# Patient Record
Sex: Female | Born: 1945 | Race: Black or African American | Hispanic: No | Smoking: Former smoker
Health system: Southern US, Community
[De-identification: ages and names within clinical notes are randomized; demographics above are authoritative.]

## PROBLEM LIST (undated history)

## (undated) DIAGNOSIS — H35039 Hypertensive retinopathy, unspecified eye: Secondary | ICD-10-CM

## (undated) DIAGNOSIS — C801 Malignant (primary) neoplasm, unspecified: Secondary | ICD-10-CM

## (undated) DIAGNOSIS — E11319 Type 2 diabetes mellitus with unspecified diabetic retinopathy without macular edema: Secondary | ICD-10-CM

## (undated) DIAGNOSIS — I639 Cerebral infarction, unspecified: Secondary | ICD-10-CM

## (undated) DIAGNOSIS — E119 Type 2 diabetes mellitus without complications: Secondary | ICD-10-CM

## (undated) DIAGNOSIS — C50919 Malignant neoplasm of unspecified site of unspecified female breast: Secondary | ICD-10-CM

## (undated) HISTORY — DX: Cerebral infarction, unspecified: I63.9

## (undated) HISTORY — PX: MASTECTOMY: SHX3

## (undated) HISTORY — PX: CATARACT EXTRACTION: SUR2

## (undated) HISTORY — DX: Type 2 diabetes mellitus with unspecified diabetic retinopathy without macular edema: E11.319

## (undated) HISTORY — DX: Malignant neoplasm of unspecified site of unspecified female breast: C50.919

## (undated) HISTORY — DX: Hypertensive retinopathy, unspecified eye: H35.039

## (undated) HISTORY — PX: EYE SURGERY: SHX253

## (undated) NOTE — *Deleted (*Deleted)
[facility] Note  10/31/2020     CHIEF COMPLAINT Patient presents for No chief complaint on file.   HISTORY OF PRESENT ILLNESS: Desiree Pollard is a 29 y.o. female who presents to the clinic today for:   pt states she had a laser procedure in her right eye for IOP at [facility] on 10.15.21   Referring physician: No referring provider defined for this encounter.  HISTORICAL INFORMATION:   Selected notes from the MEDICAL RECORD NUMBER Referred by Dr. Angelena Form for DM exam LEE: 02.04.20 (A. Hager) [BCVA: OD: 20/400 OS: 20/150] Ocular Hx-ptosis OU, pseudo OU, diabetic ret OU PMH-hx of stroke (1 month), cancer, DM    CURRENT MEDICATIONS: Current Outpatient Medications (Ophthalmic Drugs)  Medication Sig  . dorzolamide-timolol (COSOPT) 22.3-6.8 MG/ML ophthalmic solution INSTILL 1 DROP INTO BOTH EYES TWICE A DAY (Patient taking differently: Place 1 drop into the right eye 2 (two) times daily. )   No current facility-administered medications for this visit. (Ophthalmic Drugs)   Current Outpatient Medications (Other)  Medication Sig  . Accu-Chek FastClix Lancets MISC 1 each by Other route as directed. To test blood glucose daily.  Marland Kitchen atorvastatin (LIPITOR) 80 MG tablet Take 80 mg by mouth daily.   . Blood Glucose Monitoring Suppl (FIFTY50 GLUCOSE METER 2.0) w/Device KIT 1 each by Other route See admin instructions. Use to check blood sugars  . carvedilol (COREG) 25 MG tablet Take 25 mg by mouth 2 (two) times daily with a meal.   . clopidogrel (PLAVIX) 75 MG tablet Take 1 tablet (75 mg total) by mouth daily.  Marland Kitchen donepezil (ARICEPT) 10 MG tablet Take 10 mg by mouth at bedtime.  . Ferrous Sulfate (IRON PO) Take 1 tablet by mouth daily. 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  . ferrous sulfate 325 (65 FE) MG tablet Take 325 mg by mouth daily with breakfast.  . hydrALAZINE (APRESOLINE) 25 MG tablet Take 1 tablet (25 mg total) by mouth every 8 (eight) hours.  . insulin  aspart (NOVOLOG) 100 UNIT/ML injection 0-6 Units, Subcutaneous, 3 times daily with meals CBG < 70: Implement Hypoglycemia measures CBG 70 - 120: 0 units CBG 121 - 150: 0 units CBG 151 - 200: 1 unit CBG 201-250: 2 units CBG 251-300: 3 units CBG 301-350: 4 units CBG 351-400: 5 units CBG > 400: Give 6 units and call MD  . Insulin Pen Needle (FIFTY50 PEN NEEDLES) 32G X 4 MM MISC 1 each by Other route See admin instructions. Use as instructed.  . loperamide (IMODIUM A-D) 2 MG tablet Take 1 tablet (2 mg total) by mouth 4 (four) times daily as needed for diarrhea or loose stools. (Patient not taking: Reported on 10/19/2020)  . NIFEdipine (ADALAT CC) 60 MG 24 hr tablet Take 1 tablet (60 mg total) by mouth daily.  . pantoprazole (PROTONIX) 40 MG tablet Take 1 tablet (40 mg total) by mouth daily.   No current facility-administered medications for this visit. (Other)   REVIEW OF SYSTEMS:  ALLERGIES Allergies  Allergen Reactions  . Amlodipine Nausea And Vomiting    Pt refuses to take due to severe n/v that began after starting amlodipine and stopped after discontinuation.   PAST MEDICAL HISTORY Past Medical History:  Diagnosis Date  . Breast cancer ([place])    2017  . CVA (cerebral vascular accident) ([place]) 11/2018  . Dehydration 07/2020  . Diabetes mellitus without complication ([place])   . Diabetic retinopathy ([place])    PDR OU  .  Frequent diarrhea 07/2020  . Hyperkalemia 07/2020  . Hypertensive retinopathy    OU  . Stroke [facility])    Past Surgical History:  Procedure Laterality Date  . CATARACT EXTRACTION Right   . MASTECTOMY Bilateral    "2017" Left Mastectomy; "2019" Right Mastectomy  . PACEMAKER IMPLANT  2017   FAMILY HISTORY Family History  Problem Relation Age of Onset  . Ovarian cancer Mother   . Heart disease Father   . Diabetes Sister   . Diabetes Brother   . Colon cancer Neg Hx   . Esophageal cancer Neg Hx   . Pancreatic cancer Neg Hx   . Stomach cancer Neg Hx   . Liver  disease Neg Hx    SOCIAL HISTORY Social History   Tobacco Use  . Smoking status: Former Research scientist (life sciences)  . Smokeless tobacco: Never Used  Vaping Use  . Vaping Use: Never used  Substance Use Topics  . Alcohol use: Not Currently  . Drug use: Never         OPHTHALMIC EXAM:  Not recorded     IMAGING AND PROCEDURES  Imaging and Procedures for @TODAY @           ASSESSMENT/PLAN:    ICD-10-CM   1. Proliferative diabetic retinopathy of both eyes with macular edema associated with type 2 diabetes mellitus ([place])  [id number]   2. Retinal edema  H35.81   3. 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 Essential hypertension  I10   5. Hypertensive retinopathy of both eyes  H35.033   6. Pseudophakia of both eyes  Z96.1   7. History of stroke  Z86.73   8. Primary open angle glaucoma (POAG) of right eye, severe stage  H40.1113   9. Ocular hypertension of left eye  H40.052     1,2. Proliferative diabetic retinopathy w/ DME OU  - lost to f/u from 11/2019 to 06/2020 due to living out of the area in [place] and unable to return  - lost to f/u 3.23.2020 to 7.13.2020 due to [place] restrictions  - history of VH OD onset 2.25.20 -- improved  - at initial presentation - difficult exam due to poor patient cooperation -- history of stroke -- improved now  - initial exam showed central edema OU and scattered IRH; +fine NVD OS; early flat NVE OD  - FA (08.19.20) shows NV nasal to disc OD; OS with NV along SN and ST arcades  - s/p PRP OS (02.18.20), fill-in (08.19.20)  - s/p PRP OD (03.23.30), fill-in (09.16.20)  - s/p IVTA OU #1 (02.25.20), #2 (03.23.20) -- lost to f/u due to COVID restrictions  - s/p IVA OU #1 (07.31.20), #2 (09.02.20), #3 (10.07.20), #4 (11.04.20)             - s/p IVA OS #5 (7.27.21)             - s/p IVE OD #1 (12.02.20), #2 (09.22.21), #3 (10.21.21)  - s/p IVE OS #1 (12.02.21), #2 (08.24.21), #3 (09.22.21), #4 (10.21.21)   - IOP 23 OD, 13 OS today -- on  Cosopt and brimonidine TID OD + latanoprost QHS OD  - BCVA: decreased to LP from CF OD, improved to 20/100 from 20/150 OS -- suspect glaucomatous vision loss (OD disc w/ severe pallor)  - OCT shows interval increase in central IRF OD, OS: Interval improvement in subhyaloid heme and vitreous opacities; persistent central vitreous traction  - recommend IVE OD #4 and OS #5 today, 11.18.21  -  pt wishes to proceed  - RBA of procedure discussed, questions answered  - informed consent obtained and signed  - see procedure note -- AC tap OD  - [employer] benefits investigation started 11.4.20 -- approved for 2021  - f/u 4 weeks, DFE, OCT, possible injection  3. VMT OS  - stable VMT, contributing to central edema OS  - monitor  4,5. Hypertensive retinopathy OU  - discussed importance of tight BP control and its relation to 1,2 above  - monitor  6. Pseudophakia OU  - s/p CE/IOL OU  - doing well  - monitor  7. History of stroke  - ischemic stroke on 12/26/2018  - pt reports some visual decline following stroke   8,9. POAG, severe stage OD; Ocular Hypertension OS  - history of steroid response  - under the expert management of [place]  - s/p SLT OD at [facility] 10.15.21  - IOP good at 17 OD today, 10.22.21  - disc OD pale/atrophic and BCVA down to LP  - suspect glaucomatous vision loss since last visit in 2020   - continue Cosopt and brimonidine BID OD  Ophthalmic Meds Ordered this visit:  No orders of the defined types were placed in this encounter.    No follow-ups on file.  There are no Patient Instructions on file for this visit.  Explained the diagnoses, plan, and follow up with the patient and they expressed understanding.  Patient expressed understanding of the importance of proper follow up care.   This document serves as a record of services personally performed by Gardiner Sleeper, MD, PhD. It was created on their behalf by Leonie Douglas, an ophthalmic technician.  The creation of this record is the provider's dictation and/or activities during the visit.    Electronically signed by: Leonie Douglas COA, 10/30/20  10:27 AM   Gardiner Sleeper, M.D., Ph.D. Diseases & Surgery of the Retina and Vitreous [facility]: M myopia (nearsighted); A astigmatism; H hyperopia (farsighted); P presbyopia; Mrx spectacle prescription;  CTL contact lenses; OD right eye; OS left eye; OU both eyes  XT exotropia; ET esotropia; PEK punctate epithelial keratitis; PEE punctate epithelial erosions; DES dry eye syndrome; MGD meibomian gland dysfunction; ATs artificial tears; PFAT's preservative free artificial tears; [place] nuclear sclerotic cataract; PSC posterior subcapsular cataract; ERM epi-retinal membrane; PVD posterior vitreous detachment; RD retinal detachment; DM diabetes mellitus; DR diabetic retinopathy; NPDR non-proliferative diabetic retinopathy; PDR proliferative diabetic retinopathy; CSME clinically significant macular edema; DME diabetic macular edema; dbh dot blot hemorrhages; CWS cotton wool spot; POAG primary open angle glaucoma; C/D cup-to-disc ratio; HVF humphrey visual field; GVF goldmann visual field; OCT optical coherence tomography; IOP intraocular pressure; BRVO Branch retinal vein occlusion; CRVO central retinal vein occlusion; CRAO central retinal artery occlusion; BRAO branch retinal artery occlusion; RT retinal tear; SB scleral buckle; PPV pars plana vitrectomy; VH Vitreous hemorrhage; PRP panretinal laser photocoagulation; IVK intravitreal kenalog; VMT vitreomacular traction; MH Macular hole;  NVD neovascularization of the disc; NVE neovascularization elsewhere; AREDS age related eye disease study; ARMD age related macular degeneration; POAG primary open angle glaucoma; EBMD epithelial/anterior basement membrane dystrophy; ACIOL anterior chamber intraocular lens; IOL intraocular lens; PCIOL posterior chamber intraocular lens;  Phaco/IOL phacoemulsification with intraocular lens placement; [place] photorefractive keratectomy; LASIK laser assisted in situ keratomileusis; HTN hypertension; DM diabetes mellitus; COPD chronic obstructive pulmonary disease

## (undated) NOTE — *Deleted (*Deleted)
[facility] Note  10/31/2020     CHIEF COMPLAINT Patient presents for No chief complaint on file.   HISTORY OF PRESENT ILLNESS: Desiree Pollard is a 85 y.o. female who presents to the clinic today for:   pt states she saw Dr. Katy Fitch on September 1 for glaucoma eval, pt states he is going to refer her to [facility] for surgery, but the appt is not until November 30, pt states her right eye is not painful  Referring physician: No referring provider defined for this encounter.  HISTORICAL INFORMATION:   Selected notes from the MEDICAL RECORD NUMBER Referred by Dr. Angelena Form for DM exam LEE: 02.04.20 (A. Hager) [BCVA: OD: 20/400 OS: 20/150] Ocular Hx-ptosis OU, pseudo OU, diabetic ret OU PMH-hx of stroke (1 month), cancer, DM    CURRENT MEDICATIONS: Current Outpatient Medications (Ophthalmic Drugs)  Medication Sig  . dorzolamide-timolol (COSOPT) 22.3-6.8 MG/ML ophthalmic solution INSTILL 1 DROP INTO BOTH EYES TWICE A DAY (Patient taking differently: Place 1 drop into the right eye 2 (two) times daily. )   No current facility-administered medications for this visit. (Ophthalmic Drugs)   Current Outpatient Medications (Other)  Medication Sig  . Accu-Chek FastClix Lancets MISC 1 each by Other route as directed. To test blood glucose daily.  Marland Kitchen atorvastatin (LIPITOR) 80 MG tablet Take 80 mg by mouth daily.   . Blood Glucose Monitoring Suppl (FIFTY50 GLUCOSE METER 2.0) w/Device KIT 1 each by Other route See admin instructions. Use to check blood sugars  . carvedilol (COREG) 25 MG tablet Take 25 mg by mouth 2 (two) times daily with a meal.   . clopidogrel (PLAVIX) 75 MG tablet Take 1 tablet (75 mg total) by mouth daily.  Marland Kitchen donepezil (ARICEPT) 10 MG tablet Take 10 mg by mouth at bedtime.  . Ferrous Sulfate (IRON PO) Take 1 tablet by mouth daily. 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  . ferrous sulfate 325 (65 FE) MG tablet Take 325 mg by mouth daily with breakfast.  . hydrALAZINE  (APRESOLINE) 25 MG tablet Take 1 tablet (25 mg total) by mouth every 8 (eight) hours.  . insulin aspart (NOVOLOG) 100 UNIT/ML injection 0-6 Units, Subcutaneous, 3 times daily with meals CBG < 70: Implement Hypoglycemia measures CBG 70 - 120: 0 units CBG 121 - 150: 0 units CBG 151 - 200: 1 unit CBG 201-250: 2 units CBG 251-300: 3 units CBG 301-350: 4 units CBG 351-400: 5 units CBG > 400: Give 6 units and call MD  . Insulin Pen Needle (FIFTY50 PEN NEEDLES) 32G X 4 MM MISC 1 each by Other route See admin instructions. Use as instructed.  . loperamide (IMODIUM A-D) 2 MG tablet Take 1 tablet (2 mg total) by mouth 4 (four) times daily as needed for diarrhea or loose stools. (Patient not taking: Reported on 10/19/2020)  . NIFEdipine (ADALAT CC) 60 MG 24 hr tablet Take 1 tablet (60 mg total) by mouth daily.  . pantoprazole (PROTONIX) 40 MG tablet Take 1 tablet (40 mg total) by mouth daily.   No current facility-administered medications for this visit. (Other)   REVIEW OF SYSTEMS:  ALLERGIES Allergies  Allergen Reactions  . Amlodipine Nausea And Vomiting    Pt refuses to take due to severe n/v that began after starting amlodipine and stopped after discontinuation.   PAST MEDICAL HISTORY Past Medical History:  Diagnosis Date  . Breast cancer ([place])    2017  . CVA (cerebral vascular accident) ([place]) 11/2018  . Dehydration  07/2020  . Diabetes mellitus without complication ([place])   . Diabetic retinopathy ([place])    PDR OU  . Frequent diarrhea 07/2020  . Hyperkalemia 07/2020  . Hypertensive retinopathy    OU  . Stroke [facility])    Past Surgical History:  Procedure Laterality Date  . CATARACT EXTRACTION Right   . MASTECTOMY Bilateral    "2017" Left Mastectomy; "2019" Right Mastectomy  . PACEMAKER IMPLANT  2017   FAMILY HISTORY Family History  Problem Relation Age of Onset  . Ovarian cancer Mother   . Heart disease Father   . Diabetes Sister   . Diabetes Brother   . Colon cancer Neg Hx    . Esophageal cancer Neg Hx   . Pancreatic cancer Neg Hx   . Stomach cancer Neg Hx   . Liver disease Neg Hx    SOCIAL HISTORY Social History   Tobacco Use  . Smoking status: Former Research scientist (life sciences)  . Smokeless tobacco: Never Used  Vaping Use  . Vaping Use: Never used  Substance Use Topics  . Alcohol use: Not Currently  . Drug use: Never         OPHTHALMIC EXAM:  Not recorded     IMAGING AND PROCEDURES  Imaging and Procedures for @TODAY @           ASSESSMENT/PLAN:  No diagnosis found.  1,2. Proliferative diabetic retinopathy w/ DME OU  - lost to f/u from 11/2019 to 06/2020 due to living out of the area in [place] and unable to return  - lost to f/u 3.23.2020 to 7.13.2020 due to [place] restrictions  - history of VH OD onset 2.25.20 -- improved  - at initial presentation - difficult exam due to poor patient cooperation -- history of stroke -- improved now  - initial exam showed central edema OU and scattered IRH; +fine NVD OS; early flat NVE OD  - FA (08.19.20) shows NV nasal to disc OD; OS with NV along SN and ST arcades  - s/p PRP OS (02.18.20), fill-in (08.19.20)  - s/p PRP OD (03.23.30), fill-in (09.16.20)  - s/p IVTA OU #1 (02.25.20), #2 (03.23.20) -- lost to f/u due to COVID restrictions  - s/p IVA OU #1 (07.31.20), #2 (09.02.20), #3 (10.07.20), #4 (11.04.20)             - s/p IVA OS #5 (7.27.21)             - s/p IVE OU #1 (12.02.20)  - s/p IVE OS #2 (08.24.21)  - IOP 23 OD, 13 OS today -- on Cosopt and brimonidine TID OD + latanoprost QHS OD  - BCVA: decreased to LP from CF OD, decreased to 20/150-2 from 20/80 OS -- suspect glaucomatous vision loss (OD disc w/ severe pallor)  - OCT shows interval increase in central IRF OD, OS: Interval improvement in subhyaloid heme and IRF; persistent central vitreous traction  - recommend IVE OD #2 and OS #3 today, 09.22.21  - pt wishes to proceed  - RBA of procedure discussed, questions answered  - informed  consent obtained and signed  - see procedure note -- AC tap OD  - [employer] benefits investigation started 11.4.20 -- approved for 2021  - f/u 4 weeks, DFE, OCT, possible injection  3. VMT OS  - stable VMT, contributing to central edema OS  - monitor  4,5. Hypertensive retinopathy OU  - discussed importance of tight BP control and its relation to 1,2 above  - monitor  6. Pseudophakia  OU  - s/p CE/IOL OU  - doing well  - monitor  7. History of stroke  - ischemic stroke on 12/26/2018  - pt reports some visual decline following stroke  8. Ocular Hypertension OU  - history of steroid response  - IOP elevated to 23 OD today, 09.22.21  - disc OD pale/atrophic and BCVA down to LP  - suspect glaucomatous vision loss since last visit in 2020 -- pt has not been on any pressure lowering drops  - restarted Cosopt and brimonidine TID OD, added latanoprost QHS OD  - now under the expert management of [place] -- referral made to Dr. Sharen Counter at [place] Ordered this visit:  No orders of the defined types were placed in this encounter.    No follow-ups on file.  There are no Patient Instructions on file for this visit.  Explained the diagnoses, plan, and follow up with the patient and they expressed understanding.  Patient expressed understanding of the importance of proper follow up care.   This document serves as a record of services personally performed by Gardiner Sleeper, MD, PhD. It was created on their behalf by Roselee Nova, COMT. The creation of this record is the provider's dictation and/or activities during the visit.  Electronically signed by: Roselee Nova, COMT 10/30/20 10:24 AM  This document serves as a record of services personally performed by Gardiner Sleeper, MD, PhD. It was created on their behalf by [place] Jet[place]. Owens Shark, OA an ophthalmic technician. The creation of this record is the provider's dictation and/or activities during the visit.    Electronically  signed by: San Jetty. Owens Shark, [place] 09.22.2021 10:24 AM   Gardiner Sleeper, M.D., Ph.D. Diseases & Surgery of the Retina and Vitreous [facility]  I have reviewed the above documentation for accuracy and completeness, and I agree with the above. Gardiner Sleeper, M.D., Ph.D. 09/04/20 10:24 AM   Abbreviations: M myopia (nearsighted); A astigmatism; H hyperopia (farsighted); P presbyopia; Mrx spectacle prescription;  CTL contact lenses; OD right eye; OS left eye; OU both eyes  XT exotropia; ET esotropia; PEK punctate epithelial keratitis; PEE punctate epithelial erosions; DES dry eye syndrome; MGD meibomian gland dysfunction; ATs artificial tears; PFAT's preservative free artificial tears; [place] nuclear sclerotic cataract; PSC posterior subcapsular cataract; ERM epi-retinal membrane; PVD posterior vitreous detachment; RD retinal detachment; DM diabetes mellitus; DR diabetic retinopathy; NPDR non-proliferative diabetic retinopathy; PDR proliferative diabetic retinopathy; CSME clinically significant macular edema; DME diabetic macular edema; dbh dot blot hemorrhages; CWS cotton wool spot; POAG primary open angle glaucoma; C/D cup-to-disc ratio; HVF humphrey visual field; GVF goldmann visual field; OCT optical coherence tomography; IOP intraocular pressure; BRVO Branch retinal vein occlusion; CRVO central retinal vein occlusion; CRAO central retinal artery occlusion; BRAO branch retinal artery occlusion; RT retinal tear; SB scleral buckle; PPV pars plana vitrectomy; VH Vitreous hemorrhage; PRP panretinal laser photocoagulation; IVK intravitreal kenalog; VMT vitreomacular traction; MH Macular hole;  NVD neovascularization of the disc; NVE neovascularization elsewhere; AREDS age related eye disease study; ARMD age related macular degeneration; POAG primary open angle glaucoma; EBMD epithelial/anterior basement membrane dystrophy; ACIOL anterior chamber intraocular lens; IOL intraocular lens; PCIOL  posterior chamber intraocular lens; Phaco/IOL phacoemulsification with intraocular lens placement; [place] photorefractive keratectomy; LASIK laser assisted in situ keratomileusis; HTN hypertension; DM diabetes mellitus; COPD chronic obstructive pulmonary disease

---

## 2015-12-15 HISTORY — PX: PACEMAKER IMPLANT: EP1218

## 2018-11-13 DIAGNOSIS — I639 Cerebral infarction, unspecified: Secondary | ICD-10-CM

## 2018-11-13 HISTORY — DX: Cerebral infarction, unspecified: I63.9

## 2018-12-30 ENCOUNTER — Emergency Department (HOSPITAL_COMMUNITY)
Admission: EM | Admit: 2018-12-30 | Discharge: 2018-12-31 | Disposition: A | Discharge status: Home / Self Care | Payer: Medicare Other | Attending: Emergency Medicine | Admitting: Emergency Medicine

## 2018-12-30 ENCOUNTER — Other Ambulatory Visit: Payer: Self-pay

## 2018-12-30 ENCOUNTER — Emergency Department (HOSPITAL_COMMUNITY): Payer: Medicare Other

## 2018-12-30 ENCOUNTER — Encounter (HOSPITAL_COMMUNITY): Payer: Self-pay

## 2018-12-30 DIAGNOSIS — E1122 Type 2 diabetes mellitus with diabetic chronic kidney disease: Secondary | ICD-10-CM | POA: Insufficient documentation

## 2018-12-30 DIAGNOSIS — I5032 Chronic diastolic (congestive) heart failure: Secondary | ICD-10-CM | POA: Insufficient documentation

## 2018-12-30 DIAGNOSIS — R278 Other lack of coordination: Secondary | ICD-10-CM | POA: Insufficient documentation

## 2018-12-30 DIAGNOSIS — Z8673 Personal history of transient ischemic attack (TIA), and cerebral infarction without residual deficits: Secondary | ICD-10-CM | POA: Diagnosis not present

## 2018-12-30 DIAGNOSIS — Z950 Presence of cardiac pacemaker: Secondary | ICD-10-CM | POA: Diagnosis not present

## 2018-12-30 DIAGNOSIS — R112 Nausea with vomiting, unspecified: Secondary | ICD-10-CM | POA: Insufficient documentation

## 2018-12-30 DIAGNOSIS — N189 Chronic kidney disease, unspecified: Secondary | ICD-10-CM | POA: Insufficient documentation

## 2018-12-30 DIAGNOSIS — H5500 Unspecified nystagmus: Secondary | ICD-10-CM | POA: Diagnosis not present

## 2018-12-30 DIAGNOSIS — R42 Dizziness and giddiness: Secondary | ICD-10-CM | POA: Insufficient documentation

## 2018-12-30 DIAGNOSIS — I130 Hypertensive heart and chronic kidney disease with heart failure and stage 1 through stage 4 chronic kidney disease, or unspecified chronic kidney disease: Secondary | ICD-10-CM | POA: Diagnosis not present

## 2018-12-30 DIAGNOSIS — Z9013 Acquired absence of bilateral breasts and nipples: Secondary | ICD-10-CM | POA: Insufficient documentation

## 2018-12-30 HISTORY — DX: Type 2 diabetes mellitus without complications: E11.9

## 2018-12-30 HISTORY — DX: Cerebral infarction, unspecified: I63.9

## 2018-12-30 HISTORY — DX: Malignant (primary) neoplasm, unspecified: C80.1

## 2018-12-30 LAB — COMPREHENSIVE METABOLIC PANEL
ALT: 15 U/L (ref 0–44)
AST: 16 U/L (ref 15–41)
Albumin: 2.2 g/dL — ABNORMAL LOW (ref 3.5–5.0)
Alkaline Phosphatase: 73 U/L (ref 38–126)
Anion gap: 11 (ref 5–15)
BUN: 13 mg/dL (ref 8–23)
CHLORIDE: 108 mmol/L (ref 98–111)
CO2: 26 mmol/L (ref 22–32)
Calcium: 9.2 mg/dL (ref 8.9–10.3)
Creatinine, Ser: 1.61 mg/dL — ABNORMAL HIGH (ref 0.44–1.00)
GFR calc Af Amer: 36 mL/min — ABNORMAL LOW (ref >60)
GFR, EST NON AFRICAN AMERICAN: 31 mL/min — AB (ref >60)
Glucose, Bld: 164 mg/dL — ABNORMAL HIGH (ref 70–99)
Potassium: 3.7 mmol/L (ref 3.5–5.1)
Sodium: 145 mmol/L (ref 135–145)
Total Bilirubin: 0.4 mg/dL (ref 0.3–1.2)
Total Protein: 6.7 g/dL (ref 6.5–8.1)

## 2018-12-30 LAB — CBC WITH DIFFERENTIAL/PLATELET
Abs Immature Granulocytes: 0.02 10*3/uL (ref 0.00–0.07)
Basophils Absolute: 0 10*3/uL (ref 0.0–0.1)
Basophils Relative: 0 %
Eosinophils Absolute: 0.1 10*3/uL (ref 0.0–0.5)
Eosinophils Relative: 3 %
HCT: 35.3 % — ABNORMAL LOW (ref 36.0–46.0)
Hemoglobin: 10.5 g/dL — ABNORMAL LOW (ref 12.0–15.0)
Immature Granulocytes: 0 %
Lymphocytes Relative: 19 %
Lymphs Abs: 0.9 10*3/uL (ref 0.7–4.0)
MCH: 24.6 pg — ABNORMAL LOW (ref 26.0–34.0)
MCHC: 29.7 g/dL — ABNORMAL LOW (ref 30.0–36.0)
MCV: 82.9 fL (ref 80.0–100.0)
Monocytes Absolute: 0.4 10*3/uL (ref 0.1–1.0)
Monocytes Relative: 9 %
NEUTROS PCT: 69 %
Neutro Abs: 3.2 10*3/uL (ref 1.7–7.7)
PLATELETS: 242 10*3/uL (ref 150–400)
RBC: 4.26 MIL/uL (ref 3.87–5.11)
RDW: 13.7 % (ref 11.5–15.5)
WBC: 4.7 10*3/uL (ref 4.0–10.5)
nRBC: 0 % (ref 0.0–0.2)

## 2018-12-30 LAB — MAGNESIUM: MAGNESIUM: 2.2 mg/dL (ref 1.7–2.4)

## 2018-12-30 LAB — I-STAT TROPONIN, ED: Troponin i, poc: 0.02 ng/mL (ref 0.00–0.08)

## 2018-12-30 LAB — LIPASE, BLOOD: LIPASE: 25 U/L (ref 11–51)

## 2018-12-30 IMAGING — CT CT HEAD W/O CM
4 series · 15 of 47 positions shown, 17 images · non-contrast
Comparison: CT head [DATE]

CLINICAL DATA: Focal neuro deficit suspect stroke.  Recent stroke.

EXAM:
CT HEAD WITHOUT CONTRAST
TECHNIQUE: Contiguous axial images were obtained from the base of the skull
through the vertex without intravenous contrast.

[Series 3: head without · axial · non-contrast · 0.46mm/px · z∈[-125,+0]mm · 7 of 35 slices shown, 9 images]
[im 5/35  brain]
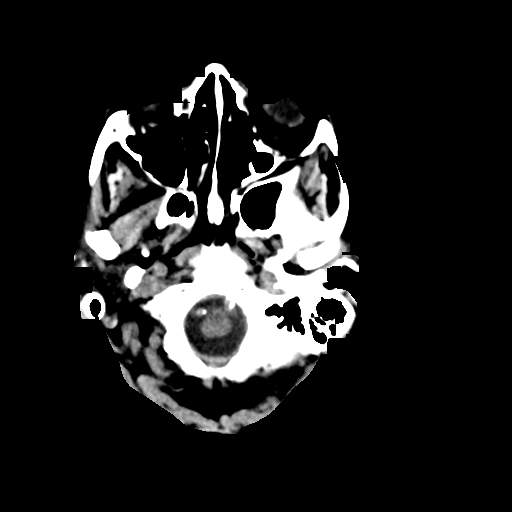
[im 5/35  bone]
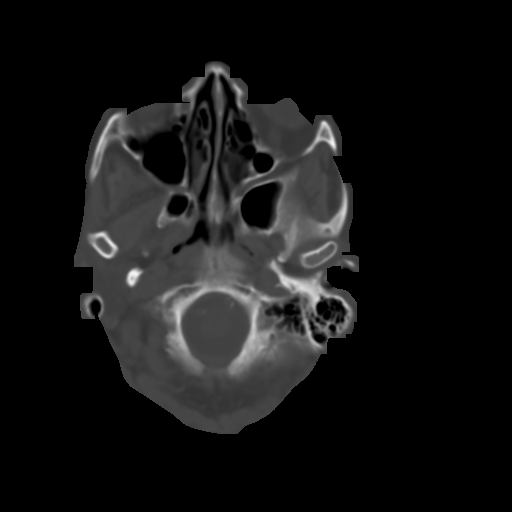
[im 9/35  brain]
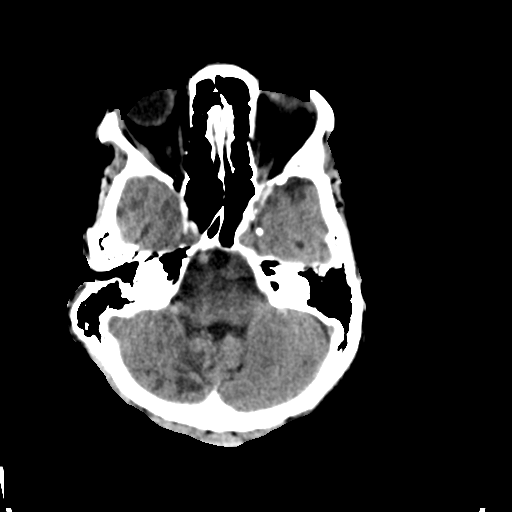
[im 13/35  brain]
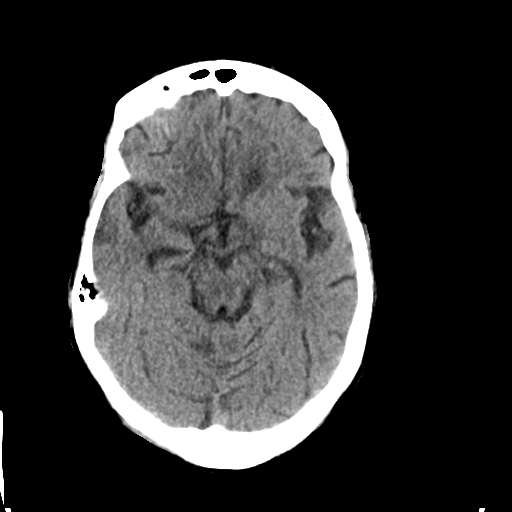
[im 18/35  brain]
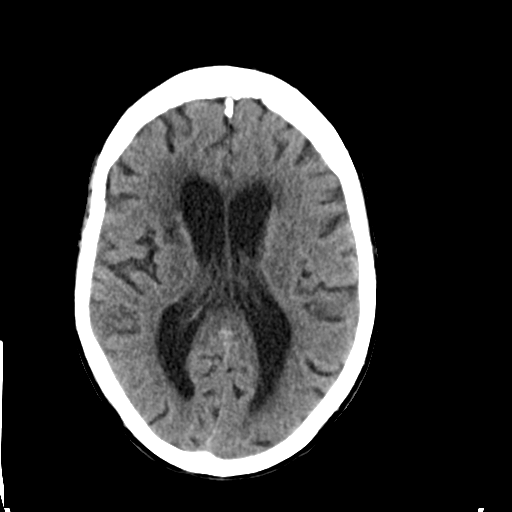
[im 22/35  brain]
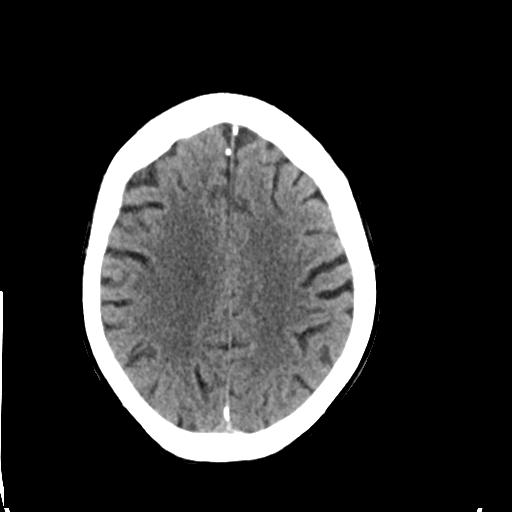
[im 22/35  bone]
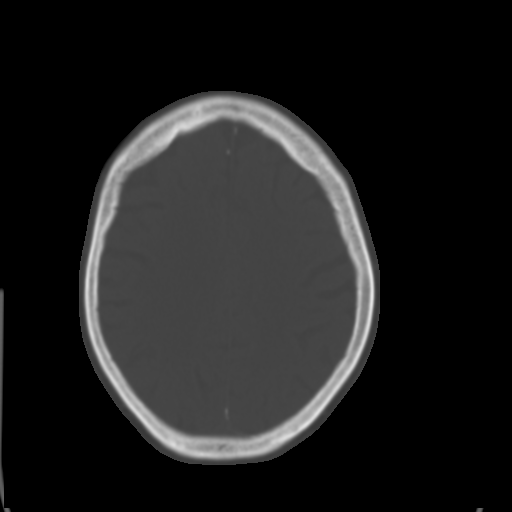
[im 26/35  brain]
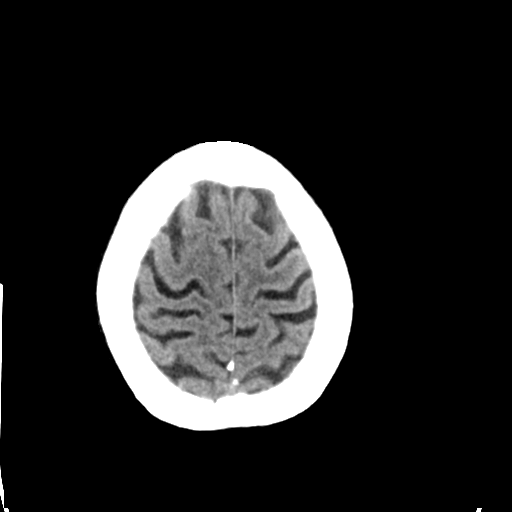
[im 30/35  brain]
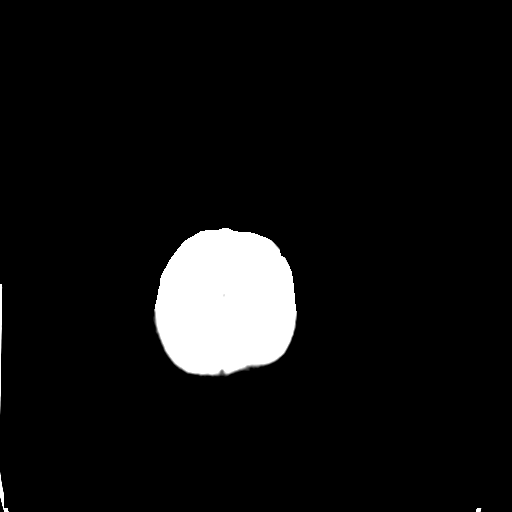

[Series 4: head bone · axial · 0.46mm/px · z∈[-129,-111]mm · 2 of 86 slices shown]
[im 9/86  bone]
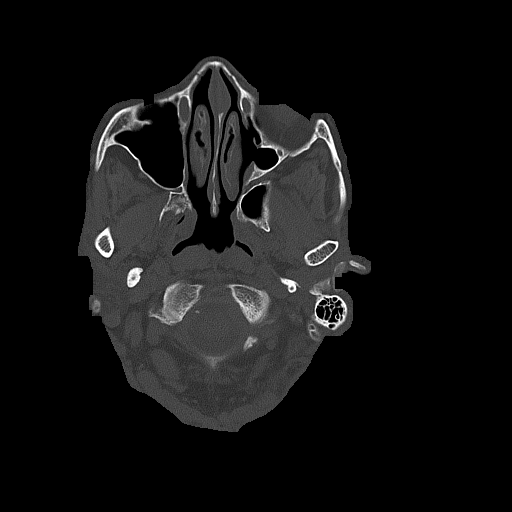
[im 18/86  bone]
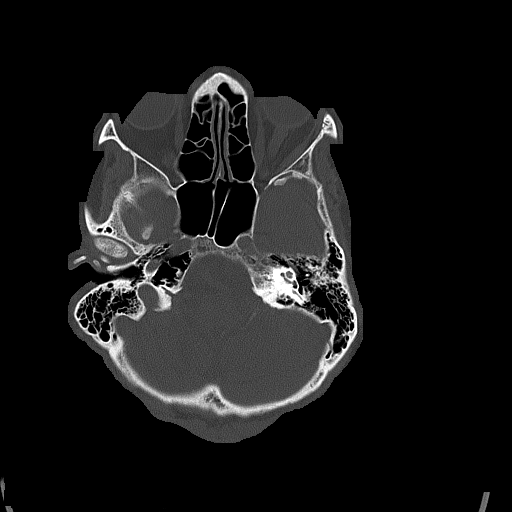

[Series 5: head without cor · coronal · non-contrast · 0.33mm/px · 3 of 84 slices shown]
[im 28/84  brain]
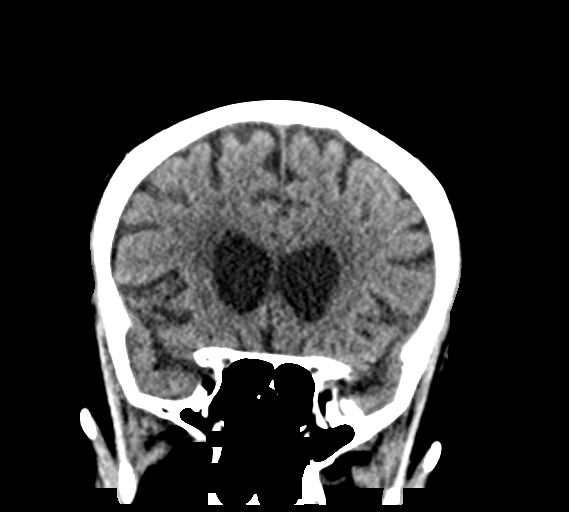
[im 37/84  brain]
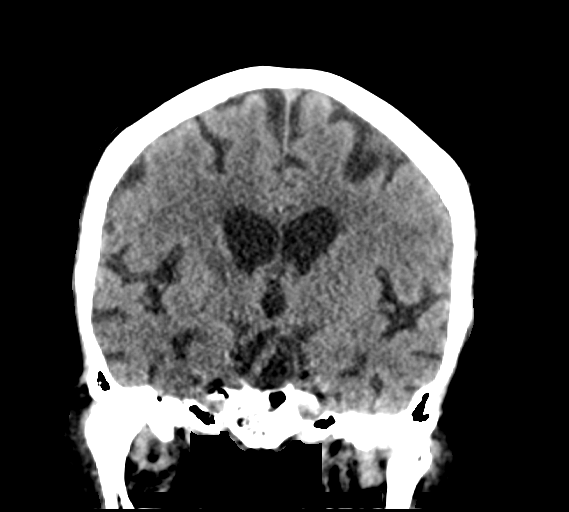
[im 47/84  brain]
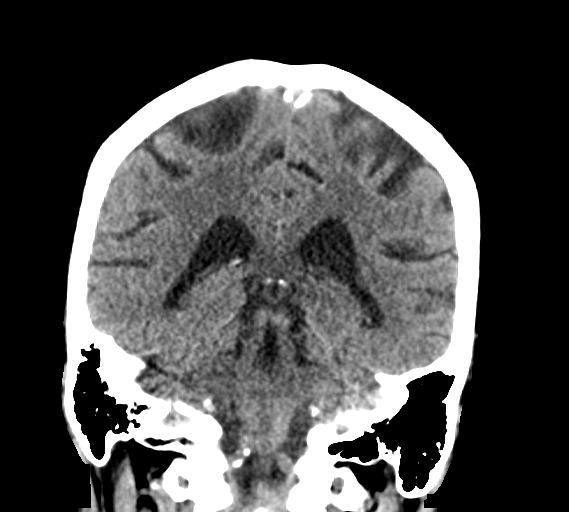

[Series 6: head without sag · sagittal · non-contrast · 0.33mm/px · 3 of 62 slices shown]
[im 21/62  brain]
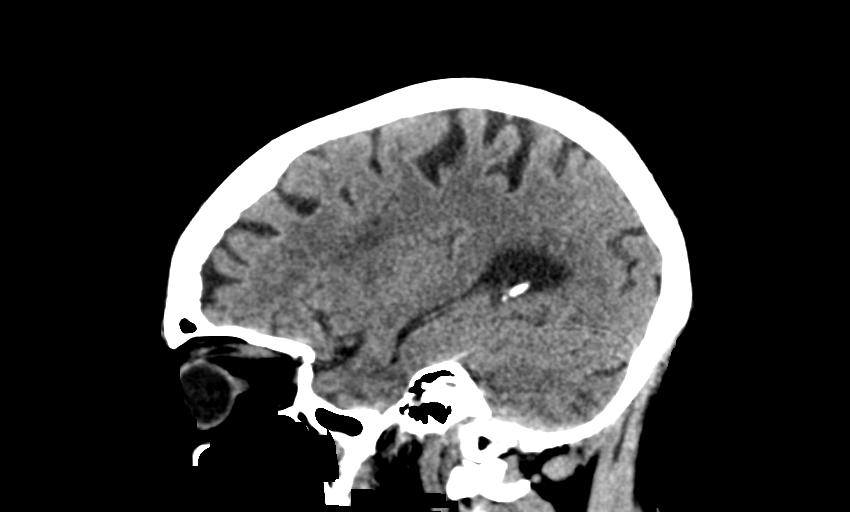
[im 31/62  brain]
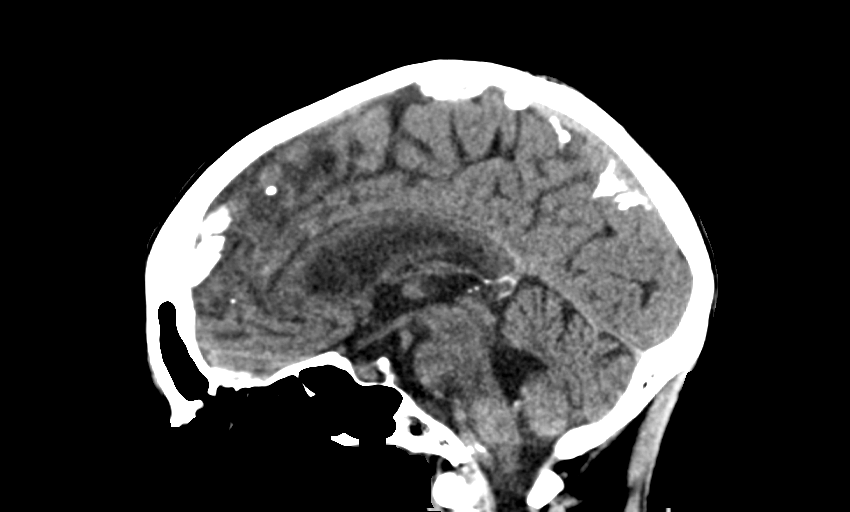
[im 41/62  brain]
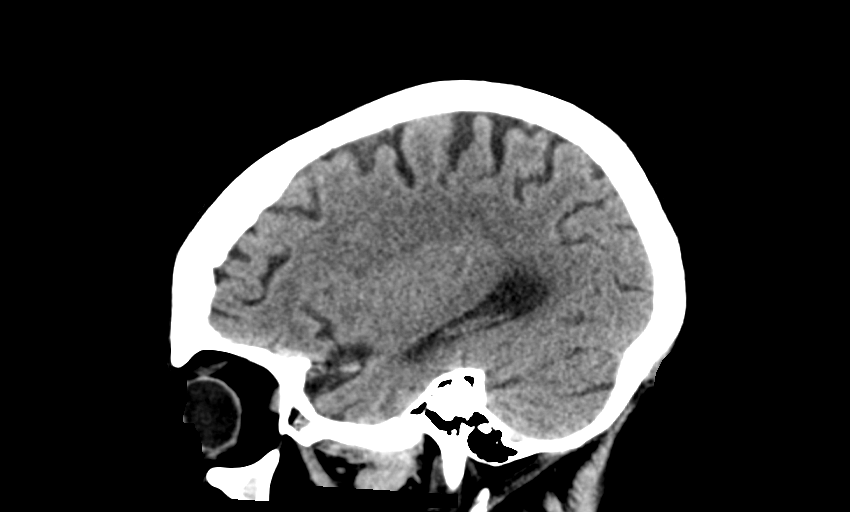

[15 of 47 positions shown; findings below may reference images not displayed]

FINDINGS: Brain: Generalized atrophy. Chronic infarcts in the right basal
ganglia and internal capsule. Chronic infarcts in the cerebellum
bilaterally. Right inferior cerebellar infarct may be recent based
on the prior study.

Negative for acute infarct.  Negative for hemorrhage or mass.

Vascular: Negative for hyperdense vessel

Skull: Negative

Sinuses/Orbits: Chronic mucosal thickening and bony thickening left
maxillary sinus. Bilateral cataract surgery.

Other: None
IMPRESSION: Atrophy and chronic ischemic changes. No acute abnormality. Possible
recent infarct right inferior cerebellum was present on the prior
study.

## 2018-12-30 MED ORDER — INSULIN REGULAR HUMAN 100 UNIT/ML IJ SOLN
0.00 | INTRAMUSCULAR | Status: DC
Start: 2018-12-28 — End: 2018-12-30

## 2018-12-30 MED ORDER — CARVEDILOL 25 MG PO TABS
25.00 | ORAL_TABLET | ORAL | Status: DC
Start: 2018-12-28 — End: 2018-12-30

## 2018-12-30 MED ORDER — SODIUM CHLORIDE 0.9 % IV BOLUS
500.0000 mL | Freq: Once | INTRAVENOUS | Status: AC
  Administered 2018-12-30: 500 mL via INTRAVENOUS

## 2018-12-30 MED ORDER — ENOXAPARIN SODIUM 40 MG/0.4ML ~~LOC~~ SOLN
40.00 | SUBCUTANEOUS | Status: DC
Start: 2018-12-29 — End: 2018-12-30

## 2018-12-30 MED ORDER — FERROUS SULFATE 325 (65 FE) MG PO TABS
325.00 | ORAL_TABLET | ORAL | Status: DC
Start: 2018-12-29 — End: 2018-12-30

## 2018-12-30 MED ORDER — AMLODIPINE BESYLATE 10 MG PO TABS
10.00 | ORAL_TABLET | ORAL | Status: DC
Start: 2018-12-29 — End: 2018-12-30

## 2018-12-30 MED ORDER — MECLIZINE HCL 25 MG PO TABS: 25.0000 mg | ORAL_TABLET | Freq: Three times a day (TID) | ORAL | 0 refills | Status: DC | PRN

## 2018-12-30 MED ORDER — DEXTROSE 50 % IV SOLN
12.50 | INTRAVENOUS | Status: DC
Start: ? — End: 2018-12-30

## 2018-12-30 MED ORDER — INSULIN NPH ISOPHANE & REGULAR (70-30) 100 UNIT/ML ~~LOC~~ SUSP
5.00 | SUBCUTANEOUS | Status: DC
Start: 2018-12-28 — End: 2018-12-30

## 2018-12-30 MED ORDER — ASPIRIN EC 81 MG PO TBEC
81.00 | DELAYED_RELEASE_TABLET | ORAL | Status: DC
Start: 2018-12-29 — End: 2018-12-30

## 2018-12-30 MED ORDER — ATORVASTATIN CALCIUM 40 MG PO TABS
80.00 | ORAL_TABLET | ORAL | Status: DC
Start: 2018-12-28 — End: 2018-12-30

## 2018-12-30 MED ORDER — MECLIZINE HCL 25 MG PO TABS
25.0000 mg | ORAL_TABLET | Freq: Once | ORAL | Status: AC
  Administered 2018-12-30: 25 mg via ORAL
  Filled 2018-12-30: qty 1

## 2018-12-30 MED ORDER — DONEPEZIL HCL 10 MG PO TABS
10.00 | ORAL_TABLET | ORAL | Status: DC
Start: 2018-12-28 — End: 2018-12-30

## 2018-12-30 NOTE — ED Provider Notes (Signed)
[place] EMERGENCY DEPARTMENT Provider Note   CSN: [id number] Arrival date & time: 12/30/18  1704     History   Chief Complaint Chief Complaint  Patient presents with  . Nausea  . Emesis    HPI Desiree Pollard is a 73 y.o. female.  HPI  Desiree Pollard is a 73 y.o. female with PMH of diabetes, chronic diastolic heart failure, hypertension, CKD, anemia who presents with her daughter at the bedside from skilled nursing facility after recent admission to [facility] for stroke.  She reports she has had persistent nausea and vomiting.  Patient presented to [facility] on December 22 with nausea and vomiting as well as confusion.  Daughter reports she was later found to have a CVA and was moved to her skilled nursing facility nearby.  Had decreased LOC at times and nausea and vomiting and was then moved back to the hospital.  Patient's daughter reports that she has had almost daily nausea and nonbloody nonbilious emesis although has been able to tolerate foods at other times since December 22 and 23.  She reports having no pain but does have nausea when she feels lightheaded.  On further discussion she states that she has room spinning sensation often spontaneously as well as when moving.  If she lays down and does not move it does improve sometimes.  At this time, she feels that she has had no new symptoms since her stroke and those symptoms have been stable.  Past Medical History:  Diagnosis Date  . Cancer ([place])   . Diabetes mellitus without complication ([place])   . Stroke [facility])     There are no active problems to display for this patient.   Past Surgical History:  Procedure Laterality Date  . MASTECTOMY Bilateral      OB History   No obstetric history on file.      Home Medications    Prior to Admission medications   Medication Sig Start Date End Date Taking? Authorizing Provider  meclizine (ANTIVERT) 25 MG tablet Take 1 tablet (25 mg total) by mouth 3  (three) times daily as needed for dizziness. 12/30/18   Valissa Lyvers, Rodena Goldmann, MD    Family History No family history on file.  Social History Social History   Tobacco Use  . Smoking status: Not on file  Substance Use Topics  . Alcohol use: Not on file  . Drug use: Not on file     Allergies   Patient has no allergy information on record.   Review of Systems Review of Systems  Constitutional: Negative for chills and fever.  HENT: Negative for ear pain and sore throat.   Eyes: Negative for pain and visual disturbance.  Respiratory: Negative for cough and shortness of breath.   Cardiovascular: Negative for chest pain and palpitations.  Gastrointestinal: Negative for abdominal pain and vomiting.  Genitourinary: Negative for dysuria and hematuria.  Musculoskeletal: Negative for arthralgias and back pain.  Skin: Negative for color change and rash.  Neurological: Positive for dizziness, speech difficulty, weakness and light-headedness. Negative for tremors, seizures, syncope and numbness.  All other systems reviewed and are negative.    Physical Exam Updated Vital Signs BP (!) 148/64   Pulse 60   Resp 15   Ht 5\' 7"  (1.702 m)   Wt 58.1 kg   SpO2 99%   BMI 20.05 kg/m   Physical Exam Vitals signs and nursing note reviewed.  Constitutional:      General: She  is not in acute distress.    Appearance: She is well-developed. She is ill-appearing.  HENT:     Head: Normocephalic and atraumatic.  Eyes:     General: Lids are normal. Vision grossly intact. Gaze aligned appropriately. No visual field deficit.    Extraocular Movements:     Right eye: Nystagmus present. Normal extraocular motion.     Left eye: Nystagmus present. Normal extraocular motion.     Conjunctiva/sclera: Conjunctivae normal.     Comments: Bidirectional horizontal nystagmus and rotary nystagmus toward the left in both eyes.  Neck:     Musculoskeletal: Neck supple.  Cardiovascular:     Rate and Rhythm:  Normal rate and regular rhythm.     Heart sounds: No murmur.  Pulmonary:     Effort: Pulmonary effort is normal. No respiratory distress.     Breath sounds: Normal breath sounds.  Abdominal:     Palpations: Abdomen is soft.     Tenderness: There is no abdominal tenderness.  Skin:    General: Skin is warm and dry.  Neurological:     Mental Status: She is alert and oriented to person, place, and time.     GCS: GCS eye subscore is 4. GCS verbal subscore is 5. GCS motor subscore is 6.     Cranial Nerves: Dysarthria present. No facial asymmetry.     Sensory: Sensation is intact.     Motor: Weakness present.     Coordination: Coordination abnormal. Finger-Nose-Finger Test abnormal.     Comments: Strength 4 out of 5 in the right upper extremity with dysmetria and dysdiadochokinesia of the right arm.  Psychiatric:        Behavior: Behavior is cooperative.      ED Treatments / Results  Labs (all labs ordered are listed, but only abnormal results are displayed) Labs Reviewed  CBC WITH DIFFERENTIAL/PLATELET - Abnormal; Notable for the following components:      Result Value   Hemoglobin 10.5 (*)    HCT 35.3 (*)    MCH 24.6 (*)    MCHC 29.7 (*)    All other components within normal limits  COMPREHENSIVE METABOLIC PANEL - Abnormal; Notable for the following components:   Glucose, Bld 164 (*)    Creatinine, Ser 1.61 (*)    Albumin 2.2 (*)    GFR calc non Af Amer 31 (*)    GFR calc Af Amer 36 (*)    All other components within normal limits  MAGNESIUM  LIPASE, BLOOD  URINALYSIS, ROUTINE W REFLEX MICROSCOPIC  I-STAT TROPONIN, ED    EKG EKG Interpretation  Date/Time:  Friday December 30 2018 17:16:22 EST Ventricular Rate:  60 PR Interval:    QRS Duration: 162 QT Interval:  495 QTC Calculation: 495 R Axis:   -72 Text Interpretation:  Sinus rhythm Probable left atrial enlargement Left bundle branch block No STEMI.  Confirmed by Nanda Quinton [phone]) on 12/30/2018 5:37:07  PM   Radiology Ct Head Wo Contrast  Result Date: 12/30/2018 CLINICAL DATA:  Focal neuro deficit suspect stroke.  Recent stroke. EXAM: CT HEAD WITHOUT CONTRAST TECHNIQUE: Contiguous axial images were obtained from the base of the skull through the vertex without intravenous contrast. COMPARISON:  CT head 12/26/2018 FINDINGS: Brain: Generalized atrophy. Chronic infarcts in the right basal ganglia and internal capsule. Chronic infarcts in the cerebellum bilaterally. Right inferior cerebellar infarct may be recent based on the prior study. Negative for acute infarct.  Negative for hemorrhage or mass. Vascular: Negative for hyperdense  vessel Skull: Negative Sinuses/Orbits: Chronic mucosal thickening and bony thickening left maxillary sinus. Bilateral cataract surgery. Other: None IMPRESSION: Atrophy and chronic ischemic changes. No acute abnormality. Possible recent infarct right inferior cerebellum was present on the prior study. Electronically Signed   By: Franchot Gallo M.D.   On: 12/30/2018 18:56    Procedures Procedures (including critical care time)  Medications Ordered in ED Medications  sodium chloride 0.9 % bolus 500 mL (has no administration in time range)  meclizine (ANTIVERT) tablet 25 mg (25 mg Oral Given 12/30/18 1749)     Initial Impression / Assessment and Plan / ED Course  I have reviewed the triage vital signs and the nursing notes.  Pertinent labs & imaging results that were available during my care of the patient were reviewed by me and considered in my medical decision making (see chart for details).     MDM:  Imaging: CT head shows atrophy and chronic ischemic changes.  No acute abnormality.  Possible recent infarct right inferior cerebellum present on prior study.  ED Provider Interpretation of EKG: What appears to be atrial paced rhythm at 60 bpm, left axis deviation, no significant ST segment elevation or depression.  Labs: CBC unremarkable, CMP with creatinine 1.6  (prior ranging from 1.3-1.6 at facility during recent hospitalization) and BUN 13, CO2 26, sodium and potassium normal, troponin 0.02, mag 2.2, lipase 25  On initial evaluation, patient appears stable. Afebrile and hemodynamically stable. Alert and oriented x4, pleasant, and cooperative.  Presents with nausea and vomiting as well as lightheadedness/dizziness as detailed above.  Chart review shows patient was found to have subacute right caudate head lacunar infarct as well as subacute cerebellar infarct at [facility].  The symptoms described today as well as my physical exam findings above appear to be documented in their discharge summary as well.  Carotid Dopplers without significant stenosis.  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  They attributed her nausea and vomiting to CVA as well.  Treated with Zofran early but given prolonged QTC it appears they switched her to PRN Ativan p.o.  Patient and daughter did not feel strongly that this improved her symptoms.  On exam, patient has dysmetria and dysdiadochokinesia with mild right arm weakness.  Unchanged from prior documentation and patient's subjective report.  She has  bidirectional horizontal nystagmus as well as rotary nystagmus of both eyes.  This appears to be a mixed picture.  Normal test of skew.  Symptoms are brought on by Dix-Hallpike maneuver but were not fatigable.  After resting her symptoms did resolve spontaneously.  There is a mixed picture of peripheral versus central etiology, however, given her recent stroke and its location suspect a large central cause.  Repeat CT head here consistent with known cerebellar infarct and other chronic ischemic changes.  Patient has CKD with GFR of 31 and therefore CTA withheld.  Carotid Dopplers and outside as above are normal.  Feel that it is okay to hold CTA at this time.  Patient was given p.o. meclizine in the ED for her nausea and vomiting with complete resolution of her  lightheadedness and vertigo.  Her exam is consistent with a mixed central and peripheral cause but she responded well to general treatment for peripheral vertigo.  Has pathology that would explain her central symptoms as well.  She has normal BUN and has no evidence for metabolic acidosis.  Creatinine elevated slightly at 1.6 but this was seen at outside facility as well and responded well  to IV fluids.  Given 500 mL of IV normal saline.    As patient is having no additional lightheadedness or vertigo or nausea or vomiting in the ED for about 2 to 3 hours after receiving meclizine the patient is appropriate for discharge back to her facility where they will continue to monitor her.  She was discharged home with a prescription for as needed meclizine.  I spoke to the patient and her sister extensively at the bedside and they agreed with the plan.  There is no evidence for additional pathology including intra-abdominal processes such as pancreatitis or obstruction.  Low suspicion for acute infectious etiology.  Patient stable for discharge to facility with outpatient follow-up.  Given return precautions.   The plan for this patient was discussed with Dr. Laverta Baltimore who voiced agreement and who oversaw evaluation and treatment of this patient.   The patient was fully informed and involved with the history taking, evaluation, workup including labs/images, and plan. The patient's concerns and questions were addressed to the patient's satisfaction and she expressed agreement with the plan to DC home.    Final Clinical Impressions(s) / ED Diagnoses   Final diagnoses:  Non-intractable vomiting with nausea, unspecified vomiting type  Vertigo    ED Discharge Orders         Ordered    meclizine (ANTIVERT) 25 MG tablet  3 times daily PRN     12/30/18 2124           Evamae Rowen, Rodena Goldmann, MD 12/30/18 2133    Margette Fast, MD 12/31/18 [phone]

## 2018-12-30 NOTE — ED Notes (Signed)
[facility] called for transport.  

## 2018-12-30 NOTE — ED Triage Notes (Signed)
Pt brought in by EMS from [facility] for [facility] x3 weeks. Pt is at the rehab facility following a stroke x3 weeks ago. Pt seen at [facility] x2, diagnosed with dehydration both times. Family not happy with this diagnosis and would like a second opinion. Pt currently has no complaints.

## 2018-12-30 NOTE — ED Notes (Signed)
Patient transported to CT 

## 2018-12-31 LAB — CBG MONITORING, ED: Glucose-Capillary: 90 mg/dL (ref 70–99)

## 2019-01-30 NOTE — Progress Notes (Addendum)
[facility] Note  01/31/2019     CHIEF COMPLAINT Patient presents for Retina Evaluation and Diabetic Eye Exam   HISTORY OF PRESENT ILLNESS: Desiree Pollard is a 73 y.o. female who presents to the clinic today for:   HPI    Retina Evaluation    In both eyes.  This started 1 month ago.  Associated Symptoms Floaters.  Negative for Flashes, Pain, Trauma, Fever, Weight Loss, Scalp Tenderness, Redness, Distortion, Photophobia, Jaw Claudication, Fatigue, Shoulder/Hip pain, Glare and Blind Spot.  Context:  distance vision, mid-range vision and near vision.  Treatments tried include surgery.  Response to treatment was moderate improvement.  I, the attending physician,  performed the HPI with the patient and updated documentation appropriately.          Diabetic Eye Exam    Vision is stable.  Associated Symptoms Negative for Flashes, Pain, Trauma, Fever, Weight Loss, Scalp Tenderness, Redness, Floaters, Distortion, Photophobia, Jaw Claudication, Fatigue, Shoulder/Hip pain, Glare and Blind Spot.  Diabetes characteristics include Type 2 and on insulin.  This started 5 years ago.  Blood sugar level is controlled.  Last Blood Glucose 129.  I, the attending physician,  performed the HPI with the patient and updated documentation appropriately.          Comments    Referral OF DR. Hager DME. Patient is accompanied by sister today, patient states after having a stroke a (12/04/18) , she had floaters and blurred vision, she is DM2 x 5-6 yrs, BS have been stable while in rehab ctr, BS 129( before lunch today) A1C (unknown), Pt is on Insulin. Pt reports she had cataract sx ou 5-6 yrs ago. Denies gtt's       Last edited by Desiree Caffey, MD on 01/31/2019  1:43 PM. (History)    pt is with her sister today, pt was referred here by Dr. Angelena Pollard, pts sister states she saw him for routine eye exam, pt had a stroke about a month ago, pt states her vision problems started mainly  after the stroke, pts sister states her BP was 200-something when pt went to hospital for stroke, pts sister states pt was taken off of 2 bp meds bc the dr said they were too strong, she states that every time pt stood up her blood pressure would drop and pt would almost pass out, pt states a dr in [place] did her cataract sx, pt is unsure if she ever saw a retina dr there, pt is still living in a rehab facility recovering from the stroke    Referring physician: Phylliss Pollard, OD [address], [place] [address]  HISTORICAL INFORMATION:   Selected notes from the MEDICAL RECORD NUMBER Referred by Dr. Angelena Pollard for DM exam LEE: 02.04.20 (Desiree Pollard) [BCVA: OD: 20/400 OS: 20/150] Ocular Hx-ptosis OU, pseudo OU, diabetic ret OU PMH-hx of stroke (1 month), cancer, DM    CURRENT MEDICATIONS: Current Outpatient Medications (Ophthalmic Drugs)  Medication Sig  . prednisoLONE acetate (PRED FORTE) 1 % ophthalmic suspension Place 1 drop into the left eye 4 (four) times daily for 7 days.   No current facility-administered medications for this visit.  (Ophthalmic Drugs)   Current Outpatient Medications (Other)  Medication Sig  . aspirin EC 81 MG tablet Take by mouth.  Marland Kitchen atorvastatin (LIPITOR) 80 MG tablet Take by mouth.  . Blood Glucose Monitoring Suppl (FIFTY50 GLUCOSE METER 2.0) w/Device KIT Use to check blood sugars. Freestyle  glucose meter. Okay to substitute for other meter preferred by insurance. ICD-10 E11.9.  Marland Kitchen carvedilol (COREG) 25 MG tablet Take by mouth.  . donepezil (ARICEPT) 10 MG tablet Take by mouth.  . ferrous sulfate 325 (65 FE) MG tablet Take by mouth.  Marland Kitchen glucose blood ([employer] TEST STRIPS) test strip Use to check blood sugars 3 times daily. Freestyle test strips. Ok to substitute for other strips as preferred by insurance. ICD-10 E11.9.  . insulin lispro (HUMALOG) 100 UNIT/ML injection Inject into the skin.  Marland Kitchen insulin NPH-regular Human (70-30) 100 UNIT/ML injection Inject  into the skin.  Marland Kitchen lisinopril (PRINIVIL,ZESTRIL) 20 MG tablet Take 40 mg by mouth daily.  . meclizine (ANTIVERT) 25 MG tablet Take 1 tablet (25 mg total) by mouth 3 (three) times daily as needed for dizziness.   No current facility-administered medications for this visit.  (Other)      REVIEW OF SYSTEMS: ROS    Positive for: Endocrine, Eyes   Negative for: Constitutional, Gastrointestinal, Neurological, Skin, Genitourinary, Musculoskeletal, HENT, Cardiovascular, Respiratory, Psychiatric, Allergic/Imm, Heme/Lymph   Last edited by Desiree Jordan, LPN on 06/04/6332 54:56 PM. (History)       ALLERGIES No Known Allergies  PAST MEDICAL HISTORY Past Medical History:  Diagnosis Date  . Cancer ([place])   . CVA (cerebral vascular accident) ([place])   . Diabetes mellitus without complication ([place])   . Stroke [facility])    Past Surgical History:  Procedure Laterality Date  . CATARACT EXTRACTION    . EYE SURGERY    . MASTECTOMY Bilateral     FAMILY HISTORY History reviewed. No pertinent family history.  SOCIAL HISTORY Social History   Tobacco Use  . Smoking status: Former Research scientist (life sciences)  . Smokeless tobacco: Never Used  Substance Use Topics  . Alcohol use: Not Currently  . Drug use: Never         OPHTHALMIC EXAM:  Base Eye Exam    Visual Acuity (Snellen - Linear)      Right Left   Dist [place] 20/200 -1 20/100 -2   Dist ph [place] NI NI       Tonometry (Tonopen, 1:03 PM)      Right Left   Pressure 16 18       Pupils      Dark Light Shape React APD   Right 3 2 Round Slow None   Left 3 2 Round Slow None       Visual Fields      Left Right     Full   Restrictions Partial outer inferior temporal deficiency        Extraocular Movement      Right Left    Full, Ortho Full, Ortho       Neuro/Psych    Oriented x3:  Yes       Dilation    Both eyes:  1.0% Mydriacyl, 2.5% Phenylephrine @ 1:00 PM        Slit Lamp and Fundus Exam    Slit Lamp Exam      Right Left   Lids/Lashes  Dermatochalasis - upper lid Dermatochalasis - upper lid, Meibomian gland dysfunction   Conjunctiva/Sclera nasal and temporal Pinguecula, Melanosis nasal and temporal Pinguecula, Melanosis   Cornea mild Arcus, Well healed cataract wounds mild Arcus, Well healed cataract wounds   Anterior Chamber deep and clear deep and clear   Iris Round and dilated, No NVI Round and dilated, No NVI   Lens Posterior chamber intraocular lens, trace Posterior capsular opacification  Posterior chamber intraocular lens, trace Posterior capsular opacification   Vitreous Vitreous syneresis Vitreous syneresis       Fundus Exam      Right Left   Disc mild Pallor fine NVD, sharp rim   C/D Ratio 0.2 0.2   Macula Scattered IRH/exudate, +edema Blunted foveal reflex, Epiretinal membrane, scattered IRH/exudates   Vessels Vascular attenuation Vascular attenuation, early NV   Periphery Attached, ?early NV SN quadrant, scattered DBH Attached, NV SN quadrant          IMAGING AND PROCEDURES  Imaging and Procedures for @TODAY @  OCT, Retina - OU - Both Eyes       Right Eye Quality was good. Central Foveal Thickness: 342. Progression has no prior data. Findings include no SRF, abnormal foveal contour, subretinal fluid (+DME).   Left Eye Quality was good. Central Foveal Thickness: 458. Progression has no prior data. Findings include abnormal foveal contour, subretinal fluid, intraretinal fluid, epiretinal membrane.   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 CME - cystoid macular edema. PED - pigment epithelial detachment. IRF - intraretinal fluid. SRF - subretinal fluid. EZ - ellipsoid zone. ERM - epiretinal membrane. ORA - outer retinal atrophy. ORT - outer retinal tubulation. SRHM - subretinal hyper-reflective material        Panretinal  Photocoagulation - OS - Left Eye       LASER PROCEDURE NOTE  Diagnosis:   Proliferative Diabetic Retinopathy, LEFT EYE  Procedure:  Pan-retinal photocoagulation using slit lamp laser, LEFT EYE  Anesthesia:  Topical  Surgeon: Desiree Caffey, MD, PhD   Informed consent obtained, operative eye marked, and time out performed prior to initiation of laser.   Lumenis [id number] slit lamp laser Pattern: 3x3 square Power: 280 mW Duration: 30 msec  Spot size: 200 microns  # spots: [phone] spots  Complications: None.  Notes: Pt with difficulty holding position  RTC: 1-2 wks for IVTA OS, PRP OD  Patient tolerated the procedure well and received written and verbal post-procedure care information/education.                  ASSESSMENT/PLAN:    ICD-10-CM   1. Proliferative diabetic retinopathy of both eyes with macular edema associated with type 2 diabetes mellitus ([place]) L79.8921 Panretinal Photocoagulation - OS - Left Eye  2. Retinal edema H35.81 OCT, Retina - OU - Both Eyes  3. Essential hypertension I10   4. Hypertensive retinopathy of both eyes H35.033   5. Pseudophakia of both eyes Z96.1   6. History of stroke Z86.73     1,2. Proliferative diabetic retinopathy w/ DME OU  - The incidence, risk factors for progression, natural history and treatment options for diabetic retinopathy were discussed with patient.    - The need for close monitoring of blood glucose, blood pressure, and serum lipids, avoiding cigarette or any type of tobacco, and the need for long term follow up was also discussed with patient.  - difficult exam due to poor patient cooperation -- history of recent stroke -- but central edema OU and scattered IRH; +fine NVD OS; early flat NVE OD  - FA - unable to obtain today, 02.18.20  - OCT shows diabetic macular edema, OU   - The natural history, pathology, and characteristics of diabetic macular edema discussed with patient.  A generalized discussion of the  major clinical trials concerning treatment of diabetic macular edema (ETDRS, DCT, SCORE, RISE / RIDE, and ongoing DRCR net studies) was completed.  This discussion included mention of the various approaches to treating diabetic macular edema (observation, laser photocoagulation, anti-VEGF injections with lucentis / Avastin / Eylea, steroid injections with Kenalog / Ozurdex, and intraocular surgery with vitrectomy).  The goal hemoglobin A1C of 6-7 was discussed, as well as importance of smoking cessation and hypertension control.  Need for ongoing treatment and monitoring were specifically discussed with reference to chronic nature of diabetic macular edema.  - discussed findings, prognosis and treatment options for DME and proliferative diabetic retinopathy  - in particular, discussed that given recent stroke, anti-VEGF agents should be avoided  - also discussed side effects of intravitreal steroids for DME -- glaucoma, cataract progression  - recommend PRP OS today, 02.18.2020 for PDR / NVD  - pt wishes to proceed with laser  - RBA of procedure discussed, questions answered  - informed consent obtained and signed  - see procedure note  - start PF OS QID x7 days  - f/u in 1-2 wks, IVTA OS, PRP OD  3,4. Hypertensive retinopathy OU - discussed importance of tight BP control and its relation to 1,2 above - monitor  5. Pseudophakia OU  - s/p CE/IOL OU  - doing well  - monitor  6. History of recent stroke - ischemic stroke on 12/26/2018 - pt reports some visual decline following stroke   Ophthalmic Meds Ordered this visit:  Meds ordered this encounter  Medications  . prednisoLONE acetate (PRED FORTE) 1 % ophthalmic suspension    Sig: Place 1 drop into the left eye 4 (four) times daily for 7 days.    Dispense:  10 mL    Refill:  0       Return for f/u 1-2 weeks, DME OU, PRP OD.  There are no Patient Instructions on file for this visit.   Explained the diagnoses, plan, and follow up  with the patient and they expressed understanding.  Patient expressed understanding of the importance of proper follow up care.   This document serves as a record of services personally performed by Gardiner Sleeper, MD, PhD. It was created on their behalf by Ernest Mallick, OA, an ophthalmic assistant. The creation of this record is the provider's dictation and/or activities during the visit.    Electronically signed by: Ernest Mallick, OA  02.17.2020 11:16 PM    Gardiner Sleeper, M.D., Ph.D. Diseases & Surgery of the Retina and Vitreous [facility]   I have reviewed the above documentation for accuracy and completeness, and I agree with the above. Gardiner Sleeper, M.D., Ph.D. 01/31/19 11:16 PM     Abbreviations: M myopia (nearsighted); A astigmatism; H hyperopia (farsighted); P presbyopia; Mrx spectacle prescription;  CTL contact lenses; OD right eye; OS left eye; OU both eyes  XT exotropia; ET esotropia; PEK punctate epithelial keratitis; PEE punctate epithelial erosions; DES dry eye syndrome; MGD meibomian gland dysfunction; ATs artificial tears; PFAT's preservative free artificial tears; [place] nuclear sclerotic cataract; PSC posterior subcapsular cataract; ERM epi-retinal membrane; PVD posterior vitreous detachment; RD retinal detachment; DM diabetes mellitus; DR diabetic retinopathy; NPDR non-proliferative diabetic retinopathy; PDR proliferative diabetic retinopathy; CSME clinically significant macular edema; DME diabetic macular edema; dbh dot blot hemorrhages; CWS cotton wool spot; POAG primary open angle glaucoma; C/D cup-to-disc ratio; HVF humphrey visual field; GVF goldmann visual field; OCT optical coherence tomography; IOP intraocular pressure; BRVO  Branch retinal vein occlusion; CRVO central retinal vein occlusion; CRAO central retinal artery occlusion; BRAO branch retinal artery occlusion; RT retinal tear; SB scleral buckle; PPV pars plana vitrectomy; VH Vitreous  hemorrhage; PRP panretinal laser photocoagulation; IVK intravitreal kenalog; VMT vitreomacular traction; MH Macular hole;  NVD neovascularization of the disc; NVE neovascularization elsewhere; AREDS age related eye disease study; ARMD age related macular degeneration; POAG primary open angle glaucoma; EBMD epithelial/anterior basement membrane dystrophy; ACIOL anterior chamber intraocular lens; IOL intraocular lens; PCIOL posterior chamber intraocular lens; Phaco/IOL phacoemulsification with intraocular lens placement; [place] photorefractive keratectomy; LASIK laser assisted in situ keratomileusis; HTN hypertension; DM diabetes mellitus; COPD chronic obstructive pulmonary disease

## 2019-01-31 ENCOUNTER — Encounter (INDEPENDENT_AMBULATORY_CARE_PROVIDER_SITE_OTHER): Payer: Self-pay | Admitting: Ophthalmology

## 2019-01-31 ENCOUNTER — Ambulatory Visit (INDEPENDENT_AMBULATORY_CARE_PROVIDER_SITE_OTHER): Payer: Medicare Other | Admitting: Ophthalmology

## 2019-01-31 DIAGNOSIS — I10 Essential (primary) hypertension: Secondary | ICD-10-CM

## 2019-01-31 DIAGNOSIS — Z961 Presence of intraocular lens: Secondary | ICD-10-CM

## 2019-01-31 DIAGNOSIS — H3581 Retinal edema: Secondary | ICD-10-CM

## 2019-01-31 DIAGNOSIS — Z8673 Personal history of transient ischemic attack (TIA), and cerebral infarction without residual deficits: Secondary | ICD-10-CM

## 2019-01-31 DIAGNOSIS — H35033 Hypertensive retinopathy, bilateral: Secondary | ICD-10-CM | POA: Diagnosis not present

## 2019-01-31 DIAGNOSIS — E113513 Type 2 diabetes mellitus with proliferative diabetic retinopathy with macular edema, bilateral: Secondary | ICD-10-CM

## 2019-01-31 MED ORDER — PREDNISOLONE ACETATE 1 % OP SUSP: 1.0000 [drp] | Freq: Four times a day (QID) | OPHTHALMIC | 0 refills | Status: AC

## 2019-02-06 NOTE — Progress Notes (Addendum)
[facility] Note  02/07/2019     CHIEF COMPLAINT Patient presents for Retina Follow Up   HISTORY OF PRESENT ILLNESS: Desiree Pollard is a 73 y.o. female who presents to the clinic today for:   HPI    Retina Follow Up    Patient presents with  Diabetic Retinopathy.  In both eyes.  Severity is severe.  Duration of 1 week.  I, the attending physician,  performed the HPI with the patient and updated documentation appropriately.          Comments    Patient states vision the same OU.       Last edited by Bernarda Caffey, MD on 02/07/2019 11:11 AM. (History)    pt states she could not tell that her vision has gone down in her right eye or that she has new floaters, pt states her blood pressure has not been running high  Referring physician: No referring provider defined for this encounter.  HISTORICAL INFORMATION:   Selected notes from the MEDICAL RECORD NUMBER Referred by Dr. Angelena Form for DM exam LEE: 02.04.20 (A. Hager) [BCVA: OD: 20/400 OS: 20/150] Ocular Hx-ptosis OU, pseudo OU, diabetic ret OU PMH-hx of stroke (1 month), cancer, DM    CURRENT MEDICATIONS: Current Outpatient Medications (Ophthalmic Drugs)  Medication Sig  . prednisoLONE acetate (PRED FORTE) 1 % ophthalmic suspension Place 1 drop into the left eye 4 (four) times daily for 7 days.   No current facility-administered medications for this visit.  (Ophthalmic Drugs)   Current Outpatient Medications (Other)  Medication Sig  . aspirin EC 81 MG tablet Take by mouth.  Marland Kitchen atorvastatin (LIPITOR) 80 MG tablet Take by mouth.  . Blood Glucose Monitoring Suppl (FIFTY50 GLUCOSE METER 2.0) w/Device KIT Use to check blood sugars. Freestyle glucose meter. Okay to substitute for other meter preferred by insurance. ICD-10 E11.9.  Marland Kitchen carvedilol (COREG) 25 MG tablet Take by mouth.  . donepezil (ARICEPT) 10 MG tablet Take by mouth.  . ferrous sulfate 325 (65 FE) MG tablet Take by mouth.  Marland Kitchen glucose  blood ([employer] TEST STRIPS) test strip Use to check blood sugars 3 times daily. Freestyle test strips. Ok to substitute for other strips as preferred by insurance. ICD-10 E11.9.  . insulin lispro (HUMALOG) 100 UNIT/ML injection Inject into the skin.  Marland Kitchen insulin NPH-regular Human (70-30) 100 UNIT/ML injection Inject into the skin.  Marland Kitchen lisinopril (PRINIVIL,ZESTRIL) 20 MG tablet Take 40 mg by mouth daily.  . meclizine (ANTIVERT) 25 MG tablet Take 1 tablet (25 mg total) by mouth 3 (three) times daily as needed for dizziness.   Current Facility-Administered Medications (Other)  Medication Route  . triamcinolone acetonide (KENALOG-40) injection 4 mg Intravitreal  . triamcinolone acetonide (KENALOG-40) injection 4 mg Intravitreal      REVIEW OF SYSTEMS: ROS    Positive for: Endocrine, Eyes   Negative for: Constitutional, Gastrointestinal, Neurological, Skin, Genitourinary, Musculoskeletal, HENT, Cardiovascular, Respiratory, Psychiatric, Allergic/Imm, Heme/Lymph   Last edited by Roselee Nova D on 02/07/2019 10:08 AM. (History)       ALLERGIES No Known Allergies  PAST MEDICAL HISTORY Past Medical History:  Diagnosis Date  . Cancer ([place])   . CVA (cerebral vascular accident) ([place])   . Diabetes mellitus without complication ([place])   . Stroke [facility])    Past Surgical History:  Procedure Laterality Date  . CATARACT EXTRACTION    . EYE SURGERY    . MASTECTOMY Bilateral     FAMILY HISTORY History  reviewed. No pertinent family history.  SOCIAL HISTORY Social History   Tobacco Use  . Smoking status: Former Research scientist (life sciences)  . Smokeless tobacco: Never Used  Substance Use Topics  . Alcohol use: Not Currently  . Drug use: Never         OPHTHALMIC EXAM:  Base Eye Exam    Visual Acuity (Snellen - Linear)      Right Left   Dist [place] CF_0 ' 20/100 +1   Dist ph [place] NI NI       Tonometry (Tonopen, 10:19 AM)      Right Left   Pressure 18 18       Pupils      Dark Light Shape React APD    Right 3 2 Round Slow None   Left 3 2 Round Slow None       Visual Fields      Left Right     Full   Restrictions Partial outer inferior temporal deficiency        Extraocular Movement      Right Left    Full, Ortho Full, Ortho       Neuro/Psych    Oriented x3:  Yes   Mood/Affect:  Normal       Dilation    Both eyes:  1.0% Mydriacyl, 2.5% Phenylephrine @ 10:19 AM        Slit Lamp and Fundus Exam    Slit Lamp Exam      Right Left   Lids/Lashes Dermatochalasis - upper lid Dermatochalasis - upper lid, Meibomian gland dysfunction   Conjunctiva/Sclera nasal and temporal Pinguecula, Melanosis nasal and temporal Pinguecula, Melanosis   Cornea mild Arcus, Well healed cataract wounds mild Arcus, Well healed cataract wounds   Anterior Chamber deep and clear deep and clear   Iris Round and dilated, No NVI Round and dilated, No NVI   Lens Posterior chamber intraocular lens, trace Posterior capsular opacification Posterior chamber intraocular lens, trace Posterior capsular opacification   Vitreous Vitreous syneresis, central VH Vitreous syneresis       Fundus Exam      Right Left   Disc mild Pallor, blot heme along superior nerve fine NVD - regressing, sharp rim   C/D Ratio 0.2 0.2   Macula no details visible Blunted foveal reflex, Epiretinal membrane, scattered IRH/exudates   Vessels Vascular attenuation Vascular attenuation, early NV   Periphery Grossly Attached Attached, scattered IRH, good 360 laser        Refraction    Manifest Refraction   Dark reflex OD          IMAGING AND PROCEDURES  Imaging and Procedures for _1 @  OCT, Retina - OU - Both Eyes       Right Eye Quality was poor. Progression has worsened. Findings include abnormal foveal contour, intraretinal fluid (Vitreous opacities, ERM, edema).   Left Eye Quality was good. Central Foveal Thickness: 565. Progression has worsened. Findings include abnormal foveal contour, subretinal fluid, intraretinal  fluid, epiretinal membrane (Mild interval increase in IRF/SRF).   Notes *Images captured and stored on drive  Diagnosis / Impression:  DME OU Mild ERM OU OD: interval development of VH, edema and ERM visible OS with pre-retinal fibrosis    Clinical management:  See below  Abbreviations: NFP - Normal foveal profile. CME - cystoid macular edema. PED - pigment epithelial detachment. IRF - intraretinal fluid. SRF - subretinal fluid. EZ - ellipsoid zone. ERM - epiretinal membrane. ORA - outer retinal atrophy. ORT - outer retinal  tubulation. SRHM - subretinal hyper-reflective material        Intravitreal Injection, Pharmacologic Agent - OD - Right Eye       Time Out 02/07/2019. 12:30 PM. Confirmed correct patient, procedure, site, and patient consented.   Anesthesia Anesthetic medications included Lidocaine 2%, Tetracaine 0.5%.   Procedure Preparation included eyelid speculum, 5% betadine to ocular surface. A 27 gauge needle was used.   Injection:  4 mg triamcinolone acetonide (KENALOG-40) injection   NDC: [address]-2, Lot: [id number], Expiration date: 01/13/2020   Route: Intravitreal, Site: Right Eye  Post-op Post injection exam found visual acuity of at least counting fingers. The patient tolerated the procedure well. There were no complications. The patient received written and verbal post procedure care education.   Notes An AC tap was performed following injection due to elevated IOP using a 30 gauge needle on a syringe with the plunger removed. The needle was placed at the limbus at 5 oclock and approximately 0.06 cc of aqueous was removed from the anterior chamber. Betadine was applied to the tap area before and after the paracentesis was performed. There were no complications. The patient tolerated the procedure well.        Intravitreal Injection, Pharmacologic Agent - OS - Left Eye       Time Out 02/07/2019. 12:35 PM. Confirmed correct patient, procedure, site,  and patient consented.   Anesthesia Topical anesthesia was used. Anesthetic medications included Lidocaine 2%, Proparacaine 0.5%.   Procedure Preparation included 5% betadine to ocular surface, eyelid speculum. A 27 gauge needle was used.   Injection:  4 mg triamcinolone acetonide (KENALOG-40) injection   NDC: 7893-8101-75, Lot: [id number], Expiration date: 12/13/2019   Route: Intravitreal, Site: Left Eye  Post-op Post injection exam found visual acuity of at least counting fingers. The patient tolerated the procedure well. There were no complications. The patient received written and verbal post procedure care education.   Notes An AC tap was performed following injection due to elevated IOP using a 30 gauge needle on a syringe with the plunger removed. The needle was placed at the limbus at 5 oclock and approximately 0.06 cc of aqueous was removed from the anterior chamber. Betadine was applied to the tap area before and after the paracentesis was performed. There were no complications. The patient tolerated the procedure well.                 ASSESSMENT/PLAN:    ICD-10-CM   1. Proliferative diabetic retinopathy of both eyes with macular edema associated with type 2 diabetes mellitus (HCC) D78.2423 Intravitreal Injection, Pharmacologic Agent - OD - Right Eye    Intravitreal Injection, Pharmacologic Agent - OS - Left Eye    triamcinolone acetonide (KENALOG-40) injection 4 mg    triamcinolone acetonide (KENALOG-40) injection 4 mg    CANCELED: Injection into Tenon's Capsule - OD - Right Eye    CANCELED: Injection into Tenon's Capsule - OS - Left Eye  2. Vitreous hemorrhage of right eye ([place]) H43.11   3. Retinal edema H35.81 OCT, Retina - OU - Both Eyes  4. Essential hypertension I10   5. Hypertensive retinopathy of both eyes H35.033   6. Pseudophakia of both eyes Z96.1   7. History of stroke Z86.73     1-3. Proliferative diabetic retinopathy w/ DME OU  - new onset VH OD  today 2.25.20 -- obscuring ability to perform PRP laser  - The incidence, risk factors for progression, natural history and treatment options for diabetic retinopathy were  discussed with patient.    - The need for close monitoring of blood glucose, blood pressure, and serum lipids, avoiding cigarette or any type of tobacco, and the need for long term follow up was also discussed with patient.  - difficult exam due to poor patient cooperation -- history of recent stroke -- but central edema OU and scattered IRH; +fine NVD OS; early flat NVE OD  - FA - unable to obtain 02.18.20  - s/p PRP OS (02.18.20)  - OCT shows interval development of VH OD, interval increase in IRF/SRF OS   - discussed findings, prognosis and treatment options for DME and proliferative diabetic retinopathy  - in particular, discussed that given recent stroke, anti-VEGF agents should be avoided  - also discussed side effects of intravitreal steroids -- elevated IOP/glaucoma  - recommend IVTA OU today, 02.25.20  - pt wishes to proceed   - RBA of procedure discussed, questions answered  - informed consent obtained and signed  - see procedure note  - f/u in 1-2 wks for f/u of VH OD -- will need PRP OD once vit heme clears  4,5. Hypertensive retinopathy OU - discussed importance of tight BP control and its relation to 1,2 above - monitor  6. Pseudophakia OU  - s/p CE/IOL OU  - doing well  - monitor  7. History of recent stroke - ischemic stroke on 12/26/2018 - pt reports some visual decline following stroke   Ophthalmic Meds Ordered this visit:  Meds ordered this encounter  Medications  . triamcinolone acetonide (KENALOG-40) injection 4 mg  . triamcinolone acetonide (KENALOG-40) injection 4 mg       Return for f/u 1-2 weeks, PDR OU/VH OD, DFE, OCT.  There are no Patient Instructions on file for this visit.   Explained the diagnoses, plan, and follow up with the patient and they expressed understanding.   Patient expressed understanding of the importance of proper follow up care.   This document serves as a record of services personally performed by Gardiner Sleeper, MD, PhD. It was created on their behalf by Ernest Mallick, OA, an ophthalmic assistant. The creation of this record is the provider's dictation and/or activities during the visit.    Electronically signed by: Ernest Mallick, OA 02.24.2020 5:05 PM    Gardiner Sleeper, M.D., Ph.D. Diseases & Surgery of the Retina and Vitreous [facility]   I have reviewed the above documentation for accuracy and completeness, and I agree with the above. Gardiner Sleeper, M.D., Ph.D. 02/07/19 5:05 PM   Abbreviations: M myopia (nearsighted); A astigmatism; H hyperopia (farsighted); P presbyopia; Mrx spectacle prescription;  CTL contact lenses; OD right eye; OS left eye; OU both eyes  XT exotropia; ET esotropia; PEK punctate epithelial keratitis; PEE punctate epithelial erosions; DES dry eye syndrome; MGD meibomian gland dysfunction; ATs artificial tears; PFAT's preservative free artificial tears; [place] nuclear sclerotic cataract; PSC posterior subcapsular cataract; ERM epi-retinal membrane; PVD posterior vitreous detachment; RD retinal detachment; DM diabetes mellitus; DR diabetic retinopathy; NPDR non-proliferative diabetic retinopathy; PDR proliferative diabetic retinopathy; CSME clinically significant macular edema; DME diabetic macular edema; dbh dot blot hemorrhages; CWS cotton wool spot; POAG primary open angle glaucoma; C/D cup-to-disc ratio; HVF humphrey visual field; GVF goldmann visual field; OCT optical coherence tomography; IOP intraocular pressure; BRVO Branch retinal vein occlusion; CRVO central retinal vein occlusion; CRAO central retinal artery occlusion; BRAO branch retinal artery occlusion; RT retinal tear; SB scleral buckle; PPV pars plana vitrectomy; VH Vitreous  hemorrhage; PRP panretinal laser photocoagulation; IVK  intravitreal kenalog; VMT vitreomacular traction; MH Macular hole;  NVD neovascularization of the disc; NVE neovascularization elsewhere; AREDS age related eye disease study; ARMD age related macular degeneration; POAG primary open angle glaucoma; EBMD epithelial/anterior basement membrane dystrophy; ACIOL anterior chamber intraocular lens; IOL intraocular lens; PCIOL posterior chamber intraocular lens; Phaco/IOL phacoemulsification with intraocular lens placement; [place] photorefractive keratectomy; LASIK laser assisted in situ keratomileusis; HTN hypertension; DM diabetes mellitus; COPD chronic obstructive pulmonary disease

## 2019-02-07 ENCOUNTER — Ambulatory Visit (INDEPENDENT_AMBULATORY_CARE_PROVIDER_SITE_OTHER): Payer: Medicare Other | Admitting: Ophthalmology

## 2019-02-07 ENCOUNTER — Encounter (INDEPENDENT_AMBULATORY_CARE_PROVIDER_SITE_OTHER): Payer: Self-pay | Admitting: Ophthalmology

## 2019-02-07 DIAGNOSIS — Z8673 Personal history of transient ischemic attack (TIA), and cerebral infarction without residual deficits: Secondary | ICD-10-CM

## 2019-02-07 DIAGNOSIS — E113513 Type 2 diabetes mellitus with proliferative diabetic retinopathy with macular edema, bilateral: Secondary | ICD-10-CM

## 2019-02-07 DIAGNOSIS — H4311 Vitreous hemorrhage, right eye: Secondary | ICD-10-CM | POA: Diagnosis not present

## 2019-02-07 DIAGNOSIS — Z961 Presence of intraocular lens: Secondary | ICD-10-CM

## 2019-02-07 DIAGNOSIS — H3581 Retinal edema: Secondary | ICD-10-CM | POA: Diagnosis not present

## 2019-02-07 DIAGNOSIS — H35033 Hypertensive retinopathy, bilateral: Secondary | ICD-10-CM

## 2019-02-07 DIAGNOSIS — I10 Essential (primary) hypertension: Secondary | ICD-10-CM | POA: Diagnosis not present

## 2019-02-07 MED ORDER — TRIAMCINOLONE ACETONIDE 40 MG/ML IJ SUSP FOR KALEIDOSCOPE
4.0000 mg | INTRAMUSCULAR | Status: DC
  Administered 2019-02-07: 4 mg via INTRAVITREAL

## 2019-02-07 NOTE — Progress Notes (Signed)
[facility] Note  02/07/2019     CHIEF COMPLAINT Patient presents for Retina Follow Up   HISTORY OF PRESENT ILLNESS: Desiree Pollard is a 73 y.o. female who presents to the clinic today for:   HPI    Retina Follow Up    Patient presents with  Diabetic Retinopathy.  In both eyes.  Severity is severe.  Duration of 1 week.  I, the attending physician,  performed the HPI with the patient and updated documentation appropriately.          Comments    Patient states vision the same OU.       Last edited by Bernarda Caffey, MD on 02/07/2019 11:11 AM. (History)    pt states she could not tell that her vision has gone down in her right eye or that she has new floaters, pt states her blood pressure has not been running high  Referring physician: No referring provider defined for this encounter.  HISTORICAL INFORMATION:   Selected notes from the MEDICAL RECORD NUMBER Referred by Dr. Angelena Form for DM exam LEE: 02.04.20 (A. Hager) [BCVA: OD: 20/400 OS: 20/150] Ocular Hx-ptosis OU, pseudo OU, diabetic ret OU PMH-hx of stroke (1 month), cancer, DM    CURRENT MEDICATIONS: Current Outpatient Medications (Ophthalmic Drugs)  Medication Sig  . prednisoLONE acetate (PRED FORTE) 1 % ophthalmic suspension Place 1 drop into the left eye 4 (four) times daily for 7 days.   No current facility-administered medications for this visit.  (Ophthalmic Drugs)   Current Outpatient Medications (Other)  Medication Sig  . aspirin EC 81 MG tablet Take by mouth.  Marland Kitchen atorvastatin (LIPITOR) 80 MG tablet Take by mouth.  . Blood Glucose Monitoring Suppl (FIFTY50 GLUCOSE METER 2.0) w/Device KIT Use to check blood sugars. Freestyle glucose meter. Okay to substitute for other meter preferred by insurance. ICD-10 E11.9.  Marland Kitchen carvedilol (COREG) 25 MG tablet Take by mouth.  . donepezil (ARICEPT) 10 MG tablet Take by mouth.  . ferrous sulfate 325 (65 FE) MG tablet Take by mouth.  Marland Kitchen glucose  blood ([employer] TEST STRIPS) test strip Use to check blood sugars 3 times daily. Freestyle test strips. Ok to substitute for other strips as preferred by insurance. ICD-10 E11.9.  . insulin lispro (HUMALOG) 100 UNIT/ML injection Inject into the skin.  Marland Kitchen insulin NPH-regular Human (70-30) 100 UNIT/ML injection Inject into the skin.  Marland Kitchen lisinopril (PRINIVIL,ZESTRIL) 20 MG tablet Take 40 mg by mouth daily.  . meclizine (ANTIVERT) 25 MG tablet Take 1 tablet (25 mg total) by mouth 3 (three) times daily as needed for dizziness.   Current Facility-Administered Medications (Other)  Medication Route  . triamcinolone acetonide (KENALOG-40) injection 4 mg Intravitreal  . triamcinolone acetonide (KENALOG-40) injection 4 mg Intravitreal      REVIEW OF SYSTEMS: ROS    Positive for: Endocrine, Eyes   Negative for: Constitutional, Gastrointestinal, Neurological, Skin, Genitourinary, Musculoskeletal, HENT, Cardiovascular, Respiratory, Psychiatric, Allergic/Imm, Heme/Lymph   Last edited by Roselee Nova D on 02/07/2019 10:08 AM. (History)       ALLERGIES No Known Allergies  PAST MEDICAL HISTORY Past Medical History:  Diagnosis Date  . Cancer ([place])   . CVA (cerebral vascular accident) ([place])   . Diabetes mellitus without complication ([place])   . Stroke [facility])    Past Surgical History:  Procedure Laterality Date  . CATARACT EXTRACTION    . EYE SURGERY    . MASTECTOMY Bilateral     FAMILY HISTORY History  reviewed. No pertinent family history.  SOCIAL HISTORY Social History   Tobacco Use  . Smoking status: Former Research scientist (life sciences)  . Smokeless tobacco: Never Used  Substance Use Topics  . Alcohol use: Not Currently  . Drug use: Never         OPHTHALMIC EXAM:  Base Eye Exam    Visual Acuity (Snellen - Linear)      Right Left   Dist [place] CF_0 ' 20/100 +1   Dist ph [place] NI NI       Tonometry (Tonopen, 10:19 AM)      Right Left   Pressure 18 18       Pupils      Dark Light Shape React APD    Right 3 2 Round Slow None   Left 3 2 Round Slow None       Visual Fields      Left Right     Full   Restrictions Partial outer inferior temporal deficiency        Extraocular Movement      Right Left    Full, Ortho Full, Ortho       Neuro/Psych    Oriented x3:  Yes   Mood/Affect:  Normal       Dilation    Both eyes:  1.0% Mydriacyl, 2.5% Phenylephrine @ 10:19 AM        Slit Lamp and Fundus Exam    Slit Lamp Exam      Right Left   Lids/Lashes Dermatochalasis - upper lid Dermatochalasis - upper lid, Meibomian gland dysfunction   Conjunctiva/Sclera nasal and temporal Pinguecula, Melanosis nasal and temporal Pinguecula, Melanosis   Cornea mild Arcus, Well healed cataract wounds mild Arcus, Well healed cataract wounds   Anterior Chamber deep and clear deep and clear   Iris Round and dilated, No NVI Round and dilated, No NVI   Lens Posterior chamber intraocular lens, trace Posterior capsular opacification Posterior chamber intraocular lens, trace Posterior capsular opacification   Vitreous Vitreous syneresis, central VH Vitreous syneresis       Fundus Exam      Right Left   Disc mild Pallor, blot heme along superior nerve fine NVD - regressing, sharp rim   C/D Ratio 0.2 0.2   Macula no details visible Blunted foveal reflex, Epiretinal membrane, scattered IRH/exudates   Vessels Vascular attenuation Vascular attenuation, early NV   Periphery Grossly Attached Attached, scattered IRH, good 360 laser        Refraction    Manifest Refraction   Dark reflex OD          IMAGING AND PROCEDURES  Imaging and Procedures for _1 @  OCT, Retina - OU - Both Eyes       Right Eye Quality was poor. Progression has worsened. Findings include abnormal foveal contour, intraretinal fluid (Vitreous opacities, ERM, edema).   Left Eye Quality was good. Central Foveal Thickness: 565. Progression has worsened. Findings include abnormal foveal contour, subretinal fluid, intraretinal  fluid, epiretinal membrane (Mild interval increase in IRF/SRF).   Notes *Images captured and stored on drive  Diagnosis / Impression:  DME OU Mild ERM OU OD: interval development of VH, edema and ERM visible OS with pre-retinal fibrosis    Clinical management:  See below  Abbreviations: NFP - Normal foveal profile. CME - cystoid macular edema. PED - pigment epithelial detachment. IRF - intraretinal fluid. SRF - subretinal fluid. EZ - ellipsoid zone. ERM - epiretinal membrane. ORA - outer retinal atrophy. ORT - outer retinal  tubulation. SRHM - subretinal hyper-reflective material        Intravitreal Injection, Pharmacologic Agent - OD - Right Eye       Time Out 02/07/2019. 12:30 PM. Confirmed correct patient, procedure, site, and patient consented.   Anesthesia Anesthetic medications included Lidocaine 2%, Tetracaine 0.5%.   Procedure Preparation included eyelid speculum, 5% betadine to ocular surface. A 27 gauge needle was used.   Injection:  4 mg triamcinolone acetonide (KENALOG-40) injection   NDC: [address]-2, Lot: [id number], Expiration date: 01/13/2020   Route: Intravitreal, Site: Right Eye  Post-op Post injection exam found visual acuity of at least counting fingers. The patient tolerated the procedure well. There were no complications. The patient received written and verbal post procedure care education.   Notes An AC tap was performed following injection due to elevated IOP using a 30 gauge needle on a syringe with the plunger removed. The needle was placed at the limbus at 5 oclock and approximately 0.06 cc of aqueous was removed from the anterior chamber. Betadine was applied to the tap area before and after the paracentesis was performed. There were no complications. The patient tolerated the procedure well.        Intravitreal Injection, Pharmacologic Agent - OS - Left Eye       Time Out 02/07/2019. 12:35 PM. Confirmed correct patient, procedure, site,  and patient consented.   Anesthesia Topical anesthesia was used. Anesthetic medications included Lidocaine 2%, Proparacaine 0.5%.   Procedure Preparation included 5% betadine to ocular surface, eyelid speculum. A 27 gauge needle was used.   Injection:  4 mg triamcinolone acetonide (KENALOG-40) injection   NDC: 7893-8101-75, Lot: [id number], Expiration date: 12/13/2019   Route: Intravitreal, Site: Left Eye  Post-op Post injection exam found visual acuity of at least counting fingers. The patient tolerated the procedure well. There were no complications. The patient received written and verbal post procedure care education.   Notes An AC tap was performed following injection due to elevated IOP using a 30 gauge needle on a syringe with the plunger removed. The needle was placed at the limbus at 5 oclock and approximately 0.06 cc of aqueous was removed from the anterior chamber. Betadine was applied to the tap area before and after the paracentesis was performed. There were no complications. The patient tolerated the procedure well.                 ASSESSMENT/PLAN:    ICD-10-CM   1. Proliferative diabetic retinopathy of both eyes with macular edema associated with type 2 diabetes mellitus (HCC) D78.2423 Intravitreal Injection, Pharmacologic Agent - OD - Right Eye    Intravitreal Injection, Pharmacologic Agent - OS - Left Eye    triamcinolone acetonide (KENALOG-40) injection 4 mg    triamcinolone acetonide (KENALOG-40) injection 4 mg    CANCELED: Injection into Tenon's Capsule - OD - Right Eye    CANCELED: Injection into Tenon's Capsule - OS - Left Eye  2. Vitreous hemorrhage of right eye ([place]) H43.11   3. Retinal edema H35.81 OCT, Retina - OU - Both Eyes  4. Essential hypertension I10   5. Hypertensive retinopathy of both eyes H35.033   6. Pseudophakia of both eyes Z96.1   7. History of stroke Z86.73     1-3. Proliferative diabetic retinopathy w/ DME OU  - new onset VH OD  today 2.25.20 -- obscuring ability to perform PRP laser  - The incidence, risk factors for progression, natural history and treatment options for diabetic retinopathy were  discussed with patient.    - The need for close monitoring of blood glucose, blood pressure, and serum lipids, avoiding cigarette or any type of tobacco, and the need for long term follow up was also discussed with patient.  - difficult exam due to poor patient cooperation -- history of recent stroke -- but central edema OU and scattered IRH; +fine NVD OS; early flat NVE OD  - FA - unable to obtain 02.18.20  - s/p PRP OS (02.18.20)  - OCT shows interval development of VH OD, interval increase in IRF/SRF OS   - discussed findings, prognosis and treatment options for DME and proliferative diabetic retinopathy  - in particular, discussed that given recent stroke, anti-VEGF agents should be avoided  - also discussed side effects of intravitreal steroids -- elevated IOP/glaucoma  - recommend IVTA OU today, 02.25.20  - pt wishes to proceed   - RBA of procedure discussed, questions answered  - informed consent obtained and signed  - see procedure note  - f/u in 1-2 wks for f/u of VH OD -- will need PRP OD once vit heme clears  4,5. Hypertensive retinopathy OU - discussed importance of tight BP control and its relation to 1,2 above - monitor  6. Pseudophakia OU  - s/p CE/IOL OU  - doing well  - monitor  7. History of recent stroke - ischemic stroke on 12/26/2018 - pt reports some visual decline following stroke   Ophthalmic Meds Ordered this visit:  Meds ordered this encounter  Medications  . triamcinolone acetonide (KENALOG-40) injection 4 mg  . triamcinolone acetonide (KENALOG-40) injection 4 mg       Return for f/u 1-2 weeks, PDR OU/VH OD, DFE, OCT.  There are no Patient Instructions on file for this visit.   Explained the diagnoses, plan, and follow up with the patient and they expressed understanding.   Patient expressed understanding of the importance of proper follow up care.   This document serves as a record of services personally performed by Gardiner Sleeper, MD, PhD. It was created on their behalf by Ernest Mallick, OA, an ophthalmic assistant. The creation of this record is the provider's dictation and/or activities during the visit.    Electronically signed by: Ernest Mallick, OA 02.24.2020 5:05 PM    Gardiner Sleeper, M.D., Ph.D. Diseases & Surgery of the Retina and Vitreous [facility]   I have reviewed the above documentation for accuracy and completeness, and I agree with the above. Gardiner Sleeper, M.D., Ph.D. 02/07/19 5:05 PM   Abbreviations: M myopia (nearsighted); A astigmatism; H hyperopia (farsighted); P presbyopia; Mrx spectacle prescription;  CTL contact lenses; OD right eye; OS left eye; OU both eyes  XT exotropia; ET esotropia; PEK punctate epithelial keratitis; PEE punctate epithelial erosions; DES dry eye syndrome; MGD meibomian gland dysfunction; ATs artificial tears; PFAT's preservative free artificial tears; [place] nuclear sclerotic cataract; PSC posterior subcapsular cataract; ERM epi-retinal membrane; PVD posterior vitreous detachment; RD retinal detachment; DM diabetes mellitus; DR diabetic retinopathy; NPDR non-proliferative diabetic retinopathy; PDR proliferative diabetic retinopathy; CSME clinically significant macular edema; DME diabetic macular edema; dbh dot blot hemorrhages; CWS cotton wool spot; POAG primary open angle glaucoma; C/D cup-to-disc ratio; HVF humphrey visual field; GVF goldmann visual field; OCT optical coherence tomography; IOP intraocular pressure; BRVO Branch retinal vein occlusion; CRVO central retinal vein occlusion; CRAO central retinal artery occlusion; BRAO branch retinal artery occlusion; RT retinal tear; SB scleral buckle; PPV pars plana vitrectomy; VH Vitreous  hemorrhage; PRP panretinal laser photocoagulation; IVK  intravitreal kenalog; VMT vitreomacular traction; MH Macular hole;  NVD neovascularization of the disc; NVE neovascularization elsewhere; AREDS age related eye disease study; ARMD age related macular degeneration; POAG primary open angle glaucoma; EBMD epithelial/anterior basement membrane dystrophy; ACIOL anterior chamber intraocular lens; IOL intraocular lens; PCIOL posterior chamber intraocular lens; Phaco/IOL phacoemulsification with intraocular lens placement; [place] photorefractive keratectomy; LASIK laser assisted in situ keratomileusis; HTN hypertension; DM diabetes mellitus; COPD chronic obstructive pulmonary disease

## 2019-02-08 ENCOUNTER — Encounter (INDEPENDENT_AMBULATORY_CARE_PROVIDER_SITE_OTHER): Payer: Medicare Other | Admitting: Ophthalmology

## 2019-02-20 NOTE — Progress Notes (Addendum)
[facility] Note  02/21/2019     CHIEF COMPLAINT Patient presents for Retina Follow Up   HISTORY OF PRESENT ILLNESS: Desiree Pollard is a 73 y.o. female who presents to the clinic today for:   HPI    Retina Follow Up    Patient presents with  Diabetic Retinopathy.  In both eyes.  This started 2 weeks ago.  Severity is moderate.  Duration of 2 weeks.  Since onset it is gradually improving.  I, the attending physician,  performed the HPI with the patient and updated documentation appropriately.          Comments    Patient here for 2 weeks retina follow up for  PDR with DME OU/ VH OD. Patient states vision can see a little bit. Blurry sometimes. No eye pain,        Last edited by Bernarda Caffey, MD on 02/22/2019 11:20 PM. (History)    pt states she feels like her vision is getting a little better  Referring physician: No referring provider defined for this encounter.  HISTORICAL INFORMATION:   Selected notes from the MEDICAL RECORD NUMBER Referred by Dr. Angelena Form for DM exam LEE: 02.04.20 (A. Hager) [BCVA: OD: 20/400 OS: 20/150] Ocular Hx-ptosis OU, pseudo OU, diabetic ret OU PMH-hx of stroke (1 month), cancer, DM    CURRENT MEDICATIONS: No current outpatient medications on file. (Ophthalmic Drugs)   No current facility-administered medications for this visit.  (Ophthalmic Drugs)   Current Outpatient Medications (Other)  Medication Sig  . aspirin EC 81 MG tablet Take by mouth.  Marland Kitchen atorvastatin (LIPITOR) 80 MG tablet Take by mouth.  . Blood Glucose Monitoring Suppl (FIFTY50 GLUCOSE METER 2.0) w/Device KIT Use to check blood sugars. Freestyle glucose meter. Okay to substitute for other meter preferred by insurance. ICD-10 E11.9.  Marland Kitchen carvedilol (COREG) 25 MG tablet Take by mouth.  . donepezil (ARICEPT) 10 MG tablet Take by mouth.  . ferrous sulfate 325 (65 FE) MG tablet Take by mouth.  Marland Kitchen glucose blood ([employer] TEST STRIPS) test strip Use to check  blood sugars 3 times daily. Freestyle test strips. Ok to substitute for other strips as preferred by insurance. ICD-10 E11.9.  . insulin lispro (HUMALOG) 100 UNIT/ML injection Inject into the skin.  Marland Kitchen insulin NPH-regular Human (70-30) 100 UNIT/ML injection Inject into the skin.  Marland Kitchen lisinopril (PRINIVIL,ZESTRIL) 20 MG tablet Take 40 mg by mouth daily.  . meclizine (ANTIVERT) 25 MG tablet Take 1 tablet (25 mg total) by mouth 3 (three) times daily as needed for dizziness.   Current Facility-Administered Medications (Other)  Medication Route  . triamcinolone acetonide (KENALOG-40) injection 4 mg Intravitreal  . triamcinolone acetonide (KENALOG-40) injection 4 mg Intravitreal      REVIEW OF SYSTEMS: ROS    Positive for: Endocrine, Eyes   Negative for: Constitutional, Gastrointestinal, Neurological, Skin, Genitourinary, Musculoskeletal, HENT, Cardiovascular, Respiratory, Psychiatric, Allergic/Imm, Heme/Lymph   Last edited by Theodore Demark on 02/21/2019  2:40 PM. (History)       ALLERGIES No Known Allergies  PAST MEDICAL HISTORY Past Medical History:  Diagnosis Date  . Cancer ([place])   . CVA (cerebral vascular accident) ([place])   . Diabetes mellitus without complication ([place])   . Stroke [facility])    Past Surgical History:  Procedure Laterality Date  . CATARACT EXTRACTION    . EYE SURGERY    . MASTECTOMY Bilateral     FAMILY HISTORY History reviewed. No pertinent family history.  SOCIAL HISTORY Social History   Tobacco Use  . Smoking status: Former Research scientist (life sciences)  . Smokeless tobacco: Never Used  Substance Use Topics  . Alcohol use: Not Currently  . Drug use: Never         OPHTHALMIC EXAM:  Base Eye Exam    Visual Acuity (Snellen - Linear)      Right Left   Dist [place] 20/70 20/50 +2   Dist ph [place] NI NI       Tonometry (Tonopen, 2:35 PM)      Right Left   Pressure 17 19       Pupils      Dark Light Shape React APD   Right 3 2 Round Brisk None   Left 3 2 Round Brisk  None       Visual Fields (Counting fingers)      Left Right     Full   Restrictions Partial outer inferior temporal deficiency        Extraocular Movement      Right Left    Full Full       Neuro/Psych    Oriented x3:  Yes   Mood/Affect:  Normal       Dilation    Both eyes:  1.0% Mydriacyl, 2.5% Phenylephrine @ 2:35 PM        Slit Lamp and Fundus Exam    Slit Lamp Exam      Right Left   Lids/Lashes Dermatochalasis - upper lid Dermatochalasis - upper lid, Meibomian gland dysfunction   Conjunctiva/Sclera nasal and temporal Pinguecula, Melanosis nasal and temporal Pinguecula, Melanosis   Cornea mild Arcus, Well healed cataract wounds mild Arcus, Well healed cataract wounds   Anterior Chamber deep and clear deep and clear   Iris Round and dilated, No NVI Round and dilated, No NVI   Lens Posterior chamber intraocular lens, trace Posterior capsular opacification Posterior chamber intraocular lens, trace Posterior capsular opacification   Vitreous Vitreous syneresis, vast improvement in VH -- clearing centrally and settling inferiorly Vitreous syneresis       Fundus Exam      Right Left   Disc mild Pallor, sharp rim, ?NVD fine NVD - regressing, sharp rim   C/D Ratio 0.2 0.2   Macula Blunted foveal reflex, IRH, exudates Blunted foveal reflex, Epiretinal membrane, scattered IRH/exudates - improved, CWS IN to macula   Vessels Vascular attenuation severe Vascular attenuation   Periphery Attached, scattered IRH/MA Attached, scattered IRH, good light 360 PRP, room for fill-in          IMAGING AND PROCEDURES  Imaging and Procedures for _0 @  OCT, Retina - OU - Both Eyes       Right Eye Quality was borderline. Central Foveal Thickness: 480. Progression has improved. Findings include abnormal foveal contour, intraretinal fluid, subretinal fluid (Interval improvement in Vitreous opacities, ERM, edema).   Left Eye Quality was good. Central Foveal Thickness: 419.  Progression has improved. Findings include abnormal foveal contour, subretinal fluid, intraretinal fluid, epiretinal membrane (Mild interval improvement in IRF/SRF).   Notes *Images captured and stored on drive  Diagnosis / Impression:  DME OU Mild ERM OU OD: interval development of VH, edema and ERM visible OS: mild interval improvement in IRF/SRF; +pre-retinal fibrosis    Clinical management:  See below  Abbreviations: NFP - Normal foveal profile. CME - cystoid macular edema. PED - pigment epithelial detachment. IRF - intraretinal fluid. SRF - subretinal fluid. EZ - ellipsoid zone. ERM - epiretinal membrane. ORA -  outer retinal atrophy. ORT - outer retinal tubulation. SRHM - subretinal hyper-reflective material                 ASSESSMENT/PLAN:    ICD-10-CM   1. Proliferative diabetic retinopathy of both eyes with macular edema associated with type 2 diabetes mellitus ([place]) W97.9892   2. Vitreous hemorrhage of right eye ([place]) H43.11   3. Retinal edema H35.81 OCT, Retina - OU - Both Eyes  4. Essential hypertension I10   5. Hypertensive retinopathy of both eyes H35.033   6. Pseudophakia of both eyes Z96.1   7. History of stroke Z86.73     1-3. Proliferative diabetic retinopathy w/ DME OU  - VH OD onset 2.25.20 -- clearing but still obscuring ability to perform PRP laser  - The incidence, risk factors for progression, natural history and treatment options for diabetic retinopathy were discussed with patient.    - The need for close monitoring of blood glucose, blood pressure, and serum lipids, avoiding cigarette or any type of tobacco, and the need for long term follow up was also discussed with patient.  - difficult exam due to poor patient cooperation -- history of recent stroke -- but central edema OU and scattered IRH; +fine NVD OS; early flat NVE OD  - FA - unable to obtain 02.18.20  - s/p PRP OS (02.18.20)  - s/p IVTA OU #1 (02.25.20)  - OCT shows interval  improvement of VH OD, interval decrease in IRF/SRF OU  - discussed findings, prognosis and treatment options for DME and proliferative diabetic retinopathy  - in particular, discussed that given recent stroke, anti-VEGF agents should be avoided  - f/u in 1-2 wks for f/u of VH OD -- will need PRP OD once vit heme clears  4,5. Hypertensive retinopathy OU - discussed importance of tight BP control and its relation to 1,2 above - monitor  6. Pseudophakia OU  - s/p CE/IOL OU  - doing well  - monitor  7. History of recent stroke - ischemic stroke on 12/26/2018 - pt reports some visual decline following stroke   Ophthalmic Meds Ordered this visit:  No orders of the defined types were placed in this encounter.      Return in about 2 weeks (around 03/07/2019) for f/u PDR, DFE, OCT.  There are no Patient Instructions on file for this visit.   Explained the diagnoses, plan, and follow up with the patient and they expressed understanding.  Patient expressed understanding of the importance of proper follow up care.   This document serves as a record of services personally performed by Gardiner Sleeper, MD, PhD. It was created on their behalf by Ernest Mallick, OA, an ophthalmic assistant. The creation of this record is the provider's dictation and/or activities during the visit.    Electronically signed by: Ernest Mallick, OA  03.09.2020 11:24 PM     Gardiner Sleeper, M.D., Ph.D. Diseases & Surgery of the Retina and Vitreous [facility]: M myopia (nearsighted); A astigmatism; H hyperopia (farsighted); P presbyopia; Mrx spectacle prescription;  CTL contact lenses; OD right eye; OS left eye; OU both eyes  XT exotropia; ET esotropia; PEK punctate epithelial keratitis; PEE punctate epithelial erosions; DES dry eye syndrome; MGD meibomian gland dysfunction; ATs artificial tears; PFAT's preservative free artificial tears; [place] nuclear sclerotic cataract; PSC  posterior subcapsular cataract; ERM epi-retinal membrane; PVD posterior vitreous detachment; RD retinal detachment; DM diabetes mellitus; DR diabetic retinopathy; NPDR  non-proliferative diabetic retinopathy; PDR proliferative diabetic retinopathy; CSME clinically significant macular edema; DME diabetic macular edema; dbh dot blot hemorrhages; CWS cotton wool spot; POAG primary open angle glaucoma; C/D cup-to-disc ratio; HVF humphrey visual field; GVF goldmann visual field; OCT optical coherence tomography; IOP intraocular pressure; BRVO Branch retinal vein occlusion; CRVO central retinal vein occlusion; CRAO central retinal artery occlusion; BRAO branch retinal artery occlusion; RT retinal tear; SB scleral buckle; PPV pars plana vitrectomy; VH Vitreous hemorrhage; PRP panretinal laser photocoagulation; IVK intravitreal kenalog; VMT vitreomacular traction; MH Macular hole;  NVD neovascularization of the disc; NVE neovascularization elsewhere; AREDS age related eye disease study; ARMD age related macular degeneration; POAG primary open angle glaucoma; EBMD epithelial/anterior basement membrane dystrophy; ACIOL anterior chamber intraocular lens; IOL intraocular lens; PCIOL posterior chamber intraocular lens; Phaco/IOL phacoemulsification with intraocular lens placement; [place] photorefractive keratectomy; LASIK laser assisted in situ keratomileusis; HTN hypertension; DM diabetes mellitus; COPD chronic obstructive pulmonary disease

## 2019-02-21 ENCOUNTER — Ambulatory Visit (INDEPENDENT_AMBULATORY_CARE_PROVIDER_SITE_OTHER): Payer: Medicare Other | Admitting: Ophthalmology

## 2019-02-21 ENCOUNTER — Encounter (INDEPENDENT_AMBULATORY_CARE_PROVIDER_SITE_OTHER): Payer: Self-pay | Admitting: Ophthalmology

## 2019-02-21 DIAGNOSIS — H3581 Retinal edema: Secondary | ICD-10-CM

## 2019-02-21 DIAGNOSIS — H4311 Vitreous hemorrhage, right eye: Secondary | ICD-10-CM

## 2019-02-21 DIAGNOSIS — H35033 Hypertensive retinopathy, bilateral: Secondary | ICD-10-CM

## 2019-02-21 DIAGNOSIS — Z8673 Personal history of transient ischemic attack (TIA), and cerebral infarction without residual deficits: Secondary | ICD-10-CM

## 2019-02-21 DIAGNOSIS — Z961 Presence of intraocular lens: Secondary | ICD-10-CM

## 2019-02-21 DIAGNOSIS — I10 Essential (primary) hypertension: Secondary | ICD-10-CM

## 2019-02-21 DIAGNOSIS — E113513 Type 2 diabetes mellitus with proliferative diabetic retinopathy with macular edema, bilateral: Secondary | ICD-10-CM | POA: Diagnosis not present

## 2019-03-05 NOTE — Progress Notes (Signed)
[facility] Note  03/06/2019     CHIEF COMPLAINT Patient presents for Retina Follow Up   HISTORY OF PRESENT ILLNESS: Desiree Pollard is a 73 y.o. female who presents to the clinic today for:   HPI    Retina Follow Up    Patient presents with  Diabetic Retinopathy.  In both eyes.  Severity is moderate.  Duration of 2 weeks.  Since onset it is gradually improving.  I, the attending physician,  performed the HPI with the patient and updated documentation appropriately.          Comments    Patient states vision improved some OU. BS was 102 this am. Last a1c unknown.        Last edited by Bernarda Caffey, MD on 03/06/2019  1:18 PM. (History)    pt states she feels like her vision is getting a little better  Referring physician: No referring provider defined for this encounter.  HISTORICAL INFORMATION:   Selected notes from the MEDICAL RECORD NUMBER Referred by Dr. Angelena Form for DM exam LEE: 02.04.20 (A. Hager) [BCVA: OD: 20/400 OS: 20/150] Ocular Hx-ptosis OU, pseudo OU, diabetic ret OU PMH-hx of stroke (1 month), cancer, DM    CURRENT MEDICATIONS: No current outpatient medications on file. (Ophthalmic Drugs)   No current facility-administered medications for this visit.  (Ophthalmic Drugs)   Current Outpatient Medications (Other)  Medication Sig  . aspirin EC 81 MG tablet Take by mouth.  Marland Kitchen atorvastatin (LIPITOR) 80 MG tablet Take by mouth.  . Blood Glucose Monitoring Suppl (FIFTY50 GLUCOSE METER 2.0) w/Device KIT Use to check blood sugars. Freestyle glucose meter. Okay to substitute for other meter preferred by insurance. ICD-10 E11.9.  Marland Kitchen carvedilol (COREG) 25 MG tablet Take by mouth.  . donepezil (ARICEPT) 10 MG tablet Take by mouth.  . ferrous sulfate 325 (65 FE) MG tablet Take by mouth.  Marland Kitchen glucose blood ([employer] TEST STRIPS) test strip Use to check blood sugars 3 times daily. Freestyle test strips. Ok to substitute for other strips as  preferred by insurance. ICD-10 E11.9.  . insulin lispro (HUMALOG) 100 UNIT/ML injection Inject into the skin.  Marland Kitchen insulin NPH-regular Human (70-30) 100 UNIT/ML injection Inject into the skin.  Marland Kitchen lisinopril (PRINIVIL,ZESTRIL) 20 MG tablet Take 40 mg by mouth daily.  . meclizine (ANTIVERT) 25 MG tablet Take 1 tablet (25 mg total) by mouth 3 (three) times daily as needed for dizziness.   Current Facility-Administered Medications (Other)  Medication Route  . triamcinolone acetonide (KENALOG-40) injection 4 mg Intravitreal  . triamcinolone acetonide (KENALOG-40) injection 4 mg Intravitreal      REVIEW OF SYSTEMS: ROS    Positive for: Endocrine, Eyes   Negative for: Constitutional, Gastrointestinal, Neurological, Skin, Genitourinary, Musculoskeletal, HENT, Cardiovascular, Respiratory, Psychiatric, Allergic/Imm, Heme/Lymph   Last edited by Roselee Nova D on 03/06/2019 12:46 PM. (History)       ALLERGIES No Known Allergies  PAST MEDICAL HISTORY Past Medical History:  Diagnosis Date  . Cancer ([place])   . CVA (cerebral vascular accident) ([place])   . Diabetes mellitus without complication ([place])   . Stroke [facility])    Past Surgical History:  Procedure Laterality Date  . CATARACT EXTRACTION    . EYE SURGERY    . MASTECTOMY Bilateral     FAMILY HISTORY History reviewed. No pertinent family history.  SOCIAL HISTORY Social History   Tobacco Use  . Smoking status: Former Research scientist (life sciences)  . Smokeless tobacco: Never Used  Substance Use Topics  . Alcohol use: Not Currently  . Drug use: Never         OPHTHALMIC EXAM:  Base Eye Exam    Visual Acuity (Snellen - Linear)      Right Left   Dist [place] 20/70 +2 20/80 +2   Dist ph [place] NI NI       Tonometry (Tonopen, 1:02 PM)      Right Left   Pressure 14 19       Pupils      Dark Light Shape React APD   Right 3 2 Round Brisk None   Left 3 2 Round Brisk None       Visual Fields (Counting fingers)      Left Right     Full   Restrictions  Partial outer inferior temporal deficiency        Extraocular Movement      Right Left    Full, Ortho Full, Ortho       Neuro/Psych    Oriented x3:  Yes   Mood/Affect:  Normal       Dilation    Both eyes:  1.0% Mydriacyl, 2.5% Phenylephrine @ 1:02 PM        Slit Lamp and Fundus Exam    Slit Lamp Exam      Right Left   Lids/Lashes Dermatochalasis - upper lid Dermatochalasis - upper lid, Meibomian gland dysfunction   Conjunctiva/Sclera nasal and temporal Pinguecula, Melanosis nasal and temporal Pinguecula, Melanosis   Cornea mild Arcus, Well healed cataract wounds mild Arcus, Well healed cataract wounds   Anterior Chamber deep and clear deep and clear   Iris Round and dilated, No NVI Round and dilated, No NVI   Lens Posterior chamber intraocular lens, trace Posterior capsular opacification Posterior chamber intraocular lens, trace Posterior capsular opacification   Vitreous Vitreous syneresis, vast improvement in VH -- clearing centrally and settling inferiorly Vitreous syneresis       Fundus Exam      Right Left   Disc mild Pallor, sharp rim, ?NVD, fibrosis superiorly and surrounding IRH fine NVD - regressing, sharp rim   C/D Ratio 0.2 0.2   Macula Blunted foveal reflex, scattered IRH, exudates Blunted foveal reflex, Epiretinal membrane, scattered IRH/exudates - improved, CWS IN to macula, pseudo hole/macular cyst   Vessels Vascular attenuation severe Vascular attenuation   Periphery Attached, scattered IRH/MA Attached, scattered IRH, good 360 PRP, room for fill-in          IMAGING AND PROCEDURES  Imaging and Procedures for _0 @  OCT, Retina - OU - Both Eyes       Right Eye Quality was borderline. Central Foveal Thickness: 498. Progression has been stable. Findings include abnormal foveal contour, intraretinal fluid, subretinal fluid (Persistent IRF).   Left Eye Quality was good. Central Foveal Thickness: 737. Progression has been stable. Findings include  abnormal foveal contour, subretinal fluid, intraretinal fluid, epiretinal membrane (persistent IRF with traction).   Notes *Images captured and stored on drive  Diagnosis / Impression:  DME OU Mild ERM OU OD: interval improvement in VH, persistent IRF OS: persistent IRF with traction   Clinical management:  See below  Abbreviations: NFP - Normal foveal profile. CME - cystoid macular edema. PED - pigment epithelial detachment. IRF - intraretinal fluid. SRF - subretinal fluid. EZ - ellipsoid zone. ERM - epiretinal membrane. ORA - outer retinal atrophy. ORT - outer retinal tubulation. SRHM - subretinal hyper-reflective material  Panretinal Photocoagulation - OD - Right Eye       LASER PROCEDURE NOTE  Diagnosis:   Proliferative Diabetic Retinopathy, RIGHT EYE  Procedure:  Pan-retinal photocoagulation using slit lamp laser, RIGHT EYE  Anesthesia:  Topical  Surgeon: Bernarda Caffey, MD, PhD   Informed consent obtained, operative eye marked, and time out performed prior to initiation of laser.   Lumenis [id number] slit lamp laser Pattern: 3x3 square Power: 280 mW Duration: 30 msec  Spot size: 200 microns  # spots: [id number] spots   Complications: None.  Notes: significant vitreous heme obscuring view and preventing laser up take inferiorly and scattered focal areas  RTC: 4 wks  Patient tolerated the procedure well and received written and verbal post-procedure care information/education.         Intravitreal Injection, Pharmacologic Agent - OD - Right Eye       Time Out 03/06/2019. 3:54 PM. Confirmed correct patient, procedure, site, and patient consented.   Anesthesia Topical anesthesia was used. Anesthetic medications included Lidocaine 2%, Proparacaine 0.5%.   Procedure Preparation included 5% betadine to ocular surface, eyelid speculum. A 27 gauge needle was used.   Injection:  4 mg triamcinolone acetonide (KENALOG-40) injection   NDC: 8338-2505-39,  Lot: [id number], Expiration date: 02/10/2020   Route: Intravitreal, Site: Right Eye  Post-op Post injection exam found visual acuity of at least counting fingers. The patient tolerated the procedure well. There were no complications. The patient received written and verbal post procedure care education.        Intravitreal Injection, Pharmacologic Agent - OS - Left Eye       Time Out 03/06/2019. 3:00 PM. Confirmed correct patient, procedure, site, and patient consented.   Anesthesia Topical anesthesia was used. Anesthetic medications included Lidocaine 2%, Proparacaine 0.5%.   Procedure Preparation included 5% betadine to ocular surface, eyelid speculum. A 30 gauge needle was used.   Injection:  4 mg triamcinolone acetonide (KENALOG-40) injection   NDC: 3790-2409-73, Lot: [id number], Expiration date: 02/10/2020   Route: Intravitreal, Site: Left Eye  Post-op Post injection exam found visual acuity of at least counting fingers. The patient tolerated the procedure well. There were no complications. The patient received written and verbal post procedure care education.                 ASSESSMENT/PLAN:    ICD-10-CM   1. Proliferative diabetic retinopathy of both eyes with macular edema associated with type 2 diabetes mellitus (HCC) Q68.3419 Panretinal Photocoagulation - OD - Right Eye    Intravitreal Injection, Pharmacologic Agent - OD - Right Eye    Intravitreal Injection, Pharmacologic Agent - OS - Left Eye    triamcinolone acetonide (KENALOG-40) injection 4 mg    triamcinolone acetonide (KENALOG-40) injection 4 mg  2. Vitreous hemorrhage of right eye (HCC) H43.11 Panretinal Photocoagulation - OD - Right Eye  3. Retinal edema H35.81 OCT, Retina - OU - Both Eyes  4. Essential hypertension I10   5. Hypertensive retinopathy of both eyes H35.033   6. Pseudophakia of both eyes Z96.1   7. History of stroke Z86.73     1-3. Proliferative diabetic retinopathy w/ DME OU  - VH OD onset  2.25.20 -- clearing and settling inferiorly  - The incidence, risk factors for progression, natural history and treatment options for diabetic retinopathy were discussed with patient.    - The need for close monitoring of blood glucose, blood pressure, and serum lipids, avoiding cigarette or any type of tobacco, and the need for  long term follow up was also discussed with patient.  - difficult exam due to poor patient cooperation -- history of recent stroke -- but central edema OU and scattered IRH; +fine NVD OS; early flat NVE OD  - FA - unable to obtain 02.18.20  - discussed findings, prognosis and treatment options for DME and proliferative diabetic retinopathy  - in particular, discussed that given recent stroke, anti-VEGF agents should be avoided  - s/p PRP OS (02.18.20)  - s/p IVTA OU #1 (02.25.20)  - OCT shows interval improvement of VH OD, persistent IRF OU  - recommend IVTA OU #2 and PRP OD, today, 03.23.20  - pt wishes to proceed  - RBA of procedure discussed, questions answered  - informed consent obtained and signed  - see procedure notes  - f/u in 4 wks for f/u of VH OD   4,5. Hypertensive retinopathy OU - discussed importance of tight BP control and its relation to 1,2 above - monitor  6. Pseudophakia OU  - s/p CE/IOL OU  - doing well  - monitor  7. History of recent stroke - ischemic stroke on 12/26/2018 - pt reports some visual decline following stroke   Ophthalmic Meds Ordered this visit:  Meds ordered this encounter  Medications  . triamcinolone acetonide (KENALOG-40) injection 4 mg  . triamcinolone acetonide (KENALOG-40) injection 4 mg       Return in about 4 weeks (around 04/03/2019) for f/u VH OD, DFE, OCT.  There are no Patient Instructions on file for this visit.   Explained the diagnoses, plan, and follow up with the patient and they expressed understanding.  Patient expressed understanding of the importance of proper follow up care.   This  document serves as a record of services personally performed by Gardiner Sleeper, MD, PhD. It was created on their behalf by Ernest Mallick, OA, an ophthalmic assistant. The creation of this record is the provider's dictation and/or activities during the visit.    Electronically signed by: Ernest Mallick, OA  03.22.2020 11:44 PM   Gardiner Sleeper, M.D., Ph.D. Diseases & Surgery of the Retina and Vitreous [facility]  I have reviewed the above documentation for accuracy and completeness, and I agree with the above. Gardiner Sleeper, M.D., Ph.D. 03/06/19 11:49 PM   Abbreviations: M myopia (nearsighted); A astigmatism; H hyperopia (farsighted); P presbyopia; Mrx spectacle prescription;  CTL contact lenses; OD right eye; OS left eye; OU both eyes  XT exotropia; ET esotropia; PEK punctate epithelial keratitis; PEE punctate epithelial erosions; DES dry eye syndrome; MGD meibomian gland dysfunction; ATs artificial tears; PFAT's preservative free artificial tears; [place] nuclear sclerotic cataract; PSC posterior subcapsular cataract; ERM epi-retinal membrane; PVD posterior vitreous detachment; RD retinal detachment; DM diabetes mellitus; DR diabetic retinopathy; NPDR non-proliferative diabetic retinopathy; PDR proliferative diabetic retinopathy; CSME clinically significant macular edema; DME diabetic macular edema; dbh dot blot hemorrhages; CWS cotton wool spot; POAG primary open angle glaucoma; C/D cup-to-disc ratio; HVF humphrey visual field; GVF goldmann visual field; OCT optical coherence tomography; IOP intraocular pressure; BRVO Branch retinal vein occlusion; CRVO central retinal vein occlusion; CRAO central retinal artery occlusion; BRAO branch retinal artery occlusion; RT retinal tear; SB scleral buckle; PPV pars plana vitrectomy; VH Vitreous hemorrhage; PRP panretinal laser photocoagulation; IVK intravitreal kenalog; VMT vitreomacular traction; MH Macular hole;  NVD neovascularization of  the disc; NVE neovascularization elsewhere; AREDS age related eye disease study; ARMD age related macular degeneration; POAG primary open angle glaucoma; EBMD epithelial/anterior  basement membrane dystrophy; ACIOL anterior chamber intraocular lens; IOL intraocular lens; PCIOL posterior chamber intraocular lens; Phaco/IOL phacoemulsification with intraocular lens placement; [place] photorefractive keratectomy; LASIK laser assisted in situ keratomileusis; HTN hypertension; DM diabetes mellitus; COPD chronic obstructive pulmonary disease

## 2019-03-06 ENCOUNTER — Other Ambulatory Visit: Payer: Self-pay

## 2019-03-06 ENCOUNTER — Encounter (INDEPENDENT_AMBULATORY_CARE_PROVIDER_SITE_OTHER): Payer: Self-pay | Admitting: Ophthalmology

## 2019-03-06 ENCOUNTER — Ambulatory Visit (INDEPENDENT_AMBULATORY_CARE_PROVIDER_SITE_OTHER): Payer: Medicare Other | Admitting: Ophthalmology

## 2019-03-06 DIAGNOSIS — H4311 Vitreous hemorrhage, right eye: Secondary | ICD-10-CM | POA: Diagnosis not present

## 2019-03-06 DIAGNOSIS — H35033 Hypertensive retinopathy, bilateral: Secondary | ICD-10-CM

## 2019-03-06 DIAGNOSIS — H3581 Retinal edema: Secondary | ICD-10-CM | POA: Diagnosis not present

## 2019-03-06 DIAGNOSIS — E113513 Type 2 diabetes mellitus with proliferative diabetic retinopathy with macular edema, bilateral: Secondary | ICD-10-CM

## 2019-03-06 DIAGNOSIS — I10 Essential (primary) hypertension: Secondary | ICD-10-CM

## 2019-03-06 DIAGNOSIS — Z961 Presence of intraocular lens: Secondary | ICD-10-CM

## 2019-03-06 DIAGNOSIS — Z8673 Personal history of transient ischemic attack (TIA), and cerebral infarction without residual deficits: Secondary | ICD-10-CM

## 2019-03-06 MED ORDER — TRIAMCINOLONE ACETONIDE 40 MG/ML IJ SUSP FOR KALEIDOSCOPE
4.0000 mg | INTRAMUSCULAR | Status: AC | PRN
  Administered 2019-03-06: 4 mg via INTRAVITREAL

## 2019-04-05 ENCOUNTER — Encounter (INDEPENDENT_AMBULATORY_CARE_PROVIDER_SITE_OTHER): Payer: Medicare Other | Admitting: Ophthalmology

## 2019-04-19 ENCOUNTER — Encounter (INDEPENDENT_AMBULATORY_CARE_PROVIDER_SITE_OTHER): Payer: Medicare Other | Admitting: Ophthalmology

## 2019-06-25 NOTE — Progress Notes (Signed)
[facility] Note  06/26/2019     CHIEF COMPLAINT Patient presents for Retina Evaluation   HISTORY OF PRESENT ILLNESS: Desiree Pollard is a 73 y.o. female who presents to the clinic today for:   HPI    Retina Evaluation    In both eyes.  This started weeks ago.  Duration of weeks.  Context:  distance vision.  I, the attending physician,  performed the HPI with the patient and updated documentation appropriately.          Comments    Last BS: 108--patient felt like she was going to pass out and needed juice.  Mary checked BS and BS was 55.  After orange juice and crackers, re check showed 108. Last HgA1c: unknown Patient states her vision is stable in both eyes.  Patient denies eye pain or discomfort and denies any new or worsening floaters or fol.       Last edited by Bernarda Caffey, MD on 06/26/2019  3:37 PM. (History)    pt is with her sister today, her sister states that she is delayed to follow up due to covid, she states the nursing home would not let her come to her appts, her sister states she is now back home with her husband  Referring physician: No referring provider defined for this encounter.  HISTORICAL INFORMATION:   Selected notes from the MEDICAL RECORD NUMBER Referred by Dr. Angelena Form for DM exam LEE: 02.04.20 (A. Hager) [BCVA: OD: 20/400 OS: 20/150] Ocular Hx-ptosis OU, pseudo OU, diabetic ret OU PMH-hx of stroke (1 month), cancer, DM    CURRENT MEDICATIONS: Current Outpatient Medications (Ophthalmic Drugs)  Medication Sig  . dorzolamide-timolol (COSOPT) 22.3-6.8 MG/ML ophthalmic solution Place 1 drop into both eyes 2 (two) times daily.   No current facility-administered medications for this visit.  (Ophthalmic Drugs)   Current Outpatient Medications (Other)  Medication Sig  . aspirin EC 81 MG tablet Take by mouth.  Marland Kitchen atorvastatin (LIPITOR) 80 MG tablet Take by mouth.  . Blood Glucose Monitoring Suppl (FIFTY50 GLUCOSE METER  2.0) w/Device KIT Use to check blood sugars. Freestyle glucose meter. Okay to substitute for other meter preferred by insurance. ICD-10 E11.9.  Marland Kitchen carvedilol (COREG) 25 MG tablet Take by mouth.  . donepezil (ARICEPT) 10 MG tablet Take by mouth.  . ferrous sulfate 325 (65 FE) MG tablet Take by mouth.  Marland Kitchen glucose blood ([employer] TEST STRIPS) test strip Use to check blood sugars 3 times daily. Freestyle test strips. Ok to substitute for other strips as preferred by insurance. ICD-10 E11.9.  . insulin lispro (HUMALOG) 100 UNIT/ML injection Inject into the skin.  Marland Kitchen insulin NPH-regular Human (70-30) 100 UNIT/ML injection Inject into the skin.  Marland Kitchen lisinopril (PRINIVIL,ZESTRIL) 20 MG tablet Take 40 mg by mouth daily.  . meclizine (ANTIVERT) 25 MG tablet Take 1 tablet (25 mg total) by mouth 3 (three) times daily as needed for dizziness.   Current Facility-Administered Medications (Other)  Medication Route  . triamcinolone acetonide (KENALOG-40) injection 4 mg Intravitreal  . triamcinolone acetonide (KENALOG-40) injection 4 mg Intravitreal      REVIEW OF SYSTEMS: ROS    Positive for: Endocrine, Eyes   Negative for: Constitutional, Gastrointestinal, Neurological, Skin, Genitourinary, Musculoskeletal, HENT, Cardiovascular, Respiratory, Psychiatric, Allergic/Imm, Heme/Lymph   Last edited by Doneen Poisson on 06/26/2019  2:34 PM. (History)       ALLERGIES No Known Allergies  PAST MEDICAL HISTORY Past Medical History:  Diagnosis Date  .  Cancer ([place])   . CVA (cerebral vascular accident) ([place])   . Diabetes mellitus without complication ([place])   . Stroke [facility])    Past Surgical History:  Procedure Laterality Date  . CATARACT EXTRACTION    . EYE SURGERY    . MASTECTOMY Bilateral     FAMILY HISTORY History reviewed. No pertinent family history.  SOCIAL HISTORY Social History   Tobacco Use  . Smoking status: Former Research scientist (life sciences)  . Smokeless tobacco: Never Used  Substance Use Topics  . Alcohol  use: Not Currently  . Drug use: Never         OPHTHALMIC EXAM:  Base Eye Exam    Visual Acuity (Snellen - Linear)      Right Left   Dist cc 20/50 -2 20/80 -2   Dist ph cc NI NI       Tonometry (Tonopen, 2:37 PM)      Right Left   Pressure 28 34       Tonometry #2 (Tonopen, 3:42 PM)      Right Left   Pressure 23 26       Tonometry #3 (Tonopen, 3:42 PM)      Right Left   Pressure 25 27       Pupils      Dark Light Shape React APD   Right 2 1 Round Minimal 0   Left 2 1 Round Minimal 0       Extraocular Movement      Right Left    Full Full       Neuro/Psych    Oriented x3: Yes   Mood/Affect: Normal       Dilation    Both eyes: 1.0% Mydriacyl, 2.5% Phenylephrine @ 2:37 PM        Slit Lamp and Fundus Exam    Slit Lamp Exam      Right Left   Lids/Lashes Dermatochalasis - upper lid, nasal Ectropion - lower lid Dermatochalasis - upper lid, Meibomian gland dysfunction, mild, nasal Ectropion, lower lid   Conjunctiva/Sclera nasal and temporal Pinguecula, Melanosis nasal and temporal Pinguecula, Melanosis   Cornea mild Arcus, Well healed cataract wounds, tear film debris mild Arcus, Well healed cataract wounds, 1+ Punctate epithelial erosions   Anterior Chamber deep and clear deep and clear   Iris Round and moderately dilated, No NVI Round and moderately dilated, No NVI   Lens Posterior chamber intraocular lens, trace Posterior capsular opacification Posterior chamber intraocular lens, trace Posterior capsular opacification   Vitreous Vitreous syneresis, vast improvement in VH -- clearing centrally and settling inferiorly Vitreous syneresis       Fundus Exam      Right Left   Disc mild Pallor, sharp rim, ?NVD, fibrosis superiorly and surrounding IRH Pink and sharp, fine NVD - regressing, sharp rim   C/D Ratio 0.1 0.2   Macula Blunted foveal reflex, +ERM, scattered exudates/MA, +fibrosis Blunted foveal reflex, Epiretinal membrane, NVE, scattered IRH/exudates -  improved, CWS IN to macula, pseudo hole/macular cyst   Vessels Vascular attenuation, Tortuous, +fibrosis along ST arcades severe Vascular attenuation   Periphery Attached, scattered IRH/MA, very light PRP scars Attached, scattered IRH, good 360 PRP, room for fill-in          IMAGING AND PROCEDURES  Imaging and Procedures for @TODAY @  OCT, Retina - OU - Both Eyes       Right Eye Quality was borderline. Central Foveal Thickness: 402. Progression has improved. Findings include abnormal foveal contour, intraretinal fluid, intraretinal hyper-reflective  material, no SRF, outer retinal atrophy, epiretinal membrane (Interval improvement in IRF).   Left Eye Quality was good. Central Foveal Thickness: 364. Progression has improved. Findings include abnormal foveal contour, subretinal fluid, intraretinal fluid, epiretinal membrane (Mild interval decrease in IRF with traction).   Notes *Images captured and stored on drive  Diagnosis / Impression:  DME OU Mild ERM OU OD: interval improvement in IRF OS: mild interval decrease in IRF with traction   Clinical management:  See below  Abbreviations: NFP - Normal foveal profile. CME - cystoid macular edema. PED - pigment epithelial detachment. IRF - intraretinal fluid. SRF - subretinal fluid. EZ - ellipsoid zone. ERM - epiretinal membrane. ORA - outer retinal atrophy. ORT - outer retinal tubulation. SRHM - subretinal hyper-reflective material                 ASSESSMENT/PLAN:    ICD-10-CM   1. Proliferative diabetic retinopathy of both eyes with macular edema associated with type 2 diabetes mellitus ([place])  I01.6553   2. Vitreous hemorrhage of right eye ([place])  H43.11   3. Retinal edema  H35.81 OCT, Retina - OU - Both Eyes  4. Essential hypertension  I10   5. Hypertensive retinopathy of both eyes  H35.033   6. Pseudophakia of both eyes  Z96.1   7. History of stroke  Z86.73     1-3. Proliferative diabetic retinopathy w/ DME OU  -  delayed f/u today -- last visit was 3.23.2020 due to [place] restrictions  - VH OD onset 2.25.20 -- clearing centrally and settling inferiorly  - The incidence, risk factors for progression, natural history and treatment options for diabetic retinopathy were discussed with patient.    - The need for close monitoring of blood glucose, blood pressure, and serum lipids, avoiding cigarette or any type of tobacco, and the need for long term follow up was also discussed with patient.  - difficult exam due to poor patient cooperation -- history of stroke --   central edema OU and scattered IRH; +fine NVD OS; early flat NVE OD  - FA - unable to obtain 02.18.20  - discussed findings, prognosis and treatment options for DME and proliferative diabetic retinopathy  - in particular, discussed that given recent stroke, anti-VEGF agents should be avoided  - s/p PRP OS (02.18.20)  - s/p PRP OD (03.23.30) -- light PRP scars in place  - s/p IVTA OU #1 (02.25.20), #2 (03.23.20)  - IOP elevated OU -- ?steroid response?  - OCT shows interval improvement of VH OD, persistent but improved IRF OU  - pt needs intravitreal injections and fill in PRP OU -- holding on intervention until IOP improved  - consider switch to anti-VEGF as pt now 6 mos out from stroke  - f/u in 2 weeks for IOP check and PDR f/u  4,5. Hypertensive retinopathy OU  - discussed importance of tight BP control and its relation to 1,2 above  - monitor  6. Pseudophakia OU  - s/p CE/IOL OU  - doing well  - monitor  7. History of recent stroke  - ischemic stroke on 12/26/2018  - pt reports some visual decline following stroke  8. Ocular Hypertension OU  - IOP: OD: 25 OS: 29 today  - start Cosopt BID OU  - ?steroid response  - recheck IOP in 2 wks    Ophthalmic Meds Ordered this visit:  Meds ordered this encounter  Medications  . dorzolamide-timolol (COSOPT) 22.3-6.8 MG/ML ophthalmic solution  Sig: Place 1 drop into both  eyes 2 (two) times daily.    Dispense:  10 mL    Refill:  3       Return in about 2 weeks (around 07/10/2019) for f/u PDR OU, DFE, OCT.  There are no Patient Instructions on file for this visit.   Explained the diagnoses, plan, and follow up with the patient and they expressed understanding.  Patient expressed understanding of the importance of proper follow up care.   This document serves as a record of services personally performed by Gardiner Sleeper, MD, PhD. It was created on their behalf by Ernest Mallick, OA, an ophthalmic assistant. The creation of this record is the provider's dictation and/or activities during the visit.    Electronically signed by: Ernest Mallick, OA 07.12.2020 5:34 PM    Gardiner Sleeper, M.D., Ph.D. Diseases & Surgery of the Retina and Vitreous [facility]  I have reviewed the above documentation for accuracy and completeness, and I agree with the above. Gardiner Sleeper, M.D., Ph.D. 06/26/19 5:40 PM    Abbreviations: M myopia (nearsighted); A astigmatism; H hyperopia (farsighted); P presbyopia; Mrx spectacle prescription;  CTL contact lenses; OD right eye; OS left eye; OU both eyes  XT exotropia; ET esotropia; PEK punctate epithelial keratitis; PEE punctate epithelial erosions; DES dry eye syndrome; MGD meibomian gland dysfunction; ATs artificial tears; PFAT's preservative free artificial tears; [place] nuclear sclerotic cataract; PSC posterior subcapsular cataract; ERM epi-retinal membrane; PVD posterior vitreous detachment; RD retinal detachment; DM diabetes mellitus; DR diabetic retinopathy; NPDR non-proliferative diabetic retinopathy; PDR proliferative diabetic retinopathy; CSME clinically significant macular edema; DME diabetic macular edema; dbh dot blot hemorrhages; CWS cotton wool spot; POAG primary open angle glaucoma; C/D cup-to-disc ratio; HVF humphrey visual field; GVF goldmann visual field; OCT optical coherence tomography; IOP  intraocular pressure; BRVO Branch retinal vein occlusion; CRVO central retinal vein occlusion; CRAO central retinal artery occlusion; BRAO branch retinal artery occlusion; RT retinal tear; SB scleral buckle; PPV pars plana vitrectomy; VH Vitreous hemorrhage; PRP panretinal laser photocoagulation; IVK intravitreal kenalog; VMT vitreomacular traction; MH Macular hole;  NVD neovascularization of the disc; NVE neovascularization elsewhere; AREDS age related eye disease study; ARMD age related macular degeneration; POAG primary open angle glaucoma; EBMD epithelial/anterior basement membrane dystrophy; ACIOL anterior chamber intraocular lens; IOL intraocular lens; PCIOL posterior chamber intraocular lens; Phaco/IOL phacoemulsification with intraocular lens placement; [place] photorefractive keratectomy; LASIK laser assisted in situ keratomileusis; HTN hypertension; DM diabetes mellitus; COPD chronic obstructive pulmonary disease

## 2019-06-26 ENCOUNTER — Ambulatory Visit (INDEPENDENT_AMBULATORY_CARE_PROVIDER_SITE_OTHER): Payer: Medicare Other | Admitting: Ophthalmology

## 2019-06-26 ENCOUNTER — Other Ambulatory Visit: Payer: Self-pay

## 2019-06-26 ENCOUNTER — Encounter (INDEPENDENT_AMBULATORY_CARE_PROVIDER_SITE_OTHER): Payer: Self-pay | Admitting: Ophthalmology

## 2019-06-26 DIAGNOSIS — H4311 Vitreous hemorrhage, right eye: Secondary | ICD-10-CM

## 2019-06-26 DIAGNOSIS — H3581 Retinal edema: Secondary | ICD-10-CM | POA: Diagnosis not present

## 2019-06-26 DIAGNOSIS — E113513 Type 2 diabetes mellitus with proliferative diabetic retinopathy with macular edema, bilateral: Secondary | ICD-10-CM | POA: Diagnosis not present

## 2019-06-26 DIAGNOSIS — H35033 Hypertensive retinopathy, bilateral: Secondary | ICD-10-CM

## 2019-06-26 DIAGNOSIS — Z8673 Personal history of transient ischemic attack (TIA), and cerebral infarction without residual deficits: Secondary | ICD-10-CM

## 2019-06-26 DIAGNOSIS — I10 Essential (primary) hypertension: Secondary | ICD-10-CM | POA: Diagnosis not present

## 2019-06-26 DIAGNOSIS — Z961 Presence of intraocular lens: Secondary | ICD-10-CM

## 2019-06-26 DIAGNOSIS — H40053 Ocular hypertension, bilateral: Secondary | ICD-10-CM

## 2019-06-26 MED ORDER — DORZOLAMIDE HCL-TIMOLOL MAL 2-0.5 % OP SOLN: 1.0000 [drp] | Freq: Two times a day (BID) | OPHTHALMIC | 3 refills | Status: DC

## 2019-07-13 NOTE — Progress Notes (Addendum)
[facility] Note  07/14/2019     CHIEF COMPLAINT Patient presents for Retina Follow Up   HISTORY OF PRESENT ILLNESS: Desiree Pollard is a 73 y.o. female who presents to the clinic today for:   HPI    Retina Follow Up    Patient presents with  Diabetic Retinopathy.  In both eyes.  This started months ago.  Severity is moderate.  Duration of 2.5 weeks.  Since onset it is stable.  I, the attending physician,  performed the HPI with the patient and updated documentation appropriately.          Comments    73 y/o female pt here for 2.5 wk f/u for PDR OU.  No noticed change in [place] OU.  Denies pain, flashes, floaters.  Cosopt BID OU.  Reports BS this morning was "18."  A1C unknown.       Last edited by Bernarda Caffey, MD on 07/14/2019  2:13 PM. (History)    No noticed change in [place] since last visit.  Referring physician: No referring provider defined for this encounter.  HISTORICAL INFORMATION:   Selected notes from the MEDICAL RECORD NUMBER Referred by Dr. Angelena Form for DM exam LEE: 02.04.20 (A. Hager) [BCVA: OD: 20/400 OS: 20/150] Ocular Hx-ptosis OU, pseudo OU, diabetic ret OU PMH-hx of stroke (1 month), cancer, DM    CURRENT MEDICATIONS: Current Outpatient Medications (Ophthalmic Drugs)  Medication Sig  . dorzolamide-timolol (COSOPT) 22.3-6.8 MG/ML ophthalmic solution Place 1 drop into both eyes 2 (two) times daily.   No current facility-administered medications for this visit.  (Ophthalmic Drugs)   Current Outpatient Medications (Other)  Medication Sig  . Accu-Chek FastClix Lancets MISC USE AS DIRECTED TO TEST BLOOD GLUCOSE DAILY  . amLODipine (NORVASC) 5 MG tablet Take by mouth.  Marland Kitchen aspirin EC 81 MG tablet Take by mouth.  Marland Kitchen atorvastatin (LIPITOR) 80 MG tablet Take by mouth.  . Blood Glucose Monitoring Suppl (FIFTY50 GLUCOSE METER 2.0) w/Device KIT Use to check blood sugars. Freestyle glucose meter. Okay to substitute for other meter preferred  by insurance. ICD-10 E11.9.  Marland Kitchen carvedilol (COREG) 25 MG tablet Take by mouth.  . cefTRIAXone (ROCEPHIN) 1 g injection   . cephALEXin (KEFLEX) 500 MG capsule   . donepezil (ARICEPT) 10 MG tablet Take by mouth.  . enalapril (VASOTEC) 5 MG tablet Take 5 mg by mouth daily.  . ferrous sulfate 325 (65 FE) MG tablet Take by mouth.  Marland Kitchen glucose blood ([employer] TEST STRIPS) test strip Use to check blood sugars 3 times daily. Freestyle test strips. Ok to substitute for other strips as preferred by insurance. ICD-10 E11.9.  . insulin lispro (HUMALOG) 100 UNIT/ML injection Inject into the skin.  Marland Kitchen insulin NPH-regular Human (70-30) 100 UNIT/ML injection Inject into the skin.  . Insulin Pen Needle (FIFTY50 PEN NEEDLES) 32G X 4 MM MISC Needles for insulin pen. Inject as instructed E11.9  . KLOR-CON M20 20 MEQ tablet Take 20 mEq by mouth daily.  Marland Kitchen lisinopril (PRINIVIL,ZESTRIL) 20 MG tablet Take 40 mg by mouth daily.  Marland Kitchen loperamide (IMODIUM) 2 MG capsule Take by mouth.  . meclizine (ANTIVERT) 25 MG tablet Take 1 tablet (25 mg total) by mouth 3 (three) times daily as needed for dizziness.  . ondansetron (ZOFRAN) 4 MG tablet Take 4 mg by mouth every 6 (six) hours as needed.   Current Facility-Administered Medications (Other)  Medication Route  . triamcinolone acetonide (KENALOG-40) injection 4 mg Intravitreal  . triamcinolone  acetonide (KENALOG-40) injection 4 mg Intravitreal      REVIEW OF SYSTEMS: ROS    Positive for: Endocrine, Eyes   Negative for: Constitutional, Gastrointestinal, Neurological, Skin, Genitourinary, Musculoskeletal, HENT, Cardiovascular, Respiratory, Psychiatric, Allergic/Imm, Heme/Lymph   Last edited by Matthew Folks, COA on 07/14/2019  1:54 PM. (History)       ALLERGIES No Known Allergies  PAST MEDICAL HISTORY Past Medical History:  Diagnosis Date  . Cancer ([place])   . CVA (cerebral vascular accident) ([place])   . Diabetes mellitus without complication ([place])   . Diabetic  retinopathy ([place])    PDR OU  . Hypertensive retinopathy    OU  . Stroke [facility])    Past Surgical History:  Procedure Laterality Date  . CATARACT EXTRACTION    . EYE SURGERY    . MASTECTOMY Bilateral     FAMILY HISTORY History reviewed. No pertinent family history.  SOCIAL HISTORY Social History   Tobacco Use  . Smoking status: Former Research scientist (life sciences)  . Smokeless tobacco: Never Used  Substance Use Topics  . Alcohol use: Not Currently  . Drug use: Never         OPHTHALMIC EXAM:  Base Eye Exam    Visual Acuity (Snellen - Linear)      Right Left   Dist cc 20/50 +2 20/70 -2   Dist ph cc NI NI   Correction: Glasses       Tonometry (Tonopen, 2:15 PM)      Right Left   Pressure 16 15       Pupils      Dark Light Shape React APD   Right 2 1 Round Minimal None   Left 2 1 Round Minimal None       Visual Fields (Counting fingers)      Left Right     Full   Restrictions Partial outer inferior temporal deficiency        Extraocular Movement      Right Left    Full, Ortho Full, Ortho       Neuro/Psych    Oriented x3: Yes   Mood/Affect: Normal       Dilation    Both eyes: 1.0% Mydriacyl, 2.5% Phenylephrine @ 2:15 PM        Slit Lamp and Fundus Exam    Slit Lamp Exam      Right Left   Lids/Lashes Dermatochalasis - upper lid, nasal Ectropion - lower lid Dermatochalasis - upper lid, Meibomian gland dysfunction, mild, nasal Ectropion, lower lid   Conjunctiva/Sclera nasal and temporal Pinguecula, Melanosis nasal and temporal Pinguecula, Melanosis   Cornea mild Arcus, Well healed cataract wounds, tear film debris mild Arcus, Well healed cataract wounds, 1+ Punctate epithelial erosions   Anterior Chamber deep and clear deep and clear   Iris Round and moderately dilated, No NVI Round and moderately dilated, No NVI   Lens Posterior chamber intraocular lens, trace Posterior capsular opacification Posterior chamber intraocular lens, trace Posterior capsular opacification    Vitreous Vitreous syneresis, vast improvement in VH -- clearing centrally and settling inferiorly--mild residual old heme at 06:00. Vitreous syneresis       Fundus Exam      Right Left   Disc mild Pallor, sharp rim, ?NVD, fibrosis superiorly and surrounding IRH Pink and sharp, fine NVD - regressing, sharp rim   C/D Ratio 0.1 0.2   Macula Blunted foveal reflex, +ERM, scattered exudates/MA, +fibrosis Blunted foveal reflex, Epiretinal membrane, NVE, scattered IRH/exudates - improved, CWS IN to  macula, pseudo hole/macular cyst   Vessels Vascular attenuation, Tortuous, +fibrosis along ST arcades severe Vascular attenuation   Periphery Attached, scattered IRH/MA, very light PRP scars Attached, scattered IRH, good 360 PRP, room for fill-in          IMAGING AND PROCEDURES  Imaging and Procedures for _0 @  OCT, Retina - OU - Both Eyes       Right Eye Quality was good. Central Foveal Thickness: 408. Progression has been stable. Findings include abnormal foveal contour, intraretinal fluid, intraretinal hyper-reflective material, no SRF, outer retinal atrophy, epiretinal membrane (Persistent IRF).   Left Eye Quality was good. Central Foveal Thickness: 338. Progression has been stable. Findings include abnormal foveal contour, intraretinal fluid, epiretinal membrane, no SRF, preretinal fibrosis, vitreous traction (Persistent IRF with +VMT).   Notes *Images captured and stored on drive  Diagnosis / Impression:  DME OU Mild ERM OU OD: persistent IRF OS: persistent IRF, Pre-retinal fibrosis and VMT contributing to edema   Clinical management:  See below  Abbreviations: NFP - Normal foveal profile. CME - cystoid macular edema. PED - pigment epithelial detachment. IRF - intraretinal fluid. SRF - subretinal fluid. EZ - ellipsoid zone. ERM - epiretinal membrane. ORA - outer retinal atrophy. ORT - outer retinal tubulation. SRHM - subretinal hyper-reflective material        Intravitreal  Injection, Pharmacologic Agent - OD - Right Eye       Time Out 07/14/2019. 3:06 PM. Confirmed correct patient, procedure, site, and patient consented.   Anesthesia Topical anesthesia was used. Anesthetic medications included Lidocaine 2%, Proparacaine 0.5%.   Procedure Preparation included 5% betadine to ocular surface, eyelid speculum. A supplied needle was used.   Injection:  1.25 mg Bevacizumab (AVASTIN) SOLN   NDC: 40981-191-47, Lot: [id number] , Expiration date: 08/23/2019   Route: Intravitreal, Site: Right Eye, Waste: 0 mL  Post-op Post injection exam found visual acuity of at least counting fingers. The patient tolerated the procedure well. There were no complications. The patient received written and verbal post procedure care education.        Intravitreal Injection, Pharmacologic Agent - OS - Left Eye       Time Out 07/14/2019. 3:06 PM. Confirmed correct patient, procedure, site, and patient consented.   Anesthesia Topical anesthesia was used. Anesthetic medications included Lidocaine 2%, Proparacaine 0.5%.   Procedure Preparation included 5% betadine to ocular surface, eyelid speculum. A 30 gauge needle was used.   Injection:  1.25 mg Bevacizumab (AVASTIN) SOLN   NDC: 82956-213-08, Lot: [id number] , Expiration date: 09/14/2019   Route: Intravitreal, Site: Left Eye, Waste: 0 mL  Post-op Post injection exam found visual acuity of at least counting fingers. The patient tolerated the procedure well. There were no complications. The patient received written and verbal post procedure care education.                 ASSESSMENT/PLAN:    ICD-10-CM   1. Proliferative diabetic retinopathy of both eyes with macular edema associated with type 2 diabetes mellitus (HCC)  F64.3329 Intravitreal Injection, Pharmacologic Agent - OD - Right Eye    Intravitreal Injection, Pharmacologic Agent - OS - Left Eye    Bevacizumab (AVASTIN) SOLN 1.25 mg    Bevacizumab (AVASTIN)  SOLN 1.25 mg  2. Vitreous hemorrhage of right eye ([place])  H43.11   3. 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 Essential hypertension  I10   6.  Hypertensive retinopathy of both eyes  H35.033   7. Pseudophakia of both eyes  Z96.1   8. History of stroke  Z86.73   9. Ocular hypertension, bilateral  H40.053     1-3. Proliferative diabetic retinopathy w/ DME OU  - lost to f/u 3.23.2020 to 7.13.2020 due to [place] restrictions  - VH OD onset 2.25.20 -- clearing centrally and settling inferiorly  - The incidence, risk factors for progression, natural history and treatment options for diabetic retinopathy were discussed with patient.    - The need for close monitoring of blood glucose, blood pressure, and serum lipids, avoiding cigarette or any type of tobacco, and the need for long term follow up was also discussed with patient.  - difficult exam due to poor patient cooperation -- history of stroke --   central edema OU and scattered IRH; +fine NVD OS; early flat NVE OD  - FA - unable to obtain 02.18.20  - discussed findings, prognosis and treatment options for DME and proliferative diabetic retinopathy  - s/p PRP OS (02.18.20)  - s/p PRP OD (03.23.30) -- light PRP scars in place  - s/p IVTA OU #1 (02.25.20), #2 (03.23.20)  - IOP improved today on Cosopt BID OU  - OCT shows interval improvement of VH OD, persistent IRF OU  - recommend IVA OU #1 today for DME  - RBA of procedure discussed, questions answered  - informed consent obtained and signed  - see procedure note  - f/u in 2 weeks for DFE/OCT repeat Optos FA (transit OD), likely PRP fill-in.  4. VMT OS - stable VMT, contributing to central edema OS - monitor  5,6. Hypertensive retinopathy OU  - discussed importance of tight BP control and its relation to 1,2 above  - monitor  7. Pseudophakia OU  - s/p CE/IOL OU  - doing well  - monitor  8. History of  stroke  - ischemic stroke on 12/26/2018  - pt reports some visual decline following stroke  +. Ocular Hypertension OU  - ?steroid response  - IOP improved today (16,15) on Cosopt BID OU -- continue  - monitor    Ophthalmic Meds Ordered this visit:  Meds ordered this encounter  Medications  . Bevacizumab (AVASTIN) SOLN 1.25 mg  . Bevacizumab (AVASTIN) SOLN 1.25 mg       Return in about 2 weeks (around 07/28/2019) for Dilated Exam, OCT, Fluorescein Angiogram, Laser.  There are no Patient Instructions on file for this visit.   Explained the diagnoses, plan, and follow up with the patient and they expressed understanding.  Patient expressed understanding of the importance of proper follow up care.   This document serves as a record of services personally performed by Gardiner Sleeper, MD, PhD. It was created on their behalf by Ernest Mallick, OA, an ophthalmic assistant. The creation of this record is the provider's dictation and/or activities during the visit.    Electronically signed by: Ernest Mallick, OA  07.30.20 3:07 PM     Gardiner Sleeper, M.D., Ph.D. Diseases & Surgery of the Retina and Vitreous [facility]  I have reviewed the above documentation for accuracy and completeness, and I agree with the above. Gardiner Sleeper, M.D., Ph.D. 07/14/19 3:07 PM   Abbreviations: M myopia (nearsighted); A astigmatism; H hyperopia (farsighted); P presbyopia; Mrx spectacle prescription;  CTL contact lenses; OD right eye; OS left eye; OU both eyes  XT exotropia; ET esotropia; PEK punctate epithelial keratitis; PEE  punctate epithelial erosions; DES dry eye syndrome; MGD meibomian gland dysfunction; ATs artificial tears; PFAT's preservative free artificial tears; [place] nuclear sclerotic cataract; PSC posterior subcapsular cataract; ERM epi-retinal membrane; PVD posterior vitreous detachment; RD retinal detachment; DM diabetes mellitus; DR diabetic retinopathy; NPDR  non-proliferative diabetic retinopathy; PDR proliferative diabetic retinopathy; CSME clinically significant macular edema; DME diabetic macular edema; dbh dot blot hemorrhages; CWS cotton wool spot; POAG primary open angle glaucoma; C/D cup-to-disc ratio; HVF humphrey visual field; GVF goldmann visual field; OCT optical coherence tomography; IOP intraocular pressure; BRVO Branch retinal vein occlusion; CRVO central retinal vein occlusion; CRAO central retinal artery occlusion; BRAO branch retinal artery occlusion; RT retinal tear; SB scleral buckle; PPV pars plana vitrectomy; VH Vitreous hemorrhage; PRP panretinal laser photocoagulation; IVK intravitreal kenalog; VMT vitreomacular traction; MH Macular hole;  NVD neovascularization of the disc; NVE neovascularization elsewhere; AREDS age related eye disease study; ARMD age related macular degeneration; POAG primary open angle glaucoma; EBMD epithelial/anterior basement membrane dystrophy; ACIOL anterior chamber intraocular lens; IOL intraocular lens; PCIOL posterior chamber intraocular lens; Phaco/IOL phacoemulsification with intraocular lens placement; [place] photorefractive keratectomy; LASIK laser assisted in situ keratomileusis; HTN hypertension; DM diabetes mellitus; COPD chronic obstructive pulmonary disease

## 2019-07-14 ENCOUNTER — Ambulatory Visit (INDEPENDENT_AMBULATORY_CARE_PROVIDER_SITE_OTHER): Payer: Medicare Other | Admitting: Ophthalmology

## 2019-07-14 ENCOUNTER — Encounter (INDEPENDENT_AMBULATORY_CARE_PROVIDER_SITE_OTHER): Payer: Self-pay | Admitting: Ophthalmology

## 2019-07-14 ENCOUNTER — Other Ambulatory Visit: Payer: Self-pay

## 2019-07-14 DIAGNOSIS — H43822 Vitreomacular adhesion, left eye: Secondary | ICD-10-CM | POA: Diagnosis not present

## 2019-07-14 DIAGNOSIS — H35033 Hypertensive retinopathy, bilateral: Secondary | ICD-10-CM

## 2019-07-14 DIAGNOSIS — E113513 Type 2 diabetes mellitus with proliferative diabetic retinopathy with macular edema, bilateral: Secondary | ICD-10-CM

## 2019-07-14 DIAGNOSIS — Z8673 Personal history of transient ischemic attack (TIA), and cerebral infarction without residual deficits: Secondary | ICD-10-CM

## 2019-07-14 DIAGNOSIS — H4311 Vitreous hemorrhage, right eye: Secondary | ICD-10-CM | POA: Diagnosis not present

## 2019-07-14 DIAGNOSIS — H40053 Ocular hypertension, bilateral: Secondary | ICD-10-CM

## 2019-07-14 DIAGNOSIS — I10 Essential (primary) hypertension: Secondary | ICD-10-CM

## 2019-07-14 DIAGNOSIS — H3581 Retinal edema: Secondary | ICD-10-CM | POA: Diagnosis not present

## 2019-07-14 DIAGNOSIS — Z961 Presence of intraocular lens: Secondary | ICD-10-CM

## 2019-07-14 MED ORDER — BEVACIZUMAB CHEMO INJECTION 1.25MG/0.05ML SYRINGE FOR KALEIDOSCOPE
1.2500 mg | INTRAVITREAL | Status: AC | PRN
  Administered 2019-07-14: 1.25 mg via INTRAVITREAL

## 2019-08-02 ENCOUNTER — Other Ambulatory Visit: Payer: Self-pay

## 2019-08-02 ENCOUNTER — Ambulatory Visit (INDEPENDENT_AMBULATORY_CARE_PROVIDER_SITE_OTHER): Payer: Medicare Other | Admitting: Ophthalmology

## 2019-08-02 ENCOUNTER — Encounter (INDEPENDENT_AMBULATORY_CARE_PROVIDER_SITE_OTHER): Payer: Self-pay | Admitting: Ophthalmology

## 2019-08-02 DIAGNOSIS — H35033 Hypertensive retinopathy, bilateral: Secondary | ICD-10-CM

## 2019-08-02 DIAGNOSIS — H4311 Vitreous hemorrhage, right eye: Secondary | ICD-10-CM

## 2019-08-02 DIAGNOSIS — Z961 Presence of intraocular lens: Secondary | ICD-10-CM

## 2019-08-02 DIAGNOSIS — H43822 Vitreomacular adhesion, left eye: Secondary | ICD-10-CM

## 2019-08-02 DIAGNOSIS — E113513 Type 2 diabetes mellitus with proliferative diabetic retinopathy with macular edema, bilateral: Secondary | ICD-10-CM

## 2019-08-02 DIAGNOSIS — H40053 Ocular hypertension, bilateral: Secondary | ICD-10-CM

## 2019-08-02 DIAGNOSIS — Z8673 Personal history of transient ischemic attack (TIA), and cerebral infarction without residual deficits: Secondary | ICD-10-CM

## 2019-08-02 DIAGNOSIS — I10 Essential (primary) hypertension: Secondary | ICD-10-CM

## 2019-08-02 DIAGNOSIS — H3581 Retinal edema: Secondary | ICD-10-CM | POA: Diagnosis not present

## 2019-08-02 NOTE — Progress Notes (Signed)
[facility] Note  08/02/2019     CHIEF COMPLAINT Patient presents for Retina Follow Up   HISTORY OF PRESENT ILLNESS: Desiree Pollard is a 73 y.o. female who presents to the clinic today for:   HPI    Retina Follow Up    Patient presents with  Diabetic Retinopathy.  In both eyes.  Severity is moderate.  Duration of 3 weeks.  Since onset it is stable.  I, the attending physician,  performed the HPI with the patient and updated documentation appropriately.          Comments    Patient states vision the same OU. BS was 123 this am. Last a1c unknown. Using cosopt bid OU, last used last pm.        Last edited by Bernarda Caffey, MD on 08/02/2019  1:07 PM. (History)    No noticed change in [place] since last visit.  Referring physician: No referring provider defined for this encounter.  HISTORICAL INFORMATION:   Selected notes from the MEDICAL RECORD NUMBER Referred by Dr. Angelena Form for DM exam LEE: 02.04.20 (A. Hager) [BCVA: OD: 20/400 OS: 20/150] Ocular Hx-ptosis OU, pseudo OU, diabetic ret OU PMH-hx of stroke (1 month), cancer, DM    CURRENT MEDICATIONS: Current Outpatient Medications (Ophthalmic Drugs)  Medication Sig  . dorzolamide-timolol (COSOPT) 22.3-6.8 MG/ML ophthalmic solution Place 1 drop into both eyes 2 (two) times daily.   No current facility-administered medications for this visit.  (Ophthalmic Drugs)   Current Outpatient Medications (Other)  Medication Sig  . Accu-Chek FastClix Lancets MISC USE AS DIRECTED TO TEST BLOOD GLUCOSE DAILY  . amLODipine (NORVASC) 5 MG tablet Take by mouth.  Marland Kitchen aspirin EC 81 MG tablet Take by mouth.  Marland Kitchen atorvastatin (LIPITOR) 80 MG tablet Take by mouth.  . Blood Glucose Monitoring Suppl (FIFTY50 GLUCOSE METER 2.0) w/Device KIT Use to check blood sugars. Freestyle glucose meter. Okay to substitute for other meter preferred by insurance. ICD-10 E11.9.  Marland Kitchen carvedilol (COREG) 25 MG tablet Take by mouth.  .  cefTRIAXone (ROCEPHIN) 1 g injection   . cephALEXin (KEFLEX) 500 MG capsule   . donepezil (ARICEPT) 10 MG tablet Take by mouth.  . enalapril (VASOTEC) 5 MG tablet Take 5 mg by mouth daily.  . ferrous sulfate 325 (65 FE) MG tablet Take by mouth.  Marland Kitchen glucose blood ([employer] TEST STRIPS) test strip Use to check blood sugars 3 times daily. Freestyle test strips. Ok to substitute for other strips as preferred by insurance. ICD-10 E11.9.  . insulin lispro (HUMALOG) 100 UNIT/ML injection Inject into the skin.  Marland Kitchen insulin NPH-regular Human (70-30) 100 UNIT/ML injection Inject into the skin.  . Insulin Pen Needle (FIFTY50 PEN NEEDLES) 32G X 4 MM MISC Needles for insulin pen. Inject as instructed E11.9  . KLOR-CON M20 20 MEQ tablet Take 20 mEq by mouth daily.  Marland Kitchen lisinopril (PRINIVIL,ZESTRIL) 20 MG tablet Take 40 mg by mouth daily.  Marland Kitchen loperamide (IMODIUM) 2 MG capsule Take by mouth.  . meclizine (ANTIVERT) 25 MG tablet Take 1 tablet (25 mg total) by mouth 3 (three) times daily as needed for dizziness.  . ondansetron (ZOFRAN) 4 MG tablet Take 4 mg by mouth every 6 (six) hours as needed.   Current Facility-Administered Medications (Other)  Medication Route  . triamcinolone acetonide (KENALOG-40) injection 4 mg Intravitreal  . triamcinolone acetonide (KENALOG-40) injection 4 mg Intravitreal      REVIEW OF SYSTEMS: ROS    Positive for:  Endocrine, Eyes   Negative for: Constitutional, Gastrointestinal, Neurological, Skin, Genitourinary, Musculoskeletal, HENT, Cardiovascular, Respiratory, Psychiatric, Allergic/Imm, Heme/Lymph   Last edited by Roselee Nova D on 08/02/2019 12:20 PM. (History)       ALLERGIES No Known Allergies  PAST MEDICAL HISTORY Past Medical History:  Diagnosis Date  . Cancer ([place])   . CVA (cerebral vascular accident) ([place])   . Diabetes mellitus without complication ([place])   . Diabetic retinopathy ([place])    PDR OU  . Hypertensive retinopathy    OU  . Stroke [facility])    Past  Surgical History:  Procedure Laterality Date  . CATARACT EXTRACTION    . EYE SURGERY    . MASTECTOMY Bilateral     FAMILY HISTORY History reviewed. No pertinent family history.  SOCIAL HISTORY Social History   Tobacco Use  . Smoking status: Former Research scientist (life sciences)  . Smokeless tobacco: Never Used  Substance Use Topics  . Alcohol use: Not Currently  . Drug use: Never         OPHTHALMIC EXAM:  Base Eye Exam    Visual Acuity (Snellen - Linear)      Right Left   Dist [place] 20/60 +2 20/70 +2   Dist ph [place] 20/50 -2 20/70 -2   Correction: Glasses       Tonometry (Tonopen, 12:27 PM)      Right Left   Pressure 23 24       Pupils      Dark Light Shape React APD   Right 2 1 Round Minimal None   Left 2 1 Round Minimal None       Visual Fields (Counting fingers)      Left Right    Full Full       Extraocular Movement      Right Left    Full, Ortho Full, Ortho       Neuro/Psych    Oriented x3: Yes   Mood/Affect: Normal       Dilation    Both eyes: 1.0% Mydriacyl, 2.5% Phenylephrine @ 1:13 PM        Slit Lamp and Fundus Exam    Slit Lamp Exam      Right Left   Lids/Lashes Dermatochalasis - upper lid, nasal Ectropion - lower lid Dermatochalasis - upper lid, Meibomian gland dysfunction, mild, nasal Ectropion, lower lid   Conjunctiva/Sclera nasal and temporal Pinguecula, Melanosis nasal and temporal Pinguecula, Melanosis   Cornea mild Arcus, Well healed cataract wounds, tear film debris mild Arcus, Well healed cataract wounds, 1+ Punctate epithelial erosions   Anterior Chamber deep and clear deep and clear   Iris Round and dilated to 37m, No NVI Round and poorly dilated to 451m No NVI   Lens Posterior chamber intraocular lens, trace Posterior capsular opacification Posterior chamber intraocular lens, trace Posterior capsular opacification   Vitreous Vitreous syneresis, vast improvement in VH -- clearing centrally and settling inferiorly--mild residual old heme at 06:00.  Vitreous syneresis       Fundus Exam      Right Left   Disc mild Pallor, sharp rim, ?NVD, fibrosis superiorly and surrounding IRH Pink and sharp, fine NVD - regressing, sharp rim   C/D Ratio 0.1 0.2   Macula Blunted foveal reflex, +ERM, scattered exudates/MA, +fibrosis Blunted foveal reflex, Epiretinal membrane, NVE, scattered IRH/exudates - improved, CWS IN to macula, pseudo hole/macular cyst   Vessels Vascular attenuation, Tortuous, +fibrosis along ST arcades severe Vascular attenuation   Periphery Attached, scattered IRH/MA, light PRP scars  360 Attached, scattered IRH, 360 PRP, room for posterior fill-in        Refraction    Manifest Refraction      Sphere Cylinder Axis Dist VA   Right -0.25 +2.00 162 20/50+2   Left +0.25 +2.50 160 20/60+2          IMAGING AND PROCEDURES  Imaging and Procedures for _0 @  OCT, Retina - OU - Both Eyes       Right Eye Quality was good. Central Foveal Thickness: 380. Progression has improved. Findings include abnormal foveal contour, intraretinal fluid, intraretinal hyper-reflective material, no SRF, outer retinal atrophy, epiretinal membrane (Interval improvement in IRF).   Left Eye Quality was good. Central Foveal Thickness: 324. Progression has worsened. Findings include abnormal foveal contour, intraretinal fluid, epiretinal membrane, no SRF, preretinal fibrosis, vitreous traction (Mild interval increase in IRF with +VMT).   Notes *Images captured and stored on drive  Diagnosis / Impression:  DME OU Mild ERM OU OD: interval improvement in IRF OS: interval increase in IRF, Pre-retinal fibrosis and VMT contributing to edema   Clinical management:  See below  Abbreviations: NFP - Normal foveal profile. CME - cystoid macular edema. PED - pigment epithelial detachment. IRF - intraretinal fluid. SRF - subretinal fluid. EZ - ellipsoid zone. ERM - epiretinal membrane. ORA - outer retinal atrophy. ORT - outer retinal tubulation. SRHM -  subretinal hyper-reflective material        Fluorescein Angiography Optos (Transit OD)       Right Eye   Progression has no prior data. Early phase findings include microaneurysm, staining. Mid/Late phase findings include retinal neovascularization, microaneurysm, leakage, staining (Focal NV nasal to disc).   Left Eye   Progression has no prior data. Early phase findings include staining, retinal neovascularization, microaneurysm. Mid/Late phase findings include staining, leakage, microaneurysm, retinal neovascularization (Focal NV along ST and SN arcades).   Notes Images stored on drive;   Impression: PDR OU Leaking MA OU OD: Focal NV nasal to disc OS: Focal NV along ST and SN arcades         Panretinal Photocoagulation - OS - Left Eye       LASER PROCEDURE NOTE  Diagnosis:   Proliferative Diabetic Retinopathy, LEFT EYE  Procedure:  Pan-retinal photocoagulation using slit lamp laser, LEFT EYE, fill in  Anesthesia:  Topical  Surgeon: Bernarda Caffey, MD, PhD   Informed consent obtained, operative eye marked, and time out performed prior to initiation of laser.   Lumenis [id number] slit lamp laser Pattern: 2x2 square Power: 340 mW Duration: 50 msec  Spot size: 200 microns  # spots: 381 spots -- posterior fill in mostly superior and nasal  Complications: None.  RTC: 2 wks  Patient tolerated the procedure well and received written and verbal post-procedure care information/education.                  ASSESSMENT/PLAN:    ICD-10-CM   1. Proliferative diabetic retinopathy of both eyes with macular edema associated with type 2 diabetes mellitus (HCC)  E95.2841 Fluorescein Angiography Optos (Transit OD)    Panretinal Photocoagulation - OS - Left Eye  2. Vitreous hemorrhage of right eye (HCC)  H43.11 Fluorescein Angiography Optos (Transit OD)  3. 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  Essential hypertension  I10   6. Hypertensive retinopathy of both eyes  H35.033 Fluorescein Angiography Optos (Transit OD)  7. Pseudophakia  of both eyes  Z96.1   8. History of stroke  Z86.73   9. Ocular hypertension, bilateral  H40.053     1-3. Proliferative diabetic retinopathy w/ DME OU  - lost to f/u 3.23.2020 to 7.13.2020 due to [place] restrictions  - VH OD onset 2.25.20 -- clearing centrally and settling inferiorly  - The incidence, risk factors for progression, natural history and treatment options for diabetic retinopathy were discussed with patient.    - The need for close monitoring of blood glucose, blood pressure, and serum lipids, avoiding cigarette or any type of tobacco, and the need for long term follow up was also discussed with patient.  - difficult exam due to poor patient cooperation -- history of stroke --   central edema OU and scattered IRH; +fine NVD OS; early flat NVE OD  - FA (08.19.20) shows NV nasal to disc OD; OS with NV along SN and ST arcades  - discussed findings, prognosis and treatment options for DME and proliferative diabetic retinopathy  - s/p PRP OS (02.18.20)  - s/p PRP OD (03.23.30) -- light PRP scars in place  - s/p IVTA OU #1 (02.25.20), #2 (03.23.20)  - s/p IVA OU #1 (07.31.20)  - IOP 23,24 today  - cont Cosopt BID OU  - OCT shows interval improvement of IRF OD, mild interval increase in IRF with VMT OS  - would benefit from PRP fill in OU  - recommend PRP fill in OS today (08.19.20)  - pt wishes to proceed  - RBA of procedure discussed, questions answered  - informed consent obtained and signed  - see procedure note  - start Inveltys QID OS x5 days  - f/u 2 weeks, DFE, OCT, IVA OU  4. VMT OS  - stable VMT, contributing to central edema OS  - monitor  5,6. Hypertensive retinopathy OU  - discussed importance of tight BP control and its relation to 1,2 above  - monitor  7. Pseudophakia OU  - s/p CE/IOL OU  - doing well  -  monitor  8. History of stroke  - ischemic stroke on 12/26/2018  - pt reports some visual decline following stroke  9. Ocular Hypertension OU  - ?steroid response  - IOP 23,24 on Cosopt BID OU -- continue  - monitor    Ophthalmic Meds Ordered this visit:  No orders of the defined types were placed in this encounter.      Return in about 2 weeks (around 08/16/2019) for f/u PDR OU, DFE, OCT.  There are no Patient Instructions on file for this visit.   Explained the diagnoses, plan, and follow up with the patient and they expressed understanding.  Patient expressed understanding of the importance of proper follow up care.   This document serves as a record of services personally performed by Gardiner Sleeper, MD, PhD. It was created on their behalf by Ernest Mallick, OA, an ophthalmic assistant. The creation of this record is the provider's dictation and/or activities during the visit.    Electronically signed by: Ernest Mallick, OA  08.19.2020 2:55 PM    Gardiner Sleeper, M.D., Ph.D. Diseases & Surgery of the Retina and Vitreous [facility]  I have reviewed the above documentation for accuracy and completeness, and I agree with the above. Gardiner Sleeper, M.D., Ph.D. 08/02/19 2:55 PM    Abbreviations: M myopia (nearsighted); A astigmatism; H hyperopia (farsighted); P presbyopia; Mrx spectacle prescription;  CTL contact lenses; OD  right eye; OS left eye; OU both eyes  XT exotropia; ET esotropia; PEK punctate epithelial keratitis; PEE punctate epithelial erosions; DES dry eye syndrome; MGD meibomian gland dysfunction; ATs artificial tears; PFAT's preservative free artificial tears; Rentchler nuclear sclerotic cataract; PSC posterior subcapsular cataract; ERM epi-retinal membrane; PVD posterior vitreous detachment; RD retinal detachment; DM diabetes mellitus; DR diabetic retinopathy; NPDR non-proliferative diabetic retinopathy; PDR proliferative diabetic retinopathy; CSME  clinically significant macular edema; DME diabetic macular edema; dbh dot blot hemorrhages; CWS cotton wool spot; POAG primary open angle glaucoma; C/D cup-to-disc ratio; HVF humphrey visual field; GVF goldmann visual field; OCT optical coherence tomography; IOP intraocular pressure; BRVO Branch retinal vein occlusion; CRVO central retinal vein occlusion; CRAO central retinal artery occlusion; BRAO branch retinal artery occlusion; RT retinal tear; SB scleral buckle; PPV pars plana vitrectomy; VH Vitreous hemorrhage; PRP panretinal laser photocoagulation; IVK intravitreal kenalog; VMT vitreomacular traction; MH Macular hole;  NVD neovascularization of the disc; NVE neovascularization elsewhere; AREDS age related eye disease study; ARMD age related macular degeneration; POAG primary open angle glaucoma; EBMD epithelial/anterior basement membrane dystrophy; ACIOL anterior chamber intraocular lens; IOL intraocular lens; PCIOL posterior chamber intraocular lens; Phaco/IOL phacoemulsification with intraocular lens placement; Krebs photorefractive keratectomy; LASIK laser assisted in situ keratomileusis; HTN hypertension; DM diabetes mellitus; COPD chronic obstructive pulmonary disease

## 2019-08-14 NOTE — Progress Notes (Signed)
[facility] - Clinic Note  08/16/2019     CHIEF COMPLAINT Patient presents for Retina Follow Up   HISTORY OF PRESENT ILLNESS: Desiree Pollard is a 73 y.o. female who presents to the clinic today for:   HPI    Retina Follow Up    Patient presents with  Diabetic Retinopathy.  In both eyes.  This started months ago.  Severity is moderate.  Duration of 2 weeks.  Since onset it is stable.  I, the attending physician,  performed the HPI with the patient and updated documentation appropriately.          Comments    73 y/o female pt here for 2 wk f/u for PDR OU.  No change in VA OU.  Denies pain, flashes, floaters.  Pt reports not using any gtts.  BS 119 this a.m.  A1C unknown.       Last edited by Renner Sebald, MD on 08/16/2019 11:22 PM. (History)      Referring physician: No referring provider defined for this encounter.  HISTORICAL INFORMATION:   Selected notes from the medical record:  Referred by Dr. Alan Hager for DM exam LEE: 02.04.20 (A. Hager) [BCVA: OD: 20/400 OS: 20/150] Ocular Hx-ptosis OU, pseudo OU, diabetic ret OU PMH-hx of stroke (1 month), cancer, DM    CURRENT MEDICATIONS: Current Outpatient Medications (Ophthalmic Drugs)  Medication Sig  . dorzolamide-timolol (COSOPT) 22.3-6.8 MG/ML ophthalmic solution Place 1 drop into both eyes 2 (two) times daily. (Patient not taking: Reported on 08/16/2019)   No current facility-administered medications for this visit.  (Ophthalmic Drugs)   Current Outpatient Medications (Other)  Medication Sig  . Accu-Chek FastClix Lancets MISC USE AS DIRECTED TO TEST BLOOD GLUCOSE DAILY  . amLODipine (NORVASC) 5 MG tablet Take by mouth.  . aspirin EC 81 MG tablet Take by mouth.  . atorvastatin (LIPITOR) 80 MG tablet Take by mouth.  . Blood Glucose Monitoring Suppl (FIFTY50 GLUCOSE METER 2.0) w/Device KIT Use to check blood sugars. Freestyle glucose meter. Okay to substitute for other meter preferred by insurance.  ICD-10 E11.9.  . carvedilol (COREG) 25 MG tablet Take by mouth.  . cefTRIAXone (ROCEPHIN) 1 g injection   . cephALEXin (KEFLEX) 500 MG capsule   . donepezil (ARICEPT) 10 MG tablet Take by mouth.  . enalapril (VASOTEC) 5 MG tablet Take 5 mg by mouth daily.  . ferrous sulfate 325 (65 FE) MG tablet Take by mouth.  . glucose blood ([employer] TEST STRIPS) test strip Use to check blood sugars 3 times daily. Freestyle test strips. Ok to substitute for other strips as preferred by insurance. ICD-10 E11.9.  . insulin lispro (HUMALOG) 100 UNIT/ML injection Inject into the skin.  . insulin NPH-regular Human (70-30) 100 UNIT/ML injection Inject into the skin.  . Insulin Pen Needle (FIFTY50 PEN NEEDLES) 32G X 4 MM MISC Needles for insulin pen. Inject as instructed E11.9  . KLOR-CON M20 20 MEQ tablet Take 20 mEq by mouth daily.  . lisinopril (PRINIVIL,ZESTRIL) 20 MG tablet Take 40 mg by mouth daily.  . loperamide (IMODIUM) 2 MG capsule Take by mouth.  . meclizine (ANTIVERT) 25 MG tablet Take 1 tablet (25 mg total) by mouth 3 (three) times daily as needed for dizziness.  . ondansetron (ZOFRAN) 4 MG tablet Take 4 mg by mouth every 6 (six) hours as needed.   Current Facility-Administered Medications (Other)  Medication Route  . triamcinolone acetonide (KENALOG-40) injection 4 mg Intravitreal  . triamcinolone acetonide (KENALOG-40)   injection 4 mg Intravitreal      REVIEW OF SYSTEMS: ROS    Positive for: Endocrine, Eyes   Negative for: Constitutional, Gastrointestinal, Neurological, Skin, Genitourinary, Musculoskeletal, HENT, Cardiovascular, Respiratory, Psychiatric, Allergic/Imm, Heme/Lymph   Last edited by Baxley, Andrew G, COA on 08/16/2019  1:37 PM. (History)       ALLERGIES No Known Allergies  PAST MEDICAL HISTORY Past Medical History:  Diagnosis Date  . Cancer (HCC)   . CVA (cerebral vascular accident) (HCC)   . Diabetes mellitus without complication (HCC)   . Diabetic retinopathy (HCC)     PDR OU  . Hypertensive retinopathy    OU  . Stroke (HCC)    Past Surgical History:  Procedure Laterality Date  . CATARACT EXTRACTION    . EYE SURGERY    . MASTECTOMY Bilateral     FAMILY HISTORY History reviewed. No pertinent family history.  SOCIAL HISTORY Social History   Tobacco Use  . Smoking status: Former Smoker  . Smokeless tobacco: Never Used  Substance Use Topics  . Alcohol use: Not Currently  . Drug use: Never         OPHTHALMIC EXAM:  Base Eye Exam    Visual Acuity (Snellen - Linear)      Right Left   Dist [place] 20/60 -2 20/70 -2   Dist ph [place] 20/50 -2 NI       Tonometry (Tonopen, 1:40 PM)      Right Left   Pressure 19 18       Pupils      Dark Light Shape React APD   Right 2 1 Round Minimal None   Left 2 1 Round Minimal None       Visual Fields (Counting fingers)      Left Right    Full Full       Extraocular Movement      Right Left    Full, Ortho Full, Ortho       Neuro/Psych    Oriented x3: Yes   Mood/Affect: Normal       Dilation    Both eyes: 1.0% Mydriacyl, 2.5% Phenylephrine @ 1:40 PM        Slit Lamp and Fundus Exam    Slit Lamp Exam      Right Left   Lids/Lashes Dermatochalasis - upper lid, nasal Ectropion - lower lid Dermatochalasis - upper lid, Meibomian gland dysfunction, mild, nasal Ectropion, lower lid   Conjunctiva/Sclera nasal and temporal Pinguecula, Melanosis nasal and temporal Pinguecula, Melanosis   Cornea mild Arcus, Well healed cataract wounds, tear film debris mild Arcus, Well healed cataract wounds, 1+ Punctate epithelial erosions   Anterior Chamber deep and clear deep and clear   Iris Round and dilated to 4mm, No NVI Round and poorly dilated to 4mm, No NVI   Lens Posterior chamber intraocular lens, trace Posterior capsular opacification Posterior chamber intraocular lens, trace Posterior capsular opacification   Vitreous Vitreous syneresis, vast improvement in VH -- clearing centrally and settling  inferiorly--mild residual old heme at 06:00. Vitreous syneresis       Fundus Exam      Right Left   Disc mild Pallor, sharp rim, ?NVD, fibrosis superiorly and surrounding IRH Pink and sharp, fine NVD - regressing, sharp rim   C/D Ratio 0.1 0.2   Macula Blunted foveal reflex, +ERM, scattered exudates/MA - improved, +fibrosis Blunted foveal reflex, Epiretinal membrane, NVE, scattered IRH/exudates - improved, CWS IN to macula, pseudo hole/macular cyst   Vessels Vascular attenuation,   Tortuous, +fibrosis along ST arcades severe Vascular attenuation   Periphery Attached, scattered IRH/MA, good 360 PRP room for fill in  Attached, scattered IRH, 360 PRP w/ good posterior fill-in          IMAGING AND PROCEDURES  Imaging and Procedures for @TODAY@  OCT, Retina - OU - Both Eyes       Right Eye Quality was good. Central Foveal Thickness: 402. Progression has worsened. Findings include abnormal foveal contour, intraretinal fluid, intraretinal hyper-reflective material, no SRF, outer retinal atrophy, epiretinal membrane (Mild interval increase in IRF).   Left Eye Quality was good. Central Foveal Thickness: 340. Progression has been stable. Findings include abnormal foveal contour, intraretinal fluid, epiretinal membrane, no SRF, preretinal fibrosis, vitreous traction (persistent IRF with +VMT).   Notes *Images captured and stored on drive  Diagnosis / Impression:  DME OU Mild ERM OU OD: Mild interval increase in IRF OS: persistent IRF with +VMT   Clinical management:  See below  Abbreviations: NFP - Normal foveal profile. CME - cystoid macular edema. PED - pigment epithelial detachment. IRF - intraretinal fluid. SRF - subretinal fluid. EZ - ellipsoid zone. ERM - epiretinal membrane. ORA - outer retinal atrophy. ORT - outer retinal tubulation. SRHM - subretinal hyper-reflective material        Intravitreal Injection, Pharmacologic Agent - OD - Right Eye       Time Out 08/16/2019.  1:38 PM. Confirmed correct patient, procedure, site, and patient consented.   Anesthesia Topical anesthesia was used. Anesthetic medications included Lidocaine 2%, Proparacaine 0.5%.   Procedure Preparation included 5% betadine to ocular surface, eyelid speculum. A supplied needle was used.   Injection:  1.25 mg Bevacizumab (AVASTIN) SOLN   NDC: 50242-060-01, Lot: [id number], Expiration date: 09/27/2019   Route: Intravitreal, Site: Right Eye, Waste: 0 mL  Post-op Post injection exam found visual acuity of at least counting fingers. The patient tolerated the procedure well. There were no complications. The patient received written and verbal post procedure care education.        Intravitreal Injection, Pharmacologic Agent - OS - Left Eye       Time Out 08/16/2019. 1:39 PM. Confirmed correct patient, procedure, site, and patient consented.   Anesthesia Topical anesthesia was used. Anesthetic medications included Lidocaine 2%, Proparacaine 0.5%.   Procedure Preparation included 5% betadine to ocular surface, eyelid speculum. A supplied needle was used.   Injection:  1.25 mg Bevacizumab (AVASTIN) SOLN   NDC: 50242-060-01, Lot: [id number], Expiration date: 09/27/2019   Route: Intravitreal, Site: Left Eye, Waste: 0 mg                 ASSESSMENT/PLAN:    ICD-10-CM   1. Proliferative diabetic retinopathy of both eyes with macular edema associated with type 2 diabetes mellitus (HCC)  E11.3513 Intravitreal Injection, Pharmacologic Agent - OD - Right Eye    Intravitreal Injection, Pharmacologic Agent - OS - Left Eye    Bevacizumab (AVASTIN) SOLN 1.25 mg    Bevacizumab (AVASTIN) SOLN 1.25 mg  2. Vitreous hemorrhage of right eye (HCC)  H43.11   3. 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 Essential hypertension  I10   6. Hypertensive retinopathy of both eyes  H35.033   7. Pseudophakia of both eyes  Z96.1   8.  History of stroke  Z86.73   9. Ocular hypertension, bilateral  H40.053     1-3. Proliferative diabetic   retinopathy w/ DME OU  - lost to f/u 3.23.2020 to 7.13.2020 due to [place] restrictions  - VH OD onset 2.25.20 -- clearing centrally and settling inferiorly  - at initial presentation - difficult exam due to poor patient cooperation -- history of stroke -- improved now   - initial exam showed central edema OU and scattered IRH; +fine NVD OS; early flat NVE OD  - FA (08.19.20) shows NV nasal to disc OD; OS with NV along SN and ST arcades  - discussed findings, prognosis and treatment options for DME and proliferative diabetic retinopathy  - s/p PRP OS (02.18.20), fill-in (08.19.20)  - s/p PRP OD (03.23.30) -- light PRP scars in place  - s/p IVTA OU #1 (02.25.20), #2 (03.23.20)  - s/p IVA OU #1 (07.31.20)  - IOP 19,18 today  - cont Cosopt BID OU  - OCT shows interval increase in IRF OD, persistent IRF with VMT OS  - recommend IVA OU #2 today (09.02.20)  - pt wishes to proceed  - RBA of procedure discussed, questions answered  - informed consent obtained and signed  - see procedure note  - f/u 2 weeks, DFE, OCT, PRP OD  4. VMT OS  - stable VMT, contributing to central edema OS  - monitor  5,6. Hypertensive retinopathy OU  - discussed importance of tight BP control and its relation to 1,2 above  - monitor  7. Pseudophakia OU  - s/p CE/IOL OU  - doing well  - monitor  8. History of stroke  - ischemic stroke on 12/26/2018  - pt reports some visual decline following stroke  9. Ocular Hypertension OU  - ?steroid response  - IOP 19,18 on Cosopt BID OU -- continue  - monitor    Ophthalmic Meds Ordered this visit:  Meds ordered this encounter  Medications  . Bevacizumab (AVASTIN) SOLN 1.25 mg  . Bevacizumab (AVASTIN) SOLN 1.25 mg       Return in about 2 weeks (around 08/30/2019) for f/u PDR OU, DFE, OCT -- PRP fill in OD.  There are no Patient Instructions  on file for this visit.   Explained the diagnoses, plan, and follow up with the patient and they expressed understanding.  Patient expressed understanding of the importance of proper follow up care.   This document serves as a record of services personally performed by Gardiner Sleeper, MD, PhD. It was created on their behalf by Roselee Nova, COMT. The creation of this record is the provider's dictation and/or activities during the visit.  Electronically signed by: Roselee Nova, COMT 08/16/19 11:29 PM   This document serves as a record of services personally performed by Gardiner Sleeper, MD, PhD. It was created on their behalf by Ernest Mallick, OA, an ophthalmic assistant. The creation of this record is the provider's dictation and/or activities during the visit.    Electronically signed by: Ernest Mallick, OA  09.02.2020 11:29 PM    Gardiner Sleeper, M.D., Ph.D. Diseases & Surgery of the Retina and Vitreous [facility]  I have reviewed the above documentation for accuracy and completeness, and I agree with the above. Gardiner Sleeper, M.D., Ph.D. 08/16/19 11:29 PM    Abbreviations: M myopia (nearsighted); A astigmatism; H hyperopia (farsighted); P presbyopia; Mrx spectacle prescription;  CTL contact lenses; OD right eye; OS left eye; OU both eyes  XT exotropia; ET esotropia; PEK punctate epithelial keratitis; PEE punctate epithelial erosions; DES dry eye syndrome; MGD meibomian  gland dysfunction; ATs artificial tears; PFAT's preservative free artificial tears; NSC nuclear sclerotic cataract; PSC posterior subcapsular cataract; ERM epi-retinal membrane; PVD posterior vitreous detachment; RD retinal detachment; DM diabetes mellitus; DR diabetic retinopathy; NPDR non-proliferative diabetic retinopathy; PDR proliferative diabetic retinopathy; CSME clinically significant macular edema; DME diabetic macular edema; dbh dot blot hemorrhages; CWS cotton wool spot; POAG primary open  angle glaucoma; C/D cup-to-disc ratio; HVF humphrey visual field; GVF goldmann visual field; OCT optical coherence tomography; IOP intraocular pressure; BRVO Branch retinal vein occlusion; CRVO central retinal vein occlusion; CRAO central retinal artery occlusion; BRAO branch retinal artery occlusion; RT retinal tear; SB scleral buckle; PPV pars plana vitrectomy; VH Vitreous hemorrhage; PRP panretinal laser photocoagulation; IVK intravitreal kenalog; VMT vitreomacular traction; MH Macular hole;  NVD neovascularization of the disc; NVE neovascularization elsewhere; AREDS age related eye disease study; ARMD age related macular degeneration; POAG primary open angle glaucoma; EBMD epithelial/anterior basement membrane dystrophy; ACIOL anterior chamber intraocular lens; IOL intraocular lens; PCIOL posterior chamber intraocular lens; Phaco/IOL phacoemulsification with intraocular lens placement; PRK photorefractive keratectomy; LASIK laser assisted in situ keratomileusis; HTN hypertension; DM diabetes mellitus; COPD chronic obstructive pulmonary disease  

## 2019-08-16 ENCOUNTER — Ambulatory Visit (INDEPENDENT_AMBULATORY_CARE_PROVIDER_SITE_OTHER): Payer: Medicare Other | Admitting: Ophthalmology

## 2019-08-16 ENCOUNTER — Encounter (INDEPENDENT_AMBULATORY_CARE_PROVIDER_SITE_OTHER): Payer: Self-pay | Admitting: Ophthalmology

## 2019-08-16 ENCOUNTER — Other Ambulatory Visit: Payer: Self-pay

## 2019-08-16 DIAGNOSIS — H3581 Retinal edema: Secondary | ICD-10-CM

## 2019-08-16 DIAGNOSIS — H43822 Vitreomacular adhesion, left eye: Secondary | ICD-10-CM

## 2019-08-16 DIAGNOSIS — Z8673 Personal history of transient ischemic attack (TIA), and cerebral infarction without residual deficits: Secondary | ICD-10-CM

## 2019-08-16 DIAGNOSIS — E113513 Type 2 diabetes mellitus with proliferative diabetic retinopathy with macular edema, bilateral: Secondary | ICD-10-CM

## 2019-08-16 DIAGNOSIS — Z961 Presence of intraocular lens: Secondary | ICD-10-CM

## 2019-08-16 DIAGNOSIS — H40053 Ocular hypertension, bilateral: Secondary | ICD-10-CM

## 2019-08-16 DIAGNOSIS — I10 Essential (primary) hypertension: Secondary | ICD-10-CM

## 2019-08-16 DIAGNOSIS — H35033 Hypertensive retinopathy, bilateral: Secondary | ICD-10-CM

## 2019-08-16 DIAGNOSIS — H4311 Vitreous hemorrhage, right eye: Secondary | ICD-10-CM

## 2019-08-16 MED ORDER — BEVACIZUMAB CHEMO INJECTION 1.25MG/0.05ML SYRINGE FOR KALEIDOSCOPE
1.2500 mg | INTRAVITREAL | Status: AC | PRN
  Administered 2019-08-16: 1.25 mg via INTRAVITREAL

## 2019-08-30 ENCOUNTER — Ambulatory Visit (INDEPENDENT_AMBULATORY_CARE_PROVIDER_SITE_OTHER): Payer: Medicare Other | Admitting: Ophthalmology

## 2019-08-30 ENCOUNTER — Other Ambulatory Visit: Payer: Self-pay

## 2019-08-30 ENCOUNTER — Encounter (INDEPENDENT_AMBULATORY_CARE_PROVIDER_SITE_OTHER): Payer: Self-pay | Admitting: Ophthalmology

## 2019-08-30 DIAGNOSIS — Z8673 Personal history of transient ischemic attack (TIA), and cerebral infarction without residual deficits: Secondary | ICD-10-CM

## 2019-08-30 DIAGNOSIS — H43822 Vitreomacular adhesion, left eye: Secondary | ICD-10-CM

## 2019-08-30 DIAGNOSIS — H3581 Retinal edema: Secondary | ICD-10-CM

## 2019-08-30 DIAGNOSIS — H4311 Vitreous hemorrhage, right eye: Secondary | ICD-10-CM | POA: Diagnosis not present

## 2019-08-30 DIAGNOSIS — I10 Essential (primary) hypertension: Secondary | ICD-10-CM

## 2019-08-30 DIAGNOSIS — E113513 Type 2 diabetes mellitus with proliferative diabetic retinopathy with macular edema, bilateral: Secondary | ICD-10-CM | POA: Diagnosis not present

## 2019-08-30 DIAGNOSIS — Z961 Presence of intraocular lens: Secondary | ICD-10-CM

## 2019-08-30 DIAGNOSIS — H40053 Ocular hypertension, bilateral: Secondary | ICD-10-CM

## 2019-08-30 DIAGNOSIS — H35033 Hypertensive retinopathy, bilateral: Secondary | ICD-10-CM

## 2019-08-30 MED ORDER — BRIMONIDINE TARTRATE 0.2 % OP SOLN: 1.0000 [drp] | Freq: Two times a day (BID) | OPHTHALMIC | 6 refills | Status: AC

## 2019-08-30 NOTE — Progress Notes (Signed)
[facility] Note  08/30/2019     CHIEF COMPLAINT Patient presents for Retina Follow Up   HISTORY OF PRESENT ILLNESS: Desiree Pollard is a 73 y.o. female who presents to the clinic today for:   HPI    Retina Follow Up    Patient presents with  Diabetic Retinopathy.  In both eyes.  This started weeks ago.  Severity is moderate.  Duration of weeks.  Since onset it is stable.  I, the attending physician,  performed the HPI with the patient and updated documentation appropriately.          Comments    Patient states her vision is about the same.  She denies eye pain or discomfort and denies any new or worsening floaters or fol OU.  BS: 119 this AM       Last edited by Bernarda Caffey, MD on 08/30/2019  3:09 PM. (History)    pt states she cannot whether her vision has improved since receiving the injections at last visit, she states she was told last time she was here to stop using Cosopt  Referring physician: No referring provider defined for this encounter.  HISTORICAL INFORMATION:   Selected notes from the MEDICAL RECORD NUMBER Referred by Dr. Angelena Form for DM exam LEE: 02.04.20 (A. [place]) [BCVA: OD: 20/400 OS: 20/150] Ocular Hx-ptosis OU, pseudo OU, diabetic ret OU PMH-hx of stroke (1 month), cancer, DM    CURRENT MEDICATIONS: Current Outpatient Medications (Ophthalmic Drugs)  Medication Sig  . brimonidine (ALPHAGAN) 0.2 % ophthalmic solution Place 1 drop into both eyes 2 (two) times daily.  . dorzolamide-timolol (COSOPT) 22.3-6.8 MG/ML ophthalmic solution Place 1 drop into both eyes 2 (two) times daily. (Patient not taking: Reported on 08/16/2019)   No current facility-administered medications for this visit.  (Ophthalmic Drugs)   Current Outpatient Medications (Other)  Medication Sig  . Accu-Chek FastClix Lancets MISC USE AS DIRECTED TO TEST BLOOD GLUCOSE DAILY  . amLODipine (NORVASC) 5 MG tablet Take by mouth.  Marland Kitchen aspirin EC 81 MG tablet Take  by mouth.  Marland Kitchen atorvastatin (LIPITOR) 80 MG tablet Take by mouth.  . Blood Glucose Monitoring Suppl (FIFTY50 GLUCOSE METER 2.0) w/Device KIT Use to check blood sugars. Freestyle glucose meter. Okay to substitute for other meter preferred by insurance. ICD-10 E11.9.  Marland Kitchen carvedilol (COREG) 25 MG tablet Take by mouth.  . cefTRIAXone (ROCEPHIN) 1 g injection   . cephALEXin (KEFLEX) 500 MG capsule   . donepezil (ARICEPT) 10 MG tablet Take by mouth.  . enalapril (VASOTEC) 5 MG tablet Take 5 mg by mouth daily.  . ferrous sulfate 325 (65 FE) MG tablet Take by mouth.  Marland Kitchen glucose blood ([employer] TEST STRIPS) test strip Use to check blood sugars 3 times daily. Freestyle test strips. Ok to substitute for other strips as preferred by insurance. ICD-10 E11.9.  . insulin lispro (HUMALOG) 100 UNIT/ML injection Inject into the skin.  Marland Kitchen insulin NPH-regular Human (70-30) 100 UNIT/ML injection Inject into the skin.  . Insulin Pen Needle (FIFTY50 PEN NEEDLES) 32G X 4 MM MISC Needles for insulin pen. Inject as instructed E11.9  . KLOR-CON M20 20 MEQ tablet Take 20 mEq by mouth daily.  Marland Kitchen lisinopril (PRINIVIL,ZESTRIL) 20 MG tablet Take 40 mg by mouth daily.  Marland Kitchen loperamide (IMODIUM) 2 MG capsule Take by mouth.  . meclizine (ANTIVERT) 25 MG tablet Take 1 tablet (25 mg total) by mouth 3 (three) times daily as needed for dizziness.  Marland Kitchen  ondansetron (ZOFRAN) 4 MG tablet Take 4 mg by mouth every 6 (six) hours as needed.   Current Facility-Administered Medications (Other)  Medication Route  . triamcinolone acetonide (KENALOG-40) injection 4 mg Intravitreal  . triamcinolone acetonide (KENALOG-40) injection 4 mg Intravitreal      REVIEW OF SYSTEMS: ROS    Positive for: Endocrine, Eyes   Negative for: Constitutional, Gastrointestinal, Neurological, Skin, Genitourinary, Musculoskeletal, HENT, Cardiovascular, Respiratory, Psychiatric, Allergic/Imm, Heme/Lymph   Last edited by Doneen Poisson on 08/30/2019  2:16 PM. (History)        ALLERGIES No Known Allergies  PAST MEDICAL HISTORY Past Medical History:  Diagnosis Date  . Cancer ([place])   . CVA (cerebral vascular accident) ([place])   . Diabetes mellitus without complication ([place])   . Diabetic retinopathy ([employer])    PDR OU  . Hypertensive retinopathy    OU  . Stroke [facility])    Past Surgical History:  Procedure Laterality Date  . CATARACT EXTRACTION    . EYE SURGERY    . MASTECTOMY Bilateral     FAMILY HISTORY History reviewed. No pertinent family history.  SOCIAL HISTORY Social History   Tobacco Use  . Smoking status: Former Research scientist (life sciences)  . Smokeless tobacco: Never Used  Substance Use Topics  . Alcohol use: Not Currently  . Drug use: Never         OPHTHALMIC EXAM:  Base Eye Exam    Visual Acuity (Snellen - Linear)      Right Left   Dist [place] 20/50 -2 20/50 -2   Dist ph [place] NI 20/40 -2       Tonometry (Tonopen, 2:20 PM)      Right Left   Pressure 32 33       Tonometry #2 (Tonopen, 3:41 PM)      Right Left   Pressure 17 17       Tonometry Comments   1 gtt of cosopt given OU @ 2:24 1 gtt of brimonidine given OU @ 2:25       Pupils      Dark Light Shape React APD   Right 2 1 Round Minimal 0   Left 2 1 Round Minimal 0       Visual Fields      Left Right    Full Full       Extraocular Movement      Right Left    Full, Ortho Full, Ortho       Neuro/Psych    Oriented x3: Yes   Mood/Affect: Normal       Dilation    Both eyes: 1.0% Mydriacyl, 2.5% Phenylephrine @ 2:20 PM        Slit Lamp and Fundus Exam    Slit Lamp Exam      Right Left   Lids/Lashes Dermatochalasis - upper lid, nasal Ectropion - lower lid Dermatochalasis - upper lid, Meibomian gland dysfunction, mild, nasal Ectropion, lower lid   Conjunctiva/Sclera nasal and temporal Pinguecula, Melanosis nasal and temporal Pinguecula, Melanosis   Cornea mild Arcus, Well healed cataract wounds, tear film debris mild Arcus, Well healed cataract wounds, 1+ Punctate  epithelial erosions   Anterior Chamber deep and clear deep and clear   Iris Round and dilated to 59m, No NVI Round and poorly dilated to 433m No NVI   Lens Posterior chamber intraocular lens, trace Posterior capsular opacification Posterior chamber intraocular lens, trace Posterior capsular opacification   Vitreous Vitreous syneresis, vast improvement in VH -- clearing centrally and settling  inferiorly--mild residual old heme at 06:00. Vitreous syneresis       Fundus Exam      Right Left   Disc mild Pallor, sharp rim, ?NVD, fibrosis superiorly and surrounding IRH Pink and sharp, fine NVD - regressing, sharp rim   C/D Ratio 0.1 0.2   Macula Blunted foveal reflex, +ERM, scattered exudates/MA - improved, +fibrosis Blunted foveal reflex, Epiretinal membrane, NVE, scattered IRH/exudates - improved, CWS IN to macula, pseudo hole/macular cyst   Vessels Vascular attenuation, Tortuous, +fibrosis along ST arcades severe Vascular attenuation, Tortuous   Periphery Attached, scattered IRH/MA, good 360 PRP room for fill in  Attached, scattered IRH, 360 PRP w/ good posterior fill-in          IMAGING AND PROCEDURES  Imaging and Procedures for @TODAY @  OCT, Retina - OU - Both Eyes       Right Eye Quality was good. Central Foveal Thickness: 385. Progression has improved. Findings include abnormal foveal contour, intraretinal fluid, intraretinal hyper-reflective material, no SRF, outer retinal atrophy, epiretinal membrane (Mild interval improvement in IRF).   Left Eye Quality was good. Central Foveal Thickness: 332. Progression has improved. Findings include abnormal foveal contour, intraretinal fluid, epiretinal membrane, no SRF, preretinal fibrosis, vitreous traction (Mild interval improvement in IRF with +VMT).   Notes *Images captured and stored on drive  Diagnosis / Impression:  DME OU Mild ERM OU OD: Mild interval improvement in IRF OS: Mild interval improvement in IRF with  +VMT   Clinical management:  See below  Abbreviations: NFP - Normal foveal profile. CME - cystoid macular edema. PED - pigment epithelial detachment. IRF - intraretinal fluid. SRF - subretinal fluid. EZ - ellipsoid zone. ERM - epiretinal membrane. ORA - outer retinal atrophy. ORT - outer retinal tubulation. SRHM - subretinal hyper-reflective material        Panretinal Photocoagulation - OD - Right Eye       LASER PROCEDURE NOTE  Diagnosis:   Proliferative Diabetic Retinopathy, RIGHT EYE  Procedure:  Pan-retinal photocoagulation using slit lamp laser, RIGHT EYE  Anesthesia:  Topical  Surgeon: Bernarda Caffey, MD, PhD   Informed consent obtained, operative eye marked, and time out performed prior to initiation of laser.   Lumenis Veria slit lamp laser Power: 230 mW Duration: 30 msec  Spot size: 200 microns  # spots: 081 spots   Complications: None.  Notes: old vitreous heme obscuring view and preventing laser up take inferiorly  RTC: 2 wks  Patient tolerated the procedure well and received written and verbal post-procedure care information/education.                  ASSESSMENT/PLAN:    ICD-10-CM   1. Proliferative diabetic retinopathy of both eyes with macular edema associated with type 2 diabetes mellitus ([place])  K48.1856 Panretinal Photocoagulation - OD - Right Eye  2. Vitreous hemorrhage of right eye (HCC)  H43.11 Panretinal Photocoagulation - OD - Right Eye  3. 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 Essential hypertension  I10   6. Hypertensive retinopathy of both eyes  H35.033   7. Pseudophakia of both eyes  Z96.1   8. History of stroke  Z86.73   9. Ocular hypertension, bilateral  H40.053     1-3. Proliferative diabetic retinopathy w/ DME OU  - lost to f/u 3.23.2020 to 7.13.2020 due to [place] restrictions  - VH OD onset 2.25.20 -- clearing  centrally and settling  inferiorly  - at initial presentation - difficult exam due to poor patient cooperation -- history of stroke -- improved now   - initial exam showed central edema OU and scattered IRH; +fine NVD OS; early flat NVE OD  - FA (08.19.20) shows NV nasal to disc OD; OS with NV along SN and ST arcades  - discussed findings, prognosis and treatment options for DME and proliferative diabetic retinopathy  - s/p PRP OS (02.18.20), fill-in (08.19.20)  - s/p PRP OD (03.23.30) -- light PRP scars in place  - s/p IVTA OU #1 (02.25.20), #2 (03.23.20)  - s/p IVA OU #1 (07.31.20), #2 (09.02.20(  - IOP 32,33 today (17 OU after 1 drop of brimonidine and 1 drop of cosopt) -- pt had erroneously d/c'd Cosopt  - cont Cosopt BID OU  - add brimonidine BID OU  - OCT shows interval improvement in IRF OD, interval improvement in IRF with VMT OS  - recommend PRP OD fill in today, 09.16.20  - pt wishes to proceed  - RBA of procedure discussed, questions answered  - informed consent obtained and signed  - see procedure note  - start Lotemax qid OD x7 days  - f/u 2-3 weeks, DFE, OCT, possible injections OU  4. VMT OS  - stable VMT, contributing to central edema OS  - monitor  5,6. Hypertensive retinopathy OU  - discussed importance of tight BP control and its relation to 1,2 above  - monitor  7. Pseudophakia OU  - s/p CE/IOL OU  - doing well  - monitor  8. History of stroke  - ischemic stroke on 12/26/2018  - pt reports some visual decline following stroke  9. Ocular Hypertension OU  - ?steroid response  - IOP elevated today -- pt had not been taking Cosopt  - Cosopt BID OU -- continue  - add brimonidine BID OU  - monitor    Ophthalmic Meds Ordered this visit:  Meds ordered this encounter  Medications  . brimonidine (ALPHAGAN) 0.2 % ophthalmic solution    Sig: Place 1 drop into both eyes 2 (two) times daily.    Dispense:  10 mL    Refill:  6       Return in about 2 weeks (around 09/13/2019) for  f/u PDR w/ DME OU -- Dilated Exam, OCT, Possible Injxn.  There are no Patient Instructions on file for this visit.   Explained the diagnoses, plan, and follow up with the patient and they expressed understanding.  Patient expressed understanding of the importance of proper follow up care.   This document serves as a record of services personally performed by Gardiner Sleeper, MD, PhD. It was created on their behalf by Ernest Mallick, OA, an ophthalmic assistant. The creation of this record is the provider's dictation and/or activities during the visit.    Electronically signed by: Ernest Mallick, OA 09.16.2020 5:17 PM     Gardiner Sleeper, M.D., Ph.D. Diseases & Surgery of the Retina and Vitreous [facility]  I have reviewed the above documentation for accuracy and completeness, and I agree with the above. Gardiner Sleeper, M.D., Ph.D. 08/30/19 5:17 PM     Abbreviations: M myopia (nearsighted); A astigmatism; H hyperopia (farsighted); P presbyopia; Mrx spectacle prescription;  CTL contact lenses; OD right eye; OS left eye; OU both eyes  XT exotropia; ET esotropia; PEK punctate epithelial keratitis; PEE punctate epithelial erosions; DES dry eye syndrome; MGD meibomian gland  dysfunction; ATs artificial tears; PFAT's preservative free artificial tears; [place] nuclear sclerotic cataract; PSC posterior subcapsular cataract; ERM epi-retinal membrane; PVD posterior vitreous detachment; RD retinal detachment; DM diabetes mellitus; DR diabetic retinopathy; NPDR non-proliferative diabetic retinopathy; PDR proliferative diabetic retinopathy; CSME clinically significant macular edema; DME diabetic macular edema; dbh dot blot hemorrhages; CWS cotton wool spot; POAG primary open angle glaucoma; C/D cup-to-disc ratio; HVF humphrey visual field; GVF goldmann visual field; OCT optical coherence tomography; IOP intraocular pressure; BRVO Branch retinal vein occlusion; CRVO central retinal vein  occlusion; CRAO central retinal artery occlusion; BRAO branch retinal artery occlusion; RT retinal tear; SB scleral buckle; PPV pars plana vitrectomy; VH Vitreous hemorrhage; PRP panretinal laser photocoagulation; IVK intravitreal kenalog; VMT vitreomacular traction; MH Macular hole;  NVD neovascularization of the disc; NVE neovascularization elsewhere; AREDS age related eye disease study; ARMD age related macular degeneration; POAG primary open angle glaucoma; EBMD epithelial/anterior basement membrane dystrophy; ACIOL anterior chamber intraocular lens; IOL intraocular lens; PCIOL posterior chamber intraocular lens; Phaco/IOL phacoemulsification with intraocular lens placement; [place] photorefractive keratectomy; LASIK laser assisted in situ keratomileusis; HTN hypertension; DM diabetes mellitus; COPD chronic obstructive pulmonary disease

## 2019-09-15 NOTE — Progress Notes (Signed)
[facility] Note  09/20/2019     CHIEF COMPLAINT Patient presents for Retina Follow Up   HISTORY OF PRESENT ILLNESS: Desiree Pollard is a 73 y.o. female who presents to the clinic today for:   HPI    Retina Follow Up    Patient presents with  Diabetic Retinopathy.  In both eyes.  This started 2 weeks ago.  Since onset it is stable.  I, the attending physician,  performed the HPI with the patient and updated documentation appropriately.          Comments    F/U Laser retinopexy OD 08/30/19. Patient states she occasionally has floaters ou, denies ocular pain. BS 117(09/19/19) <denies hypo/hyperglucemic episodes. Pt ready for tx today if indicated.        Last edited by Bernarda Caffey, MD on 09/20/2019  4:32 PM. (History)    pt states she doesn't feel like her vision is any different since last visit, she states she used Lotemax QID for 7 days after laser procedure at last visit and she is using Cosopt and brimonidine BID OU   Referring physician: Phylliss Blakes, OD [address] [place],  [place] [address]  HISTORICAL INFORMATION:   Selected notes from the MEDICAL RECORD NUMBER Referred by Dr. Angelena Form for DM exam LEE: 02.04.20 (A. Hager) [BCVA: OD: 20/400 OS: 20/150] Ocular Hx-ptosis OU, pseudo OU, diabetic ret OU PMH-hx of stroke (1 month), cancer, DM    CURRENT MEDICATIONS: Current Outpatient Medications (Ophthalmic Drugs)  Medication Sig  . brimonidine (ALPHAGAN) 0.2 % ophthalmic solution Place 1 drop into both eyes 2 (two) times daily.  . dorzolamide-timolol (COSOPT) 22.3-6.8 MG/ML ophthalmic solution Place 1 drop into both eyes 2 (two) times daily.   No current facility-administered medications for this visit.  (Ophthalmic Drugs)   Current Outpatient Medications (Other)  Medication Sig  . Accu-Chek FastClix Lancets MISC USE AS DIRECTED TO TEST BLOOD GLUCOSE DAILY  . amLODipine (NORVASC) 5 MG tablet Take by mouth.  Marland Kitchen aspirin EC 81 MG tablet  Take by mouth.  Marland Kitchen atorvastatin (LIPITOR) 80 MG tablet Take by mouth.  . Blood Glucose Monitoring Suppl (FIFTY50 GLUCOSE METER 2.0) w/Device KIT Use to check blood sugars. Freestyle glucose meter. Okay to substitute for other meter preferred by insurance. ICD-10 E11.9.  Marland Kitchen carvedilol (COREG) 25 MG tablet Take by mouth.  . cefTRIAXone (ROCEPHIN) 1 g injection   . cephALEXin (KEFLEX) 500 MG capsule   . donepezil (ARICEPT) 10 MG tablet Take by mouth.  . enalapril (VASOTEC) 5 MG tablet Take 5 mg by mouth daily.  . ferrous sulfate 325 (65 FE) MG tablet Take by mouth.  . insulin lispro (HUMALOG) 100 UNIT/ML injection Inject into the skin.  Marland Kitchen insulin NPH-regular Human (70-30) 100 UNIT/ML injection Inject into the skin.  . Insulin Pen Needle (FIFTY50 PEN NEEDLES) 32G X 4 MM MISC Needles for insulin pen. Inject as instructed E11.9  . KLOR-CON M20 20 MEQ tablet Take 20 mEq by mouth daily.  Marland Kitchen lisinopril (PRINIVIL,ZESTRIL) 20 MG tablet Take 40 mg by mouth daily.  Marland Kitchen loperamide (IMODIUM) 2 MG capsule Take by mouth.  . meclizine (ANTIVERT) 25 MG tablet Take 1 tablet (25 mg total) by mouth 3 (three) times daily as needed for dizziness.  . ondansetron (ZOFRAN) 4 MG tablet Take 4 mg by mouth every 6 (six) hours as needed.   Current Facility-Administered Medications (Other)  Medication Route  . triamcinolone acetonide (KENALOG-40) injection 4  mg Intravitreal  . triamcinolone acetonide (KENALOG-40) injection 4 mg Intravitreal      REVIEW OF SYSTEMS: ROS    Positive for: Musculoskeletal, Endocrine, Cardiovascular, Eyes   Negative for: Constitutional, Gastrointestinal, Neurological, Skin, Genitourinary, HENT, Respiratory, Psychiatric, Allergic/Imm, Heme/Lymph   Last edited by Zenovia Jordan, LPN on 80/02/2121  4:82 PM. (History)       ALLERGIES No Known Allergies  PAST MEDICAL HISTORY Past Medical History:  Diagnosis Date  . Cancer ([place])   . CVA (cerebral vascular accident) ([place])   . Diabetes  mellitus without complication ([place])   . Diabetic retinopathy ([place])    PDR OU  . Hypertensive retinopathy    OU  . Stroke [facility])    Past Surgical History:  Procedure Laterality Date  . CATARACT EXTRACTION    . EYE SURGERY    . MASTECTOMY Bilateral     FAMILY HISTORY History reviewed. No pertinent family history.  SOCIAL HISTORY Social History   Tobacco Use  . Smoking status: Former Research scientist (life sciences)  . Smokeless tobacco: Never Used  Substance Use Topics  . Alcohol use: Not Currently  . Drug use: Never         OPHTHALMIC EXAM:  Base Eye Exam    Visual Acuity (Snellen - Linear)      Right Left   Dist [place] 20/80 20/70 -1   Dist ph [place] 20/70 -2 NI       Tonometry (Tonopen, 3:09 PM)      Right Left   Pressure 20 19       Pupils      Dark Light Shape React APD   Right 4 3 Round Slow None   Left 4 3 Round Slow None       Visual Fields (Counting fingers)      Left Right    Full Full       Extraocular Movement      Right Left    Full, Ortho Full, Ortho       Neuro/Psych    Oriented x3: Yes   Mood/Affect: Normal       Dilation    Both eyes: 1.0% Mydriacyl, 2.5% Phenylephrine @ 3:09 PM        Slit Lamp and Fundus Exam    Slit Lamp Exam      Right Left   Lids/Lashes Dermatochalasis - upper lid, nasal Ectropion - lower lid Dermatochalasis - upper lid, Meibomian gland dysfunction, mild, nasal Ectropion, lower lid   Conjunctiva/Sclera nasal and temporal Pinguecula, Melanosis nasal and temporal Pinguecula, Melanosis   Cornea mild Arcus, Well healed cataract wounds, tear film debris mild Arcus, Well healed cataract wounds, 1+ Punctate epithelial erosions   Anterior Chamber deep and clear deep and clear   Iris Round and reactive Round and poorly dilated to 28m, No NVI   Lens Posterior chamber intraocular lens, trace Posterior capsular opacification Posterior chamber intraocular lens, trace Posterior capsular opacification   Vitreous Vitreous syneresis, vast improvement  in VH -- clearing centrally and settling inferiorly--mild residual old heme at 06:00. Vitreous syneresis       Fundus Exam      Right Left   Disc mild Pallor, sharp rim, fibrosis superiorly and surrounding IRH Pink and sharp, fine NVD - regressing, sharp rim   C/D Ratio 0.1 0.2   Macula Blunted foveal reflex, +ERM, scattered exudates/MA - improved, +fibrosis Blunted foveal reflex, Epiretinal membrane, NVE, scattered IRH/exudates - improved, CWS IN to macula, pseudo hole/macular cyst   Vessels Vascular attenuation,  Tortuous, +fibrosis along ST arcades severe Vascular attenuation, Tortuous   Periphery Attached, scattered IRH/MA, good 360 PRP fill in Attached, scattered IRH, 360 PRP w/ good posterior fill-in          IMAGING AND PROCEDURES  Imaging and Procedures for @TODAY @  OCT, Retina - OU - Both Eyes       Right Eye Quality was good. Central Foveal Thickness: 436. Progression has worsened. Findings include abnormal foveal contour, intraretinal fluid, intraretinal hyper-reflective material, no SRF, outer retinal atrophy, epiretinal membrane (Mild interval increase in IRF).   Left Eye Quality was good. Central Foveal Thickness: 330. Progression has been stable. Findings include abnormal foveal contour, intraretinal fluid, epiretinal membrane, no SRF, preretinal fibrosis, vitreous traction (Persistent IRF with +VMT).   Notes *Images captured and stored on drive  Diagnosis / Impression:  DME OU Mild ERM OU OD: Mild interval increase in IRF OS: persistent IRF with +VMT   Clinical management:  See below  Abbreviations: NFP - Normal foveal profile. CME - cystoid macular edema. PED - pigment epithelial detachment. IRF - intraretinal fluid. SRF - subretinal fluid. EZ - ellipsoid zone. ERM - epiretinal membrane. ORA - outer retinal atrophy. ORT - outer retinal tubulation. SRHM - subretinal hyper-reflective material        Intravitreal Injection, Pharmacologic Agent - OD - Right  Eye       Time Out 09/20/2019. 4:05 PM. Confirmed correct patient, procedure, site, and patient consented.   Anesthesia Topical anesthesia was used. Anesthetic medications included Lidocaine 2%, Proparacaine 0.5%.   Procedure Preparation included 5% betadine to ocular surface, eyelid speculum. A 30 gauge needle was used.   Injection:  1.25 mg Bevacizumab (AVASTIN) SOLN   NDC: 03546-568-12, Lot: [id number] , Expiration date: 11/30/2019   Route: Intravitreal, Site: Right Eye, Waste: 0 mL  Post-op Post injection exam found visual acuity of at least counting fingers. The patient tolerated the procedure well. There were no complications. The patient received written and verbal post procedure care education.        Intravitreal Injection, Pharmacologic Agent - OS - Left Eye       Time Out 09/20/2019. 4:06 PM. Confirmed correct patient, procedure, site, and patient consented.   Anesthesia Topical anesthesia was used. Anesthetic medications included Lidocaine 2%, Proparacaine 0.5%.   Procedure Preparation included 5% betadine to ocular surface, eyelid speculum. A 30 gauge needle was used.   Injection:  1.25 mg Bevacizumab (AVASTIN) SOLN   NDC: 23/06/2019, Lot: [id number] , Expiration date: 11/01/2019   Route: Intravitreal, Site: Left Eye, Waste: 0 mL  Post-op Post injection exam found visual acuity of at least counting fingers. The patient tolerated the procedure well. There were no complications. The patient received written and verbal post procedure care education.                 ASSESSMENT/PLAN:    ICD-10-CM   1. Proliferative diabetic retinopathy of both eyes with macular edema associated with type 2 diabetes mellitus (HCC)  [id number] Intravitreal Injection, Pharmacologic Agent - OD - Right Eye    Intravitreal Injection, Pharmacologic Agent - OS - Left Eye    Bevacizumab (AVASTIN) SOLN 1.25 mg    Bevacizumab (AVASTIN) SOLN 1.25 mg  2. Vitreous hemorrhage of  right eye ([place])  H43.11   3. 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 Essential hypertension  I10   6. Hypertensive retinopathy of both eyes  H35.033   7. Pseudophakia of both eyes  Z96.1   8. History of stroke  Z86.73   9. Ocular hypertension, bilateral  H40.053     1-3. Proliferative diabetic retinopathy w/ DME OU  - lost to f/u 3.23.2020 to 7.13.2020 due to [place] restrictions  - VH OD onset 2.25.20 -- clearing centrally and settling inferiorly  - at initial presentation - difficult exam due to poor patient cooperation -- history of stroke -- improved now   - initial exam showed central edema OU and scattered IRH; +fine NVD OS; early flat NVE OD  - FA (08.19.20) shows NV nasal to disc OD; OS with NV along SN and ST arcades  - discussed findings, prognosis and treatment options for DME and proliferative diabetic retinopathy  - s/p PRP OS (02.18.20), fill-in (08.19.20)  - s/p PRP OD (03.23.30), fill-in (09.16.20)  - s/p IVTA OU #1 (02.25.20), #2 (03.23.20)  - s/p IVA OU #1 (07.31.20), #2 (09.02.20) -- 5 wks since last injections  - IOP 20,19 today  - cont Cosopt BID OU  - cont brimonidine BID OU  - OCT shows interval increase in IRF OD, persistent IRF with VMT OS  - recommend IVA OU #3 today, 10.07.2020 -- w/ dec return interval to 4 wks  - pt wishes to proceed  - RBA of procedure discussed, questions answered  - informed consent obtained and signed  - see procedure note  - completed Lotemax qid OD x7 days  - f/u 4 weeks, DFE, OCT, possible injections OU  4. VMT OS  - stable VMT, contributing to central edema OS  - monitor  5,6. Hypertensive retinopathy OU  - discussed importance of tight BP control and its relation to 1,2 above  - monitor  7. Pseudophakia OU  - s/p CE/IOL OU  - doing well  - monitor  8. History of stroke  - ischemic stroke on 12/26/2018  - pt reports some visual decline  following stroke  9. Ocular Hypertension OU  - ?steroid response  - IOP okay today on Cosopt and brimonidine  - cont Cosopt BID OU  - cont brimonidine BID OU  - monitor    Ophthalmic Meds Ordered this visit:  Meds ordered this encounter  Medications  . Bevacizumab (AVASTIN) SOLN 1.25 mg  . Bevacizumab (AVASTIN) SOLN 1.25 mg       Return in about 4 weeks (around 10/18/2019) for f/u PDR OU, DFE, OCT.  There are no Patient Instructions on file for this visit.   Explained the diagnoses, plan, and follow up with the patient and they expressed understanding.  Patient expressed understanding of the importance of proper follow up care.   This document serves as a record of services personally performed by Gardiner Sleeper, MD, PhD. It was created on their behalf by Roselee Nova, COMT. The creation of this record is the provider's dictation and/or activities during the visit.  Electronically signed by: Roselee Nova, COMT 09/20/19 4:35 PM   Gardiner Sleeper, M.D., Ph.D. Diseases & Surgery of the Retina and [place] 09/20/19  I have reviewed the above documentation for accuracy and completeness, and I agree with the above. Gardiner Sleeper, M.D., Ph.D. 09/20/19 4:35 PM   Abbreviations: M myopia (nearsighted); A astigmatism; H hyperopia (farsighted); P presbyopia; Mrx spectacle prescription;  CTL contact lenses; OD right eye; OS left eye; OU both eyes  XT exotropia; ET esotropia; PEK punctate epithelial keratitis; PEE punctate epithelial  erosions; DES dry eye syndrome; MGD meibomian gland dysfunction; ATs artificial tears; PFAT's preservative free artificial tears; [place] nuclear sclerotic cataract; PSC posterior subcapsular cataract; ERM epi-retinal membrane; PVD posterior vitreous detachment; RD retinal detachment; DM diabetes mellitus; DR diabetic retinopathy; NPDR non-proliferative diabetic retinopathy; PDR proliferative diabetic retinopathy; CSME  clinically significant macular edema; DME diabetic macular edema; dbh dot blot hemorrhages; CWS cotton wool spot; POAG primary open angle glaucoma; C/D cup-to-disc ratio; HVF humphrey visual field; GVF goldmann visual field; OCT optical coherence tomography; IOP intraocular pressure; BRVO Branch retinal vein occlusion; CRVO central retinal vein occlusion; CRAO central retinal artery occlusion; BRAO branch retinal artery occlusion; RT retinal tear; SB scleral buckle; PPV pars plana vitrectomy; VH Vitreous hemorrhage; PRP panretinal laser photocoagulation; IVK intravitreal kenalog; VMT vitreomacular traction; MH Macular hole;  NVD neovascularization of the disc; NVE neovascularization elsewhere; AREDS age related eye disease study; ARMD age related macular degeneration; POAG primary open angle glaucoma; EBMD epithelial/anterior basement membrane dystrophy; ACIOL anterior chamber intraocular lens; IOL intraocular lens; PCIOL posterior chamber intraocular lens; Phaco/IOL phacoemulsification with intraocular lens placement; [place] photorefractive keratectomy; LASIK laser assisted in situ keratomileusis; HTN hypertension; DM diabetes mellitus; COPD chronic obstructive pulmonary disease

## 2019-09-20 ENCOUNTER — Encounter (INDEPENDENT_AMBULATORY_CARE_PROVIDER_SITE_OTHER): Payer: Self-pay | Admitting: Ophthalmology

## 2019-09-20 ENCOUNTER — Other Ambulatory Visit: Payer: Self-pay

## 2019-09-20 ENCOUNTER — Ambulatory Visit (INDEPENDENT_AMBULATORY_CARE_PROVIDER_SITE_OTHER): Payer: Medicare Other | Admitting: Ophthalmology

## 2019-09-20 DIAGNOSIS — E113513 Type 2 diabetes mellitus with proliferative diabetic retinopathy with macular edema, bilateral: Secondary | ICD-10-CM | POA: Diagnosis not present

## 2019-09-20 DIAGNOSIS — H40053 Ocular hypertension, bilateral: Secondary | ICD-10-CM

## 2019-09-20 DIAGNOSIS — H43822 Vitreomacular adhesion, left eye: Secondary | ICD-10-CM | POA: Diagnosis not present

## 2019-09-20 DIAGNOSIS — Z8673 Personal history of transient ischemic attack (TIA), and cerebral infarction without residual deficits: Secondary | ICD-10-CM

## 2019-09-20 DIAGNOSIS — H35033 Hypertensive retinopathy, bilateral: Secondary | ICD-10-CM

## 2019-09-20 DIAGNOSIS — H4311 Vitreous hemorrhage, right eye: Secondary | ICD-10-CM

## 2019-09-20 DIAGNOSIS — H3581 Retinal edema: Secondary | ICD-10-CM | POA: Diagnosis not present

## 2019-09-20 DIAGNOSIS — Z961 Presence of intraocular lens: Secondary | ICD-10-CM

## 2019-09-20 DIAGNOSIS — I10 Essential (primary) hypertension: Secondary | ICD-10-CM

## 2019-09-20 MED ORDER — BEVACIZUMAB CHEMO INJECTION 1.25MG/0.05ML SYRINGE FOR KALEIDOSCOPE
1.2500 mg | INTRAVITREAL | Status: AC | PRN
  Administered 2019-09-20: 1.25 mg via INTRAVITREAL

## 2019-10-16 NOTE — Progress Notes (Signed)
[facility] Note  10/18/2019     CHIEF COMPLAINT Patient presents for Retina Follow Up   HISTORY OF PRESENT ILLNESS: Desiree Pollard is a 73 y.o. female who presents to the clinic today for:   HPI    Retina Follow Up    Patient presents with  Diabetic Retinopathy.  In both eyes.  This started 4 weeks ago.  Severity is moderate.  I, the attending physician,  performed the HPI with the patient and updated documentation appropriately.          Comments    Patient here for 4 weeks retina follow up for NPDR with DME OU. Patient states vision has to read larger print. No eye pain.        Last edited by Bernarda Caffey, MD on 10/18/2019  2:39 PM. (History)    pt states her vision is the same as last time, but states she is having a hard time reading   Referring physician: Phylliss Blakes, [place],  [place] [address]  HISTORICAL INFORMATION:   Selected notes from the MEDICAL RECORD NUMBER Referred by Dr. Angelena Form for DM exam LEE: 02.04.20 (A. Hager) [BCVA: OD: 20/400 OS: 20/150] Ocular Hx-ptosis OU, pseudo OU, diabetic ret OU PMH-hx of stroke (1 month), cancer, DM    CURRENT MEDICATIONS: Current Outpatient Medications (Ophthalmic Drugs)  Medication Sig  . brimonidine (ALPHAGAN) 0.2 % ophthalmic solution Place 1 drop into both eyes 2 (two) times daily.  . dorzolamide-timolol (COSOPT) 22.3-6.8 MG/ML ophthalmic solution Place 1 drop into both eyes 2 (two) times daily.   No current facility-administered medications for this visit.  (Ophthalmic Drugs)   Current Outpatient Medications (Other)  Medication Sig  . Accu-Chek FastClix Lancets MISC USE AS DIRECTED TO TEST BLOOD GLUCOSE DAILY  . amLODipine (NORVASC) 5 MG tablet Take by mouth.  Marland Kitchen aspirin EC 81 MG tablet Take by mouth.  Marland Kitchen atorvastatin (LIPITOR) 80 MG tablet Take by mouth.  . Blood Glucose Monitoring Suppl (FIFTY50 GLUCOSE METER 2.0) w/Device KIT Use to check blood sugars.  Freestyle glucose meter. Okay to substitute for other meter preferred by insurance. ICD-10 E11.9.  Marland Kitchen carvedilol (COREG) 25 MG tablet Take by mouth.  . cefTRIAXone (ROCEPHIN) 1 g injection   . cephALEXin (KEFLEX) 500 MG capsule   . enalapril (VASOTEC) 5 MG tablet Take 5 mg by mouth daily.  . ferrous sulfate 325 (65 FE) MG tablet Take by mouth.  . insulin lispro (HUMALOG) 100 UNIT/ML injection Inject into the skin.  Marland Kitchen insulin NPH-regular Human (70-30) 100 UNIT/ML injection Inject into the skin.  . Insulin Pen Needle (FIFTY50 PEN NEEDLES) 32G X 4 MM MISC Needles for insulin pen. Inject as instructed E11.9  . KLOR-CON M20 20 MEQ tablet Take 20 mEq by mouth daily.  Marland Kitchen lisinopril (PRINIVIL,ZESTRIL) 20 MG tablet Take 40 mg by mouth daily.  Marland Kitchen loperamide (IMODIUM) 2 MG capsule Take by mouth.  . meclizine (ANTIVERT) 25 MG tablet Take 1 tablet (25 mg total) by mouth 3 (three) times daily as needed for dizziness.  . ondansetron (ZOFRAN) 4 MG tablet Take 4 mg by mouth every 6 (six) hours as needed.   Current Facility-Administered Medications (Other)  Medication Route  . triamcinolone acetonide (KENALOG-40) injection 4 mg Intravitreal  . triamcinolone acetonide (KENALOG-40) injection 4 mg Intravitreal      REVIEW OF SYSTEMS: ROS    Positive for: Musculoskeletal, Endocrine, Cardiovascular, Eyes   Negative for: Constitutional,  Gastrointestinal, Neurological, Skin, Genitourinary, HENT, Respiratory, Psychiatric, Allergic/Imm, Heme/Lymph   Last edited by Theodore Demark, COA on 10/18/2019  2:06 PM. (History)       ALLERGIES No Known Allergies  PAST MEDICAL HISTORY Past Medical History:  Diagnosis Date  . Cancer ([place])   . CVA (cerebral vascular accident) ([place])   . Diabetes mellitus without complication ([place])   . Diabetic retinopathy ([place])    PDR OU  . Hypertensive retinopathy    OU  . Stroke [facility])    Past Surgical History:  Procedure Laterality Date  . CATARACT EXTRACTION    . EYE  SURGERY    . MASTECTOMY Bilateral     FAMILY HISTORY History reviewed. No pertinent family history.  SOCIAL HISTORY Social History   Tobacco Use  . Smoking status: Former Research scientist (life sciences)  . Smokeless tobacco: Never Used  Substance Use Topics  . Alcohol use: Not Currently  . Drug use: Never         OPHTHALMIC EXAM:  Base Eye Exam    Visual Acuity (Snellen - Linear)      Right Left   Dist [place] 20/80 +1 20/70 -2   Dist ph [place] 20/70 -2        Tonometry (Tonopen, 2:03 PM)      Right Left   Pressure 19 18       Pupils      Dark Light Shape React APD   Right 4 3 Round Slow None   Left 4 3 Round Slow None       Visual Fields (Counting fingers)      Left Right    Full Full       Extraocular Movement      Right Left    Full, Ortho Full, Ortho       Neuro/Psych    Oriented x3: Yes   Mood/Affect: Normal       Dilation    Both eyes: 1.0% Mydriacyl, 2.5% Phenylephrine @ 2:03 PM        Slit Lamp and Fundus Exam    Slit Lamp Exam      Right Left   Lids/Lashes Dermatochalasis - upper lid, nasal Ectropion - lower lid Dermatochalasis - upper lid, Meibomian gland dysfunction, mild, nasal Ectropion, lower lid   Conjunctiva/Sclera nasal and temporal Pinguecula, Melanosis nasal and temporal Pinguecula, Melanosis   Cornea mild Arcus, Well healed cataract wounds, tear film debris mild Arcus, Well healed cataract wounds, 1+ Punctate epithelial erosions   Anterior Chamber deep and clear deep and clear   Iris Round and reactive Round and poorly dilated to 72m, No NVI   Lens Posterior chamber intraocular lens, trace Posterior capsular opacification Posterior chamber intraocular lens, trace Posterior capsular opacification   Vitreous Vitreous syneresis, vast improvement in VH -- clearing centrally and settling inferiorly--mild residual old heme at 06:00. Vitreous syneresis       Fundus Exam      Right Left   Disc mild Pallor, sharp rim, fibrosis superiorly and surrounding IRH Pink  and sharp, fine NVD - regressing, sharp rim   C/D Ratio 0.1 0.1   Macula Blunted foveal reflex, +central edema, +ERM, scattered exudates/MA - improved, +fibrosis Blunted foveal reflex, Epiretinal membrane, NVE, scattered IRH/exudates - improved, CWS IN to macula, pseudo hole/macular cyst   Vessels Vascular attenuation, Tortuous, +fibrosis along ST arcades severe Vascular attenuation, Tortuous   Periphery Attached, scattered IRH/MA, good 360 PRP fill in Attached, scattered IRH, 360 1PRP w/ good posterior fill-in  IMAGING AND PROCEDURES  Imaging and Procedures for _0 @  OCT, Retina - OU - Both Eyes       Right Eye Quality was good. Central Foveal Thickness: 436. Progression has been stable. Findings include abnormal foveal contour, intraretinal fluid, intraretinal hyper-reflective material, no SRF, outer retinal atrophy, epiretinal membrane (Persistent IRF, ?mild improvement).   Left Eye Quality was good. Central Foveal Thickness: 330. Progression has been stable. Findings include abnormal foveal contour, intraretinal fluid, epiretinal membrane, no SRF, preretinal fibrosis, vitreous traction (Persistent IRF with +VMT).   Notes *Images captured and stored on drive  Diagnosis / Impression:  DME OU Mild ERM OU OD: Persistent IRF, ?mild improvement  OS: persistent IRF with +VMT   Clinical management:  See below  Abbreviations: NFP - Normal foveal profile. CME - cystoid macular edema. PED - pigment epithelial detachment. IRF - intraretinal fluid. SRF - subretinal fluid. EZ - ellipsoid zone. ERM - epiretinal membrane. ORA - outer retinal atrophy. ORT - outer retinal tubulation. SRHM - subretinal hyper-reflective material        Intravitreal Injection, Pharmacologic Agent - OD - Right Eye       Time Out 10/18/2019. 2:38 PM. Confirmed correct patient, procedure, site, and patient consented.   Anesthesia Topical anesthesia was used. Anesthetic medications included  Lidocaine 2%, Proparacaine 0.5%.   Procedure Preparation included 5% betadine to ocular surface, eyelid speculum. A supplied needle was used.   Injection:  1.25 mg Bevacizumab (AVASTIN) SOLN   NDC: 16945-038-88, Lot: [id number] , Expiration date: 11/29/2019   Route: Intravitreal, Site: Right Eye, Waste: 0 mL  Post-op Post injection exam found visual acuity of at least counting fingers. The patient tolerated the procedure well. There were no complications. The patient received written and verbal post procedure care education.        Intravitreal Injection, Pharmacologic Agent - OS - Left Eye       Time Out 10/18/2019. 2:39 PM. Confirmed correct patient, procedure, site, and patient consented.   Anesthesia Topical anesthesia was used. Anesthetic medications included Lidocaine 2%, Proparacaine 0.5%.   Procedure Preparation included 5% betadine to ocular surface, eyelid speculum. A 30 gauge needle was used.   Injection:  1.25 mg Bevacizumab (AVASTIN) SOLN   NDC: 28003-491-79, Lot: [id number] , Expiration date: 12/27/2019   Route: Intravitreal, Site: Left Eye, Waste: 0 mL  Post-op Post injection exam found visual acuity of at least counting fingers. The patient tolerated the procedure well. There were no complications. The patient received written and verbal post procedure care education.                 ASSESSMENT/PLAN:    ICD-10-CM   1. Proliferative diabetic retinopathy of both eyes with macular edema associated with type 2 diabetes mellitus (HCC)  G54.9826 Intravitreal Injection, Pharmacologic Agent - OD - Right Eye    Intravitreal Injection, Pharmacologic Agent - OS - Left Eye    Bevacizumab (AVASTIN) SOLN 1.25 mg    Bevacizumab (AVASTIN) SOLN 1.25 mg  2. Vitreous hemorrhage of right eye ([place])  H43.11   3. 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 Essential hypertension  I10   6. Hypertensive  retinopathy of both eyes  H35.033   7. Pseudophakia of both eyes  Z96.1   8. History of stroke  Z86.73   9. Ocular hypertension, bilateral  H40.053     1-3. Proliferative diabetic retinopathy w/ DME OU  -  lost to f/u 3.23.2020 to 7.13.2020 due to [place] restrictions  - VH OD onset 2.25.20 -- clearing centrally and settling inferiorly  - at initial presentation - difficult exam due to poor patient cooperation -- history of stroke -- improved now   - initial exam showed central edema OU and scattered IRH; +fine NVD OS; early flat NVE OD  - FA (08.19.20) shows NV nasal to disc OD; OS with NV along SN and ST arcades  - discussed findings, prognosis and treatment options for DME and proliferative diabetic retinopathy  - s/p PRP OS (02.18.20), fill-in (08.19.20)  - s/p PRP OD (03.23.30), fill-in (09.16.20)  - s/p IVTA OU #1 (02.25.20), #2 (03.23.20) -- lost to f/u due to COVID restrictions  - s/p IVA OU #1 (07.31.20), #2 (09.02.20), #3 (10.07.20)   - IOP 19,18 today  - cont Cosopt BID OU  - cont brimonidine BID OU  - BCVA stable: 20/80 OD, 20/70 OS  - OCT shows persistent IRF OD, persistent IRF with VMT OS -- ?IVA resistance  - discussed possible switch in medications  - recommend IVA OU #4 today, 11.04.20, but will initiate Eylea4U benefits investigation  - pt wishes to proceed  - RBA of procedure discussed, questions answered  - informed consent obtained and signed  - see procedure note  - Eylea4U benefits investigation started 11.4.20  - f/u 4 weeks, DFE, OCT, possible injections OU  4. VMT OS  - stable VMT, contributing to central edema OS  - monitor  5,6. Hypertensive retinopathy OU  - discussed importance of tight BP control and its relation to 1,2 above  - monitor  7. Pseudophakia OU  - s/p CE/IOL OU  - doing well  - monitor  8. History of stroke  - ischemic stroke on 12/26/2018  - pt reports some visual decline following stroke  9. Ocular Hypertension  OU  - ?steroid response  - IOP okay today on Cosopt and brimonidine  - cont Cosopt BID OU  - cont brimonidine BID OU  - monitor    Ophthalmic Meds Ordered this visit:  Meds ordered this encounter  Medications  . Bevacizumab (AVASTIN) SOLN 1.25 mg  . Bevacizumab (AVASTIN) SOLN 1.25 mg       Return in about 4 weeks (around 11/15/2019) for f/u PDR OU, DFE, OCT.  There are no Patient Instructions on file for this visit.   Explained the diagnoses, plan, and follow up with the patient and they expressed understanding.  Patient expressed understanding of the importance of proper follow up care.   This document serves as a record of services personally performed by Gardiner Sleeper, MD, PhD. It was created on their behalf by Roselee Nova, COMT. The creation of this record is the provider's dictation and/or activities during the visit.  Electronically signed by: Roselee Nova, COMT 10/18/19 4:27 PM  Gardiner Sleeper, M.D., Ph.D. Diseases & Surgery of the Retina and [place] 10/18/19  I have reviewed the above documentation for accuracy and completeness, and I agree with the above. Gardiner Sleeper, M.D., Ph.D. 10/18/19 4:27 PM    Abbreviations: M myopia (nearsighted); A astigmatism; H hyperopia (farsighted); P presbyopia; Mrx spectacle prescription;  CTL contact lenses; OD right eye; OS left eye; OU both eyes  XT exotropia; ET esotropia; PEK punctate epithelial keratitis; PEE punctate epithelial erosions; DES dry eye syndrome; MGD meibomian gland dysfunction; ATs artificial tears; PFAT's preservative free artificial tears; [place] nuclear sclerotic cataract;  PSC posterior subcapsular cataract; ERM epi-retinal membrane; PVD posterior vitreous detachment; RD retinal detachment; DM diabetes mellitus; DR diabetic retinopathy; NPDR non-proliferative diabetic retinopathy; PDR proliferative diabetic retinopathy; CSME clinically significant macular edema; DME diabetic  macular edema; dbh dot blot hemorrhages; CWS cotton wool spot; POAG primary open angle glaucoma; C/D cup-to-disc ratio; HVF humphrey visual field; GVF goldmann visual field; OCT optical coherence tomography; IOP intraocular pressure; BRVO Branch retinal vein occlusion; CRVO central retinal vein occlusion; CRAO central retinal artery occlusion; BRAO branch retinal artery occlusion; RT retinal tear; SB scleral buckle; PPV pars plana vitrectomy; VH Vitreous hemorrhage; PRP panretinal laser photocoagulation; IVK intravitreal kenalog; VMT vitreomacular traction; MH Macular hole;  NVD neovascularization of the disc; NVE neovascularization elsewhere; AREDS age related eye disease study; ARMD age related macular degeneration; POAG primary open angle glaucoma; EBMD epithelial/anterior basement membrane dystrophy; ACIOL anterior chamber intraocular lens; IOL intraocular lens; PCIOL posterior chamber intraocular lens; Phaco/IOL phacoemulsification with intraocular lens placement; [place] photorefractive keratectomy; LASIK laser assisted in situ keratomileusis; HTN hypertension; DM diabetes mellitus; COPD chronic obstructive pulmonary disease

## 2019-10-18 ENCOUNTER — Encounter (INDEPENDENT_AMBULATORY_CARE_PROVIDER_SITE_OTHER): Payer: Self-pay | Admitting: Ophthalmology

## 2019-10-18 ENCOUNTER — Ambulatory Visit (INDEPENDENT_AMBULATORY_CARE_PROVIDER_SITE_OTHER): Payer: Medicare Other | Admitting: Ophthalmology

## 2019-10-18 DIAGNOSIS — H43822 Vitreomacular adhesion, left eye: Secondary | ICD-10-CM

## 2019-10-18 DIAGNOSIS — Z8673 Personal history of transient ischemic attack (TIA), and cerebral infarction without residual deficits: Secondary | ICD-10-CM

## 2019-10-18 DIAGNOSIS — H4311 Vitreous hemorrhage, right eye: Secondary | ICD-10-CM

## 2019-10-18 DIAGNOSIS — H40053 Ocular hypertension, bilateral: Secondary | ICD-10-CM

## 2019-10-18 DIAGNOSIS — H35033 Hypertensive retinopathy, bilateral: Secondary | ICD-10-CM

## 2019-10-18 DIAGNOSIS — Z961 Presence of intraocular lens: Secondary | ICD-10-CM

## 2019-10-18 DIAGNOSIS — H3581 Retinal edema: Secondary | ICD-10-CM

## 2019-10-18 DIAGNOSIS — E113513 Type 2 diabetes mellitus with proliferative diabetic retinopathy with macular edema, bilateral: Secondary | ICD-10-CM | POA: Diagnosis not present

## 2019-10-18 DIAGNOSIS — I10 Essential (primary) hypertension: Secondary | ICD-10-CM

## 2019-10-18 MED ORDER — BEVACIZUMAB CHEMO INJECTION 1.25MG/0.05ML SYRINGE FOR KALEIDOSCOPE
1.2500 mg | INTRAVITREAL | Status: AC | PRN
  Administered 2019-10-18: 1.25 mg via INTRAVITREAL

## 2019-11-14 NOTE — Progress Notes (Signed)
[facility] Note  11/15/2019     CHIEF COMPLAINT Patient presents for Retina Follow Up   HISTORY OF PRESENT ILLNESS: Desiree Pollard is a 73 y.o. female who presents to the clinic today for:   HPI    Retina Follow Up    Patient presents with  Diabetic Retinopathy.  In both eyes.  This started 4 weeks ago.  Severity is moderate.  I, the attending physician,  performed the HPI with the patient and updated documentation appropriately.          Comments    Patient here for 4 weeks retina follow up for PDR with DME OU. Patient states vision still can't see small reading stuff. No eye pain.        Last edited by Bernarda Caffey, MD on 11/15/2019  9:53 PM. (History)    pt states she is doing okay   Referring physician: Phylliss Blakes, [place],  [place] [id number]  HISTORICAL INFORMATION:   Selected notes from the MEDICAL RECORD NUMBER Referred by Dr. Angelena Form for DM exam LEE: 02.04.20 (A. Hager) [BCVA: OD: 20/400 OS: 20/150] Ocular Hx-ptosis OU, pseudo OU, diabetic ret OU PMH-hx of stroke (1 month), cancer, DM    CURRENT MEDICATIONS: Current Outpatient Medications (Ophthalmic Drugs)  Medication Sig  . brimonidine (ALPHAGAN) 0.2 % ophthalmic solution Place 1 drop into both eyes 2 (two) times daily.  . dorzolamide-timolol (COSOPT) 22.3-6.8 MG/ML ophthalmic solution Place 1 drop into both eyes 2 (two) times daily.   No current facility-administered medications for this visit.  (Ophthalmic Drugs)   Current Outpatient Medications (Other)  Medication Sig  . Accu-Chek FastClix Lancets MISC USE AS DIRECTED TO TEST BLOOD GLUCOSE DAILY  . amLODipine (NORVASC) 5 MG tablet Take by mouth.  Marland Kitchen aspirin EC 81 MG tablet Take by mouth.  Marland Kitchen atorvastatin (LIPITOR) 80 MG tablet Take by mouth.  . Blood Glucose Monitoring Suppl (FIFTY50 GLUCOSE METER 2.0) w/Device KIT Use to check blood sugars. Freestyle glucose meter. Okay to substitute for other meter  preferred by insurance. ICD-10 E11.9.  Marland Kitchen carvedilol (COREG) 25 MG tablet Take by mouth.  . cefTRIAXone (ROCEPHIN) 1 g injection   . cephALEXin (KEFLEX) 500 MG capsule   . enalapril (VASOTEC) 5 MG tablet Take 5 mg by mouth daily.  . ferrous sulfate 325 (65 FE) MG tablet Take by mouth.  . insulin lispro (HUMALOG) 100 UNIT/ML injection Inject into the skin.  Marland Kitchen insulin NPH-regular Human (70-30) 100 UNIT/ML injection Inject into the skin.  . Insulin Pen Needle (FIFTY50 PEN NEEDLES) 32G X 4 MM MISC Needles for insulin pen. Inject as instructed E11.9  . KLOR-CON M20 20 MEQ tablet Take 20 mEq by mouth daily.  Marland Kitchen lisinopril (PRINIVIL,ZESTRIL) 20 MG tablet Take 40 mg by mouth daily.  Marland Kitchen loperamide (IMODIUM) 2 MG capsule Take by mouth.  . meclizine (ANTIVERT) 25 MG tablet Take 1 tablet (25 mg total) by mouth 3 (three) times daily as needed for dizziness.  . ondansetron (ZOFRAN) 4 MG tablet Take 4 mg by mouth every 6 (six) hours as needed.   Current Facility-Administered Medications (Other)  Medication Route  . triamcinolone acetonide (KENALOG-40) injection 4 mg Intravitreal  . triamcinolone acetonide (KENALOG-40) injection 4 mg Intravitreal      REVIEW OF SYSTEMS: ROS    Positive for: Musculoskeletal, Endocrine, Cardiovascular, Eyes   Negative for: Constitutional, Gastrointestinal, Neurological, Skin, Genitourinary, HENT, Respiratory, Psychiatric, Allergic/Imm, Heme/Lymph   Last  edited by Theodore Demark, COA on 11/15/2019  1:49 PM. (History)       ALLERGIES No Known Allergies  PAST MEDICAL HISTORY Past Medical History:  Diagnosis Date  . Cancer ([place])   . CVA (cerebral vascular accident) ([place])   . Diabetes mellitus without complication ([place])   . Diabetic retinopathy ([place])    PDR OU  . Hypertensive retinopathy    OU  . Stroke [facility])    Past Surgical History:  Procedure Laterality Date  . CATARACT EXTRACTION    . EYE SURGERY    . MASTECTOMY Bilateral     FAMILY HISTORY History  reviewed. No pertinent family history.  SOCIAL HISTORY Social History   Tobacco Use  . Smoking status: Former Research scientist (life sciences)  . Smokeless tobacco: Never Used  Substance Use Topics  . Alcohol use: Not Currently  . Drug use: Never         OPHTHALMIC EXAM:  Base Eye Exam    Visual Acuity (Snellen - Linear)      Right Left   Dist [place] 20/60 -2 20/80 +1   Dist ph [place] NI NI       Tonometry (Tonopen, 1:46 PM)      Right Left   Pressure 18 18       Pupils      Dark Light Shape React APD   Right 4 3 Round Slow None   Left 4 3 Round Slow None       Visual Fields (Counting fingers)      Left Right    Full Full       Extraocular Movement      Right Left    Full, Ortho Full, Ortho       Neuro/Psych    Oriented x3: Yes   Mood/Affect: Normal       Dilation    Both eyes: 1.0% Mydriacyl, 2.5% Phenylephrine @ 1:43 PM        Slit Lamp and Fundus Exam    Slit Lamp Exam      Right Left   Lids/Lashes Dermatochalasis - upper lid, nasal Ectropion - lower lid Dermatochalasis - upper lid, Meibomian gland dysfunction, mild, nasal Ectropion, lower lid   Conjunctiva/Sclera nasal and temporal Pinguecula, Melanosis nasal and temporal Pinguecula, Melanosis   Cornea mild Arcus, Well healed cataract wounds, tear film debris mild Arcus, Well healed cataract wounds, 1+ Punctate epithelial erosions   Anterior Chamber deep and clear deep and clear   Iris Round and reactive Round and poorly dilated to 28m, No NVI   Lens Posterior chamber intraocular lens, trace Posterior capsular opacification Posterior chamber intraocular lens, trace Posterior capsular opacification   Vitreous Vitreous syneresis, vast improvement in VH -- clearing centrally and settling inferiorly--mild residual old heme at 06:00. Vitreous syneresis       Fundus Exam      Right Left   Disc mild Pallor, sharp rim, fibrosis superiorly and surrounding IRH Pink and sharp, fine NVD - regressing, sharp rim   C/D Ratio 0.1 0.1    Macula Blunted foveal reflex, +central edema, +ERM, scattered exudates/MA - improved, +focal fibrosis Blunted foveal reflex, Epiretinal membrane, NVE, scattered IRH/exudates - improved, scattered CWS along IN arcades, pseudo hole/macular cyst   Vessels Vascular attenuation, Tortuous, +fibrosis along ST arcades severe Vascular attenuation, Tortuous   Periphery Attached, scattered IRH/MA, good 360 PRP fill in Attached, scattered IRH, 360 1PRP w/ good posterior fill-in          IMAGING AND PROCEDURES  Imaging and Procedures for @TODAY @  OCT, Retina - OU - Both Eyes       Right Eye Quality was good. Central Foveal Thickness: 465. Progression has worsened. Findings include abnormal foveal contour, intraretinal fluid, intraretinal hyper-reflective material, no SRF, outer retinal atrophy, epiretinal membrane (Interval increase in IRF).   Left Eye Quality was good. Central Foveal Thickness: 725. Progression has worsened. Findings include abnormal foveal contour, intraretinal fluid, epiretinal membrane, no SRF, preretinal fibrosis, vitreous traction (Mild increase in IRF with +VMT).   Notes *Images captured and stored on drive  Diagnosis / Impression:  DME OU Mild ERM OU OD: Interval increase in IRF OS: Mild increase in IRF with +VMT   Clinical management:  See below  Abbreviations: NFP - Normal foveal profile. CME - cystoid macular edema. PED - pigment epithelial detachment. IRF - intraretinal fluid. SRF - subretinal fluid. EZ - ellipsoid zone. ERM - epiretinal membrane. ORA - outer retinal atrophy. ORT - outer retinal tubulation. SRHM - subretinal hyper-reflective material        Intravitreal Injection, Pharmacologic Agent - OD - Right Eye       Time Out 11/15/2019. 1:45 PM. Confirmed correct patient, procedure, site, and patient consented.   Anesthesia Topical anesthesia was used. Anesthetic medications included Lidocaine 2%, Proparacaine 0.5%.   Procedure Preparation  included 5% betadine to ocular surface, eyelid speculum. A 30 gauge needle was used.   Injection:  2 mg aflibercept Alfonse Flavors) SOLN   NDC: A3590391, Lot: [id number], Expiration date: 05/09/2020   Route: Intravitreal, Site: Right Eye, Waste: 0.05 mL  Post-op Post injection exam found visual acuity of at least counting fingers. The patient tolerated the procedure well. There were no complications. The patient received written and verbal post procedure care education.        Intravitreal Injection, Pharmacologic Agent - OS - Left Eye       Time Out 11/15/2019. 1:46 PM. Confirmed correct patient, procedure, site, and patient consented.   Anesthesia Topical anesthesia was used. Anesthetic medications included Lidocaine 2%, Proparacaine 0.5%.   Procedure Preparation included 5% betadine to ocular surface, eyelid speculum. A 30 gauge needle was used.   Injection:  2 mg aflibercept Alfonse Flavors) SOLN   NDC: M7179715, Lot: [id number], Expiration date: 06/09/2020   Route: Intravitreal, Site: Left Eye, Waste: 0.05 mL  Post-op Post injection exam found visual acuity of at least counting fingers. The patient tolerated the procedure well. There were no complications. The patient received written and verbal post procedure care education.                 ASSESSMENT/PLAN:    ICD-10-CM   1. Proliferative diabetic retinopathy of both eyes with macular edema associated with type 2 diabetes mellitus (HCC)  S96.2836 Intravitreal Injection, Pharmacologic Agent - OD - Right Eye    Intravitreal Injection, Pharmacologic Agent - OS - Left Eye    aflibercept (EYLEA) SOLN 2 mg    aflibercept (EYLEA) SOLN 2 mg  2. Vitreous hemorrhage of right eye ([place])  H43.11   3. 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 Essential hypertension  I10   6. Hypertensive retinopathy of both eyes  H35.033   7. Pseudophakia of both eyes  Z96.1   8. History  of stroke  Z86.73   9. Ocular hypertension, bilateral  H40.053     1-3. Proliferative diabetic retinopathy w/ DME OU  - lost to  f/u 3.23.2020 to 7.13.2020 due to [place] restrictions  - VH OD onset 2.25.20 -- clearing centrally and settling inferiorly  - at initial presentation - difficult exam due to poor patient cooperation -- history of stroke -- improved now   - initial exam showed central edema OU and scattered IRH; +fine NVD OS; early flat NVE OD  - FA (08.19.20) shows NV nasal to disc OD; OS with NV along SN and ST arcades  - discussed findings, prognosis and treatment options for DME and proliferative diabetic retinopathy  - s/p PRP OS (02.18.20), fill-in (08.19.20)  - s/p PRP OD (03.23.30), fill-in (09.16.20)  - s/p IVTA OU #1 (02.25.20), #2 (03.23.20) -- lost to f/u due to COVID restrictions  - s/p IVA OU #1 (07.31.20), #2 (09.02.20), #3 (10.07.20), #4 (11.04.20)  - IOP 18 OU today  - cont Cosopt BID OU  - cont brimonidine BID OU  - BCVA: improved to 20/60 from 20/80 OD, slightly decreased to 20/80 from 20/70 OS  - OCT shows interval increase in IRF OD, mild increase in IRF with VMT OS -- ?IVA resistance  - discussed possible switch in medications to IVE  - recommend IVE OU #1 today, 12.02.20  - pt wishes to proceed  - RBA of procedure discussed, questions answered  - informed consent obtained and signed  - see procedure note  - Eylea4U benefits investigation started 11.4.20 -- approved as of 12.02.20  - f/u 4 weeks, DFE, OCT, possible injections OU  4. VMT OS  - stable VMT, contributing to central edema OS  - monitor  5,6. Hypertensive retinopathy OU  - discussed importance of tight BP control and its relation to 1,2 above  - monitor  7. Pseudophakia OU  - s/p CE/IOL OU  - doing well  - monitor  8. History of stroke  - ischemic stroke on 12/26/2018  - pt reports some visual decline following stroke  9. Ocular Hypertension OU  - ?steroid response  -  IOP okay today on Cosopt and brimonidine  - cont Cosopt BID OU  - cont brimonidine BID OU  - monitor    Ophthalmic Meds Ordered this visit:  Meds ordered this encounter  Medications  . aflibercept (EYLEA) SOLN 2 mg  . aflibercept (EYLEA) SOLN 2 mg       Return in about 5 weeks (around 12/20/2019) for f/u PDR OU, DFE, OCT.  There are no Patient Instructions on file for this visit.   Explained the diagnoses, plan, and follow up with the patient and they expressed understanding.  Patient expressed understanding of the importance of proper follow up care.   This document serves as a record of services personally performed by Gardiner Sleeper, MD, PhD. It was created on their behalf by Roselee Nova, COMT. The creation of this record is the provider's dictation and/or activities during the visit.  Electronically signed by: Roselee Nova, COMT 11/15/19 10:06 PM   This document serves as a record of services personally performed by Gardiner Sleeper, MD, PhD. It was created on their behalf by Ernest Mallick, OA, an ophthalmic assistant. The creation of this record is the provider's dictation and/or activities during the visit.    Electronically signed by: Ernest Mallick, OA 12.02.2020 10:06 PM  Gardiner Sleeper, M.D., Ph.D. Diseases & Surgery of the Retina and [place] 11/15/2019   I have reviewed the above documentation for accuracy and completeness, and I agree with the above.  Gardiner Sleeper, M.D., Ph.D. 11/15/19 10:06 PM   Abbreviations: M myopia (nearsighted); A astigmatism; H hyperopia (farsighted); P presbyopia; Mrx spectacle prescription;  CTL contact lenses; OD right eye; OS left eye; OU both eyes  XT exotropia; ET esotropia; PEK punctate epithelial keratitis; PEE punctate epithelial erosions; DES dry eye syndrome; MGD meibomian gland dysfunction; ATs artificial tears; PFAT's preservative free artificial tears; [place] nuclear sclerotic cataract; PSC  posterior subcapsular cataract; ERM epi-retinal membrane; PVD posterior vitreous detachment; RD retinal detachment; DM diabetes mellitus; DR diabetic retinopathy; NPDR non-proliferative diabetic retinopathy; PDR proliferative diabetic retinopathy; CSME clinically significant macular edema; DME diabetic macular edema; dbh dot blot hemorrhages; CWS cotton wool spot; POAG primary open angle glaucoma; C/D cup-to-disc ratio; HVF humphrey visual field; GVF goldmann visual field; OCT optical coherence tomography; IOP intraocular pressure; BRVO Branch retinal vein occlusion; CRVO central retinal vein occlusion; CRAO central retinal artery occlusion; BRAO branch retinal artery occlusion; RT retinal tear; SB scleral buckle; PPV pars plana vitrectomy; VH Vitreous hemorrhage; PRP panretinal laser photocoagulation; IVK intravitreal kenalog; VMT vitreomacular traction; MH Macular hole;  NVD neovascularization of the disc; NVE neovascularization elsewhere; AREDS age related eye disease study; ARMD age related macular degeneration; POAG primary open angle glaucoma; EBMD epithelial/anterior basement membrane dystrophy; ACIOL anterior chamber intraocular lens; IOL intraocular lens; PCIOL posterior chamber intraocular lens; Phaco/IOL phacoemulsification with intraocular lens placement; [place] photorefractive keratectomy; LASIK laser assisted in situ keratomileusis; HTN hypertension; DM diabetes mellitus; COPD chronic obstructive pulmonary disease

## 2019-11-15 ENCOUNTER — Encounter (INDEPENDENT_AMBULATORY_CARE_PROVIDER_SITE_OTHER): Payer: Self-pay | Admitting: Ophthalmology

## 2019-11-15 ENCOUNTER — Ambulatory Visit (INDEPENDENT_AMBULATORY_CARE_PROVIDER_SITE_OTHER): Payer: Medicare Other | Admitting: Ophthalmology

## 2019-11-15 ENCOUNTER — Other Ambulatory Visit: Payer: Self-pay

## 2019-11-15 DIAGNOSIS — H43822 Vitreomacular adhesion, left eye: Secondary | ICD-10-CM

## 2019-11-15 DIAGNOSIS — H4311 Vitreous hemorrhage, right eye: Secondary | ICD-10-CM | POA: Diagnosis not present

## 2019-11-15 DIAGNOSIS — H35033 Hypertensive retinopathy, bilateral: Secondary | ICD-10-CM

## 2019-11-15 DIAGNOSIS — H3581 Retinal edema: Secondary | ICD-10-CM

## 2019-11-15 DIAGNOSIS — Z8673 Personal history of transient ischemic attack (TIA), and cerebral infarction without residual deficits: Secondary | ICD-10-CM

## 2019-11-15 DIAGNOSIS — I10 Essential (primary) hypertension: Secondary | ICD-10-CM

## 2019-11-15 DIAGNOSIS — E113513 Type 2 diabetes mellitus with proliferative diabetic retinopathy with macular edema, bilateral: Secondary | ICD-10-CM

## 2019-11-15 DIAGNOSIS — H40053 Ocular hypertension, bilateral: Secondary | ICD-10-CM

## 2019-11-15 DIAGNOSIS — Z961 Presence of intraocular lens: Secondary | ICD-10-CM

## 2019-11-15 MED ORDER — AFLIBERCEPT 2MG/0.05ML IZ SOLN FOR KALEIDOSCOPE
2.0000 mg | INTRAVITREAL | Status: AC | PRN
  Administered 2019-11-15: 2 mg via INTRAVITREAL

## 2019-12-22 ENCOUNTER — Encounter (INDEPENDENT_AMBULATORY_CARE_PROVIDER_SITE_OTHER): Payer: Medicare Other | Admitting: Ophthalmology

## 2020-01-14 ENCOUNTER — Other Ambulatory Visit (INDEPENDENT_AMBULATORY_CARE_PROVIDER_SITE_OTHER): Payer: Self-pay | Admitting: Ophthalmology

## 2020-05-07 ENCOUNTER — Ambulatory Visit: Payer: Medicare Other | Admitting: Family Medicine

## 2020-06-10 ENCOUNTER — Ambulatory Visit (INDEPENDENT_AMBULATORY_CARE_PROVIDER_SITE_OTHER): Payer: Medicare HMO | Admitting: Family Medicine

## 2020-06-10 ENCOUNTER — Other Ambulatory Visit: Payer: Self-pay

## 2020-06-10 ENCOUNTER — Encounter: Payer: Self-pay | Admitting: Family Medicine

## 2020-06-10 VITALS — BP 132/54 | HR 59 | Temp 97.3°F | Ht 67.0 in | Wt 137.0 lb

## 2020-06-10 DIAGNOSIS — I10 Essential (primary) hypertension: Secondary | ICD-10-CM

## 2020-06-10 DIAGNOSIS — Z0000 Encounter for general adult medical examination without abnormal findings: Secondary | ICD-10-CM | POA: Diagnosis not present

## 2020-06-10 DIAGNOSIS — R739 Hyperglycemia, unspecified: Secondary | ICD-10-CM

## 2020-06-10 DIAGNOSIS — E1165 Type 2 diabetes mellitus with hyperglycemia: Secondary | ICD-10-CM

## 2020-06-10 DIAGNOSIS — R7309 Other abnormal glucose: Secondary | ICD-10-CM

## 2020-06-10 DIAGNOSIS — Z794 Long term (current) use of insulin: Secondary | ICD-10-CM

## 2020-06-10 DIAGNOSIS — Z8673 Personal history of transient ischemic attack (TIA), and cerebral infarction without residual deficits: Secondary | ICD-10-CM

## 2020-06-10 DIAGNOSIS — Z09 Encounter for follow-up examination after completed treatment for conditions other than malignant neoplasm: Secondary | ICD-10-CM

## 2020-06-10 DIAGNOSIS — Z7689 Persons encountering health services in other specified circumstances: Secondary | ICD-10-CM | POA: Diagnosis not present

## 2020-06-10 LAB — POCT GLYCOSYLATED HEMOGLOBIN (HGB A1C)
HbA1c POC (<> result, manual entry): 6.2 % (ref 4.0–5.6)
HbA1c, POC (controlled diabetic range): 6.2 % (ref 0.0–7.0)
HbA1c, POC (prediabetic range): 6.2 % (ref 5.7–6.4)
Hemoglobin A1C: 6.2 % — AB (ref 4.0–5.6)

## 2020-06-10 LAB — GLUCOSE, POCT (MANUAL RESULT ENTRY): POC Glucose: 125 mg/dl — AB (ref 70–99)

## 2020-06-10 NOTE — Progress Notes (Signed)
[facility]   Established Patient Office Visit  Subjective:  Patient ID: Desiree Pollard, female    DOB: 06/12/1946  Age: 74 y.o. MRN: [id number]  CC:  Chief Complaint  Patient presents with  . Establish Care    no concerns just needing PCP as she just moved into the area. Was in nursing home in [place]    HPI Desiree Pollard is a 73 year old female who presents to [place] today.    There are no problems to display for this patient.   Past Medical History:  Diagnosis Date  . Breast cancer ([place])    2017  . CVA (cerebral vascular accident) ([place]) 11/2018  . Diabetes mellitus without complication ([place])   . Diabetic retinopathy ([place])    PDR OU  . Hypertensive retinopathy    OU  . Stroke [facility])      Current Status: This will be Ms. Ezekiel's initial office visit with me. She was previously seeing a Physician [place]CP needs. Since her last office visit, she is doing well with no complaints. She has recently relocated to [place] to live with her sister. She has been in [place] X 3 months now. She arrives today in wheelchair and her sister I present at visit today . Her most recent normal range of preprandial blood glucose levels have been between 120-160. She has seen low range of 115 and high of 168 since hhe last office visit. She denies fatigue, frequent urination, blurred vision, excessive hunger, excessive thirst, weight gain, weight loss, and poor wound healing. She continues to check her feet regularly. She denies visual changes, chest pain, cough, shortness of breath, heart palpitations, and falls. She has occasional headaches and dizziness with position changes. Denies severe headaches, confusion, seizures, double vision, and blurred vision, nausea and vomiting. She does report occasionally diarrhea. She denies fevers, chills,  recentinfections, weight loss, and night sweats. No reports of GI problems  such as nausea, vomiting, and constipation She has no reports of blood in stools, dysuria and hematuria. No depression or anxiety reported today. She denies suicidal ideations, homicidal ideations, or auditory hallucinations. She is taking all medications as prescribed. She denies pain today.    Past Surgical History:  Procedure Laterality Date  . CATARACT EXTRACTION    . EYE SURGERY    . MASTECTOMY Bilateral    "2017" Left Mastectomy; "2019" Right Mastectomy  . PACEMAKER IMPLANT  2017    Family History  Problem Relation Age of Onset  . Cancer Mother   . Heart disease Father   . Diabetes Sister   . Diabetes Brother     Social History   Socioeconomic History  . Marital status: Legally Separated    Spouse name: Not on file  . Number of children: 1  . Years of education: Not on file  . Highest education level: Not on file  Occupational History  . Not on file  Tobacco Use  . Smoking status: Former Research scientist (life sciences)  . Smokeless tobacco: Never Used  Vaping Use  . Vaping Use: Never used  Substance and Sexual Activity  . Alcohol use: Not Currently  . Drug use: Never  . Sexual activity: Not Currently  Other Topics Concern  . Not on file  Social History Narrative   Lives with Sister   Social Determinants of Health   Financial Resource Strain:   . Difficulty of Paying Living Expenses:   Food Insecurity:   .  Worried About Charity fundraiser in the Last Year:   . Arboriculturist in the Last Year:   Transportation Needs:   . Film/video editor (Medical):   Marland Kitchen Lack of Transportation (Non-Medical):   Physical Activity:   . Days of Exercise per Week:   . Minutes of Exercise per Session:   Stress:   . Feeling of Stress :   Social Connections:   . Frequency of Communication with Friends and Family:   . Frequency of Social Gatherings with Friends and Family:   . Attends Religious Services:   . Active Member of Clubs or Organizations:   . Attends Archivist Meetings:    Marland Kitchen Marital Status:   Intimate Partner Violence:   . Fear of Current or Ex-Partner:   . Emotionally Abused:   Marland Kitchen Physically Abused:   . Sexually Abused:     Outpatient Medications Prior to Visit  Medication Sig Dispense Refill  . Accu-Chek FastClix Lancets MISC USE AS DIRECTED TO TEST BLOOD GLUCOSE DAILY    . aspirin EC 81 MG tablet Take by mouth.    Marland Kitchen atorvastatin (LIPITOR) 80 MG tablet Take by mouth.    . Blood Glucose Monitoring Suppl (FIFTY50 GLUCOSE METER 2.0) w/Device KIT Use to check blood sugars. Freestyle glucose meter. Okay to substitute for other meter preferred by insurance. ICD-10 E11.9.    . brimonidine (ALPHAGAN) 0.2 % ophthalmic solution Place 1 drop into both eyes 2 (two) times daily. 10 mL 6  . carvedilol (COREG) 25 MG tablet Take by mouth.    . donepezil (ARICEPT) 10 MG tablet Take 10 mg by mouth at bedtime.    . dorzolamide-timolol (COSOPT) 22.3-6.8 MG/ML ophthalmic solution INSTILL 1 DROP INTO BOTH EYES TWICE A DAY 10 mL 3  . KLOR-CON M20 20 MEQ tablet Take 20 mEq by mouth daily.    Marland Kitchen lisinopril (PRINIVIL,ZESTRIL) 20 MG tablet Take 10 mg by mouth daily.     Marland Kitchen loperamide (IMODIUM) 2 MG capsule Take by mouth.    . enalapril (VASOTEC) 5 MG tablet Take 5 mg by mouth daily.    . insulin lispro (HUMALOG) 100 UNIT/ML injection Inject into the skin. (Patient not taking: Reported on 06/10/2020)    . insulin NPH-regular Human (70-30) 100 UNIT/ML injection Inject into the skin.    . Insulin Pen Needle (FIFTY50 PEN NEEDLES) 32G X 4 MM MISC Needles for insulin pen. Inject as instructed E11.9    . meclizine (ANTIVERT) 25 MG tablet Take 1 tablet (25 mg total) by mouth 3 (three) times daily as needed for dizziness. (Patient not taking: Reported on 06/10/2020) 30 tablet 0  . NIFEdipine (PROCARDIA XL/NIFEDICAL XL) 60 MG 24 hr tablet Take 60 mg by mouth daily. (Patient not taking: Reported on 06/10/2020)    . ondansetron (ZOFRAN) 4 MG tablet Take 4 mg by mouth every 6 (six) hours as needed.  (Patient not taking: Reported on 06/10/2020)    . cefTRIAXone (ROCEPHIN) 1 g injection     . cephALEXin (KEFLEX) 500 MG capsule      Facility-Administered Medications Prior to Visit  Medication Dose Route Frequency Provider Last Rate Last Admin  . triamcinolone acetonide (KENALOG-40) injection 4 mg  4 mg Intravitreal  Bernarda Caffey, MD   4 mg at 02/07/19 1653  . triamcinolone acetonide (KENALOG-40) injection 4 mg  4 mg Intravitreal  Bernarda Caffey, MD   4 mg at 02/07/19 1659    Allergies  Allergen Reactions  . Amlodipine  Nausea And Vomiting    Pt refuses to take due to severe n/v that began after starting amlodipine and stopped after discontinuation.    ROS Review of Systems  Constitutional: Negative.   HENT: Negative.   Eyes: Negative.   Respiratory: Negative.   Cardiovascular: Negative.   Gastrointestinal: Negative.   Endocrine: Negative.   Genitourinary: Negative.   Musculoskeletal: Negative.   Skin: Negative.   Allergic/Immunologic: Negative.   Neurological: Positive for dizziness (occasional ) and headaches (occasional ).  Hematological: Negative.   Psychiatric/Behavioral: Negative.       Objective:    Physical Exam Vitals and nursing note reviewed. Exam conducted with a chaperone present (sister).  Constitutional:      Appearance: Normal appearance.  HENT:     Head: Normocephalic and atraumatic.     Nose: Nose normal.     Mouth/Throat:     Mouth: Mucous membranes are moist.  Cardiovascular:     Rate and Rhythm: Normal rate and regular rhythm.     Pulses: Normal pulses.     Heart sounds: Normal heart sounds.  Pulmonary:     Effort: Pulmonary effort is normal.     Breath sounds: Normal breath sounds.  Abdominal:     General: Bowel sounds are normal.     Palpations: Abdomen is soft.  Musculoskeletal:        General: Normal range of motion.     Cervical back: Normal range of motion and neck supple.  Skin:    General: Skin is warm and dry.  Neurological:      General: No focal deficit present.     Mental Status: She is alert and oriented to person, place, and time.  Psychiatric:        Mood and Affect: Mood normal.        Behavior: Behavior normal.        Thought Content: Thought content normal.        Judgment: Judgment normal.     BP (!) 132/54 (BP Location: Left Arm, Patient Position: Sitting, Cuff Size: Small)   Pulse (!) 59   Temp (!) 97.3 F (36.3 C)   Ht _0  (1.702 m)   Wt 137 lb (62.1 kg)   SpO2 100%   BMI 21.46 kg/m  Wt Readings from Last 3 Encounters:  06/10/20 137 lb (62.1 kg)  12/30/18 128 lb (58.1 kg)     Health Maintenance Due  Topic Date Due  . Hepatitis C Screening  Never done  . COVID-19 Vaccine (1) Never done  . DEXA SCAN  Never done    There are no preventive care reminders to display for this patient.  Lab Results  Component Value Date   TSH 1.650 06/10/2020   Lab Results  Component Value Date   WBC 7.2 06/10/2020   HGB 9.7 (L) 06/10/2020   HCT 29.2 (L) 06/10/2020   MCV 85 06/10/2020   PLT 193 06/10/2020   Lab Results  Component Value Date   NA 140 06/10/2020   K 5.8 (H) 06/10/2020   CO2 17 (L) 06/10/2020   GLUCOSE 106 (H) 06/10/2020   BUN 27 06/10/2020   CREATININE 2.66 (H) 06/10/2020   BILITOT 0.3 06/10/2020   ALKPHOS 114 06/10/2020   AST 11 06/10/2020   ALT 9 06/10/2020   PROT 7.3 06/10/2020   ALBUMIN 3.7 06/10/2020   CALCIUM 9.8 06/10/2020   ANIONGAP 11 12/30/2018   Lab Results  Component Value Date   CHOL 137 06/10/2020  Lab Results  Component Value Date   HDL 47 06/10/2020   Lab Results  Component Value Date   LDLCALC 70 06/10/2020   Lab Results  Component Value Date   TRIG 110 06/10/2020   Lab Results  Component Value Date   CHOLHDL 2.9 06/10/2020   Lab Results  Component Value Date   HGBA1C 6.2 (A) 06/10/2020   HGBA1C 6.2 06/10/2020   HGBA1C 6.2 06/10/2020   HGBA1C 6.2 06/10/2020      Assessment & Plan:   1. Encounter to establish care  2. Type  2 diabetes mellitus with hyperglycemia, with long-term current use of insulin (HCC) She will continue medication as prescribed, to decrease foods/beverages high in sugars and carbs and follow Heart Healthy or DASH diet. Increase physical activity to at least 30 minutes cardio exercise daily.  - CBC with Differential - Comprehensive metabolic panel - Lipid Panel - Vitamin B12 - Vitamin D, 25-hydroxy - TSH  3. Hemoglobin A1c less than 7.0% Stable at 6.2 today. Monitor.   4. Hyperglycemia  5. Hypertension, unspecified type She denies visual changes, chest pain, cough, shortness of breath, heart palpitations, and falls. She has occasional headaches and dizziness with position changes. Denies severe headaches, confusion, seizures, double vision, and blurred vision, nausea and vomiting. She will continue to take medications as prescribed, to decrease high sodium intake, excessive alcohol intake, increase potassium intake, smoking cessation, and increase physical activity of at least 30 minutes of cardio activity daily. She will continue to follow Heart Healthy or DASH diet.  6. History of stroke No signs or symptoms of recurrence noted or reported today.   7. Healthcare maintenance - Urinalysis Dipstick - Glucose (CBG) - HgB A1c  8. Follow up She will follow up in 3 months.   No orders of the defined types were placed in this encounter.   Orders Placed This Encounter  Procedures  . CBC with Differential  . Comprehensive metabolic panel  . Lipid Panel  . Vitamin B12  . Vitamin D, 25-hydroxy  . TSH  . Urinalysis Dipstick  . Glucose (CBG)  . HgB A1c    Referral Orders  No referral(s) requested today    Kathe Becton,  MSN, FNP-BC [address] [place], [place] [address] [phone]- fax  Problem List Items Addressed This Visit    None    Visit Diagnoses     Encounter to establish care    -  Primary   Type 2 diabetes mellitus with hyperglycemia, with long-term current use of insulin ([place])       Relevant Orders   CBC with Differential (Completed)   Comprehensive metabolic panel (Completed)   Lipid Panel (Completed)   Vitamin B12 (Completed)   Vitamin D, 25-hydroxy (Completed)   TSH (Completed)   Hemoglobin A1c less than 7.0%       Hyperglycemia       Hypertension, unspecified type       Relevant Medications   NIFEdipine (PROCARDIA XL/NIFEDICAL XL) 60 MG 24 hr tablet   History of stroke       Healthcare maintenance       Relevant Orders   Urinalysis Dipstick   Glucose (CBG) (Completed)   HgB A1c (Completed)   Follow up          No orders of the defined types were placed in this encounter.   Follow-up: Return in about 3 months (around 09/10/2020).  [name], FNP

## 2020-06-11 LAB — COMPREHENSIVE METABOLIC PANEL
ALT: 9 IU/L (ref 0–32)
AST: 11 IU/L (ref 0–40)
Albumin/Globulin Ratio: 1 — ABNORMAL LOW (ref 1.2–2.2)
Albumin: 3.7 g/dL (ref 3.7–4.7)
Alkaline Phosphatase: 114 IU/L (ref 48–121)
BUN/Creatinine Ratio: 10 — ABNORMAL LOW (ref 12–28)
BUN: 27 mg/dL (ref 8–27)
Bilirubin Total: 0.3 mg/dL (ref 0.0–1.2)
CO2: 17 mmol/L — ABNORMAL LOW (ref 20–29)
Calcium: 9.8 mg/dL (ref 8.7–10.3)
Chloride: 113 mmol/L — ABNORMAL HIGH (ref 96–106)
Creatinine, Ser: 2.66 mg/dL — ABNORMAL HIGH (ref 0.57–1.00)
GFR calc Af Amer: 19 mL/min/{1.73_m2} — ABNORMAL LOW (ref >59)
GFR calc non Af Amer: 17 mL/min/{1.73_m2} — ABNORMAL LOW (ref >59)
Globulin, Total: 3.6 g/dL (ref 1.5–4.5)
Glucose: 106 mg/dL — ABNORMAL HIGH (ref 65–99)
Potassium: 5.8 mmol/L — ABNORMAL HIGH (ref 3.5–5.2)
Sodium: 140 mmol/L (ref 134–144)
Total Protein: 7.3 g/dL (ref 6.0–8.5)

## 2020-06-11 LAB — CBC WITH DIFFERENTIAL/PLATELET
Basophils Absolute: 0.1 10*3/uL (ref 0.0–0.2)
Basos: 1 %
EOS (ABSOLUTE): 0.2 10*3/uL (ref 0.0–0.4)
Eos: 3 %
Hematocrit: 29.2 % — ABNORMAL LOW (ref 34.0–46.6)
Hemoglobin: 9.7 g/dL — ABNORMAL LOW (ref 11.1–15.9)
Immature Grans (Abs): 0 10*3/uL (ref 0.0–0.1)
Immature Granulocytes: 0 %
Lymphocytes Absolute: 1.4 10*3/uL (ref 0.7–3.1)
Lymphs: 20 %
MCH: 28.2 pg (ref 26.6–33.0)
MCHC: 33.2 g/dL (ref 31.5–35.7)
MCV: 85 fL (ref 79–97)
Monocytes Absolute: 0.5 10*3/uL (ref 0.1–0.9)
Monocytes: 6 %
Neutrophils Absolute: 5.1 10*3/uL (ref 1.4–7.0)
Neutrophils: 70 %
Platelets: 193 10*3/uL (ref 150–450)
RBC: 3.44 x10E6/uL — ABNORMAL LOW (ref 3.77–5.28)
RDW: 13.3 % (ref 11.7–15.4)
WBC: 7.2 10*3/uL (ref 3.4–10.8)

## 2020-06-11 LAB — VITAMIN B12: Vitamin B-12: 405 pg/mL (ref 232–1245)

## 2020-06-11 LAB — LIPID PANEL
Chol/HDL Ratio: 2.9 ratio (ref 0.0–4.4)
Cholesterol, Total: 137 mg/dL (ref 100–199)
HDL: 47 mg/dL (ref >39)
LDL Chol Calc (NIH): 70 mg/dL (ref 0–99)
Triglycerides: 110 mg/dL (ref 0–149)
VLDL Cholesterol Cal: 20 mg/dL (ref 5–40)

## 2020-06-11 LAB — TSH: TSH: 1.65 u[IU]/mL (ref 0.450–4.500)

## 2020-06-11 LAB — VITAMIN D 25 HYDROXY (VIT D DEFICIENCY, FRACTURES): Vit D, 25-Hydroxy: 23.7 ng/mL — ABNORMAL LOW (ref 30.0–100.0)

## 2020-06-16 DIAGNOSIS — R7309 Other abnormal glucose: Secondary | ICD-10-CM | POA: Insufficient documentation

## 2020-06-16 DIAGNOSIS — Z8673 Personal history of transient ischemic attack (TIA), and cerebral infarction without residual deficits: Secondary | ICD-10-CM | POA: Insufficient documentation

## 2020-06-16 DIAGNOSIS — I10 Essential (primary) hypertension: Secondary | ICD-10-CM | POA: Insufficient documentation

## 2020-06-16 DIAGNOSIS — R739 Hyperglycemia, unspecified: Secondary | ICD-10-CM | POA: Insufficient documentation

## 2020-06-16 DIAGNOSIS — Z794 Long term (current) use of insulin: Secondary | ICD-10-CM | POA: Insufficient documentation

## 2020-06-16 DIAGNOSIS — E119 Type 2 diabetes mellitus without complications: Secondary | ICD-10-CM | POA: Insufficient documentation

## 2020-06-28 ENCOUNTER — Other Ambulatory Visit: Payer: Self-pay | Admitting: Family Medicine

## 2020-06-28 DIAGNOSIS — R7989 Other specified abnormal findings of blood chemistry: Secondary | ICD-10-CM

## 2020-06-28 DIAGNOSIS — R197 Diarrhea, unspecified: Secondary | ICD-10-CM

## 2020-06-28 MED ORDER — LOPERAMIDE HCL 2 MG PO TABS: 2.0000 mg | ORAL_TABLET | Freq: Four times a day (QID) | ORAL | 2 refills | Status: DC | PRN

## 2020-07-04 NOTE — Progress Notes (Addendum)
[facility] - Clinic Note  07/09/2020     CHIEF COMPLAINT Patient presents for Retina Follow Up   HISTORY OF PRESENT ILLNESS: Desiree Pollard is a 74 y.o. female who presents to the clinic today for:   HPI    Retina Follow Up    Patient presents with  Diabetic Retinopathy.  In both eyes.  Duration of 7 months.  Since onset it is gradually worsening.  I, the attending physician,  performed the HPI with the patient and updated documentation appropriately.          Comments    Retina follow up for PDR. Over due. Patients states vision is not doing good. BS 122, A1C6... Patient had a stroke in Jan.2021.        Last edited by , , MD on 07/10/2020  9:26 AM. (History)    Delayed f/u from 11/2019   Referring physician: No referring provider defined for this encounter.  HISTORICAL INFORMATION:   Selected notes from the medical record:  Referred by Dr. Alan Hager for DM exam LEE: 02.04.20 (A. Hager) [BCVA: OD: 20/400 OS: 20/150] Ocular Hx-ptosis OU, pseudo OU, diabetic ret OU PMH-hx of stroke (1 month), cancer, DM    CURRENT MEDICATIONS: Current Outpatient Medications (Ophthalmic Drugs)  Medication Sig  . brimonidine (ALPHAGAN) 0.2 % ophthalmic solution Place 1 drop into both eyes 2 (two) times daily.  . dorzolamide-timolol (COSOPT) 22.3-6.8 MG/ML ophthalmic solution INSTILL 1 DROP INTO BOTH EYES TWICE A DAY   No current facility-administered medications for this visit. (Ophthalmic Drugs)   Current Outpatient Medications (Other)  Medication Sig  . Accu-Chek FastClix Lancets MISC USE AS DIRECTED TO TEST BLOOD GLUCOSE DAILY  . aspirin EC 81 MG tablet Take by mouth.  . atorvastatin (LIPITOR) 80 MG tablet Take by mouth.  . Blood Glucose Monitoring Suppl (FIFTY50 GLUCOSE METER 2.0) w/Device KIT Use to check blood sugars. Freestyle glucose meter. Okay to substitute for other meter preferred by insurance. ICD-10 E11.9.  . carvedilol (COREG) 25 MG  tablet Take by mouth.  . donepezil (ARICEPT) 10 MG tablet Take 10 mg by mouth at bedtime.  . enalapril (VASOTEC) 5 MG tablet Take 5 mg by mouth daily.  . insulin lispro (HUMALOG) 100 UNIT/ML injection Inject into the skin.   . insulin NPH-regular Human (70-30) 100 UNIT/ML injection Inject into the skin.  . Insulin Pen Needle (FIFTY50 PEN NEEDLES) 32G X 4 MM MISC Needles for insulin pen. Inject as instructed E11.9  . KLOR-CON M20 20 MEQ tablet Take 20 mEq by mouth daily.  . lisinopril (PRINIVIL,ZESTRIL) 20 MG tablet Take 10 mg by mouth daily.   . loperamide (IMODIUM A-D) 2 MG tablet Take 1 tablet (2 mg total) by mouth 4 (four) times daily as needed for diarrhea or loose stools.  . meclizine (ANTIVERT) 25 MG tablet Take 1 tablet (25 mg total) by mouth 3 (three) times daily as needed for dizziness.  . NIFEdipine (PROCARDIA XL/NIFEDICAL XL) 60 MG 24 hr tablet Take 60 mg by mouth daily.   . ondansetron (ZOFRAN) 4 MG tablet Take 4 mg by mouth every 6 (six) hours as needed.    Current Facility-Administered Medications (Other)  Medication Route  . triamcinolone acetonide (KENALOG-40) injection 4 mg Intravitreal  . triamcinolone acetonide (KENALOG-40) injection 4 mg Intravitreal      REVIEW OF SYSTEMS: ROS    Positive for: Musculoskeletal, Endocrine, Cardiovascular, Eyes   Negative for: Constitutional, Gastrointestinal, Neurological, Skin, Genitourinary, HENT, Respiratory, Psychiatric, Allergic/Imm,   Heme/Lymph   Last edited by Atkins, Lisa E, COT on 07/09/2020  2:29 PM. (History)       ALLERGIES Allergies  Allergen Reactions  . Amlodipine Nausea And Vomiting    Pt refuses to take due to severe n/v that began after starting amlodipine and stopped after discontinuation.    PAST MEDICAL HISTORY Past Medical History:  Diagnosis Date  . Breast cancer (HCC)    2017  . CVA (cerebral vascular accident) (HCC) 11/2018  . Diabetes mellitus without complication (HCC)   . Diabetic retinopathy  (HCC)    PDR OU  . Hypertensive retinopathy    OU  . Stroke (HCC)    Past Surgical History:  Procedure Laterality Date  . CATARACT EXTRACTION    . EYE SURGERY    . MASTECTOMY Bilateral    "2017" Left Mastectomy; "2019" Right Mastectomy  . PACEMAKER IMPLANT  2017    FAMILY HISTORY Family History  Problem Relation Age of Onset  . Cancer Mother   . Heart disease Father   . Diabetes Sister   . Diabetes Brother     SOCIAL HISTORY Social History   Tobacco Use  . Smoking status: Former Smoker  . Smokeless tobacco: Never Used  Vaping Use  . Vaping Use: Never used  Substance Use Topics  . Alcohol use: Not Currently  . Drug use: Never         OPHTHALMIC EXAM:  Base Eye Exam    Visual Acuity (Snellen - Linear)      Right Left   Dist [place] CF @ face 20/150-2   Dist ph [place] NI 20/80-2       Tonometry (Tonopen, 2:40 PM)      Right Left   Pressure 36 13  1 drop brimonidine and cosopt given OD per Dr.   IOP 27 OD when re-checked after gtts at 3:54 p.m.       Pupils      Dark Light Shape React APD   Right 3 2 Round Slow None   Left 3 2 Round Slow None       Visual Fields (Counting fingers)      Left Right    Full    Restrictions  Total superior temporal, inferior temporal, superior nasal, inferior nasal deficiencies       Extraocular Movement      Right Left    Full, Ortho Full, Ortho       Neuro/Psych    Oriented x3: Yes   Mood/Affect: Normal       Dilation    Both eyes: 1.0% Mydriacyl, 2.5% Phenylephrine @ 3:02 PM        Slit Lamp and Fundus Exam    Slit Lamp Exam      Right Left   Lids/Lashes Dermatochalasis - upper lid, nasal Ectropion - lower lid Dermatochalasis - upper lid, Meibomian gland dysfunction, mild, nasal Ectropion, lower lid   Conjunctiva/Sclera nasal and temporal Pinguecula, Melanosis nasal and temporal Pinguecula, Melanosis   Cornea mild Arcus, Well healed cataract wounds, tear film debris, 2+ PEE mild Arcus, Well healed  cataract wounds, 2+ Punctate epithelial erosions, tear film debris   Anterior Chamber deep and clear deep and clear   Iris Round and moderately dilated to 5 mm Round and moderately dilated to 5mm, No NVI   Lens Posterior chamber intraocular lens, trace Posterior capsular opacification Posterior chamber intraocular lens, trace Posterior capsular opacification   Vitreous Vitreous syneresis, mild residual white VH inferiorly Vitreous syneresis         Fundus Exam      Right Left   Disc 3-4+ pallor, sharp rim, +atrophy Pink and sharp, fine NVD - regressing, sharp rim   C/D Ratio 0.6 0.1   Macula Blunted foveal reflex, scattered MA, +ERM, central edema--improved, atrophic and ischemic Blunted foveal reflex, large subhyaloid boat-shaped heme obscuring inferior half of macula, +edema, scattered IRH   Vessels Vascular attenuation, Tortuous, +fibrosis along ST arcades--improved severe Vascular attenuation, Tortuous, +NV   Periphery Attached, scattered IRH/MA, good 360 PRP fill in Attached, scattered IRH and pre-retinal heme, 360 PRP w/room for fill-in posteriorly        Refraction    Manifest Refraction   Attempted refraction OU...unable. Dark reflexes OU          IMAGING AND PROCEDURES  Imaging and Procedures for @TODAY@  OCT, Retina - OU - Both Eyes       Right Eye Quality was good. Central Foveal Thickness: 315. Progression has improved. Findings include abnormal foveal contour, intraretinal fluid, intraretinal hyper-reflective material, no SRF, outer retinal atrophy, epiretinal membrane, macular pucker (Interval improvement in central edema).   Left Eye Quality was good. Central Foveal Thickness: 770. Progression has worsened. Findings include abnormal foveal contour, intraretinal fluid, epiretinal membrane, no SRF, preretinal fibrosis, vitreous traction (Persistent central edema; interval development of massive subhyaloid heme inferior macula).   Notes *Images captured and stored on  drive  Diagnosis / Impression:  DME OU Mild ERM OU OD: Interval improvement in central edema OS: Persistent central edema; interval development of massive subhyaloid heme inferior macula   Clinical management:  See below  Abbreviations: NFP - Normal foveal profile. CME - cystoid macular edema. PED - pigment epithelial detachment. IRF - intraretinal fluid. SRF - subretinal fluid. EZ - ellipsoid zone. ERM - epiretinal membrane. ORA - outer retinal atrophy. ORT - outer retinal tubulation. SRHM - subretinal hyper-reflective material        Intravitreal Injection, Pharmacologic Agent - OS - Left Eye       Time Out 07/09/2020. 4:08 PM. Confirmed correct patient, procedure, site, and patient consented.   Anesthesia Topical anesthesia was used. Anesthetic medications included Lidocaine 2%, Proparacaine 0.5%.   Procedure Preparation included 5% betadine to ocular surface, eyelid speculum. A supplied needle was used.   Injection:  1.25 mg Bevacizumab (AVASTIN) SOLN   NDC: 50242-060-01, Lot: [id number], Expiration date: 08/07/2020   Route: Intravitreal, Site: Left Eye, Waste: 0 mL  Post-op Post injection exam found visual acuity of at least counting fingers. The patient tolerated the procedure well. There were no complications. The patient received written and verbal post procedure care education.                 ASSESSMENT/PLAN:    ICD-10-CM   1. Proliferative diabetic retinopathy of both eyes with macular edema associated with type 2 diabetes mellitus (HCC)  E11.3513 Intravitreal Injection, Pharmacologic Agent - OS - Left Eye    Bevacizumab (AVASTIN) SOLN 1.25 mg  2. 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 Essential hypertension  I10   5. Hypertensive retinopathy of both eyes  H35.033   6. Pseudophakia of both eyes  Z96.1   7. History of stroke  Z86.73   8. Ocular hypertension, bilateral  H40.053     1,2.  Proliferative diabetic retinopathy w/ DME OU  - lost to f/u from 11/2019 to now (06/2020) due to living out of the area   in [place] and unable to return  - lost to f/u 3.23.2020 to 7.13.2020 due to Nursing Home COVID restrictions  - history of VH OD onset 2.25.20 -- improved  - at initial presentation - difficult exam due to poor patient cooperation -- history of stroke -- improved now  - initial exam showed central edema OU and scattered IRH; +fine NVD OS; early flat NVE OD  - FA (08.19.20) shows NV nasal to disc OD; OS with NV along SN and ST arcades  - s/p PRP OS (02.18.20), fill-in (08.19.20)  - s/p PRP OD (03.23.30), fill-in (09.16.20)  - s/p IVTA OU #1 (02.25.20), #2 (03.23.20) -- lost to f/u due to COVID restrictions  - s/p IVA OU #1 (07.31.20), #2 (09.02.20), #3 (10.07.20), #4 (11.04.20)             - s/p IVE OU #1 (12.2.20)  - IOP 36 OD, 13 OS today -- pt has been out of drops for months  - restart Cosopt TID OD, brimonidine TID OD  - BCVA: Down to CF @ face from 20/60 OD, stable at 20/80 OS -- suspect glaucomatous vision loss (OD disc severe pallor)  - OCT shows interval improvement in central edema OD, OS: Persistent central edema; interval development of massive subhyaloid heme inferior macula  - recommend IVA OS #5 today, 7.27.21  - pt wishes to proceed  - RBA of procedure discussed, questions answered  - informed consent obtained and signed  - see procedure note  - [employer] benefits investigation started 11.4.20 -- approved as of 12.02.20; 2021 approval pending  - f/u 4 weeks, DFE, OCT, possible injection  3. VMT OS  - stable VMT, contributing to central edema OS  - monitor  4,5. Hypertensive retinopathy OU  - discussed importance of tight BP control and its relation to 1,2 above  - monitor  6. Pseudophakia OU  - s/p CE/IOL OU  - doing well  - monitor  7. History of stroke  - ischemic stroke on 12/26/2018  - pt reports some visual decline following stroke  8.  Ocular Hypertension OU  - steroid response  - IOP elevated to 36 OD today, 7.27.21 -- improved to 27 post Cosopt and brim  - disc OD pale/atrophic and BCVA down to CF  - suspect glaucomatous vision loss since last visit in 2020 -- pt has not been on any pressure lowering drops  - restart Cosopt and brimonidine TID OD   Ophthalmic Meds Ordered this visit:  Meds ordered this encounter  Medications  . Bevacizumab (AVASTIN) SOLN 1.25 mg      Return in about 4 weeks (around 08/06/2020) for 4 wk f/u for PDR OU w/DFE&OCT.  There are no Patient Instructions on file for this visit.   Explained the diagnoses, plan, and follow up with the patient and they expressed understanding.  Patient expressed understanding of the importance of proper follow up care.   This document serves as a record of services personally performed by  G. , MD, PhD. It was created on their behalf by Andrew Baxley, COT an ophthalmic technician. The creation of this record is the provider's dictation and/or activities during the visit.    Electronically signed by: Andrew Baxley, COT 07/04/20 @ 9:29 AM   This document serves as a record of services personally performed by  G. , MD, PhD. It was created on their behalf by Andrew Baxley, COT an ophthalmic technician. The creation of this record is the provider's dictation and/or activities   during the visit.    Electronically signed by: Estill Bakes, COT 7.27.21 @ 9:29 AM  Gardiner Sleeper, M.D., Ph.D. Diseases & Surgery of the Retina and [place] 07/09/2020    I have reviewed the above documentation for accuracy and completeness, and I agree with the above. Gardiner Sleeper, M.D., Ph.D. 07/10/20 9:29 AM    Abbreviations: M myopia (nearsighted); A astigmatism; H hyperopia (farsighted); P presbyopia; Mrx spectacle prescription;  CTL contact lenses; OD right eye; OS left eye; OU both eyes  XT exotropia; ET esotropia;  PEK punctate epithelial keratitis; PEE punctate epithelial erosions; DES dry eye syndrome; MGD meibomian gland dysfunction; ATs artificial tears; PFAT's preservative free artificial tears; [place] nuclear sclerotic cataract; PSC posterior subcapsular cataract; ERM epi-retinal membrane; PVD posterior vitreous detachment; RD retinal detachment; DM diabetes mellitus; DR diabetic retinopathy; NPDR non-proliferative diabetic retinopathy; PDR proliferative diabetic retinopathy; CSME clinically significant macular edema; DME diabetic macular edema; dbh dot blot hemorrhages; CWS cotton wool spot; POAG primary open angle glaucoma; C/D cup-to-disc ratio; HVF humphrey visual field; GVF goldmann visual field; OCT optical coherence tomography; IOP intraocular pressure; BRVO Branch retinal vein occlusion; CRVO central retinal vein occlusion; CRAO central retinal artery occlusion; BRAO branch retinal artery occlusion; RT retinal tear; SB scleral buckle; PPV pars plana vitrectomy; VH Vitreous hemorrhage; PRP panretinal laser photocoagulation; IVK intravitreal kenalog; VMT vitreomacular traction; MH Macular hole;  NVD neovascularization of the disc; NVE neovascularization elsewhere; AREDS age related eye disease study; ARMD age related macular degeneration; POAG primary open angle glaucoma; EBMD epithelial/anterior basement membrane dystrophy; ACIOL anterior chamber intraocular lens; IOL intraocular lens; PCIOL posterior chamber intraocular lens; Phaco/IOL phacoemulsification with intraocular lens placement; [place] photorefractive keratectomy; LASIK laser assisted in situ keratomileusis; HTN hypertension; DM diabetes mellitus; COPD chronic obstructive pulmonary disease

## 2020-07-09 ENCOUNTER — Encounter (INDEPENDENT_AMBULATORY_CARE_PROVIDER_SITE_OTHER): Payer: Self-pay | Admitting: Ophthalmology

## 2020-07-09 ENCOUNTER — Other Ambulatory Visit: Payer: Self-pay

## 2020-07-09 ENCOUNTER — Ambulatory Visit (INDEPENDENT_AMBULATORY_CARE_PROVIDER_SITE_OTHER): Payer: Medicare HMO | Admitting: Ophthalmology

## 2020-07-09 DIAGNOSIS — I10 Essential (primary) hypertension: Secondary | ICD-10-CM | POA: Diagnosis not present

## 2020-07-09 DIAGNOSIS — Z961 Presence of intraocular lens: Secondary | ICD-10-CM

## 2020-07-09 DIAGNOSIS — H35033 Hypertensive retinopathy, bilateral: Secondary | ICD-10-CM

## 2020-07-09 DIAGNOSIS — H40053 Ocular hypertension, bilateral: Secondary | ICD-10-CM

## 2020-07-09 DIAGNOSIS — E113513 Type 2 diabetes mellitus with proliferative diabetic retinopathy with macular edema, bilateral: Secondary | ICD-10-CM | POA: Diagnosis not present

## 2020-07-09 DIAGNOSIS — H3581 Retinal edema: Secondary | ICD-10-CM | POA: Diagnosis not present

## 2020-07-09 DIAGNOSIS — H43822 Vitreomacular adhesion, left eye: Secondary | ICD-10-CM

## 2020-07-09 DIAGNOSIS — Z8673 Personal history of transient ischemic attack (TIA), and cerebral infarction without residual deficits: Secondary | ICD-10-CM

## 2020-07-10 ENCOUNTER — Encounter (INDEPENDENT_AMBULATORY_CARE_PROVIDER_SITE_OTHER): Payer: Self-pay | Admitting: Ophthalmology

## 2020-07-10 MED ORDER — BEVACIZUMAB CHEMO INJECTION 1.25MG/0.05ML SYRINGE FOR KALEIDOSCOPE
1.2500 mg | INTRAVITREAL | Status: AC | PRN
  Administered 2020-07-10: 1.25 mg via INTRAVITREAL

## 2020-07-14 DIAGNOSIS — E875 Hyperkalemia: Secondary | ICD-10-CM

## 2020-07-14 DIAGNOSIS — R197 Diarrhea, unspecified: Secondary | ICD-10-CM

## 2020-07-14 DIAGNOSIS — E860 Dehydration: Secondary | ICD-10-CM

## 2020-07-14 HISTORY — DX: Dehydration: E86.0

## 2020-07-14 HISTORY — DX: Diarrhea, unspecified: R19.7

## 2020-07-14 HISTORY — DX: Hyperkalemia: E87.5

## 2020-08-01 NOTE — Progress Notes (Signed)
[facility] Note  08/06/2020     CHIEF COMPLAINT Patient presents for Retina Follow Up   HISTORY OF PRESENT ILLNESS: Desiree Pollard is a 74 y.o. female who presents to the clinic today for:   HPI    Retina Follow Up    Patient presents with  Diabetic Retinopathy.  In both eyes.  Duration of 4 weeks.  Since onset it is stable.  I, the attending physician,  performed the HPI with the patient and updated documentation appropriately.          Comments    4 week follow up PDR OU- Unsure if any changes with eyes OU.  Using Cosopt TID and Brimonidine TID OD.  OS uses a green top drop TID. BS: 117 yesterday  A1C: 6.4       Last edited by Bernarda Caffey, MD on 08/06/2020 12:33 PM. (History)      Referring physician: Phylliss Blakes, [place],  [place] [address]  HISTORICAL INFORMATION:   Selected notes from the MEDICAL RECORD NUMBER Referred by Dr. Angelena Form for DM exam LEE: 02.04.20 (A. Hager) [BCVA: OD: 20/400 OS: 20/150] Ocular Hx-ptosis OU, pseudo OU, diabetic ret OU PMH-hx of stroke (1 month), cancer, DM    CURRENT MEDICATIONS: Current Outpatient Medications (Ophthalmic Drugs)  Medication Sig  . brimonidine (ALPHAGAN) 0.2 % ophthalmic solution Place 1 drop into both eyes 2 (two) times daily.  . dorzolamide-timolol (COSOPT) 22.3-6.8 MG/ML ophthalmic solution INSTILL 1 DROP INTO BOTH EYES TWICE A DAY  . latanoprost (XALATAN) 0.005 % ophthalmic solution Place 1 drop into the right eye at bedtime.   No current facility-administered medications for this visit. (Ophthalmic Drugs)   Current Outpatient Medications (Other)  Medication Sig  . Accu-Chek FastClix Lancets MISC USE AS DIRECTED TO TEST BLOOD GLUCOSE DAILY  . aspirin EC 81 MG tablet Take by mouth.  Marland Kitchen atorvastatin (LIPITOR) 80 MG tablet Take by mouth.  . Blood Glucose Monitoring Suppl (FIFTY50 GLUCOSE METER 2.0) w/Device KIT Use to check blood sugars. Freestyle glucose meter.  Okay to substitute for other meter preferred by insurance. ICD-10 E11.9.  Marland Kitchen carvedilol (COREG) 25 MG tablet Take by mouth.  . donepezil (ARICEPT) 10 MG tablet Take 10 mg by mouth at bedtime.  . enalapril (VASOTEC) 5 MG tablet Take 5 mg by mouth daily.  . insulin lispro (HUMALOG) 100 UNIT/ML injection Inject into the skin.   Marland Kitchen insulin NPH-regular Human (70-30) 100 UNIT/ML injection Inject into the skin.  . Insulin Pen Needle (FIFTY50 PEN NEEDLES) 32G X 4 MM MISC Needles for insulin pen. Inject as instructed E11.9  . KLOR-CON M20 20 MEQ tablet Take 20 mEq by mouth daily.  Marland Kitchen lisinopril (PRINIVIL,ZESTRIL) 20 MG tablet Take 10 mg by mouth daily.   Marland Kitchen loperamide (IMODIUM A-D) 2 MG tablet Take 1 tablet (2 mg total) by mouth 4 (four) times daily as needed for diarrhea or loose stools.  . meclizine (ANTIVERT) 25 MG tablet Take 1 tablet (25 mg total) by mouth 3 (three) times daily as needed for dizziness.  Marland Kitchen NIFEdipine (PROCARDIA XL/NIFEDICAL XL) 60 MG 24 hr tablet Take 60 mg by mouth daily.   . ondansetron (ZOFRAN) 4 MG tablet Take 4 mg by mouth every 6 (six) hours as needed.    Current Facility-Administered Medications (Other)  Medication Route  . triamcinolone acetonide (KENALOG-40) injection 4 mg Intravitreal  . triamcinolone acetonide (KENALOG-40) injection 4 mg Intravitreal   REVIEW  OF SYSTEMS: ROS    Positive for: Neurological, Musculoskeletal, Endocrine, Cardiovascular, Eyes, Heme/Lymph   Negative for: Constitutional, Gastrointestinal, Skin, Genitourinary, HENT, Respiratory, Psychiatric, Allergic/Imm   Last edited by Leonie Douglas, COA on 08/06/2020  9:15 AM. (History)     ALLERGIES Allergies  Allergen Reactions  . Amlodipine Nausea And Vomiting    Pt refuses to take due to severe n/v that began after starting amlodipine and stopped after discontinuation.   PAST MEDICAL HISTORY Past Medical History:  Diagnosis Date  . Breast cancer ([place])    2017  . CVA (cerebral vascular accident)  ([place]) 11/2018  . Diabetes mellitus without complication ([place])   . Diabetic retinopathy ([place])    PDR OU  . Hypertensive retinopathy    OU  . Stroke [facility])    Past Surgical History:  Procedure Laterality Date  . CATARACT EXTRACTION    . EYE SURGERY    . MASTECTOMY Bilateral    "2017" Left Mastectomy; "2019" Right Mastectomy  . PACEMAKER IMPLANT  2017   FAMILY HISTORY Family History  Problem Relation Age of Onset  . Cancer Mother   . Heart disease Father   . Diabetes Sister   . Diabetes Brother    SOCIAL HISTORY Social History   Tobacco Use  . Smoking status: Former Research scientist (life sciences)  . Smokeless tobacco: Never Used  Vaping Use  . Vaping Use: Never used  Substance Use Topics  . Alcohol use: Not Currently  . Drug use: Never         OPHTHALMIC EXAM:  Base Eye Exam    Visual Acuity (Snellen - Linear)      Right Left   Dist [place] CF inferotemp 20/150 +2   Dist ph   20/80 -2       Tonometry (Tonopen, 9:23 AM)      Right Left   Pressure 30 15  Instilled in office OD: 1 drop Brimonidine @9 :30 1 drop Dorz/Timolol @9 :35       Pupils      Dark Light Shape React APD   Right 2 1 Round Minimal None   Left 2 1 Round Minimal None       Visual Fields (Counting fingers)      Left Right    Full    Restrictions  Total superior temporal, superior nasal, inferior nasal deficiencies       Extraocular Movement      Right Left    Full Full       Neuro/Psych    Oriented x3: Yes   Mood/Affect: Normal       Dilation    Both eyes: 1.0% Mydriacyl, 2.5% Phenylephrine @ 9:27 AM        Slit Lamp and Fundus Exam    Slit Lamp Exam      Right Left   Lids/Lashes Dermatochalasis - upper lid, nasal Ectropion - lower lid Dermatochalasis - upper lid, Meibomian gland dysfunction, mild, nasal Ectropion, lower lid   Conjunctiva/Sclera nasal and temporal Pinguecula, Melanosis nasal and temporal Pinguecula, Melanosis   Cornea mild Arcus, Well healed cataract wounds, tear film debris, 2+  PEE mild Arcus, Well healed cataract wounds, 2+ Punctate epithelial erosions, tear film debris   Anterior Chamber deep and clear deep and clear   Iris Round and moderately dilated to 5 mm Round and moderately dilated to 55m, No NVI   Lens Posterior chamber intraocular lens, trace Posterior capsular opacification Posterior chamber intraocular lens, trace Posterior capsular opacification   Vitreous Vitreous syneresis, mild  residual white VH inferiorly Vitreous syneresis       Fundus Exam      Right Left   Disc 3-4+ pallor, sharp rim, +atrophy Pink and sharp, fine NVD - regressing, sharp rim   C/D Ratio 0.8 0.1   Macula Blunted foveal reflex, scattered MA, +ERM, central edema--improved, atrophic and ischemic Blunted foveal reflex, interval improvement in large subhyaloid boat-shaped heme obscuring inferior half of macula, interval improvement in edema, scattered [facility]   Vessels Vascular attenuation, Tortuous, +fibrosis along ST arcades--improved severe Vascular attenuation, Tortuous, +NV   Periphery Attached, scattered IRH/MA, good 360 PRP fill in Attached, scattered IRH and pre-retinal heme, 360 PRP w/room for fill-in posteriorly          IMAGING AND PROCEDURES  Imaging and Procedures for @TODAY @  OCT, Retina - OU - Both Eyes       Right Eye Quality was good. Central Foveal Thickness: 316. Progression has been stable. Findings include abnormal foveal contour, intraretinal fluid, intraretinal hyper-reflective material, no SRF, outer retinal atrophy, epiretinal membrane, macular pucker (Persistent central cystic changes).   Left Eye Quality was good. Central Foveal Thickness: 591. Progression has improved. Findings include abnormal foveal contour, intraretinal fluid, epiretinal membrane, no SRF, preretinal fibrosis, vitreous traction (Interval improvement in subhyaloid heme and IRF; persistent central vitreous traction).   Notes *Images captured and stored on drive  Diagnosis /  Impression:  DME OU Mild ERM OU OD: Persistent central cystic changes OS: Interval improvement in subhyaloid heme and IRF; persistent central vitreous traction   Clinical management:  See below  Abbreviations: NFP - Normal foveal profile. CME - cystoid macular edema. PED - pigment epithelial detachment. IRF - intraretinal fluid. SRF - subretinal fluid. EZ - ellipsoid zone. ERM - epiretinal membrane. ORA - outer retinal atrophy. ORT - outer retinal tubulation. SRHM - subretinal hyper-reflective material        Intravitreal Injection, Pharmacologic Agent - OS - Left Eye       Time Out 08/06/2020. 10:34 AM. Confirmed correct patient, procedure, site, and patient consented.   Anesthesia Topical anesthesia was used. Anesthetic medications included Lidocaine 2%, Proparacaine 0.5%.   Procedure Preparation included 5% betadine to ocular surface, eyelid speculum. A (32g) needle was used.   Injection:  2 mg aflibercept Alfonse Flavors) SOLN   NDC: A3590391, Lot: [id number], Expiration date: 10/13/2020   Route: Intravitreal, Site: Left Eye, Waste: 0.05 mL  Post-op Post injection exam found visual acuity of at least counting fingers. The patient tolerated the procedure well. There were no complications. The patient received written and verbal post procedure care education. Post injection medications were not given.                 ASSESSMENT/PLAN:    ICD-10-CM   1. Proliferative diabetic retinopathy of both eyes with macular edema associated with type 2 diabetes mellitus (HCC)  E94.0768 Intravitreal Injection, Pharmacologic Agent - OS - Left Eye    aflibercept (EYLEA) SOLN 2 mg  2. 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 Essential hypertension  I10   5. Hypertensive retinopathy of both eyes  H35.033   6. Pseudophakia of both eyes  Z96.1   7. History of stroke  Z86.73   8. Ocular hypertension, bilateral  H40.053      1,2. Proliferative diabetic retinopathy w/ DME OU  - lost to f/u from 11/2019 to now (06/2020) due to living out of  the area in [place] and unable to return  - lost to f/u 3.23.2020 to 7.13.2020 due to [place] restrictions  - history of VH OD onset 2.25.20 -- improved  - at initial presentation - difficult exam due to poor patient cooperation -- history of stroke -- improved now  - initial exam showed central edema OU and scattered IRH; +fine NVD OS; early flat NVE OD  - FA (08.19.20) shows NV nasal to disc OD; OS with NV along SN and ST arcades  - s/p PRP OS (02.18.20), fill-in (08.19.20)  - s/p PRP OD (03.23.30), fill-in (09.16.20)  - s/p IVTA OU #1 (02.25.20), #2 (03.23.20) -- lost to f/u due to COVID restrictions  - s/p IVA OU #1 (07.31.20), #2 (09.02.20), #3 (10.07.20), #4 (11.04.20)             - s/p IVA OS #5 (7.27.21)             - s/p IVE OU #1 (12.02.20)  - IOP 30 OD, 15 OS today -- restarted Cosopt and brimonidine TID OD  - add latanoprost QHS OD  - BCVA: stable at CF OD, stable at 20/80 OS -- suspect glaucomatous vision loss (OD disc w/ severe pallor)  - OCT shows persistent cystic changes OD, OS: Interval improvement in subhyaloid heme and IRF; persistent central vitreous traction  - recommend IVE OS #2 today, 08.24.21  - pt wishes to proceed  - RBA of procedure discussed, questions answered  - informed consent obtained and signed  - see procedure note  - Eylea4U benefits investigation started 11.4.20 -- approved for 2021  - f/u 4 weeks, DFE, OCT, possible injection  3. VMT OS  - stable VMT, contributing to central edema OS  - monitor  4,5. Hypertensive retinopathy OU  - discussed importance of tight BP control and its relation to 1,2 above  - monitor  6. Pseudophakia OU  - s/p CE/IOL OU  - doing well  - monitor  7. History of stroke  - ischemic stroke on 12/26/2018  - pt reports some visual decline following stroke  8. Ocular Hypertension  OU  - history of steroid response  - IOP elevated to 30 OD today, 08.24.21  - disc OD pale/atrophic and BCVA down to CF  - suspect glaucomatous vision loss since last visit in 2020 -- pt has not been on any pressure lowering drops  - restarted Cosopt and brimonidine TID OD -- will add latanoprost QHS  - will refer to [facility] for glaucoma eval  Ophthalmic Meds Ordered this visit:  Meds ordered this encounter  Medications  . latanoprost (XALATAN) 0.005 % ophthalmic solution    Sig: Place 1 drop into the right eye at bedtime.    Dispense:  2.5 mL    Refill:  6  . aflibercept (EYLEA) SOLN 2 mg     Return in about 4 weeks (around 09/03/2020) for f/u PDR OU, DFE, OCT.  There are no Patient Instructions on file for this visit.  Explained the diagnoses, plan, and follow up with the patient and they expressed understanding.  Patient expressed understanding of the importance of proper follow up care.   This document serves as a record of services personally performed by Gardiner Sleeper, MD, PhD. It was created on their behalf by Estill Bakes, COT an ophthalmic technician. The creation of this record is the provider's dictation and/or activities during the visit.    Electronically signed by: Estill Bakes, COT 8.19.21 @  12:40 PM   This document serves as a record of services personally performed by Gardiner Sleeper, MD, PhD. It was created on their behalf by San Jetty. Owens Shark, OA an ophthalmic technician. The creation of this record is the provider's dictation and/or activities during the visit.    Electronically signed by: San Jetty. Owens Shark, [place] 08.24.2021 12:40 PM  Gardiner Sleeper, M.D., Ph.D. Diseases & Surgery of the Retina and [place] 08/06/2020   I have reviewed the above documentation for accuracy and completeness, and I agree with the above. Gardiner Sleeper, M.D., Ph.D. 08/06/20 12:40 PM   Abbreviations: M myopia (nearsighted); A astigmatism;  H hyperopia (farsighted); P presbyopia; Mrx spectacle prescription;  CTL contact lenses; OD right eye; OS left eye; OU both eyes  XT exotropia; ET esotropia; PEK punctate epithelial keratitis; PEE punctate epithelial erosions; DES dry eye syndrome; MGD meibomian gland dysfunction; ATs artificial tears; PFAT's preservative free artificial tears; [place] nuclear sclerotic cataract; PSC posterior subcapsular cataract; ERM epi-retinal membrane; PVD posterior vitreous detachment; RD retinal detachment; DM diabetes mellitus; DR diabetic retinopathy; NPDR non-proliferative diabetic retinopathy; PDR proliferative diabetic retinopathy; CSME clinically significant macular edema; DME diabetic macular edema; dbh dot blot hemorrhages; CWS cotton wool spot; POAG primary open angle glaucoma; C/D cup-to-disc ratio; HVF humphrey visual field; GVF goldmann visual field; OCT optical coherence tomography; IOP intraocular pressure; BRVO Branch retinal vein occlusion; CRVO central retinal vein occlusion; CRAO central retinal artery occlusion; BRAO branch retinal artery occlusion; RT retinal tear; SB scleral buckle; PPV pars plana vitrectomy; VH Vitreous hemorrhage; PRP panretinal laser photocoagulation; IVK intravitreal kenalog; VMT vitreomacular traction; MH Macular hole;  NVD neovascularization of the disc; NVE neovascularization elsewhere; AREDS age related eye disease study; ARMD age related macular degeneration; POAG primary open angle glaucoma; EBMD epithelial/anterior basement membrane dystrophy; ACIOL anterior chamber intraocular lens; IOL intraocular lens; PCIOL posterior chamber intraocular lens; Phaco/IOL phacoemulsification with intraocular lens placement; [place] photorefractive keratectomy; LASIK laser assisted in situ keratomileusis; HTN hypertension; DM diabetes mellitus; COPD chronic obstructive pulmonary disease

## 2020-08-06 ENCOUNTER — Encounter (INDEPENDENT_AMBULATORY_CARE_PROVIDER_SITE_OTHER): Payer: Self-pay | Admitting: Ophthalmology

## 2020-08-06 ENCOUNTER — Other Ambulatory Visit: Payer: Self-pay

## 2020-08-06 ENCOUNTER — Ambulatory Visit (INDEPENDENT_AMBULATORY_CARE_PROVIDER_SITE_OTHER): Payer: Medicare HMO | Admitting: Ophthalmology

## 2020-08-06 DIAGNOSIS — H35033 Hypertensive retinopathy, bilateral: Secondary | ICD-10-CM

## 2020-08-06 DIAGNOSIS — H43822 Vitreomacular adhesion, left eye: Secondary | ICD-10-CM

## 2020-08-06 DIAGNOSIS — E113513 Type 2 diabetes mellitus with proliferative diabetic retinopathy with macular edema, bilateral: Secondary | ICD-10-CM | POA: Diagnosis not present

## 2020-08-06 DIAGNOSIS — Z8673 Personal history of transient ischemic attack (TIA), and cerebral infarction without residual deficits: Secondary | ICD-10-CM

## 2020-08-06 DIAGNOSIS — I10 Essential (primary) hypertension: Secondary | ICD-10-CM | POA: Diagnosis not present

## 2020-08-06 DIAGNOSIS — H3581 Retinal edema: Secondary | ICD-10-CM

## 2020-08-06 DIAGNOSIS — H40053 Ocular hypertension, bilateral: Secondary | ICD-10-CM

## 2020-08-06 DIAGNOSIS — Z961 Presence of intraocular lens: Secondary | ICD-10-CM

## 2020-08-06 MED ORDER — LATANOPROST 0.005 % OP SOLN
1.0000 [drp] | Freq: Every day | OPHTHALMIC | 6 refills | Status: DC
Start: 2020-08-06 — End: 2020-10-28

## 2020-08-06 MED ORDER — AFLIBERCEPT 2MG/0.05ML IZ SOLN FOR KALEIDOSCOPE
2.0000 mg | INTRAVITREAL | Status: AC | PRN
  Administered 2020-08-06: 2 mg via INTRAVITREAL

## 2020-08-07 ENCOUNTER — Encounter: Payer: Self-pay | Admitting: Family Medicine

## 2020-08-07 ENCOUNTER — Ambulatory Visit (INDEPENDENT_AMBULATORY_CARE_PROVIDER_SITE_OTHER): Payer: Medicare HMO | Admitting: Family Medicine

## 2020-08-07 VITALS — BP 132/60 | HR 59 | Temp 96.4°F | Resp 16 | Wt 136.2 lb

## 2020-08-07 DIAGNOSIS — F419 Anxiety disorder, unspecified: Secondary | ICD-10-CM | POA: Diagnosis not present

## 2020-08-07 DIAGNOSIS — I10 Essential (primary) hypertension: Secondary | ICD-10-CM

## 2020-08-07 DIAGNOSIS — R197 Diarrhea, unspecified: Secondary | ICD-10-CM | POA: Diagnosis not present

## 2020-08-07 DIAGNOSIS — Z09 Encounter for follow-up examination after completed treatment for conditions other than malignant neoplasm: Secondary | ICD-10-CM

## 2020-08-07 MED ORDER — BUSPIRONE HCL 5 MG PO TABS: 5.0000 mg | ORAL_TABLET | Freq: Every day | ORAL | 6 refills | Status: DC

## 2020-08-07 NOTE — Progress Notes (Signed)
[facility]    Established Patient Office Visit  Subjective:  Patient ID: Desiree Pollard, female    DOB: 04/25/1946  Age: 74 y.o. MRN: [id number]  CC:  Chief Complaint  Patient presents with  . Diarrhea    X2-3 months. Pt states time she stop taken the medication the diaherra starts back up again.    HPI Desiree Pollard is a 74 year old female who presents for Follow Up today.     Patient Active Problem List   Diagnosis Date Noted  . Type 2 diabetes mellitus with hyperglycemia, with long-term current use of insulin (HCC) 06/16/2020  . Hemoglobin A1c less than 7.0% 06/16/2020  . Hyperglycemia 06/16/2020  . Hypertension 06/16/2020  . History of stroke 06/16/2020    Past Medical History:  Diagnosis Date  . Breast cancer (HCC)    2017  . CVA (cerebral vascular accident) (HCC) 11/2018  . Diabetes mellitus without complication (HCC)   . Diabetic retinopathy (HCC)    PDR OU  . Hypertensive retinopathy    OU  . Stroke (HCC)     Past Surgical History:  Procedure Laterality Date  . CATARACT EXTRACTION    . EYE SURGERY    . MASTECTOMY Bilateral    "2017" Left Mastectomy; "2019" Right Mastectomy  . PACEMAKER IMPLANT  2017   Current Status: Since she last office visit, she has c/o diarrhea X 5-6 episodes a day. She recently seen at the Urgent Care in [place], where she is currently living with her sister. She was previously prescribe Azithromycin 500 mg X 5 days, which she has not had an episode today as of yet. She is accompanied by her sister today who is her caregiver. Denies GI problems such as nausea, vomiting, and constipation. She has no reports of blood in stools, dysuria and hematuria. She denies fevers, chills, fatigue, recent infections, weight loss, and night sweats. She has not had any headaches, visual changes, dizziness, and falls. No chest pain, heart palpitations, cough and shortness of breath reported.  No  depression or anxiety, and denies suicidal ideations, homicidal ideations, or auditory hallucinations. She is taking all medications as prescribed. She denies pain today.   Family History  Problem Relation Age of Onset  . Cancer Mother   . Heart disease Father   . Diabetes Sister   . Diabetes Brother     Social History   Socioeconomic History  . Marital status: Legally Separated    Spouse name: Not on file  . Number of children: 1  . Years of education: Not on file  . Highest education level: Not on file  Occupational History  . Not on file  Tobacco Use  . Smoking status: Former Smoker  . Smokeless tobacco: Never Used  Vaping Use  . Vaping Use: Never used  Substance and Sexual Activity  . Alcohol use: Not Currently  . Drug use: Never  . Sexual activity: Not Currently  Other Topics Concern  . Not on file  Social History Narrative   Lives with Sister   Social Determinants of Health   Financial Resource Strain:   . Difficulty of Paying Living Expenses: Not on file  Food Insecurity:   . Worried About Running Out of Food in the Last Year: Not on file  . Ran Out of Food in the Last Year: Not on file  Transportation Needs:   . Lack of Transportation (Medical): Not on file  . Lack of   Transportation (Non-Medical): Not on file  Physical Activity:   . Days of Exercise per Week: Not on file  . Minutes of Exercise per Session: Not on file  Stress:   . Feeling of Stress : Not on file  Social Connections:   . Frequency of Communication with Friends and Family: Not on file  . Frequency of Social Gatherings with Friends and Family: Not on file  . Attends Religious Services: Not on file  . Active Member of Clubs or Organizations: Not on file  . Attends Club or Organization Meetings: Not on file  . Marital Status: Not on file  Intimate Partner Violence:   . Fear of Current or Ex-Partner: Not on file  . Emotionally Abused: Not on file  . Physically Abused: Not on file  .  Sexually Abused: Not on file    Outpatient Medications Prior to Visit  Medication Sig Dispense Refill  . Accu-Chek FastClix Lancets MISC USE AS DIRECTED TO TEST BLOOD GLUCOSE DAILY    . aspirin EC 81 MG tablet Take by mouth.    . atorvastatin (LIPITOR) 80 MG tablet Take by mouth.    . Blood Glucose Monitoring Suppl (FIFTY50 GLUCOSE METER 2.0) w/Device KIT Use to check blood sugars. Freestyle glucose meter. Okay to substitute for other meter preferred by insurance. ICD-10 E11.9.    . brimonidine (ALPHAGAN) 0.2 % ophthalmic solution Place 1 drop into both eyes 2 (two) times daily. 10 mL 6  . carvedilol (COREG) 25 MG tablet Take by mouth.    . donepezil (ARICEPT) 10 MG tablet Take 10 mg by mouth at bedtime.    . dorzolamide-timolol (COSOPT) 22.3-6.8 MG/ML ophthalmic solution INSTILL 1 DROP INTO BOTH EYES TWICE A DAY 10 mL 3  . enalapril (VASOTEC) 5 MG tablet Take 5 mg by mouth daily.    . insulin lispro (HUMALOG) 100 UNIT/ML injection Inject into the skin.     . Insulin Pen Needle (FIFTY50 PEN NEEDLES) 32G X 4 MM MISC Needles for insulin pen. Inject as instructed E11.9    . KLOR-CON M20 20 MEQ tablet Take 20 mEq by mouth daily.    . lisinopril (PRINIVIL,ZESTRIL) 20 MG tablet Take 10 mg by mouth daily.     . NIFEdipine (PROCARDIA XL/NIFEDICAL XL) 60 MG 24 hr tablet Take 60 mg by mouth daily.     . insulin NPH-regular Human (70-30) 100 UNIT/ML injection Inject into the skin. (Patient not taking: Reported on 08/07/2020)    . latanoprost (XALATAN) 0.005 % ophthalmic solution Place 1 drop into the right eye at bedtime. (Patient not taking: Reported on 08/07/2020) 2.5 mL 6  . loperamide (IMODIUM A-D) 2 MG tablet Take 1 tablet (2 mg total) by mouth 4 (four) times daily as needed for diarrhea or loose stools. (Patient not taking: Reported on 08/07/2020) 30 tablet 2  . meclizine (ANTIVERT) 25 MG tablet Take 1 tablet (25 mg total) by mouth 3 (three) times daily as needed for dizziness. (Patient not taking:  Reported on 08/07/2020) 30 tablet 0  . ondansetron (ZOFRAN) 4 MG tablet Take 4 mg by mouth every 6 (six) hours as needed.  (Patient not taking: Reported on 08/07/2020)     Facility-Administered Medications Prior to Visit  Medication Dose Route Frequency Provider Last Rate Last Admin  . triamcinolone acetonide (KENALOG-40) injection 4 mg  4 mg Intravitreal  Zamora, Brian, MD   4 mg at 02/07/19 1653  . triamcinolone acetonide (KENALOG-40) injection 4 mg  4 mg Intravitreal  Zamora, Brian,   MD   4 mg at 02/07/19 1659    Allergies  Allergen Reactions  . Amlodipine Nausea And Vomiting    Pt refuses to take due to severe n/v that began after starting amlodipine and stopped after discontinuation.    ROS Review of Systems  Constitutional: Negative.   HENT: Negative.   Eyes: Negative.   Respiratory: Negative.   Cardiovascular: Negative.   Gastrointestinal: Positive for diarrhea.  Endocrine: Negative.   Genitourinary: Negative.   Musculoskeletal: Positive for arthralgias (generalized joint pain).  Skin: Negative.   Allergic/Immunologic: Negative.   Neurological: Negative.   Hematological: Negative.   Psychiatric/Behavioral: Negative.       Objective:    Physical Exam Vitals and nursing note reviewed.  Constitutional:      Appearance: Normal appearance.     Comments: Rolling walker  HENT:     Head: Normocephalic and atraumatic.     Nose: Nose normal.     Mouth/Throat:     Mouth: Mucous membranes are moist.     Pharynx: Oropharynx is clear.  Cardiovascular:     Rate and Rhythm: Normal rate and regular rhythm.     Pulses: Normal pulses.     Heart sounds: Normal heart sounds.  Pulmonary:     Effort: Pulmonary effort is normal.     Breath sounds: Normal breath sounds.  Abdominal:     General: Bowel sounds are normal.     Palpations: Abdomen is soft.  Musculoskeletal:        General: Normal range of motion.     Cervical back: Normal range of motion and neck supple.  Skin:     General: Skin is warm and dry.  Neurological:     General: No focal deficit present.     Mental Status: She is alert and oriented to person, place, and time.  Psychiatric:        Mood and Affect: Mood normal.        Behavior: Behavior normal.        Thought Content: Thought content normal.        Judgment: Judgment normal.    BP 132/60 (BP Location: Left Arm, Patient Position: Sitting, Cuff Size: Normal)   Pulse (!) 59   Temp (!) 96.4 F (35.8 C)   Resp 16   Wt 136 lb 3.2 oz (61.8 kg)   SpO2 100%   BMI 21.33 kg/m  Wt Readings from Last 3 Encounters:  08/07/20 136 lb 3.2 oz (61.8 kg)  06/10/20 137 lb (62.1 kg)  12/30/18 128 lb (58.1 kg)     Health Maintenance Due  Topic Date Due  . Hepatitis C Screening  Never done  . FOOT EXAM  Never done  . COVID-19 Vaccine (1) Never done  . DEXA SCAN  Never done  . INFLUENZA VACCINE  07/14/2020    There are no preventive care reminders to display for this patient.  Lab Results  Component Value Date   TSH 1.650 06/10/2020   Lab Results  Component Value Date   WBC 6.0 08/07/2020   HGB 10.5 (L) 08/07/2020   HCT 34.4 08/07/2020   MCV 89 08/07/2020   PLT 199 08/07/2020   Lab Results  Component Value Date   NA 143 08/07/2020   K 6.8 (HH) 08/07/2020   CO2 17 (L) 08/07/2020   GLUCOSE 113 (H) 08/07/2020   BUN 27 08/07/2020   CREATININE 2.56 (H) 08/07/2020   BILITOT 0.2 08/07/2020   ALKPHOS 109 08/07/2020   AST  15 08/07/2020   ALT 10 08/07/2020   PROT 7.8 08/07/2020   ALBUMIN 4.4 08/07/2020   CALCIUM 9.8 08/07/2020   ANIONGAP 11 12/30/2018   Lab Results  Component Value Date   CHOL 137 06/10/2020   Lab Results  Component Value Date   HDL 47 06/10/2020   Lab Results  Component Value Date   LDLCALC 70 06/10/2020   Lab Results  Component Value Date   TRIG 110 06/10/2020   Lab Results  Component Value Date   CHOLHDL 2.9 06/10/2020   Lab Results  Component Value Date   HGBA1C 6.2 (A) 06/10/2020   HGBA1C 6.2  06/10/2020   HGBA1C 6.2 06/10/2020   HGBA1C 6.2 06/10/2020      Assessment & Plan:   1. Hospital discharge follow-up  2. Diarrhea, unspecified type Stable today. Pick up Imodium and use as needed.  - Ambulatory referral to Gastroenterology - CBC with Differential - Comprehensive metabolic panel - Ova and parasite examination - Cdiff NAA+O+P+Stool Culture  3. Hypertension, unspecified type  The current medical regimen is effective; blood pressure is stable at 132/60 today; continue present plan and medications as prescribed. She will continue to take medications as prescribed, to decrease high sodium intake, excessive alcohol intake, increase potassium intake, smoking cessation, and increase physical activity of at least 30 minutes of cardio activity daily. She will continue to follow Heart Healthy or DASH diet.  4. Anxiety - busPIRone (BUSPAR) 5 MG tablet; Take 1 tablet (5 mg total) by mouth daily.  Dispense: 30 tablet; Refill: 6  5. Follow up She will follow up in 3 months.   Meds ordered this encounter  Medications  . busPIRone (BUSPAR) 5 MG tablet    Sig: Take 1 tablet (5 mg total) by mouth daily.    Dispense:  30 tablet    Refill:  6    Orders Placed This Encounter  Procedures  . Ova and parasite examination  . Cdiff NAA+O+P+Stool Culture  . CBC with Differential  . Comprehensive metabolic panel  . Ambulatory referral to Gastroenterology    Referral Orders     Ambulatory referral to Gastroenterology   Kathe Becton,  MSN, FNP-BC [facility] [address] [place], [place] [address] [phone] fax   Problem List Items Addressed This Visit      Cardiovascular and Mediastinum   Hypertension    Other Visit Diagnoses    Diarrhea, unspecified type    -  Primary   Relevant Orders   Ambulatory referral to Gastroenterology   CBC with Differential  (Completed)   Comprehensive metabolic panel (Completed)   Ova and parasite examination   Cdiff NAA+O+P+Stool Culture   Hospital discharge follow-up       Anxiety       Relevant Medications   busPIRone (BUSPAR) 5 MG tablet   Follow up          Meds ordered this encounter  Medications  . busPIRone (BUSPAR) 5 MG tablet    Sig: Take 1 tablet (5 mg total) by mouth daily.    Dispense:  30 tablet    Refill:  6    Follow-up: No follow-ups on file.    [name], FNP

## 2020-08-08 ENCOUNTER — Encounter: Payer: Self-pay | Admitting: Family Medicine

## 2020-08-08 LAB — COMPREHENSIVE METABOLIC PANEL
ALT: 10 IU/L (ref 0–32)
AST: 15 IU/L (ref 0–40)
Albumin/Globulin Ratio: 1.3 (ref 1.2–2.2)
Albumin: 4.4 g/dL (ref 3.7–4.7)
Alkaline Phosphatase: 109 IU/L (ref 48–121)
BUN/Creatinine Ratio: 11 — ABNORMAL LOW (ref 12–28)
BUN: 27 mg/dL (ref 8–27)
Bilirubin Total: 0.2 mg/dL (ref 0.0–1.2)
CO2: 17 mmol/L — ABNORMAL LOW (ref 20–29)
Calcium: 9.8 mg/dL (ref 8.7–10.3)
Chloride: 115 mmol/L — ABNORMAL HIGH (ref 96–106)
Creatinine, Ser: 2.56 mg/dL — ABNORMAL HIGH (ref 0.57–1.00)
GFR calc Af Amer: 20 mL/min/{1.73_m2} — ABNORMAL LOW (ref >59)
GFR calc non Af Amer: 18 mL/min/{1.73_m2} — ABNORMAL LOW (ref >59)
Globulin, Total: 3.4 g/dL (ref 1.5–4.5)
Glucose: 113 mg/dL — ABNORMAL HIGH (ref 65–99)
Potassium: 6.8 mmol/L (ref 3.5–5.2)
Sodium: 143 mmol/L (ref 134–144)
Total Protein: 7.8 g/dL (ref 6.0–8.5)

## 2020-08-08 LAB — CBC WITH DIFFERENTIAL/PLATELET
Basophils Absolute: 0.1 10*3/uL (ref 0.0–0.2)
Basos: 1 %
EOS (ABSOLUTE): 0.3 10*3/uL (ref 0.0–0.4)
Eos: 5 %
Hematocrit: 34.4 % (ref 34.0–46.6)
Hemoglobin: 10.5 g/dL — ABNORMAL LOW (ref 11.1–15.9)
Immature Grans (Abs): 0 10*3/uL (ref 0.0–0.1)
Immature Granulocytes: 0 %
Lymphocytes Absolute: 1.6 10*3/uL (ref 0.7–3.1)
Lymphs: 27 %
MCH: 27.2 pg (ref 26.6–33.0)
MCHC: 30.5 g/dL — ABNORMAL LOW (ref 31.5–35.7)
MCV: 89 fL (ref 79–97)
Monocytes Absolute: 0.4 10*3/uL (ref 0.1–0.9)
Monocytes: 7 %
Neutrophils Absolute: 3.6 10*3/uL (ref 1.4–7.0)
Neutrophils: 60 %
Platelets: 199 10*3/uL (ref 150–450)
RBC: 3.86 x10E6/uL (ref 3.77–5.28)
RDW: 14 % (ref 11.7–15.4)
WBC: 6 10*3/uL (ref 3.4–10.8)

## 2020-08-09 ENCOUNTER — Other Ambulatory Visit: Payer: Self-pay | Admitting: Family Medicine

## 2020-08-09 ENCOUNTER — Encounter: Payer: Self-pay | Admitting: Family Medicine

## 2020-08-09 DIAGNOSIS — R197 Diarrhea, unspecified: Secondary | ICD-10-CM

## 2020-08-09 MED ORDER — LOPERAMIDE HCL 2 MG PO TABS: 2.0000 mg | ORAL_TABLET | Freq: Four times a day (QID) | ORAL | 2 refills | Status: DC | PRN

## 2020-08-12 ENCOUNTER — Encounter: Payer: Self-pay | Admitting: Gastroenterology

## 2020-08-15 ENCOUNTER — Telehealth: Payer: Self-pay

## 2020-08-15 NOTE — Telephone Encounter (Signed)
-----   Message from [name], [employer] sent at 08/15/2020 11:49 AM EDT ----- Regarding: "Stool Cultures" Please inform patient's sister that patient's stool sample is negative for [place]., E. Coli, and  C-diff. Other studies have not resulted as of yet. Typically, cultures take a week or so to result. We will inform patient as other results post. Patient should continue to increase fluids and take Imodium as needed. She should report to office if episodes of diarrhea increase or if patient develops nausea, vomiting, and fevers. Thank you.

## 2020-08-15 NOTE — Telephone Encounter (Signed)
-----   Message from [name], [place] sent at 08/15/2020 11:49 AM EDT ----- Regarding: "Stool Cultures" Please inform patient's sister that patient's stool sample is negative for [place]., E. Coli, and  C-diff. Other studies have not resulted as of yet. Typically, cultures take a week or so to result. We will inform patient as other results post. Patient should continue to increase fluids and take Imodium as needed. She should report to office if episodes of diarrhea increase or if patient develops nausea, vomiting, and fevers. Thank you.

## 2020-08-21 LAB — CDIFF NAA+O+P+STOOL CULTURE
E coli, Shiga toxin Assay: NEGATIVE
Toxigenic C. Difficile by PCR: NEGATIVE

## 2020-08-22 NOTE — Progress Notes (Signed)
Pt was called and she stated I already called her and there was nothing else I can help her with.

## 2020-08-30 ENCOUNTER — Other Ambulatory Visit: Payer: Self-pay | Admitting: Family Medicine

## 2020-08-30 DIAGNOSIS — F419 Anxiety disorder, unspecified: Secondary | ICD-10-CM

## 2020-08-30 NOTE — Progress Notes (Signed)
[facility] Note  09/04/2020     CHIEF COMPLAINT Patient presents for Retina Follow Up   HISTORY OF PRESENT ILLNESS: Desiree Pollard is a 74 y.o. female who presents to the clinic today for:   HPI    Retina Follow Up    Patient presents with  Diabetic Retinopathy.  In both eyes.  This started 4 weeks ago.  I, the attending physician,  performed the HPI with the patient and updated documentation appropriately.          Comments    Patient here for 4 weeks retina follow up for PDR OU. Patient states vision still the same. No eye pain.       Last edited by Bernarda Caffey, MD on 09/04/2020 10:13 AM. (History)    pt states she saw Dr. Katy Fitch on September 1 for glaucoma eval, pt states he is going to refer her to [facility] for surgery, but the appt is not until November 30, pt states her right eye is not painful  Referring physician: Phylliss Blakes, OD [place],  [place] [address]  HISTORICAL INFORMATION:   Selected notes from the MEDICAL RECORD NUMBER Referred by Dr. Angelena Form for DM exam LEE: 02.04.20 (A. Hager) [BCVA: OD: 20/400 OS: 20/150] Ocular Hx-ptosis OU, pseudo OU, diabetic ret OU PMH-hx of stroke (1 month), cancer, DM    CURRENT MEDICATIONS: Current Outpatient Medications (Ophthalmic Drugs)  Medication Sig  . dorzolamide-timolol (COSOPT) 22.3-6.8 MG/ML ophthalmic solution INSTILL 1 DROP INTO BOTH EYES TWICE A DAY  . latanoprost (XALATAN) 0.005 % ophthalmic solution Place 1 drop into the right eye at bedtime. (Patient not taking: Reported on 08/07/2020)   No current facility-administered medications for this visit. (Ophthalmic Drugs)   Current Outpatient Medications (Other)  Medication Sig  . Accu-Chek FastClix Lancets MISC USE AS DIRECTED TO TEST BLOOD GLUCOSE DAILY  . aspirin EC 81 MG tablet Take by mouth.  Marland Kitchen atorvastatin (LIPITOR) 80 MG tablet Take by mouth.  . Blood Glucose Monitoring Suppl (FIFTY50 GLUCOSE METER 2.0) w/Device  KIT Use to check blood sugars. Freestyle glucose meter. Okay to substitute for other meter preferred by insurance. ICD-10 E11.9.  . busPIRone (BUSPAR) 5 MG tablet Take 1 tablet (5 mg total) by mouth daily.  . carvedilol (COREG) 25 MG tablet Take by mouth.  . donepezil (ARICEPT) 10 MG tablet Take 10 mg by mouth at bedtime.  . enalapril (VASOTEC) 5 MG tablet Take 5 mg by mouth daily.  . insulin lispro (HUMALOG) 100 UNIT/ML injection Inject into the skin.   Marland Kitchen insulin NPH-regular Human (70-30) 100 UNIT/ML injection Inject into the skin. (Patient not taking: Reported on 08/07/2020)  . Insulin Pen Needle (FIFTY50 PEN NEEDLES) 32G X 4 MM MISC Needles for insulin pen. Inject as instructed E11.9  . KLOR-CON M20 20 MEQ tablet Take 20 mEq by mouth daily.  Marland Kitchen lisinopril (PRINIVIL,ZESTRIL) 20 MG tablet Take 10 mg by mouth daily.   Marland Kitchen loperamide (IMODIUM A-D) 2 MG tablet Take 1 tablet (2 mg total) by mouth 4 (four) times daily as needed for diarrhea or loose stools.  . meclizine (ANTIVERT) 25 MG tablet Take 1 tablet (25 mg total) by mouth 3 (three) times daily as needed for dizziness. (Patient not taking: Reported on 08/07/2020)  . NIFEdipine (PROCARDIA XL/NIFEDICAL XL) 60 MG 24 hr tablet Take 60 mg by mouth daily.   . ondansetron (ZOFRAN) 4 MG tablet Take 4 mg by mouth every 6 (  six) hours as needed.  (Patient not taking: Reported on 08/07/2020)   Current Facility-Administered Medications (Other)  Medication Route  . triamcinolone acetonide (KENALOG-40) injection 4 mg Intravitreal  . triamcinolone acetonide (KENALOG-40) injection 4 mg Intravitreal   REVIEW OF SYSTEMS: ROS    Positive for: Neurological, Musculoskeletal, Endocrine, Cardiovascular, Eyes, Heme/Lymph   Negative for: Constitutional, Gastrointestinal, Skin, Genitourinary, HENT, Respiratory, Psychiatric, Allergic/Imm   Last edited by Laddie Aquas, COA on 09/04/2020  9:39 AM. (History)     ALLERGIES Allergies  Allergen Reactions  . Amlodipine  Nausea And Vomiting    Pt refuses to take due to severe n/v that began after starting amlodipine and stopped after discontinuation.   PAST MEDICAL HISTORY Past Medical History:  Diagnosis Date  . Breast cancer (HCC)    2017  . CVA (cerebral vascular accident) (HCC) 11/2018  . Dehydration 07/2020  . Diabetes mellitus without complication (HCC)   . Diabetic retinopathy (HCC)    PDR OU  . Frequent diarrhea 07/2020  . Hyperkalemia 07/2020  . Hypertensive retinopathy    OU  . Stroke [facility])    Past Surgical History:  Procedure Laterality Date  . CATARACT EXTRACTION    . EYE SURGERY    . MASTECTOMY Bilateral    "2017" Left Mastectomy; "2019" Right Mastectomy  . PACEMAKER IMPLANT  2017   FAMILY HISTORY Family History  Problem Relation Age of Onset  . Cancer Mother   . Heart disease Father   . Diabetes Sister   . Diabetes Brother    SOCIAL HISTORY Social History   Tobacco Use  . Smoking status: Former Games developer  . Smokeless tobacco: Never Used  Vaping Use  . Vaping Use: Never used  Substance Use Topics  . Alcohol use: Not Currently  . Drug use: Never         OPHTHALMIC EXAM:  Base Eye Exam    Visual Acuity (Snellen - Linear)      Right Left   Dist [place] CF 20/150 -2   Dist ph [place] NI NI       Tonometry (Tonopen, 9:36 AM)      Right Left   Pressure 23 13       Pupils      Dark Light Shape React APD   Right 2 1 Round Minimal None   Left 2 1 Round Minimal None       Visual Fields (Counting fingers)      Left Right    Full    Restrictions  Total superior temporal, superior nasal, inferior nasal deficiencies       Extraocular Movement      Right Left    Full Full       Neuro/Psych    Oriented x3: Yes   Mood/Affect: Normal       Dilation    Both eyes: 1.0% Mydriacyl, 2.5% Phenylephrine @ 9:33 AM        Slit Lamp and Fundus Exam    Slit Lamp Exam      Right Left   Lids/Lashes Dermatochalasis - upper lid, nasal Ectropion - lower lid Dermatochalasis  - upper lid, Meibomian gland dysfunction, mild, nasal Ectropion, lower lid   Conjunctiva/Sclera nasal and temporal Pinguecula, Melanosis nasal and temporal Pinguecula, Melanosis   Cornea mild Arcus, Well healed cataract wounds, tear film debris, 2+ PEE mild Arcus, Well healed cataract wounds, 2+ Punctate epithelial erosions, tear film debris   Anterior Chamber deep and clear deep and clear   Iris  Round and moderately dilated to 5 mm Round and moderately dilated to 33mm, No NVI   Lens Posterior chamber intraocular lens, trace Posterior capsular opacification Posterior chamber intraocular lens, trace Posterior capsular opacification   Vitreous Vitreous syneresis, mild residual white VH inferiorly Vitreous syneresis       Fundus Exam      Right Left   Disc 3-4+ pallor, sharp rim, +atrophy 2+pallor, sharp, fine NVD - regressing, sharp rim   C/D Ratio 0.9 0.1   Macula Blunted foveal reflex, scattered MA, +ERM, central edema--increased, atrophic and ischemic Blunted foveal reflex, interval improvement in large subhyaloid boat-shaped heme obscuring inferior third of macula, persistent edema, scattered IRH   Vessels Severe Vascular attenuation, Tortuous, +fibrosis along ST arcades--improved severe Vascular attenuation, Tortuous, +NV   Periphery Attached, scattered IRH/MA, good 360 PRP fill in Attached, scattered IRH and pre-retinal heme, 360 PRP w/room for fill-in posteriorly          IMAGING AND PROCEDURES  Imaging and Procedures for $RemoveBefore'@TODAY'[id number]$ @  OCT, Retina - OU - Both Eyes       Right Eye Quality was good. Central Foveal Thickness: 408. Progression has worsened. Findings include abnormal foveal contour, intraretinal fluid, intraretinal hyper-reflective material, no SRF, outer retinal atrophy, epiretinal membrane, macular pucker (Interval increase in central IRF).   Left Eye Quality was good. Central Foveal Thickness: 962. Progression has improved. Findings include abnormal foveal contour,  intraretinal fluid, epiretinal membrane, no SRF, preretinal fibrosis, vitreous traction (Interval improvement in subhyaloid heme and vitreous traction).   Notes *Images captured and stored on drive  Diagnosis / Impression:  DME OU Mild ERM OU OD: Interval increase in central IRF OS: Interval improvement in subhyaloid heme and vitreous traction; persistent IRF/edema   Clinical management:  See below  Abbreviations: NFP - Normal foveal profile. CME - cystoid macular edema. PED - pigment epithelial detachment. IRF - intraretinal fluid. SRF - subretinal fluid. EZ - ellipsoid zone. ERM - epiretinal membrane. ORA - outer retinal atrophy. ORT - outer retinal tubulation. SRHM - subretinal hyper-reflective material        Intravitreal Injection, Pharmacologic Agent - OD - Right Eye       Time Out 09/04/2020. 10:26 AM. Confirmed correct patient, procedure, site, and patient consented.   Anesthesia Topical anesthesia was used. Anesthetic medications included Lidocaine 2%, Proparacaine 0.5%.   Procedure Preparation included 5% betadine to ocular surface, eyelid speculum. A (32g) needle was used.   Injection:  2 mg aflibercept Alfonse Flavors) SOLN   NDC: A3590391, Lot: [id number], Expiration date: 12/13/2020   Route: Intravitreal, Site: Right Eye, Waste: 0.05 mL  Post-op Post injection exam found visual acuity of at least counting fingers. The patient tolerated the procedure well. There were no complications. The patient received written and verbal post procedure care education. Post injection medications were not given.   Notes An AC tap was performed following injection due to elevated IOP using a 30 gauge needle on a syringe with the plunger removed. The needle was placed at the limbus at 7 oclock and approximately 0.1cc of aqueous was removed from the anterior chamber. Betadine was applied to the tap area before and after the paracentesis was performed. There were no complications. The  patient tolerated the procedure well. The IOP was rechecked and was found to be ~9 mmHg by palpation.        Intravitreal Injection, Pharmacologic Agent - OS - Left Eye       Time Out 09/04/2020. 10:29 AM. Confirmed correct patient,  procedure, site, and patient consented.   Anesthesia Topical anesthesia was used. Anesthetic medications included Lidocaine 2%, Proparacaine 0.5%.   Procedure Preparation included 5% betadine to ocular surface, eyelid speculum. A (32g) needle was used.   Injection:  2 mg aflibercept Alfonse Flavors) SOLN   NDC: M7179715, Lot: [id number], Expiration date: 06/12/2020   Route: Intravitreal, Site: Left Eye, Waste: 0.05 mL  Post-op Post injection exam found visual acuity of at least counting fingers. The patient tolerated the procedure well. There were no complications. The patient received written and verbal post procedure care education. Post injection medications were not given.                 ASSESSMENT/PLAN:    ICD-10-CM   1. Proliferative diabetic retinopathy of both eyes with macular edema associated with type 2 diabetes mellitus (HCC)  G31.5176 Intravitreal Injection, Pharmacologic Agent - OD - Right Eye    Intravitreal Injection, Pharmacologic Agent - OS - Left Eye    aflibercept (EYLEA) SOLN 2 mg    aflibercept (EYLEA) SOLN 2 mg  2. 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 Essential hypertension  I10   5. Hypertensive retinopathy of both eyes  H35.033   6. Pseudophakia of both eyes  Z96.1   7. History of stroke  Z86.73   8. Ocular hypertension, bilateral  H40.053     1,2. Proliferative diabetic retinopathy w/ DME OU  - lost to f/u from 11/2019 to 06/2020 due to living out of the area in [place] and unable to return  - lost to f/u 3.23.2020 to 7.13.2020 due to [place] restrictions  - history of VH OD onset 2.25.20 -- improved  - at initial presentation -  difficult exam due to poor patient cooperation -- history of stroke -- improved now  - initial exam showed central edema OU and scattered IRH; +fine NVD OS; early flat NVE OD  - FA (08.19.20) shows NV nasal to disc OD; OS with NV along SN and ST arcades  - s/p PRP OS (02.18.20), fill-in (08.19.20)  - s/p PRP OD (03.23.30), fill-in (09.16.20)  - s/p IVTA OU #1 (02.25.20), #2 (03.23.20) -- lost to f/u due to COVID restrictions  - s/p IVA OU #1 (07.31.20), #2 (09.02.20), #3 (10.07.20), #4 (11.04.20)             - s/p IVA OS #5 (7.27.21)             - s/p IVE OU #1 (12.02.20)  - s/p IVE OS #2 (08.24.21)  - IOP 23 OD, 13 OS today -- on Cosopt and brimonidine TID OD + latanoprost QHS OD  - BCVA: decreased to LP from CF OD, decreased to 20/150-2 from 20/80 OS -- suspect glaucomatous vision loss (OD disc w/ severe pallor)  - OCT shows interval increase in central IRF OD, OS: Interval improvement in subhyaloid heme and IRF; persistent central vitreous traction  - recommend IVE OD #2 and OS #3 today, 09.22.21  - pt wishes to proceed  - RBA of procedure discussed, questions answered  - informed consent obtained and signed  - see procedure note -- AC tap OD  - Eylea4U benefits investigation started 11.4.20 -- approved for 2021  - f/u 4 weeks, DFE, OCT, possible injection  3. VMT OS  - stable VMT, contributing to central edema OS  - monitor  4,5. Hypertensive retinopathy OU  - discussed  importance of tight BP control and its relation to 1,2 above  - monitor  6. Pseudophakia OU  - s/p CE/IOL OU  - doing well  - monitor  7. History of stroke  - ischemic stroke on 12/26/2018  - pt reports some visual decline following stroke  8. Ocular Hypertension OU  - history of steroid response  - IOP elevated to 23 OD today, 09.22.21  - disc OD pale/atrophic and BCVA down to LP  - suspect glaucomatous vision loss since last visit in 2020 -- pt has not been on any pressure lowering drops  - restarted  Cosopt and brimonidine TID OD, added latanoprost QHS OD  - now under the expert management of [facility] -- referral made to Dr. Carlynn Purl at [facility]  Ophthalmic Meds Ordered this visit:  Meds ordered this encounter  Medications  . aflibercept (EYLEA) SOLN 2 mg  . aflibercept (EYLEA) SOLN 2 mg     Return in about 4 weeks (around 10/02/2020) for f/u PDR OU, DFE, OCT.  There are no Patient Instructions on file for this visit.  Explained the diagnoses, plan, and follow up with the patient and they expressed understanding.  Patient expressed understanding of the importance of proper follow up care.   This document serves as a record of services personally performed by Karie Chimera, MD, PhD. It was created on their behalf by Annalee Genta, COMT. The creation of this record is the provider's dictation and/or activities during the visit.  Electronically signed by: Annalee Genta, COMT 09/04/20 11:13 AM  This document serves as a record of services personally performed by Karie Chimera, MD, PhD. It was created on their behalf by Glee Arvin. Manson Passey, OA an ophthalmic technician. The creation of this record is the provider's dictation and/or activities during the visit.    Electronically signed by: Glee Arvin. Manson Passey, [place] 09.22.2021 11:13 AM   Karie Chimera, M.D., Ph.D. Diseases & Surgery of the Retina and Vitreous [facility]  I have reviewed the above documentation for accuracy and completeness, and I agree with the above. Karie Chimera, M.D., Ph.D. 09/04/20 11:13 AM   Abbreviations: M myopia (nearsighted); A astigmatism; H hyperopia (farsighted); P presbyopia; Mrx spectacle prescription;  CTL contact lenses; OD right eye; OS left eye; OU both eyes  XT exotropia; ET esotropia; PEK punctate epithelial keratitis; PEE punctate epithelial erosions; DES dry eye syndrome; MGD meibomian gland dysfunction; ATs artificial tears; PFAT's preservative free artificial tears; NSC nuclear  sclerotic cataract; PSC posterior subcapsular cataract; ERM epi-retinal membrane; PVD posterior vitreous detachment; RD retinal detachment; DM diabetes mellitus; DR diabetic retinopathy; NPDR non-proliferative diabetic retinopathy; PDR proliferative diabetic retinopathy; CSME clinically significant macular edema; DME diabetic macular edema; dbh dot blot hemorrhages; CWS cotton wool spot; POAG primary open angle glaucoma; C/D cup-to-disc ratio; HVF humphrey visual field; GVF goldmann visual field; OCT optical coherence tomography; IOP intraocular pressure; BRVO Branch retinal vein occlusion; CRVO central retinal vein occlusion; CRAO central retinal artery occlusion; BRAO branch retinal artery occlusion; RT retinal tear; SB scleral buckle; PPV pars plana vitrectomy; VH Vitreous hemorrhage; PRP panretinal laser photocoagulation; IVK intravitreal kenalog; VMT vitreomacular traction; MH Macular hole;  NVD neovascularization of the disc; NVE neovascularization elsewhere; AREDS age related eye disease study; ARMD age related macular degeneration; POAG primary open angle glaucoma; EBMD epithelial/anterior basement membrane dystrophy; ACIOL anterior chamber intraocular lens; IOL intraocular lens; PCIOL posterior chamber intraocular lens; Phaco/IOL phacoemulsification with intraocular lens placement; PRK photorefractive keratectomy;  LASIK laser assisted in situ keratomileusis; HTN hypertension; DM diabetes mellitus; COPD chronic obstructive pulmonary disease

## 2020-09-04 ENCOUNTER — Other Ambulatory Visit: Payer: Self-pay

## 2020-09-04 ENCOUNTER — Ambulatory Visit (INDEPENDENT_AMBULATORY_CARE_PROVIDER_SITE_OTHER): Payer: Medicare HMO | Admitting: Ophthalmology

## 2020-09-04 ENCOUNTER — Encounter (INDEPENDENT_AMBULATORY_CARE_PROVIDER_SITE_OTHER): Payer: Self-pay | Admitting: Ophthalmology

## 2020-09-04 DIAGNOSIS — Z961 Presence of intraocular lens: Secondary | ICD-10-CM

## 2020-09-04 DIAGNOSIS — H43822 Vitreomacular adhesion, left eye: Secondary | ICD-10-CM

## 2020-09-04 DIAGNOSIS — H3581 Retinal edema: Secondary | ICD-10-CM

## 2020-09-04 DIAGNOSIS — E113513 Type 2 diabetes mellitus with proliferative diabetic retinopathy with macular edema, bilateral: Secondary | ICD-10-CM

## 2020-09-04 DIAGNOSIS — Z8673 Personal history of transient ischemic attack (TIA), and cerebral infarction without residual deficits: Secondary | ICD-10-CM

## 2020-09-04 DIAGNOSIS — I10 Essential (primary) hypertension: Secondary | ICD-10-CM | POA: Diagnosis not present

## 2020-09-04 DIAGNOSIS — H40053 Ocular hypertension, bilateral: Secondary | ICD-10-CM

## 2020-09-04 DIAGNOSIS — H35033 Hypertensive retinopathy, bilateral: Secondary | ICD-10-CM

## 2020-09-04 MED ORDER — AFLIBERCEPT 2MG/0.05ML IZ SOLN FOR KALEIDOSCOPE
2.0000 mg | INTRAVITREAL | Status: AC | PRN
  Administered 2020-09-04: 2 mg via INTRAVITREAL

## 2020-09-10 ENCOUNTER — Ambulatory Visit: Payer: Medicare HMO | Admitting: Family Medicine

## 2020-09-18 ENCOUNTER — Ambulatory Visit (INDEPENDENT_AMBULATORY_CARE_PROVIDER_SITE_OTHER): Payer: Medicare HMO | Admitting: Gastroenterology

## 2020-09-18 ENCOUNTER — Encounter: Payer: Self-pay | Admitting: Gastroenterology

## 2020-09-18 VITALS — BP 190/84 | HR 64 | Ht 67.0 in | Wt 148.6 lb

## 2020-09-18 DIAGNOSIS — K529 Noninfective gastroenteritis and colitis, unspecified: Secondary | ICD-10-CM | POA: Diagnosis not present

## 2020-09-18 DIAGNOSIS — R634 Abnormal weight loss: Secondary | ICD-10-CM | POA: Diagnosis not present

## 2020-09-18 DIAGNOSIS — R1310 Dysphagia, unspecified: Secondary | ICD-10-CM | POA: Diagnosis not present

## 2020-09-18 DIAGNOSIS — E119 Type 2 diabetes mellitus without complications: Secondary | ICD-10-CM | POA: Diagnosis not present

## 2020-09-18 DIAGNOSIS — Z794 Long term (current) use of insulin: Secondary | ICD-10-CM

## 2020-09-18 MED ORDER — PLENVU 140 G PO SOLR: 1.0000 | ORAL | 0 refills | Status: DC

## 2020-09-18 NOTE — Progress Notes (Signed)
09/18/2020 [name] [id number] 14-Apr-1946   HISTORY OF PRESENT ILLNESS: This is a 74 year old female who has past medical history of breast cancer in 2017, history of CVA, some early dementia, insulin-dependent diabetes mellitus.  She presents here today with her sister for complaints of diarrhea.  Her sister reports that she has been living with her for the past 6 months and has had diarrhea at least since that time.  The patient is unable to quantify exactly how long.  She had C. difficile back in May and was treated with vancomycin.  Repeat C. difficile stool study in August was negative.  She denies of any abdominal pain or rectal bleeding.  They report that she is having fecal incontinence even at nighttime while she is asleep.  They say her appetite is good and she eats well, but has lost about 15 pounds.  She uses Imodium, which helps to control the diarrhea.  She is on Aricept, but I did not ask her how long she has been on this medication.  They report that her stools sometimes are darker in color, but she is on ferrous sulfate daily.  They also report some issues with swallowing.  The patient notes that on one occasion she had some food get hung up when she was eating a hamburger.  Her sister notices that she sometimes seems to cough while she is drinking even water.  Her sister has really never taken noticed that she has had issues with food getting stuck.  It appears that she had a colonoscopy in March 2017 by Dr. Carlos Levering at [facility].  She was found to have one polyp that was removed and was a tubular adenoma.  Also had diverticulosis.  This was performed for history of colon polyps and it does not appear that she was complaining of diarrhea at that time.   Past Medical History:  Diagnosis Date  . Breast cancer ([place])    2017  . CVA (cerebral vascular accident) ([place]) 11/2018  . Dehydration 07/2020  . Diabetes mellitus without complication ([place])   . Diabetic retinopathy ([place])     PDR OU  . Frequent diarrhea 07/2020  . Hyperkalemia 07/2020  . Hypertensive retinopathy    OU  . Stroke [facility])    Past Surgical History:  Procedure Laterality Date  . CATARACT EXTRACTION    . EYE SURGERY    . MASTECTOMY Bilateral    "2017" Left Mastectomy; "2019" Right Mastectomy  . PACEMAKER IMPLANT  2017    reports that she has quit smoking. She has never used smokeless tobacco. She reports previous alcohol use. She reports that she does not use drugs. family history includes Cancer in her mother; Diabetes in her brother and sister; Heart disease in her father. Allergies  Allergen Reactions  . Amlodipine Nausea And Vomiting    Pt refuses to take due to severe n/v that began after starting amlodipine and stopped after discontinuation.      Outpatient Encounter Medications as of 09/18/2020  Medication Sig  . Accu-Chek FastClix Lancets MISC USE AS DIRECTED TO TEST BLOOD GLUCOSE DAILY  . aspirin EC 81 MG tablet Take by mouth.  Marland Kitchen atorvastatin (LIPITOR) 80 MG tablet Take by mouth.  . Blood Glucose Monitoring Suppl (FIFTY50 GLUCOSE METER 2.0) w/Device KIT Use to check blood sugars. Freestyle glucose meter. Okay to substitute for other meter preferred by insurance. ICD-10 E11.9.  Marland Kitchen carvedilol (COREG) 25 MG tablet Take by mouth.  . donepezil (ARICEPT) 10 MG  tablet Take 10 mg by mouth at bedtime.  . dorzolamide-timolol (COSOPT) 22.3-6.8 MG/ML ophthalmic solution INSTILL 1 DROP INTO BOTH EYES TWICE A DAY  . Insulin Pen Needle (FIFTY50 PEN NEEDLES) 32G X 4 MM MISC Needles for insulin pen. Inject as instructed E11.9  . KLOR-CON M20 20 MEQ tablet Take 20 mEq by mouth daily.  Marland Kitchen latanoprost (XALATAN) 0.005 % ophthalmic solution Place 1 drop into the right eye at bedtime. (Patient not taking: Reported on 08/07/2020)  . loperamide (IMODIUM A-D) 2 MG tablet Take 1 tablet (2 mg total) by mouth 4 (four) times daily as needed for diarrhea or loose stools.  . [DISCONTINUED] busPIRone (BUSPAR) 5 MG  tablet Take 1 tablet (5 mg total) by mouth daily.  . [DISCONTINUED] enalapril (VASOTEC) 5 MG tablet Take 5 mg by mouth daily.  . [DISCONTINUED] insulin lispro (HUMALOG) 100 UNIT/ML injection Inject into the skin.   . [DISCONTINUED] insulin NPH-regular Human (70-30) 100 UNIT/ML injection Inject into the skin. (Patient not taking: Reported on 08/07/2020)  . [DISCONTINUED] lisinopril (PRINIVIL,ZESTRIL) 20 MG tablet Take 10 mg by mouth daily.   . [DISCONTINUED] meclizine (ANTIVERT) 25 MG tablet Take 1 tablet (25 mg total) by mouth 3 (three) times daily as needed for dizziness. (Patient not taking: Reported on 08/07/2020)  . [DISCONTINUED] NIFEdipine (PROCARDIA XL/NIFEDICAL XL) 60 MG 24 hr tablet Take 60 mg by mouth daily.   . [DISCONTINUED] ondansetron (ZOFRAN) 4 MG tablet Take 4 mg by mouth every 6 (six) hours as needed.  (Patient not taking: Reported on 08/07/2020)   Facility-Administered Encounter Medications as of 09/18/2020  Medication  . triamcinolone acetonide (KENALOG-40) injection 4 mg  . triamcinolone acetonide (KENALOG-40) injection 4 mg     REVIEW OF SYSTEMS  : All other systems reviewed and negative except where noted in the History of Present Illness.   PHYSICAL EXAM: Ht 5' 7"  (1.702 m)   Wt 148 lb 9.6 oz (67.4 kg)   BMI 23.27 kg/m  General: Well developed AA female in no acute distress Head: Normocephalic and atraumatic Eyes:  Sclerae anicteric, conjunctiva pink. Ears: Normal auditory acuity Lungs: Clear throughout to auscultation; no W/R/R. Heart: Regular rate and rhythm; no M/R/G. Abdomen: Soft, non-distended.  BS present.  Non-tender. Rectal:  Will be done at the time of colonoscopy. Musculoskeletal: Symmetrical with no gross deformities  Skin: No lesions on visible extremities Extremities: No edema  Neurological: Alert oriented x 4, grossly non-focal Psychological:  Alert and cooperative. Normal mood and affect  ASSESSMENT AND PLAN: *Diarrhea:  For several (at least  6) months.  Had Cdiff back in May, but stool study was negative in August.  ? If it is medication side effect from Aricept (I did not ask how long she has been on this).  We will recheck stool for C. difficile.  I am also going to order a fecal pancreatic elastase.  As long as C. difficile is negative we will plan for colonoscopy to rule out microscopic colitis, etc.  If colonoscopy is unrevealing and then may need to consider discontinuing Aricept for a while to see if the diarrhea resolves. *Weight loss:  About 15 pounds recently.  If endoscopic evaluation is unremarkable then may need CT scan of the chest/abdomen/pelvis. *Dysphagia:  Sounds like it is mostly to liquids, only occasional issues with swallowing solid food.  Sounds more oropharyngeal.  Since we are doing colonoscopy and in light of her complaints of weight loss as well, we will plan for EGD also.  If  EGD is negative then she will likely need a MBSS. *IDDM:  Insulin will be adjusted prior to endoscopic procedure per protocol. Will resume normal dosing after procedure.  **EGD and colonoscopy scheduled with Dr. Silverio Decamp.  The risks, benefits, and alternatives to EGD and colonoscopy were discussed with the patient and she consents to proceed.   CC:  [name], FNP

## 2020-09-18 NOTE — Patient Instructions (Signed)
If you are age 76 or older, your body mass index should be between 23-30. Your Body mass index is 23.27 kg/m. If this is out of the aforementioned range listed, please consider follow up with your Primary Care Provider.  If you are age 68 or younger, your body mass index should be between 19-25. Your Body mass index is 23.27 kg/m. If this is out of the aformentioned range listed, please consider follow up with your Primary Care Provider.   Your provider has requested that you go to the basement level for lab work before leaving today. Press "B" on the elevator. The lab is located at the first door on the left as you exit the elevator.  You have been scheduled for an endoscopy and colonoscopy. Please follow the written instructions given to you at your visit today. Please pick up your prep supplies at the pharmacy within the next 1-3 days. If you use inhalers (even only as needed), please bring them with you on the day of your procedure.  START a probiotic daily such as Florastor.  Follow up pending the results of your procedure.

## 2020-09-24 ENCOUNTER — Other Ambulatory Visit: Payer: Medicare HMO

## 2020-09-24 DIAGNOSIS — K529 Noninfective gastroenteritis and colitis, unspecified: Secondary | ICD-10-CM

## 2020-09-24 DIAGNOSIS — R634 Abnormal weight loss: Secondary | ICD-10-CM

## 2020-09-24 DIAGNOSIS — E119 Type 2 diabetes mellitus without complications: Secondary | ICD-10-CM

## 2020-09-26 LAB — CLOSTRIDIUM DIFFICILE EIA: C difficile Toxins A+B, EIA: NEGATIVE

## 2020-09-28 LAB — PANCREATIC ELASTASE, FECAL: Pancreatic Elastase-1, Stool: 454 mcg/g

## 2020-09-30 NOTE — Progress Notes (Signed)
[facility] Note  10/03/2020     CHIEF COMPLAINT Patient presents for Retina Follow Up   HISTORY OF PRESENT ILLNESS: Desiree Pollard is a 74 y.o. female who presents to the clinic today for:   HPI    Retina Follow Up    Patient presents with  Diabetic Retinopathy.  In both eyes.  This started 4 weeks ago.  I, the attending physician,  performed the HPI with the patient and updated documentation appropriately.          Comments    Patient here for 4 weeks retina follow up for PDR OU. Patient states vision about the same. Every no and then has eye pain. Saw eye doctor at [facility]. Had laser on OD. No eye drops.       Last edited by Bernarda Caffey, MD on 10/03/2020 10:22 AM. (History)    pt states she had a laser procedure in her right eye for IOP at [facility] on 10.15.21   Referring physician: No referring provider defined for this encounter.  HISTORICAL INFORMATION:   Selected notes from the MEDICAL RECORD NUMBER Referred by Dr. Angelena Form for DM exam LEE: 02.04.20 (A. Hager) [BCVA: OD: 20/400 OS: 20/150] Ocular Hx-ptosis OU, pseudo OU, diabetic ret OU PMH-hx of stroke (1 month), cancer, DM    CURRENT MEDICATIONS: Current Outpatient Medications (Ophthalmic Drugs)  Medication Sig  . dorzolamide-timolol (COSOPT) 22.3-6.8 MG/ML ophthalmic solution INSTILL 1 DROP INTO BOTH EYES TWICE A DAY  . latanoprost (XALATAN) 0.005 % ophthalmic solution Place 1 drop into the right eye at bedtime.   No current facility-administered medications for this visit. (Ophthalmic Drugs)   Current Outpatient Medications (Other)  Medication Sig  . Accu-Chek FastClix Lancets MISC USE AS DIRECTED TO TEST BLOOD GLUCOSE DAILY  . aspirin EC 81 MG tablet Take by mouth.  Marland Kitchen atorvastatin (LIPITOR) 80 MG tablet Take by mouth.  . Blood Glucose Monitoring Suppl (FIFTY50 GLUCOSE METER 2.0) w/Device KIT Use to check blood sugars. Freestyle glucose meter. Okay to substitute for  other meter preferred by insurance. ICD-10 E11.9.  Marland Kitchen carvedilol (COREG) 25 MG tablet Take 25 mg by mouth 2 (two) times daily with a meal.   . donepezil (ARICEPT) 10 MG tablet Take 10 mg by mouth at bedtime.  . ferrous sulfate 325 (65 FE) MG tablet Take 325 mg by mouth daily with breakfast.  . insulin glargine (LANTUS) 100 UNIT/ML injection Inject 4 Units into the skin daily.  . Insulin Pen Needle (FIFTY50 PEN NEEDLES) 32G X 4 MM MISC Needles for insulin pen. Inject as instructed E11.9  . KLOR-CON M20 20 MEQ tablet Take 20 mEq by mouth 2 (two) times daily.   Marland Kitchen lisinopril (ZESTRIL) 10 MG tablet Take 10 mg by mouth daily.  Marland Kitchen loperamide (IMODIUM A-D) 2 MG tablet Take 1 tablet (2 mg total) by mouth 4 (four) times daily as needed for diarrhea or loose stools.  Marland Kitchen NIFEdipine (PROCARDIA-XL/NIFEDICAL-XL) 30 MG 24 hr tablet Take 30 mg by mouth daily.  Marland Kitchen PEG-KCl-NaCl-NaSulf-Na Asc-C (PLENVU) 140 g SOLR Take 1 kit by mouth as directed.   No current facility-administered medications for this visit. (Other)   REVIEW OF SYSTEMS: ROS    Positive for: Neurological, Musculoskeletal, Endocrine, Cardiovascular, Eyes, Heme/Lymph   Negative for: Constitutional, Gastrointestinal, Skin, Genitourinary, HENT, Respiratory, Psychiatric, Allergic/Imm   Last edited by Theodore Demark, COA on 10/03/2020  9:40 AM. (History)     ALLERGIES Allergies  Allergen Reactions  .  Amlodipine Nausea And Vomiting    Pt refuses to take due to severe n/v that began after starting amlodipine and stopped after discontinuation.   PAST MEDICAL HISTORY Past Medical History:  Diagnosis Date  . Breast cancer ([place])    2017  . CVA (cerebral vascular accident) ([place]) 11/2018  . Dehydration 07/2020  . Diabetes mellitus without complication ([place])   . Diabetic retinopathy ([place])    PDR OU  . Frequent diarrhea 07/2020  . Hyperkalemia 07/2020  . Hypertensive retinopathy    OU  . Stroke [facility])    Past Surgical History:  Procedure  Laterality Date  . CATARACT EXTRACTION Right   . MASTECTOMY Bilateral    "2017" Left Mastectomy; "2019" Right Mastectomy  . PACEMAKER IMPLANT  2017   FAMILY HISTORY Family History  Problem Relation Age of Onset  . Ovarian cancer Mother   . Heart disease Father   . Diabetes Sister   . Diabetes Brother   . Colon cancer Neg Hx   . Esophageal cancer Neg Hx   . Pancreatic cancer Neg Hx   . Stomach cancer Neg Hx   . Liver disease Neg Hx    SOCIAL HISTORY Social History   Tobacco Use  . Smoking status: Former Research scientist (life sciences)  . Smokeless tobacco: Never Used  Vaping Use  . Vaping Use: Never used  Substance Use Topics  . Alcohol use: Not Currently  . Drug use: Never         OPHTHALMIC EXAM:  Base Eye Exam    Visual Acuity (Snellen - Linear)      Right Left   Dist [place] LP 20/100 -1   Dist ph [place] NI NI       Tonometry (Tonopen, 9:36 AM)      Right Left   Pressure 17 15       Pupils      Dark Light Shape React APD   Right 2 1 Round Minimal None   Left 2 1 Round Minimal None       Visual Fields (Counting fingers)      Left Right    Full    Restrictions  Total superior temporal, superior nasal, inferior nasal deficiencies       Extraocular Movement      Right Left    Full Full       Neuro/Psych    Oriented x3: Yes   Mood/Affect: Normal       Dilation    Both eyes: 1.0% Mydriacyl, 2.5% Phenylephrine @ 9:36 AM        Slit Lamp and Fundus Exam    Slit Lamp Exam      Right Left   Lids/Lashes Dermatochalasis - upper lid, nasal Ectropion - lower lid Dermatochalasis - upper lid, Meibomian gland dysfunction, mild, nasal Ectropion, lower lid   Conjunctiva/Sclera nasal and temporal Pinguecula, Melanosis nasal and temporal Pinguecula, Melanosis   Cornea mild Arcus, Well healed cataract wounds, tear film debris, 2+ PEE mild Arcus, Well healed cataract wounds, 2+ Punctate epithelial erosions, tear film debris   Anterior Chamber deep and clear deep and clear   Iris Round  and moderately dilated to 5 mm Round and moderately dilated to 30m, No NVI   Lens Posterior chamber intraocular lens, trace Posterior capsular opacification Posterior chamber intraocular lens, trace Posterior capsular opacification   Vitreous Vitreous syneresis, mild residual white VH inferiorly Vitreous syneresis       Fundus Exam      Right Left  Disc 3-4+ pallor, sharp rim, +atrophy 2+pallor, sharp, fine NVD - regressing, sharp rim, focal heme at 0300   C/D Ratio 0.9 0.1   Macula Blunted foveal reflex, scattered MA, +ERM, central edema--increased, atrophic and ischemic Blunted foveal reflex, interval improvement in large subhyaloid boat-shaped heme partially obscuring small portion of inferior macula, persistent edema, scattered IRH   Vessels Severe Vascular attenuation, Tortuous, +fibrosis along ST arcades--improved severe Vascular attenuation, Tortuous, +NV   Periphery Attached, scattered IRH/MA, good 360 PRP fill in Attached, scattered IRH and pre-retinal heme, 360 PRP w/room for fill-in posteriorly          IMAGING AND PROCEDURES  Imaging and Procedures for @TODAY @  OCT, Retina - OU - Both Eyes       Right Eye Quality was good. Central Foveal Thickness: 485. Progression has worsened. Findings include abnormal foveal contour, intraretinal fluid, intraretinal hyper-reflective material, no SRF, outer retinal atrophy, epiretinal membrane, macular pucker (Interval increase in central IRF).   Left Eye Quality was good. Central Foveal Thickness: 855. Progression has improved. Findings include abnormal foveal contour, intraretinal fluid, epiretinal membrane, no SRF, preretinal fibrosis, vitreous traction (Interval improvement in subhyaloid heme and vitreous opacities, persistent traction).   Notes *Images captured and stored on drive  Diagnosis / Impression:  DME OU Mild ERM OU OD: Interval increase in central IRF OS: Interval improvement in subhyaloid heme and vitreous  opacities, persistent traction   Clinical management:  See below  Abbreviations: NFP - Normal foveal profile. CME - cystoid macular edema. PED - pigment epithelial detachment. IRF - intraretinal fluid. SRF - subretinal fluid. EZ - ellipsoid zone. ERM - epiretinal membrane. ORA - outer retinal atrophy. ORT - outer retinal tubulation. SRHM - subretinal hyper-reflective material        Intravitreal Injection, Pharmacologic Agent - OD - Right Eye       Time Out 10/03/2020. 10:24 AM. Confirmed correct patient, procedure, site, and patient consented.   Anesthesia Topical anesthesia was used. Anesthetic medications included Lidocaine 2%, Proparacaine 0.5%.   Procedure Preparation included 5% betadine to ocular surface, eyelid speculum. A (32g) needle was used.   Injection:  2 mg aflibercept Alfonse Flavors) SOLN   NDC: A3590391, Lot: [id number], Expiration date: 12/14/2020   Route: Intravitreal, Site: Right Eye, Waste: 0.05 mL  Post-op Post injection exam found visual acuity of at least counting fingers. The patient tolerated the procedure well. There were no complications. The patient received written and verbal post procedure care education. Post injection medications were not given.   Notes An AC tap was performed following injection due to elevated IOP using a 30 gauge needle on a syringe with the plunger removed. The needle was placed at the limbus at 7 oclock and approximately 0.08cc of aqueous was removed from the anterior chamber. Betadine was applied to the tap area before and after the paracentesis was performed. There were no complications. The patient tolerated the procedure well. The IOP was rechecked and was found to be ~8 mmHg by palpation.        Intravitreal Injection, Pharmacologic Agent - OS - Left Eye       Time Out 10/03/2020. 10:25 AM. Confirmed correct patient, procedure, site, and patient consented.   Anesthesia Topical anesthesia was used. Anesthetic  medications included Lidocaine 2%, Proparacaine 0.5%.   Procedure Preparation included 5% betadine to ocular surface, eyelid speculum. A (32g) needle was used.   Injection:  2 mg aflibercept Alfonse Flavors) SOLN   NDC: A3590391, Lot: [id number], Expiration date: 08/14/2021  Route: Intravitreal, Site: Left Eye, Waste: 0.05 mL  Post-op Post injection exam found visual acuity of at least counting fingers. The patient tolerated the procedure well. There were no complications. The patient received written and verbal post procedure care education. Post injection medications were not given.                 ASSESSMENT/PLAN:    ICD-10-CM   1. Proliferative diabetic retinopathy of both eyes with macular edema associated with type 2 diabetes mellitus (HCC)  K12.2449 Intravitreal Injection, Pharmacologic Agent - OD - Right Eye    Intravitreal Injection, Pharmacologic Agent - OS - Left Eye    aflibercept (EYLEA) SOLN 2 mg    aflibercept (EYLEA) SOLN 2 mg  2. 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 Essential hypertension  I10   5. Hypertensive retinopathy of both eyes  H35.033   6. Pseudophakia of both eyes  Z96.1   7. History of stroke  Z86.73   8. Primary open angle glaucoma (POAG) of right eye, severe stage  H40.1113   9. Ocular hypertension of left eye  H40.052     1,2. Proliferative diabetic retinopathy w/ DME OU  - lost to f/u from 11/2019 to 06/2020 due to living out of the area in [place] and unable to return  - lost to f/u 3.23.2020 to 7.13.2020 due to [place] restrictions  - history of VH OD onset 2.25.20 -- improved  - at initial presentation - difficult exam due to poor patient cooperation -- history of stroke -- improved now  - initial exam showed central edema OU and scattered IRH; +fine NVD OS; early flat NVE OD  - FA (08.19.20) shows NV nasal to disc OD; OS with NV along SN and ST arcades  - s/p PRP  OS (02.18.20), fill-in (08.19.20)  - s/p PRP OD (03.23.30), fill-in (09.16.20)  - s/p IVTA OU #1 (02.25.20), #2 (03.23.20) -- lost to f/u due to COVID restrictions  - s/p IVA OU #1 (07.31.20), #2 (09.02.20), #3 (10.07.20), #4 (11.04.20)             - s/p IVA OS #5 (7.27.21)             - s/p IVE OD #1 (12.02.20), #2 (09.22.21)  - s/p IVE OS #1 (12.02.21), #2 (08.24.21), #3 (09.22.21)   - IOP 23 OD, 13 OS today -- on Cosopt and brimonidine TID OD + latanoprost QHS OD  - BCVA: decreased to LP from CF OD, improved to 20/100 from 20/150 OS -- suspect glaucomatous vision loss (OD disc w/ severe pallor)  - OCT shows interval increase in central IRF OD, OS: Interval improvement in subhyaloid heme and vitreous opacities; persistent central vitreous traction  - recommend IVE OD #3 and OS #4 today, 10.21.21  - pt wishes to proceed  - RBA of procedure discussed, questions answered  - informed consent obtained and signed  - see procedure note -- AC tap OD  - Eylea4U benefits investigation started 11.4.20 -- approved for 2021  - f/u 4 weeks, DFE, OCT, possible injection  3. VMT OS  - stable VMT, contributing to central edema OS  - monitor  4,5. Hypertensive retinopathy OU  - discussed importance of tight BP control and its relation to 1,2 above  - monitor  6. Pseudophakia OU  - s/p CE/IOL OU  - doing well  - monitor  7. History of stroke  - ischemic stroke on 12/26/2018  - pt reports some visual decline following stroke   8,9. POAG, severe stage OD; Ocular Hypertension OS  - history of steroid response  - under the expert management of [place]  - s/p SLT OD at [facility] 10.15.21  - IOP good at 17 OD today, 10.22.21  - disc OD pale/atrophic and BCVA down to LP  - suspect glaucomatous vision loss since last visit in 2020   - continue Cosopt and brimonidine BID OD  Ophthalmic Meds Ordered this visit:  Meds ordered this encounter  Medications  . aflibercept (EYLEA) SOLN 2 mg   . aflibercept (EYLEA) SOLN 2 mg     Return in about 4 weeks (around 10/31/2020) for f/u PDR OU, DFE, OCT.  There are no Patient Instructions on file for this visit.  Explained the diagnoses, plan, and follow up with the patient and they expressed understanding.  Patient expressed understanding of the importance of proper follow up care.   This document serves as a record of services personally performed by Gardiner Sleeper, MD, PhD. It was created on their behalf by Leonie Douglas, an ophthalmic technician. The creation of this record is the provider's dictation and/or activities during the visit.    Electronically signed by: Leonie Douglas COA, 10/03/20  11:11 AM  This document serves as a record of services personally performed by Gardiner Sleeper, MD, PhD. It was created on their behalf by San Jetty. Owens Shark, OA an ophthalmic technician. The creation of this record is the provider's dictation and/or activities during the visit.    Electronically signed by: San Jetty. [place], [place] 10.21.2021 11:11 AM  Gardiner Sleeper, M.D., Ph.D. Diseases & Surgery of the Retina and Vitreous [facility]  I have reviewed the above documentation for accuracy and completeness, and I agree with the above. Gardiner Sleeper, M.D., Ph.D. 10/03/20 11:11 AM  Abbreviations: M myopia (nearsighted); A astigmatism; H hyperopia (farsighted); P presbyopia; Mrx spectacle prescription;  CTL contact lenses; OD right eye; OS left eye; OU both eyes  XT exotropia; ET esotropia; PEK punctate epithelial keratitis; PEE punctate epithelial erosions; DES dry eye syndrome; MGD meibomian gland dysfunction; ATs artificial tears; PFAT's preservative free artificial tears; [place] nuclear sclerotic cataract; PSC posterior subcapsular cataract; ERM epi-retinal membrane; PVD posterior vitreous detachment; RD retinal detachment; DM diabetes mellitus; DR diabetic retinopathy; NPDR non-proliferative diabetic retinopathy; PDR  proliferative diabetic retinopathy; CSME clinically significant macular edema; DME diabetic macular edema; dbh dot blot hemorrhages; CWS cotton wool spot; POAG primary open angle glaucoma; C/D cup-to-disc ratio; HVF humphrey visual field; GVF goldmann visual field; OCT optical coherence tomography; IOP intraocular pressure; BRVO Branch retinal vein occlusion; CRVO central retinal vein occlusion; CRAO central retinal artery occlusion; BRAO branch retinal artery occlusion; RT retinal tear; SB scleral buckle; PPV pars plana vitrectomy; VH Vitreous hemorrhage; PRP panretinal laser photocoagulation; IVK intravitreal kenalog; VMT vitreomacular traction; MH Macular hole;  NVD neovascularization of the disc; NVE neovascularization elsewhere; AREDS age related eye disease study; ARMD age related macular degeneration; POAG primary open angle glaucoma; EBMD epithelial/anterior basement membrane dystrophy; ACIOL anterior chamber intraocular lens; IOL intraocular lens; PCIOL posterior chamber intraocular lens; Phaco/IOL phacoemulsification with intraocular lens placement; [place] photorefractive keratectomy; LASIK laser assisted in situ keratomileusis; HTN hypertension; DM diabetes mellitus; COPD chronic obstructive pulmonary disease

## 2020-10-03 ENCOUNTER — Other Ambulatory Visit: Payer: Self-pay

## 2020-10-03 ENCOUNTER — Ambulatory Visit (INDEPENDENT_AMBULATORY_CARE_PROVIDER_SITE_OTHER): Payer: Medicare HMO | Admitting: Ophthalmology

## 2020-10-03 ENCOUNTER — Encounter (INDEPENDENT_AMBULATORY_CARE_PROVIDER_SITE_OTHER): Payer: Self-pay | Admitting: Ophthalmology

## 2020-10-03 VITALS — BP 169/79

## 2020-10-03 DIAGNOSIS — H40053 Ocular hypertension, bilateral: Secondary | ICD-10-CM

## 2020-10-03 DIAGNOSIS — H3581 Retinal edema: Secondary | ICD-10-CM | POA: Diagnosis not present

## 2020-10-03 DIAGNOSIS — I10 Essential (primary) hypertension: Secondary | ICD-10-CM | POA: Diagnosis not present

## 2020-10-03 DIAGNOSIS — H35033 Hypertensive retinopathy, bilateral: Secondary | ICD-10-CM

## 2020-10-03 DIAGNOSIS — H40052 Ocular hypertension, left eye: Secondary | ICD-10-CM

## 2020-10-03 DIAGNOSIS — H401113 Primary open-angle glaucoma, right eye, severe stage: Secondary | ICD-10-CM

## 2020-10-03 DIAGNOSIS — Z961 Presence of intraocular lens: Secondary | ICD-10-CM

## 2020-10-03 DIAGNOSIS — E113513 Type 2 diabetes mellitus with proliferative diabetic retinopathy with macular edema, bilateral: Secondary | ICD-10-CM

## 2020-10-03 DIAGNOSIS — H43822 Vitreomacular adhesion, left eye: Secondary | ICD-10-CM

## 2020-10-03 DIAGNOSIS — Z8673 Personal history of transient ischemic attack (TIA), and cerebral infarction without residual deficits: Secondary | ICD-10-CM

## 2020-10-03 MED ORDER — AFLIBERCEPT 2MG/0.05ML IZ SOLN FOR KALEIDOSCOPE
2.0000 mg | INTRAVITREAL | Status: AC | PRN
  Administered 2020-10-03: 2 mg via INTRAVITREAL

## 2020-10-03 NOTE — Progress Notes (Signed)
Reviewed and agree with documentation and assessment and plan. K. Veena Thermon Zulauf , MD   

## 2020-10-09 ENCOUNTER — Other Ambulatory Visit: Payer: Self-pay | Admitting: Nephrology

## 2020-10-09 DIAGNOSIS — N184 Chronic kidney disease, stage 4 (severe): Secondary | ICD-10-CM

## 2020-10-18 ENCOUNTER — Encounter (HOSPITAL_COMMUNITY): Payer: Self-pay

## 2020-10-18 ENCOUNTER — Emergency Department (HOSPITAL_COMMUNITY): Payer: Medicare HMO

## 2020-10-18 ENCOUNTER — Inpatient Hospital Stay (HOSPITAL_COMMUNITY)
Admission: EM | Admit: 2020-10-18 | Discharge: 2020-10-28 | DRG: 065 | Disposition: A | Discharge status: Skilled Nursing Facility | Payer: Medicare HMO | Attending: Internal Medicine | Admitting: Internal Medicine

## 2020-10-18 ENCOUNTER — Other Ambulatory Visit: Payer: Self-pay

## 2020-10-18 DIAGNOSIS — Z833 Family history of diabetes mellitus: Secondary | ICD-10-CM

## 2020-10-18 DIAGNOSIS — N179 Acute kidney failure, unspecified: Secondary | ICD-10-CM | POA: Diagnosis present

## 2020-10-18 DIAGNOSIS — D631 Anemia in chronic kidney disease: Secondary | ICD-10-CM | POA: Diagnosis present

## 2020-10-18 DIAGNOSIS — I10 Essential (primary) hypertension: Secondary | ICD-10-CM | POA: Diagnosis present

## 2020-10-18 DIAGNOSIS — I129 Hypertensive chronic kidney disease with stage 1 through stage 4 chronic kidney disease, or unspecified chronic kidney disease: Secondary | ICD-10-CM | POA: Diagnosis present

## 2020-10-18 DIAGNOSIS — E1151 Type 2 diabetes mellitus with diabetic peripheral angiopathy without gangrene: Secondary | ICD-10-CM | POA: Diagnosis present

## 2020-10-18 DIAGNOSIS — Z8041 Family history of malignant neoplasm of ovary: Secondary | ICD-10-CM

## 2020-10-18 DIAGNOSIS — Z950 Presence of cardiac pacemaker: Secondary | ICD-10-CM

## 2020-10-18 DIAGNOSIS — I672 Cerebral atherosclerosis: Secondary | ICD-10-CM | POA: Diagnosis present

## 2020-10-18 DIAGNOSIS — R42 Dizziness and giddiness: Secondary | ICD-10-CM | POA: Diagnosis not present

## 2020-10-18 DIAGNOSIS — I422 Other hypertrophic cardiomyopathy: Secondary | ICD-10-CM | POA: Diagnosis present

## 2020-10-18 DIAGNOSIS — H6121 Impacted cerumen, right ear: Secondary | ICD-10-CM | POA: Diagnosis present

## 2020-10-18 DIAGNOSIS — Z87891 Personal history of nicotine dependence: Secondary | ICD-10-CM

## 2020-10-18 DIAGNOSIS — D696 Thrombocytopenia, unspecified: Secondary | ICD-10-CM

## 2020-10-18 DIAGNOSIS — I6381 Other cerebral infarction due to occlusion or stenosis of small artery: Secondary | ICD-10-CM | POA: Diagnosis not present

## 2020-10-18 DIAGNOSIS — Z888 Allergy status to other drugs, medicaments and biological substances status: Secondary | ICD-10-CM

## 2020-10-18 DIAGNOSIS — I639 Cerebral infarction, unspecified: Secondary | ICD-10-CM

## 2020-10-18 DIAGNOSIS — I952 Hypotension due to drugs: Secondary | ICD-10-CM | POA: Diagnosis present

## 2020-10-18 DIAGNOSIS — E860 Dehydration: Secondary | ICD-10-CM | POA: Diagnosis present

## 2020-10-18 DIAGNOSIS — E785 Hyperlipidemia, unspecified: Secondary | ICD-10-CM | POA: Diagnosis present

## 2020-10-18 DIAGNOSIS — I442 Atrioventricular block, complete: Secondary | ICD-10-CM | POA: Diagnosis present

## 2020-10-18 DIAGNOSIS — Z9013 Acquired absence of bilateral breasts and nipples: Secondary | ICD-10-CM

## 2020-10-18 DIAGNOSIS — Z79899 Other long term (current) drug therapy: Secondary | ICD-10-CM

## 2020-10-18 DIAGNOSIS — Z20822 Contact with and (suspected) exposure to covid-19: Secondary | ICD-10-CM | POA: Diagnosis present

## 2020-10-18 DIAGNOSIS — Z8673 Personal history of transient ischemic attack (TIA), and cerebral infarction without residual deficits: Secondary | ICD-10-CM

## 2020-10-18 DIAGNOSIS — K529 Noninfective gastroenteritis and colitis, unspecified: Secondary | ICD-10-CM | POA: Diagnosis present

## 2020-10-18 DIAGNOSIS — Z8249 Family history of ischemic heart disease and other diseases of the circulatory system: Secondary | ICD-10-CM

## 2020-10-18 DIAGNOSIS — Z853 Personal history of malignant neoplasm of breast: Secondary | ICD-10-CM

## 2020-10-18 DIAGNOSIS — N3091 Cystitis, unspecified with hematuria: Secondary | ICD-10-CM | POA: Diagnosis not present

## 2020-10-18 DIAGNOSIS — E119 Type 2 diabetes mellitus without complications: Secondary | ICD-10-CM

## 2020-10-18 DIAGNOSIS — Z794 Long term (current) use of insulin: Secondary | ICD-10-CM

## 2020-10-18 DIAGNOSIS — I69354 Hemiplegia and hemiparesis following cerebral infarction affecting left non-dominant side: Secondary | ICD-10-CM

## 2020-10-18 DIAGNOSIS — E113513 Type 2 diabetes mellitus with proliferative diabetic retinopathy with macular edema, bilateral: Secondary | ICD-10-CM | POA: Diagnosis present

## 2020-10-18 DIAGNOSIS — B9620 Unspecified Escherichia coli [E. coli] as the cause of diseases classified elsewhere: Secondary | ICD-10-CM | POA: Diagnosis not present

## 2020-10-18 DIAGNOSIS — N184 Chronic kidney disease, stage 4 (severe): Secondary | ICD-10-CM | POA: Diagnosis present

## 2020-10-18 DIAGNOSIS — G9389 Other specified disorders of brain: Secondary | ICD-10-CM | POA: Diagnosis present

## 2020-10-18 DIAGNOSIS — E876 Hypokalemia: Secondary | ICD-10-CM | POA: Diagnosis present

## 2020-10-18 DIAGNOSIS — Z7982 Long term (current) use of aspirin: Secondary | ICD-10-CM

## 2020-10-18 IMAGING — DX DG CHEST 1V PORT
1 series · 1 of 1 positions shown · non-contrast
Comparison: None.

CLINICAL DATA: Dizziness

EXAM:
PORTABLE CHEST 1 VIEW

[chest ap]
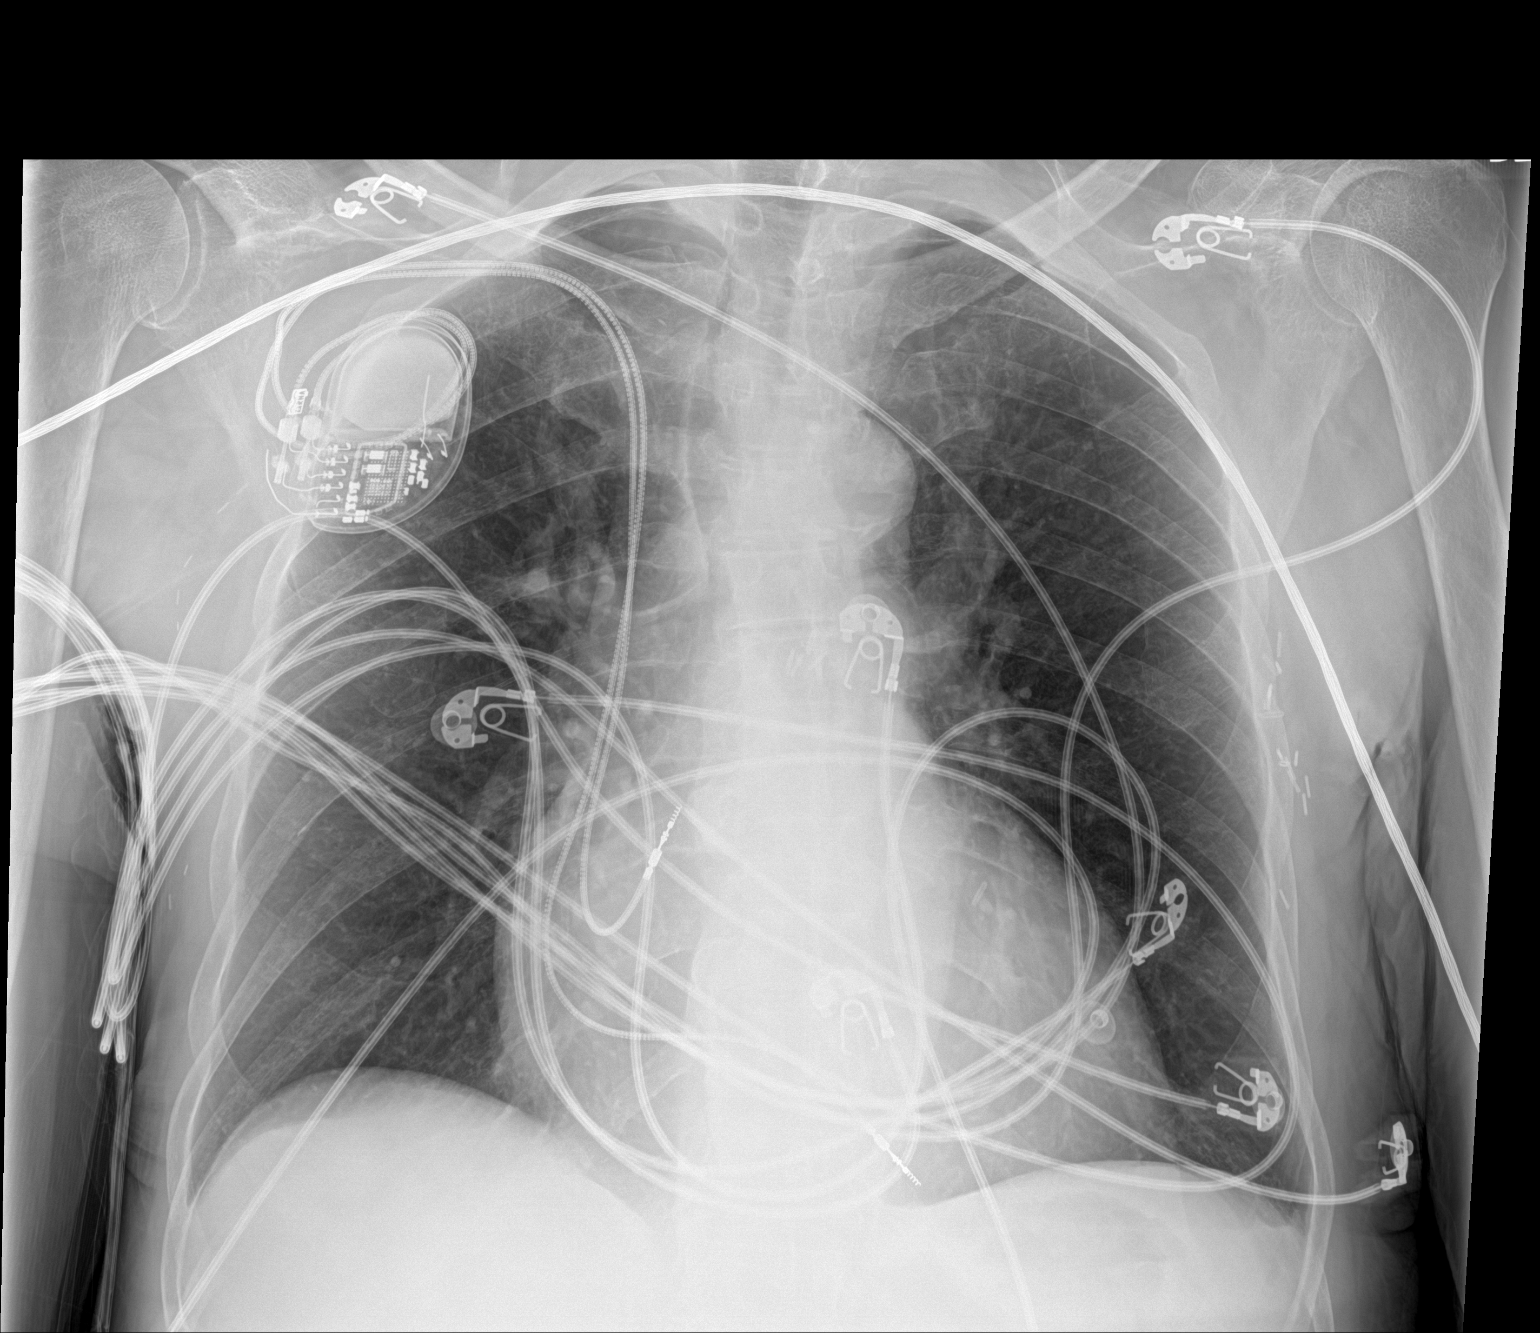

[1 of 1 positions shown; findings below may reference images not displayed]

FINDINGS: The heart size and mediastinal contours are borderline enlarged.
Aortic knob calcifications are seen. A right-sided pacemaker seen
with the lead tips in the right atrium right ventricle. Both lungs
are clear. The visualized skeletal structures are unremarkable.
Surgical clips seen within the left axilla.
IMPRESSION: No active disease.

## 2020-10-18 IMAGING — CT CT HEAD W/O CM
4 series · 15 of 47 positions shown, 17 images · non-contrast
Comparison: CT [DATE]

CLINICAL DATA: Several days of dizziness, recent antihypertensive
medication change

EXAM:
CT HEAD WITHOUT CONTRAST
TECHNIQUE: Contiguous axial images were obtained from the base of the skull
through the vertex without intravenous contrast.

[Series 3: head without · axial · non-contrast · 0.45mm/px · z∈[+1377,+1497]mm · 7 of 32 slices shown, 9 images]
[im 4/32  brain]
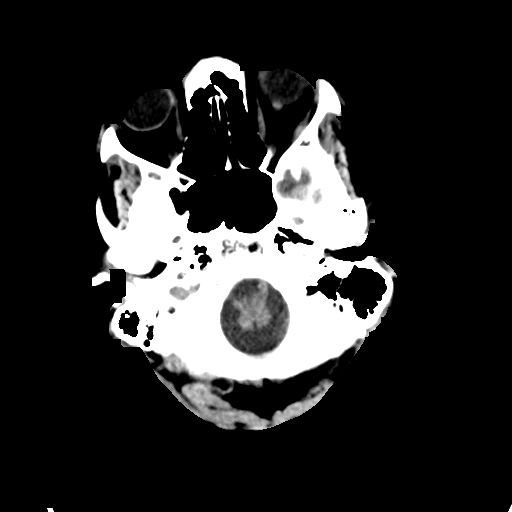
[im 4/32  bone]
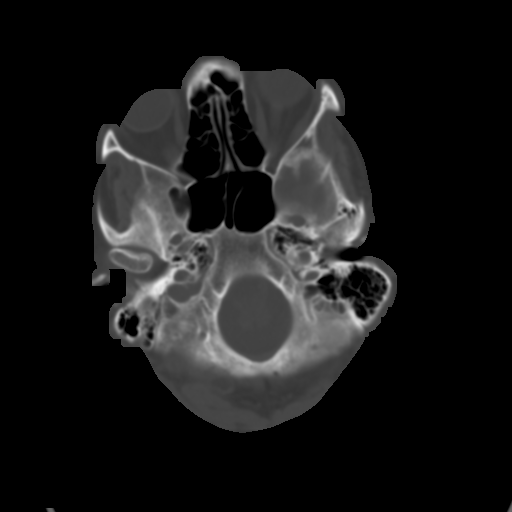
[im 8/32  brain]
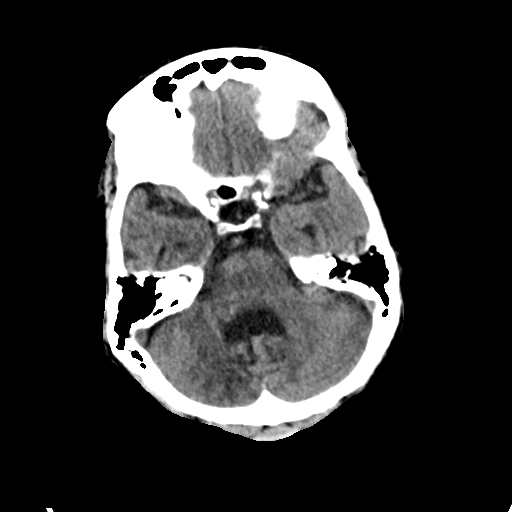
[im 12/32  brain]
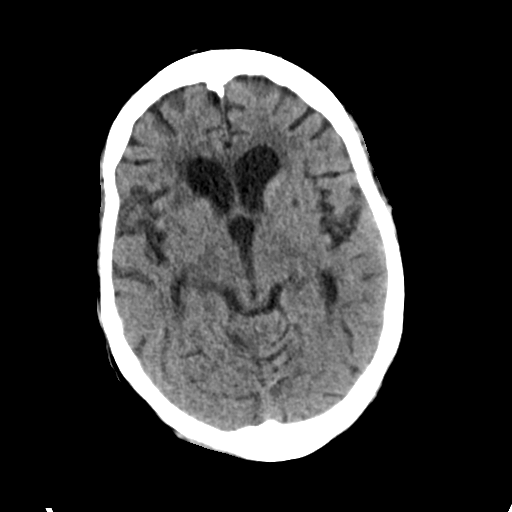
[im 16/32  brain]
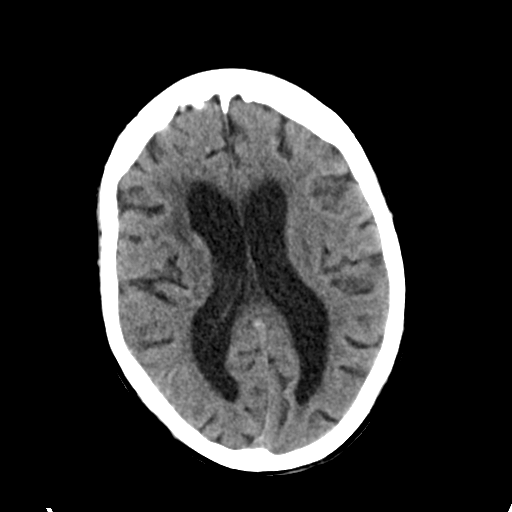
[im 20/32  brain]
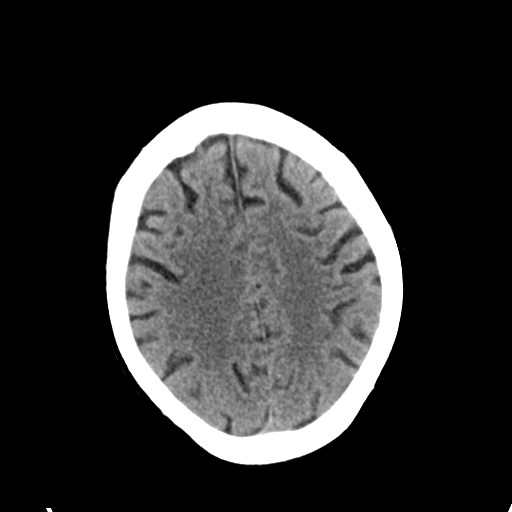
[im 20/32  bone]
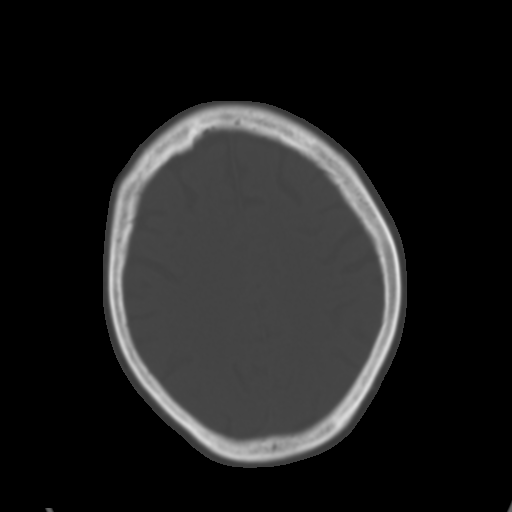
[im 24/32  brain]
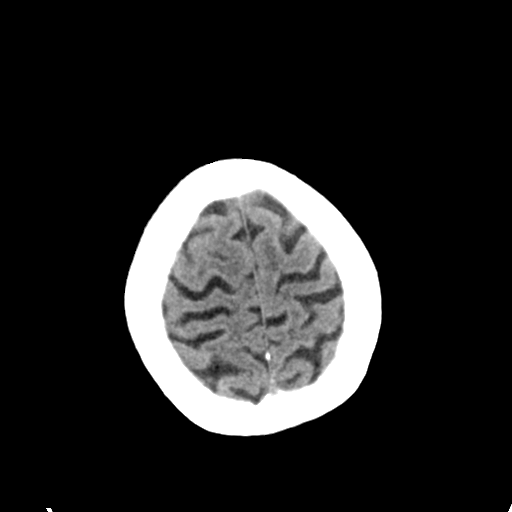
[im 28/32  brain]
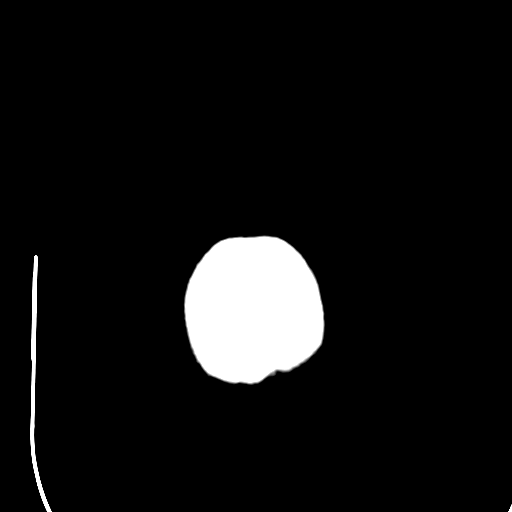

[Series 4: head bone · axial · 0.45mm/px · z∈[+1376,+1392]mm · 2 of 79 slices shown]
[im 8/79  bone]
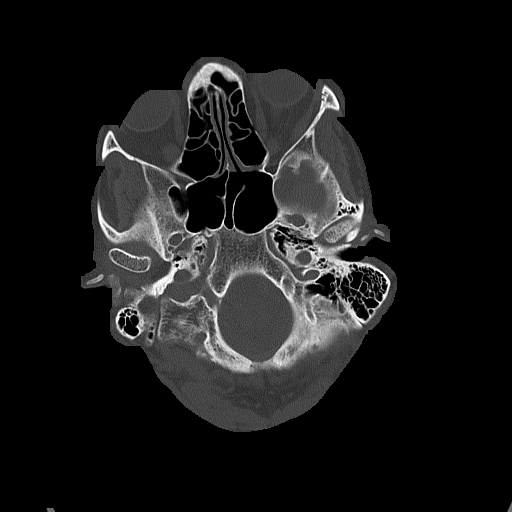
[im 16/79  bone]
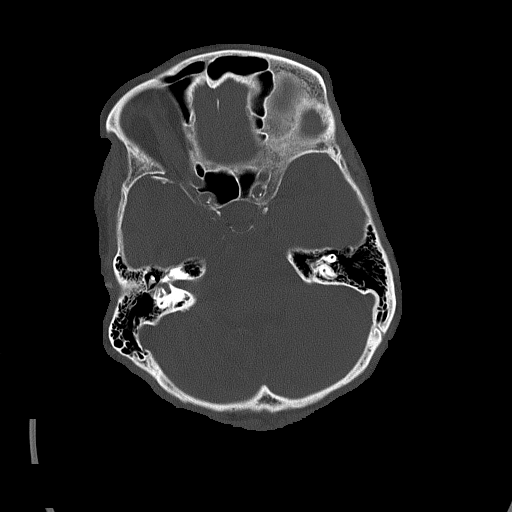

[Series 5: head without cor · coronal · non-contrast · 0.31mm/px · 3 of 69 slices shown]
[im 23/69  brain]
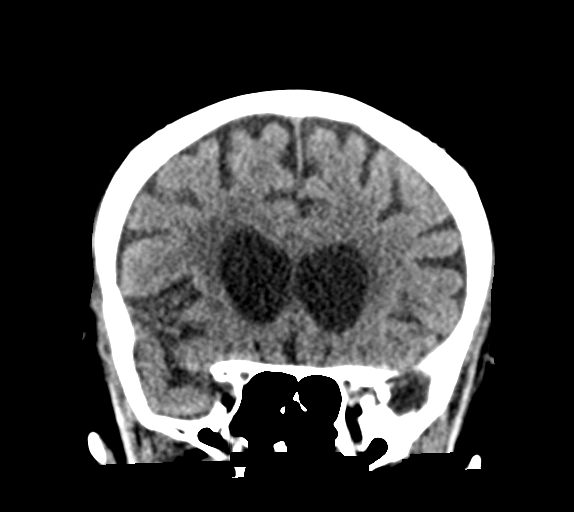
[im 31/69  brain]
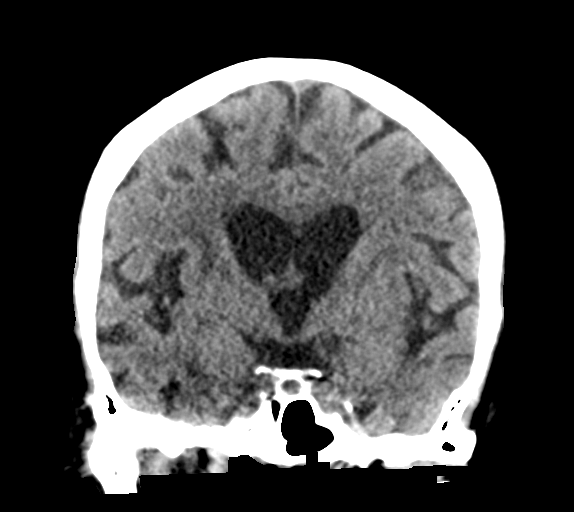
[im 38/69  brain]
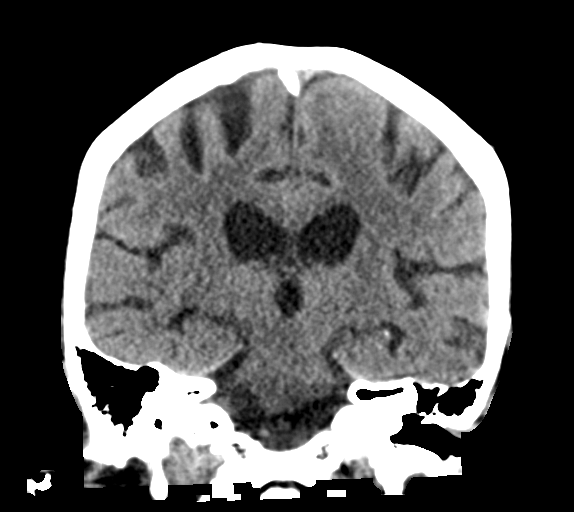

[Series 6: head without sag · sagittal · non-contrast · 0.33mm/px · 3 of 66 slices shown]
[im 22/66  brain]
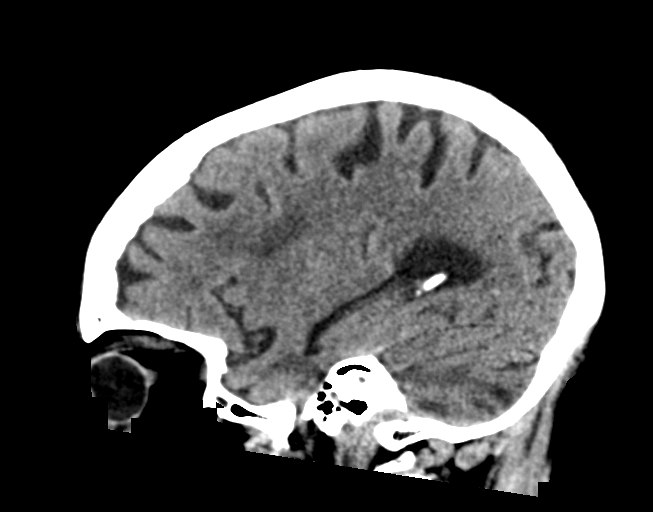
[im 33/66  brain]
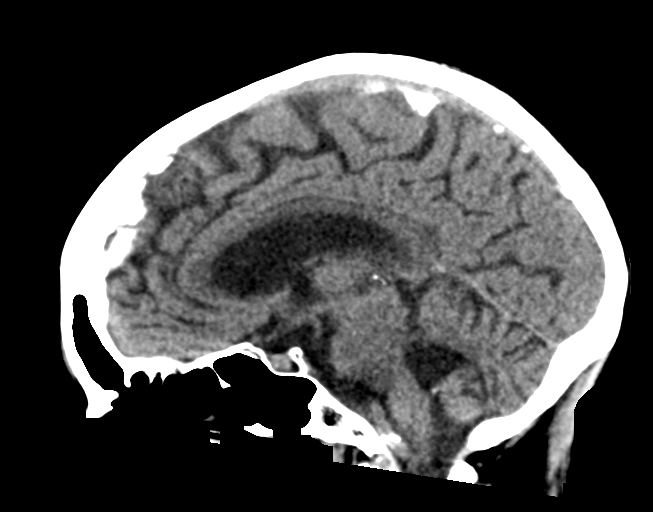
[im 44/66  brain]
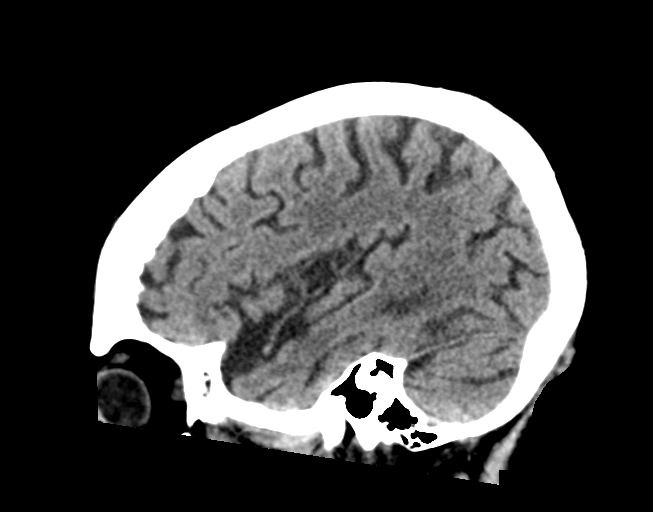

[15 of 47 positions shown; findings below may reference images not displayed]

FINDINGS: Brain: Stable region of encephalomalacia along the subcortical white
matter adjacent the anterior horn of the right lateral ventricle.
Additional bilateral lacunar type infarcts and encephalomalacia seen
in the bilateral cerebellar hemispheres including some expected
progressive encephalomalacia along the right inferior cerebellum
corresponding with a presumed subacute infarct on comparison CT. No
new CT evident areas of large vascular territory infarct or
cortically based infarct are seen. No evidence of acute hemorrhage,
hydrocephalus, extra-axial collection, visible mass lesion or mass
effect. Patchy areas of white matter hypoattenuation are most
compatible with chronic microvascular angiopathy. Symmetric
prominence of the ventricles, cisterns and sulci compatible with
parenchymal volume loss.

Vascular: Atherosclerotic calcification of the carotid siphons and
intradural vertebral arteries. No hyperdense vessel.

Skull: No calvarial fracture or suspicious osseous lesion. No scalp
swelling or hematoma.

Sinuses/Orbits: Paranasal sinuses and mastoid air cells are
predominantly clear. Orbital structures are unremarkable aside from
prior lens extractions.

Other: Debris in the right external auditory canal. Bilateral TMJ
arthrosis.
IMPRESSION: 1. No acute intracranial abnormality.
2. Stable appearance of right frontal encephalomalacia and bilateral
lacunar type infarcts in the basal ganglia and cerebellar
hemispheres. Expected evolution of encephalomalacia along the right
inferior cerebellum corresponding with a presumed subacute infarct
on comparison CT.
3. Chronic microvascular angiopathy and parenchymal volume loss.
4. Debris in the right external auditory canal, correlate for
cerumen impaction.

## 2020-10-18 NOTE — ED Triage Notes (Signed)
Pt c/o dizziness for a few days, switched dose of bp meds today from 30mg  to 60mg . Family stated she was drooling and didn't notice. Pt denies dizziness currently, no pain, A&O X4. Hx of stroke in 2019

## 2020-10-18 NOTE — ED Provider Notes (Signed)
[facility] EMERGENCY DEPARTMENT Provider Note   CSN: [id number] Arrival date & time: 10/18/20  2307     History Chief Complaint  Patient presents with  . Dizziness    Desiree Pollard is a 74 y.o. female.  Patient sent to the emergency department by ambulance from home for evaluation of dizziness.  Daughter reports that the patient was drooling earlier and did not realize it.  At arrival to the emergency department, patient is without complaints.  She denies dizziness.  She has not experiencing any headache, chest pain, shortness of breath.  Patient had her blood pressure medication increased today.  Family reports that she has had similar reactions to medication changes in the past but also has a history of stroke.        Past Medical History:  Diagnosis Date  . Breast cancer ([place])    2017  . CVA (cerebral vascular accident) ([place]) 11/2018  . Dehydration 07/2020  . Diabetes mellitus without complication ([place])   . Diabetic retinopathy ([place])    PDR OU  . Frequent diarrhea 07/2020  . Hyperkalemia 07/2020  . Hypertensive retinopathy    OU  . Stroke [facility])     Patient Active Problem List   Diagnosis Date Noted  . ARF (acute renal failure) ([place]) 10/19/2020  . Chronic diarrhea 09/18/2020  . Loss of weight 09/18/2020  . Dysphagia 09/18/2020  . Insulin dependent type 2 diabetes mellitus ([place]) 06/16/2020  . Hemoglobin A1c less than 7.0% 06/16/2020  . Hyperglycemia 06/16/2020  . Hypertension 06/16/2020  . History of stroke 06/16/2020    Past Surgical History:  Procedure Laterality Date  . CATARACT EXTRACTION Right   . MASTECTOMY Bilateral    "2017" Left Mastectomy; "2019" Right Mastectomy  . PACEMAKER IMPLANT  2017     OB History   No obstetric history on file.     Family History  Problem Relation Age of Onset  . Ovarian cancer Mother   . Heart disease Father   . Diabetes Sister   . Diabetes Brother   . Colon cancer Neg Hx   . Esophageal  cancer Neg Hx   . Pancreatic cancer Neg Hx   . Stomach cancer Neg Hx   . Liver disease Neg Hx     Social History   Tobacco Use  . Smoking status: Former Research scientist (life sciences)  . Smokeless tobacco: Never Used  Vaping Use  . Vaping Use: Never used  Substance Use Topics  . Alcohol use: Not Currently  . Drug use: Never    Home Medications Prior to Admission medications   Medication Sig Start Date End Date Taking? Authorizing Provider  Accu-Chek FastClix Lancets MISC USE AS DIRECTED TO TEST BLOOD GLUCOSE DAILY 05/24/19   [provider]  aspirin EC 81 MG tablet Take by mouth. 10/13/18   [provider]  atorvastatin (LIPITOR) 80 MG tablet Take by mouth. 10/13/18   [provider]  Blood Glucose Monitoring Suppl (FIFTY50 GLUCOSE METER 2.0) w/Device KIT Use to check blood sugars. Freestyle glucose meter. Okay to substitute for other meter preferred by insurance. ICD-10 E11.9. 08/31/18   [provider]  carvedilol (COREG) 25 MG tablet Take 25 mg by mouth 2 (two) times daily with a meal.  10/13/18   [provider]  donepezil (ARICEPT) 10 MG tablet Take 10 mg by mouth at bedtime. 05/11/20   [provider]  dorzolamide-timolol (COSOPT) 22.3-6.8 MG/ML ophthalmic solution INSTILL 1 DROP INTO BOTH EYES TWICE A DAY 01/14/20  Bernarda Caffey, MD  ferrous sulfate 325 (65 FE) MG tablet Take 325 mg by mouth daily with breakfast.    [provider]  insulin glargine (LANTUS) 100 UNIT/ML injection Inject 4 Units into the skin daily.    [provider]  Insulin Pen Needle (FIFTY50 PEN NEEDLES) 32G X 4 MM MISC Needles for insulin pen. Inject as instructed E11.9 11/17/16   [provider]  KLOR-CON M20 20 MEQ tablet Take 20 mEq by mouth 2 (two) times daily.  06/12/19   [provider]  latanoprost (XALATAN) 0.005 % ophthalmic solution Place 1 drop into the right eye at bedtime. 08/06/20   Bernarda Caffey, MD  lisinopril (ZESTRIL) 10 MG  tablet Take 10 mg by mouth daily.    [provider]  loperamide (IMODIUM A-D) 2 MG tablet Take 1 tablet (2 mg total) by mouth 4 (four) times daily as needed for diarrhea or loose stools. 08/09/20   [name], FNP  NIFEdipine (PROCARDIA-XL/NIFEDICAL-XL) 30 MG 24 hr tablet Take 30 mg by mouth daily.    [provider]  PEG-KCl-NaCl-NaSulf-Na Asc-C (PLENVU) 140 g SOLR Take 1 kit by mouth as directed. 09/18/20   Zehr, Laban Emperor, PA-C    Allergies    Amlodipine  Review of Systems   Review of Systems  Neurological: Positive for dizziness.  All other systems reviewed and are negative.   Physical Exam Updated Vital Signs BP 131/66 (BP Location: Right Arm)   Pulse 63   Temp 98.9 F (37.2 C) (Oral)   Resp 19   Ht _0  (1.702 m)   Wt 70.3 kg   SpO2 100%   BMI 24.28 kg/m   Physical Exam Vitals and nursing note reviewed.  Constitutional:      General: She is not in acute distress.    Appearance: Normal appearance. She is well-developed.  HENT:     Head: Normocephalic and atraumatic.     Right Ear: Hearing normal.     Left Ear: Hearing normal.     Nose: Nose normal.  Eyes:     Conjunctiva/sclera: Conjunctivae normal.     Pupils: Pupils are equal, round, and reactive to light.  Cardiovascular:     Rate and Rhythm: Regular rhythm.     Heart sounds: S1 normal and S2 normal. No murmur heard.  No friction rub. No gallop.   Pulmonary:     Effort: Pulmonary effort is normal. No respiratory distress.     Breath sounds: Normal breath sounds.  Chest:     Chest wall: No tenderness.  Abdominal:     General: Bowel sounds are normal.     Palpations: Abdomen is soft.     Tenderness: There is no abdominal tenderness. There is no guarding or rebound. Negative signs include Murphy's sign and McBurney's sign.     Hernia: No hernia is present.  Musculoskeletal:        General: Normal range of motion.     Cervical back: Normal range of motion and neck supple.  Skin:     General: Skin is warm and dry.     Findings: No rash.  Neurological:     Mental Status: She is alert and oriented to person, place, and time.     GCS: GCS eye subscore is 4. GCS verbal subscore is 5. GCS motor subscore is 6.     Cranial Nerves: No cranial nerve deficit.     Sensory: No sensory deficit.     Coordination: Coordination  normal.  Psychiatric:        Speech: Speech normal.        Behavior: Behavior normal.        Thought Content: Thought content normal.     ED Results / Procedures / Treatments   Labs (all labs ordered are listed, but only abnormal results are displayed) Labs Reviewed  CBC WITH DIFFERENTIAL/PLATELET - Abnormal; Notable for the following components:      Result Value   RBC 3.11 (*)    Hemoglobin 8.9 (*)    HCT 29.1 (*)    Platelets 119 (*)    All other components within normal limits  BASIC METABOLIC PANEL - Abnormal; Notable for the following components:   Chloride 113 (*)    CO2 19 (*)    Glucose, Bld 207 (*)    BUN 35 (*)    Creatinine, Ser 3.31 (*)    Calcium 8.6 (*)    GFR, Estimated 14 (*)    All other components within normal limits  BRAIN NATRIURETIC PEPTIDE - Abnormal; Notable for the following components:   B Natriuretic Peptide 321.9 (*)    All other components within normal limits  RESPIRATORY PANEL BY RT PCR (FLU A&B, COVID)  URINALYSIS, ROUTINE W REFLEX MICROSCOPIC  POC OCCULT BLOOD, ED    EKG None  Radiology CT HEAD WO CONTRAST  Result Date: 10/18/2020 CLINICAL DATA:  Several days of dizziness, recent antihypertensive medication change EXAM: CT HEAD WITHOUT CONTRAST TECHNIQUE: Contiguous axial images were obtained from the base of the skull through the vertex without intravenous contrast. COMPARISON:  CT 12/26/2019 FINDINGS: Brain: Stable region of encephalomalacia along the subcortical white matter adjacent the anterior horn of the right lateral ventricle. Additional bilateral lacunar type infarcts and encephalomalacia seen  in the bilateral cerebellar hemispheres including some expected progressive encephalomalacia along the right inferior cerebellum corresponding with a presumed subacute infarct on comparison CT. No new CT evident areas of large vascular territory infarct or cortically based infarct are seen. No evidence of acute hemorrhage, hydrocephalus, extra-axial collection, visible mass lesion or mass effect. Patchy areas of white matter hypoattenuation are most compatible with chronic microvascular angiopathy. Symmetric prominence of the ventricles, cisterns and sulci compatible with parenchymal volume loss. Vascular: Atherosclerotic calcification of the carotid siphons and intradural vertebral arteries. No hyperdense vessel. Skull: No calvarial fracture or suspicious osseous lesion. No scalp swelling or hematoma. Sinuses/Orbits: Paranasal sinuses and mastoid air cells are predominantly clear. Orbital structures are unremarkable aside from prior lens extractions. Other: Debris in the right external auditory canal. Bilateral TMJ arthrosis. IMPRESSION: 1. No acute intracranial abnormality. 2. Stable appearance of right frontal encephalomalacia and bilateral lacunar type infarcts in the basal ganglia and cerebellar hemispheres. Expected evolution of encephalomalacia along the right inferior cerebellum corresponding with a presumed subacute infarct on comparison CT. 3. Chronic microvascular angiopathy and parenchymal volume loss. 4. Debris in the right external auditory canal, correlate for cerumen impaction. Electronically Signed   By: Lovena Le M.D.   On: 10/18/2020 23:54   DG Chest Port 1 View  Result Date: 10/18/2020 CLINICAL DATA:  Dizziness EXAM: PORTABLE CHEST 1 VIEW COMPARISON:  None. FINDINGS: The heart size and mediastinal contours are borderline enlarged. Aortic knob calcifications are seen. A right-sided pacemaker seen with the lead tips in the right atrium right ventricle. Both lungs are clear. The visualized  skeletal structures are unremarkable. Surgical clips seen within the left axilla. IMPRESSION: No active disease. Electronically Signed   By: Prudencio Pair  M.D.   On: 10/18/2020 23:40    Procedures Procedures (including critical care time)  Medications Ordered in ED Medications  sodium chloride 0.9 % bolus 500 mL (500 mLs Intravenous New Bag/Given 10/19/20 0542)    ED Course  I have reviewed the triage vital signs and the nursing notes.  Pertinent labs & imaging results that were available during my care of the patient were reviewed by me and considered in my medical decision making (see chart for details).    MDM Rules/Calculators/A&P                          Patient sent to the emergency department by ambulance from home.  Daughter reports that the patient has been unsteady, having difficulty with ambulation for the last couple of days.  Has been very dizzy today.  She did just have her nifedipine dose increased from 30 to 60 mg.  She does not have any focal neurologic findings on exam.  She reports that she is not feeling dizzy while lying at rest but has had problems with movement.  Patient does have chronic renal insufficiency but creatinine is up from her normal baseline around 2-1/2-3.3 today with an elevated BUN.  This is consistent with dehydration, initiate fluid resuscitation.  CT had unremarkable.  Remainder of blood work fairly unremarkable other than hemoglobin of 8.9.  It appears that her baseline is around 9 or 10.  Rectal exam revealed dark stool but it was heme-negative.  She has had colonoscopy in the last few years that showed a polyp which was removed as well as diverticulosis.  Abdominal exam is benign.  Will admit patient for further management of acute kidney injury  Final Clinical Impression(s) / ED Diagnoses Final diagnoses:  Dizziness  AKI (acute kidney injury) [facility])    Rx / DC Orders ED Discharge Orders    None       Anisia Leija, Gwenyth Allegra, MD 10/19/20  [phone]

## 2020-10-19 ENCOUNTER — Encounter (HOSPITAL_COMMUNITY): Payer: Self-pay | Admitting: Internal Medicine

## 2020-10-19 DIAGNOSIS — R42 Dizziness and giddiness: Secondary | ICD-10-CM

## 2020-10-19 DIAGNOSIS — Z8249 Family history of ischemic heart disease and other diseases of the circulatory system: Secondary | ICD-10-CM | POA: Diagnosis not present

## 2020-10-19 DIAGNOSIS — I6381 Other cerebral infarction due to occlusion or stenosis of small artery: Secondary | ICD-10-CM | POA: Diagnosis present

## 2020-10-19 DIAGNOSIS — D696 Thrombocytopenia, unspecified: Secondary | ICD-10-CM | POA: Diagnosis present

## 2020-10-19 DIAGNOSIS — I361 Nonrheumatic tricuspid (valve) insufficiency: Secondary | ICD-10-CM | POA: Diagnosis not present

## 2020-10-19 DIAGNOSIS — Z87891 Personal history of nicotine dependence: Secondary | ICD-10-CM | POA: Diagnosis not present

## 2020-10-19 DIAGNOSIS — K529 Noninfective gastroenteritis and colitis, unspecified: Secondary | ICD-10-CM | POA: Diagnosis present

## 2020-10-19 DIAGNOSIS — I129 Hypertensive chronic kidney disease with stage 1 through stage 4 chronic kidney disease, or unspecified chronic kidney disease: Secondary | ICD-10-CM | POA: Diagnosis present

## 2020-10-19 DIAGNOSIS — I639 Cerebral infarction, unspecified: Secondary | ICD-10-CM | POA: Diagnosis not present

## 2020-10-19 DIAGNOSIS — Z20822 Contact with and (suspected) exposure to covid-19: Secondary | ICD-10-CM | POA: Diagnosis present

## 2020-10-19 DIAGNOSIS — E119 Type 2 diabetes mellitus without complications: Secondary | ICD-10-CM | POA: Diagnosis not present

## 2020-10-19 DIAGNOSIS — I69354 Hemiplegia and hemiparesis following cerebral infarction affecting left non-dominant side: Secondary | ICD-10-CM | POA: Diagnosis not present

## 2020-10-19 DIAGNOSIS — Z9013 Acquired absence of bilateral breasts and nipples: Secondary | ICD-10-CM | POA: Diagnosis not present

## 2020-10-19 DIAGNOSIS — D631 Anemia in chronic kidney disease: Secondary | ICD-10-CM | POA: Diagnosis present

## 2020-10-19 DIAGNOSIS — I442 Atrioventricular block, complete: Secondary | ICD-10-CM | POA: Diagnosis present

## 2020-10-19 DIAGNOSIS — Z853 Personal history of malignant neoplasm of breast: Secondary | ICD-10-CM | POA: Diagnosis not present

## 2020-10-19 DIAGNOSIS — Z950 Presence of cardiac pacemaker: Secondary | ICD-10-CM | POA: Diagnosis not present

## 2020-10-19 DIAGNOSIS — N184 Chronic kidney disease, stage 4 (severe): Secondary | ICD-10-CM | POA: Diagnosis present

## 2020-10-19 DIAGNOSIS — I6389 Other cerebral infarction: Secondary | ICD-10-CM | POA: Diagnosis not present

## 2020-10-19 DIAGNOSIS — E113513 Type 2 diabetes mellitus with proliferative diabetic retinopathy with macular edema, bilateral: Secondary | ICD-10-CM | POA: Diagnosis present

## 2020-10-19 DIAGNOSIS — I340 Nonrheumatic mitral (valve) insufficiency: Secondary | ICD-10-CM | POA: Diagnosis not present

## 2020-10-19 DIAGNOSIS — N179 Acute kidney failure, unspecified: Secondary | ICD-10-CM | POA: Diagnosis present

## 2020-10-19 DIAGNOSIS — Z7982 Long term (current) use of aspirin: Secondary | ICD-10-CM | POA: Diagnosis not present

## 2020-10-19 DIAGNOSIS — I10 Essential (primary) hypertension: Secondary | ICD-10-CM | POA: Diagnosis not present

## 2020-10-19 DIAGNOSIS — Z794 Long term (current) use of insulin: Secondary | ICD-10-CM

## 2020-10-19 DIAGNOSIS — Z833 Family history of diabetes mellitus: Secondary | ICD-10-CM | POA: Diagnosis not present

## 2020-10-19 DIAGNOSIS — Z8041 Family history of malignant neoplasm of ovary: Secondary | ICD-10-CM | POA: Diagnosis not present

## 2020-10-19 DIAGNOSIS — I422 Other hypertrophic cardiomyopathy: Secondary | ICD-10-CM | POA: Diagnosis present

## 2020-10-19 DIAGNOSIS — Z888 Allergy status to other drugs, medicaments and biological substances status: Secondary | ICD-10-CM | POA: Diagnosis not present

## 2020-10-19 DIAGNOSIS — Z79899 Other long term (current) drug therapy: Secondary | ICD-10-CM | POA: Diagnosis not present

## 2020-10-19 LAB — CBC WITH DIFFERENTIAL/PLATELET
Abs Immature Granulocytes: 0.02 10*3/uL (ref 0.00–0.07)
Abs Immature Granulocytes: 0.02 10*3/uL (ref 0.00–0.07)
Basophils Absolute: 0 10*3/uL (ref 0.0–0.1)
Basophils Absolute: 0 10*3/uL (ref 0.0–0.1)
Basophils Relative: 0 %
Basophils Relative: 0 %
Eosinophils Absolute: 0.1 10*3/uL (ref 0.0–0.5)
Eosinophils Absolute: 0.1 10*3/uL (ref 0.0–0.5)
Eosinophils Relative: 2 %
Eosinophils Relative: 2 %
HCT: 29.1 % — ABNORMAL LOW (ref 36.0–46.0)
HCT: 25.8 % — ABNORMAL LOW (ref 36.0–46.0)
Hemoglobin: 8.9 g/dL — ABNORMAL LOW (ref 12.0–15.0)
Hemoglobin: 7.8 g/dL — ABNORMAL LOW (ref 12.0–15.0)
Immature Granulocytes: 0 %
Immature Granulocytes: 0 %
Lymphocytes Relative: 16 %
Lymphocytes Relative: 19 %
Lymphs Abs: 1 10*3/uL (ref 0.7–4.0)
Lymphs Abs: 1.2 10*3/uL (ref 0.7–4.0)
MCH: 28.6 pg (ref 26.0–34.0)
MCH: 28.3 pg (ref 26.0–34.0)
MCHC: 30.6 g/dL (ref 30.0–36.0)
MCHC: 30.2 g/dL (ref 30.0–36.0)
MCV: 93.6 fL (ref 80.0–100.0)
MCV: 93.5 fL (ref 80.0–100.0)
Monocytes Absolute: 0.4 10*3/uL (ref 0.1–1.0)
Monocytes Absolute: 0.4 10*3/uL (ref 0.1–1.0)
Monocytes Relative: 6 %
Monocytes Relative: 7 %
Neutro Abs: 4.9 10*3/uL (ref 1.7–7.7)
Neutro Abs: 4.3 10*3/uL (ref 1.7–7.7)
Neutrophils Relative %: 76 %
Neutrophils Relative %: 72 %
Platelets: 119 10*3/uL — ABNORMAL LOW (ref 150–400)
Platelets: 149 10*3/uL — ABNORMAL LOW (ref 150–400)
RBC: 3.11 MIL/uL — ABNORMAL LOW (ref 3.87–5.11)
RBC: 2.76 MIL/uL — ABNORMAL LOW (ref 3.87–5.11)
RDW: 13.7 % (ref 11.5–15.5)
RDW: 13.6 % (ref 11.5–15.5)
WBC: 6.5 10*3/uL (ref 4.0–10.5)
WBC: 6.1 10*3/uL (ref 4.0–10.5)
nRBC: 0 % (ref 0.0–0.2)
nRBC: 0 % (ref 0.0–0.2)

## 2020-10-19 LAB — GLUCOSE, CAPILLARY
Glucose-Capillary: 122 mg/dL — ABNORMAL HIGH (ref 70–99)
Glucose-Capillary: 186 mg/dL — ABNORMAL HIGH (ref 70–99)
Glucose-Capillary: 151 mg/dL — ABNORMAL HIGH (ref 70–99)
Glucose-Capillary: 88 mg/dL (ref 70–99)

## 2020-10-19 LAB — BASIC METABOLIC PANEL
Anion gap: 11 (ref 5–15)
Anion gap: 9 (ref 5–15)
BUN: 35 mg/dL — ABNORMAL HIGH (ref 8–23)
BUN: 36 mg/dL — ABNORMAL HIGH (ref 8–23)
CO2: 19 mmol/L — ABNORMAL LOW (ref 22–32)
CO2: 19 mmol/L — ABNORMAL LOW (ref 22–32)
Calcium: 8.2 mg/dL — ABNORMAL LOW (ref 8.9–10.3)
Calcium: 8.6 mg/dL — ABNORMAL LOW (ref 8.9–10.3)
Chloride: 113 mmol/L — ABNORMAL HIGH (ref 98–111)
Chloride: 116 mmol/L — ABNORMAL HIGH (ref 98–111)
Creatinine, Ser: 3.31 mg/dL — ABNORMAL HIGH (ref 0.44–1.00)
Creatinine, Ser: 3.35 mg/dL — ABNORMAL HIGH (ref 0.44–1.00)
GFR, Estimated: 14 mL/min — ABNORMAL LOW (ref >60)
GFR, Estimated: 14 mL/min — ABNORMAL LOW (ref >60)
Glucose, Bld: 135 mg/dL — ABNORMAL HIGH (ref 70–99)
Glucose, Bld: 207 mg/dL — ABNORMAL HIGH (ref 70–99)
Potassium: 3.3 mmol/L — ABNORMAL LOW (ref 3.5–5.1)
Potassium: 4 mmol/L (ref 3.5–5.1)
Sodium: 143 mmol/L (ref 135–145)
Sodium: 144 mmol/L (ref 135–145)

## 2020-10-19 LAB — POC OCCULT BLOOD, ED: Fecal Occult Bld: NEGATIVE

## 2020-10-19 LAB — BRAIN NATRIURETIC PEPTIDE: B Natriuretic Peptide: 321.9 pg/mL — ABNORMAL HIGH (ref 0.0–100.0)

## 2020-10-19 LAB — RESPIRATORY PANEL BY RT PCR (FLU A&B, COVID)
Influenza A by PCR: NEGATIVE
Influenza B by PCR: NEGATIVE
SARS Coronavirus 2 by RT PCR: NEGATIVE

## 2020-10-19 MED ORDER — SODIUM CHLORIDE 0.9 % IV SOLN: INTRAVENOUS | Status: DC

## 2020-10-19 MED ORDER — ACETAMINOPHEN 325 MG PO TABS
650.0000 mg | ORAL_TABLET | Freq: Four times a day (QID) | ORAL | Status: DC | PRN
  Administered 2020-10-21: 650 mg via ORAL
  Filled 2020-10-19: qty 2

## 2020-10-19 MED ORDER — LATANOPROST 0.005 % OP SOLN
1.0000 [drp] | Freq: Every day | OPHTHALMIC | Status: DC
  Administered 2020-10-20 – 2020-10-28 (×9): 1 [drp] via OPHTHALMIC
  Filled 2020-10-19 (×2): qty 2.5

## 2020-10-19 MED ORDER — ASPIRIN EC 81 MG PO TBEC
81.0000 mg | DELAYED_RELEASE_TABLET | Freq: Every day | ORAL | Status: DC
  Administered 2020-10-19 – 2020-10-25 (×7): 81 mg via ORAL
  Filled 2020-10-19 (×7): qty 1

## 2020-10-19 MED ORDER — ATORVASTATIN CALCIUM 80 MG PO TABS
80.0000 mg | ORAL_TABLET | Freq: Every day | ORAL | Status: DC
  Administered 2020-10-19 – 2020-10-28 (×10): 80 mg via ORAL
  Filled 2020-10-19 (×10): qty 1

## 2020-10-19 MED ORDER — FERROUS SULFATE 325 (65 FE) MG PO TABS
325.0000 mg | ORAL_TABLET | Freq: Every day | ORAL | Status: DC
  Administered 2020-10-19 – 2020-10-28 (×10): 325 mg via ORAL
  Filled 2020-10-19 (×10): qty 1

## 2020-10-19 MED ORDER — HEPARIN SODIUM (PORCINE) 5000 UNIT/ML IJ SOLN
5000.0000 [IU] | Freq: Three times a day (TID) | INTRAMUSCULAR | Status: DC
  Administered 2020-10-19 – 2020-10-26 (×20): 5000 [IU] via SUBCUTANEOUS
  Filled 2020-10-19 (×20): qty 1

## 2020-10-19 MED ORDER — DONEPEZIL HCL 10 MG PO TABS
10.0000 mg | ORAL_TABLET | Freq: Every day | ORAL | Status: DC
  Administered 2020-10-19 – 2020-10-28 (×10): 10 mg via ORAL
  Filled 2020-10-19 (×11): qty 1

## 2020-10-19 MED ORDER — HYDRALAZINE HCL 20 MG/ML IJ SOLN
10.0000 mg | INTRAMUSCULAR | Status: DC | PRN
  Administered 2020-10-21: 10 mg via INTRAVENOUS
  Filled 2020-10-19: qty 1

## 2020-10-19 MED ORDER — ACETAMINOPHEN 650 MG RE SUPP: 650.0000 mg | Freq: Four times a day (QID) | RECTAL | Status: DC | PRN

## 2020-10-19 MED ORDER — CARVEDILOL 12.5 MG PO TABS
25.0000 mg | ORAL_TABLET | Freq: Two times a day (BID) | ORAL | Status: DC
  Administered 2020-10-19 – 2020-10-28 (×20): 25 mg via ORAL
  Filled 2020-10-19 (×21): qty 2

## 2020-10-19 MED ORDER — DORZOLAMIDE HCL-TIMOLOL MAL 2-0.5 % OP SOLN
1.0000 [drp] | Freq: Two times a day (BID) | OPHTHALMIC | Status: DC
  Administered 2020-10-19 – 2020-10-28 (×19): 1 [drp] via OPHTHALMIC
  Filled 2020-10-19 (×2): qty 10

## 2020-10-19 MED ORDER — SODIUM CHLORIDE 0.9 % IV BOLUS
500.0000 mL | Freq: Once | INTRAVENOUS | Status: AC
  Administered 2020-10-19: 500 mL via INTRAVENOUS

## 2020-10-19 MED ORDER — INSULIN GLARGINE 100 UNIT/ML ~~LOC~~ SOLN
4.0000 [IU] | Freq: Every day | SUBCUTANEOUS | Status: DC
  Administered 2020-10-19 – 2020-10-28 (×10): 4 [IU] via SUBCUTANEOUS
  Filled 2020-10-19 (×13): qty 0.04

## 2020-10-19 MED ORDER — INSULIN ASPART 100 UNIT/ML ~~LOC~~ SOLN
0.0000 [IU] | Freq: Three times a day (TID) | SUBCUTANEOUS | Status: DC
  Administered 2020-10-19 – 2020-10-21 (×3): 1 [IU] via SUBCUTANEOUS
  Administered 2020-10-22: 2 [IU] via SUBCUTANEOUS
  Administered 2020-10-24: 1 [IU] via SUBCUTANEOUS

## 2020-10-19 MED ORDER — NIFEDIPINE ER OSMOTIC RELEASE 30 MG PO TB24
30.0000 mg | ORAL_TABLET | Freq: Every day | ORAL | Status: DC
  Administered 2020-10-19 – 2020-10-21 (×3): 30 mg via ORAL
  Filled 2020-10-19 (×3): qty 1

## 2020-10-19 MED ORDER — STROKE: EARLY STAGES OF RECOVERY BOOK
Freq: Once | Status: AC
  Filled 2020-10-19: qty 1

## 2020-10-19 NOTE — Progress Notes (Signed)
PROGRESS NOTE  Desiree Radney  DOB: 09/03/1946  PCP: Patient, No Pcp Per KDT:[id number]  DOA: 10/18/2020  LOS: 0 days   Chief Complaint  Patient presents with  . Dizziness   Brief narrative: Desiree Pollard is a 74 y.o. 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 Patient presented to the ED on 11/5 with complaint of persistent dizziness for few days.  One of her blood pressure medications Procardia was doubled from 30 mg to 60 mg and she took 60 mg for the first time on 11/5.  Family noted that she was drooling and did not notice.    CT scan of head did not show any acute abnormality but showed stable right frontal encephalomalacia and bilateral lacunar type infarcts in the basal ganglia and cerebellar hemispheres. Labs showed creatinine elevated to 3.31, hemoglobin 8.9, platelet 119 Fecal occult blood test was negative. Respiratory virus panel including influenza and PCR was negative.  Subjective: Patient was seen and examined this morning. Elderly African-American female.  Propped up in bed.  Not in distress.  Slow to respond but oriented x3. Heart rate in 50s Labs with creatinine remains elevated at 3.35, hemoglobin down to 7.8  Assessment/Plan: Acute onset dizziness -Persistent symptoms for 2 to 3 days in a background of previous history of right cerebellar area stroke -Kept under observation to rule out new ischemic event. -MRI pending. -Neurology consulted. -LDL 70, A1c 6.2, patient recently had TSH and vitamin B-12 level. -Pending PT/OT/ST eval, echocardiogram -Continue aspirin, statin.  AKI on CKD 4  -Creatinine trend as below.  Continue normal saline at 75 mL/h.   Recent Labs    06/10/20 1433 08/07/20 1559 10/19/20 0001 10/19/20 0644  BUN 27 27 35* 36*  CREATININE 2.66* 2.56* 3.31* 3.35*    Essential hypertension -Home meds include Coreg, lisinopril, Procardia. -Per history, patient had just taken double dose of Procardia on the day of admission as advised by her outpatient provider. -Continue Coreg and Procardia at a lower dose of 30 mg daily.  Keep lisinopril on hold  Chronic anemia -Continue iron supplement.  Continue to monitor hemoglobin Recent Labs    06/10/20 1433 06/10/20 1433 08/07/20 1559 10/19/20 0001 10/19/20 0644  HGB 9.7*  --  10.5* 8.9* 7.8*  MCV 85   < > 89 93.6 93.5  VITAMINB12 405  --   --   --   --    < > = values in this interval not displayed.   Chronic thrombocytopenia -No active bleeding. Recent Labs  Lab 10/19/20 0001 10/19/20 0644  PLT 119* 149*   History of complete heart block status post pacemaker placement.  History of breast cancer in remission.   Mobility: PT eval pending Code Status:   Code Status: Full Code  Nutritional status: Body mass index is 24.28 kg/m.     Diet Order            Diet heart healthy/carb modified Room service appropriate? Yes; Fluid consistency: Thin  Diet effective now                 DVT prophylaxis: heparin injection 5,000 Units Start: 10/19/20 1400   Antimicrobials:  None Fluid: Normal saline at 75 mL/h Consultants: Neurology Family Communication:  None at bedside  Status is: Observation  The patient will require care spanning > 2  midnights and should be moved to inpatient because: AKI, stroke work-up pending  Dispo: The patient is from: Home              Anticipated d/c is to: Home versus SNF ending PT eval              Anticipated d/c date is: 1 to 2 days              Patient currently is not medically stable to d/c.       Infusions:  . sodium chloride 75 mL/hr at 10/19/20 0916    Scheduled Meds: .  stroke: mapping our early stages of recovery book   Does not apply Once  . aspirin EC  81 mg Oral Daily  . atorvastatin  80 mg Oral Daily  . carvedilol  25 mg Oral BID  WC  . donepezil  10 mg Oral QHS  . dorzolamide-timolol  1 drop Both Eyes BID  . ferrous sulfate  325 mg Oral Q breakfast  . heparin  5,000 Units Subcutaneous Q8H  . insulin aspart  0-6 Units Subcutaneous TID WC  . insulin glargine  4 Units Subcutaneous Daily  . latanoprost  1 drop Right Eye QHS  . NIFEdipine  30 mg Oral Daily    Antimicrobials: Anti-infectives (From admission, onward)   None      PRN meds: acetaminophen **OR** acetaminophen, hydrALAZINE   Objective: Vitals:   10/19/20 0825 10/19/20 1025  BP: 129/61 140/61  Pulse: 60 (!) 59  Resp:  16  Temp: (!) 97.5 F (36.4 C) 97.6 F (36.4 C)  SpO2: 98% 100%   No intake or output data in the 24 hours ending 10/19/20 1058 Filed Weights   10/18/20 2325  Weight: 70.3 kg   Weight change:  Body mass index is 24.28 kg/m.   Physical Exam: General exam: Appears calm and comfortable.  Not in physical distress Skin: No rashes, lesions or ulcers. HEENT: Atraumatic, normocephalic, supple neck, no obvious bleeding Lungs: Clear to auscultation bilaterally CVS: Regular rate and rhythm, no murmur GI/Abd soft, nontender, nondistended, bowel sound present CNS: Alert, awake, oriented x3 Psychiatry: Mood appropriate Extremities: No pedal edema, no calf tenderness  Data Review: I have personally reviewed the laboratory data and studies available.  Recent Labs  Lab 10/19/20 0001 10/19/20 0644  WBC 6.5 6.1  NEUTROABS 4.9 4.3  HGB 8.9* 7.8*  HCT 29.1* 25.8*  MCV 93.6 93.5  PLT 119* 149*   Recent Labs  Lab 10/19/20 0001 10/19/20 0644  NA 143 144  K 4.0 3.3*  CL 113* 116*  CO2 19* 19*  GLUCOSE 207* 135*  BUN 35* 36*  CREATININE 3.31* 3.35*  CALCIUM 8.6* 8.2*    F/u labs ordered  Signed, Terrilee Croak, MD [facility] 10/19/2020

## 2020-10-19 NOTE — ED Notes (Signed)
Pt appears to be sleeping at this time. Report called to Sharyn Lull, RN on 3W. RN denies any questions at this time.

## 2020-10-19 NOTE — Plan of Care (Signed)
  Problem: Nutrition: Goal: Adequate nutrition will be maintained Outcome: Progressing   Problem: Pain Managment: Goal: General experience of comfort will improve Outcome: Progressing   Problem: Safety: Goal: Ability to remain free from injury will improve Outcome: Progressing   

## 2020-10-19 NOTE — H&P (Addendum)
History and Physical    Desiree Badami JME:[id number] DOB: 03/30/1946 DOA: 10/18/2020  PCP: Patient, No Pcp Per  Patient coming from: Home.  Chief Complaint: Dizziness.  HPI: Desiree Pollard is a 74 y.o. female with history of chronic kidney disease stage IV baseline creatinine around 2.5, complete heart block status post pacemaker placement, history of breast cancer in remission, diabetes mellitus type 2, hypertension, chronic anemia started experiencing dizziness since last night.  Patient states her dizziness is even at lying down position.  Denies any nausea vomiting weakness of the extremities or any difficulty swallowing or speaking.  Since it persisted patient decided to come to the ER.  Patient states recently Procardia dose was doubled from 36m to 60 mg.  ED Course: In the ER patient had a CT head which showed chronic changes in the evolutionary changes of subacute stroke in the right cerebellum.  Patient was given fluid bolus (since patient's creatinine has worsened from baseline of 2.5 is around 3.3.  Patient blood work also shows new thrombocytopenia with mild worsening of anemia from usual around 10 it is around 8.  Stool for occult blood was negative.  Covid test is pending.  Patient admitted for further work-up dizziness cause not clear.  Review of Systems: As per HPI, rest all negative.   Past Medical History:  Diagnosis Date  . Breast cancer ([facility]    2017  . [facility]re[facility]) ([facility] 11/2018  . Dehydration 07/2020  . Diabetes mellitus without complication ([facility]   . Diabetic retinopathy ([facility]    PDR O[facility]  . Frequent diarrhea 07/2020  . Hyperkalemia 07/2020  . Hypertensive retinopathy    [facility]  . Stroke ([facility]     Past Surgical History:  Procedure Laterality Date  . CATARACT EXTRACTION Right   . MASTECTOMY Bilateral    "2017" Left Mastectomy; "2019" Right Mastectomy  . PACEMAKER IMPLANT  2017     reports that she has quit smoking. She has never used  smokeless tobacco. She reports previous alcohol use. She reports that she does not use drugs.  Allergies  Allergen Reactions  . Amlodipine Nausea And Vomiting    Pt refuses to take due to severe n/v that began after starting amlodipine and stopped after discontinuation.    Family History  Problem Relation Age of Onset  . Ovarian cancer Mother   . Heart disease Father   . Diabetes Sister   . Diabetes Brother   . Colon cancer Neg Hx   . Esophageal cancer Neg Hx   . Pancreatic cancer Neg Hx   . Stomach cancer Neg Hx   . Liver disease Neg Hx     Prior to Admission medications   Medication Sig Start Date End Date Taking? Authorizing Provider  Accu-Chek FastClix Lancets MISC USE AS DIRECTED TO TEST BLOOD GLUCOSE DAILY 05/24/19   [provider]  aspirin EC 81 MG tablet Take by mouth. 10/13/18   [provider]  atorvastatin (LIPITOR) 80 MG tablet Take by mouth. 10/13/18   [provider]  Blood Glucose Monitoring Suppl (FIFTY50 GLUCOSE METER 2.0) w/Device KIT Use to check blood sugars. Freestyle glucose meter. Okay to substitute for other meter preferred by insurance. ICD-10 E11.9. 08/31/18   [provider]  carvedilol (COREG) 25 MG tablet Take 25 mg by mouth 2 (two) times daily with a meal.  10/13/18   [provider]  donepezil (ARICEPT) 10 MG tablet Take 10 mg by mouth at bedtime. 05/11/20  [provider]  dorzolamide-timolol (COSOPT) 22.3-6.8 MG/ML ophthalmic solution INSTILL 1 DROP INTO BOTH EYES TWICE A DAY 01/14/20   Bernarda Caffey, MD  ferrous sulfate 325 (65 FE) MG tablet Take 325 mg by mouth daily with breakfast.    [provider]  insulin glargine (LANTUS) 100 UNIT/ML injection Inject 4 Units into the skin daily.    [provider]  Insulin Pen Needle (FIFTY50 PEN NEEDLES) 32G X 4 MM MISC Needles for insulin pen. Inject as instructed E11.9 11/17/16   [provider]  KLOR-CON M20 20 MEQ tablet Take 20  mEq by mouth 2 (two) times daily.  06/12/19   [provider]  latanoprost (XALATAN) 0.005 % ophthalmic solution Place 1 drop into the right eye at bedtime. 08/06/20   Bernarda Caffey, MD  lisinopril (ZESTRIL) 10 MG tablet Take 10 mg by mouth daily.    [provider]  loperamide (IMODIUM A-D) 2 MG tablet Take 1 tablet (2 mg total) by mouth 4 (four) times daily as needed for diarrhea or loose stools. 08/09/20   [name], FNP  NIFEdipine (PROCARDIA-XL/NIFEDICAL-XL) 30 MG 24 hr tablet Take 30 mg by mouth daily.    [provider]  PEG-KCl-NaCl-NaSulf-Na Asc-C (PLENVU) 140 g SOLR Take 1 kit by mouth as directed. 09/18/20   Zehr, Laban Emperor, PA-C    Physical Exam: Constitutional: Moderately built and nourished. Vitals:   10/19/20 0030 10/19/20 0527 10/19/20 0528 10/19/20 0531  BP: (!) 112/56 125/65 119/62 131/66  Pulse: 60 62 62 63  Resp: 15 18 18 19   Temp:      TempSrc:      SpO2: 98% 100% 100% 100%  Weight:      Height:       Eyes: Anicteric no pallor.  Right eyes mildly congested. ENMT: No discharge from the ears eyes nose or mouth. Neck: No mass felt.  No neck rigidity. Respiratory: No rhonchi or crepitations. Cardiovascular: S1-S2 heard. Abdomen: Soft nontender bowel sounds present. Musculoskeletal: No edema. Skin: No rash. Neurologic: Alert awake oriented to time place and person.  Moves all extremities 5 x 5.  No facial asymmetry tongue is midline.  Pupils are equal and reacting to light.  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 Psychiatric: Appears normal.  Normal affect.   Labs on Admission: I have personally reviewed following labs and imaging studies  CBC: Recent Labs  Lab 10/19/20 0001  WBC 6.5  NEUTROABS 4.9  HGB 8.9*  HCT 29.1*  MCV 93.6  PLT 681*   Basic Metabolic Panel: Recent Labs  Lab 10/19/20 0001  NA 143  K 4.0  CL 113*  CO2 19*  GLUCOSE 207*  BUN 35*  CREATININE 3.31*  CALCIUM 8.6*   GFR: Estimated  Creatinine Clearance: 14.1 mL/min (A) (by C-G formula based on SCr of 3.31 mg/dL (H)). Liver Function Tests: No results for input(s): AST, ALT, ALKPHOS, BILITOT, PROT, ALBUMIN in the last 168 hours. No results for input(s): LIPASE, AMYLASE in the last 168 hours. No results for input(s): AMMONIA in the last 168 hours. Coagulation Profile: No results for input(s): INR, PROTIME in the last 168 hours. Cardiac Enzymes: No results for input(s): CKTOTAL, CKMB, CKMBINDEX, TROPONINI in the last 168 hours. BNP (last 3 results) No results for input(s): PROBNP in the last 8760 hours. HbA1C: No results for input(s): HGBA1C in the last 72 hours. CBG: No results for input(s): GLUCAP in the last 168 hours. Lipid Profile: No results for input(s):  CHOL, HDL, LDLCALC, TRIG, CHOLHDL, LDLDIRECT in the last 72 hours. Thyroid Function Tests: No results for input(s): TSH, T4TOTAL, FREET4, T3FREE, THYROIDAB in the last 72 hours. Anemia Panel: No results for input(s): VITAMINB12, FOLATE, FERRITIN, TIBC, IRON, RETICCTPCT in the last 72 hours. Urine analysis: No results found for: COLORURINE, APPEARANCEUR, LABSPEC, PHURINE, GLUCOSEU, HGBUR, BILIRUBINUR, KETONESUR, PROTEINUR, UROBILINOGEN, NITRITE, LEUKOCYTESUR Sepsis Labs: @LABRCNTIP (procalcitonin:4,lacticidven:4) )No results found for this or any previous visit (from the past 240 hour(s)).   Radiological Exams on Admission: CT HEAD WO CONTRAST  Result Date: 10/18/2020 CLINICAL DATA:  Several days of dizziness, recent antihypertensive medication change EXAM: CT HEAD WITHOUT CONTRAST TECHNIQUE: Contiguous axial images were obtained from the base of the skull through the vertex without intravenous contrast. COMPARISON:  CT 12/26/2019 FINDINGS: Brain: Stable region of encephalomalacia along the subcortical white matter adjacent the anterior horn of the right lateral ventricle. Additional bilateral lacunar type infarcts and encephalomalacia seen in the bilateral  cerebellar hemispheres including some expected progressive encephalomalacia along the right inferior cerebellum corresponding with a presumed subacute infarct on comparison CT. No new CT evident areas of large vascular territory infarct or cortically based infarct are seen. No evidence of acute hemorrhage, hydrocephalus, extra-axial collection, visible mass lesion or mass effect. Patchy areas of white matter hypoattenuation are most compatible with chronic microvascular angiopathy. Symmetric prominence of the ventricles, cisterns and sulci compatible with parenchymal volume loss. Vascular: Atherosclerotic calcification of the carotid siphons and intradural vertebral arteries. No hyperdense vessel. Skull: No calvarial fracture or suspicious osseous lesion. No scalp swelling or hematoma. Sinuses/Orbits: Paranasal sinuses and mastoid air cells are predominantly clear. Orbital structures are unremarkable aside from prior lens extractions. Other: Debris in the right external auditory canal. Bilateral TMJ arthrosis. IMPRESSION: 1. No acute intracranial abnormality. 2. Stable appearance of right frontal encephalomalacia and bilateral lacunar type infarcts in the basal ganglia and cerebellar hemispheres. Expected evolution of encephalomalacia along the right inferior cerebellum corresponding with a presumed subacute infarct on comparison CT. 3. Chronic microvascular angiopathy and parenchymal volume loss. 4. Debris in the right external auditory canal, correlate for cerumen impaction. Electronically Signed   By: Lovena Le M.D.   On: 10/18/2020 23:54   DG Chest Port 1 View  Result Date: 10/18/2020 CLINICAL DATA:  Dizziness EXAM: PORTABLE CHEST 1 VIEW COMPARISON:  None. FINDINGS: The heart size and mediastinal contours are borderline enlarged. Aortic knob calcifications are seen. A right-sided pacemaker seen with the lead tips in the right atrium right ventricle. Both lungs are clear. The visualized skeletal structures  are unremarkable. Surgical clips seen within the left axilla. IMPRESSION: No active disease. Electronically Signed   By: Prudencio Pair M.D.   On: 10/18/2020 23:40     Assessment/Plan Active Problems:   Insulin dependent type 2 diabetes mellitus ([place])   Hypertension   History of stroke   ARF (acute renal failure) (H[facility])   Dizziness   Thrombocytopenia ([place])    1. Dizziness cause not clear.  Since CT head is showing some subacute changes in the right cerebellar area will discussed with neurologist.  Unable to do MRI brain due to pacemaker.  We will keep patient neurochecks continue monitoring telemetry get physical therapy consult.Addendum - discussed with Dr.Lindzen Neurologist advised MRI Brain. 2. Acute on chronic disease stage IV patient did receive fluid bolus for 100 cc in the ER.  Follow metabolic panel hold lisinopril. 3. Hypertension we will continue with Procardia 30 mg p.o. daily along with Coreg.  Will hold lisinopril due  to worsening renal function.  As needed IV hydralazine for systolic more than 521 and if neurologist recommends no stroke work-up then may be more aggressive control.  Check orthostatics. 4. Chronic anemia likely from renal disease mild worsening.  Follow CBC.  On iron supplements. 5. Thrombocytopenia appears to be new.  Am repeating a CBC now.  If there is any further worsening will need further work-up. 6. History of complete heart block status post pacemaker placement. 7. History of breast cancer in remission.  Covid test is pending.   DVT prophylaxis: Heparin. Code Status: Full code. Family Communication: Discussed with patient. Disposition Plan: Home. Consults called: We will consult neurology. Admission status: Observation.   Rise Patience MD [facility] Pager [phone].  If 7PM-7AM, please contact night-coverage [email or web address] Password [facility]  10/19/2020, 6:21 AM

## 2020-10-20 DIAGNOSIS — R42 Dizziness and giddiness: Secondary | ICD-10-CM | POA: Diagnosis not present

## 2020-10-20 LAB — CBC WITH DIFFERENTIAL/PLATELET
Abs Immature Granulocytes: 0.02 10*3/uL (ref 0.00–0.07)
Basophils Absolute: 0 10*3/uL (ref 0.0–0.1)
Basophils Relative: 0 %
Eosinophils Absolute: 0.2 10*3/uL (ref 0.0–0.5)
Eosinophils Relative: 3 %
HCT: 24.5 % — ABNORMAL LOW (ref 36.0–46.0)
Hemoglobin: 7.7 g/dL — ABNORMAL LOW (ref 12.0–15.0)
Immature Granulocytes: 0 %
Lymphocytes Relative: 27 %
Lymphs Abs: 1.3 10*3/uL (ref 0.7–4.0)
MCH: 29.1 pg (ref 26.0–34.0)
MCHC: 31.4 g/dL (ref 30.0–36.0)
MCV: 92.5 fL (ref 80.0–100.0)
Monocytes Absolute: 0.5 10*3/uL (ref 0.1–1.0)
Monocytes Relative: 9 %
Neutro Abs: 2.9 10*3/uL (ref 1.7–7.7)
Neutrophils Relative %: 61 %
Platelets: 151 10*3/uL (ref 150–400)
RBC: 2.65 MIL/uL — ABNORMAL LOW (ref 3.87–5.11)
RDW: 13.8 % (ref 11.5–15.5)
WBC: 4.8 10*3/uL (ref 4.0–10.5)
nRBC: 0 % (ref 0.0–0.2)

## 2020-10-20 LAB — PHOSPHORUS: Phosphorus: 3.6 mg/dL (ref 2.5–4.6)

## 2020-10-20 LAB — BASIC METABOLIC PANEL
Anion gap: 7 (ref 5–15)
BUN: 31 mg/dL — ABNORMAL HIGH (ref 8–23)
CO2: 20 mmol/L — ABNORMAL LOW (ref 22–32)
Calcium: 8.1 mg/dL — ABNORMAL LOW (ref 8.9–10.3)
Chloride: 118 mmol/L — ABNORMAL HIGH (ref 98–111)
Creatinine, Ser: 2.97 mg/dL — ABNORMAL HIGH (ref 0.44–1.00)
GFR, Estimated: 16 mL/min — ABNORMAL LOW (ref >60)
Glucose, Bld: 81 mg/dL (ref 70–99)
Potassium: 3.1 mmol/L — ABNORMAL LOW (ref 3.5–5.1)
Sodium: 145 mmol/L (ref 135–145)

## 2020-10-20 LAB — GLUCOSE, CAPILLARY
Glucose-Capillary: 79 mg/dL (ref 70–99)
Glucose-Capillary: 126 mg/dL — ABNORMAL HIGH (ref 70–99)
Glucose-Capillary: 141 mg/dL — ABNORMAL HIGH (ref 70–99)

## 2020-10-20 LAB — MAGNESIUM: Magnesium: 1.9 mg/dL (ref 1.7–2.4)

## 2020-10-20 MED ORDER — PANTOPRAZOLE SODIUM 40 MG IV SOLR
40.0000 mg | Freq: Two times a day (BID) | INTRAVENOUS | Status: DC
  Administered 2020-10-20 – 2020-10-22 (×5): 40 mg via INTRAVENOUS
  Filled 2020-10-20 (×5): qty 40

## 2020-10-20 MED ORDER — POTASSIUM CHLORIDE CRYS ER 20 MEQ PO TBCR
40.0000 meq | EXTENDED_RELEASE_TABLET | Freq: Once | ORAL | Status: AC
  Administered 2020-10-20: 40 meq via ORAL
  Filled 2020-10-20: qty 2

## 2020-10-20 NOTE — Evaluation (Addendum)
Physical Therapy Evaluation Patient Details Name: Desiree Pollard MRN: [id number] DOB: [birth year] Today's Date: 10/20/2020   History of Present Illness  Desiree Pollard is a 74 y.o. female with history of chronic kidney disease stage IV baseline creatinine around 2.5, complete heart block status post pacemaker placement, history of breast cancer in remission, diabetes mellitus type 2, hypertension, chronic anemia admitted due to experiencing dizziness.  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  Patient blood work also shows new thrombocytopenia with mild worsening of anemia from usual around 10 to 8.  Clinical Impression  Patient presents with decreased mobility due to weakness, decreased balance, decreased safety awareness, and high risk for falls though reports no falls at home in past 6 months.  She will benefit from skilled PT in the acute setting and will need STSNF level rehab at d/c.  Currently mod A overall for OOB to BSC/recliner.  Previously ambulated to bathroom with RW unaided with intermittent support throughout the day.    Follow Up Recommendations SNF;Supervision/Assistance - 24 hour    Equipment Recommendations  None recommended by PT    Recommendations for Other Services       Precautions / Restrictions Precautions Precautions: Fall Restrictions Weight Bearing Restrictions: No      Mobility  Bed Mobility Overal bed mobility: Needs Assistance Bed Mobility: Supine to Sit     Supine to sit: HOB elevated;Mod assist     General bed mobility comments: assist to guide legs to EOB and to lift trunk; increased time to scoot to EOB    Transfers Overall transfer level: Needs assistance Equipment used: Rolling walker (2 wheeled) Transfers: Sit to/from [facility] Sit to Stand: Mod assist;From elevated surface Stand pivot transfers: Mod assist       General transfer comment: lifting  assist from EOB (easier after elevating height of bed); min to mod A for balance as pt stepping to BSC flexed posture legs locked out in extension  Ambulation/Gait             General Gait Details: NT due to weakness, fecal incontinence  Stairs            Wheelchair Mobility    Modified Rankin (Stroke Patients Only) Modified Rankin (Stroke Patients Only) Pre-Morbid Rankin Score: Moderate disability Modified Rankin: Moderately severe disability     Balance Overall balance assessment: Needs assistance   Sitting balance-Leahy Scale: Fair     Standing balance support: Bilateral upper extremity supported Standing balance-Leahy Scale: Poor Standing balance comment: heavy UE support, and min A for balance                             Pertinent Vitals/Pain Pain Assessment: No/denies pain    Home Living Family/patient expects to be discharged to:: Private residence Living Arrangements: Other relatives (sister who works and niece checks in)     Home Access: Stairs to enter Entrance Stairs-Rails: Psychiatric nurse of Steps: 4 Home Layout: One level Home Equipment: Environmental consultant - 2 wheels      Prior Function Level of Independence: Independent with assistive device(s);Needs assistance   Gait / Transfers Assistance Needed: could walk to bathroom with her walker on her own  ADL's / Homemaking Assistance Needed: sister and neice assist with ADL's pt with decreased vision        Hand Dominance        Extremity/Trunk Assessment  Upper Extremity Assessment Upper Extremity Assessment: Generalized weakness    Lower Extremity Assessment Lower Extremity Assessment: Generalized weakness    Cervical / Trunk Assessment Cervical / Trunk Assessment: Kyphotic  Communication   Communication: No difficulties  Cognition Arousal/Alertness: Awake/alert Behavior During Therapy: WFL for tasks assessed/performed Overall Cognitive Status: Within  Functional Limits for tasks assessed                                        General Comments General comments (skin integrity, edema, etc.): HR 66 with up to BSC/recliner, incontinent in bed with black stools, RN aware, max A for hygiene    Exercises     Assessment/Plan    PT Assessment Patient needs continued PT services  PT Problem List Decreased strength;Decreased mobility;Decreased balance;Decreased knowledge of use of DME;Decreased activity tolerance       PT Treatment Interventions DME instruction;Therapeutic activities;Therapeutic exercise;Patient/family education;Gait training;Balance training;Functional mobility training    PT Goals (Current goals can be found in the Care Plan section)  Acute Rehab PT Goals Patient Stated Goal: to get stronger before home PT Goal Formulation: With patient Time For Goal Achievement: 11/03/20 Potential to Achieve Goals: Good    Frequency Min 3X/week   Barriers to discharge Decreased caregiver support sister works, intermittent checking from [employer]    Co-evaluation               AM-PAC PT "6 Clicks" Mobility  Outcome Measure Help needed turning from your back to your side while in a flat bed without using bedrails?: A Little Help needed moving from lying on your back to sitting on the side of a flat bed without using bedrails?: A Lot Help needed moving to and from a bed to a chair (including a wheelchair)?: A Lot Help needed standing up from a chair using your arms (e.g., wheelchair or bedside chair)?: A Lot Help needed to walk in hospital room?: Total Help needed climbing 3-5 steps with a railing? : Total 6 Click Score: 11    End of Session Equipment Utilized During Treatment: Gait belt Activity Tolerance: Patient limited by fatigue Patient left: in chair;with call bell/phone within reach;with chair alarm set Nurse Communication: Mobility status PT Visit Diagnosis: Other abnormalities of gait and mobility  (R26.89);Muscle weakness (generalized) (M62.81);Dizziness and giddiness (R42)    Time: 2446-2863 PT Time Calculation (min) (ACUTE ONLY): 40 min   Charges:   PT Evaluation $PT Eval Moderate Complexity: 1 Mod PT Treatments $Therapeutic Activity: 23-37 mins        Magda Kiel, PT Acute Rehabilitation Services OTRRN:[phone] [phone] 10/20/2020   Desiree Pollard 10/20/2020, 11:58 AM

## 2020-10-20 NOTE — Progress Notes (Signed)
Pt has been calm and cooperative throughout shift. She took all scheduled meds without difficulty however, did need to take one at a time. Evaluated by speech today at bedside. PT also worked with patient today. During PT, pt was noted to have a dark stool per therapist. Dr. Pietro Cassis was made aware. Order for next stool to be collected and sent to lab however, no more episodes noted. Will pass on to next shift nurse. Pt has denied any pain and/or discomfort. Using pure wick effectively with not complaints. Will continue to monitor.

## 2020-10-20 NOTE — Progress Notes (Addendum)
PROGRESS NOTE  Desiree Pollard  DOB: 1946-05-30  PCP: Patient, No Pcp Per ZOX:[id number]  DOA: 10/18/2020  LOS: 1 day   Chief Complaint  Patient presents with  . Dizziness   Brief narrative: Desiree Pollard is a 74 y.o. 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 Patient presented to the ED on 11/5 with complaint of persistent dizziness for few days.  Apparently, patient's blood pressure was being monitored and meds adjusted by her PCP as an outpatient. One of her blood pressure medications Procardia was doubled from 30 mg to 60 mg recently.  Patient was experiencing dizziness especially on getting up.  On the day of admission, family noted that she was drooling and did not notice.    CT scan of head did not show any acute abnormality but showed stable right frontal encephalomalacia and bilateral lacunar type infarcts in the basal ganglia and cerebellar hemispheres. Labs showed creatinine elevated to 3.31, hemoglobin 8.9, platelet 119  Patient was admitted for further evaluation. Also noted to be positive for orthostatic blood pressure drop.  Subjective: Patient was seen and examined this morning. Elderly African-American female.  Propped up in bed.  Not in distress.  Slow to respond but oriented x3. Heart rate in 50s Labs with creatinine remains elevated at 3.35, hemoglobin down to 7.8 Per RN, patient is having black stool.  However Hemoccult negative.  Assessment/Plan: Acute onset dizziness -Persistent symptoms of dizziness especially on getting up for 2 to 3 days in a background of previous history of right cerebellar area stroke -MRI pending to rule out acute ischemic event. -History is more suggestive of orthostatic hypotension.  Currently on IV fluid. -LDL 70, A1c 6.2, patient recently had TSH and vitamin B-12  level. -Continue aspirin, statin.  AKI on CKD 4  -AKI likely secondary to hypotension related to meds.   -Creatinine trend as below.  Improving with IV fluid.  Continue normal saline at 75 mL/h.   Recent Labs    06/10/20 1433 08/07/20 1559 10/19/20 0001 10/19/20 0644 10/20/20 0439  BUN 27 27 35* 36* 31*  CREATININE 2.66* 2.56* 3.31* 3.35* 2.97*   Essential hypertension -patient's blood pressure was being monitored and meds adjusted by her PCP as an outpatient. One of her blood pressure medications Procardia was doubled from 30 mg to 60 mg recently.  Patient was experiencing dizziness especially on getting up.  -Other home medication include Coreg and lisinopril.   -Currently on Coreg and Procardia at a lower dose of 30 mg daily.  Lisinopril on hold. -Blood pressure slowly trending up.  Negative for orthostatic vital signs probably because she was already on IV fluid.  Continue to monitor for next 24 hours  Acute on chronic anemia -Chronic low hemoglobin on iron supplement. -However further drop in hemoglobin to 7.8 this admission.  Dilution versus real GI bleeding.  Patient is having black stool per RN which could be secondary to iron supplement but I would recheck FOBT and repeat hemoglobin tomorrow. Recent Labs    06/10/20 1433 06/10/20 1433 08/07/20 1559 10/19/20 0001 10/19/20 0001 10/19/20 0644 10/20/20 0439  HGB 9.7*  --  10.5* 8.9*  --  7.8* 7.7*  MCV 85   < > 89 93.6   < > 93.5 92.5  VITAMINB12 405  --   --   --   --   --   --    < > =  values in this interval not displayed.     Hypokalemia -Potassium 3.1 today.  Oral replacement ordered. Recent Labs  Lab 10/19/20 0001 10/19/20 0644 10/20/20 0439  K 4.0 3.3* 3.1*  MG  --   --  1.9  PHOS  --   --  3.6   Chronic thrombocytopenia -No active bleeding. Recent Labs  Lab 10/19/20 0001 10/19/20 0644 10/20/20 0439  PLT 119* 149* 151   History of complete heart block status post pacemaker placement.  History  of breast cancer in remission.   Mobility: PT eval pending. Code Status:   Code Status: Full Code  Nutritional status: Body mass index is 23.51 kg/m.     Diet Order            Diet heart healthy/carb modified Room service appropriate? Yes; Fluid consistency: Thin  Diet effective now                 DVT prophylaxis: heparin injection 5,000 Units Start: 10/19/20 1400   Antimicrobials:  None Fluid: Normal saline at 75 mL/h Consultants: Neurology Family Communication:  None at bedside  Status is: Observation  The patient will require care spanning > 2 midnights and should be moved to inpatient because: AKI, stroke work-up pending  Dispo: The patient is from: Home              Anticipated d/c is to: Home versus SNF ending PT eval              Anticipated d/c date is: 1 to 2 days              Patient currently is not medically stable to d/c.       Infusions:  . sodium chloride 75 mL/hr at 10/19/20 2247    Scheduled Meds: . aspirin EC  81 mg Oral Daily  . atorvastatin  80 mg Oral Daily  . carvedilol  25 mg Oral BID WC  . donepezil  10 mg Oral QHS  . dorzolamide-timolol  1 drop Both Eyes BID  . ferrous sulfate  325 mg Oral Q breakfast  . heparin  5,000 Units Subcutaneous Q8H  . insulin aspart  0-6 Units Subcutaneous TID WC  . insulin glargine  4 Units Subcutaneous Daily  . latanoprost  1 drop Right Eye QHS  . NIFEdipine  30 mg Oral Daily  . pantoprazole (PROTONIX) IV  40 mg Intravenous Q12H    Antimicrobials: Anti-infectives (From admission, onward)   None      PRN meds: acetaminophen **OR** acetaminophen, hydrALAZINE   Objective: Vitals:   10/20/20 0824 10/20/20 1156  BP: (!) 170/73 (!) 161/72  Pulse: 74 (!) 59  Resp: 18 18  Temp: 100.3 F (37.9 C) 98.9 F (37.2 C)  SpO2: 100% 91%    Intake/Output Summary (Last 24 hours) at 10/20/2020 1200 Last data filed at 10/20/2020 0043 Gross per 24 hour  Intake 449.27 ml  Output 450 ml  Net -0.73 ml    Filed Weights   10/18/20 2325 10/19/20 1106  Weight: 70.3 kg 68.1 kg   Weight change: -2.208 kg Body mass index is 23.51 kg/m.   Physical Exam: General exam: Appears calm and comfortable.  Not in physical distress Skin: No rashes, lesions or ulcers. HEENT: Atraumatic, normocephalic, supple neck, no obvious bleeding Lungs: Clear to auscultate bilaterally CVS: Regular rate and rhythm, no murmur GI/Abd soft, nontender, nondistended, bowel sound present CNS: Alert, awake, oriented x3 Psychiatry: Mood appropriate Extremities: No pedal edema, no calf  tenderness  Data Review: I have personally reviewed the laboratory data and studies available.  Recent Labs  Lab 10/19/20 0001 10/19/20 0644 10/20/20 0439  WBC 6.5 6.1 4.8  NEUTROABS 4.9 4.3 2.9  HGB 8.9* 7.8* 7.7*  HCT 29.1* 25.8* 24.5*  MCV 93.6 93.5 92.5  PLT 119* 149* 151   Recent Labs  Lab 10/19/20 0001 10/19/20 0644 10/20/20 0439  NA 143 144 145  K 4.0 3.3* 3.1*  CL 113* 116* 118*  CO2 19* 19* 20*  GLUCOSE 207* 135* 81  BUN 35* 36* 31*  CREATININE 3.31* 3.35* 2.97*  CALCIUM 8.6* 8.2* 8.1*  MG  --   --  1.9  PHOS  --   --  3.6    F/u labs ordered  Signed, Terrilee Croak, MD [facility] 10/20/2020

## 2020-10-20 NOTE — Evaluation (Signed)
Speech Language Pathology Evaluation Patient Details Name: Desiree Pollard MRN: [id number] DOB: Oct 22, 1946 Today's Date: 10/20/2020 Time: 2671-2458 SLP Time Calculation (min) (ACUTE ONLY): 20 min  Problem List:  Patient Active Problem List   Diagnosis Date Noted  . ARF (acute renal failure) ([place]) 10/19/2020  . Dizziness 10/19/2020  . Thrombocytopenia ([place]) 10/19/2020  . Chronic diarrhea 09/18/2020  . Loss of weight 09/18/2020  . Dysphagia 09/18/2020  . Insulin dependent type 2 diabetes mellitus ([place]) 06/16/2020  . Hemoglobin A1c less than 7.0% 06/16/2020  . Hyperglycemia 06/16/2020  . Hypertension 06/16/2020  . History of stroke 06/16/2020   Past Medical History:  Past Medical History:  Diagnosis Date  . Breast cancer ([place])    2017  . CVA (cerebral vascular accident) ([place]) 11/2018  . Dehydration 07/2020  . Diabetes mellitus without complication ([place])   . Diabetic retinopathy ([place])    PDR OU  . Frequent diarrhea 07/2020  . Hyperkalemia 07/2020  . Hypertensive retinopathy    OU  . Stroke [facility])    Past Surgical History:  Past Surgical History:  Procedure Laterality Date  . CATARACT EXTRACTION Right   . MASTECTOMY Bilateral    "2017" Left Mastectomy; "2019" Right Mastectomy  . PACEMAKER IMPLANT  2017   HPI:  Desiree Pollard is a 74 y.o. female with history of chronic kidney disease stage IV baseline creatinine around 2.5, complete heart block status post pacemaker placement, history of breast cancer in remission, diabetes mellitus type 2, hypertension, chronic anemia admitted due to experiencing dizziness.  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  Patient blood work also shows new thrombocytopenia with mild worsening of anemia from usual around 10 to 8.   Assessment / Plan / Recommendation Clinical Impression  Patient presents with a mild flaccid dysarthria and a mild cognitive deficit. Speech is 90%  intelligible at sentence level but quality of speech is impacted with imprecise consonant phoneme production. Patient appears with adequate orientation to time/place/situation, adequate reasoning skills but with impaired short term memory, specifically with retrieval. Patient with awareness to this, as she said she normally doesn't have so much trouble remembering what she wants to say, etc. Patient does not appear to be a safety risk to herself or others from a cognitive standpoint but would benefit from SNF level SLP intervention to work on dysarthria and memory.    SLP Assessment  SLP Recommendation/Assessment: All further Speech Lanaguage Pathology  needs can be addressed in the next venue of care SLP Visit Diagnosis: Cognitive communication deficit (R41.841);Dysarthria and anarthria (R47.1)    Follow Up Recommendations  Skilled Nursing facility    Frequency and Duration   N/A        SLP Evaluation Cognition  Overall Cognitive Status: Impaired/Different from baseline Arousal/Alertness: Awake/alert Orientation Level: Oriented X4 Attention: Sustained Sustained Attention: Appears intact Memory: Impaired Memory Impairment: Retrieval deficit;Other (comment) (delayed recall of 2/5 words without cues and 5/5 with 3-choice cue) Safety/Judgment: Appears intact       Comprehension  Auditory Comprehension Overall Auditory Comprehension: Appears within functional limits for tasks assessed    Expression Expression Primary Mode of Expression: Verbal Verbal Expression Overall Verbal Expression: Appears within functional limits for tasks assessed   Oral / Motor  Oral Motor/Sensory Function Overall Oral Motor/Sensory Function: Mild impairment Facial ROM: Reduced left Facial Symmetry: Abnormal symmetry left Facial Strength: Within Functional Limits Lingual ROM: Within Functional Limits Lingual Symmetry: Within Functional Limits Lingual Strength: Within  Functional Limits Motor  Speech Overall Motor Speech: Impaired Respiration: Within functional limits Phonation: Normal Resonance: Within functional limits Articulation: Impaired Level of Impairment: Phrase Intelligibility: Intelligibility reduced Word: 75-100% accurate Phrase: 75-100% accurate Sentence: 75-100% accurate Conversation: 75-100% accurate Motor Planning: Witnin functional limits Motor Speech Errors: Not applicable Effective Techniques: Slow rate   GO                    [name], MA, CCC-SLP Speech Therapy

## 2020-10-20 NOTE — TOC Initial Note (Signed)
Transition of Care [facility]) - Initial/Assessment Note    Patient Details  Name: Desiree Pollard MRN: [id number] Date of Birth: [birth year]  [facility]) CM/SW Contact:    Coralee Pesa, [place] Phone Number: 10/20/2020, 1:50 PM  Clinical Narrative:                 CSW spoke with pt at bedside and explained the recommendation for SNF placement and pt was agreeable to placement. Pt had no preference for facility and agreed to fax out. She stated she would like to be close to [place] if possible. Pt has not received Covid vaccines. Will complete workup.  Expected Discharge Plan: Skilled Nursing Facility Barriers to Discharge: Continued Medical Work up, SNF Pending bed offer   Patient Goals and CMS Choice Patient states their goals for this hospitalization and ongoing recovery are:: Pt is agreeable to SNF placement. CMS Medicare.gov Compare Post Acute Care list provided to:: Patient Choice offered to / list presented to : Patient  Expected Discharge Plan and Services Expected Discharge Plan: [place]       Living arrangements for the past 2 months: Single Family Home                                      Prior Living Arrangements/Services Living arrangements for the past 2 months: Single Family Home Lives with:: Adult Children Patient language and need for interpreter reviewed:: Yes Do you feel safe going back to the place where you live?: Yes      Need for Family Participation in Patient Care: No (Comment) Care giver support system in place?: No (comment)   Criminal Activity/Legal Involvement Pertinent to Current Situation/Hospitalization: No - Comment as needed  Activities of Daily Living      Permission Sought/Granted Permission sought to share information with : Family Supports Permission granted to share information with : No              Emotional Assessment Appearance:: Appears stated age Attitude/Demeanor/Rapport:  Engaged Affect (typically observed): Appropriate Orientation: : Oriented to Self, Oriented to Place, Oriented to  Time, Oriented to Situation Alcohol / Substance Use: Not Applicable Psych Involvement: No (comment)  Admission diagnosis:  Dizziness [R42] ARF (acute renal failure) ([facility]) [N17.9] AKI (acute kidney injury) ([place]) [N17.9] Patient Active Problem List   Diagnosis Date Noted  . ARF (acute renal failure) ([place]) 10/19/2020  . Dizziness 10/19/2020  . Thrombocytopenia ([place]) 10/19/2020  . Chronic diarrhea 09/18/2020  . Loss of weight 09/18/2020  . Dysphagia 09/18/2020  . Insulin dependent type 2 diabetes mellitus ([place]) 06/16/2020  . Hemoglobin A1c less than 7.0% 06/16/2020  . Hyperglycemia 06/16/2020  . Hypertension 06/16/2020  . History of stroke 06/16/2020   PCP:  Patient, No Pcp Per Pharmacy:   [id number] - [place], [address] [place] [address] Phone: [phone] Fax: [phone]     Social Determinants of Health (SDOH) Interventions    Readmission Risk Interventions No flowsheet data found.

## 2020-10-21 ENCOUNTER — Inpatient Hospital Stay (HOSPITAL_COMMUNITY): Payer: Medicare HMO

## 2020-10-21 DIAGNOSIS — R42 Dizziness and giddiness: Secondary | ICD-10-CM | POA: Diagnosis not present

## 2020-10-21 LAB — CBC WITH DIFFERENTIAL/PLATELET
Abs Immature Granulocytes: 0.02 10*3/uL (ref 0.00–0.07)
Basophils Absolute: 0 10*3/uL (ref 0.0–0.1)
Basophils Relative: 1 %
Eosinophils Absolute: 0.2 10*3/uL (ref 0.0–0.5)
Eosinophils Relative: 3 %
HCT: 24.9 % — ABNORMAL LOW (ref 36.0–46.0)
Hemoglobin: 7.6 g/dL — ABNORMAL LOW (ref 12.0–15.0)
Immature Granulocytes: 0 %
Lymphocytes Relative: 25 %
Lymphs Abs: 1.3 10*3/uL (ref 0.7–4.0)
MCH: 28.5 pg (ref 26.0–34.0)
MCHC: 30.5 g/dL (ref 30.0–36.0)
MCV: 93.3 fL (ref 80.0–100.0)
Monocytes Absolute: 0.5 10*3/uL (ref 0.1–1.0)
Monocytes Relative: 9 %
Neutro Abs: 3.2 10*3/uL (ref 1.7–7.7)
Neutrophils Relative %: 62 %
Platelets: 154 10*3/uL (ref 150–400)
RBC: 2.67 MIL/uL — ABNORMAL LOW (ref 3.87–5.11)
RDW: 13.8 % (ref 11.5–15.5)
WBC: 5.1 10*3/uL (ref 4.0–10.5)
nRBC: 0 % (ref 0.0–0.2)

## 2020-10-21 LAB — GLUCOSE, CAPILLARY
Glucose-Capillary: 131 mg/dL — ABNORMAL HIGH (ref 70–99)
Glucose-Capillary: 170 mg/dL — ABNORMAL HIGH (ref 70–99)
Glucose-Capillary: 106 mg/dL — ABNORMAL HIGH (ref 70–99)
Glucose-Capillary: 144 mg/dL — ABNORMAL HIGH (ref 70–99)

## 2020-10-21 LAB — BASIC METABOLIC PANEL
Anion gap: 5 (ref 5–15)
BUN: 30 mg/dL — ABNORMAL HIGH (ref 8–23)
CO2: 19 mmol/L — ABNORMAL LOW (ref 22–32)
Calcium: 8.2 mg/dL — ABNORMAL LOW (ref 8.9–10.3)
Chloride: 120 mmol/L — ABNORMAL HIGH (ref 98–111)
Creatinine, Ser: 2.88 mg/dL — ABNORMAL HIGH (ref 0.44–1.00)
GFR, Estimated: 16 mL/min — ABNORMAL LOW (ref >60)
Glucose, Bld: 133 mg/dL — ABNORMAL HIGH (ref 70–99)
Potassium: 3.7 mmol/L (ref 3.5–5.1)
Sodium: 144 mmol/L (ref 135–145)

## 2020-10-21 LAB — MAGNESIUM: Magnesium: 1.8 mg/dL (ref 1.7–2.4)

## 2020-10-21 LAB — PHOSPHORUS: Phosphorus: 3.2 mg/dL (ref 2.5–4.6)

## 2020-10-21 IMAGING — MR MR HEAD W/O CM
12 of 13 series · 44 of 48 positions shown · non-contrast
Comparison: CT head [DATE].

CLINICAL DATA: Neuro deficit, acute stroke suspected.

EXAM:
MRI HEAD WITHOUT CONTRAST
TECHNIQUE: Multiplanar, multiecho pulse sequences of the brain and surrounding
structures were obtained without intravenous contrast.

[Series 5: DWI · axial · 3.0mm · 0.88mm/px · z∈[-92,+60]mm · 8 of 104 slices shown (1 of 4)]
[im 1/104]
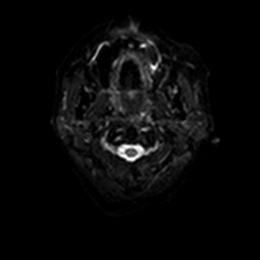
[im 15/104]
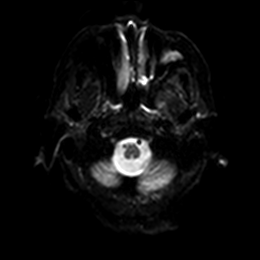
[im 30/104]
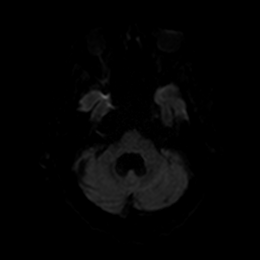
[im 45/104]
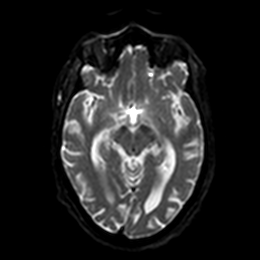
[im 59/104]
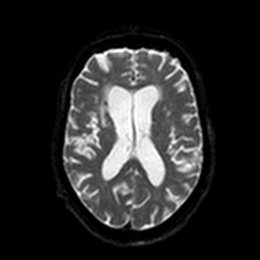
[im 74/104]
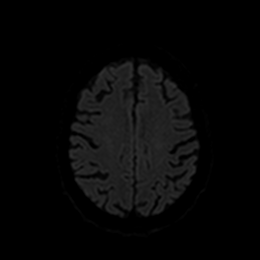
[im 89/104]
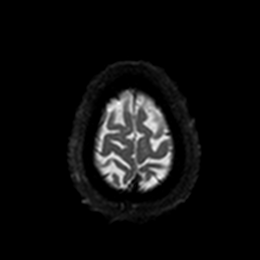
[im 104/104]
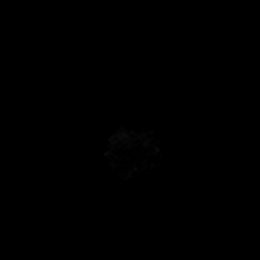

[Series 6: DWI · axial · 3.0mm · 0.88mm/px · z∈[-92,+60]mm · 4 of 52 slices shown (2 of 4)]
[im 1/52]
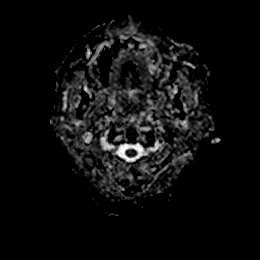
[im 18/52]
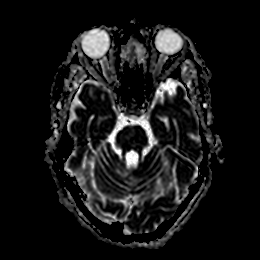
[im 35/52]
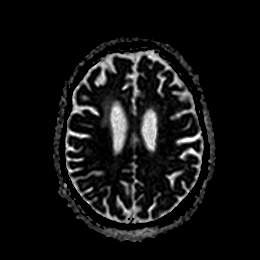
[im 52/52]
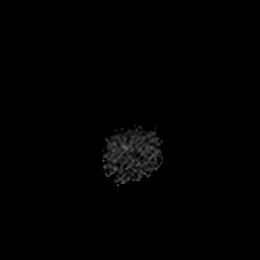

[Series 7: DWI · coronal · 4.0mm · 0.88mm/px · 6 of 76 slices shown (3 of 4)]
[im 1/76]
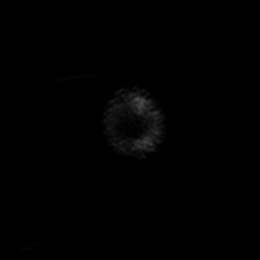
[im 16/76]
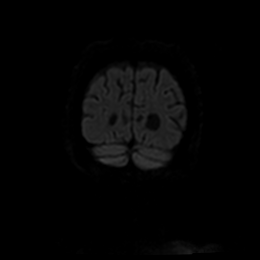
[im 31/76]
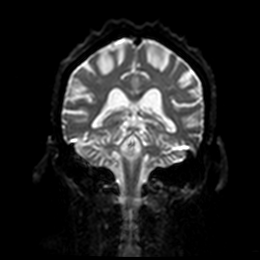
[im 46/76]
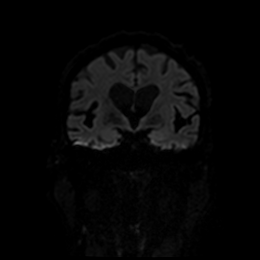
[im 61/76]
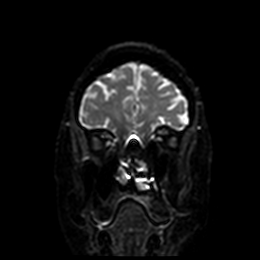
[im 76/76]
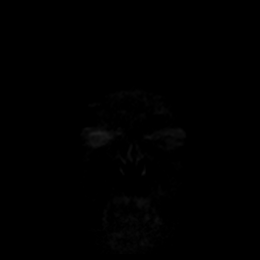

[Series 8: DWI · coronal · 4.0mm · 0.88mm/px · 3 of 38 slices shown (4 of 4)]
[im 1/38]
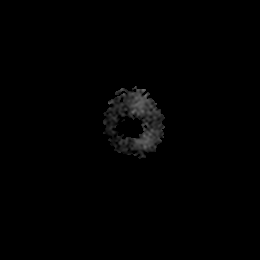
[im 19/38]
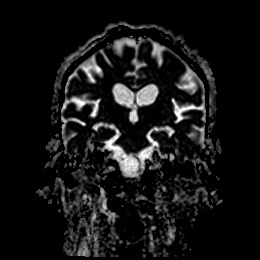
[im 38/38]
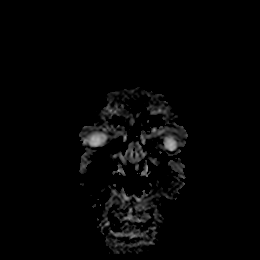

[Series 9: T1 · sagittal · 5.0mm · 0.78mm/px · 1 of 20 slices shown]
[im 1/20]
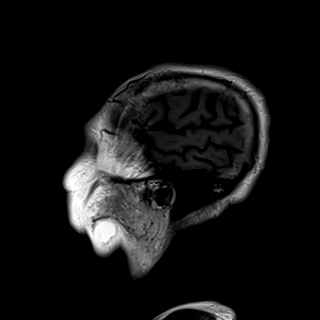

[Series 10: T2 · axial · 5.0mm · 0.72mm/px · z∈[-95,+49]mm · 2 of 25 slices shown (1 of 2)]
[im 1/25]
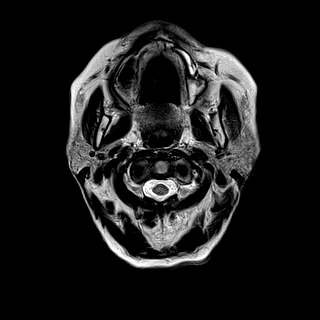
[im 25/25]
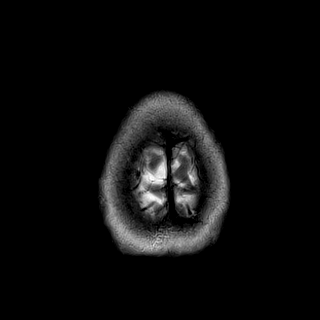

[Series 11: FLAIR · axial · 5.0mm · 0.45mm/px · z∈[-97,+47]mm · 2 of 25 slices shown]
[im 1/25]
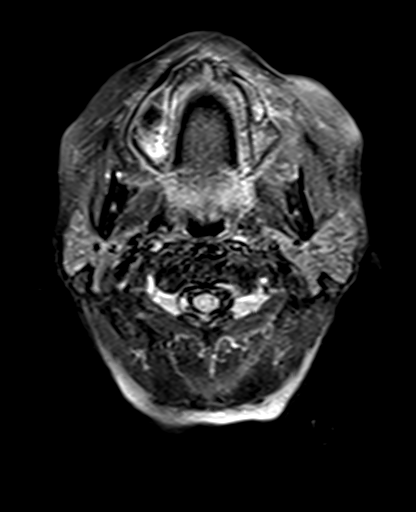
[im 25/25]
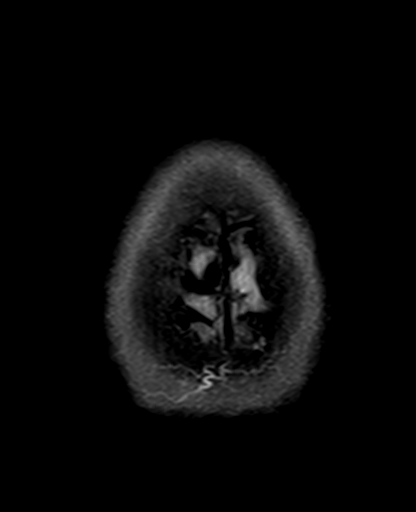

[Series 12: mag_images · axial · 3.0mm · 0.90mm/px · z∈[-102,+74]mm · 4 of 60 slices shown]
[im 1/60]
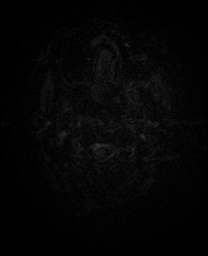
[im 20/60]
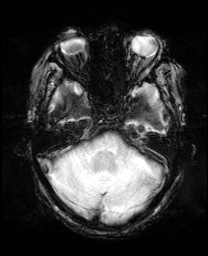
[im 40/60]
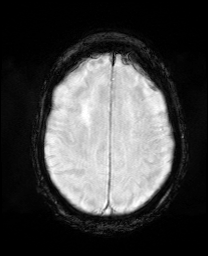
[im 60/60]
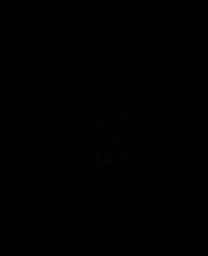

[Series 13: pha_images · axial · 3.0mm · 0.90mm/px · z∈[-102,+65]mm · 4 of 57 slices shown]
[im 1/57]
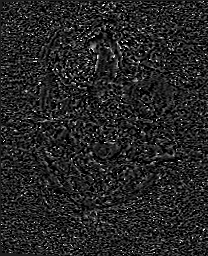
[im 19/57]
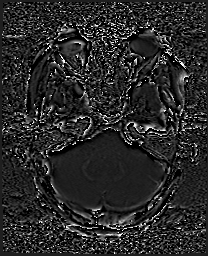
[im 38/57]
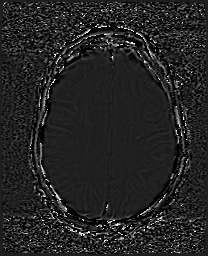
[im 57/57]
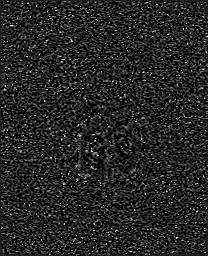

[Series 14: swi_images · axial · 3.0mm · 0.90mm/px · z∈[-102,+74]mm · 4 of 60 slices shown]
[im 1/60]
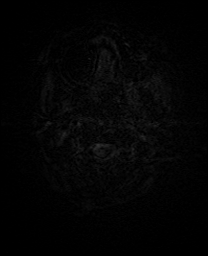
[im 20/60]
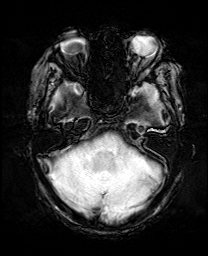
[im 40/60]
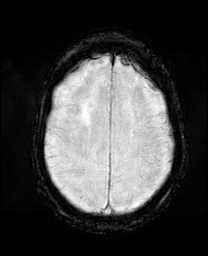
[im 60/60]
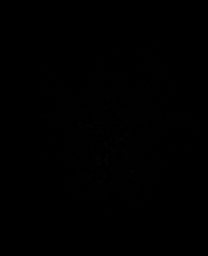

[Series 15: mip_images(sw) · axial · 24.0mm · 0.90mm/px · z∈[-92,+64]mm · 4 of 53 slices shown]
[im 1/53]
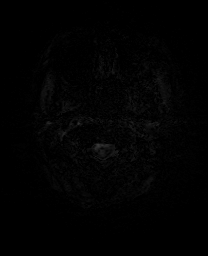
[im 18/53]
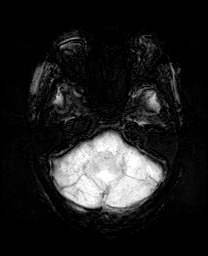
[im 35/53]
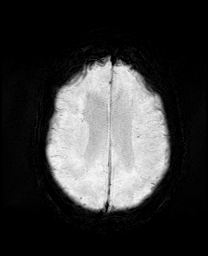
[im 53/53]
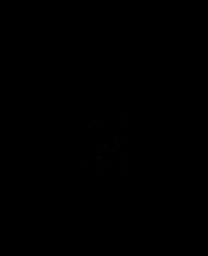

[Series 17: T2 · coronal · 5.0mm · 0.34mm/px · 2 of 29 slices shown (2 of 2)]
[im 1/29]
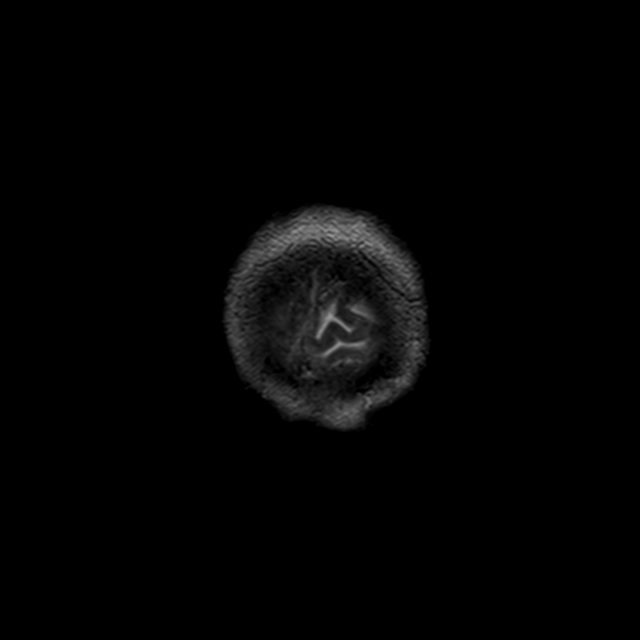
[im 29/29]
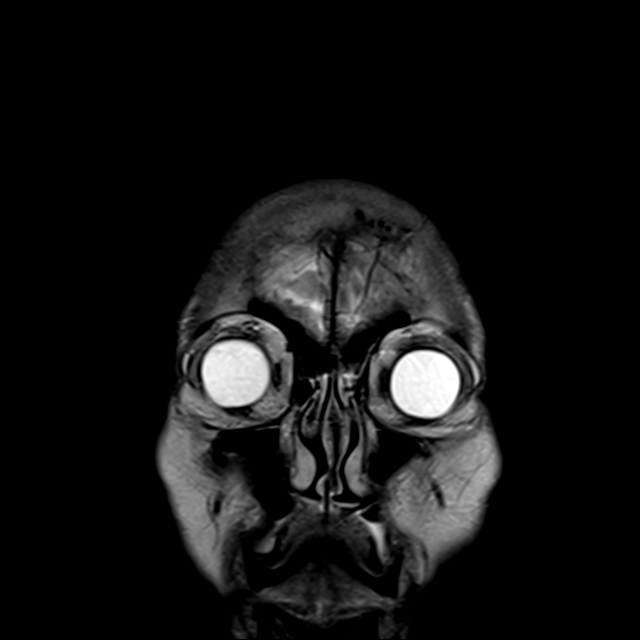

[44 of 48 positions shown; findings below may reference images not displayed]

FINDINGS: Brain: Small acute infarct in the right thalamus. Mild associated
edema without mass effect. No acute hemorrhage. Remote infarcts in
bilateral cerebellar hemispheres, bilateral basal ganglia, and right
frontal white matter. There is ex vacuo dilation of the fourth
ventricle and lateral ventricles. Additional scattered T2/FLAIR
hyperintensities within the white matter are likely the sequela of
chronic microvascular ischemic disease.

Vascular: Major arterial flow voids are maintained at the skull
base.

Skull and upper cervical spine: Normal marrow signal.

Sinuses/Orbits: Atretic left maxillary sinus with mild scattered
paranasal sinus mucosal thickening. Unremarkable orbits.

Other: No mastoid effusions.
IMPRESSION: 1. Small acute infarct in the right thalamus.
2. Multiple remote lacunar infarcts, chronic microvascular ischemic
disease, and generalized cerebral volume loss.

## 2020-10-21 MED ORDER — NIFEDIPINE ER OSMOTIC RELEASE 60 MG PO TB24
60.0000 mg | ORAL_TABLET | Freq: Every day | ORAL | Status: DC
  Administered 2020-10-22 – 2020-10-28 (×7): 60 mg via ORAL
  Filled 2020-10-21 (×7): qty 1

## 2020-10-21 MED ORDER — NIFEDIPINE ER OSMOTIC RELEASE 30 MG PO TB24
30.0000 mg | ORAL_TABLET | Freq: Once | ORAL | Status: AC
  Administered 2020-10-21: 30 mg via ORAL
  Filled 2020-10-21: qty 1

## 2020-10-21 NOTE — Progress Notes (Signed)
Per order, changed device settings for MRI to  DOO at 70bpm  Will program device back to pre-MRI settings after completion of exam and send transmission.

## 2020-10-21 NOTE — Progress Notes (Addendum)
PROGRESS NOTE  Desiree Pollard  DOB: 12-20-1945  PCP: Patient, No Pcp Per JKD:[id number]  DOA: 10/18/2020  LOS: 2 days   Chief Complaint  Patient presents with  . Dizziness   Brief narrative: Desiree Pollard is a 74 y.o. 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 Patient presented to the ED on 11/5 with complaint of persistent dizziness for few days.  Apparently, patient's blood pressure was being monitored and meds adjusted by her PCP as an outpatient. One of her blood pressure medications Procardia was doubled from 30 mg to 60 mg recently.  Patient was experiencing dizziness especially on getting up.  On the day of admission, family noted that she was drooling and did not notice.    CT scan of head did not show any acute abnormality but showed stable right frontal encephalomalacia and bilateral lacunar type infarcts in the basal ganglia and cerebellar hemispheres. Labs showed creatinine elevated to 3.31, hemoglobin 8.9, platelet 119  Patient was admitted for further evaluation. Also noted to be positive for orthostatic blood pressure drop.  Subjective: Patient was seen and examined this morning. Lying in bed.  Not in distress.  Seems weak.  Assessment/Plan: Acute onset dizziness -Persistent symptoms of dizziness especially on getting up for 2 to 3 days in a background of previous history of right cerebellar area stroke -MRI pending to rule out acute ischemic event. -History is more suggestive of orthostatic hypotension.  Adequately hydrated. -LDL 70, A1c 6.2, patient recently had TSH and vitamin B-12 level. -Continue aspirin, statin. -PT eval obtained.  SNF recommended.  AKI on CKD 4  -AKI likely secondary to hypotension related to meds.   -Creatinine trend as below.  Improving with IV fluid.  Patient is already  having pedal edema and blood pressure surge.  I will stop IV fluid today. Recent Labs    06/10/20 1433 08/07/20 1559 10/19/20 0001 10/19/20 0644 10/20/20 0439 10/21/20 0125  BUN 27 27 35* 36* 31* 30*  CREATININE 2.66* 2.56* 3.31* 3.35* 2.97* 2.88*   Essential hypertension -Prior to admission, patient was not Coreg, lisinopril and Procardia, reportedly Procardia dose was recently increased after which patient started getting dizzy.   -Currently she is on Coreg and Procardia are at the lower dose at 30 mg daily.   -Blood pressure is rising up, up to 197 this morning.   -I will increase Procardia back to 60 mg daily but keep lisinopril on hold.   -Stop IV fluid.  Diabetes mellitus type 2 -Last A1c 6.2 on 6/28 -Takes Lantus 4 units daily at home. -Currently blood sugar is controlled on same with sliding scale insulin with Accu-Cheks. Recent Labs  Lab 10/19/20 1634 10/19/20 2215 10/20/20 0613 10/20/20 1157 10/20/20 1654  GLUCAP 151* 88 79 126* 141*   Acute on chronic anemia -Chronic low hemoglobin on iron supplement. -Currently globin is stable between 7-8.  No active bleeding noted.  Black stool probably secondary to iron supplement.  Continue to monitor. Recent Labs    06/10/20 1433 06/10/20 1433 08/07/20 1559 10/19/20 0001 10/19/20 0001 10/19/20 0644 10/19/20 0644 10/20/20 0439 10/21/20 0125  HGB 9.7*   < > 10.5* 8.9*  --  7.8*  --  7.7* 7.6*  MCV 85   < > 89 93.6   < > 93.5   < > 92.5 93.3  VITAMINB12 405  --   --   --   --   --   --   --   --    < > = values in this interval not displayed.   Hypokalemia -Potassium 3.1 today.  Oral replacement ordered. Recent Labs  Lab 10/19/20 0001 10/19/20 0644 10/20/20 0439 10/21/20 0125  K 4.0 3.3* 3.1* 3.7  MG  --   --  1.9 1.8  PHOS  --   --  3.6 3.2   Chronic thrombocytopenia -No active bleeding. Recent Labs  Lab 10/19/20 0001 10/19/20 0644 10/20/20 0439 10/21/20 0125  PLT 119* 149* 151 154   History of  complete heart block status post pacemaker placement.  History of breast cancer in remission.   Mobility: PT eval pending. Code Status:   Code Status: Full Code  Nutritional status: Body mass index is 23.51 kg/m.     Diet Order            Diet heart healthy/carb modified Room service appropriate? Yes; Fluid consistency: Thin  Diet effective now                 DVT prophylaxis: heparin injection 5,000 Units Start: 10/19/20 1400   Antimicrobials:  None Fluid: Okay to stop IV fluid Consultants: Neurology Family Communication:  None at bedside  Status is: Inpatient  Remains inpatient appropriate because:Unsafe d/c plan  Dispo: The patient is from: Home              Anticipated d/c is to: SNF              Anticipated d/c date is: Whenever bed is available              Patient currently is medically stable to d/c.   Infusions:    Scheduled Meds: . aspirin EC  81 mg Oral Daily  . atorvastatin  80 mg Oral Daily  . carvedilol  25 mg Oral BID WC  . donepezil  10 mg Oral QHS  . dorzolamide-timolol  1 drop Both Eyes BID  . ferrous sulfate  325 mg Oral Q breakfast  . heparin  5,000 Units Subcutaneous Q8H  . insulin aspart  0-6 Units Subcutaneous TID WC  . insulin glargine  4 Units Subcutaneous Daily  . latanoprost  1 drop Right Eye QHS  . NIFEdipine  30 mg Oral Daily  . pantoprazole (PROTONIX) IV  40 mg Intravenous Q12H    Antimicrobials: Anti-infectives (From admission, onward)   None      PRN meds: acetaminophen **OR** acetaminophen, hydrALAZINE   Objective: Vitals:   10/21/20 0024 10/21/20 0421  BP: (!) 167/72 (!) 171/69  Pulse: 66 70  Resp: 18 18  Temp: 98.7 F (37.1 C) 98.6 F (37 C)  SpO2: 100% 98%    Intake/Output Summary (Last 24 hours) at 10/21/2020 0804 Last data filed at 10/20/2020 2013 Gross per 24 hour  Intake 2731.82 ml  Output 1000 ml  Net 1731.82 ml   Filed Weights   10/18/20 2325 10/19/20 1106  Weight: 70.3 kg 68.1 kg   Weight  change:  Body mass index is 23.51 kg/m.   Physical Exam: General exam: Appears calm and comfortable. Not in physical distress Skin: No rashes, lesions or ulcers. HEENT: Atraumatic, normocephalic, supple neck, no obvious bleeding Lungs: Clear to auscultation bilaterally CVS: Regular rate and rhythm, no murmur GI/Abd soft, nontender, nondistended, bowel sound present CNS: Alert, awake, oriented x3 Psychiatry: Mood appropriate Extremities: Trace bilateral pedal edema,  no calf tenderness  Data Review: I have personally reviewed the laboratory data and studies available.  Recent Labs  Lab 10/19/20 0001 10/19/20 0644 10/20/20 0439 10/21/20 0125  WBC 6.5 6.1 4.8 5.1  NEUTROABS 4.9 4.3 2.9 3.2  HGB 8.9* 7.8* 7.7* 7.6*  HCT 29.1* 25.8* 24.5* 24.9*  MCV 93.6 93.5 92.5 93.3  PLT 119* 149* 151 154   Recent Labs  Lab 10/19/20 0001 10/19/20 0644 10/20/20 0439 10/21/20 0125  NA 143 144 145 144  K 4.0 3.3* 3.1* 3.7  CL 113* 116* 118* 120*  CO2 19* 19* 20* 19*  GLUCOSE 207* 135* 81 133*  BUN 35* 36* 31* 30*  CREATININE 3.31* 3.35* 2.97* 2.88*  CALCIUM 8.6* 8.2* 8.1* 8.2*  MG  --   --  1.9 1.8  PHOS  --   --  3.6 3.2    F/u labs ordered  Signed, Terrilee Croak, MD [facility] 10/21/2020

## 2020-10-21 NOTE — NC FL2 (Signed)
[place] LEVEL OF CARE SCREENING TOOL     IDENTIFICATION  Patient Name: Desiree Pollard Birthdate: 09-19-1946 Sex: female Admission Date (Current Location): 10/18/2020  [facility] and [place] Number:  Herbalist and Address:  [facility], [place] [address] [place], [place] [address]      Provider Number: [id number]  Attending Physician Name and Address:  Terrilee Croak, MD  Relative Name and Phone Number:  Trudi Ida, sister, [place]    Current Level of Care: Hospital Recommended Level of Care: [place] Prior Approval Number:    Date Approved/Denied:   PASRR Number: [id number]  Discharge Plan: SNF    Current Diagnoses: Patient Active Problem List   Diagnosis Date Noted  . ARF (acute renal failure) ([place]) 10/19/2020  . Dizziness 10/19/2020  . Thrombocytopenia ([place]) 10/19/2020  . Chronic diarrhea 09/18/2020  . Loss of weight 09/18/2020  . Dysphagia 09/18/2020  . Insulin dependent type 2 diabetes mellitus ([place]) 06/16/2020  . Hemoglobin A1c less than 7.0% 06/16/2020  . Hyperglycemia 06/16/2020  . Hypertension 06/16/2020  . History of stroke 06/16/2020    Orientation RESPIRATION BLADDER Height & Weight     Self, Time, Situation, Place  Normal External catheter Weight: 150 lb 2.1 oz (68.1 kg) Height:  5\' 7"  (170.2 cm)  BEHAVIORAL SYMPTOMS/MOOD NEUROLOGICAL BOWEL NUTRITION STATUS      Continent Diet (See discharge summary)  AMBULATORY STATUS COMMUNICATION OF NEEDS Skin   Limited Assist Verbally Surgical wounds, Other (Comment) (Double masectomy and pacemaker)                       Personal Care Assistance Level of Assistance  Bathing, Feeding, Dressing Bathing Assistance: Limited assistance Feeding assistance: Independent Dressing Assistance: Limited assistance     Functional Limitations Info  Sight, Hearing, Speech Sight Info: Adequate Hearing Info: Adequate Speech Info: Adequate     SPECIAL CARE FACTORS FREQUENCY  PT (By licensed PT), OT (By licensed OT)     PT Frequency: 5x week OT Frequency: 5x week            Contractures Contractures Info: Not present    Additional Factors Info  Code Status, Allergies, Insulin Sliding Scale Code Status Info: Full Allergies Info: Amlodipine   Insulin Sliding Scale Info: Insulin Aspart (Novolog) 0-6 U 3x daily w/ meals; Insulin Glargine (Lantus) 4U daily       Current Medications (10/21/2020):  This is the current hospital active medication list Current Facility-Administered Medications  Medication Dose Route Frequency Provider Last Rate Last Admin  . acetaminophen (TYLENOL) tablet 650 mg  650 mg Oral Q6H PRN Rise Patience, MD       Or  . acetaminophen (TYLENOL) suppository 650 mg  650 mg Rectal Q6H PRN Rise Patience, MD      . aspirin EC tablet 81 mg  81 mg Oral Daily Rise Patience, MD   81 mg at 10/21/20 0858  . atorvastatin (LIPITOR) tablet 80 mg  80 mg Oral Daily Rise Patience, MD   80 mg at 10/21/20 0858  . carvedilol (COREG) tablet 25 mg  25 mg Oral BID WC Rise Patience, MD   25 mg at 10/21/20 0858  . donepezil (ARICEPT) tablet 10 mg  10 mg Oral QHS Rise Patience, MD   10 mg at 10/20/20 2127  . dorzolamide-timolol (COSOPT) 22.3-6.8 MG/ML ophthalmic solution 1 drop  1 drop Both Eyes BID Hal Hope,  Doreatha Lew, MD   1 drop at 10/20/20 2128  . ferrous sulfate tablet 325 mg  325 mg Oral Q breakfast Rise Patience, MD   325 mg at 10/21/20 0858  . heparin injection 5,000 Units  5,000 Units Subcutaneous Q8H Rise Patience, MD   5,000 Units at 10/21/20 0543  . hydrALAZINE (APRESOLINE) injection 10 mg  10 mg Intravenous Q4H PRN Rise Patience, MD      . insulin aspart (novoLOG) injection 0-6 Units  0-6 Units Subcutaneous TID WC Rise Patience, MD   1 Units at 10/19/20 1723  . insulin glargine (LANTUS) injection 4 Units  4 Units Subcutaneous Daily Rise Patience, MD   4 Units at 10/20/20 0940  . latanoprost (XALATAN) 0.005 % ophthalmic solution 1 drop  1 drop Right Eye QHS Rise Patience, MD   1 drop at 10/20/20 2127  . NIFEdipine (PROCARDIA-XL/NIFEDICAL-XL) 24 hr tablet 30 mg  30 mg Oral Daily Rise Patience, MD   30 mg at 10/20/20 0940  . pantoprazole (PROTONIX) injection 40 mg  40 mg Intravenous Q12H Terrilee Croak, MD   40 mg at 10/21/20 8887     Discharge Medications: Please see discharge summary for a list of discharge medications.  Relevant Imaging Results:  Relevant Lab Results:   Additional Information SS# [place], [place]

## 2020-10-21 NOTE — TOC Progression Note (Signed)
Transition of Care [facility]) - Progression Note    Patient Details  Name: Desiree Pollard MRN: [id number] Date of Birth: 01-07-1946  Transition of Care [facility]) CM/SW [place], [place] Phone Number: 10/21/2020, 12:52 PM  Clinical Narrative:   CSW met with patient earlier today to provide bed offers. Patient said she wanted to be at [facility], because that's where her family is located. CSW reached out to [facility] and secured bed offer, they will initiate insurance authorization through [facility].  UPDATE: CSW later updated by OT that sister had arrived, and they are no longer interested in SNF placement. They will take patient home with home health services instead.     Expected Discharge Plan: [place] Barriers to Discharge: Continued Medical Work up, SNF Pending bed offer  Expected Discharge Plan and Services Expected Discharge Plan: [place] arrangements for the past 2 months: Single Family Home                                       Social Determinants of Health (SDOH) Interventions    Readmission Risk Interventions No flowsheet data found.

## 2020-10-21 NOTE — Progress Notes (Signed)
Occupational Therapy Evaluation Patient Details Name: Desiree Pollard MRN: [id number] DOB: 1946/12/08 Today's Date: 10/21/2020    History of Present Illness Desiree Pollard is a 74 y.o. female with history of chronic kidney disease stage IV baseline creatinine around 2.5, complete heart block status post pacemaker placement, history of breast cancer in remission, diabetes mellitus type 2, hypertension, chronic anemia admitted due to experiencing dizziness.  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  Patient blood work also shows new thrombocytopenia with mild worsening of anemia from usual around 10 to 8.   Clinical Impression   Pt pleasant, Ox3, endorsing prior to admission participating with skilled therapy via [facility] services, was using RW for mobility, and completing basic ADL tasks at what appears as Sup. Sister present at end of session reporting that pts self report was accurate. Sister is also preferential for d/c to home with [facility] therapy services to resume. OT completed education on reasons for recommendations for d/c to post acute at this time. Pt currently demo's decreased activity tolerance, balance and strength grossly leading to need for mod A for transfers at bed level, sit>standing x3 this date and for pivot transfer to recliner ranging from min-mod A for safety. Pt requiring mod A for standing ADL's, min A for seated. OT will continue to follow, with recommendations listed below.     Follow Up Recommendations  SNF;Supervision/Assistance - 24 hour (Family with preference for d/c to home with [facility] services )    Equipment Recommendations  Tub/shower bench    Recommendations for Other Services       Precautions / Restrictions Precautions Precautions: Fall Restrictions Weight Bearing Restrictions: No      Mobility Bed Mobility Overal bed mobility: Needs Assistance Bed Mobility: Supine to Sit     Supine to sit: HOB  elevated;Mod assist     General bed mobility comments: A for both LE's off of bed and hand hold A for pulling trunk to sitting    Transfers Overall transfer level: Needs assistance Equipment used: Rolling walker (2 wheeled) Transfers: Sit to/from [facility] Sit to Stand: Mod assist;From elevated surface Stand pivot transfers: Mod assist       General transfer comment: A for transition to standing, once standing cues for weight shift and slowed shuffling of feet for transfer need for near constant Mod A for safe transition to recliner    Balance Overall balance assessment: Needs assistance Sitting-balance support: Single extremity supported;Feet supported Sitting balance-Leahy Scale: Fair    ADL either performed or assessed with clinical judgement   ADL Overall ADL's : Needs assistance/impaired Eating/Feeding: Set up   Grooming: Minimal assistance;Sitting   Upper Body Bathing: Minimal assistance;Sitting   Lower Body Bathing: Moderate assistance   Upper Body Dressing : Minimal assistance   Lower Body Dressing: Moderate assistance;Maximal assistance;Sit to/from stand   Toilet Transfer: Moderate assistance;RW   Toileting- Clothing Manipulation and Hygiene: Moderate assistance;Sit to/from stand;Cueing for safety;Cueing for sequencing General ADL Comments: need for cues for correction of posterior lean, decreased righting reactions noted     Vision Baseline Vision/History: Wears glasses Patient Visual Report: Blurring of vision (pt with watering of eyes, difficulty with description sight) Vision Assessment?: Vision impaired- to be further tested in functional context     Perception     Praxis      Pertinent Vitals/Pain Pain Assessment: No/denies pain     Hand Dominance Right   Extremity/Trunk Assessment Upper Extremity Assessment  Upper Extremity Assessment: Generalized weakness   Lower Extremity Assessment Lower Extremity Assessment: Defer to  PT evaluation   Cervical / Trunk Assessment Cervical / Trunk Assessment: Kyphotic   Communication Communication Communication: No difficulties   Cognition Arousal/Alertness: Awake/alert Behavior During Therapy: WFL for tasks assessed/performed Overall Cognitive Status: Impaired/Different from baseline Area of Impairment: Attention;Safety/judgement;Problem solving                   Current Attention Level: Sustained     Safety/Judgement: Decreased awareness of safety;Decreased awareness of deficits   Problem Solving: Slow processing;Decreased initiation;Difficulty sequencing General Comments: slowed response time to request of therapist   General Comments       Exercises     Shoulder Instructions      Home Living Family/patient expects to be discharged to:: Private residence Living Arrangements: Other relatives (sister and neice) Available Help at Discharge: Family Type of Home: House Home Access: Stairs to enter Technical brewer of Steps: 4 Entrance Stairs-Rails: [place]: One level     Bathroom Shower/Tub: Teacher, early years/pre: Standard Bathroom Accessibility: No   Home Equipment: Environmental consultant - 2 wheels      Lives With: Family    Prior Functioning/Environment Level of Independence: Independent with assistive device(s);Needs assistance  Gait / Transfers Assistance Needed: use of RW for mobility ADL's / Homemaking Assistance Needed: sister and neice assist with ADL's occasionally pt with decreased vision         OT Problem List: Decreased strength;Decreased activity tolerance;Impaired balance (sitting and/or standing);Impaired vision/perception;Decreased cognition;Decreased safety awareness;Decreased knowledge of use of DME or AE;Decreased knowledge of precautions      OT Treatment/Interventions: Self-care/ADL training;Therapeutic exercise;Neuromuscular education;Energy conservation;DME and/or AE instruction;Therapeutic  activities;Cognitive remediation/compensation    OT Goals(Current goals can be found in the care plan section) Acute Rehab OT Goals Patient Stated Goal: to get more indep.  OT Goal Formulation: With patient Time For Goal Achievement: 11/04/20 Potential to Achieve Goals: Good ADL Goals Pt Will Perform Grooming: with supervision;standing Pt Will Perform Upper Body Bathing: with supervision;sitting;standing Pt Will Perform Lower Body Bathing: with supervision;sit to/from stand;sitting/lateral leans Pt Will Transfer to Toilet: with supervision;regular height toilet;grab bars;bedside commode Pt Will Perform Toileting - Clothing Manipulation and hygiene: with modified independence;sit to/from stand  OT Frequency: Min 2X/week   Barriers to D/C:            Co-evaluation              AM-PAC OT "6 Clicks" Daily Activity     Outcome Measure Help from another person eating meals?: None Help from another person taking care of personal grooming?: A Little Help from another person toileting, which includes using toliet, bedpan, or urinal?: A Lot Help from another person bathing (including washing, rinsing, drying)?: A Lot Help from another person to put on and taking off regular upper body clothing?: A Little Help from another person to put on and taking off regular lower body clothing?: A Lot 6 Click Score: 16   End of Session Equipment Utilized During Treatment: Gait belt;Rolling walker Nurse Communication: Mobility status  Activity Tolerance: Patient tolerated treatment well Patient left: in chair;with chair alarm set;with call bell/phone within reach  OT Visit Diagnosis: Unsteadiness on feet (R26.81);Muscle weakness (generalized) (M62.81);Low vision, both eyes (H54.2);Other symptoms and signs involving cognitive function                Time: 7106-2694 OT Time Calculation (min): 31 min Charges:  OT General Charges $  OT Visit: 1 Visit OT Evaluation $OT Eval Low Complexity: 1  Low OT Treatments $Self Care/Home Management : 8-22 mins  Quina Wilbourne OTR/L acute rehab services Office: [phone]  Toula Moos 10/21/2020, 12:31 PM

## 2020-10-22 ENCOUNTER — Inpatient Hospital Stay (HOSPITAL_COMMUNITY): Payer: Medicare HMO

## 2020-10-22 DIAGNOSIS — R42 Dizziness and giddiness: Secondary | ICD-10-CM

## 2020-10-22 DIAGNOSIS — I361 Nonrheumatic tricuspid (valve) insufficiency: Secondary | ICD-10-CM

## 2020-10-22 DIAGNOSIS — I6389 Other cerebral infarction: Secondary | ICD-10-CM

## 2020-10-22 DIAGNOSIS — I340 Nonrheumatic mitral (valve) insufficiency: Secondary | ICD-10-CM

## 2020-10-22 LAB — BASIC METABOLIC PANEL
Anion gap: 8 (ref 5–15)
BUN: 28 mg/dL — ABNORMAL HIGH (ref 8–23)
CO2: 19 mmol/L — ABNORMAL LOW (ref 22–32)
Calcium: 8.9 mg/dL (ref 8.9–10.3)
Chloride: 118 mmol/L — ABNORMAL HIGH (ref 98–111)
Creatinine, Ser: 2.82 mg/dL — ABNORMAL HIGH (ref 0.44–1.00)
GFR, Estimated: 17 mL/min — ABNORMAL LOW (ref >60)
Glucose, Bld: 152 mg/dL — ABNORMAL HIGH (ref 70–99)
Potassium: 4.1 mmol/L (ref 3.5–5.1)
Sodium: 145 mmol/L (ref 135–145)

## 2020-10-22 LAB — ECHOCARDIOGRAM COMPLETE
Area-P 1/2: 3.48 cm2
Calc EF: 72.9 %
Height: 67 in
S' Lateral: 2.7 cm
Single Plane A2C EF: 72.7 %
Single Plane A4C EF: 73.3 %
Weight: 2402.13 oz

## 2020-10-22 LAB — GLUCOSE, CAPILLARY
Glucose-Capillary: 88 mg/dL (ref 70–99)
Glucose-Capillary: 225 mg/dL — ABNORMAL HIGH (ref 70–99)
Glucose-Capillary: 123 mg/dL — ABNORMAL HIGH (ref 70–99)
Glucose-Capillary: 179 mg/dL — ABNORMAL HIGH (ref 70–99)

## 2020-10-22 LAB — CBC WITH DIFFERENTIAL/PLATELET
Abs Immature Granulocytes: 0.03 10*3/uL (ref 0.00–0.07)
Basophils Absolute: 0 10*3/uL (ref 0.0–0.1)
Basophils Relative: 1 %
Eosinophils Absolute: 0.2 10*3/uL (ref 0.0–0.5)
Eosinophils Relative: 4 %
HCT: 28.6 % — ABNORMAL LOW (ref 36.0–46.0)
Hemoglobin: 8.8 g/dL — ABNORMAL LOW (ref 12.0–15.0)
Immature Granulocytes: 1 %
Lymphocytes Relative: 20 %
Lymphs Abs: 0.9 10*3/uL (ref 0.7–4.0)
MCH: 28.6 pg (ref 26.0–34.0)
MCHC: 30.8 g/dL (ref 30.0–36.0)
MCV: 92.9 fL (ref 80.0–100.0)
Monocytes Absolute: 0.3 10*3/uL (ref 0.1–1.0)
Monocytes Relative: 8 %
Neutro Abs: 3 10*3/uL (ref 1.7–7.7)
Neutrophils Relative %: 66 %
Platelets: 153 10*3/uL (ref 150–400)
RBC: 3.08 MIL/uL — ABNORMAL LOW (ref 3.87–5.11)
RDW: 13.7 % (ref 11.5–15.5)
WBC: 4.4 10*3/uL (ref 4.0–10.5)
nRBC: 0 % (ref 0.0–0.2)

## 2020-10-22 MED ORDER — PANTOPRAZOLE SODIUM 40 MG PO TBEC
40.0000 mg | DELAYED_RELEASE_TABLET | Freq: Two times a day (BID) | ORAL | Status: DC
  Administered 2020-10-22 – 2020-10-28 (×13): 40 mg via ORAL
  Filled 2020-10-22 (×13): qty 1

## 2020-10-22 MED ORDER — CLOPIDOGREL BISULFATE 75 MG PO TABS
75.0000 mg | ORAL_TABLET | Freq: Every day | ORAL | Status: DC
  Administered 2020-10-22 – 2020-10-25 (×4): 75 mg via ORAL
  Filled 2020-10-22 (×4): qty 1

## 2020-10-22 NOTE — Progress Notes (Signed)
  Echocardiogram 2D Echocardiogram has been performed.  Bobbye Charleston 10/22/2020, 1:54 PM

## 2020-10-22 NOTE — Consult Note (Addendum)
Stroke Neurology Consultation Note  Consult Requested by: Dr Pietro Cassis [facility]  Reason for Consult: stroke   Consult Date:  10/22/20  The history was obtained from the patient  During history and examination, all items I was personally able to obtain unless otherwise noted.  History of Present Illness:  Desiree Pollard is an 74 y.o. African American female with PMH of HTN, CKD, CHB s/p pacer, chronic anemia, breast cancer in remission, who had a stroke 1 yr ago presented 10/19/20 with dizziness for 2 days on 10/19/2020. Dizziness was described as a feeling of imbalance present in sitting or lying. She denied vertigo, nausea, vomiting, double vision, blurred vision or increase extremity weakness or numbness. She has residual left leg weakness and difficulty with balance falling. Stroke in June 2021. She had admission CT scan done in the ER which showed chronic changes of old strokes with right frontal encephalomalacia, bilateral basal ganglia and cerebellar lacunar infarcts. MRI of the brain done yesterday and personally reviewed shows tiny punctate right thalamic lacunar acute infarct and multiple remote age lacunar infarcts involving bilateral basal ganglia and cerebellum and generalized atrophy. Carotid ultrasound and echocardiogram pending. Patient is supposed to be on aspirin but she admits she was not taking it regularly. She lives at home with her family and is able to ambulate with assistance. She states her sugars are well controlled though random sugar on admission was 207 and hemoglobin A1c is not yet back. She states her dizziness is improved today and she is able to sit and move.   Past Medical History:  Diagnosis Date  . Breast cancer ([place])    2017  . CVA (cerebral vascular accident) ([place]) 11/2018  . Dehydration 07/2020  . Diabetes mellitus without complication ([place])   . Diabetic retinopathy ([place])    PDR OU  . Frequent diarrhea 07/2020  . Hyperkalemia 07/2020  . Hypertensive retinopathy     OU  . Stroke [facility])      Past Surgical History:  Procedure Laterality Date  . CATARACT EXTRACTION Right   . MASTECTOMY Bilateral    "2017" Left Mastectomy; "2019" Right Mastectomy  . PACEMAKER IMPLANT  2017    Family History  Problem Relation Age of Onset  . Ovarian cancer Mother   . Heart disease Father   . Diabetes Sister   . Diabetes Brother   . Colon cancer Neg Hx   . Esophageal cancer Neg Hx   . Pancreatic cancer Neg Hx   . Stomach cancer Neg Hx   . Liver disease Neg Hx      Social History:  reports that she has quit smoking. She has never used smokeless tobacco. She reports previous alcohol use. She reports that she does not use drugs.  Review of Systems: A full ROS was attempted today and was able to be performed.  Systems assessed include - Constitutional, Eyes, HENT, Respiratory, Cardiovascular, Gastrointestinal, Genitourinary, Integument/breast, Hematologic/lymphatic, Musculoskeletal, Neurological, Behavioral/Psych, Endocrine, Allergic/Immunologic - with pertinent responses as per HPI. Review of systems is positive for dizziness, gait imbalance only and all other systems negative Allergies:  Allergies  Allergen Reactions  . Amlodipine Nausea And Vomiting    Pt refuses to take due to severe n/v that began after starting amlodipine and stopped after discontinuation.     Medications: I have reviewed the patient's current medications.  Test Results: CBC:  Recent Labs  Lab 10/21/20 0125 10/22/20 0918  WBC 5.1 4.4  NEUTROABS 3.2 3.0  HGB 7.6* 8.8*  HCT 24.9* 28.6*  MCV 93.3 92.9  PLT 154 626   Basic Metabolic Panel:  Recent Labs  Lab 10/20/20 0439 10/21/20 0125  NA 145 144  K 3.1* 3.7  CL 118* 120*  CO2 20* 19*  GLUCOSE 81 133*  BUN 31* 30*  CREATININE 2.97* 2.88*  CALCIUM 8.1* 8.2*  MG 1.9 1.8  PHOS 3.6 3.2   CBG:  Recent Labs  Lab 10/21/20 0909 10/21/20 1153 10/21/20 1616 10/21/20 2118 10/22/20 0623  GLUCAP 131* 170* 106* 144* 88    Microbiology:  Results for orders placed or performed during the hospital encounter of 10/18/20  Respiratory Panel by RT PCR (Flu A&B, Covid) - Nasopharyngeal Swab     Status: None   Collection Time: 10/19/20  5:40 AM   Specimen: Nasopharyngeal Swab  Result Value Ref Range Status   SARS Coronavirus 2 by RT PCR NEGATIVE NEGATIVE Final    Comment: (NOTE) SARS-CoV-2 target nucleic acids are NOT DETECTED.  The SARS-CoV-2 RNA is generally detectable in upper respiratoy specimens during the acute phase of infection. The lowest concentration of SARS-CoV-2 viral copies this assay can detect is 131 copies/mL. A negative result does not preclude SARS-Cov-2 infection and should not be used as the sole basis for treatment or other patient management decisions. A negative result may occur with  improper specimen collection/handling, submission of specimen other than nasopharyngeal swab, presence of viral mutation(s) within the areas targeted by this assay, and inadequate number of viral copies (<131 copies/mL). A negative result must be combined with clinical observations, patient history, and epidemiological information. The expected result is Negative.  Fact Sheet for Patients:  PinkCheek.be  Fact Sheet for Healthcare Providers:  GravelBags.it  This test is no t yet approved or cleared by the [place] FDA and  has been authorized for detection and/or diagnosis of SARS-CoV-2 by FDA under an Emergency Use Authorization (EUA). This EUA will remain  in effect (meaning this test can be used) for the duration of the COVID-19 declaration under Section 564(b)(1) of the Act, 21 U.S.C. section 360bbb-3(b)(1), unless the authorization is terminated or revoked sooner.     Influenza A by PCR NEGATIVE NEGATIVE Final   Influenza B by PCR NEGATIVE NEGATIVE Final    Comment: (NOTE) The Xpert Xpress SARS-CoV-2/FLU/RSV assay is intended as  an aid in  the diagnosis of influenza from Nasopharyngeal swab specimens and  should not be used as a sole basis for treatment. Nasal washings and  aspirates are unacceptable for Xpert Xpress SARS-CoV-2/FLU/RSV  testing.  Fact Sheet for Patients: PinkCheek.be  Fact Sheet for Healthcare Providers: GravelBags.it  This test is not yet approved or cleared by the [place] FDA and  has been authorized for detection and/or diagnosis of SARS-CoV-2 by  FDA under an Emergency Use Authorization (EUA). This EUA will remain  in effect (meaning this test can be used) for the duration of the  Covid-19 declaration under Section 564(b)(1) of the Act, 21  U.S.C. section 360bbb-3(b)(1), unless the authorization is  terminated or revoked. Performed at [facility], [place] [address], [place] [address]    Lipid Panel:     Component Value Date/Time   CHOL 137 06/10/2020 1433   TRIG 110 06/10/2020 1433   HDL 47 06/10/2020 1433   CHOLHDL 2.9 06/10/2020 1433   LDLCALC 70 06/10/2020 1433   HgbA1c:  Lab Results  Component Value Date   HGBA1C 6.2 (A) 06/10/2020   HGBA1C 6.2 06/10/2020   HGBA1C 6.2 06/10/2020  HGBA1C 6.2 06/10/2020   Urine Drug Screen: No results found for: LABOPIA, COCAINSCRNUR, LABBENZ, AMPHETMU, THCU, LABBARB  Alcohol Level: No results for input(s): ETH in the last 168 hours.  CT HEAD WO CONTRAST  Result Date: 10/18/2020 CLINICAL DATA:  Several days of dizziness, recent antihypertensive medication change EXAM: CT HEAD WITHOUT CONTRAST TECHNIQUE: Contiguous axial images were obtained from the base of the skull through the vertex without intravenous contrast. COMPARISON:  CT 12/26/2019 FINDINGS: Brain: Stable region of encephalomalacia along the subcortical white matter adjacent the anterior horn of the right lateral ventricle. Additional bilateral lacunar type infarcts and encephalomalacia seen in the  bilateral cerebellar hemispheres including some expected progressive encephalomalacia along the right inferior cerebellum corresponding with a presumed subacute infarct on comparison CT. No new CT evident areas of large vascular territory infarct or cortically based infarct are seen. No evidence of acute hemorrhage, hydrocephalus, extra-axial collection, visible mass lesion or mass effect. Patchy areas of white matter hypoattenuation are most compatible with chronic microvascular angiopathy. Symmetric prominence of the ventricles, cisterns and sulci compatible with parenchymal volume loss. Vascular: Atherosclerotic calcification of the carotid siphons and intradural vertebral arteries. No hyperdense vessel. Skull: No calvarial fracture or suspicious osseous lesion. No scalp swelling or hematoma. Sinuses/Orbits: Paranasal sinuses and mastoid air cells are predominantly clear. Orbital structures are unremarkable aside from prior lens extractions. Other: Debris in the right external auditory canal. Bilateral TMJ arthrosis. IMPRESSION: 1. No acute intracranial abnormality. 2. Stable appearance of right frontal encephalomalacia and bilateral lacunar type infarcts in the basal ganglia and cerebellar hemispheres. Expected evolution of encephalomalacia along the right inferior cerebellum corresponding with a presumed subacute infarct on comparison CT. 3. Chronic microvascular angiopathy and parenchymal volume loss. 4. Debris in the right external auditory canal, correlate for cerumen impaction. Electronically Signed   By: Lovena Le M.D.   On: 10/18/2020 23:54   MR BRAIN WO CONTRAST  Result Date: 10/21/2020 CLINICAL DATA:  Neuro deficit, acute stroke suspected. EXAM: MRI HEAD WITHOUT CONTRAST TECHNIQUE: Multiplanar, multiecho pulse sequences of the brain and surrounding structures were obtained without intravenous contrast. COMPARISON:  CT head October 18, 2020. FINDINGS: Brain: Small acute infarct in the right  thalamus. Mild associated edema without mass effect. No acute hemorrhage. Remote infarcts in bilateral cerebellar hemispheres, bilateral basal ganglia, and right frontal white matter. There is ex vacuo dilation of the fourth ventricle and lateral ventricles. Additional scattered T2/FLAIR hyperintensities within the white matter are likely the sequela of chronic microvascular ischemic disease. Vascular: Major arterial flow voids are maintained at the skull base. Skull and upper cervical spine: Normal marrow signal. Sinuses/Orbits: Atretic left maxillary sinus with mild scattered paranasal sinus mucosal thickening. Unremarkable orbits. Other: No mastoid effusions. IMPRESSION: 1. Small acute infarct in the right thalamus. 2. Multiple remote lacunar infarcts, chronic microvascular ischemic disease, and generalized cerebral volume loss. Electronically Signed   By: Margaretha Sheffield MD   On: 10/21/2020 14:32   DG Chest Port 1 View  Result Date: 10/18/2020 CLINICAL DATA:  Dizziness EXAM: PORTABLE CHEST 1 VIEW COMPARISON:  None. FINDINGS: The heart size and mediastinal contours are borderline enlarged. Aortic knob calcifications are seen. A right-sided pacemaker seen with the lead tips in the right atrium right ventricle. Both lungs are clear. The visualized skeletal structures are unremarkable. Surgical clips seen within the left axilla. IMPRESSION: No active disease. Electronically Signed   By: Prudencio Pair M.D.   On: 10/18/2020 23:40   Intravitreal Injection, Pharmacologic Agent - OD - Right  Eye  Result Date: 10/03/2020 Time Out 10/03/2020. 10:24 AM. Confirmed correct patient, procedure, site, and patient consented. Anesthesia Topical anesthesia was used. Anesthetic medications included Lidocaine 2%, Proparacaine 0.5%. Procedure Preparation included 5% betadine to ocular surface, eyelid speculum. A (32g) needle was used. Injection: 2 mg aflibercept Alfonse Flavors) SOLN   NDC: A3590391, Lot: [id number], Expiration  date: 12/14/2020   Route: Intravitreal, Site: Right Eye, Waste: 0.05 mL Post-op Post injection exam found visual acuity of at least counting fingers. The patient tolerated the procedure well. There were no complications. The patient received written and verbal post procedure care education. Post injection medications were not given. Notes An AC tap was performed following injection due to elevated IOP using a 30 gauge needle on a syringe with the plunger removed. The needle was placed at the limbus at 7 oclock and approximately 0.08cc of aqueous was removed from the anterior chamber. Betadine was applied to the tap area before and after the paracentesis was performed. There were no complications. The patient tolerated the procedure well. The IOP was rechecked and was found to be ~8 mmHg by palpation.   Intravitreal Injection, Pharmacologic Agent - OS - Left Eye  Result Date: 10/03/2020 Time Out 10/03/2020. 10:25 AM. Confirmed correct patient, procedure, site, and patient consented. Anesthesia Topical anesthesia was used. Anesthetic medications included Lidocaine 2%, Proparacaine 0.5%. Procedure Preparation included 5% betadine to ocular surface, eyelid speculum. A (32g) needle was used. Injection: 2 mg aflibercept Alfonse Flavors) SOLN   NDC: A3590391, Lot: [id number], Expiration date: 08/14/2021   Route: Intravitreal, Site: Left Eye, Waste: 0.05 mL Post-op Post injection exam found visual acuity of at least counting fingers. The patient tolerated the procedure well. There were no complications. The patient received written and verbal post procedure care education. Post injection medications were not given.   OCT, Retina - OU - Both Eyes  Result Date: 10/03/2020 Right Eye Quality was good. Central Foveal Thickness: 485. Progression has worsened. Findings include abnormal foveal contour, intraretinal fluid, intraretinal hyper-reflective material, no SRF, outer retinal atrophy, epiretinal membrane, macular pucker  (Interval increase in central IRF). Left Eye Quality was good. Central Foveal Thickness: 855. Progression has improved. Findings include abnormal foveal contour, intraretinal fluid, epiretinal membrane, no SRF, preretinal fibrosis, vitreous traction (Interval improvement in subhyaloid heme and vitreous opacities, persistent traction). Notes *Images captured and stored on drive Diagnosis / Impression: DME OU Mild ERM OU OD: Interval increase in central IRF OS: Interval improvement in subhyaloid heme and vitreous opacities, persistent traction Clinical management: See below Abbreviations: NFP - Normal foveal profile. CME - cystoid macular edema. PED - pigment epithelial detachment. IRF - intraretinal fluid. SRF - subretinal fluid. EZ - ellipsoid zone. ERM - epiretinal membrane. ORA - outer retinal atrophy. ORT - outer retinal tubulation. SRHM - subretinal hyper-reflective material     EKG: normal EKG, normal sinus rhythm.   Physical Examination: Temp:  [97.3 F (36.3 C)-98.2 F (36.8 C)] 98.2 F (36.8 C) (11/09 0750) Pulse Rate:  [59-64] 59 (11/09 0750) Resp:  [18-20] 18 (11/09 0750) BP: (122-161)/(58-82) 161/69 (11/09 0750) SpO2:  [100 %] 100 % (11/09 0750)  Pleasant elderly African-American lady sitting comfortably in bedside chair. Not in distress. . Afebrile. Head is nontraumatic. Neck is supple without bruit.    Cardiac exam no murmur or gallop. Lungs are clear to auscultation. Distal pulses are well felt. Neurological Exam :  She is awake alert oriented to place and person only. Diminished attention, registration and recall. Speech appears clear without dysarthria  or aphasia. Follows commands well. Extraocular movements are full range without nystagmus and she blinks to threat bilaterally. Mild left lower facial asymmetry. Tongue midline motor system exam no upper or lower extremity drift but weakness of left grip and intrinsic hand muscles and orbits right over left upper extremity. Trace  weakness of left ankle dorsiflexors. Tone is slightly increased on the left compared to the right. Deep tendon reflexes are brisk. No clonus. Jaw jerk is brisk. Sensation appears preserved bilaterally. Gait not tested. Assessment:  Desiree Pollard is a 74 y.o. female with history of HTN, CKD, CHB s/p pacer, chronic anemia, breast cancer in remission, who had a stroke 1 yr ago presented 10/19/20 with dizziness. MRI positive for infarct.  Stroke:   Small R thalamic infarct secondary to small vessel disease in a patient with multiple previous lacunar infarcts.  Code Stroke CT head No acute stroke. Stable R frontal and B basal ganglia and cerebellar lacunes. Presume R interior cerebellar subacute infarct. Small vessel disease. Atrophy. R ear cerumen impaction.  MRI  Small R thalamic infarct. Multiple old lacunes, small vessel disease. Atrophy.  TCD  pending   Carotid Doppler  pending   2D Echo pending   LDL pending   HgbA1c pending    SCDs for VTE prophylaxis  aspirin 81 mg daily prior to admission, now on aspirin 81 mg daily. Add plavix. Continue DAPT x 3 weeks then plavix alone  Therapy recommendations:  SNF  Disposition:  pending   Hypertension  Stable . BP goal normotensive  Hyperlipidemia  Home meds:  lipitor 80, resumed in hospital  LDL pending, goal < 70  Continue statin at discharge  Diabetes type II, Controlled  HgbA1c pending goal < 7.0  CBGs  SSI  Other Stroke Risk Factors  Advanced age  Hx ETOH use, advised to drink no more than 1 drink(s) a day  Hx stroke/TIA  12/2018 - right corpus striatum, right inferior cerebellum, and left cerebellum - [facility]  Other Active Problems  CHB has pacer 2017  Hx breast cancer s/p mastectomy  Hospital day # 3  She presented with dizziness which is nonspecific without any lateralizing signs but MRI scan of the brain shows tiny acute punctate right thalamic lacunar infarct as well as bilateral old basal ganglia and  cerebellar lacune's as well. Recommend dual antiplatelet therapy of aspirin Plavix for 3 weeks followed by Plavix alone and aggressive risk factor modification. Check lipid profile, hemoglobin A1c, carotid ultrasound and transcranial Doppler studies. Therapy consults. Patient counseled to be compliant with her medications.  Greater than 50% time during this 80-minute consultation visit was spent on counseling and coordination of care about her lacunar stroke and discussion about stroke prevention and treatment and answering questions.  Discussed with Dr. Pietro Cassis  Thank you for this consultation and allowing [place] to participate in the care of this patient.  Antony Contras, MD  To contact Stroke Continuity provider, please refer to [email or web address] After hours, contact General Neurology

## 2020-10-22 NOTE — TOC Progression Note (Signed)
Transition of Care [facility]) - Progression Note    Patient Details  Name: Anniston Nellums MRN: [id number] Date of Birth: Jul 15, 1946  [facility]) CM/SW Contact  Pollie Friar, RN Phone Number: 10/22/2020, 8:35 AM  Clinical Narrative:    CM asked to meet with the patient and family yesterday after seen by CSW. Sister states they want to take the patient home when medically ready. She states they have been caring for her at home and she feels they can continue. She requested [facility] for [facility].  TOC following.   Expected Discharge Plan: [place] Barriers to Discharge: Continued Medical Work up, SNF Pending bed offer  Expected Discharge Plan and Services Expected Discharge Plan: [place] arrangements for the past 2 months: Single Family Home                                       Social Determinants of Health (SDOH) Interventions    Readmission Risk Interventions No flowsheet data found.

## 2020-10-22 NOTE — Progress Notes (Signed)
PROGRESS NOTE  Desiree Pollard  DOB: 18-May-1946  PCP: Patient, No Pcp Per [facility]:[id number]  DOA: 10/18/2020  LOS: 3 days   Chief Complaint  Patient presents with  . Dizziness   Brief narrative: Desiree Pollard is a 74 y.o. 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 Patient presented to the ED on 11/5 with complaint of persistent dizziness for few days.  Apparently, patient's blood pressure was being monitored and meds adjusted by her PCP as an outpatient. One of her blood pressure medications Procardia was doubled from 30 mg to 60 mg recently.  Patient was experiencing dizziness especially on getting up. On the day of admission, family noted that she was drooling and did not notice.    CT scan of head did not show any acute abnormality but showed stable right frontal encephalomalacia and bilateral lacunar type infarcts in the basal ganglia and cerebellar hemispheres. Labs showed creatinine elevated to 3.31, hemoglobin 8.9, platelet 119  Patient was admitted for further evaluation. She had MRI brain ordered on admission but it got delayed to be done because of the weekend schedule and also because he had a pacemaker in place which had to be verified for compatibility. 11/8, MRI brain showed a small acute infarct in the right thalamus and multiple remote lacunar infarcts as well as chronic microvascular ischemic disease.  Subjective: Patient was seen and examined this morning. Lying in bed.  Not in distress.  Has mild chronic left sided weakness.  Assessment/Plan: Acute infarct of the right thalamus Multiple chronic remote lacunar infarcts -Presented with persistent symptoms of dizziness especially on getting up for 2 to 3 days in a background of previous history of right cerebellar area stroke -MRI finding as above showing  an acute right thalamic infarct. She was also suspected to have orthostatic hypotension with new adjustment of blood pressure medications -Stroke work-up ordered. -Prior to admission, patient was on aspirin and statin. Unclear compliance -Pending neurology recommendation on antiplatelets. -PT eval obtained. SNF recommended.  AKI on CKD 4  -AKI likely secondary to hypotension related to meds.   -Creatinine at baseline seems to be less than 2.7. Presented with an elevated creatinine of 3.31.  Improved with IV fluid. Encourage oral hydration. Recent Labs    06/10/20 1433 08/07/20 1559 10/19/20 0001 10/19/20 0644 10/20/20 0439 10/21/20 0125 10/22/20 0918  BUN 27 27 35* 36* 31* 30* 28*  CREATININE 2.66* 2.56* 3.31* 3.35* 2.97* 2.88* 2.82*   Essential hypertension -Prior to admission, patient was not Coreg, lisinopril and Procardia, reportedly Procardia dose was recently increased after which patient started getting dizzy.   -Initially blood pressure meds were held but with rising blood pressure, she is back on her Coreg and Procardia at home dose. Lisinopril remains on hold because of AKI.  Diabetes mellitus type 2 -Last A1c 6.2 on 6/28 -Takes Lantus 4 units daily at home. -Currently blood sugar is controlled on same with sliding scale insulin with Accu-Cheks. Recent Labs  Lab 10/21/20 1153 10/21/20 1616 10/21/20 2118 10/22/20 0623 10/22/20 1216  GLUCAP 170* 106* 144* 88 225*   Acute on chronic anemia -Chronic low hemoglobin on iron supplement. -Currently hemoglobin is stable between 7-8.  No active bleeding noted.  Black stool probably secondary to iron supplement.  Continue to monitor. Recent Labs    06/10/20 1433 08/07/20 1559  10/19/20 0001 10/19/20 0001 10/19/20 0644 10/19/20 0644 10/20/20 0439 10/20/20 0439 10/21/20 0125 10/22/20 0918  HGB 9.7*   < > 8.9*  --  7.8*  --  7.7*  --  7.6* 8.8*  MCV 85   < > 93.6   < > 93.5   < > 92.5   < > 93.3 92.9  VITAMINB12 405   --   --   --   --   --   --   --   --   --    < > = values in this interval not displayed.   Hypokalemia -Improved with replacement. Recent Labs  Lab 10/19/20 0001 10/19/20 0644 10/20/20 0439 10/21/20 0125 10/22/20 0918  K 4.0 3.3* 3.1* 3.7 4.1  MG  --   --  1.9 1.8  --   PHOS  --   --  3.6 3.2  --    Chronic thrombocytopenia -No active bleeding. Recent Labs  Lab 10/19/20 0001 10/19/20 0644 10/20/20 0439 10/21/20 0125 10/22/20 0918  PLT 119* 149* 151 154 153   History of complete heart block status post pacemaker placement.  History of breast cancer in remission.   Mobility: PT eval appreciated. SNF recommended.  Code Status:   Code Status: Full Code  Nutritional status: Body mass index is 23.51 kg/m.     Diet Order            Diet heart healthy/carb modified Room service appropriate? Yes; Fluid consistency: Thin  Diet effective now                 DVT prophylaxis: heparin injection 5,000 Units Start: 10/19/20 1400   Antimicrobials:  None Fluid: Not on IV fluid currently Consultants: Neurology Family Communication:  None at bedside  Status is: Inpatient  Remains inpatient appropriate because:Unsafe d/c plan  Dispo: The patient is from: Home              Anticipated d/c is to: SNF              Anticipated d/c date is: Pending stroke work-up and SNF availability              Patient currently is not medically stable to d/c.   Infusions:    Scheduled Meds: . aspirin EC  81 mg Oral Daily  . atorvastatin  80 mg Oral Daily  . carvedilol  25 mg Oral BID WC  . donepezil  10 mg Oral QHS  . dorzolamide-timolol  1 drop Both Eyes BID  . ferrous sulfate  325 mg Oral Q breakfast  . heparin  5,000 Units Subcutaneous Q8H  . insulin aspart  0-6 Units Subcutaneous TID WC  . insulin glargine  4 Units Subcutaneous Daily  . latanoprost  1 drop Right Eye QHS  . NIFEdipine  60 mg Oral Daily  . pantoprazole (PROTONIX) IV  40 mg Intravenous Q12H     Antimicrobials: Anti-infectives (From admission, onward)   None      PRN meds: acetaminophen **OR** acetaminophen, hydrALAZINE   Objective: Vitals:   10/22/20 0750 10/22/20 1211  BP: (!) 161/69 (!) 154/63  Pulse: (!) 59 (!) 57  Resp: 18 20  Temp: 98.2 F (36.8 C) (!) 97.3 F (36.3 C)  SpO2: 100% 100%    Intake/Output Summary (Last 24 hours) at 10/22/2020 1402 Last data filed at 10/22/2020 2956 Gross per 24 hour  Intake 354 ml  Output 700 ml  Net -346 ml   [employer]  10/18/20 2325 10/19/20 1106  Weight: 70.3 kg 68.1 kg   Weight change:  Body mass index is 23.51 kg/m.   Physical Exam: General exam: Appears calm and comfortable. Not in physical distress Skin: No rashes, lesions or ulcers. HEENT: Atraumatic, normocephalic, supple neck, no obvious bleeding Lungs: Clear to auscultation bilaterally CVS: Regular rate and rhythm, no murmur GI/Abd soft, nontender, nondistended, bowel sound present CNS: Alert, awake, oriented to place mild left-sided weakness from previous stroke Psychiatry: Mood appropriate Extremities: Trace bilateral pedal edema, no calf tenderness  Data Review: I have personally reviewed the laboratory data and studies available.  Recent Labs  Lab 10/19/20 0001 10/19/20 0644 10/20/20 0439 10/21/20 0125 10/22/20 0918  WBC 6.5 6.1 4.8 5.1 4.4  NEUTROABS 4.9 4.3 2.9 3.2 3.0  HGB 8.9* 7.8* 7.7* 7.6* 8.8*  HCT 29.1* 25.8* 24.5* 24.9* 28.6*  MCV 93.6 93.5 92.5 93.3 92.9  PLT 119* 149* 151 154 153   Recent Labs  Lab 10/19/20 0001 10/19/20 0644 10/20/20 0439 10/21/20 0125 10/22/20 0918  NA 143 144 145 144 145  K 4.0 3.3* 3.1* 3.7 4.1  CL 113* 116* 118* 120* 118*  CO2 19* 19* 20* 19* 19*  GLUCOSE 207* 135* 81 133* 152*  BUN 35* 36* 31* 30* 28*  CREATININE 3.31* 3.35* 2.97* 2.88* 2.82*  CALCIUM 8.6* 8.2* 8.1* 8.2* 8.9  MG  --   --  1.9 1.8  --   PHOS  --   --  3.6 3.2  --     F/u labs ordered  Signed, Terrilee Croak,  MD [facility] 10/22/2020

## 2020-10-22 NOTE — Care Management Important Message (Signed)
Important Message  Patient Details  Name: Desiree Pollard MRN: [id number] Date of Birth: [birth year]   Medicare Important Message Given:  Yes     Shaletha Humble [address] 10/22/2020, 1:40 PM

## 2020-10-22 NOTE — Progress Notes (Signed)
Physical Therapy Treatment Patient Details Name: Desiree Henandez MRN: [id number] DOB: [birth year] Today's Date: 10/22/2020    History of Present Illness Desiree Pollard is a 74 y.o. female with history of chronic kidney disease stage IV baseline creatinine around 2.5, complete heart block status post pacemaker placement, history of breast cancer in remission, diabetes mellitus type 2, hypertension, chronic anemia admitted due to experiencing dizziness.  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  Patient blood work also shows new thrombocytopenia with mild worsening of anemia from usual around 10 to 8.    PT Comments    Pt's sister present for session. Pt able to progress with ambulation however required mod A +2 for safety and maintaining short, ineffective shuffle steps with initial ambulation. During second bout of 5' ambulation worked on lengthening step but this fatigued pt very quickly. Pt would benefit from quantity of therapy she will receive at SNF as well as safety concerns with her going straight home with family. Pt agreeable to SNF for rehab, sister voiced agreement as well. PT will continue to follow.    Follow Up Recommendations  SNF;Supervision/Assistance - 24 hour     Equipment Recommendations  None recommended by PT    Recommendations for Other Services       Precautions / Restrictions Precautions Precautions: Fall Restrictions Weight Bearing Restrictions: No    Mobility  Bed Mobility Overal bed mobility: Needs Assistance Bed Mobility: Sit to Supine       Sit to supine: Mod assist   General bed mobility comments: pt received in chair  Transfers Overall transfer level: Needs assistance Equipment used: Rolling walker (2 wheeled) Transfers: Sit to/from [facility] Sit to Stand: Min assist;Mod assist;+2 physical assistance Stand pivot transfers: Max assist       General transfer  comment: pt required up to MOD A +2 to power into standing from recliner. good hand placement noted however pt with most difficulty shifting weight anteriorly to power into standing. pt did require MAX A to complete stand pivot transfer from EOB>recliner without AD.   Ambulation/Gait Ambulation/Gait assistance: +2 safety/equipment;Mod assist Gait Distance (Feet): 5 Feet (2x) Assistive device: Rolling walker (2 wheeled) Gait Pattern/deviations: Shuffle;Decreased stride length;Trunk flexed Gait velocity: decreased Gait velocity interpretation: <1.31 ft/sec, indicative of household ambulator General Gait Details: pt taking very short, ineffective shuffling steps with trunk flexed during first 5' ambulation. Worked on lengthening step second bout of ambulation and pt was able to do this for a few steps but fatigues very quickly. Pt unable to safely ambulate bkwd from sink to recliner    Stairs             Wheelchair Mobility    Modified Rankin (Stroke Patients Only) Modified Rankin (Stroke Patients Only) Pre-Morbid Rankin Score: Moderate disability Modified Rankin: Moderately severe disability     Balance Overall balance assessment: Needs assistance Sitting-balance support: Single extremity supported;Feet supported Sitting balance-Leahy Scale: Fair     Standing balance support: No upper extremity supported;During functional activity Standing balance-Leahy Scale: Fair Standing balance comment: pt able to complete dynamic tasks at sink with min guard for balance                            Cognition Arousal/Alertness: Awake/alert Behavior During Therapy: WFL for tasks assessed/performed Overall Cognitive Status: Impaired/Different from baseline Area of Impairment: Attention;Safety/judgement;Problem solving  Current Attention Level: Sustained     Safety/Judgement: Decreased awareness of safety;Decreased awareness of deficits   Problem  Solving: Slow processing;Decreased initiation;Difficulty sequencing General Comments: noted slowed responses but able to follow commands      Exercises      General Comments General comments (skin integrity, edema, etc.): HR low 60's after ambulation. SpO2 in 90's on RA.       Pertinent Vitals/Pain Pain Assessment: No/denies pain    Home Living                      Prior Function            PT Goals (current goals can now be found in the care plan section) Acute Rehab PT Goals Patient Stated Goal: to get more indep.  PT Goal Formulation: With patient Time For Goal Achievement: 11/03/20 Potential to Achieve Goals: Good Progress towards PT goals: Progressing toward goals    Frequency    Min 3X/week      PT Plan Current plan remains appropriate    Co-evaluation PT/OT/SLP Co-Evaluation/Treatment: Yes Reason for Co-Treatment: Complexity of the patient's impairments (multi-system involvement);Necessary to address cognition/behavior during functional activity;For patient/therapist safety PT goals addressed during session: Mobility/safety with mobility;Balance;Proper use of DME;Strengthening/ROM        AM-PAC PT "6 Clicks" Mobility   Outcome Measure  Help needed turning from your back to your side while in a flat bed without using bedrails?: A Little Help needed moving from lying on your back to sitting on the side of a flat bed without using bedrails?: A Lot Help needed moving to and from a bed to a chair (including a wheelchair)?: A Lot Help needed standing up from a chair using your arms (e.g., wheelchair or bedside chair)?: A Lot Help needed to walk in hospital room?: A Lot Help needed climbing 3-5 steps with a railing? : Total 6 Click Score: 12    End of Session Equipment Utilized During Treatment: Gait belt Activity Tolerance: Patient limited by fatigue Patient left: with call bell/phone within reach;in bed;Other (comment) (tech present for  ECHO) Nurse Communication: Mobility status PT Visit Diagnosis: Other abnormalities of gait and mobility (R26.89);Muscle weakness (generalized) (M62.81);Dizziness and giddiness (R42)     Time: 2202-5427 PT Time Calculation (min) (ACUTE ONLY): 25 min  Charges:  $Gait Training: 8-22 mins                     Leighton Roach, [place]  Pager [phone] Office [place] 10/22/2020, 2:25 PM

## 2020-10-22 NOTE — CV Procedure (Signed)
2D echo attempted, patient in chair. Will try later 

## 2020-10-22 NOTE — Progress Notes (Signed)
Occupational Therapy Treatment Patient Details Name: Desiree Pollard MRN: [id number] DOB: November 24, 1946 Today's Date: 10/22/2020    History of present illness Desiree Pollard is a 74 y.o. female with history of chronic kidney disease stage IV baseline creatinine around 2.5, complete heart block status post pacemaker placement, history of breast cancer in remission, diabetes mellitus type 2, hypertension, chronic anemia admitted due to experiencing dizziness.  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  Patient blood work also shows new thrombocytopenia with mild worsening of anemia from usual around 10 to 8.   OT comments  Pt seen in conjunction with PT to optimize pts activity tolerance and assess pts functional mobility progression as family interested in taking pt home, however pt currently requiring up to MAX A for pivot transfers, and up to [facility] for sit<>stands with RW. Pt continues to present with impaired balance, decreased activity tolerance and generalized weakness impacting pts ability to complete BADLs. Pt currently requires min guard for standing UB ADLs at sink and supervision for LB ADLs from recliner. Pt and family are now agreeable to short term SNF placement. Will continue to follow acutely per POC.   Follow Up Recommendations  SNF;Supervision/Assistance - 24 hour    Equipment Recommendations  Tub/shower bench    Recommendations for Other Services      Precautions / Restrictions Precautions Precautions: Fall Restrictions Weight Bearing Restrictions: No       Mobility Bed Mobility Overal bed mobility: Needs Assistance Bed Mobility: Sit to Supine       Sit to supine: Mod assist   General bed mobility comments: pt required MOD A to return to supine needing most assist to elevate BLEs back to bed and lower trunk  Transfers Overall transfer level: Needs assistance Equipment used: Rolling walker (2  wheeled) Transfers: Sit to/from [facility] Sit to Stand: Min assist;Mod assist;+2 physical assistance Stand pivot transfers: Max assist       General transfer comment: pt required up to MOD A to power into standing from recliner. good hand placement noted however pt with most difficulty shifting weight anteriorly to power into standing. pt did require MAX A to complete stand pivot transfer from EOB>recliner.    Balance Overall balance assessment: Needs assistance Sitting-balance support: Single extremity supported;Feet supported Sitting balance-Leahy Scale: Fair     Standing balance support: No upper extremity supported;During functional activity Standing balance-Leahy Scale: Fair Standing balance comment: pt able to complete dynamic tasks at sink with min guard for balance                           ADL either performed or assessed with clinical judgement   ADL Overall ADL's : Needs assistance/impaired     Grooming: Wash/dry hands;Standing;Min guard Grooming Details (indicate cue type and reason): min guard for standing balance at sink, noted one posterior LOB with pt able to self correct             Lower Body Dressing: Supervision/safety;Sitting/lateral leans Lower Body Dressing Details (indicate cue type and reason): pt able to adjust socks from recliner with supervision Toilet Transfer: Minimal assistance;Moderate assistance;+2 for physical assistance;RW;Ambulation Toilet Transfer Details (indicate cue type and reason): pt required up to MOD A +2 to stand from recliner during simualted toilet transfer. pt requries cues to position BLEs underneath pts hips and requires assist to shift weight anteriorly to power up into standing. pt required  cues to progress gait as pt present with short steps needing cue of "heel to toe" to faciliate functional gait pattern         Functional mobility during ADLs: Minimal assistance;Moderate assistance;+2 for  physical assistance;+2 for safety/equipment;Cueing for sequencing;Rolling walker General ADL Comments: pt presents with impaired balance, decreased activity tolerance and generalized weakness     Vision       Perception     Praxis      Cognition Arousal/Alertness: Awake/alert Behavior During Therapy: WFL for tasks assessed/performed Overall Cognitive Status: Impaired/Different from baseline Area of Impairment: Attention;Safety/judgement;Problem solving                   Current Attention Level: Sustained     Safety/Judgement: Decreased awareness of safety;Decreased awareness of deficits   Problem Solving: Slow processing;Decreased initiation;Difficulty sequencing General Comments: noted slowed responses but able to follow commands        Exercises     Shoulder Instructions       General Comments pt HR remain in lows 60s post mobility with SpO2 WFL, pts sister present during session contributing minimally to session but was agreeable to SNF placement    Pertinent Vitals/ Pain       Pain Assessment: No/denies pain  Home Living                                          Prior Functioning/Environment              Frequency  Min 2X/week        Progress Toward Goals  OT Goals(current goals can now be found in the care plan section)  Progress towards OT goals: Progressing toward goals  Acute Rehab OT Goals Patient Stated Goal: to get more indep.  OT Goal Formulation: With patient Time For Goal Achievement: 11/04/20 Potential to Achieve Goals: Good  Plan Discharge plan remains appropriate;Frequency remains appropriate    Co-evaluation    PT/OT/SLP Co-Evaluation/Treatment: Yes Reason for Co-Treatment: Complexity of the patient's impairments (multi-system involvement);For patient/therapist safety;To address functional/ADL transfers          AM-PAC OT "6 Clicks" Daily Activity     Outcome Measure   Help from another person  eating meals?: None Help from another person taking care of personal grooming?: A Little Help from another person toileting, which includes using toliet, bedpan, or urinal?: A Lot Help from another person bathing (including washing, rinsing, drying)?: A Lot Help from another person to put on and taking off regular upper body clothing?: A Little Help from another person to put on and taking off regular lower body clothing?: A Lot 6 Click Score: 16    End of Session Equipment Utilized During Treatment: Gait belt;Rolling walker  OT Visit Diagnosis: Unsteadiness on feet (R26.81);Muscle weakness (generalized) (M62.81);Low vision, both eyes (H54.2);Other symptoms and signs involving cognitive function   Activity Tolerance Patient tolerated treatment well   Patient Left in bed;with call bell/phone within reach;Other (comment);with family/visitor present (handed off to echo tech)   Nurse Communication Mobility status        Time: 4818-5631 OT Time Calculation (min): 25 min  Charges: OT General Charges $OT Visit: 1 Visit OT Treatments $Self Care/Home Management : 8-22 mins  Lanier Clam., COTA/L Acute Rehabilitation Services [phone]    Ihor Gully 10/22/2020, 1:59 PM

## 2020-10-22 NOTE — Plan of Care (Signed)

## 2020-10-23 ENCOUNTER — Inpatient Hospital Stay (HOSPITAL_COMMUNITY): Payer: Medicare HMO

## 2020-10-23 DIAGNOSIS — I639 Cerebral infarction, unspecified: Secondary | ICD-10-CM | POA: Diagnosis not present

## 2020-10-23 DIAGNOSIS — D696 Thrombocytopenia, unspecified: Secondary | ICD-10-CM

## 2020-10-23 DIAGNOSIS — I10 Essential (primary) hypertension: Secondary | ICD-10-CM | POA: Diagnosis not present

## 2020-10-23 DIAGNOSIS — N179 Acute kidney failure, unspecified: Secondary | ICD-10-CM | POA: Diagnosis not present

## 2020-10-23 DIAGNOSIS — R42 Dizziness and giddiness: Secondary | ICD-10-CM | POA: Diagnosis not present

## 2020-10-23 LAB — HEMOGLOBIN A1C
Hgb A1c MFr Bld: 6.1 % — ABNORMAL HIGH (ref 4.8–5.6)
Mean Plasma Glucose: 128.37 mg/dL

## 2020-10-23 LAB — GLUCOSE, CAPILLARY
Glucose-Capillary: 79 mg/dL (ref 70–99)
Glucose-Capillary: 126 mg/dL — ABNORMAL HIGH (ref 70–99)
Glucose-Capillary: 111 mg/dL — ABNORMAL HIGH (ref 70–99)
Glucose-Capillary: 158 mg/dL — ABNORMAL HIGH (ref 70–99)

## 2020-10-23 LAB — LIPID PANEL
Cholesterol: 107 mg/dL (ref 0–200)
HDL: 32 mg/dL — ABNORMAL LOW (ref >40)
LDL Cholesterol: 61 mg/dL (ref 0–99)
Total CHOL/HDL Ratio: 3.3 RATIO
Triglycerides: 72 mg/dL (ref <150)
VLDL: 14 mg/dL (ref 0–40)

## 2020-10-23 NOTE — Progress Notes (Signed)
Stroke Team Progress Note  SUBJECTIVE Patient is sitting up in bed.  She has no complaints.  She still has mild left-sided weakness and numbness which appears to be improving.  Transcranial Doppler study mildly elevated bilateral middle cerebral artery mean flow velocities of unclear significance.  Globally elevated pulsatility indices indicates diffuse intracranial atherosclerosis.  Carotid ultrasound shows no significant large vessel extracranial stenosis. Echocardiogram shows asymmetric hypertrophy of the left ventricular myocardium with ejection fraction of 60 to 65%. OBJECTIVE Most recent Vital Signs: Temp: 98 F (36.7 C) (11/10 1233) Temp Source: Oral (11/10 1233) BP: 136/58 (11/10 1233) Pulse Rate: 60 (11/10 1233) Respiratory Rate: 18 O2 Saturdation: 100%  CBG (last 3)  Recent Labs    10/22/20 2109 10/23/20 0612 10/23/20 1225  GLUCAP 179* 79 126*       Studies:   CT head No acute stroke. Stable R frontal and B basal ganglia and cerebellar lacunes. Presume R interior cerebellar subacute infarct. Small vessel disease. Atrophy. R ear cerumen impaction.  MRI  Small R thalamic infarct. Multiple old lacunes, small vessel disease. Atrophy.  TCD   bilateral elevated middle cerebral artery mean flow velocities of unclear significance.  Globally elevated pulsatility indices suggest diffuse intracranial atherosclerosis.  Carotid Doppler   bilateral 1-39% carotid stenosis.    2D Echo  EF of 60-65%.  Apical variant hypertrophic cardiomyopathy.  LDL  61 mg percent  HgbA1c  6.1  Physical Exam:  Pleasant elderly African-American lady sitting comfortably in bedside chair. Not in distress. . Afebrile. Head is nontraumatic. Neck is supple without bruit.    Cardiac exam no murmur or gallop. Lungs are clear to auscultation. Distal pulses are well felt. Neurological Exam :  She is awake alert oriented to place and person only. Diminished attention, registration and recall. Speech appears  clear without dysarthria or aphasia. Follows commands well. Extraocular movements are full range without nystagmus and she blinks to threat bilaterally. Mild left lower facial asymmetry. Tongue midline motor system exam no upper or lower extremity drift but weakness of left grip and intrinsic hand muscles and orbits right over left upper extremity. Trace weakness of left ankle dorsiflexors. Tone is slightly increased on the left compared to the right. Deep tendon reflexes are brisk. No clonus. Jaw jerk is brisk. Sensation appears preserved bilaterally. Gait not tested. ASSESSMENT [name] is a 74 y.o. 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     Hospital day # 4  TREATMENT/PLAN  Recommend dual antiplatelet therapy of aspirin Plavix for 3 weeks followed by Plavix alone and aggressive risk factor modification Mobilize out of bed and continue ongoing therapies.  Stroke team will sign off.  Follow-up as an outpatient stroke clinic in 6 weeks. I spent 25 minutes in total face-to-face time with the patient, more than 50% of which was spent in counseling and coordination of care, reviewing test results, reviewing medication and discussing or reviewing the diagnosis of    , the prognosis and treatment options.  Discussed with Dr. Lambert Keto, MD Medical Director [place] Pager: [phone] 10/23/2020 1:52 PM

## 2020-10-23 NOTE — Progress Notes (Addendum)
PROGRESS NOTE        PATIENT DETAILS Name: Desiree Pollard Age: 74 y.o. Sex: female Date of Birth: July 09, 1946 Admit Date: 10/18/2020 Admitting Physician Rise Patience, MD UDJ:[id number], No Pcp Per  Brief Narrative: Patient is a 74 y.o. 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  See below for further details.  Significant events: 11/5>> presented with dizziness-admit to [facility]  Significant studies: 11/5>> CT head: No acute intracranial abnormality 11/5>> chest x-ray: No active disease 11/8>> MRI brain: Small acute infarct in the right thalamus 11/9>> Echo: Apical variant hypertrophic cardiomyopathy, EF 78-58%, grade 1 diastolic dysfunction 85/02>> carotid Doppler: No significant stenosis 11/10>> LDL 61 11/10>> A1c: 6.1  Antimicrobial therapy: None  Microbiology data: None  Procedures : None  Consults: Neurology  DVT Prophylaxis : heparin injection 5,000 Units Start: 10/19/20 1400  Subjective: Lying comfortably in bed-denies dizziness.  Assessment/Plan: Acute CVA: Not sure if this accounts for patient's dizziness-could have had orthostatic hypotension-work-up as above-neurology following with recommendations to continue aspirin/Plavix x3 weeks then Plavix alone.  Awaiting SNF.  AKI on CKD 4: AKI likely hemodynamically mediated-improved-and close to baseline.  HTN: BP stable-continue Coreg and Procardia-lisinopril remains on hold.  DM-2: CBG stable-continue SSI.  Recent Labs    10/22/20 2109 10/23/20 0612 10/23/20 1225  GLUCAP 179* 79 126*   Acute on chronic anemia: No evidence of blood loss-FOBT negative-has anemia likely related to CKD at baseline-some worsening likely due to IV fluid hydration.  Stable for close monitoring by PCP in the outpatient setting.  Thrombocytopenia:  Stable-mild-continue outpatient follow follow-up.  History of complete heart block-s/p PPM placement: Paced rhythm on telemetry.  Apical variant hypertrophic cardiomyopathy: Reviewed outpatient echocardiograms in care everywhere-known issue-stable for outpatient follow-up with her primary cardiologist.  History of breast cancer: Apparently in remission-resume outpatient follow-up with oncology on discharge.  Diet: Diet Order            Diet heart healthy/carb modified Room service appropriate? Yes; Fluid consistency: Thin  Diet effective now                  Code Status: Full code   Family Communication: Sister-Christine [phone] the phone on 11/10  Disposition Plan: Status is: Inpatient  Remains inpatient appropriate because:Inpatient level of care appropriate due to severity of illness  Dispo: The patient is from: Home              Anticipated d/c is to: SNF vs HHPT (sister has changed her mind-and is wanting SNF)              Anticipated d/c date is: 1 day              Patient currently is not medically stable to d/c.  Barriers to Discharge:  Antimicrobial agents: Anti-infectives (From admission, onward)   None       Time spent: 25 minutes-Greater than 50% of this time was spent in counseling, explanation of diagnosis, planning of further management, and coordination of care.  MEDICATIONS: Scheduled Meds: . aspirin EC  81 mg Oral Daily  . atorvastatin  80 mg Oral Daily  . carvedilol  25 mg Oral BID WC  . clopidogrel  75 mg Oral Daily  . donepezil  10 mg Oral  QHS  . dorzolamide-timolol  1 drop Both Eyes BID  . ferrous sulfate  325 mg Oral Q breakfast  . heparin  5,000 Units Subcutaneous Q8H  . insulin aspart  0-6 Units Subcutaneous TID WC  . insulin glargine  4 Units Subcutaneous Daily  . latanoprost  1 drop Right Eye QHS  . NIFEdipine  60 mg Oral Daily  . pantoprazole  40 mg Oral BID   Continuous Infusions: PRN Meds:.acetaminophen **OR**  acetaminophen, hydrALAZINE   PHYSICAL EXAM: Vital signs: Vitals:   10/22/20 2312 10/23/20 0312 10/23/20 0834 10/23/20 1233  BP: (!) 148/73 (!) 155/73 (!) 159/61 (!) 136/58  Pulse: 60 (!) 59 64 60  Resp: 18 20 18 18   Temp: 98.2 F (36.8 C) 98.4 F (36.9 C) 98.1 F (36.7 C) 98 F (36.7 C)  TempSrc: Oral Oral Oral Oral  SpO2: 100% 100% 100% 100%  Weight:      Height:       Filed Weights   10/18/20 2325 10/19/20 1106  Weight: 70.3 kg 68.1 kg   Body mass index is 23.51 kg/m.   Gen Exam:Alert awake-not in any distress.  Chronically sick appearing. HEENT:atraumatic, normocephalic Chest: B/L clear to auscultation anteriorly CVS:S1S2 regular Abdomen:soft non tender, non distended Extremities:no edema Neurology: Mild left-sided hemiparesis. Skin: no rash  I have personally reviewed following labs and imaging studies  LABORATORY DATA: CBC: Recent Labs  Lab 10/19/20 0001 10/19/20 0644 10/20/20 0439 10/21/20 0125 10/22/20 0918  WBC 6.5 6.1 4.8 5.1 4.4  NEUTROABS 4.9 4.3 2.9 3.2 3.0  HGB 8.9* 7.8* 7.7* 7.6* 8.8*  HCT 29.1* 25.8* 24.5* 24.9* 28.6*  MCV 93.6 93.5 92.5 93.3 92.9  PLT 119* 149* 151 154 536    Basic Metabolic Panel: Recent Labs  Lab 10/19/20 0001 10/19/20 0644 10/20/20 0439 10/21/20 0125 10/22/20 0918  NA 143 144 145 144 145  K 4.0 3.3* 3.1* 3.7 4.1  CL 113* 116* 118* 120* 118*  CO2 19* 19* 20* 19* 19*  GLUCOSE 207* 135* 81 133* 152*  BUN 35* 36* 31* 30* 28*  CREATININE 3.31* 3.35* 2.97* 2.88* 2.82*  CALCIUM 8.6* 8.2* 8.1* 8.2* 8.9  MG  --   --  1.9 1.8  --   PHOS  --   --  3.6 3.2  --     GFR: Estimated Creatinine Clearance: 16.5 mL/min (A) (by C-G formula based on SCr of 2.82 mg/dL (H)).  Liver Function Tests: No results for input(s): AST, ALT, ALKPHOS, BILITOT, PROT, ALBUMIN in the last 168 hours. No results for input(s): LIPASE, AMYLASE in the last 168 hours. No results for input(s): AMMONIA in the last 168 hours.  Coagulation  Profile: No results for input(s): INR, PROTIME in the last 168 hours.  Cardiac Enzymes: No results for input(s): CKTOTAL, CKMB, CKMBINDEX, TROPONINI in the last 168 hours.  BNP (last 3 results) No results for input(s): PROBNP in the last 8760 hours.  Lipid Profile: Recent Labs    10/23/20 0348  CHOL 107  HDL 32*  LDLCALC 61  TRIG 72  CHOLHDL 3.3    Thyroid Function Tests: No results for input(s): TSH, T4TOTAL, FREET4, T3FREE, THYROIDAB in the last 72 hours.  Anemia Panel: No results for input(s): VITAMINB12, FOLATE, FERRITIN, TIBC, IRON, RETICCTPCT in the last 72 hours.  Urine analysis: No results found for: COLORURINE, APPEARANCEUR, LABSPEC, PHURINE, GLUCOSEU, HGBUR, BILIRUBINUR, KETONESUR, PROTEINUR, UROBILINOGEN, NITRITE, LEUKOCYTESUR  Sepsis Labs: Lactic Acid, Venous No results found for: LATICACIDVEN  MICROBIOLOGY: Recent Results (from  the past 240 hour(s))  Respiratory Panel by RT PCR (Flu A&B, Covid) - Nasopharyngeal Swab     Status: None   Collection Time: 10/19/20  5:40 AM   Specimen: Nasopharyngeal Swab  Result Value Ref Range Status   SARS Coronavirus 2 by RT PCR NEGATIVE NEGATIVE Final    Comment: (NOTE) SARS-CoV-2 target nucleic acids are NOT DETECTED.  The SARS-CoV-2 RNA is generally detectable in upper respiratoy specimens during the acute phase of infection. The lowest concentration of SARS-CoV-2 viral copies this assay can detect is 131 copies/mL. A negative result does not preclude SARS-Cov-2 infection and should not be used as the sole basis for treatment or other patient management decisions. A negative result may occur with  improper specimen collection/handling, submission of specimen other than nasopharyngeal swab, presence of viral mutation(s) within the areas targeted by this assay, and inadequate number of viral copies (<131 copies/mL). A negative result must be combined with clinical observations, patient history, and epidemiological  information. The expected result is Negative.  Fact Sheet for Patients:  PinkCheek.be  Fact Sheet for Healthcare Providers:  GravelBags.it  This test is no t yet approved or cleared by the [place] FDA and  has been authorized for detection and/or diagnosis of SARS-CoV-2 by FDA under an Emergency Use Authorization (EUA). This EUA will remain  in effect (meaning this test can be used) for the duration of the COVID-19 declaration under Section 564(b)(1) of the Act, 21 U.S.C. section 360bbb-3(b)(1), unless the authorization is terminated or revoked sooner.     Influenza A by PCR NEGATIVE NEGATIVE Final   Influenza B by PCR NEGATIVE NEGATIVE Final    Comment: (NOTE) The Xpert Xpress SARS-CoV-2/FLU/RSV assay is intended as an aid in  the diagnosis of influenza from Nasopharyngeal swab specimens and  should not be used as a sole basis for treatment. Nasal washings and  aspirates are unacceptable for Xpert Xpress SARS-CoV-2/FLU/RSV  testing.  Fact Sheet for Patients: PinkCheek.be  Fact Sheet for Healthcare Providers: GravelBags.it  This test is not yet approved or cleared by the [place] FDA and  has been authorized for detection and/or diagnosis of SARS-CoV-2 by  FDA under an Emergency Use Authorization (EUA). This EUA will remain  in effect (meaning this test can be used) for the duration of the  Covid-19 declaration under Section 564(b)(1) of the Act, 21  U.S.C. section 360bbb-3(b)(1), unless the authorization is  terminated or revoked. Performed at [facility], [place] [address], [place], [place] [address]     RADIOLOGY STUDIES/RESULTS: MR BRAIN WO CONTRAST  Result Date: 10/21/2020 CLINICAL DATA:  Neuro deficit, acute stroke suspected. EXAM: MRI HEAD WITHOUT CONTRAST TECHNIQUE: Multiplanar, multiecho pulse sequences of the brain and surrounding  structures were obtained without intravenous contrast. COMPARISON:  CT head October 18, 2020. FINDINGS: Brain: Small acute infarct in the right thalamus. Mild associated edema without mass effect. No acute hemorrhage. Remote infarcts in bilateral cerebellar hemispheres, bilateral basal ganglia, and right frontal white matter. There is ex vacuo dilation of the fourth ventricle and lateral ventricles. Additional scattered T2/FLAIR hyperintensities within the white matter are likely the sequela of chronic microvascular ischemic disease. Vascular: Major arterial flow voids are maintained at the skull base. Skull and upper cervical spine: Normal marrow signal. Sinuses/Orbits: Atretic left maxillary sinus with mild scattered paranasal sinus mucosal thickening. Unremarkable orbits. Other: No mastoid effusions. IMPRESSION: 1. Small acute infarct in the right thalamus. 2. Multiple remote lacunar infarcts, chronic microvascular ischemic disease, and  generalized cerebral volume loss. Electronically Signed   By: Margaretha Sheffield MD   On: 10/21/2020 14:32   ECHOCARDIOGRAM COMPLETE  Result Date: 10/22/2020    ECHOCARDIOGRAM REPORT   Patient Name:   Latysha Kozak Date of Exam: 10/22/2020 Medical Rec #:  [id number]     Height:       67.0 in Accession #:    [id number]    Weight:       150.1 lb Date of Birth:  02-21-1946     BSA:          1.790 m Patient Age:    2 years      BP:           161/69 mmHg Patient Gender: F             HR:           60 bpm. Exam Location:  Inpatient Procedure: 2D Echo, 3D Echo, Cardiac Doppler and Color Doppler Indications:    Stroke  History:        Patient has no prior history of Echocardiogram examinations.                 Pacemaker and Abnormal ECG, Stroke,                 Signs/Symptoms:Dizziness/Lightheadedness; Risk                 Factors:Hypertension and Diabetes. ESRD. Breast cancer.  Sonographer:    Roseanna Rainbow RDCS Referring Phys: [id number] [facility]  Sonographer Comments: Image acquisition  challenging due to mastectomy. IMPRESSIONS  1. There is severe asymmetric hypertrophy of the apical left ventricular myocardium. Findings suggest apical variant hypertrophic cardiomyopathy. Left ventricular ejection fraction, by estimation, is 60 to 65%. The left ventricle has normal function. The left ventricle has no regional wall motion abnormalities. There is moderate concentric left ventricular hypertrophy as well. Left ventricular diastolic parameters are consistent with Grade I diastolic dysfunction (impaired relaxation). Elevated left atrial pressure.  2. Right ventricular systolic function is normal. The right ventricular size is normal. Mildly increased right ventricular wall thickness. There is moderately elevated pulmonary artery systolic pressure.  3. Left atrial size was mildly dilated.  4. Right atrial size was mildly dilated.  5. The mitral valve is normal in structure. Mild mitral valve regurgitation.  6. Tricuspid valve regurgitation is moderate.  7. The aortic valve is tricuspid. There is mild calcification of the aortic valve. There is mild thickening of the aortic valve. Aortic valve regurgitation is not visualized. Mild aortic valve sclerosis is present, with no evidence of aortic valve stenosis.  8. There is Moderate (Grade III) protruding plaque involving the transverse aorta.  9. The inferior vena cava is dilated in size with <50% respiratory variability, suggesting right atrial pressure of 15 mmHg. FINDINGS  Left Ventricle: There is severe asymmetric hypertrophy of the apical left ventricular myocardium. Findings suggest apical variant hypertrophic cardiomyopathy. Left ventricular ejection fraction, by estimation, is 60 to 65%. The left ventricle has normal  function. The left ventricle has no regional wall motion abnormalities. The left ventricular internal cavity size was normal in size. There is moderate concentric left ventricular hypertrophy. Left ventricular diastolic parameters are  consistent with Grade I diastolic dysfunction (impaired relaxation). Elevated left atrial pressure. Right Ventricle: The right ventricular size is normal. Mildly increased right ventricular wall thickness. Right ventricular systolic function is normal. There is moderately elevated pulmonary artery systolic pressure. The tricuspid regurgitant velocity is 3.02  m/s, and with an assumed right atrial pressure of 15 mmHg, the estimated right ventricular systolic pressure is 19.3 mmHg. Left Atrium: Left atrial size was mildly dilated. Right Atrium: Right atrial size was mildly dilated. Pericardium: There is no evidence of pericardial effusion. Mitral Valve: The mitral valve is normal in structure. Mild mitral valve regurgitation, with centrally-directed jet. Tricuspid Valve: The tricuspid valve is normal in structure. Tricuspid valve regurgitation is moderate. Aortic Valve: The aortic valve is tricuspid. There is mild calcification of the aortic valve. There is mild thickening of the aortic valve. Aortic valve regurgitation is not visualized. Mild aortic valve sclerosis is present, with no evidence of aortic valve stenosis. Pulmonic Valve: The pulmonic valve was grossly normal. Pulmonic valve regurgitation is not visualized. Aorta: The aortic root and ascending aorta are structurally normal, with no evidence of dilitation. There is moderate (Grade III) protruding plaque involving the transverse aorta. Venous: The inferior vena cava is dilated in size with less than 50% respiratory variability, suggesting right atrial pressure of 15 mmHg. IAS/Shunts: No atrial level shunt detected by color flow Doppler. Additional Comments: A pacer wire is visualized.  LEFT VENTRICLE PLAX 2D LVIDd:         4.50 cm     Diastology LVIDs:         2.70 cm     LV e' medial:    4.47 cm/s LV PW:         2.00 cm     LV E/e' medial:  17.6 LV IVS:        1.60 cm     LV e' lateral:   6.05 cm/s LVOT diam:     1.60 cm     LV E/e' lateral: 13.0 LV SV:          58 LV SV Index:   33 LVOT Area:     2.01 cm  LV Volumes (MOD) LV vol d, MOD A2C: 82.9 ml LV vol d, MOD A4C: 86.8 ml LV vol s, MOD A2C: 22.6 ml LV vol s, MOD A4C: 23.2 ml LV SV MOD A2C:     60.3 ml LV SV MOD A4C:     86.8 ml LV SV MOD BP:      62.1 ml RIGHT VENTRICLE             IVC RV S prime:     11.10 cm/s  IVC diam: 2.40 cm TAPSE (M-mode): 1.8 cm LEFT ATRIUM             Index       RIGHT ATRIUM           Index LA diam:        3.90 cm 2.18 cm/m  RA Area:     18.60 cm LA Vol (A2C):   61.3 ml 34.24 ml/m RA Volume:   60.80 ml  33.96 ml/m LA Vol (A4C):   51.2 ml 28.60 ml/m LA Biplane Vol: 55.3 ml 30.89 ml/m  AORTIC VALVE LVOT Vmax:   117.00 cm/s LVOT Vmean:  81.100 cm/s LVOT VTI:    0.290 m  AORTA Ao Root diam: 3.20 cm Ao Asc diam:  2.70 cm MITRAL VALVE                TRICUSPID VALVE MV Area (PHT): 3.48 cm     TR Peak grad:   36.5 mmHg MV Decel Time: 218 msec     TR Vmax:        302.00 cm/s MV  E velocity: 78.80 cm/s MV A velocity: 133.00 cm/s  SHUNTS MV E/A ratio:  0.59         Systemic VTI:  0.29 m                             Systemic Diam: 1.60 cm Dani Gobble Croitoru MD Electronically signed by Sanda Klein MD Signature Date/Time: 10/22/2020/3:12:57 PM    Final    VAS US CAROTID  Result Date: 10/23/2020 Carotid Arterial Duplex Study Indications:       CVA. Risk Factors:      Hypertension, Diabetes. Comparison Study:  No prior study Performing Technologist: Maudry Mayhew MHA, RDMS, RVT, RDCS  Examination Guidelines: A complete evaluation includes B-mode imaging, spectral Doppler, color Doppler, and power Doppler as needed of all accessible portions of each vessel. Bilateral testing is considered an integral part of a complete examination. Limited examinations for reoccurring indications may be performed as noted.  Right Carotid Findings: +----------+--------+--------+--------+-------------------------+--------+           PSV cm/sEDV cm/sStenosisPlaque Description       Comments  +----------+--------+--------+--------+-------------------------+--------+ CCA Prox  88      16              hyperechoic and irregular         +----------+--------+--------+--------+-------------------------+--------+ CCA Distal84      14              smooth and heterogenous           +----------+--------+--------+--------+-------------------------+--------+ ICA Prox  50      11              smooth and homogeneous            +----------+--------+--------+--------+-------------------------+--------+ ICA Distal76      23                                                +----------+--------+--------+--------+-------------------------+--------+ ECA       73      7                                                 +----------+--------+--------+--------+-------------------------+--------+ +----------+--------+-------+----------------+-------------------+           PSV cm/sEDV cmsDescribe        Arm Pressure (mmHG) +----------+--------+-------+----------------+-------------------+ [id number]            Multiphasic, WNL                    +----------+--------+-------+----------------+-------------------+ +---------+--------+--+--------+--+---------+ VertebralPSV cm/s39EDV cm/s14Antegrade +---------+--------+--+--------+--+---------+  Left Carotid Findings: +----------+--------+--------+--------+-----------------------+--------+           PSV cm/sEDV cm/sStenosisPlaque Description     Comments +----------+--------+--------+--------+-----------------------+--------+ CCA Prox  91      19                                              +----------+--------+--------+--------+-----------------------+--------+ CCA Distal82      18              smooth and heterogenous         +----------+--------+--------+--------+-----------------------+--------+  ICA Prox  62      12              heterogenous                     +----------+--------+--------+--------+-----------------------+--------+ ICA Distal61      20                                              +----------+--------+--------+--------+-----------------------+--------+ ECA       98      12                                              +----------+--------+--------+--------+-----------------------+--------+ +----------+--------+--------+----------------+-------------------+           PSV cm/sEDV cm/sDescribe        Arm Pressure (mmHG) +----------+--------+--------+----------------+-------------------+ [id number]             Multiphasic, WNL                    +----------+--------+--------+----------------+-------------------+ +---------+--------+--+--------+--+---------+ VertebralPSV cm/s81EDV cm/s19Antegrade +---------+--------+--+--------+--+---------+   Summary: Right Carotid: Velocities in the right ICA are consistent with a 1-39% stenosis. Left Carotid: Velocities in the left ICA are consistent with a 1-39% stenosis. Vertebrals:  Bilateral vertebral arteries demonstrate antegrade flow. Subclavians: Normal flow hemodynamics were seen in bilateral subclavian              arteries. *See table(s) above for measurements and observations.     Preliminary    VAS [place] TRANSCRANIAL DOPPLER  Result Date: 10/23/2020  Transcranial Doppler Indications: Stroke. History: Hypertension, diabetes mellitus. Limitations: Suboptimal acoustic windows Comparison Study: No prior study Performing Technologist: Darlin Coco, RDMS Supporting Technologist: Maudry Mayhew MHA, RDMS, RVT, RDCS  Examination Guidelines: A complete evaluation includes B-mode imaging, spectral Doppler, color Doppler, and power Doppler as needed of all accessible portions of each vessel. Bilateral testing is considered an integral part of a complete examination. Limited examinations for reoccurring indications may be performed as noted.   +----------+-------------+----------+-----------+-------+ RIGHT TCD Right VM (cm)Depth (cm)PulsatilityComment +----------+-------------+----------+-----------+-------+ MCA           85.00                 1.34            +----------+-------------+----------+-----------+-------+ ACA          -28.00                 1.17            +----------+-------------+----------+-----------+-------+ Term ICA      28.00                 1.43            +----------+-------------+----------+-----------+-------+ PCA           32.00                 1.32            +----------+-------------+----------+-----------+-------+ Opthalmic     15.00                 1.56            +----------+-------------+----------+-----------+-------+ ICA siphon    14.00  1.58            +----------+-------------+----------+-----------+-------+ Vertebral    -20.00                 1.34            +----------+-------------+----------+-----------+-------+  +----------+------------+----------+-----------+------------------+ LEFT TCD  Left VM (cm)Depth (cm)Pulsatility     Comment       +----------+------------+----------+-----------+------------------+ MCA          73.00                 1.26                       +----------+------------+----------+-----------+------------------+ ACA                                        Unable to insonate +----------+------------+----------+-----------+------------------+ Term ICA     38.00                 1.31                       +----------+------------+----------+-----------+------------------+ PCA          46.00                 1.59                       +----------+------------+----------+-----------+------------------+ Opthalmic    18.00                 1.90                       +----------+------------+----------+-----------+------------------+ ICA siphon   45.00                 1.27                        +----------+------------+----------+-----------+------------------+ Vertebral    -30.00                1.42                       +----------+------------+----------+-----------+------------------+  +------------+-------+------------------+             VM cm/s     Comment       +------------+-------+------------------+ Prox Basilar       Unable to insonate +------------+-------+------------------+    Preliminary      LOS: 4 days   Oren Binet, MD  [facility]    To contact the attending provider between 7A-7P or the covering provider during after hours 7P-7A, please log into the web site [email or web address] and access using universal [place] password for that web site. If you do not have the password, please call the hospital operator.  10/23/2020, 1:24 PM

## 2020-10-23 NOTE — Progress Notes (Signed)
Carotid artery duplex and TCD completed. Refer to "CV Proc" under chart review to view preliminary results.  10/23/2020 11:29 AM Kelby Aline., MHA, RVT, RDCS, RDMS   Darlin Coco, RDMS

## 2020-10-23 NOTE — TOC Progression Note (Signed)
Transition of Care [facility]) - Progression Note    Patient Details  Name: Desiree Pollard MRN: [id number] Date of Birth: 03-11-1946  Transition of Care [facility]) CM/SW [place], [place] Phone Number: 10/23/2020, 2:28 PM  Clinical Narrative:   CSW alerted by MD that patient's sister has now changed her mind and wants to pursue SNF. CSW spoke with sister, Desiree Pollard, via phone to discuss SNF options. CSW provided bed offers and sister to review and determine choice. SNF will need to start insurance authorization once selected. CSW to follow.    Expected Discharge Plan: Skilled Nursing Facility Barriers to Discharge: Ship broker, Continued Medical Work up  Expected Discharge Plan and Services Expected Discharge Plan: [place] arrangements for the past 2 months: Single Family Home                                       Social Determinants of Health (SDOH) Interventions    Readmission Risk Interventions No flowsheet data found.

## 2020-10-24 ENCOUNTER — Telehealth: Payer: Self-pay | Admitting: Gastroenterology

## 2020-10-24 LAB — GLUCOSE, CAPILLARY
Glucose-Capillary: 87 mg/dL (ref 70–99)
Glucose-Capillary: 145 mg/dL — ABNORMAL HIGH (ref 70–99)
Glucose-Capillary: 156 mg/dL — ABNORMAL HIGH (ref 70–99)
Glucose-Capillary: 129 mg/dL — ABNORMAL HIGH (ref 70–99)

## 2020-10-24 MED ORDER — NIFEDIPINE ER 60 MG PO TB24
60.0000 mg | ORAL_TABLET | Freq: Every day | ORAL | Status: DC
Start: 2020-10-24 — End: 2020-11-21

## 2020-10-24 MED ORDER — CLOPIDOGREL BISULFATE 75 MG PO TABS
75.0000 mg | ORAL_TABLET | Freq: Every day | ORAL | Status: DC
Start: 2020-10-24 — End: 2021-02-19

## 2020-10-24 MED ORDER — ASPIRIN EC 81 MG PO TBEC
81.0000 mg | DELAYED_RELEASE_TABLET | Freq: Every day | ORAL | 11 refills | Status: DC
Start: 2020-10-24 — End: 2020-10-28

## 2020-10-24 MED ORDER — INSULIN ASPART 100 UNIT/ML ~~LOC~~ SOLN
SUBCUTANEOUS | 11 refills | Status: DC
Start: 2020-10-24 — End: 2021-01-02

## 2020-10-24 MED ORDER — PANTOPRAZOLE SODIUM 40 MG PO TBEC
40.0000 mg | DELAYED_RELEASE_TABLET | Freq: Every day | ORAL | Status: DC
Start: 2020-10-24 — End: 2021-02-19

## 2020-10-24 NOTE — Discharge Summary (Addendum)
PATIENT DETAILS Name: Desiree Pollard Age: 74 y.o. Sex: female Date of Birth: 05/06/1946 MRN: [id number]. Admitting Physician: Rise Patience, MD NWG:[id number], No Pcp Per  Admit Date: 10/18/2020 Discharge date: 10/25/2020  Recommendations for Outpatient Follow-up:  1. Follow up with PCP in 1-2 weeks 2. Please obtain CMP/CBC in one week 3. Please ensure follow up with neurology. 4. Please ensure follow-up with primary cardiology 5. Aspirin/Plavix x3 weeks followed by Plavix alone  Admitted From:  Home  Disposition: SNF   Home Health: No  Equipment/Devices: None  Discharge Condition: Stable  CODE STATUS: FULL CODE  Diet recommendation:  Diet Order            Diet - low sodium heart healthy           Diet heart healthy/carb modified Room service appropriate? Yes; Fluid consistency: Thin  Diet effective now                   Brief Narrative: Patient is a 74 y.o. 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  See below for further details.  Significant events: 11/5>> presented with dizziness-admit to [facility]  Significant studies: 11/5>> CT head: No acute intracranial abnormality 11/5>> chest x-ray: No active disease 11/8>> MRI brain: Small acute infarct in the right thalamus 11/9>> Echo: Apical variant hypertrophic cardiomyopathy, EF 86-57%, grade 1 diastolic dysfunction 84/69>> carotid Doppler: No significant stenosis 11/10>> LDL 61 11/10>> A1c: 6.1  Antimicrobial therapy: None  Microbiology data: None  Procedures : None  Consults: Neurology  Brief Hospital Course: Acute CVA: Not sure if this accounts for patient's dizziness-could have had orthostatic hypotension-work-up as above-neurology following with recommendations to continue aspirin/Plavix x3 weeks then Plavix alone.    Plan is to discharge to  SNF.  Please ensure patient follows up with neurology in the outpatient setting.  AKI on CKD 4: AKI likely hemodynamically mediated-improved-and close to baseline.  HTN: BP stable-continue Coreg and Procardia-lisinopril remains on hold-Procardia dose increased to 60 mg.  Please reassess and optimize at SNF.  DM-2: CBG stable-continue SSI on discharge  Acute on chronic anemia: No evidence of blood loss-FOBT negative-has anemia likely related to CKD at baseline-some worsening likely due to IV fluid hydration.  Stable for close monitoring by PCP in the outpatient setting.  Thrombocytopenia: Stable-mild-continue outpatient follow follow-up.  History of complete heart block-s/p PPM placement: Paced rhythm on telemetry.  Apical variant hypertrophic cardiomyopathy: Reviewed outpatient echocardiograms in care everywhere-known issue-stable for outpatient follow-up with her primary cardiologist.  History of breast cancer: Apparently in remission-resume outpatient follow-up with oncology on discharge.   Discharge Diagnoses:  Principal Problem:   Dizziness Active Problems:   Insulin dependent type 2 diabetes mellitus (HCC)   Hypertension   History of stroke   ARF (acute renal failure) (HCC)   Thrombocytopenia (HCC)   Discharge Instructions:  Activity:  As tolerated with Full fall precautions use walker/cane & assistance as needed Discharge Instructions    Ambulatory referral to Neurology   Complete by: As directed    An appointment is requested in approximately: 4 weeks   Call MD for:  difficulty breathing, headache or visual disturbances   Complete by: As directed    Diet - low sodium heart healthy   Complete by: As directed    Discharge instructions   Complete by: As directed    Follow with Primary MD  in  1-2 weeks  Please ensure follow-up with primary cardiology and stroke clinic.  Please get a complete blood count and chemistry panel checked by your Primary MD at your  next visit, and again as instructed by your Primary MD.  Get Medicines reviewed and adjusted: Please take all your medications with you for your next visit with your Primary MD  Laboratory/radiological data: Please request your Primary MD to go over all hospital tests and procedure/radiological results at the follow up, please ask your Primary MD to get all Hospital records sent to his/her office.  In some cases, they will be blood work, cultures and biopsy results pending at the time of your discharge. Please request that your primary care M.D. follows up on these results.  Also Note the following: If you experience worsening of your admission symptoms, develop shortness of breath, life threatening emergency, suicidal or homicidal thoughts you must seek medical attention immediately by calling 911 or calling your MD immediately  if symptoms less severe.  You must read complete instructions/literature along with all the possible adverse reactions/side effects for all the Medicines you take and that have been prescribed to you. Take any new Medicines after you have completely understood and accpet all the possible adverse reactions/side effects.   Do not drive when taking Pain medications or sleeping medications (Benzodaizepines)  Do not take more than prescribed Pain, Sleep and Anxiety Medications. It is not advisable to combine anxiety,sleep and pain medications without talking with your primary care practitioner  Special Instructions: If you have smoked or chewed Tobacco  in the last 2 yrs please stop smoking, stop any regular Alcohol  and or any Recreational drug use.  Wear Seat belts while driving.  Please note: You were cared for by a hospitalist during your hospital stay. Once you are discharged, your primary care physician will handle any further medical issues. Please note that NO REFILLS for any discharge medications will be authorized once you are discharged, as it is imperative  that you return to your primary care physician (or establish a relationship with a primary care physician if you do not have one) for your post hospital discharge needs so that they can reassess your need for medications and monitor your lab values.   Check CBGs before meals and at bedtime.  Aspirin/Plavix x3 weeks followed by Plavix alone.   Increase activity slowly   Complete by: As directed      Allergies as of 10/25/2020      Reactions   Amlodipine Nausea And Vomiting   Pt refuses to take due to severe n/v that began after starting amlodipine and stopped after discontinuation.      Medication List    STOP taking these medications   ciprofloxacin 250 MG tablet Commonly known as: CIPRO   insulin glargine 100 UNIT/ML injection Commonly known as: LANTUS   Klor-Con M20 20 MEQ tablet Generic drug: potassium chloride SA   latanoprost 0.005 % ophthalmic solution Commonly known as: Xalatan   lisinopril 10 MG tablet Commonly known as: ZESTRIL   Plenvu 140 g Solr Generic drug: PEG-KCl-NaCl-NaSulf-Na Asc-C     TAKE these medications   Accu-Chek FastClix Lancets Misc 1 each by Other route as directed. To test blood glucose daily.   aspirin EC 81 MG tablet Take 1 tablet (81 mg total) by mouth daily. Aspirin with Plavix for 3 weeks-followed by Plavix alone-stop aspirin after 3 weeks. What changed: additional instructions   atorvastatin 80 MG tablet Commonly known as: LIPITOR Take  80 mg by mouth daily.   carvedilol 25 MG tablet Commonly known as: COREG Take 25 mg by mouth 2 (two) times daily with a meal.   clopidogrel 75 MG tablet Commonly known as: PLAVIX Take 1 tablet (75 mg total) by mouth daily.   donepezil 10 MG tablet Commonly known as: ARICEPT Take 10 mg by mouth at bedtime.   dorzolamide-timolol 22.3-6.8 MG/ML ophthalmic solution Commonly known as: COSOPT INSTILL 1 DROP INTO BOTH EYES TWICE A DAY What changed: See the new instructions.   ferrous sulfate  325 (65 FE) MG tablet Take 325 mg by mouth daily with breakfast.   Fifty50 Glucose Meter 2.0 w/Device Kit 1 each by Other route See admin instructions. Use to check blood sugars   Fifty50 Pen Needles 32G X 4 MM Misc Generic drug: Insulin Pen Needle 1 each by Other route See admin instructions. Use as instructed.   insulin aspart 100 UNIT/ML injection Commonly known as: novoLOG 0-6 Units, Subcutaneous, 3 times daily with meals CBG < 70: Implement Hypoglycemia measures CBG 70 - 120: 0 units CBG 121 - 150: 0 units CBG 151 - 200: 1 unit CBG 201-250: 2 units CBG 251-300: 3 units CBG 301-350: 4 units CBG 351-400: 5 units CBG > 400: Give 6 units and call MD   IRON PO Take 1 tablet by mouth daily. Natures bounty otc.   loperamide 2 MG tablet Commonly known as: Imodium A-D Take 1 tablet (2 mg total) by mouth 4 (four) times daily as needed for diarrhea or loose stools.   NIFEdipine 60 MG 24 hr tablet Commonly known as: ADALAT CC Take 1 tablet (60 mg total) by mouth daily. What changed: medication strength   pantoprazole 40 MG tablet Commonly known as: PROTONIX Take 1 tablet (40 mg total) by mouth daily.       Contact information for follow-up providers    Primary care MD. Schedule an appointment as soon as possible for a visit in 2 week(s).        [facility] Follow up.   Why: office will call Contact information: [address]     [address] [place] [phone]           Contact information for after-discharge care    Destination    [facility] .   Service: Skilled Nursing Contact information: [place] [phone]                 Allergies  Allergen Reactions  . Amlodipine Nausea And Vomiting    Pt refuses to take due to severe n/v that began after starting amlodipine and stopped after discontinuation.      Other Procedures/Studies: CT HEAD WO  CONTRAST  Result Date: 10/18/2020 CLINICAL DATA:  Several days of dizziness, recent antihypertensive medication change EXAM: CT HEAD WITHOUT CONTRAST TECHNIQUE: Contiguous axial images were obtained from the base of the skull through the vertex without intravenous contrast. COMPARISON:  CT 12/26/2019 FINDINGS: Brain: Stable region of encephalomalacia along the subcortical white matter adjacent the anterior horn of the right lateral ventricle. Additional bilateral lacunar type infarcts and encephalomalacia seen in the bilateral cerebellar hemispheres including some expected progressive encephalomalacia along the right inferior cerebellum corresponding with a presumed subacute infarct on comparison CT. No new CT evident areas of large vascular territory infarct or cortically based infarct are seen. No evidence of acute hemorrhage, hydrocephalus, extra-axial collection, visible mass lesion or mass effect. Patchy areas of white  matter hypoattenuation are most compatible with chronic microvascular angiopathy. Symmetric prominence of the ventricles, cisterns and sulci compatible with parenchymal volume loss. Vascular: Atherosclerotic calcification of the carotid siphons and intradural vertebral arteries. No hyperdense vessel. Skull: No calvarial fracture or suspicious osseous lesion. No scalp swelling or hematoma. Sinuses/Orbits: Paranasal sinuses and mastoid air cells are predominantly clear. Orbital structures are unremarkable aside from prior lens extractions. Other: Debris in the right external auditory canal. Bilateral TMJ arthrosis. IMPRESSION: 1. No acute intracranial abnormality. 2. Stable appearance of right frontal encephalomalacia and bilateral lacunar type infarcts in the basal ganglia and cerebellar hemispheres. Expected evolution of encephalomalacia along the right inferior cerebellum corresponding with a presumed subacute infarct on comparison CT. 3. Chronic microvascular angiopathy and parenchymal volume  loss. 4. Debris in the right external auditory canal, correlate for cerumen impaction. Electronically Signed   By: Lovena Le M.D.   On: 10/18/2020 23:54   MR BRAIN WO CONTRAST  Result Date: 10/21/2020 CLINICAL DATA:  Neuro deficit, acute stroke suspected. EXAM: MRI HEAD WITHOUT CONTRAST TECHNIQUE: Multiplanar, multiecho pulse sequences of the brain and surrounding structures were obtained without intravenous contrast. COMPARISON:  CT head October 18, 2020. FINDINGS: Brain: Small acute infarct in the right thalamus. Mild associated edema without mass effect. No acute hemorrhage. Remote infarcts in bilateral cerebellar hemispheres, bilateral basal ganglia, and right frontal white matter. There is ex vacuo dilation of the fourth ventricle and lateral ventricles. Additional scattered T2/FLAIR hyperintensities within the white matter are likely the sequela of chronic microvascular ischemic disease. Vascular: Major arterial flow voids are maintained at the skull base. Skull and upper cervical spine: Normal marrow signal. Sinuses/Orbits: Atretic left maxillary sinus with mild scattered paranasal sinus mucosal thickening. Unremarkable orbits. Other: No mastoid effusions. IMPRESSION: 1. Small acute infarct in the right thalamus. 2. Multiple remote lacunar infarcts, chronic microvascular ischemic disease, and generalized cerebral volume loss. Electronically Signed   By: Margaretha Sheffield MD   On: 10/21/2020 14:32   DG Chest Port 1 View  Result Date: 10/18/2020 CLINICAL DATA:  Dizziness EXAM: PORTABLE CHEST 1 VIEW COMPARISON:  None. FINDINGS: The heart size and mediastinal contours are borderline enlarged. Aortic knob calcifications are seen. A right-sided pacemaker seen with the lead tips in the right atrium right ventricle. Both lungs are clear. The visualized skeletal structures are unremarkable. Surgical clips seen within the left axilla. IMPRESSION: No active disease. Electronically Signed   By: Prudencio Pair  M.D.   On: 10/18/2020 23:40   ECHOCARDIOGRAM COMPLETE  Result Date: 10/22/2020    ECHOCARDIOGRAM REPORT   Patient Name:   Tracia Fix Date of Exam: 10/22/2020 Medical Rec #:  [id number]     Height:       67.0 in Accession #:    [id number]    Weight:       150.1 lb Date of Birth:  17-Feb-1946     BSA:          1.790 m Patient Age:    16 years      BP:           161/69 mmHg Patient Gender: F             HR:           60 bpm. Exam Location:  Inpatient Procedure: 2D Echo, 3D Echo, Cardiac Doppler and Color Doppler Indications:    Stroke  History:        Patient has no prior history of Echocardiogram examinations.  Pacemaker and Abnormal ECG, Stroke,                 Signs/Symptoms:Dizziness/Lightheadedness; Risk                 Factors:Hypertension and Diabetes. ESRD. Breast cancer.  Sonographer:    Roseanna Rainbow RDCS Referring Phys: [id number] [facility]  Sonographer Comments: Image acquisition challenging due to mastectomy. IMPRESSIONS  1. There is severe asymmetric hypertrophy of the apical left ventricular myocardium. Findings suggest apical variant hypertrophic cardiomyopathy. Left ventricular ejection fraction, by estimation, is 60 to 65%. The left ventricle has normal function. The left ventricle has no regional wall motion abnormalities. There is moderate concentric left ventricular hypertrophy as well. Left ventricular diastolic parameters are consistent with Grade I diastolic dysfunction (impaired relaxation). Elevated left atrial pressure.  2. Right ventricular systolic function is normal. The right ventricular size is normal. Mildly increased right ventricular wall thickness. There is moderately elevated pulmonary artery systolic pressure.  3. Left atrial size was mildly dilated.  4. Right atrial size was mildly dilated.  5. The mitral valve is normal in structure. Mild mitral valve regurgitation.  6. Tricuspid valve regurgitation is moderate.  7. The aortic valve is tricuspid. There is mild  calcification of the aortic valve. There is mild thickening of the aortic valve. Aortic valve regurgitation is not visualized. Mild aortic valve sclerosis is present, with no evidence of aortic valve stenosis.  8. There is Moderate (Grade III) protruding plaque involving the transverse aorta.  9. The inferior vena cava is dilated in size with <50% respiratory variability, suggesting right atrial pressure of 15 mmHg. FINDINGS  Left Ventricle: There is severe asymmetric hypertrophy of the apical left ventricular myocardium. Findings suggest apical variant hypertrophic cardiomyopathy. Left ventricular ejection fraction, by estimation, is 60 to 65%. The left ventricle has normal  function. The left ventricle has no regional wall motion abnormalities. The left ventricular internal cavity size was normal in size. There is moderate concentric left ventricular hypertrophy. Left ventricular diastolic parameters are consistent with Grade I diastolic dysfunction (impaired relaxation). Elevated left atrial pressure. Right Ventricle: The right ventricular size is normal. Mildly increased right ventricular wall thickness. Right ventricular systolic function is normal. There is moderately elevated pulmonary artery systolic pressure. The tricuspid regurgitant velocity is 3.02 m/s, and with an assumed right atrial pressure of 15 mmHg, the estimated right ventricular systolic pressure is 73.7 mmHg. Left Atrium: Left atrial size was mildly dilated. Right Atrium: Right atrial size was mildly dilated. Pericardium: There is no evidence of pericardial effusion. Mitral Valve: The mitral valve is normal in structure. Mild mitral valve regurgitation, with centrally-directed jet. Tricuspid Valve: The tricuspid valve is normal in structure. Tricuspid valve regurgitation is moderate. Aortic Valve: The aortic valve is tricuspid. There is mild calcification of the aortic valve. There is mild thickening of the aortic valve. Aortic valve  regurgitation is not visualized. Mild aortic valve sclerosis is present, with no evidence of aortic valve stenosis. Pulmonic Valve: The pulmonic valve was grossly normal. Pulmonic valve regurgitation is not visualized. Aorta: The aortic root and ascending aorta are structurally normal, with no evidence of dilitation. There is moderate (Grade III) protruding plaque involving the transverse aorta. Venous: The inferior vena cava is dilated in size with less than 50% respiratory variability, suggesting right atrial pressure of 15 mmHg. IAS/Shunts: No atrial level shunt detected by color flow Doppler. Additional Comments: A pacer wire is visualized.  LEFT VENTRICLE PLAX 2D LVIDd:  4.50 cm     Diastology LVIDs:         2.70 cm     LV e' medial:    4.47 cm/s LV PW:         2.00 cm     LV E/e' medial:  17.6 LV IVS:        1.60 cm     LV e' lateral:   6.05 cm/s LVOT diam:     1.60 cm     LV E/e' lateral: 13.0 LV SV:         58 LV SV Index:   33 LVOT Area:     2.01 cm  LV Volumes (MOD) LV vol d, MOD A2C: 82.9 ml LV vol d, MOD A4C: 86.8 ml LV vol s, MOD A2C: 22.6 ml LV vol s, MOD A4C: 23.2 ml LV SV MOD A2C:     60.3 ml LV SV MOD A4C:     86.8 ml LV SV MOD BP:      62.1 ml RIGHT VENTRICLE             IVC RV S prime:     11.10 cm/s  IVC diam: 2.40 cm TAPSE (M-mode): 1.8 cm LEFT ATRIUM             Index       RIGHT ATRIUM           Index LA diam:        3.90 cm 2.18 cm/m  RA Area:     18.60 cm LA Vol (A2C):   61.3 ml 34.24 ml/m RA Volume:   60.80 ml  33.96 ml/m LA Vol (A4C):   51.2 ml 28.60 ml/m LA Biplane Vol: 55.3 ml 30.89 ml/m  AORTIC VALVE LVOT Vmax:   117.00 cm/s LVOT Vmean:  81.100 cm/s LVOT VTI:    0.290 m  AORTA Ao Root diam: 3.20 cm Ao Asc diam:  2.70 cm MITRAL VALVE                TRICUSPID VALVE MV Area (PHT): 3.48 cm     TR Peak grad:   36.5 mmHg MV Decel Time: 218 msec     TR Vmax:        302.00 cm/s MV E velocity: 78.80 cm/s MV A velocity: 133.00 cm/s  SHUNTS MV E/A ratio:  0.59         Systemic VTI:   0.29 m                             Systemic Diam: 1.60 cm Dani Gobble Croitoru MD Electronically signed by Sanda Klein MD Signature Date/Time: 10/22/2020/3:12:57 PM    Final    Intravitreal Injection, Pharmacologic Agent - OD - Right Eye  Result Date: 10/03/2020 Time Out 10/03/2020. 10:24 AM. Confirmed correct patient, procedure, site, and patient consented. Anesthesia Topical anesthesia was used. Anesthetic medications included Lidocaine 2%, Proparacaine 0.5%. Procedure Preparation included 5% betadine to ocular surface, eyelid speculum. A (32g) needle was used. Injection: 2 mg aflibercept Alfonse Flavors) SOLN   NDC: A3590391, Lot: [id number], Expiration date: 12/14/2020   Route: Intravitreal, Site: Right Eye, Waste: 0.05 mL Post-op Post injection exam found visual acuity of at least counting fingers. The patient tolerated the procedure well. There were no complications. The patient received written and verbal post procedure care education. Post injection medications were not given. Notes An AC tap was performed following injection due to elevated IOP  using a 30 gauge needle on a syringe with the plunger removed. The needle was placed at the limbus at 7 oclock and approximately 0.08cc of aqueous was removed from the anterior chamber. Betadine was applied to the tap area before and after the paracentesis was performed. There were no complications. The patient tolerated the procedure well. The IOP was rechecked and was found to be ~8 mmHg by palpation.   Intravitreal Injection, Pharmacologic Agent - OS - Left Eye  Result Date: 10/03/2020 Time Out 10/03/2020. 10:25 AM. Confirmed correct patient, procedure, site, and patient consented. Anesthesia Topical anesthesia was used. Anesthetic medications included Lidocaine 2%, Proparacaine 0.5%. Procedure Preparation included 5% betadine to ocular surface, eyelid speculum. A (32g) needle was used. Injection: 2 mg aflibercept Alfonse Flavors) SOLN   NDC: A3590391, Lot: [id number],  Expiration date: 08/14/2021   Route: Intravitreal, Site: Left Eye, Waste: 0.05 mL Post-op Post injection exam found visual acuity of at least counting fingers. The patient tolerated the procedure well. There were no complications. The patient received written and verbal post procedure care education. Post injection medications were not given.   OCT, Retina - OU - Both Eyes  Result Date: 10/03/2020 Right Eye Quality was good. Central Foveal Thickness: 485. Progression has worsened. Findings include abnormal foveal contour, intraretinal fluid, intraretinal hyper-reflective material, no SRF, outer retinal atrophy, epiretinal membrane, macular pucker (Interval increase in central IRF). Left Eye Quality was good. Central Foveal Thickness: 855. Progression has improved. Findings include abnormal foveal contour, intraretinal fluid, epiretinal membrane, no SRF, preretinal fibrosis, vitreous traction (Interval improvement in subhyaloid heme and vitreous opacities, persistent traction). Notes *Images captured and stored on drive Diagnosis / Impression: DME OU Mild ERM OU OD: Interval increase in central IRF OS: Interval improvement in subhyaloid heme and vitreous opacities, persistent traction Clinical management: See below Abbreviations: NFP - Normal foveal profile. CME - cystoid macular edema. PED - pigment epithelial detachment. IRF - intraretinal fluid. SRF - subretinal fluid. EZ - ellipsoid zone. ERM - epiretinal membrane. ORA - outer retinal atrophy. ORT - outer retinal tubulation. SRHM - subretinal hyper-reflective material   VAS US CAROTID  Result Date: 10/23/2020 Carotid Arterial Duplex Study Indications:       CVA. Risk Factors:      Hypertension, Diabetes. Comparison Study:  No prior study Performing Technologist: Maudry Mayhew MHA, RDMS, RVT, RDCS  Examination Guidelines: A complete evaluation includes B-mode imaging, spectral Doppler, color Doppler, and power Doppler as needed of all accessible  portions of each vessel. Bilateral testing is considered an integral part of a complete examination. Limited examinations for reoccurring indications may be performed as noted.  Right Carotid Findings: +----------+--------+--------+--------+-------------------------+--------+           PSV cm/sEDV cm/sStenosisPlaque Description       Comments +----------+--------+--------+--------+-------------------------+--------+ CCA Prox  88      16              hyperechoic and irregular         +----------+--------+--------+--------+-------------------------+--------+ CCA Distal84      14              smooth and heterogenous           +----------+--------+--------+--------+-------------------------+--------+ ICA Prox  50      11              smooth and homogeneous            +----------+--------+--------+--------+-------------------------+--------+ ICA Distal76      23                                                +----------+--------+--------+--------+-------------------------+--------+  ECA       73      7                                                 +----------+--------+--------+--------+-------------------------+--------+ +----------+--------+-------+----------------+-------------------+           PSV cm/sEDV cmsDescribe        Arm Pressure (mmHG) +----------+--------+-------+----------------+-------------------+ [id number]            Multiphasic, WNL                    +----------+--------+-------+----------------+-------------------+ +---------+--------+--+--------+--+---------+ VertebralPSV cm/s39EDV cm/s14Antegrade +---------+--------+--+--------+--+---------+  Left Carotid Findings: +----------+--------+--------+--------+-----------------------+--------+           PSV cm/sEDV cm/sStenosisPlaque Description     Comments +----------+--------+--------+--------+-----------------------+--------+ CCA Prox  91      19                                               +----------+--------+--------+--------+-----------------------+--------+ CCA Distal82      18              smooth and heterogenous         +----------+--------+--------+--------+-----------------------+--------+ ICA Prox  62      12              heterogenous                    +----------+--------+--------+--------+-----------------------+--------+ ICA Distal61      20                                              +----------+--------+--------+--------+-----------------------+--------+ ECA       98      12                                              +----------+--------+--------+--------+-----------------------+--------+ +----------+--------+--------+----------------+-------------------+           PSV cm/sEDV cm/sDescribe        Arm Pressure (mmHG) +----------+--------+--------+----------------+-------------------+ [id number]             Multiphasic, WNL                    +----------+--------+--------+----------------+-------------------+ +---------+--------+--+--------+--+---------+ VertebralPSV cm/s81EDV cm/s19Antegrade +---------+--------+--+--------+--+---------+   Summary: Right Carotid: Velocities in the right ICA are consistent with a 1-39% stenosis. Left Carotid: Velocities in the left ICA are consistent with a 1-39% stenosis. Vertebrals:  Bilateral vertebral arteries demonstrate antegrade flow. Subclavians: Normal flow hemodynamics were seen in bilateral subclavian              arteries. *See table(s) above for measurements and observations.  Electronically signed by Antony Contras MD on 10/23/2020 at 1:31:21 PM.    Final    VAS [place] TRANSCRANIAL DOPPLER  Result Date: 10/23/2020  Transcranial Doppler Indications: Stroke. History: Hypertension, diabetes mellitus. Limitations: Suboptimal acoustic windows Comparison Study: No prior study Performing Technologist: Darlin Coco, RDMS Supporting Technologist: Maudry Mayhew MHA, RDMS, RVT,  RDCS  Examination Guidelines: A complete evaluation includes B-mode imaging,  spectral Doppler, color Doppler, and power Doppler as needed of all accessible portions of each vessel. Bilateral testing is considered an integral part of a complete examination. Limited examinations for reoccurring indications may be performed as noted.  +----------+-------------+----------+-----------+-------+ RIGHT TCD Right VM (cm)Depth (cm)PulsatilityComment +----------+-------------+----------+-----------+-------+ MCA           85.00                 1.34            +----------+-------------+----------+-----------+-------+ ACA          -28.00                 1.17            +----------+-------------+----------+-----------+-------+ Term ICA      28.00                 1.43            +----------+-------------+----------+-----------+-------+ PCA           32.00                 1.32            +----------+-------------+----------+-----------+-------+ Opthalmic     15.00                 1.56            +----------+-------------+----------+-----------+-------+ ICA siphon    14.00                 1.58            +----------+-------------+----------+-----------+-------+ Vertebral    -20.00                 1.34            +----------+-------------+----------+-----------+-------+  +----------+------------+----------+-----------+------------------+ LEFT TCD  Left VM (cm)Depth (cm)Pulsatility     Comment       +----------+------------+----------+-----------+------------------+ MCA          73.00                 1.26                       +----------+------------+----------+-----------+------------------+ ACA                                        Unable to insonate +----------+------------+----------+-----------+------------------+ Term ICA     38.00                 1.31                       +----------+------------+----------+-----------+------------------+ PCA           46.00                 1.59                       +----------+------------+----------+-----------+------------------+ Opthalmic    18.00                 1.90                       +----------+------------+----------+-----------+------------------+ ICA siphon   45.00                 1.27                       +----------+------------+----------+-----------+------------------+  Vertebral    -30.00                1.42                       +----------+------------+----------+-----------+------------------+  +------------+-------+------------------+             VM cm/s     Comment       +------------+-------+------------------+ Prox Basilar       Unable to insonate +------------+-------+------------------+ Summary:  Elevated bilateral middle cerebral arteries of unclear significance. Globally elevate dpulsatility indices suggest diffuse intracranial atherosclerosis. *See table(s) above for TCD measurements and observations.  Diagnosing physician: Antony Contras MD Electronically signed by Antony Contras MD on 10/23/2020 at 1:32:56 PM.    Final      TODAY-DAY OF DISCHARGE:  Subjective:   [place] Co[place] today has no headache,no chest abdominal pain,no new weakness tingling or numbness, feels much better wants to go home today.   Objective:   Blood pressure (!) 160/71, pulse 60, temperature 98.6 F (37 C), temperature source Oral, resp. rate 17, height 5' 7"  (1.702 m), weight 68.1 kg, SpO2 100 %.  Intake/Output Summary (Last 24 hours) at 10/25/2020 0907 Last data filed at 10/25/2020 0104 Gross per 24 hour  Intake 513 ml  Output 900 ml  Net -387 ml   Filed Weights   10/18/20 2325 10/19/20 1106  Weight: 70.3 kg 68.1 kg    Exam: Awake Alert, Oriented *3, No new F.N deficits, Normal affect [place].AT,PERRAL Supple Neck,No JVD, No cervical lymphadenopathy appriciated.  Symmetrical Chest wall movement, Good air movement bilaterally, CTAB RRR,No Gallops,Rubs or new Murmurs, No  Parasternal Heave +ve B.Sounds, Abd Soft, Non tender, No organomegaly appriciated, No rebound -guarding or rigidity. No Cyanosis, Clubbing or edema, No new Rash or bruise   PERTINENT RADIOLOGIC STUDIES: VAS US CAROTID  Result Date: 10/23/2020 Carotid Arterial Duplex Study Indications:       CVA. Risk Factors:      Hypertension, Diabetes. Comparison Study:  No prior study Performing Technologist: Maudry Mayhew MHA, RDMS, RVT, RDCS  Examination Guidelines: A complete evaluation includes B-mode imaging, spectral Doppler, color Doppler, and power Doppler as needed of all accessible portions of each vessel. Bilateral testing is considered an integral part of a complete examination. Limited examinations for reoccurring indications may be performed as noted.  Right Carotid Findings: +----------+--------+--------+--------+-------------------------+--------+           PSV cm/sEDV cm/sStenosisPlaque Description       Comments +----------+--------+--------+--------+-------------------------+--------+ CCA Prox  88      16              hyperechoic and irregular         +----------+--------+--------+--------+-------------------------+--------+ CCA Distal84      14              smooth and heterogenous           +----------+--------+--------+--------+-------------------------+--------+ ICA Prox  50      11              smooth and homogeneous            +----------+--------+--------+--------+-------------------------+--------+ ICA Distal76      23                                                +----------+--------+--------+--------+-------------------------+--------+ ECA  73      7                                                 +----------+--------+--------+--------+-------------------------+--------+ +----------+--------+-------+----------------+-------------------+           PSV cm/sEDV cmsDescribe        Arm Pressure (mmHG)  +----------+--------+-------+----------------+-------------------+ [id number]            Multiphasic, WNL                    +----------+--------+-------+----------------+-------------------+ +---------+--------+--+--------+--+---------+ VertebralPSV cm/s39EDV cm/s14Antegrade +---------+--------+--+--------+--+---------+  Left Carotid Findings: +----------+--------+--------+--------+-----------------------+--------+           PSV cm/sEDV cm/sStenosisPlaque Description     Comments +----------+--------+--------+--------+-----------------------+--------+ CCA Prox  91      19                                              +----------+--------+--------+--------+-----------------------+--------+ CCA Distal82      18              smooth and heterogenous         +----------+--------+--------+--------+-----------------------+--------+ ICA Prox  62      12              heterogenous                    +----------+--------+--------+--------+-----------------------+--------+ ICA Distal61      20                                              +----------+--------+--------+--------+-----------------------+--------+ ECA       98      12                                              +----------+--------+--------+--------+-----------------------+--------+ +----------+--------+--------+----------------+-------------------+           PSV cm/sEDV cm/sDescribe        Arm Pressure (mmHG) +----------+--------+--------+----------------+-------------------+ [id number]             Multiphasic, WNL                    +----------+--------+--------+----------------+-------------------+ +---------+--------+--+--------+--+---------+ VertebralPSV cm/s81EDV cm/s19Antegrade +---------+--------+--+--------+--+---------+   Summary: Right Carotid: Velocities in the right ICA are consistent with a 1-39% stenosis. Left Carotid: Velocities in the left ICA are consistent with a  1-39% stenosis. Vertebrals:  Bilateral vertebral arteries demonstrate antegrade flow. Subclavians: Normal flow hemodynamics were seen in bilateral subclavian              arteries. *See table(s) above for measurements and observations.  Electronically signed by Antony Contras MD on 10/23/2020 at 1:31:21 PM.    Final    VAS [place] TRANSCRANIAL DOPPLER  Result Date: 10/23/2020  Transcranial Doppler Indications: Stroke. History: Hypertension, diabetes mellitus. Limitations: Suboptimal acoustic windows Comparison Study: No prior study Performing Technologist: Darlin Coco, RDMS Supporting Technologist: Maudry Mayhew MHA, RDMS, RVT, RDCS  Examination Guidelines: A complete evaluation includes B-mode imaging, spectral Doppler, color Doppler, and power Doppler  as needed of all accessible portions of each vessel. Bilateral testing is considered an integral part of a complete examination. Limited examinations for reoccurring indications may be performed as noted.  +----------+-------------+----------+-----------+-------+ RIGHT TCD Right VM (cm)Depth (cm)PulsatilityComment +----------+-------------+----------+-----------+-------+ MCA           85.00                 1.34            +----------+-------------+----------+-----------+-------+ ACA          -28.00                 1.17            +----------+-------------+----------+-----------+-------+ Term ICA      28.00                 1.43            +----------+-------------+----------+-----------+-------+ PCA           32.00                 1.32            +----------+-------------+----------+-----------+-------+ Opthalmic     15.00                 1.56            +----------+-------------+----------+-----------+-------+ ICA siphon    14.00                 1.58            +----------+-------------+----------+-----------+-------+ Vertebral    -20.00                 1.34             +----------+-------------+----------+-----------+-------+  +----------+------------+----------+-----------+------------------+ LEFT TCD  Left VM (cm)Depth (cm)Pulsatility     Comment       +----------+------------+----------+-----------+------------------+ MCA          73.00                 1.26                       +----------+------------+----------+-----------+------------------+ ACA                                        Unable to insonate +----------+------------+----------+-----------+------------------+ Term ICA     38.00                 1.31                       +----------+------------+----------+-----------+------------------+ PCA          46.00                 1.59                       +----------+------------+----------+-----------+------------------+ Opthalmic    18.00                 1.90                       +----------+------------+----------+-----------+------------------+ ICA siphon   45.00                 1.27                       +----------+------------+----------+-----------+------------------+  Vertebral    -30.00                1.42                       +----------+------------+----------+-----------+------------------+  +------------+-------+------------------+             VM cm/s     Comment       +------------+-------+------------------+ Prox Basilar       Unable to insonate +------------+-------+------------------+ Summary:  Elevated bilateral middle cerebral arteries of unclear significance. Globally elevate dpulsatility indices suggest diffuse intracranial atherosclerosis. *See table(s) above for TCD measurements and observations.  Diagnosing physician: Antony Contras MD Electronically signed by Antony Contras MD on 10/23/2020 at 1:32:56 PM.    Final      PERTINENT LAB RESULTS: CBC: Recent Labs    10/22/20 0918  WBC 4.4  HGB 8.8*  HCT 28.6*  PLT 153   CMET CMP     Component Value Date/Time   NA 145 10/22/2020  0918   NA 143 08/07/2020 1559   K 4.1 10/22/2020 0918   CL 118 (H) 10/22/2020 0918   CO2 19 (L) 10/22/2020 0918   GLUCOSE 152 (H) 10/22/2020 0918   BUN 28 (H) 10/22/2020 0918   BUN 27 08/07/2020 1559   CREATININE 2.82 (H) 10/22/2020 0918   CALCIUM 8.9 10/22/2020 0918   PROT 7.8 08/07/2020 1559   ALBUMIN 4.4 08/07/2020 1559   AST 15 08/07/2020 1559   ALT 10 08/07/2020 1559   ALKPHOS 109 08/07/2020 1559   BILITOT 0.2 08/07/2020 1559   GFRNONAA 17 (L) 10/22/2020 0918   GFRAA 20 (L) 08/07/2020 1559    GFR Estimated Creatinine Clearance: 16.5 mL/min (A) (by C-G formula based on SCr of 2.82 mg/dL (H)). No results for input(s): LIPASE, AMYLASE in the last 72 hours. No results for input(s): CKTOTAL, CKMB, CKMBINDEX, TROPONINI in the last 72 hours. Invalid input(s): POCBNP No results for input(s): DDIMER in the last 72 hours. Recent Labs    10/23/20 0348  HGBA1C 6.1*   Recent Labs    10/23/20 0348  CHOL 107  HDL 32*  LDLCALC 61  TRIG 72  CHOLHDL 3.3   No results for input(s): TSH, T4TOTAL, T3FREE, THYROIDAB in the last 72 hours.  Invalid input(s): FREET3 No results for input(s): VITAMINB12, FOLATE, FERRITIN, TIBC, IRON, RETICCTPCT in the last 72 hours. Coags: No results for input(s): INR in the last 72 hours.  Invalid input(s): PT Microbiology: Recent Results (from the past 240 hour(s))  Respiratory Panel by RT PCR (Flu A&B, Covid) - Nasopharyngeal Swab     Status: None   Collection Time: 10/19/20  5:40 AM   Specimen: Nasopharyngeal Swab  Result Value Ref Range Status   SARS Coronavirus 2 by RT PCR NEGATIVE NEGATIVE Final    Comment: (NOTE) SARS-CoV-2 target nucleic acids are NOT DETECTED.  The SARS-CoV-2 RNA is generally detectable in upper respiratoy specimens during the acute phase of infection. The lowest concentration of SARS-CoV-2 viral copies this assay can detect is 131 copies/mL. A negative result does not preclude SARS-Cov-2 infection and should not be  used as the sole basis for treatment or other patient management decisions. A negative result may occur with  improper specimen collection/handling, submission of specimen other than nasopharyngeal swab, presence of viral mutation(s) within the areas targeted by this assay, and inadequate number of viral copies (<131 copies/mL). A negative result must be combined with clinical observations, patient history, and epidemiological information.  The expected result is Negative.  Fact Sheet for Patients:  PinkCheek.be  Fact Sheet for Healthcare Providers:  GravelBags.it  This test is no t yet approved or cleared by the [place] FDA and  has been authorized for detection and/or diagnosis of SARS-CoV-2 by FDA under an Emergency Use Authorization (EUA). This EUA will remain  in effect (meaning this test can be used) for the duration of the COVID-19 declaration under Section 564(b)(1) of the Act, 21 U.S.C. section 360bbb-3(b)(1), unless the authorization is terminated or revoked sooner.     Influenza A by PCR NEGATIVE NEGATIVE Final   Influenza B by PCR NEGATIVE NEGATIVE Final    Comment: (NOTE) The Xpert Xpress SARS-CoV-2/FLU/RSV assay is intended as an aid in  the diagnosis of influenza from Nasopharyngeal swab specimens and  should not be used as a sole basis for treatment. Nasal washings and  aspirates are unacceptable for Xpert Xpress SARS-CoV-2/FLU/RSV  testing.  Fact Sheet for Patients: PinkCheek.be  Fact Sheet for Healthcare Providers: GravelBags.it  This test is not yet approved or cleared by the [place] FDA and  has been authorized for detection and/or diagnosis of SARS-CoV-2 by  FDA under an Emergency Use Authorization (EUA). This EUA will remain  in effect (meaning this test can be used) for the duration of the  Covid-19 declaration under Section  564(b)(1) of the Act, 21  U.S.C. section 360bbb-3(b)(1), unless the authorization is  terminated or revoked. Performed at [facility], [place] [address] [place], [place] [address]     FURTHER DISCHARGE INSTRUCTIONS:  Get Medicines reviewed and adjusted: Please take all your medications with you for your next visit with your Primary MD  Laboratory/radiological data: Please request your Primary MD to go over all hospital tests and procedure/radiological results at the follow up, please ask your Primary MD to get all Hospital records sent to his/her office.  In some cases, they will be blood work, cultures and biopsy results pending at the time of your discharge. Please request that your primary care M.D. goes through all the records of your hospital data and follows up on these results.  Also Note the following: If you experience worsening of your admission symptoms, develop shortness of breath, life threatening emergency, suicidal or homicidal thoughts you must seek medical attention immediately by calling 911 or calling your MD immediately  if symptoms less severe.  You must read complete instructions/literature along with all the possible adverse reactions/side effects for all the Medicines you take and that have been prescribed to you. Take any new Medicines after you have completely understood and accpet all the possible adverse reactions/side effects.   Do not drive when taking Pain medications or sleeping medications (Benzodaizepines)  Do not take more than prescribed Pain, Sleep and Anxiety Medications. It is not advisable to combine anxiety,sleep and pain medications without talking with your primary care practitioner  Special Instructions: If you have smoked or chewed Tobacco  in the last 2 yrs please stop smoking, stop any regular Alcohol  and or any Recreational drug use.  Wear Seat belts while driving.  Please note: You were cared for by a hospitalist during your  hospital stay. Once you are discharged, your primary care physician will handle any further medical issues. Please note that NO REFILLS for any discharge medications will be authorized once you are discharged, as it is imperative that you return to your primary care physician (or establish a relationship with a primary care physician if  you do not have one) for your post hospital discharge needs so that they can reassess your need for medications and monitor your lab values.  Total Time spent coordinating discharge including counseling, education and face to face time equals 35 minutes.  SignedOren Binet 10/25/2020 9:07 AM

## 2020-10-24 NOTE — TOC Progression Note (Addendum)
Transition of Care [facility]) - Progression Note    Patient Details  Name: Desiree Pollard MRN: [id number] Date of Birth: Aug 20, 1946  [facility] CM/SW [place], [place] Phone Number: 10/24/2020, 10:39 AM  Clinical Narrative:   CSW spoke with patient's sister, [name], via phone to discuss bed offers. Sister asked about the one that had the highest rating, and chose [place]. CSW contacted Caren Griffins in Admissions at [facility] to ask her to initiate insurance authorization through [employer]. Patient will need an updated therapy note to send to insurance for approval, and CSW asked PT to see. CSW to follow.  UPDATE: Sent updated PT note to [place] to send to [facility] for insurance authorization request.    Expected Discharge Plan: Skilled Nursing Facility Barriers to Discharge: Insurance Authorization  Expected Discharge Plan and Services Expected Discharge Plan: [place] arrangements for the past 2 months: Single Family Home Expected Discharge Date: 10/24/20                                     Social Determinants of Health (SDOH) Interventions    Readmission Risk Interventions No flowsheet data found.

## 2020-10-24 NOTE — Telephone Encounter (Signed)
ok 

## 2020-10-24 NOTE — Telephone Encounter (Signed)
Spoke to patients sister,  Rona Ravens she stated pt was in the hospital and was going to need therpy afterwards.  They will reschedule appointment when pt is recovered.

## 2020-10-24 NOTE — Progress Notes (Signed)
Physical Therapy Treatment Patient Details Name: Desiree Pollard MRN: [id number] DOB: [birth year] Today's Date: 10/24/2020    History of Present Illness Desiree Pollard is a 74 y.o. female with history of chronic kidney disease stage IV baseline creatinine around 2.5, complete heart block status post pacemaker placement, history of breast cancer in remission, diabetes mellitus type 2, hypertension, chronic anemia admitted due to experiencing dizziness.  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  Patient blood work also shows new thrombocytopenia with mild worsening of anemia from usual around 10 to 8.    PT Comments    Pt continues to demonstrate deficits in LE strength, coordination, and balance which limit her independence and safety with all functional mobility. She required extra time and modA to transition supine <> sit EOB and  modAx2 for stand pivot transfers and for short gait bouts with a RW. She does not shift her weight and maintain stance phase long enough to allow for effective LE advancement bilaterally, with minimal correction noted when provided verbal or tactile cues/assistance. She also fatigues quickly. However, she was able to maintain static standing balance while receiving perturbations with only modAx1 with B UE support on the RW for several minutes. Will continue to follow acutely and recommend SNF upon d/c to address her deficits to maximize her independence and safety with functional mobility.   Follow Up Recommendations  SNF;Supervision/Assistance - 24 hour     Equipment Recommendations  None recommended by PT    Recommendations for Other Services       Precautions / Restrictions Precautions Precautions: Fall Restrictions Weight Bearing Restrictions: No    Mobility  Bed Mobility Overal bed mobility: Needs Assistance Bed Mobility: Supine to Sit;Sit to Supine     Supine to sit: HOB elevated;Mod  assist Sit to supine: Mod assist   General bed mobility comments: Cues provided to manage LEs off EOB and to utilize bed rail to manage trunk.  Transfers Overall transfer level: Needs assistance Equipment used: Rolling walker (2 wheeled) Transfers: Sit to/from [facility] Sit to Stand: Min assist;Mod assist;+2 physical assistance Stand pivot transfers: Mod assist;+2 physical assistance       General transfer comment: MinAx1 and modAx1 to come to stand with cues for hand placement and then sequencing transition of hands to RW. ModAx2 for stand pivot to L to manage RW and sequence steps.  Ambulation/Gait Ambulation/Gait assistance: +2 safety/equipment;Mod assist Gait Distance (Feet): 5 Feet (x1 bout) Assistive device: Rolling walker (2 wheeled) Gait Pattern/deviations: Shuffle;Decreased stride length;Trunk flexed;Decreased stance time - right;Decreased stance time - left;Narrow base of support Gait velocity: decreased Gait velocity interpretation: <1.31 ft/sec, indicative of household ambulator General Gait Details: Pt shuffles and takes very short steps with trunk flexed. Provided VCs and TCs at B LEs to inc weight shift and stance time bilaterally to allow for inc step length, min success.   Stairs             Wheelchair Mobility    Modified Rankin (Stroke Patients Only) Modified Rankin (Stroke Patients Only) Pre-Morbid Rankin Score: Moderate disability Modified Rankin: Moderately severe disability     Balance Overall balance assessment: Needs assistance Sitting-balance support: Feet supported;No upper extremity supported Sitting balance-Leahy Scale: Fair Sitting balance - Comments: Static sitting EOB without B UE support and min guard assist.   Standing balance support: Bilateral upper extremity supported Standing balance-Leahy Scale: Poor Standing balance comment: Static standing x1 bout of ~3 min  while receiving perturbations at trunk and VCs  provided to improve upright posture, with momentary success. ModAx1 to maintain balance.                            Cognition Arousal/Alertness: Awake/alert Behavior During Therapy: WFL for tasks assessed/performed Overall Cognitive Status: Impaired/Different from baseline Area of Impairment: Attention;Safety/judgement;Problem solving;Following commands                   Current Attention Level: Sustained   Following Commands: Follows one step commands consistently;Follows one step commands with increased time Safety/Judgement: Decreased awareness of safety;Decreased awareness of deficits   Problem Solving: Slow processing;Decreased initiation;Difficulty sequencing;Requires verbal cues General Comments: noted slowed responses but able to follow commands. Pt unaware of having urinated or having bowel movement.      Exercises      General Comments        Pertinent Vitals/Pain Pain Assessment: No/denies pain    Home Living                      Prior Function            PT Goals (current goals can now be found in the care plan section) Acute Rehab PT Goals Patient Stated Goal: to get more indep.  PT Goal Formulation: With patient Time For Goal Achievement: 11/03/20 Potential to Achieve Goals: Good Progress towards PT goals: Progressing toward goals    Frequency    Min 3X/week      PT Plan Current plan remains appropriate    Co-evaluation              AM-PAC PT "6 Clicks" Mobility   Outcome Measure  Help needed turning from your back to your side while in a flat bed without using bedrails?: A Little Help needed moving from lying on your back to sitting on the side of a flat bed without using bedrails?: A Lot Help needed moving to and from a bed to a chair (including a wheelchair)?: A Lot Help needed standing up from a chair using your arms (e.g., wheelchair or bedside chair)?: A Lot Help needed to walk in hospital room?: A  Lot Help needed climbing 3-5 steps with a railing? : Total 6 Click Score: 12    End of Session Equipment Utilized During Treatment: Gait belt Activity Tolerance: Patient tolerated treatment well Patient left: in bed;with call bell/phone within reach;with bed alarm set Nurse Communication: Mobility status;Other (comment) (incontinence and unaware of it) PT Visit Diagnosis: Other abnormalities of gait and mobility (R26.89);Muscle weakness (generalized) (M62.81);Dizziness and giddiness (R42);Unsteadiness on feet (R26.81);Difficulty in walking, not elsewhere classified (R26.2)     Time: 2585-2778 PT Time Calculation (min) (ACUTE ONLY): 31 min  Charges:  $Gait Training: 8-22 mins $Therapeutic Activity: 8-22 mins                     Moishe Spice, PT, DPT Acute Rehabilitation Services  Pager: [phone] Office: [place] 10/24/2020, 1:25 PM

## 2020-10-25 LAB — GLUCOSE, CAPILLARY
Glucose-Capillary: 92 mg/dL (ref 70–99)
Glucose-Capillary: 133 mg/dL — ABNORMAL HIGH (ref 70–99)
Glucose-Capillary: 116 mg/dL — ABNORMAL HIGH (ref 70–99)
Glucose-Capillary: 210 mg/dL — ABNORMAL HIGH (ref 70–99)

## 2020-10-25 LAB — URINALYSIS, ROUTINE W REFLEX MICROSCOPIC
Bilirubin Urine: NEGATIVE
Glucose, UA: 50 mg/dL — AB
Ketones, ur: NEGATIVE mg/dL
Nitrite: NEGATIVE
Protein, ur: >=300 mg/dL — AB
Specific Gravity, Urine: 1.013 (ref 1.005–1.030)
WBC, UA: >50 WBC/hpf — ABNORMAL HIGH (ref 0–5)
pH: 5 (ref 5.0–8.0)

## 2020-10-25 LAB — RESPIRATORY PANEL BY RT PCR (FLU A&B, COVID)
Influenza A by PCR: NEGATIVE
Influenza B by PCR: NEGATIVE
SARS Coronavirus 2 by RT PCR: NEGATIVE

## 2020-10-25 NOTE — TOC Progression Note (Addendum)
Transition of Care [facility]) - Progression Note    Patient Details  Name: Desiree Pollard MRN: [id number] Date of Birth: [birth year]  Transition of Care [facility]) CM/SW [place], [place] Phone Number: 10/25/2020, 3:18 PM  Clinical Narrative:   CSW contacted [name] to ask about insurance approval and insurance authorization has been received. CSW worked towards discharge today, but then RN notified CSW that patient was having medical issues. Discharge on hold today. CSW notified [name], asking if they can take her over the weekend. CSW to follow.  UPDATE 4:16: CSW spoke with [place] and they will not have weekend staff for admission, so patient cannot admit to SNF until Monday. CSW to follow.    Expected Discharge Plan: [place] Barriers to Discharge: Continued Medical Work up  Expected Discharge Plan and Services Expected Discharge Plan: [place] arrangements for the past 2 months: Single Family Home Expected Discharge Date: 10/24/20                                     Social Determinants of Health (SDOH) Interventions    Readmission Risk Interventions No flowsheet data found.

## 2020-10-25 NOTE — Progress Notes (Signed)
Dr Sloan Leiter notified of dark bloody urine and gave order to hold heparin, Asprin and Plavix. Discharge to be held for now until he further assess. Pt is unsystematic no weakness or SOB and she has no pain.

## 2020-10-25 NOTE — Progress Notes (Signed)
Patient was seen and examined-nursing staff at bedside.  No major issues overnight.  No change in physical exam.  Remains stable for discharge to SNF when bed available.  Discharge summary updated-medications remain up to date.

## 2020-10-25 NOTE — Progress Notes (Signed)
Occupational Therapy Treatment Patient Details Name: Twinkle Sockwell MRN: [id number] DOB: [birth year] Today's Date: 10/25/2020    History of present illness [place] Jenson is a 74 y.o. female with history of chronic kidney disease stage IV baseline creatinine around 2.5, complete heart block status post pacemaker placement, history of breast cancer in remission, diabetes mellitus type 2, hypertension, chronic anemia admitted due to experiencing dizziness.  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  Patient blood work also shows new thrombocytopenia with mild worsening of anemia from usual around 10 to 8.   OT comments  Pt. Seen for skilled OT treatment session.  Able to complete grooming and LB dressing task seated with min guard a.  Some cues required for safety.  Pt. Tolerated well but did report feeling tired/weak.    Follow Up Recommendations  SNF;Supervision/Assistance - 24 hour    Equipment Recommendations       Recommendations for Other Services      Precautions / Restrictions Precautions Precautions: Fall       Mobility Bed Mobility                  Transfers                      Balance                                           ADL either performed or assessed with clinical judgement   ADL Overall ADL's : Needs assistance/impaired     Grooming: Wash/dry face;Brushing hair;Min guard;Sitting               Lower Body Dressing: Minimal assistance;Sitting/lateral leans Lower Body Dressing Details (indicate cue type and reason): pt able to adjust socks from recliner with supervision-cues to cross each leg over knee to reach socks vs. bending forward.  educated that this could be a fall risk               General ADL Comments: pt. able to wash face but required assistance for thoroughness around B eyes.  cues to cross legs over knees for lb dressing for safety.   pt. able to complete grooming tasks and lb dressing but reports feeling weak     Vision       Perception     Praxis      Cognition Arousal/Alertness: Awake/alert Behavior During Therapy: Flat affect                                            Exercises     Shoulder Instructions       General Comments      Pertinent Vitals/ Pain       Pain Assessment: No/denies pain  Home Living                                          Prior Functioning/Environment              Frequency  Min 2X/week        Progress Toward Goals  OT Goals(current goals can now be found  in the care plan section)  Progress towards OT goals: Progressing toward goals     Plan Discharge plan remains appropriate;Frequency remains appropriate    Co-evaluation                 AM-PAC OT "6 Clicks" Daily Activity     Outcome Measure   Help from another person eating meals?: None Help from another person taking care of personal grooming?: A Little Help from another person toileting, which includes using toliet, bedpan, or urinal?: A Lot Help from another person bathing (including washing, rinsing, drying)?: A Lot Help from another person to put on and taking off regular upper body clothing?: A Little Help from another person to put on and taking off regular lower body clothing?: A Lot 6 Click Score: 16    End of Session    OT Visit Diagnosis: Unsteadiness on feet (R26.81);Muscle weakness (generalized) (M62.81);Low vision, both eyes (H54.2);Other symptoms and signs involving cognitive function   Activity Tolerance Patient tolerated treatment well   Patient Left in chair;with call bell/phone within reach;with chair alarm set   Nurse Communication Other (comment) (spoke with CNA that pt. requesting ice water, he states no fluid restrictions and able to provide her the water)        Time: 1147-1200 OT Time Calculation (min): 13 min  Charges:  OT General Charges $OT Visit: 1 Visit OT Treatments $Self Care/Home Management : 8-22 mins  [name], COTA/L Acute Rehabilitation [phone]   Janice Coffin 10/25/2020, 1:19 PM

## 2020-10-25 NOTE — Progress Notes (Signed)
Informed by RN-patient now has hematuria-confirmed on my evaluation as well-dark red urine in pure wick canister.  Stop aspirin/Plavix/SQ heparin-check UA-check bladder scan.  Hold discharge-reassess tomorrow.

## 2020-10-25 NOTE — Progress Notes (Signed)
Physical Therapy Treatment Patient Details Name: Desiree Pollard MRN: [id number] DOB: 12-01-1946 Today's Date: 10/25/2020    History of Present Illness Desiree Pollard is a 74 y.o. female with history of chronic kidney disease stage IV baseline creatinine around 2.5, complete heart block status post pacemaker placement, history of breast cancer in remission, diabetes mellitus type 2, hypertension, chronic anemia admitted due to experiencing dizziness.  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  Patient blood work also shows new thrombocytopenia with mild worsening of anemia from usual around 10 to 8.    PT Comments    Focused today's session on functional mobility training, primarily with transfers. Provided step-by-step cues for transitioning supine to sit with modA and to come to stand. Cued pt to scoot anteriorly, place feet posteriorly and shoulder width apart, maintain hand contact with current seated surface until buttocks is cleared, and rock anteriorly to gain momentum gaining nose over toes to transfer sit to stand. Good carryover was noted as faded feedback was provided to encourage learning. Noted pt required decreased assistance with subsequent reps, but still required modA overall. Will continue to follow acutely and recommend SNF upon d/c to address her deficits to maximize her independence and safety with all functional mobility.    Follow Up Recommendations  SNF;Supervision/Assistance - 24 hour     Equipment Recommendations  None recommended by PT    Recommendations for Other Services       Precautions / Restrictions Precautions Precautions: Fall Restrictions Weight Bearing Restrictions: No    Mobility  Bed Mobility Overal bed mobility: Needs Assistance Bed Mobility: Supine to Sit     Supine to sit: Mod assist     General bed mobility comments: Bed flat, cuing pt for B hand placement on R bed rail to  assist with pulling trunk, then step-by-step verbal and tactile cues provided to sequence to R elbow> forearm> hand to ascend trunk. ModA to manage trunk and LEs and square hips with EOB.  Transfers Overall transfer level: Needs assistance Equipment used: Rolling walker (2 wheeled) Transfers: Sit to/from [facility] Sit to Stand: Mod assist Stand pivot transfers: Mod assist       General transfer comment: Educated pt on proper seuqnece of transfers, scoot anteriorly then place feet posteriorly and shoulder-width apart, hands on current sitting surface, then rocking nose over toes to gain momentum to stand. Good carryover noted with subsequent reps, 5x sit to stand reps and 1x stand pivot to R. ModA to power up to stand.  Ambulation/Gait                 Stairs             Wheelchair Mobility    Modified Rankin (Stroke Patients Only) Modified Rankin (Stroke Patients Only) Pre-Morbid Rankin Score: Moderate disability Modified Rankin: Moderately severe disability     Balance Overall balance assessment: Needs assistance Sitting-balance support: Feet supported;No upper extremity supported Sitting balance-Leahy Scale: Fair Sitting balance - Comments: Static sitting EOB without B UE support and min guard assist.   Standing balance support: Bilateral upper extremity supported Standing balance-Leahy Scale: Poor Standing balance comment: Static standing x4 bouts of ~30-90 sec duration with B UE on RW and cues to extend hips and bring chest superior to improve posture, with mod success. Min-modA to maintain balance.  Cognition Arousal/Alertness: Awake/alert Behavior During Therapy: WFL for tasks assessed/performed Overall Cognitive Status: Impaired/Different from baseline Area of Impairment: Safety/judgement;Problem solving;Following commands                       Following Commands: Follows one step commands  consistently;Follows one step commands with increased time;Follows multi-step commands consistently;Follows multi-step commands with increased time Safety/Judgement: Decreased awareness of safety;Decreased awareness of deficits   Problem Solving: Slow processing;Decreased initiation;Difficulty sequencing;Requires verbal cues General Comments: A&Ox4. Pt required repeated cues and extra time to process and respond to cues to initiate tasks.      Exercises      General Comments General comments (skin integrity, edema, etc.): Blood noted on pure wick, notified nurse      Pertinent Vitals/Pain Pain Assessment: No/denies pain (denied originally, but then reported pain in posterior L LE)    Home Living                      Prior Function            PT Goals (current goals can now be found in the care plan section) Acute Rehab PT Goals Patient Stated Goal: to get more indep.  PT Goal Formulation: With patient Time For Goal Achievement: 11/03/20 Potential to Achieve Goals: Good Progress towards PT goals: Progressing toward goals    Frequency    Min 3X/week      PT Plan Current plan remains appropriate    Co-evaluation              AM-PAC PT "6 Clicks" Mobility   Outcome Measure  Help needed turning from your back to your side while in a flat bed without using bedrails?: A Little Help needed moving from lying on your back to sitting on the side of a flat bed without using bedrails?: A Lot Help needed moving to and from a bed to a chair (including a wheelchair)?: A Lot Help needed standing up from a chair using your arms (e.g., wheelchair or bedside chair)?: A Lot Help needed to walk in hospital room?: A Lot Help needed climbing 3-5 steps with a railing? : Total 6 Click Score: 12    End of Session Equipment Utilized During Treatment: Gait belt Activity Tolerance: Patient tolerated treatment well Patient left: in chair;with call bell/phone within reach;with  chair alarm set Nurse Communication: Mobility status;Other (comment) (blood on pure wick) PT Visit Diagnosis: Other abnormalities of gait and mobility (R26.89);Muscle weakness (generalized) (M62.81);Dizziness and giddiness (R42);Unsteadiness on feet (R26.81);Difficulty in walking, not elsewhere classified (R26.2)     Time: 6195-0932 PT Time Calculation (min) (ACUTE ONLY): 23 min  Charges:  $Therapeutic Activity: 23-37 mins                     Moishe Spice, PT, DPT Acute Rehabilitation Services  Pager: [phone] Office: [place] 10/25/2020, 9:48 AM

## 2020-10-26 DIAGNOSIS — D696 Thrombocytopenia, unspecified: Secondary | ICD-10-CM | POA: Diagnosis not present

## 2020-10-26 DIAGNOSIS — N179 Acute kidney failure, unspecified: Secondary | ICD-10-CM | POA: Diagnosis not present

## 2020-10-26 DIAGNOSIS — I10 Essential (primary) hypertension: Secondary | ICD-10-CM | POA: Diagnosis not present

## 2020-10-26 LAB — GLUCOSE, CAPILLARY
Glucose-Capillary: 97 mg/dL (ref 70–99)
Glucose-Capillary: 113 mg/dL — ABNORMAL HIGH (ref 70–99)
Glucose-Capillary: 123 mg/dL — ABNORMAL HIGH (ref 70–99)
Glucose-Capillary: 146 mg/dL — ABNORMAL HIGH (ref 70–99)

## 2020-10-26 LAB — CBC
HCT: 24.8 % — ABNORMAL LOW (ref 36.0–46.0)
Hemoglobin: 7.7 g/dL — ABNORMAL LOW (ref 12.0–15.0)
MCH: 28.4 pg (ref 26.0–34.0)
MCHC: 31 g/dL (ref 30.0–36.0)
MCV: 91.5 fL (ref 80.0–100.0)
Platelets: 149 10*3/uL — ABNORMAL LOW (ref 150–400)
RBC: 2.71 MIL/uL — ABNORMAL LOW (ref 3.87–5.11)
RDW: 13.7 % (ref 11.5–15.5)
WBC: 6.5 10*3/uL (ref 4.0–10.5)
nRBC: 0 % (ref 0.0–0.2)

## 2020-10-26 MED ORDER — CLOPIDOGREL BISULFATE 75 MG PO TABS
75.0000 mg | ORAL_TABLET | Freq: Every day | ORAL | Status: DC
  Administered 2020-10-26 – 2020-10-28 (×3): 75 mg via ORAL
  Filled 2020-10-26 (×3): qty 1

## 2020-10-26 MED ORDER — SODIUM CHLORIDE 0.9 % IV SOLN
1.0000 g | INTRAVENOUS | Status: DC
  Administered 2020-10-26 – 2020-10-28 (×2): 1 g via INTRAVENOUS
  Filled 2020-10-26 (×3): qty 10

## 2020-10-26 MED ORDER — HEPARIN SODIUM (PORCINE) 5000 UNIT/ML IJ SOLN
5000.0000 [IU] | Freq: Three times a day (TID) | INTRAMUSCULAR | Status: DC
  Administered 2020-10-26 – 2020-10-28 (×8): 5000 [IU] via SUBCUTANEOUS
  Filled 2020-10-26 (×8): qty 1

## 2020-10-26 MED ORDER — HYDRALAZINE HCL 25 MG PO TABS
25.0000 mg | ORAL_TABLET | Freq: Three times a day (TID) | ORAL | Status: DC
  Administered 2020-10-26 – 2020-10-28 (×8): 25 mg via ORAL
  Filled 2020-10-26 (×8): qty 1

## 2020-10-26 NOTE — Progress Notes (Addendum)
Physical Therapy Treatment Patient Details Name: Desiree Pollard MRN: [id number] DOB: 06/21/1946 Today's Date: 10/26/2020    History of Present Illness Desiree Pollard is a 74 y.o. female with history of chronic kidney disease stage IV baseline creatinine around 2.5, complete heart block status post pacemaker placement, history of breast cancer in remission, diabetes mellitus type 2, hypertension, chronic anemia admitted due to experiencing dizziness.  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  Patient blood work also shows new thrombocytopenia with mild worsening of anemia from usual around 10 to 8.    PT Comments    Pt supine in bed on arrival.  Utilized sara stedy to allow patient to be more participatory in transfer training.  Pt tolerated session well but mobility limited due to fatigue. After initial stand noted with bowel incontinence of bed pad.  Continue to recommend SNF at d/c.      Follow Up Recommendations  SNF;Supervision/Assistance - 24 hour     Equipment Recommendations  None recommended by PT    Recommendations for Other Services       Precautions / Restrictions Precautions Precautions: Fall Restrictions Weight Bearing Restrictions: No    Mobility  Bed Mobility Overal bed mobility: Needs Assistance Bed Mobility: Rolling;Sidelying to Sit Rolling: Mod assist Sidelying to sit: Mod assist       General bed mobility comments: Cues for hooklying and assistance to roll hips and advance LEs to edge of bed.  Once in sidelying require moderate assistance to elevate trunk into a seated position.  Transfers Overall transfer level: Needs assistance Equipment used: Ambulation equipment used (sara stedy) Transfers: Sit to/from Stand Sit to Stand: Mod assist         General transfer comment: Utilized stedy to allow patient to participate more in process.  required cues for hand and foot placement to  achieve standing.  Moderate assistance to pull into standing.  Once in standing limited standing tolerance noted.  Cues for hip extension.  Ambulation/Gait                 Stairs             Wheelchair Mobility    Modified Rankin (Stroke Patients Only) Modified Rankin (Stroke Patients Only) Pre-Morbid Rankin Score: Moderate disability Modified Rankin: Moderately severe disability     Balance     Sitting balance-Leahy Scale: Fair       Standing balance-Leahy Scale: Poor                              Cognition Arousal/Alertness: Awake/alert Behavior During Therapy: Flat affect Overall Cognitive Status: Impaired/Different from baseline Area of Impairment: Safety/judgement;Problem solving;Following commands                       Following Commands: Follows one step commands consistently;Follows one step commands with increased time;Follows multi-step commands consistently;Follows multi-step commands with increased time Safety/Judgement: Decreased awareness of safety;Decreased awareness of deficits   Problem Solving: Slow processing;Decreased initiation;Difficulty sequencing;Requires verbal cues General Comments: A&Ox4. Pt required repeated cues and extra time to process and respond to cues to initiate tasks.      Exercises      General Comments        Pertinent Vitals/Pain Pain Assessment: No/denies pain    Home Living  Prior Function            PT Goals (current goals can now be found in the care plan section) Acute Rehab PT Goals Patient Stated Goal: to get more indep.  Potential to Achieve Goals: Good Progress towards PT goals: Progressing toward goals    Frequency    Min 3X/week      PT Plan Current plan remains appropriate    Co-evaluation              AM-PAC PT "6 Clicks" Mobility   Outcome Measure  Help needed turning from your back to your side while in a flat bed  without using bedrails?: A Little Help needed moving from lying on your back to sitting on the side of a flat bed without using bedrails?: A Lot Help needed moving to and from a bed to a chair (including a wheelchair)?: A Lot Help needed standing up from a chair using your arms (e.g., wheelchair or bedside chair)?: A Lot Help needed to walk in hospital room?: A Lot Help needed climbing 3-5 steps with a railing? : Total 6 Click Score: 12    End of Session Equipment Utilized During Treatment: Gait belt Activity Tolerance: Patient tolerated treatment well Patient left: in chair;with call bell/phone within reach;with chair alarm set Nurse Communication: Mobility status (informed nursing of BM) PT Visit Diagnosis: Other abnormalities of gait and mobility (R26.89);Muscle weakness (generalized) (M62.81);Dizziness and giddiness (R42);Unsteadiness on feet (R26.81);Difficulty in walking, not elsewhere classified (R26.2)     Time: 1610-9604 PT Time Calculation (min) (ACUTE ONLY): 19 min  Charges:  $Therapeutic Activity: 8-22 mins                     Erasmo Leventhal , PTA Acute Rehabilitation Services Pager [phone] Office [phone]     Nick Armel Eli Hose 10/26/2020, 3:52 PM

## 2020-10-26 NOTE — Progress Notes (Signed)
PROGRESS NOTE        PATIENT DETAILS Name: Desiree Pollard Age: 74 y.o. Sex: female Date of Birth: Dec 02, 1946 Admit Date: 10/18/2020 Admitting Physician Rise Patience, MD PJA:[id number], No Pcp Per  Brief Narrative: Patient is a 74 y.o. 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  See below for further details.  Significant events: 11/5>> presented with dizziness-admit to [facility] 11/12>> developed hematuria 11/13>> hematuria resolved-amber-colored urine present in canister.  Significant studies: 11/5>> CT head: No acute intracranial abnormality 11/5>> chest x-ray: No active disease 11/8>> MRI brain: Small acute infarct in the right thalamus 11/9>> Echo: Apical variant hypertrophic cardiomyopathy, EF 67-34%, grade 1 diastolic dysfunction 19/37>> carotid Doppler: No significant stenosis 11/10>> LDL 61 11/10>> A1c: 6.1  Antimicrobial therapy: None  Microbiology data: None  Procedures : None  Consults: Neurology  DVT Prophylaxis : heparin injection 5,000 Units Start: 10/26/20 1430  Subjective: No major issues overnight-lying comfortably in bed-amber-colored urine present in canister.  Assessment/Plan: Acute CVA: Not sure if this accounts for patient's dizziness-could have had orthostatic hypotension-work-up as above-neurology following with recommendations to continue aspirin/Plavix x3 weeks then Plavix alone.  However on 11/12-developed hematuria-aspirin/Plavix was held-hematuria has resolved-probably was from UTI.  Discussed with stroke MD Dr Wilhelmina Mcardle will go ahead and place on Plavix and follow closely.  Hematuria: Probably secondary UTI-occurred on 11/12-started on Rocephin.  Await cultures (ordered on 11/12).  See above regarding plans to restart antiplatelet agents.  AKI on CKD 4: AKI likely hemodynamically  mediated-improved-and close to baseline.  HTN: BP continues to be elevated-add hydralazine-continue Coreg and Procardia.   DM-2: CBG stable-continue SSI.  Recent Labs    10/25/20 2113 10/26/20 0609 10/26/20 1125  GLUCAP 210* 97 113*   Acute on chronic anemia: No evidence of blood loss-FOBT negative-has anemia likely related to CKD at baseline-some worsening likely due to IV fluid hydration.  Stable for close monitoring by PCP in the outpatient setting.  Thrombocytopenia: Stable-mild-continue outpatient follow follow-up.  History of complete heart block-s/p PPM placement: Paced rhythm on telemetry.  Apical variant hypertrophic cardiomyopathy: Reviewed outpatient echocardiograms in care everywhere-known issue-stable for outpatient follow-up with her primary cardiologist.  History of breast cancer: Apparently in remission-resume outpatient follow-up with oncology on discharge.  Diet: Diet Order            Diet - low sodium heart healthy           Diet heart healthy/carb modified Room service appropriate? Yes; Fluid consistency: Thin  Diet effective now                  Code Status: Full code   Family Communication: Sister-Christine [phone] the phone on 11/13  Disposition Plan: Status is: Inpatient  Remains inpatient appropriate because:Inpatient level of care appropriate due to severity of illness  Dispo: The patient is from: Home              Anticipated d/c is to: SNF vs HHPT (sister has changed her mind-and is wanting SNF)              Anticipated d/c date is: 1 day              Patient currently is not medically stable to d/c.  Barriers to Discharge:  Antimicrobial agents: Anti-infectives (From admission, onward)   Start     Dose/Rate Route Frequency Ordered Stop   10/26/20 0815  cefTRIAXone (ROCEPHIN) 1 g in sodium chloride 0.9 % 100 mL IVPB       Note to Pharmacy: Please give after urine culture collected   1 g 200 mL/hr over 30 Minutes  Intravenous Every 24 hours 10/26/20 0717         Time spent: 25 minutes-Greater than 50% of this time was spent in counseling, explanation of diagnosis, planning of further management, and coordination of care.  MEDICATIONS: Scheduled Meds: . atorvastatin  80 mg Oral Daily  . carvedilol  25 mg Oral BID WC  . clopidogrel  75 mg Oral Daily  . donepezil  10 mg Oral QHS  . dorzolamide-timolol  1 drop Both Eyes BID  . ferrous sulfate  325 mg Oral Q breakfast  . heparin injection (subcutaneous)  5,000 Units Subcutaneous Q8H  . insulin aspart  0-6 Units Subcutaneous TID WC  . insulin glargine  4 Units Subcutaneous Daily  . latanoprost  1 drop Right Eye QHS  . NIFEdipine  60 mg Oral Daily  . pantoprazole  40 mg Oral BID   Continuous Infusions: . cefTRIAXone (ROCEPHIN)  IV 1 g (10/26/20 1016)   PRN Meds:.acetaminophen **OR** acetaminophen, hydrALAZINE   PHYSICAL EXAM: Vital signs: Vitals:   10/25/20 2044 10/26/20 0427 10/26/20 0824 10/26/20 1227  BP: (!) 146/57 (!) 174/67 (!) 167/70 (!) 173/71  Pulse: (!) 59 63 (!) 59 (!) 59  Resp: 17 18    Temp: 97.9 F (36.6 C) 98.5 F (36.9 C) 98.3 F (36.8 C) 98.5 F (36.9 C)  TempSrc: Oral Oral Oral Oral  SpO2: 100% 100% 100% 100%  Weight:      Height:       Filed Weights   10/18/20 2325 10/19/20 1106  Weight: 70.3 kg 68.1 kg   Body mass index is 23.51 kg/m.   Gen Exam:Alert awake-not in any distress HEENT:atraumatic, normocephalic Chest: B/L clear to auscultation anteriorly CVS:S1S2 regular Abdomen:soft non tender, non distended Extremities:no edema Neurology: Minimal left-sided hemiparesis Skin: no rash  I have personally reviewed following labs and imaging studies  LABORATORY DATA: CBC: Recent Labs  Lab 10/20/20 0439 10/21/20 0125 10/22/20 0918 10/26/20 0248  WBC 4.8 5.1 4.4 6.5  NEUTROABS 2.9 3.2 3.0  --   HGB 7.7* 7.6* 8.8* 7.7*  HCT 24.5* 24.9* 28.6* 24.8*  MCV 92.5 93.3 92.9 91.5  PLT 151 154 153 149*     Basic Metabolic Panel: Recent Labs  Lab 10/20/20 0439 10/21/20 0125 10/22/20 0918  NA 145 144 145  K 3.1* 3.7 4.1  CL 118* 120* 118*  CO2 20* 19* 19*  GLUCOSE 81 133* 152*  BUN 31* 30* 28*  CREATININE 2.97* 2.88* 2.82*  CALCIUM 8.1* 8.2* 8.9  MG 1.9 1.8  --   PHOS 3.6 3.2  --     GFR: Estimated Creatinine Clearance: 16.5 mL/min (A) (by C-G formula based on SCr of 2.82 mg/dL (H)).  Liver Function Tests: No results for input(s): AST, ALT, ALKPHOS, BILITOT, PROT, ALBUMIN in the last 168 hours. No results for input(s): LIPASE, AMYLASE in the last 168 hours. No results for input(s): AMMONIA in the last 168 hours.  Coagulation Profile: No results for input(s): INR, PROTIME in the last 168 hours.  Cardiac Enzymes: No results for input(s): CKTOTAL, CKMB, CKMBINDEX, TROPONINI in the last 168 hours.  BNP (last 3 results) No results for  input(s): PROBNP in the last 8760 hours.  Lipid Profile: No results for input(s): CHOL, HDL, LDLCALC, TRIG, CHOLHDL, LDLDIRECT in the last 72 hours.  Thyroid Function Tests: No results for input(s): TSH, T4TOTAL, FREET4, T3FREE, THYROIDAB in the last 72 hours.  Anemia Panel: No results for input(s): VITAMINB12, FOLATE, FERRITIN, TIBC, IRON, RETICCTPCT in the last 72 hours.  Urine analysis:    Component Value Date/Time   COLORURINE YELLOW 10/25/2020 2259   APPEARANCEUR CLOUDY (A) 10/25/2020 2259   LABSPEC 1.013 10/25/2020 2259   PHURINE 5.0 10/25/2020 2259   GLUCOSEU 50 (A) 10/25/2020 2259   HGBUR MODERATE (A) 10/25/2020 2259   BILIRUBINUR NEGATIVE 10/25/2020 [place] 10/25/2020 2259   PROTEINUR >=300 (A) 10/25/2020 2259   NITRITE NEGATIVE 10/25/2020 2259   LEUKOCYTESUR LARGE (A) 10/25/2020 2259    Sepsis Labs: Lactic Acid, Venous No results found for: LATICACIDVEN  MICROBIOLOGY: Recent Results (from the past 240 hour(s))  Respiratory Panel by RT PCR (Flu A&B, Covid) - Nasopharyngeal Swab     Status: None    Collection Time: 10/19/20  5:40 AM   Specimen: Nasopharyngeal Swab  Result Value Ref Range Status   SARS Coronavirus 2 by RT PCR NEGATIVE NEGATIVE Final    Comment: (NOTE) SARS-CoV-2 target nucleic acids are NOT DETECTED.  The SARS-CoV-2 RNA is generally detectable in upper respiratoy specimens during the acute phase of infection. The lowest concentration of SARS-CoV-2 viral copies this assay can detect is 131 copies/mL. A negative result does not preclude SARS-Cov-2 infection and should not be used as the sole basis for treatment or other patient management decisions. A negative result may occur with  improper specimen collection/handling, submission of specimen other than nasopharyngeal swab, presence of viral mutation(s) within the areas targeted by this assay, and inadequate number of viral copies (<131 copies/mL). A negative result must be combined with clinical observations, patient history, and epidemiological information. The expected result is Negative.  Fact Sheet for Patients:  PinkCheek.be  Fact Sheet for Healthcare Providers:  GravelBags.it  This test is no t yet approved or cleared by the [place] FDA and  has been authorized for detection and/or diagnosis of SARS-CoV-2 by FDA under an Emergency Use Authorization (EUA). This EUA will remain  in effect (meaning this test can be used) for the duration of the COVID-19 declaration under Section 564(b)(1) of the Act, 21 U.S.C. section 360bbb-3(b)(1), unless the authorization is terminated or revoked sooner.     Influenza A by PCR NEGATIVE NEGATIVE Final   Influenza B by PCR NEGATIVE NEGATIVE Final    Comment: (NOTE) The Xpert Xpress SARS-CoV-2/FLU/RSV assay is intended as an aid in  the diagnosis of influenza from Nasopharyngeal swab specimens and  should not be used as a sole basis for treatment. Nasal washings and  aspirates are unacceptable for Xpert  Xpress SARS-CoV-2/FLU/RSV  testing.  Fact Sheet for Patients: PinkCheek.be  Fact Sheet for Healthcare Providers: GravelBags.it  This test is not yet approved or cleared by the [place] FDA and  has been authorized for detection and/or diagnosis of SARS-CoV-2 by  FDA under an Emergency Use Authorization (EUA). This EUA will remain  in effect (meaning this test can be used) for the duration of the  Covid-19 declaration under Section 564(b)(1) of the Act, 21  U.S.C. section 360bbb-3(b)(1), unless the authorization is  terminated or revoked. Performed at [facility], [place] [address] [place],  [address]   Respiratory Panel by RT PCR (Flu  A&B, Covid) - Nasopharyngeal Swab     Status: None   Collection Time: 10/25/20 12:19 PM   Specimen: Nasopharyngeal Swab  Result Value Ref Range Status   SARS Coronavirus 2 by RT PCR NEGATIVE NEGATIVE Final    Comment: (NOTE) SARS-CoV-2 target nucleic acids are NOT DETECTED.  The SARS-CoV-2 RNA is generally detectable in upper respiratoy specimens during the acute phase of infection. The lowest concentration of SARS-CoV-2 viral copies this assay can detect is 131 copies/mL. A negative result does not preclude SARS-Cov-2 infection and should not be used as the sole basis for treatment or other patient management decisions. A negative result may occur with  improper specimen collection/handling, submission of specimen other than nasopharyngeal swab, presence of viral mutation(s) within the areas targeted by this assay, and inadequate number of viral copies (<131 copies/mL). A negative result must be combined with clinical observations, patient history, and epidemiological information. The expected result is Negative.  Fact Sheet for Patients:  PinkCheek.be  Fact Sheet for Healthcare Providers:  GravelBags.it  This  test is no t yet approved or cleared by the [place] FDA and  has been authorized for detection and/or diagnosis of SARS-CoV-2 by FDA under an Emergency Use Authorization (EUA). This EUA will remain  in effect (meaning this test can be used) for the duration of the COVID-19 declaration under Section 564(b)(1) of the Act, 21 U.S.C. section 360bbb-3(b)(1), unless the authorization is terminated or revoked sooner.     Influenza A by PCR NEGATIVE NEGATIVE Final   Influenza B by PCR NEGATIVE NEGATIVE Final    Comment: (NOTE) The Xpert Xpress SARS-CoV-2/FLU/RSV assay is intended as an aid in  the diagnosis of influenza from Nasopharyngeal swab specimens and  should not be used as a sole basis for treatment. Nasal washings and  aspirates are unacceptable for Xpert Xpress SARS-CoV-2/FLU/RSV  testing.  Fact Sheet for Patients: PinkCheek.be  Fact Sheet for Healthcare Providers: GravelBags.it  This test is not yet approved or cleared by the [place] FDA and  has been authorized for detection and/or diagnosis of SARS-CoV-2 by  FDA under an Emergency Use Authorization (EUA). This EUA will remain  in effect (meaning this test can be used) for the duration of the  Covid-19 declaration under Section 564(b)(1) of the Act, 21  U.S.C. section 360bbb-3(b)(1), unless the authorization is  terminated or revoked. Performed at [facility], [place] [address], [place], [place] [address]     RADIOLOGY STUDIES/RESULTS: No results found.   LOS: 7 days   Oren Binet, MD  [facility]    To contact the attending provider between 7A-7P or the covering provider during after hours 7P-7A, please log into the web site [email or web address] and access using universal [place] password for that web site. If you do not have the password, please call the hospital operator.  10/26/2020, 1:42 PM

## 2020-10-27 DIAGNOSIS — D696 Thrombocytopenia, unspecified: Secondary | ICD-10-CM | POA: Diagnosis not present

## 2020-10-27 DIAGNOSIS — N179 Acute kidney failure, unspecified: Secondary | ICD-10-CM | POA: Diagnosis not present

## 2020-10-27 DIAGNOSIS — I10 Essential (primary) hypertension: Secondary | ICD-10-CM | POA: Diagnosis not present

## 2020-10-27 LAB — GLUCOSE, CAPILLARY
Glucose-Capillary: 96 mg/dL (ref 70–99)
Glucose-Capillary: 111 mg/dL — ABNORMAL HIGH (ref 70–99)
Glucose-Capillary: 106 mg/dL — ABNORMAL HIGH (ref 70–99)
Glucose-Capillary: 116 mg/dL — ABNORMAL HIGH (ref 70–99)
Glucose-Capillary: 118 mg/dL — ABNORMAL HIGH (ref 70–99)

## 2020-10-27 NOTE — Progress Notes (Signed)
Verified with Dr Sloan Leiter that pt does not need a urine culture to be done or sent.  She is already on antibiotics and urine culture is not needed at this time.  Plan will be for discharge possibly Monday if has an uneventful evening and night tonight.

## 2020-10-27 NOTE — Progress Notes (Signed)
PROGRESS NOTE        PATIENT DETAILS Name: Desiree Pollard Age: 74 y.o. Sex: female Date of Birth: [birth year] Admit Date: 10/18/2020 Admitting Physician Rise Patience, MD [id number]:[id number], No Pcp Per  Brief Narrative: Patient is a 74 y.o. 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  See below for further details.  Significant events: 11/5>> presented with dizziness-admit to [facility] 11/12>> developed hematuria 11/13>> hematuria resolved-amber-colored urine present in canister.  Significant studies: 11/5>> CT head: No acute intracranial abnormality 11/5>> chest x-ray: No active disease 11/8>> MRI brain: Small acute infarct in the right thalamus 11/9>> Echo: Apical variant hypertrophic cardiomyopathy, EF 09-81%, grade 1 diastolic dysfunction 19/14>> carotid Doppler: No significant stenosis 11/10>> LDL 61 11/10>> A1c: 6.1  Antimicrobial therapy: 11/13>>  Microbiology data: 11/12>> urine culture: E. coli  Procedures : None  Consults: Neurology  DVT Prophylaxis : heparin injection 5,000 Units Start: 10/26/20 1430  Subjective: Sleeping comfortably in bed-awoke-recognize me-follows all my commands-answers all questions appropriately.  Hematuria continues to stay resolved.  Amber-colored urine.  Assessment/Plan: Acute CVA: Not sure if this accounts for patient's dizziness-could have had orthostatic hypotension-work-up as above-neurology following with recommendations to continue aspirin/Plavix x3 weeks then Plavix alone.  However on 11/12-developed hematuria-aspirin/Plavix was held-hematuria has resolved-probably was from UTI.  After discussion with stroke MD- Dr Gillis Santa on Plavix-continue to monitor closely.  Hematuria: Probably secondary UTI-occurred on 11/12-on IV Rocephin-see above regarding antiplatelet agents.   Await urine cultures.  AKI on CKD 4: AKI likely hemodynamically mediated-improved-and close to baseline.  HTN: BP continues better-continue hydralazine, Coreg and Procardia.   DM-2: CBG stable-continue SSI.  Recent Labs    10/26/20 2106 10/27/20 0635 10/27/20 0915  GLUCAP 146* 96 111*   Acute on chronic anemia: No evidence of blood loss-FOBT negative-has anemia likely related to CKD at baseline-some worsening likely due to IV fluid hydration.  Stable for close monitoring by PCP in the outpatient setting.  Thrombocytopenia: Stable-mild-continue outpatient follow follow-up.  History of complete heart block-s/p PPM placement: Paced rhythm on telemetry.  Apical variant hypertrophic cardiomyopathy: Reviewed outpatient echocardiograms in care everywhere-known issue-stable for outpatient follow-up with her primary cardiologist.  History of breast cancer: Apparently in remission-resume outpatient follow-up with oncology on discharge.  Diet: Diet Order            Diet - low sodium heart healthy           Diet heart healthy/carb modified Room service appropriate? Yes; Fluid consistency: Thin  Diet effective now                  Code Status: Full code   Family Communication: Sister-Christine [phone] the phone on 11/14  Disposition Plan: Status is: Inpatient  Remains inpatient appropriate because:Inpatient level of care appropriate due to severity of illness  Dispo: The patient is from: Home              Anticipated d/c is to: SNF               Anticipated d/c date is: 1 day              Patient currently is not medically stable to d/c.  Barriers to Discharge:  Antimicrobial agents: Anti-infectives (From admission, onward)  Start     Dose/Rate Route Frequency Ordered Stop   10/26/20 0815  cefTRIAXone (ROCEPHIN) 1 g in sodium chloride 0.9 % 100 mL IVPB       Note to Pharmacy: Please give after urine culture collected   1 g 200 mL/hr over 30 Minutes  Intravenous Every 24 hours 10/26/20 0717         Time spent: 25 minutes-Greater than 50% of this time was spent in counseling, explanation of diagnosis, planning of further management, and coordination of care.  MEDICATIONS: Scheduled Meds: . atorvastatin  80 mg Oral Daily  . carvedilol  25 mg Oral BID WC  . clopidogrel  75 mg Oral Daily  . donepezil  10 mg Oral QHS  . dorzolamide-timolol  1 drop Both Eyes BID  . ferrous sulfate  325 mg Oral Q breakfast  . heparin injection (subcutaneous)  5,000 Units Subcutaneous Q8H  . hydrALAZINE  25 mg Oral Q8H  . insulin aspart  0-6 Units Subcutaneous TID WC  . insulin glargine  4 Units Subcutaneous Daily  . latanoprost  1 drop Right Eye QHS  . NIFEdipine  60 mg Oral Daily  . pantoprazole  40 mg Oral BID   Continuous Infusions: . cefTRIAXone (ROCEPHIN)  IV Stopped (10/26/20 1055)   PRN Meds:.acetaminophen **OR** acetaminophen, hydrALAZINE   PHYSICAL EXAM: Vital signs: Vitals:   10/26/20 2215 10/26/20 2317 10/27/20 0351 10/27/20 0903  BP: (!) 144/61 (!) 135/55 (!) 141/62 (!) 145/59  Pulse: 60 (!) 59 61 63  Resp:  18 18   Temp:  98 F (36.7 C) 97.8 F (36.6 C) 98.5 F (36.9 C)  TempSrc:  Oral Oral Oral  SpO2:  100% 98% 100%  Weight:      Height:       Filed Weights   10/18/20 2325 10/19/20 1106  Weight: 70.3 kg 68.1 kg   Body mass index is 23.51 kg/m.   Gen Exam:Alert awake-not in any distress HEENT:atraumatic, normocephalic Chest: B/L clear to auscultation anteriorly CVS:S1S2 regular Abdomen:soft non tender, non distended Extremities:no edema Neurology: Non focal Skin: no rash  I have personally reviewed following labs and imaging studies  LABORATORY DATA: CBC: Recent Labs  Lab 10/21/20 0125 10/22/20 0918 10/26/20 0248  WBC 5.1 4.4 6.5  NEUTROABS 3.2 3.0  --   HGB 7.6* 8.8* 7.7*  HCT 24.9* 28.6* 24.8*  MCV 93.3 92.9 91.5  PLT 154 153 149*    Basic Metabolic Panel: Recent Labs  Lab 10/21/20 0125  10/22/20 0918  NA 144 145  K 3.7 4.1  CL 120* 118*  CO2 19* 19*  GLUCOSE 133* 152*  BUN 30* 28*  CREATININE 2.88* 2.82*  CALCIUM 8.2* 8.9  MG 1.8  --   PHOS 3.2  --     GFR: Estimated Creatinine Clearance: 16.5 mL/min (A) (by C-G formula based on SCr of 2.82 mg/dL (H)).  Liver Function Tests: No results for input(s): AST, ALT, ALKPHOS, BILITOT, PROT, ALBUMIN in the last 168 hours. No results for input(s): LIPASE, AMYLASE in the last 168 hours. No results for input(s): AMMONIA in the last 168 hours.  Coagulation Profile: No results for input(s): INR, PROTIME in the last 168 hours.  Cardiac Enzymes: No results for input(s): CKTOTAL, CKMB, CKMBINDEX, TROPONINI in the last 168 hours.  BNP (last 3 results) No results for input(s): PROBNP in the last 8760 hours.  Lipid Profile: No results for input(s): CHOL, HDL, LDLCALC, TRIG, CHOLHDL, LDLDIRECT in the last 72 hours.  Thyroid  Function Tests: No results for input(s): TSH, T4TOTAL, FREET4, T3FREE, THYROIDAB in the last 72 hours.  Anemia Panel: No results for input(s): VITAMINB12, FOLATE, FERRITIN, TIBC, IRON, RETICCTPCT in the last 72 hours.  Urine analysis:    Component Value Date/Time   COLORURINE YELLOW 10/25/2020 2259   APPEARANCEUR CLOUDY (A) 10/25/2020 2259   LABSPEC 1.013 10/25/2020 2259   PHURINE 5.0 10/25/2020 2259   GLUCOSEU 50 (A) 10/25/2020 2259   HGBUR MODERATE (A) 10/25/2020 2259   BILIRUBINUR NEGATIVE 10/25/2020 [place] 10/25/2020 2259   PROTEINUR >=300 (A) 10/25/2020 2259   NITRITE NEGATIVE 10/25/2020 2259   LEUKOCYTESUR LARGE (A) 10/25/2020 2259    Sepsis Labs: Lactic Acid, Venous No results found for: LATICACIDVEN  MICROBIOLOGY: Recent Results (from the past 240 hour(s))  Respiratory Panel by RT PCR (Flu A&B, Covid) - Nasopharyngeal Swab     Status: None   Collection Time: 10/19/20  5:40 AM   Specimen: Nasopharyngeal Swab  Result Value Ref Range Status   SARS Coronavirus 2  by RT PCR NEGATIVE NEGATIVE Final    Comment: (NOTE) SARS-CoV-2 target nucleic acids are NOT DETECTED.  The SARS-CoV-2 RNA is generally detectable in upper respiratoy specimens during the acute phase of infection. The lowest concentration of SARS-CoV-2 viral copies this assay can detect is 131 copies/mL. A negative result does not preclude SARS-Cov-2 infection and should not be used as the sole basis for treatment or other patient management decisions. A negative result may occur with  improper specimen collection/handling, submission of specimen other than nasopharyngeal swab, presence of viral mutation(s) within the areas targeted by this assay, and inadequate number of viral copies (<131 copies/mL). A negative result must be combined with clinical observations, patient history, and epidemiological information. The expected result is Negative.  Fact Sheet for Patients:  PinkCheek.be  Fact Sheet for Healthcare Providers:  GravelBags.it  This test is no t yet approved or cleared by the [place] FDA and  has been authorized for detection and/or diagnosis of SARS-CoV-2 by FDA under an Emergency Use Authorization (EUA). This EUA will remain  in effect (meaning this test can be used) for the duration of the COVID-19 declaration under Section 564(b)(1) of the Act, 21 U.S.C. section 360bbb-3(b)(1), unless the authorization is terminated or revoked sooner.     Influenza A by PCR NEGATIVE NEGATIVE Final   Influenza B by PCR NEGATIVE NEGATIVE Final    Comment: (NOTE) The Xpert Xpress SARS-CoV-2/FLU/RSV assay is intended as an aid in  the diagnosis of influenza from Nasopharyngeal swab specimens and  should not be used as a sole basis for treatment. Nasal washings and  aspirates are unacceptable for Xpert Xpress SARS-CoV-2/FLU/RSV  testing.  Fact Sheet for Patients: PinkCheek.be  Fact Sheet  for Healthcare Providers: GravelBags.it  This test is not yet approved or cleared by the [place] FDA and  has been authorized for detection and/or diagnosis of SARS-CoV-2 by  FDA under an Emergency Use Authorization (EUA). This EUA will remain  in effect (meaning this test can be used) for the duration of the  Covid-19 declaration under Section 564(b)(1) of the Act, 21  U.S.C. section 360bbb-3(b)(1), unless the authorization is  terminated or revoked. Performed at [facility], [place] [address], [place], [place] [address]   Respiratory Panel by RT PCR (Flu A&B, Covid) - Nasopharyngeal Swab     Status: None   Collection Time: 10/25/20 12:19 PM   Specimen: Nasopharyngeal Swab  Result Value Ref  Range Status   SARS Coronavirus 2 by RT PCR NEGATIVE NEGATIVE Final    Comment: (NOTE) SARS-CoV-2 target nucleic acids are NOT DETECTED.  The SARS-CoV-2 RNA is generally detectable in upper respiratoy specimens during the acute phase of infection. The lowest concentration of SARS-CoV-2 viral copies this assay can detect is 131 copies/mL. A negative result does not preclude SARS-Cov-2 infection and should not be used as the sole basis for treatment or other patient management decisions. A negative result may occur with  improper specimen collection/handling, submission of specimen other than nasopharyngeal swab, presence of viral mutation(s) within the areas targeted by this assay, and inadequate number of viral copies (<131 copies/mL). A negative result must be combined with clinical observations, patient history, and epidemiological information. The expected result is Negative.  Fact Sheet for Patients:  PinkCheek.be  Fact Sheet for Healthcare Providers:  GravelBags.it  This test is no t yet approved or cleared by the [place] FDA and  has been authorized for detection and/or diagnosis of  SARS-CoV-2 by FDA under an Emergency Use Authorization (EUA). This EUA will remain  in effect (meaning this test can be used) for the duration of the COVID-19 declaration under Section 564(b)(1) of the Act, 21 U.S.C. section 360bbb-3(b)(1), unless the authorization is terminated or revoked sooner.     Influenza A by PCR NEGATIVE NEGATIVE Final   Influenza B by PCR NEGATIVE NEGATIVE Final    Comment: (NOTE) The Xpert Xpress SARS-CoV-2/FLU/RSV assay is intended as an aid in  the diagnosis of influenza from Nasopharyngeal swab specimens and  should not be used as a sole basis for treatment. Nasal washings and  aspirates are unacceptable for Xpert Xpress SARS-CoV-2/FLU/RSV  testing.  Fact Sheet for Patients: PinkCheek.be  Fact Sheet for Healthcare Providers: GravelBags.it  This test is not yet approved or cleared by the [place] FDA and  has been authorized for detection and/or diagnosis of SARS-CoV-2 by  FDA under an Emergency Use Authorization (EUA). This EUA will remain  in effect (meaning this test can be used) for the duration of the  Covid-19 declaration under Section 564(b)(1) of the Act, 21  U.S.C. section 360bbb-3(b)(1), unless the authorization is  terminated or revoked. Performed at [facility], [place] [address], [place], [place] [address]   Culture, Urine     Status: Abnormal (Preliminary result)   Collection Time: 10/25/20  3:29 PM   Specimen: Urine, Random  Result Value Ref Range Status   Specimen Description URINE, RANDOM  Final   Special Requests NONE  Final   Culture (A)  Final    >=100,000 COLONIES/mL ESCHERICHIA COLI SUSCEPTIBILITIES TO FOLLOW Performed at [facility] [address] [place], [place] [address]    Report Status PENDING  Incomplete    RADIOLOGY STUDIES/RESULTS: No results found.   LOS: 8 days   Oren Binet, MD  [facility]    To contact the  attending provider between 7A-7P or the covering provider during after hours 7P-7A, please log into the web site [email or web address] and access using universal [place] password for that web site. If you do not have the password, please call the hospital operator.  10/27/2020, 10:27 AM

## 2020-10-28 DIAGNOSIS — N179 Acute kidney failure, unspecified: Secondary | ICD-10-CM | POA: Diagnosis not present

## 2020-10-28 DIAGNOSIS — I10 Essential (primary) hypertension: Secondary | ICD-10-CM | POA: Diagnosis not present

## 2020-10-28 DIAGNOSIS — D696 Thrombocytopenia, unspecified: Secondary | ICD-10-CM | POA: Diagnosis not present

## 2020-10-28 LAB — GLUCOSE, CAPILLARY
Glucose-Capillary: 82 mg/dL (ref 70–99)
Glucose-Capillary: 103 mg/dL — ABNORMAL HIGH (ref 70–99)
Glucose-Capillary: 139 mg/dL — ABNORMAL HIGH (ref 70–99)
Glucose-Capillary: 140 mg/dL — ABNORMAL HIGH (ref 70–99)

## 2020-10-28 LAB — URINE CULTURE: Culture: >=100000 — AB

## 2020-10-28 LAB — RESPIRATORY PANEL BY RT PCR (FLU A&B, COVID)
Influenza A by PCR: NEGATIVE
Influenza B by PCR: NEGATIVE
SARS Coronavirus 2 by RT PCR: NEGATIVE

## 2020-10-28 MED ORDER — HYDRALAZINE HCL 25 MG PO TABS
25.0000 mg | ORAL_TABLET | Freq: Three times a day (TID) | ORAL | Status: DC
Start: 2020-10-28 — End: 2020-11-26

## 2020-10-28 NOTE — Plan of Care (Signed)
  Problem: Education: Goal: Knowledge of General Education information will improve Description: Including pain rating scale, medication(s)/side effects and non-pharmacologic comfort measures 10/28/2020 1234 by Caroll Rancher, RN Outcome: Adequate for Discharge 10/28/2020 1234 by Caroll Rancher, RN Outcome: Progressing 10/28/2020 1115 by Caroll Rancher, RN Outcome: Progressing   Problem: Health Behavior/Discharge Planning: Goal: Ability to manage health-related needs will improve 10/28/2020 1234 by Caroll Rancher, RN Outcome: Adequate for Discharge 10/28/2020 1234 by Caroll Rancher, RN Outcome: Progressing 10/28/2020 1115 by Caroll Rancher, RN Outcome: Progressing   Problem: Clinical Measurements: Goal: Ability to maintain clinical measurements within normal limits will improve 10/28/2020 1234 by Caroll Rancher, RN Outcome: Adequate for Discharge 10/28/2020 1234 by Caroll Rancher, RN Outcome: Progressing 10/28/2020 1115 by Caroll Rancher, RN Outcome: Progressing

## 2020-10-28 NOTE — Discharge Summary (Signed)
PATIENT DETAILS Name: Desiree Pollard Age: 74 y.o. Sex: female Date of Birth: 08/12/1946 MRN: [id number]. Admitting Physician: Rise Patience, MD PTW:[id number], No Pcp Per  Admit Date: 10/18/2020 Discharge date: 10/28/2020  Recommendations for Outpatient Follow-up:  1. Follow up with PCP in 1-2 weeks 2. Please obtain CMP/CBC in one week 3. Please ensure follow up with neurology. 4. Please ensure follow-up with primary cardiology   Admitted From:  Home  Disposition: SNF   Home Health: No  Equipment/Devices: None  Discharge Condition: Stable  CODE STATUS: FULL CODE  Diet recommendation:  Diet Order            Diet - low sodium heart healthy           Diet heart healthy/carb modified Room service appropriate? Yes; Fluid consistency: Thin  Diet effective now                   Brief Narrative: Patient is a 74 y.o. 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  See below for further details.  Significant events: 11/5>> presented with dizziness-admit to [facility] 11/12>> developed hematuria 11/13>> hematuria resolved-amber-colored urine present in canister.  Significant studies: 11/5>> CT head: No acute intracranial abnormality 11/5>> chest x-ray: No active disease 11/8>> MRI brain: Small acute infarct in the right thalamus 11/9>> Echo: Apical variant hypertrophic cardiomyopathy, EF 51-70%, grade 1 diastolic dysfunction 01/74>> carotid Doppler: No significant stenosis 11/10>> LDL 61 11/10>> A1c: 6.1  Antimicrobial therapy: None  Microbiology data: None  Procedures : None  Consults: Neurology  Brief Hospital Course: Acute CVA: Not sure if this accounts for patient's dizziness-could have had orthostatic hypotension-work-up as above-neurology following with recommendations to continue aspirin/Plavix x3 weeks  then Plavix alone.  However on 11/12-developed hematuria-aspirin/Plavix was held-hematuria has resolved-probably was from UTI.  After discussion with stroke MD- Dr Gillis Santa on Plavix-continue to monitor closely.  Hematuria: Probably secondary UTI-occurred on 11/12-on IV Rocephin-see above regarding antiplatelet agents.  Amber-colored urine in POE canister this morning.  Cystitis: Likely causing hematuria-we will complete 3 days of Rocephin today.  Urine culture positive for pansensitive E. coli.  AKI on CKD 4: AKI likely hemodynamically mediated-improved-and close to baseline.  HTN: BP stable-continue Coreg, hydralazine and Procardia-lisinopril remains on hold-Procardia dose increased to 60 mg.  Please reassess and optimize at SNF.  DM-2: CBG stable-continue SSI on discharge  Acute on chronic anemia: No evidence of blood loss-FOBT negative-has anemia likely related to CKD at baseline-some worsening likely due to IV fluid hydration.  Stable for close monitoring by PCP in the outpatient setting.  Thrombocytopenia: Stable-mild-continue outpatient follow follow-up.  History of complete heart block-s/p PPM placement: Paced rhythm on telemetry.  Apical variant hypertrophic cardiomyopathy: Reviewed outpatient echocardiograms in care everywhere-known issue-stable for outpatient follow-up with her primary cardiologist.  History of breast cancer: Apparently in remission-resume outpatient follow-up with oncology on discharge.   Discharge Diagnoses:  Principal Problem:   Dizziness Active Problems:   Insulin dependent type 2 diabetes mellitus (HCC)   Hypertension   History of stroke   ARF (acute renal failure) (HCC)   Thrombocytopenia (HCC)   Discharge Instructions:  Activity:  As tolerated with Full fall precautions use walker/cane & assistance as needed Discharge Instructions    Ambulatory referral to Neurology   Complete by: As directed    An appointment is requested in  approximately: 4 weeks   Call  MD for:  difficulty breathing, headache or visual disturbances   Complete by: As directed    Diet - low sodium heart healthy   Complete by: As directed    Discharge instructions   Complete by: As directed    Follow with Primary MD  in 1-2 weeks  Please ensure follow-up with primary cardiology and stroke clinic.  Please get a complete blood count and chemistry panel checked by your Primary MD at your next visit, and again as instructed by your Primary MD.  Get Medicines reviewed and adjusted: Please take all your medications with you for your next visit with your Primary MD  Laboratory/radiological data: Please request your Primary MD to go over all hospital tests and procedure/radiological results at the follow up, please ask your Primary MD to get all Hospital records sent to his/her office.  In some cases, they will be blood work, cultures and biopsy results pending at the time of your discharge. Please request that your primary care M.D. follows up on these results.  Also Note the following: If you experience worsening of your admission symptoms, develop shortness of breath, life threatening emergency, suicidal or homicidal thoughts you must seek medical attention immediately by calling 911 or calling your MD immediately  if symptoms less severe.  You must read complete instructions/literature along with all the possible adverse reactions/side effects for all the Medicines you take and that have been prescribed to you. Take any new Medicines after you have completely understood and accpet all the possible adverse reactions/side effects.   Do not drive when taking Pain medications or sleeping medications (Benzodaizepines)  Do not take more than prescribed Pain, Sleep and Anxiety Medications. It is not advisable to combine anxiety,sleep and pain medications without talking with your primary care practitioner  Special Instructions: If you have smoked or  chewed Tobacco  in the last 2 yrs please stop smoking, stop any regular Alcohol  and or any Recreational drug use.  Wear Seat belts while driving.  Please note: You were cared for by a hospitalist during your hospital stay. Once you are discharged, your primary care physician will handle any further medical issues. Please note that NO REFILLS for any discharge medications will be authorized once you are discharged, as it is imperative that you return to your primary care physician (or establish a relationship with a primary care physician if you do not have one) for your post hospital discharge needs so that they can reassess your need for medications and monitor your lab values.   Check CBGs before meals and at bedtime.   Discharge instructions   Complete by: As directed    Follow with Primary MD  Patient, No Pcp Per in 1-2 weeks  Please get a complete blood count and chemistry panel checked by your Primary MD at your next visit, and again as instructed by your Primary MD.  Get Medicines reviewed and adjusted: Please take all your medications with you for your next visit with your Primary MD  Laboratory/radiological data: Please request your Primary MD to go over all hospital tests and procedure/radiological results at the follow up, please ask your Primary MD to get all Hospital records sent to his/her office.  In some cases, they will be blood work, cultures and biopsy results pending at the time of your discharge. Please request that your primary care M.D. follows up on these results.  Also Note the following: If you experience worsening of your admission symptoms, develop shortness of breath,  life threatening emergency, suicidal or homicidal thoughts you must seek medical attention immediately by calling 911 or calling your MD immediately  if symptoms less severe.  You must read complete instructions/literature along with all the possible adverse reactions/side effects for all the  Medicines you take and that have been prescribed to you. Take any new Medicines after you have completely understood and accpet all the possible adverse reactions/side effects.   Do not drive when taking Pain medications or sleeping medications (Benzodaizepines)  Do not take more than prescribed Pain, Sleep and Anxiety Medications. It is not advisable to combine anxiety,sleep and pain medications without talking with your primary care practitioner  Special Instructions: If you have smoked or chewed Tobacco  in the last 2 yrs please stop smoking, stop any regular Alcohol  and or any Recreational drug use.  Wear Seat belts while driving.  Please note: You were cared for by a hospitalist during your hospital stay. Once you are discharged, your primary care physician will handle any further medical issues. Please note that NO REFILLS for any discharge medications will be authorized once you are discharged, as it is imperative that you return to your primary care physician (or establish a relationship with a primary care physician if you do not have one) for your post hospital discharge needs so that they can reassess your need for medications and monitor your lab values.   Increase activity slowly   Complete by: As directed    Increase activity slowly   Complete by: As directed      Allergies as of 10/28/2020      Reactions   Amlodipine Nausea And Vomiting   Pt refuses to take due to severe n/v that began after starting amlodipine and stopped after discontinuation.      Medication List    STOP taking these medications   aspirin EC 81 MG tablet   ciprofloxacin 250 MG tablet Commonly known as: CIPRO   insulin glargine 100 UNIT/ML injection Commonly known as: LANTUS   Klor-Con M20 20 MEQ tablet Generic drug: potassium chloride SA   latanoprost 0.005 % ophthalmic solution Commonly known as: Xalatan   lisinopril 10 MG tablet Commonly known as: ZESTRIL   Plenvu 140 g Solr Generic  drug: PEG-KCl-NaCl-NaSulf-Na Asc-C     TAKE these medications   Accu-Chek FastClix Lancets Misc 1 each by Other route as directed. To test blood glucose daily.   atorvastatin 80 MG tablet Commonly known as: LIPITOR Take 80 mg by mouth daily.   carvedilol 25 MG tablet Commonly known as: COREG Take 25 mg by mouth 2 (two) times daily with a meal.   clopidogrel 75 MG tablet Commonly known as: PLAVIX Take 1 tablet (75 mg total) by mouth daily.   donepezil 10 MG tablet Commonly known as: ARICEPT Take 10 mg by mouth at bedtime.   dorzolamide-timolol 22.3-6.8 MG/ML ophthalmic solution Commonly known as: COSOPT INSTILL 1 DROP INTO BOTH EYES TWICE A DAY What changed: See the new instructions.   ferrous sulfate 325 (65 FE) MG tablet Take 325 mg by mouth daily with breakfast.   Fifty50 Glucose Meter 2.0 w/Device Kit 1 each by Other route See admin instructions. Use to check blood sugars   Fifty50 Pen Needles 32G X 4 MM Misc Generic drug: Insulin Pen Needle 1 each by Other route See admin instructions. Use as instructed.   hydrALAZINE 25 MG tablet Commonly known as: APRESOLINE Take 1 tablet (25 mg total) by mouth every 8 (eight) hours.  insulin aspart 100 UNIT/ML injection Commonly known as: novoLOG 0-6 Units, Subcutaneous, 3 times daily with meals CBG < 70: Implement Hypoglycemia measures CBG 70 - 120: 0 units CBG 121 - 150: 0 units CBG 151 - 200: 1 unit CBG 201-250: 2 units CBG 251-300: 3 units CBG 301-350: 4 units CBG 351-400: 5 units CBG > 400: Give 6 units and call MD   IRON PO Take 1 tablet by mouth daily. Natures bounty otc.   loperamide 2 MG tablet Commonly known as: Imodium A-D Take 1 tablet (2 mg total) by mouth 4 (four) times daily as needed for diarrhea or loose stools.   NIFEdipine 60 MG 24 hr tablet Commonly known as: ADALAT CC Take 1 tablet (60 mg total) by mouth daily. What changed: medication strength   pantoprazole 40 MG tablet Commonly known  as: PROTONIX Take 1 tablet (40 mg total) by mouth daily.       Contact information for follow-up providers    Primary care MD. Schedule an appointment as soon as possible for a visit in 2 week(s).        [facility] Follow up.   Why: office will call Contact information: [address]     [address] [place]  [address] [phone]           Contact information for after-discharge care    Destination    [facility] .   Service: Skilled Nursing Contact information: [place] [phone]                 Allergies  Allergen Reactions  . Amlodipine Nausea And Vomiting    Pt refuses to take due to severe n/v that began after starting amlodipine and stopped after discontinuation.      Other Procedures/Studies: CT HEAD WO CONTRAST  Result Date: 10/18/2020 CLINICAL DATA:  Several days of dizziness, recent antihypertensive medication change EXAM: CT HEAD WITHOUT CONTRAST TECHNIQUE: Contiguous axial images were obtained from the base of the skull through the vertex without intravenous contrast. COMPARISON:  CT 12/26/2019 FINDINGS: Brain: Stable region of encephalomalacia along the subcortical white matter adjacent the anterior horn of the right lateral ventricle. Additional bilateral lacunar type infarcts and encephalomalacia seen in the bilateral cerebellar hemispheres including some expected progressive encephalomalacia along the right inferior cerebellum corresponding with a presumed subacute infarct on comparison CT. No new CT evident areas of large vascular territory infarct or cortically based infarct are seen. No evidence of acute hemorrhage, hydrocephalus, extra-axial collection, visible mass lesion or mass effect. Patchy areas of white matter hypoattenuation are most compatible with chronic microvascular angiopathy. Symmetric prominence of the ventricles, cisterns and sulci  compatible with parenchymal volume loss. Vascular: Atherosclerotic calcification of the carotid siphons and intradural vertebral arteries. No hyperdense vessel. Skull: No calvarial fracture or suspicious osseous lesion. No scalp swelling or hematoma. Sinuses/Orbits: Paranasal sinuses and mastoid air cells are predominantly clear. Orbital structures are unremarkable aside from prior lens extractions. Other: Debris in the right external auditory canal. Bilateral TMJ arthrosis. IMPRESSION: 1. No acute intracranial abnormality. 2. Stable appearance of right frontal encephalomalacia and bilateral lacunar type infarcts in the basal ganglia and cerebellar hemispheres. Expected evolution of encephalomalacia along the right inferior cerebellum corresponding with a presumed subacute infarct on comparison CT. 3. Chronic microvascular angiopathy and parenchymal volume loss. 4. Debris in the right external auditory canal, correlate for cerumen impaction. Electronically Signed   By: Lovena Le M.D.   On: 10/18/2020  23:54   MR BRAIN WO CONTRAST  Result Date: 10/21/2020 CLINICAL DATA:  Neuro deficit, acute stroke suspected. EXAM: MRI HEAD WITHOUT CONTRAST TECHNIQUE: Multiplanar, multiecho pulse sequences of the brain and surrounding structures were obtained without intravenous contrast. COMPARISON:  CT head October 18, 2020. FINDINGS: Brain: Small acute infarct in the right thalamus. Mild associated edema without mass effect. No acute hemorrhage. Remote infarcts in bilateral cerebellar hemispheres, bilateral basal ganglia, and right frontal white matter. There is ex vacuo dilation of the fourth ventricle and lateral ventricles. Additional scattered T2/FLAIR hyperintensities within the white matter are likely the sequela of chronic microvascular ischemic disease. Vascular: Major arterial flow voids are maintained at the skull base. Skull and upper cervical spine: Normal marrow signal. Sinuses/Orbits: Atretic left maxillary  sinus with mild scattered paranasal sinus mucosal thickening. Unremarkable orbits. Other: No mastoid effusions. IMPRESSION: 1. Small acute infarct in the right thalamus. 2. Multiple remote lacunar infarcts, chronic microvascular ischemic disease, and generalized cerebral volume loss. Electronically Signed   By: Feliberto Harts MD   On: 10/21/2020 14:32   DG Chest Port 1 View  Result Date: 10/18/2020 CLINICAL DATA:  Dizziness EXAM: PORTABLE CHEST 1 VIEW COMPARISON:  None. FINDINGS: The heart size and mediastinal contours are borderline enlarged. Aortic knob calcifications are seen. A right-sided pacemaker seen with the lead tips in the right atrium right ventricle. Both lungs are clear. The visualized skeletal structures are unremarkable. Surgical clips seen within the left axilla. IMPRESSION: No active disease. Electronically Signed   By: Jonna Clark M.D.   On: 10/18/2020 23:40   ECHOCARDIOGRAM COMPLETE  Result Date: 10/22/2020    ECHOCARDIOGRAM REPORT   Patient Name:   Sitlaly Gault Date of Exam: 10/22/2020 Medical Rec #:  [id number]     Height:       67.0 in Accession #:    [id number]    Weight:       150.1 lb Date of Birth:  March 20, 1946     BSA:          1.790 m Patient Age:    74 years      BP:           161/69 mmHg Patient Gender: F             HR:           60 bpm. Exam Location:  Inpatient Procedure: 2D Echo, 3D Echo, Cardiac Doppler and Color Doppler Indications:    Stroke  History:        Patient has no prior history of Echocardiogram examinations.                 Pacemaker and Abnormal ECG, Stroke,                 Signs/Symptoms:Dizziness/Lightheadedness; Risk                 Factors:Hypertension and Diabetes. ESRD. Breast cancer.  Sonographer:    Sheralyn Boatman RDCS Referring Phys: [id number] [facility]  Sonographer Comments: Image acquisition challenging due to mastectomy. IMPRESSIONS  1. There is severe asymmetric hypertrophy of the apical left ventricular myocardium. Findings suggest apical variant  hypertrophic cardiomyopathy. Left ventricular ejection fraction, by estimation, is 60 to 65%. The left ventricle has normal function. The left ventricle has no regional wall motion abnormalities. There is moderate concentric left ventricular hypertrophy as well. Left ventricular diastolic parameters are consistent with Grade I diastolic dysfunction (impaired relaxation). Elevated left atrial pressure.  2. Right ventricular  systolic function is normal. The right ventricular size is normal. Mildly increased right ventricular wall thickness. There is moderately elevated pulmonary artery systolic pressure.  3. Left atrial size was mildly dilated.  4. Right atrial size was mildly dilated.  5. The mitral valve is normal in structure. Mild mitral valve regurgitation.  6. Tricuspid valve regurgitation is moderate.  7. The aortic valve is tricuspid. There is mild calcification of the aortic valve. There is mild thickening of the aortic valve. Aortic valve regurgitation is not visualized. Mild aortic valve sclerosis is present, with no evidence of aortic valve stenosis.  8. There is Moderate (Grade III) protruding plaque involving the transverse aorta.  9. The inferior vena cava is dilated in size with <50% respiratory variability, suggesting right atrial pressure of 15 mmHg. FINDINGS  Left Ventricle: There is severe asymmetric hypertrophy of the apical left ventricular myocardium. Findings suggest apical variant hypertrophic cardiomyopathy. Left ventricular ejection fraction, by estimation, is 60 to 65%. The left ventricle has normal  function. The left ventricle has no regional wall motion abnormalities. The left ventricular internal cavity size was normal in size. There is moderate concentric left ventricular hypertrophy. Left ventricular diastolic parameters are consistent with Grade I diastolic dysfunction (impaired relaxation). Elevated left atrial pressure. Right Ventricle: The right ventricular size is normal. Mildly  increased right ventricular wall thickness. Right ventricular systolic function is normal. There is moderately elevated pulmonary artery systolic pressure. The tricuspid regurgitant velocity is 3.02 m/s, and with an assumed right atrial pressure of 15 mmHg, the estimated right ventricular systolic pressure is 54.6 mmHg. Left Atrium: Left atrial size was mildly dilated. Right Atrium: Right atrial size was mildly dilated. Pericardium: There is no evidence of pericardial effusion. Mitral Valve: The mitral valve is normal in structure. Mild mitral valve regurgitation, with centrally-directed jet. Tricuspid Valve: The tricuspid valve is normal in structure. Tricuspid valve regurgitation is moderate. Aortic Valve: The aortic valve is tricuspid. There is mild calcification of the aortic valve. There is mild thickening of the aortic valve. Aortic valve regurgitation is not visualized. Mild aortic valve sclerosis is present, with no evidence of aortic valve stenosis. Pulmonic Valve: The pulmonic valve was grossly normal. Pulmonic valve regurgitation is not visualized. Aorta: The aortic root and ascending aorta are structurally normal, with no evidence of dilitation. There is moderate (Grade III) protruding plaque involving the transverse aorta. Venous: The inferior vena cava is dilated in size with less than 50% respiratory variability, suggesting right atrial pressure of 15 mmHg. IAS/Shunts: No atrial level shunt detected by color flow Doppler. Additional Comments: A pacer wire is visualized.  LEFT VENTRICLE PLAX 2D LVIDd:         4.50 cm     Diastology LVIDs:         2.70 cm     LV e' medial:    4.47 cm/s LV PW:         2.00 cm     LV E/e' medial:  17.6 LV IVS:        1.60 cm     LV e' lateral:   6.05 cm/s LVOT diam:     1.60 cm     LV E/e' lateral: 13.0 LV SV:         58 LV SV Index:   33 LVOT Area:     2.01 cm  LV Volumes (MOD) LV vol d, MOD A2C: 82.9 ml LV vol d, MOD A4C: 86.8 ml LV vol s, MOD A2C: 22.6 ml  LV vol s,  MOD A4C: 23.2 ml LV SV MOD A2C:     60.3 ml LV SV MOD A4C:     86.8 ml LV SV MOD BP:      62.1 ml RIGHT VENTRICLE             IVC RV S prime:     11.10 cm/s  IVC diam: 2.40 cm TAPSE (M-mode): 1.8 cm LEFT ATRIUM             Index       RIGHT ATRIUM           Index LA diam:        3.90 cm 2.18 cm/m  RA Area:     18.60 cm LA Vol (A2C):   61.3 ml 34.24 ml/m RA Volume:   60.80 ml  33.96 ml/m LA Vol (A4C):   51.2 ml 28.60 ml/m LA Biplane Vol: 55.3 ml 30.89 ml/m  AORTIC VALVE LVOT Vmax:   117.00 cm/s LVOT Vmean:  81.100 cm/s LVOT VTI:    0.290 m  AORTA Ao Root diam: 3.20 cm Ao Asc diam:  2.70 cm MITRAL VALVE                TRICUSPID VALVE MV Area (PHT): 3.48 cm     TR Peak grad:   36.5 mmHg MV Decel Time: 218 msec     TR Vmax:        302.00 cm/s MV E velocity: 78.80 cm/s MV A velocity: 133.00 cm/s  SHUNTS MV E/A ratio:  0.59         Systemic VTI:  0.29 m                             Systemic Diam: 1.60 cm Dani Gobble Croitoru MD Electronically signed by Sanda Klein MD Signature Date/Time: 10/22/2020/3:12:57 PM    Final    Intravitreal Injection, Pharmacologic Agent - OD - Right Eye  Result Date: 10/03/2020 Time Out 10/03/2020. 10:24 AM. Confirmed correct patient, procedure, site, and patient consented. Anesthesia Topical anesthesia was used. Anesthetic medications included Lidocaine 2%, Proparacaine 0.5%. Procedure Preparation included 5% betadine to ocular surface, eyelid speculum. A (32g) needle was used. Injection: 2 mg aflibercept Alfonse Flavors) SOLN   NDC: A3590391, Lot: [id number], Expiration date: 12/14/2020   Route: Intravitreal, Site: Right Eye, Waste: 0.05 mL Post-op Post injection exam found visual acuity of at least counting fingers. The patient tolerated the procedure well. There were no complications. The patient received written and verbal post procedure care education. Post injection medications were not given. Notes An AC tap was performed following injection due to elevated IOP using a 30 gauge needle  on a syringe with the plunger removed. The needle was placed at the limbus at 7 oclock and approximately 0.08cc of aqueous was removed from the anterior chamber. Betadine was applied to the tap area before and after the paracentesis was performed. There were no complications. The patient tolerated the procedure well. The IOP was rechecked and was found to be ~8 mmHg by palpation.   Intravitreal Injection, Pharmacologic Agent - OS - Left Eye  Result Date: 10/03/2020 Time Out 10/03/2020. 10:25 AM. Confirmed correct patient, procedure, site, and patient consented. Anesthesia Topical anesthesia was used. Anesthetic medications included Lidocaine 2%, Proparacaine 0.5%. Procedure Preparation included 5% betadine to ocular surface, eyelid speculum. A (32g) needle was used. Injection: 2 mg aflibercept Alfonse Flavors) SOLN   NDC: A3590391, Lot: [id number], Expiration  date: 08/14/2021   Route: Intravitreal, Site: Left Eye, Waste: 0.05 mL Post-op Post injection exam found visual acuity of at least counting fingers. The patient tolerated the procedure well. There were no complications. The patient received written and verbal post procedure care education. Post injection medications were not given.   OCT, Retina - OU - Both Eyes  Result Date: 10/03/2020 Right Eye Quality was good. Central Foveal Thickness: 485. Progression has worsened. Findings include abnormal foveal contour, intraretinal fluid, intraretinal hyper-reflective material, no SRF, outer retinal atrophy, epiretinal membrane, macular pucker (Interval increase in central IRF). Left Eye Quality was good. Central Foveal Thickness: 855. Progression has improved. Findings include abnormal foveal contour, intraretinal fluid, epiretinal membrane, no SRF, preretinal fibrosis, vitreous traction (Interval improvement in subhyaloid heme and vitreous opacities, persistent traction). 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 CME - cystoid macular edema. PED - pigment epithelial detachment. IRF - intraretinal fluid. SRF - subretinal fluid. EZ - ellipsoid zone. ERM - epiretinal membrane. ORA - outer retinal atrophy. ORT - outer retinal tubulation. SRHM - subretinal hyper-reflective material   VAS US CAROTID  Result Date: 10/23/2020 Carotid Arterial Duplex Study Indications:       CVA. Risk Factors:      Hypertension, Diabetes. Comparison Study:  No prior study Performing Technologist: Maudry Mayhew MHA, RDMS, RVT, RDCS  Examination Guidelines: A complete evaluation includes B-mode imaging, spectral Doppler, color Doppler, and power Doppler as needed of all accessible portions of each vessel. Bilateral testing is considered an integral part of a complete examination. Limited examinations for reoccurring indications may be performed as noted.  Right Carotid Findings: +----------+--------+--------+--------+-------------------------+--------+           PSV cm/sEDV cm/sStenosisPlaque Description       Comments +----------+--------+--------+--------+-------------------------+--------+ CCA Prox  88      16              hyperechoic and irregular         +----------+--------+--------+--------+-------------------------+--------+ CCA Distal84      14              smooth and heterogenous           +----------+--------+--------+--------+-------------------------+--------+ ICA Prox  50      11              smooth and homogeneous            +----------+--------+--------+--------+-------------------------+--------+ ICA Distal76      23                                                +----------+--------+--------+--------+-------------------------+--------+ ECA       73      7                                                  +----------+--------+--------+--------+-------------------------+--------+ +----------+--------+-------+----------------+-------------------+           PSV cm/sEDV cmsDescribe        Arm Pressure (mmHG) +----------+--------+-------+----------------+-------------------+ [id number]  Multiphasic, WNL                    +----------+--------+-------+----------------+-------------------+ +---------+--------+--+--------+--+---------+ VertebralPSV cm/s39EDV cm/s14Antegrade +---------+--------+--+--------+--+---------+  Left Carotid Findings: +----------+--------+--------+--------+-----------------------+--------+           PSV cm/sEDV cm/sStenosisPlaque Description     Comments +----------+--------+--------+--------+-----------------------+--------+ CCA Prox  91      19                                              +----------+--------+--------+--------+-----------------------+--------+ CCA Distal82      18              smooth and heterogenous         +----------+--------+--------+--------+-----------------------+--------+ ICA Prox  62      12              heterogenous                    +----------+--------+--------+--------+-----------------------+--------+ ICA Distal61      20                                              +----------+--------+--------+--------+-----------------------+--------+ ECA       98      12                                              +----------+--------+--------+--------+-----------------------+--------+ +----------+--------+--------+----------------+-------------------+           PSV cm/sEDV cm/sDescribe        Arm Pressure (mmHG) +----------+--------+--------+----------------+-------------------+ [id number]             Multiphasic, WNL                    +----------+--------+--------+----------------+-------------------+ +---------+--------+--+--------+--+---------+ VertebralPSV cm/s81EDV  cm/s19Antegrade +---------+--------+--+--------+--+---------+   Summary: Right Carotid: Velocities in the right ICA are consistent with a 1-39% stenosis. Left Carotid: Velocities in the left ICA are consistent with a 1-39% stenosis. Vertebrals:  Bilateral vertebral arteries demonstrate antegrade flow. Subclavians: Normal flow hemodynamics were seen in bilateral subclavian              arteries. *See table(s) above for measurements and observations.  Electronically signed by Antony Contras MD on 10/23/2020 at 1:31:21 PM.    Final    VAS [place] TRANSCRANIAL DOPPLER  Result Date: 10/23/2020  Transcranial Doppler Indications: Stroke. History: Hypertension, diabetes mellitus. Limitations: Suboptimal acoustic windows Comparison Study: No prior study Performing Technologist: Darlin Coco, RDMS Supporting Technologist: Maudry Mayhew MHA, RDMS, RVT, RDCS  Examination Guidelines: A complete evaluation includes B-mode imaging, spectral Doppler, color Doppler, and power Doppler as needed of all accessible portions of each vessel. Bilateral testing is considered an integral part of a complete examination. Limited examinations for reoccurring indications may be performed as noted.  +----------+-------------+----------+-----------+-------+ RIGHT TCD Right VM (cm)Depth (cm)PulsatilityComment +----------+-------------+----------+-----------+-------+ MCA           85.00                 1.34            +----------+-------------+----------+-----------+-------+ ACA          -28.00  1.17            +----------+-------------+----------+-----------+-------+ Term ICA      28.00                 1.43            +----------+-------------+----------+-----------+-------+ PCA           32.00                 1.32            +----------+-------------+----------+-----------+-------+ Opthalmic     15.00                 1.56            +----------+-------------+----------+-----------+-------+  ICA siphon    14.00                 1.58            +----------+-------------+----------+-----------+-------+ Vertebral    -20.00                 1.34            +----------+-------------+----------+-----------+-------+  +----------+------------+----------+-----------+------------------+ LEFT TCD  Left VM (cm)Depth (cm)Pulsatility     Comment       +----------+------------+----------+-----------+------------------+ MCA          73.00                 1.26                       +----------+------------+----------+-----------+------------------+ ACA                                        Unable to insonate +----------+------------+----------+-----------+------------------+ Term ICA     38.00                 1.31                       +----------+------------+----------+-----------+------------------+ PCA          46.00                 1.59                       +----------+------------+----------+-----------+------------------+ Opthalmic    18.00                 1.90                       +----------+------------+----------+-----------+------------------+ ICA siphon   45.00                 1.27                       +----------+------------+----------+-----------+------------------+ Vertebral    -30.00                1.42                       +----------+------------+----------+-----------+------------------+  +------------+-------+------------------+             VM cm/s     Comment       +------------+-------+------------------+ Prox Basilar       Unable to insonate +------------+-------+------------------+ Summary:  Elevated bilateral middle cerebral arteries of unclear significance. Globally elevate dpulsatility indices suggest diffuse  intracranial atherosclerosis. *See table(s) above for TCD measurements and observations.  Diagnosing physician: Antony Contras MD Electronically signed by Antony Contras MD on 10/23/2020 at 1:32:56 PM.    Final       TODAY-DAY OF DISCHARGE:  Subjective:   Desiree Pollard today has no headache,no chest abdominal pain,no new weakness tingling or numbness, feels much better wants to go home today.   Objective:   Blood pressure 139/61, pulse 67, temperature 98 F (36.7 C), temperature source Oral, resp. rate 18, height $RemoveBe'5\' 7"'FfFweGvON$  (1.702 m), weight 68.1 kg, SpO2 100 %.  Intake/Output Summary (Last 24 hours) at 10/28/2020 0937 Last data filed at 10/28/2020 0800 Gross per 24 hour  Intake 482 ml  Output --  Net 482 ml   Filed Weights   10/18/20 2325 10/19/20 1106  Weight: 70.3 kg 68.1 kg    Exam: Awake Alert, Oriented *3, No new F.N deficits, Normal affect [place].AT,PERRAL Supple Neck,No JVD, No cervical lymphadenopathy appriciated.  Symmetrical Chest wall movement, Good air movement bilaterally, CTAB RRR,No Gallops,Rubs or new Murmurs, No Parasternal Heave +ve B.Sounds, Abd Soft, Non tender, No organomegaly appriciated, No rebound -guarding or rigidity. No Cyanosis, Clubbing or edema, No new Rash or bruise   PERTINENT RADIOLOGIC STUDIES: No results found.   PERTINENT LAB RESULTS: CBC: Recent Labs    10/26/20 0248  WBC 6.5  HGB 7.7*  HCT 24.8*  PLT 149*   CMET CMP     Component Value Date/Time   NA 145 10/22/2020 0918   NA 143 08/07/2020 1559   K 4.1 10/22/2020 0918   CL 118 (H) 10/22/2020 0918   CO2 19 (L) 10/22/2020 0918   GLUCOSE 152 (H) 10/22/2020 0918   BUN 28 (H) 10/22/2020 0918   BUN 27 08/07/2020 1559   CREATININE 2.82 (H) 10/22/2020 0918   CALCIUM 8.9 10/22/2020 0918   PROT 7.8 08/07/2020 1559   ALBUMIN 4.4 08/07/2020 1559   AST 15 08/07/2020 1559   ALT 10 08/07/2020 1559   ALKPHOS 109 08/07/2020 1559   BILITOT 0.2 08/07/2020 1559   GFRNONAA 17 (L) 10/22/2020 0918   GFRAA 20 (L) 08/07/2020 1559    GFR Estimated Creatinine Clearance: 16.5 mL/min (A) (by C-G formula based on SCr of 2.82 mg/dL (H)). No results for input(s): LIPASE, AMYLASE in the last 72  hours. No results for input(s): CKTOTAL, CKMB, CKMBINDEX, TROPONINI in the last 72 hours. Invalid input(s): POCBNP No results for input(s): DDIMER in the last 72 hours. No results for input(s): HGBA1C in the last 72 hours. No results for input(s): CHOL, HDL, LDLCALC, TRIG, CHOLHDL, LDLDIRECT in the last 72 hours. No results for input(s): TSH, T4TOTAL, T3FREE, THYROIDAB in the last 72 hours.  Invalid input(s): FREET3 No results for input(s): VITAMINB12, FOLATE, FERRITIN, TIBC, IRON, RETICCTPCT in the last 72 hours. Coags: No results for input(s): INR in the last 72 hours.  Invalid input(s): PT Microbiology: Recent Results (from the past 240 hour(s))  Respiratory Panel by RT PCR (Flu A&B, Covid) - Nasopharyngeal Swab     Status: None   Collection Time: 10/19/20  5:40 AM   Specimen: Nasopharyngeal Swab  Result Value Ref Range Status   SARS Coronavirus 2 by RT PCR NEGATIVE NEGATIVE Final    Comment: (NOTE) SARS-CoV-2 target nucleic acids are NOT DETECTED.  The SARS-CoV-2 RNA is generally detectable in upper respiratoy specimens during the acute phase of infection. The lowest concentration of SARS-CoV-2 viral copies this assay can detect is 131 copies/mL. A negative result does  not preclude SARS-Cov-2 infection and should not be used as the sole basis for treatment or other patient management decisions. A negative result may occur with  improper specimen collection/handling, submission of specimen other than nasopharyngeal swab, presence of viral mutation(s) within the areas targeted by this assay, and inadequate number of viral copies (<131 copies/mL). A negative result must be combined with clinical observations, patient history, and epidemiological information. The expected result is Negative.  Fact Sheet for Patients:  https://www.moore.com/  Fact Sheet for Healthcare Providers:  https://www.young.biz/  This test is no t yet approved or  cleared by the [place] FDA and  has been authorized for detection and/or diagnosis of SARS-CoV-2 by FDA under an Emergency Use Authorization (EUA). This EUA will remain  in effect (meaning this test can be used) for the duration of the COVID-19 declaration under Section 564(b)(1) of the Act, 21 U.S.C. section 360bbb-3(b)(1), unless the authorization is terminated or revoked sooner.     Influenza A by PCR NEGATIVE NEGATIVE Final   Influenza B by PCR NEGATIVE NEGATIVE Final    Comment: (NOTE) The Xpert Xpress SARS-CoV-2/FLU/RSV assay is intended as an aid in  the diagnosis of influenza from Nasopharyngeal swab specimens and  should not be used as a sole basis for treatment. Nasal washings and  aspirates are unacceptable for Xpert Xpress SARS-CoV-2/FLU/RSV  testing.  Fact Sheet for Patients: https://www.moore.com/  Fact Sheet for Healthcare Providers: https://www.young.biz/  This test is not yet approved or cleared by the [place] FDA and  has been authorized for detection and/or diagnosis of SARS-CoV-2 by  FDA under an Emergency Use Authorization (EUA). This EUA will remain  in effect (meaning this test can be used) for the duration of the  Covid-19 declaration under Section 564(b)(1) of the Act, 21  U.S.C. section 360bbb-3(b)(1), unless the authorization is  terminated or revoked. Performed at [facility], [address], [place], [place] [address]   Respiratory Panel by RT PCR (Flu A&B, Covid) - Nasopharyngeal Swab     Status: None   Collection Time: 10/25/20 12:19 PM   Specimen: Nasopharyngeal Swab  Result Value Ref Range Status   SARS Coronavirus 2 by RT PCR NEGATIVE NEGATIVE Final    Comment: (NOTE) SARS-CoV-2 target nucleic acids are NOT DETECTED.  The SARS-CoV-2 RNA is generally detectable in upper respiratoy specimens during the acute phase of infection. The lowest concentration of SARS-CoV-2 viral copies this  assay can detect is 131 copies/mL. A negative result does not preclude SARS-Cov-2 infection and should not be used as the sole basis for treatment or other patient management decisions. A negative result may occur with  improper specimen collection/handling, submission of specimen other than nasopharyngeal swab, presence of viral mutation(s) within the areas targeted by this assay, and inadequate number of viral copies (<131 copies/mL). A negative result must be combined with clinical observations, patient history, and epidemiological information. The expected result is Negative.  Fact Sheet for Patients:  https://www.moore.com/  Fact Sheet for Healthcare Providers:  https://www.young.biz/  This test is no t yet approved or cleared by the [place] FDA and  has been authorized for detection and/or diagnosis of SARS-CoV-2 by FDA under an Emergency Use Authorization (EUA). This EUA will remain  in effect (meaning this test can be used) for the duration of the COVID-19 declaration under Section 564(b)(1) of the Act, 21 U.S.C. section 360bbb-3(b)(1), unless the authorization is terminated or revoked sooner.     Influenza A by PCR NEGATIVE  NEGATIVE Final   Influenza B by PCR NEGATIVE NEGATIVE Final    Comment: (NOTE) The Xpert Xpress SARS-CoV-2/FLU/RSV assay is intended as an aid in  the diagnosis of influenza from Nasopharyngeal swab specimens and  should not be used as a sole basis for treatment. Nasal washings and  aspirates are unacceptable for Xpert Xpress SARS-CoV-2/FLU/RSV  testing.  Fact Sheet for Patients: PinkCheek.be  Fact Sheet for Healthcare Providers: GravelBags.it  This test is not yet approved or cleared by the [place] FDA and  has been authorized for detection and/or diagnosis of SARS-CoV-2 by  FDA under an Emergency Use Authorization (EUA). This EUA will  remain  in effect (meaning this test can be used) for the duration of the  Covid-19 declaration under Section 564(b)(1) of the Act, 21  U.S.C. section 360bbb-3(b)(1), unless the authorization is  terminated or revoked. Performed at [facility], [place] [address] [place], [place] [address]   Culture, Urine     Status: Abnormal   Collection Time: 10/25/20  3:29 PM   Specimen: Urine, Random  Result Value Ref Range Status   Specimen Description URINE, RANDOM  Final   Special Requests   Final    NONE Performed at [facility], [place] [address], [place],  [address]    Culture >=100,000 COLONIES/mL ESCHERICHIA COLI (A)  Final   Report Status 10/28/2020 FINAL  Final   Organism ID, Bacteria ESCHERICHIA COLI (A)  Final      Susceptibility   Escherichia coli - MIC*    AMPICILLIN >=32 RESISTANT Resistant     CEFAZOLIN <=4 SENSITIVE Sensitive     CEFEPIME <=0.12 SENSITIVE Sensitive     CEFTRIAXONE <=0.25 SENSITIVE Sensitive     CIPROFLOXACIN <=0.25 SENSITIVE Sensitive     GENTAMICIN <=1 SENSITIVE Sensitive     IMIPENEM <=0.25 SENSITIVE Sensitive     NITROFURANTOIN <=16 SENSITIVE Sensitive     TRIMETH/SULFA >=320 RESISTANT Resistant     AMPICILLIN/SULBACTAM 16 INTERMEDIATE Intermediate     PIP/TAZO <=4 SENSITIVE Sensitive     * >=100,000 COLONIES/mL ESCHERICHIA COLI    FURTHER DISCHARGE INSTRUCTIONS:  Get Medicines reviewed and adjusted: Please take all your medications with you for your next visit with your Primary MD  Laboratory/radiological data: Please request your Primary MD to go over all hospital tests and procedure/radiological results at the follow up, please ask your Primary MD to get all Hospital records sent to his/her office.  In some cases, they will be blood work, cultures and biopsy results pending at the time of your discharge. Please request that your primary care M.D. goes through all the records of your hospital data and follows up on these  results.  Also Note the following: If you experience worsening of your admission symptoms, develop shortness of breath, life threatening emergency, suicidal or homicidal thoughts you must seek medical attention immediately by calling 911 or calling your MD immediately  if symptoms less severe.  You must read complete instructions/literature along with all the possible adverse reactions/side effects for all the Medicines you take and that have been prescribed to you. Take any new Medicines after you have completely understood and accpet all the possible adverse reactions/side effects.   Do not drive when taking Pain medications or sleeping medications (Benzodaizepines)  Do not take more than prescribed Pain, Sleep and Anxiety Medications. It is not advisable to combine anxiety,sleep and pain medications without talking with your primary care practitioner  Special Instructions: If you have  smoked or chewed Tobacco  in the last 2 yrs please stop smoking, stop any regular Alcohol  and or any Recreational drug use.  Wear Seat belts while driving.  Please note: You were cared for by a hospitalist during your hospital stay. Once you are discharged, your primary care physician will handle any further medical issues. Please note that NO REFILLS for any discharge medications will be authorized once you are discharged, as it is imperative that you return to your primary care physician (or establish a relationship with a primary care physician if you do not have one) for your post hospital discharge needs so that they can reassess your need for medications and monitor your lab values.  Total Time spent coordinating discharge including counseling, education and face to face time equals 35 minutes.  SignedOren Binet 10/28/2020 9:37 AM

## 2020-10-28 NOTE — Plan of Care (Signed)
  Problem: Education: Goal: Knowledge of General Education information will improve Description Including pain rating scale, medication(s)/side effects and non-pharmacologic comfort measures Outcome: Progressing   

## 2020-10-28 NOTE — Progress Notes (Signed)
Physical Therapy Treatment Patient Details Name: Desiree Pollard MRN: [id number] DOB: [birth year] Today's Date: 10/28/2020    History of Present Illness Desiree Pollard is a 74 y.o. female with history of chronic kidney disease stage IV baseline creatinine around 2.5, complete heart block status post pacemaker placement, history of breast cancer in remission, diabetes mellitus type 2, hypertension, chronic anemia admitted due to experiencing dizziness.  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  Patient blood work also shows new thrombocytopenia with mild worsening of anemia from usual around 10 to 8.    PT Comments    Pt reporting fatigue, but agreeable to OOB mobility. Pt tolerated repeated standing trials from EOB well, but demonstrated significant difficulty with dynamic standing tasks (lateral weight shifts, stepping). Pt overall requires mod assist for mobility tasks at this time. SNF remains appropriate d/c venue, will continue to follow while acute.     Follow Up Recommendations  SNF;Supervision/Assistance - 24 hour     Equipment Recommendations  None recommended by PT    Recommendations for Other Services       Precautions / Restrictions Precautions Precautions: Fall Restrictions Weight Bearing Restrictions: No    Mobility  Bed Mobility Overal bed mobility: Needs Assistance Bed Mobility: Rolling;Sidelying to Sit;Sit to Supine Rolling: Mod assist Sidelying to sit: Mod assist;HOB elevated   Sit to supine: Mod assist;HOB elevated   General bed mobility comments: Mod assist for rolling for truncal translation, mod assist supine<>sit for trunk and LE management, scooting to and from EOB.  Transfers Overall transfer level: Needs assistance Equipment used: Rolling walker (2 wheeled) Transfers: Sit to/from Stand Sit to Stand: Mod assist;From elevated surface Stand pivot transfers: Max assist;From elevated  surface       General transfer comment: Mod assist for power up, blocking LLE to prevent anterior sliding, and steadying upon standing. L lateral leaning with prolonged standing >15 seconds. Standing trials x2, able to take 2 lateral steps towards [facility] with max assist for weight shifting L and R, moving RW, translating pt trunk.  Ambulation/Gait             General Gait Details: unable this day   Stairs             Wheelchair Mobility    Modified Rankin (Stroke Patients Only) Modified Rankin (Stroke Patients Only) Pre-Morbid Rankin Score: Moderate disability Modified Rankin: Moderately severe disability     Balance Overall balance assessment: Needs assistance Sitting-balance support: Feet supported;No upper extremity supported Sitting balance-Leahy Scale: Fair Sitting balance - Comments: able to sit EOB with superviison to min guard when able to use at least RUE on bedrail   Standing balance support: Bilateral upper extremity supported Standing balance-Leahy Scale: Poor Standing balance comment: reliant on external support, standing tolerance x1 min.                            Cognition Arousal/Alertness: Awake/alert Behavior During Therapy: Flat affect Overall Cognitive Status: Impaired/Different from baseline Area of Impairment: Safety/judgement;Problem solving;Following commands;Attention                   Current Attention Level: Selective   Following Commands: Follows multi-step commands consistently;Follows multi-step commands with increased time Safety/Judgement: Decreased awareness of safety;Decreased awareness of deficits   Problem Solving: Slow processing;Decreased initiation;Difficulty sequencing;Requires verbal cues General Comments: Pt follows multi-step command when verified by pt stating directions back to  PT "you want me to stand up, take two steps towards the head of the bed, and sit down?".      Exercises      General  Comments General comments (skin integrity, edema, etc.): soiled in stool upon PT arrival, requires pericare assist to clean up      Pertinent Vitals/Pain Pain Assessment: Faces Faces Pain Scale: No hurt Pain Intervention(s): Limited activity within patient's tolerance;Monitored during session    Home Living                      Prior Function            PT Goals (current goals can now be found in the care plan section) Acute Rehab PT Goals Patient Stated Goal: to get more indep.  PT Goal Formulation: With patient Time For Goal Achievement: 11/03/20 Potential to Achieve Goals: Good Progress towards PT goals: Progressing toward goals    Frequency    Min 3X/week      PT Plan Current plan remains appropriate    Co-evaluation              AM-PAC PT "6 Clicks" Mobility   Outcome Measure  Help needed turning from your back to your side while in a flat bed without using bedrails?: A Little Help needed moving from lying on your back to sitting on the side of a flat bed without using bedrails?: A Lot Help needed moving to and from a bed to a chair (including a wheelchair)?: A Lot Help needed standing up from a chair using your arms (e.g., wheelchair or bedside chair)?: A Lot Help needed to walk in hospital room?: Total Help needed climbing 3-5 steps with a railing? : Total 6 Click Score: 11    End of Session Equipment Utilized During Treatment: Gait belt Activity Tolerance: Patient tolerated treatment well Patient left: with call bell/phone within reach;in bed;with bed alarm set Nurse Communication: Mobility status (informed nursing of BM) PT Visit Diagnosis: Other abnormalities of gait and mobility (R26.89);Muscle weakness (generalized) (M62.81);Dizziness and giddiness (R42);Unsteadiness on feet (R26.81);Difficulty in walking, not elsewhere classified (R26.2)     Time: 3202-3343 PT Time Calculation (min) (ACUTE ONLY): 25 min  Charges:  $Therapeutic  Activity: 23-37 mins                     Marvin Maenza E, PT Acute Rehabilitation Services Pager [phone]  Office [phone]    Marilynn Ekstein D Elonda Husky 10/28/2020, 3:31 PM

## 2020-10-28 NOTE — Progress Notes (Signed)
Report called to Ray[place] at [facility] facility.

## 2020-10-28 NOTE — Plan of Care (Signed)
°  Problem: Clinical Measurements: Goal: Ability to maintain clinical measurements within normal limits will improve Outcome: Progressing   Problem: Clinical Measurements: Goal: Will remain free from infection Outcome: Progressing   Problem: Clinical Measurements: Goal: Diagnostic test results will improve Outcome: Progressing   Problem: Clinical Measurements: Goal: Respiratory complications will improve Outcome: Progressing   Problem: Clinical Measurements: Goal: Cardiovascular complication will be avoided Outcome: Progressing   Problem: Health Behavior/Discharge Planning: Goal: Ability to manage health-related needs will improve Outcome: Progressing

## 2020-10-28 NOTE — TOC Transition Note (Signed)
Transition of Care [facility]) - CM/SW Discharge Note   Patient Details  Name: Desiree Pollard MRN: [id number] Date of Birth: 1946-02-09  [facility] CM/SW Contact:  Geralynn Ochs, LCSW Phone Number: 10/28/2020, 3:52 PM   Clinical Narrative:   Nurse to call report to [phone].    Final next level of care: Skilled Nursing Facility Barriers to Discharge: Barriers Resolved   Patient Goals and CMS Choice Patient states their goals for this hospitalization and ongoing recovery are:: Pt is agreeable to SNF placement. CMS Medicare.gov Compare Post Acute Care list provided to:: Patient Choice offered to / list presented to : Patient  Discharge Placement              Patient chooses bed at: [facility] Patient to be transferred to facility by: [facility] Name of family member notified: [name], sent a text message (didn't answer, unable to leave a voicemail) Patient and family notified of of transfer: 10/28/20  Discharge Plan and Services                                     Social Determinants of Health (SDOH) Interventions     Readmission Risk Interventions No flowsheet data found.

## 2020-10-28 NOTE — Plan of Care (Signed)
Adequate for discharge. Belongings went with patient.

## 2020-10-29 ENCOUNTER — Encounter: Payer: Medicare HMO | Admitting: Gastroenterology

## 2020-10-31 ENCOUNTER — Encounter (INDEPENDENT_AMBULATORY_CARE_PROVIDER_SITE_OTHER): Payer: Self-pay | Admitting: Ophthalmology

## 2020-10-31 DIAGNOSIS — H3581 Retinal edema: Secondary | ICD-10-CM

## 2020-10-31 DIAGNOSIS — E113513 Type 2 diabetes mellitus with proliferative diabetic retinopathy with macular edema, bilateral: Secondary | ICD-10-CM

## 2020-10-31 DIAGNOSIS — I10 Essential (primary) hypertension: Secondary | ICD-10-CM

## 2020-10-31 DIAGNOSIS — H401113 Primary open-angle glaucoma, right eye, severe stage: Secondary | ICD-10-CM

## 2020-10-31 DIAGNOSIS — Z8673 Personal history of transient ischemic attack (TIA), and cerebral infarction without residual deficits: Secondary | ICD-10-CM

## 2020-10-31 DIAGNOSIS — H35033 Hypertensive retinopathy, bilateral: Secondary | ICD-10-CM

## 2020-10-31 DIAGNOSIS — H40052 Ocular hypertension, left eye: Secondary | ICD-10-CM

## 2020-10-31 DIAGNOSIS — H43822 Vitreomacular adhesion, left eye: Secondary | ICD-10-CM

## 2020-10-31 DIAGNOSIS — Z961 Presence of intraocular lens: Secondary | ICD-10-CM

## 2020-11-13 NOTE — Progress Notes (Signed)
[facility] Note  11/15/2020     CHIEF COMPLAINT Patient presents for Retina Follow Up   HISTORY OF PRESENT ILLNESS: Desiree Pollard is a 74 y.o. female who presents to the clinic today for:   HPI    Retina Follow Up    Patient presents with  Diabetic Retinopathy.  In both eyes.  This started 4 weeks ago.  I, the attending physician,  performed the HPI with the patient and updated documentation appropriately.          Comments    Patient here for 4 weeks for retina follow up for PDR OU. Patient states vision no better. No eye pain today. Has had eye pain.        Last edited by Bernarda Caffey, MD on 11/15/2020 10:32 PM. (History)    pt is s/p SLT OD ([facility]), pt has not had a follow up since then bc she had a stroke and has been in rehab, sister states the stroke happened around the last week of October, she states the stroke affected her speech and mobility and she is still working to regain both of those  Referring physician: No referring provider defined for this encounter.  HISTORICAL INFORMATION:   Selected notes from the MEDICAL RECORD NUMBER Referred by Dr. Angelena Form for DM exam LEE: 02.04.20 (A. Hager) [BCVA: OD: 20/400 OS: 20/150] Ocular Hx-ptosis OU, pseudo OU, diabetic ret OU PMH-hx of stroke (1 month), cancer, DM    CURRENT MEDICATIONS: Current Outpatient Medications (Ophthalmic Drugs)  Medication Sig  . dorzolamide-timolol (COSOPT) 22.3-6.8 MG/ML ophthalmic solution INSTILL 1 DROP INTO BOTH EYES TWICE A DAY (Patient taking differently: Place 1 drop into the right eye 2 (two) times daily. )   No current facility-administered medications for this visit. (Ophthalmic Drugs)   Current Outpatient Medications (Other)  Medication Sig  . Accu-Chek FastClix Lancets MISC 1 each by Other route as directed. To test blood glucose daily.  Marland Kitchen atorvastatin (LIPITOR) 80 MG tablet Take 80 mg by mouth daily.   . Blood Glucose Monitoring Suppl (FIFTY50  GLUCOSE METER 2.0) w/Device KIT 1 each by Other route See admin instructions. Use to check blood sugars  . carvedilol (COREG) 25 MG tablet Take 25 mg by mouth 2 (two) times daily with a meal.   . clopidogrel (PLAVIX) 75 MG tablet Take 1 tablet (75 mg total) by mouth daily.  Marland Kitchen donepezil (ARICEPT) 10 MG tablet Take 10 mg by mouth at bedtime.  . Ferrous Sulfate (IRON PO) Take 1 tablet by mouth daily. 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  . ferrous sulfate 325 (65 FE) MG tablet Take 325 mg by mouth daily with breakfast.  . hydrALAZINE (APRESOLINE) 25 MG tablet Take 1 tablet (25 mg total) by mouth every 8 (eight) hours.  . insulin aspart (NOVOLOG) 100 UNIT/ML injection 0-6 Units, Subcutaneous, 3 times daily with meals CBG < 70: Implement Hypoglycemia measures CBG 70 - 120: 0 units CBG 121 - 150: 0 units CBG 151 - 200: 1 unit CBG 201-250: 2 units CBG 251-300: 3 units CBG 301-350: 4 units CBG 351-400: 5 units CBG > 400: Give 6 units and call MD  . Insulin Pen Needle (FIFTY50 PEN NEEDLES) 32G X 4 MM MISC 1 each by Other route See admin instructions. Use as instructed.  . loperamide (IMODIUM A-D) 2 MG tablet Take 1 tablet (2 mg total) by mouth 4 (four) times daily as needed for diarrhea or loose stools. (Patient not taking: Reported on  10/19/2020)  . NIFEdipine (ADALAT CC) 60 MG 24 hr tablet Take 1 tablet (60 mg total) by mouth daily.  . pantoprazole (PROTONIX) 40 MG tablet Take 1 tablet (40 mg total) by mouth daily.   No current facility-administered medications for this visit. (Other)   REVIEW OF SYSTEMS: ROS    Positive for: Neurological, Musculoskeletal, Endocrine, Cardiovascular, Eyes, Heme/Lymph   Negative for: Constitutional, Gastrointestinal, Skin, Genitourinary, HENT, Respiratory, Psychiatric, Allergic/Imm   Last edited by Theodore Demark, COA on 11/15/2020  2:04 PM. (History)     ALLERGIES Allergies  Allergen Reactions  . Amlodipine Nausea And Vomiting    Pt refuses to take due to severe n/v that  began after starting amlodipine and stopped after discontinuation.   PAST MEDICAL HISTORY Past Medical History:  Diagnosis Date  . Breast cancer ([place])    2017  . CVA (cerebral vascular accident) ([place]) 11/2018  . Dehydration 07/2020  . Diabetes mellitus without complication ([place])   . Diabetic retinopathy ([place])    PDR OU  . Frequent diarrhea 07/2020  . Hyperkalemia 07/2020  . Hypertensive retinopathy    OU  . Stroke [facility])    Past Surgical History:  Procedure Laterality Date  . CATARACT EXTRACTION Right   . MASTECTOMY Bilateral    "2017" Left Mastectomy; "2019" Right Mastectomy  . PACEMAKER IMPLANT  2017   FAMILY HISTORY Family History  Problem Relation Age of Onset  . Ovarian cancer Mother   . Heart disease Father   . Diabetes Sister   . Diabetes Brother   . Colon cancer Neg Hx   . Esophageal cancer Neg Hx   . Pancreatic cancer Neg Hx   . Stomach cancer Neg Hx   . Liver disease Neg Hx    SOCIAL HISTORY Social History   Tobacco Use  . Smoking status: Former Research scientist (life sciences)  . Smokeless tobacco: Never Used  Vaping Use  . Vaping Use: Never used  Substance Use Topics  . Alcohol use: Not Currently  . Drug use: Never         OPHTHALMIC EXAM:  Base Eye Exam    Visual Acuity (Snellen - Linear)      Right Left   Dist [place] LP 20/70 +1   Dist ph Doon NI NI       Tonometry (Tonopen, 2:01 PM)      Right Left   Pressure 10 13       Pupils      Dark Light Shape React APD   Right 2 1 Round Minimal None   Left 2 1 Round Minimal None       Visual Fields (Counting fingers)      Left Right    Full    Restrictions  Total superior temporal, superior nasal, inferior nasal deficiencies       Extraocular Movement      Right Left    Full Full       Neuro/Psych    Oriented x3: Yes   Mood/Affect: Normal       Dilation    Both eyes: 1.0% Mydriacyl, 2.5% Phenylephrine @ 2:01 PM        Slit Lamp and Fundus Exam    Slit Lamp Exam      Right Left   Lids/Lashes  Dermatochalasis - upper lid, nasal Ectropion - lower lid Dermatochalasis - upper lid, Meibomian gland dysfunction, mild, nasal Ectropion, lower lid   Conjunctiva/Sclera nasal and temporal Pinguecula, Melanosis nasal and temporal Pinguecula, Melanosis  Cornea mild Arcus, Well healed cataract wounds mild Arcus, Well healed cataract wounds, 2+ Punctate epithelial erosions, tear film debris   Anterior Chamber deep and clear deep and clear   Iris round and poorly dilated, No NVI Round and moderately dilated to 1m, No NVI   Lens Posterior chamber intraocular lens, trace Posterior capsular opacification Posterior chamber intraocular lens, trace Posterior capsular opacification   Vitreous Vitreous syneresis, mild residual white VH inferiorly Vitreous syneresis       Fundus Exam      Right Left   Disc 3-4+ pallor, sharp rim, +atrophy, +cupping 2+pallor, fine NVD - regressing   C/D Ratio 0.9 0.1   Macula Blunted foveal reflex, scattered MA, +ERM, central edema--increased, atrophic and ischemic Blunted foveal reflex, interval improvement in large subhyaloid boat-shaped heme, persistent edema, scattered IRH   Vessels Severe Vascular attenuation, Tortuous, +fibrosis along ST arcades--improved severe Vascular attenuation, Tortuous, +NV   Periphery Attached, scattered IRH/MA, good 360 PRP fill in Attached, scattered IRH and pre-retinal heme, 360 PRP w/room for fill-in posteriorly          IMAGING AND PROCEDURES  Imaging and Procedures for @TODAY @  OCT, Retina - OU - Both Eyes       Right Eye Quality was good. Central Foveal Thickness: 306. Progression has been stable. Findings include abnormal foveal contour, intraretinal fluid, intraretinal hyper-reflective material, no SRF, outer retinal atrophy, epiretinal membrane, macular pucker, inner retinal atrophy (Interval increase in central IRF).   Left Eye Quality was good. Central Foveal Thickness: 744. Progression has improved. Findings include  abnormal foveal contour, intraretinal fluid, epiretinal membrane, no SRF, preretinal fibrosis, vitreous traction (Mild Interval improvement in subhyaloid heme, persistent traction).   Notes *Images captured and stored on drive  Diagnosis / Impression:  DME OU Mild ERM OU OD: Interval increase in central IRF OS: Interval improvement in subhyaloid heme and vitreous opacities, persistent traction   Clinical management:  See below  Abbreviations: NFP - Normal foveal profile. CME - cystoid macular edema. PED - pigment epithelial detachment. IRF - intraretinal fluid. SRF - subretinal fluid. EZ - ellipsoid zone. ERM - epiretinal membrane. ORA - outer retinal atrophy. ORT - outer retinal tubulation. SRHM - subretinal hyper-reflective material                 ASSESSMENT/PLAN:    ICD-10-CM   1. Proliferative diabetic retinopathy of both eyes with macular edema associated with type 2 diabetes mellitus ([place]  EA07.6226  2. 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 Essential hypertension  I10   5. Hypertensive retinopathy of both eyes  H35.033   6. Pseudophakia of both eyes  Z96.1   7. History of stroke  Z86.73   8. Primary open angle glaucoma (POAG) of right eye, severe stage  H40.1113   9. Ocular hypertension, bilateral  H40.053     1,2. Proliferative diabetic retinopathy w/ DME OU  - lost to f/u from 11/2019 to 06/2020 due to living out of the area in [place] and unable to return  - lost to f/u 3.23.2020 to 7.13.2020 [place]  - history of VH OD onset 2.25.20 -- improved  - at initial presentation - difficult exam due to poor patient cooperation -- history of stroke -- improved now  - initial exam showed central edema OU and scattered IRH; +fine NVD OS; early flat NVE OD  -  FA (08.19.20) shows NV nasal to disc OD; OS with NV along SN and ST arcades  - s/p PRP OS (02.18.20), fill-in  (08.19.20)  - s/p PRP OD (03.23.30), fill-in (09.16.20)  - s/p IVTA OU #1 (02.25.20), #2 (03.23.20) -- lost to f/u due to COVID restrictions  - s/p IVA OU #1 (07.31.20), #2 (09.02.20), #3 (10.07.20), #4 (11.04.20)             - s/p IVA OS #5 (7.27.21)             - s/p IVE OU #1 (12.02.20)  - s/p IVE OS #2 (08.24.21), #3 (09.22.21), #4 (10.21.21)  - s/p IVE OD #2 (09.22.21), #3 (10.21.21)  - BCVA: LP OD (glaucomatous vision loss); OS 20/70 from 20/100  - OCT shows interval increase in central IRF OD, OS: Interval improvement in subhyaloid heme and IRF; persistent central vitreous traction  - recommend holding IVE OU today, 12.3.21, due to recent stroke (11.05.21)  - pt in agreement -- will follow up in 8 weeks  - [employer] benefits investigation started 11.4.20 -- approved for 2021  - f/u 8 weeks, DFE, OCT, possible injection  3. VMT OS  - stable VMT, contributing to central edema OS  - monitor  4,5. Hypertensive retinopathy OU  - discussed importance of tight BP control and its relation to 1,2 above  - monitor  6. Pseudophakia OU  - s/p CE/IOL OU  - doing well  - monitor  7. History of stroke  - ischemic stroke on 12/26/2018  - small acute infarct in R thalamus (11.08.21)  - pt reports some visual decline following 2020 stroke  8,9. POAG, severe stage OD; Ocular Hypertension OU  - history of steroid response  - IOP good at 10,13 today  - disc OD pale/atrophic and BCVA down to LP  - suspect glaucomatous vision loss since last visit in 2020 -- pt has not been on any pressure lowering drops  - restarted Cosopt and brimonidine TID OD, added latanoprost QHS OD  - s/p SLT OD (10.15.21, Dr. Delsa Sale at [facility])  [place] Ordered this visit:  No orders of the defined types were placed in this encounter.    Return in about 8 weeks (around 01/10/2021) for f/u PDR OU, DFE, OCT.  There are no Patient Instructions on file for this visit.  Explained the diagnoses, plan, and follow up  with the patient and they expressed understanding.  Patient expressed understanding of the importance of proper follow up care.   This document serves as a record of services personally performed by Gardiner Sleeper, MD, PhD. It was created on their behalf by San Jetty. Owens Shark, OA an ophthalmic technician. The creation of this record is the provider's dictation and/or activities during the visit.    Electronically signed by: San Jetty. Owens Shark, [place] 12.01.2021 10:53 PM   Gardiner Sleeper, M.D., Ph.D. Diseases & Surgery of the Retina and Vitreous [facility]  I have reviewed the above documentation for accuracy and completeness, and I agree with the above. Gardiner Sleeper, M.D., Ph.D. 11/15/20 10:53 PM   Abbreviations: M myopia (nearsighted); A astigmatism; H hyperopia (farsighted); P presbyopia; Mrx spectacle prescription;  CTL contact lenses; OD right eye; OS left eye; OU both eyes  XT exotropia; ET esotropia; PEK punctate epithelial keratitis; PEE punctate epithelial erosions; DES dry eye syndrome; MGD meibomian gland dysfunction; ATs artificial tears; PFAT's preservative free artificial tears; [place] nuclear sclerotic cataract; PSC posterior subcapsular  cataract; ERM epi-retinal membrane; PVD posterior vitreous detachment; RD retinal detachment; DM diabetes mellitus; DR diabetic retinopathy; NPDR non-proliferative diabetic retinopathy; PDR proliferative diabetic retinopathy; CSME clinically significant macular edema; DME diabetic macular edema; dbh dot blot hemorrhages; CWS cotton wool spot; POAG primary open angle glaucoma; C/D cup-to-disc ratio; HVF humphrey visual field; GVF goldmann visual field; OCT optical coherence tomography; IOP intraocular pressure; BRVO Branch retinal vein occlusion; CRVO central retinal vein occlusion; CRAO central retinal artery occlusion; BRAO branch retinal artery occlusion; RT retinal tear; SB scleral buckle; PPV pars plana vitrectomy; VH Vitreous  hemorrhage; PRP panretinal laser photocoagulation; IVK intravitreal kenalog; VMT vitreomacular traction; MH Macular hole;  NVD neovascularization of the disc; NVE neovascularization elsewhere; AREDS age related eye disease study; ARMD age related macular degeneration; POAG primary open angle glaucoma; EBMD epithelial/anterior basement membrane dystrophy; ACIOL anterior chamber intraocular lens; IOL intraocular lens; PCIOL posterior chamber intraocular lens; Phaco/IOL phacoemulsification with intraocular lens placement; [place] photorefractive keratectomy; LASIK laser assisted in situ keratomileusis; HTN hypertension; DM diabetes mellitus; COPD chronic obstructive pulmonary disease

## 2020-11-15 ENCOUNTER — Ambulatory Visit (INDEPENDENT_AMBULATORY_CARE_PROVIDER_SITE_OTHER): Payer: Medicare HMO | Admitting: Ophthalmology

## 2020-11-15 ENCOUNTER — Other Ambulatory Visit: Payer: Self-pay

## 2020-11-15 ENCOUNTER — Encounter (INDEPENDENT_AMBULATORY_CARE_PROVIDER_SITE_OTHER): Payer: Self-pay | Admitting: Ophthalmology

## 2020-11-15 DIAGNOSIS — H3581 Retinal edema: Secondary | ICD-10-CM | POA: Diagnosis not present

## 2020-11-15 DIAGNOSIS — H43822 Vitreomacular adhesion, left eye: Secondary | ICD-10-CM

## 2020-11-15 DIAGNOSIS — H4311 Vitreous hemorrhage, right eye: Secondary | ICD-10-CM

## 2020-11-15 DIAGNOSIS — E113513 Type 2 diabetes mellitus with proliferative diabetic retinopathy with macular edema, bilateral: Secondary | ICD-10-CM

## 2020-11-15 DIAGNOSIS — H401113 Primary open-angle glaucoma, right eye, severe stage: Secondary | ICD-10-CM

## 2020-11-15 DIAGNOSIS — H40053 Ocular hypertension, bilateral: Secondary | ICD-10-CM

## 2020-11-15 DIAGNOSIS — Z961 Presence of intraocular lens: Secondary | ICD-10-CM

## 2020-11-15 DIAGNOSIS — H35033 Hypertensive retinopathy, bilateral: Secondary | ICD-10-CM

## 2020-11-15 DIAGNOSIS — I10 Essential (primary) hypertension: Secondary | ICD-10-CM

## 2020-11-15 DIAGNOSIS — Z8673 Personal history of transient ischemic attack (TIA), and cerebral infarction without residual deficits: Secondary | ICD-10-CM

## 2020-11-21 ENCOUNTER — Ambulatory Visit (INDEPENDENT_AMBULATORY_CARE_PROVIDER_SITE_OTHER): Payer: Medicare HMO | Admitting: Adult Health

## 2020-11-21 ENCOUNTER — Encounter: Payer: Self-pay | Admitting: Adult Health

## 2020-11-21 VITALS — BP 118/72 | HR 61

## 2020-11-21 DIAGNOSIS — E11311 Type 2 diabetes mellitus with unspecified diabetic retinopathy with macular edema: Secondary | ICD-10-CM | POA: Diagnosis not present

## 2020-11-21 DIAGNOSIS — I639 Cerebral infarction, unspecified: Secondary | ICD-10-CM

## 2020-11-21 DIAGNOSIS — E785 Hyperlipidemia, unspecified: Secondary | ICD-10-CM | POA: Diagnosis not present

## 2020-11-21 DIAGNOSIS — I10 Essential (primary) hypertension: Secondary | ICD-10-CM | POA: Diagnosis not present

## 2020-11-21 DIAGNOSIS — Z794 Long term (current) use of insulin: Secondary | ICD-10-CM

## 2020-11-21 DIAGNOSIS — I6381 Other cerebral infarction due to occlusion or stenosis of small artery: Secondary | ICD-10-CM

## 2020-11-21 NOTE — Progress Notes (Signed)
I agree with the above plan 

## 2020-11-21 NOTE — Patient Instructions (Signed)
Continue clopidogrel 75 mg daily  and atorvastatin 80mg  daily  for secondary stroke prevention  Stop aspirin at this time as 3 weeks DAPT completed and no indication for prolonged DAPT as after 3 weeks only increases risk of bleed and does not benefit in regards to stroke prevention   Continue to follow up with facility regarding cholesterol, blood pressure and diabetes management  Maintain strict control of hypertension with blood pressure goal below 130/90, diabetes with hemoglobin A1c goal below 7.0% and cholesterol with LDL cholesterol (bad cholesterol) goal below 70 mg/dL.      Followup in the future with me in 4 months or call earlier if needed      Thank you for coming to see [place] at [facility]. I hope we have been able to provide you high quality care today.  You may receive a patient satisfaction survey over the next few weeks. We would appreciate your feedback and comments so that we may continue to improve ourselves and the health of our patients.

## 2020-11-21 NOTE — Progress Notes (Signed)
[facility] [address] [place]. [place] [address] [phone]       [place] Date of Birth:  [birth year] Medical Record Number:  [id number]   Reason for Referral:  hospital stroke follow up    SUBJECTIVE:   CHIEF COMPLAINT:  Chief Complaint  Patient presents with  . Hospitalization Follow-up    Rm 9, alone, pt c/o of pain in right eye, pt in wheelchair     HPI:   Desiree Pollard is a 74 y.o. female with PMHx of HTN, CKD, CHB s/p pacer, chronic anemia, breast cancer in remission, and hx of stroke 12/2018 who presented 10/18/2020 with a 2-day history of dizziness.  Personally reviewed hospitalization pertinent progress notes, lab work and imaging with summary provided.  Evaluated by Dr. Leonie Man with stroke work-up revealing small right thalamic infarct secondary to small vessel disease with multiple prior lacunar infarcts.  In addition to acute infarct, imaging noted chronic right frontal, b/l BG, and cerebellar lacunes and presumable right anterior cerebellar subacute infarct.  TCD b/l elevated MCA velocities and diffuse intracranial atherosclerosis. CUS b/l 1 to 39% carotid stenosis.  2D echo EF 60 to 65% with apical variant hypertrophic cardiomyopathy.  LDL 61.  A1c 6.1.  Recommended DAPT for 3 weeks followed by Plavix alone as on aspirin PTA.  Residual deficits cognitive impairment, left lower facial weakness, mild left hemiparesis with numbness/tingling and gait impairment.  Developed hematuria 11/12 likely in setting of UTI treated with Rocephin and however DAPT.  Once hematuria cleared, Plavix only restarted.  Evaluated by therapies who recommended discharge to SNF for ongoing therapy needs and discharged to [facility] in stable condition on 10/28/2020.  Today, 11/21/2020, Desiree Pollard is being seen for hospital follow-up unaccompanied. She continues to reside at [facility]. Reports residual left sided weakness, cognitive  impairment and gait impairment. Working with therapies -unable to verify if working with PT/OT/SLP.  Reports she is not ambulating but is able to stand/pivot with assistance for transferring to wheelchair.  Baseline cognitive impairment slightly worsened post stroke but does believe improvement.  Denies new or worsening stroke/TIA symptoms.  Complains of right eye pain due to recently having injection by retina specialist. Per review of MAR provided by facility, currently on Plavix and aspirin as well as atorvastatin 80 mg daily.  Assuming aspirin initiated once discharged to facility as she was not discharged on aspirin from [facility] due to episode of hematuria.  Denies additional bleeding or bruising.  Blood pressure today initially elevated but on recheck 118/72.  Blood pressure and glucose levels monitored at facility but patient unable to provide any additional information regarding recent levels.  No concerns at this time.    ROS:   14 system review of systems performed and negative with exception of eye pain  PMH:  Past Medical History:  Diagnosis Date  . Breast cancer ([place])    2017  . CVA (cerebral vascular accident) ([place]) 11/2018  . Dehydration 07/2020  . Diabetes mellitus without complication ([place])   . Diabetic retinopathy ([place])    PDR OU  . Frequent diarrhea 07/2020  . Hyperkalemia 07/2020  . Hypertensive retinopathy    OU  . Stroke [facility])     PSH:  Past Surgical History:  Procedure Laterality Date  . CATARACT EXTRACTION Right   . MASTECTOMY Bilateral    "2017" Left Mastectomy; "2019" Right Mastectomy  . PACEMAKER IMPLANT  2017    Social History:  Social History  Socioeconomic History  . Marital status: Legally Separated    Spouse name: Not on file  . Number of children: 1  . Years of education: Not on file  . Highest education level: Not on file  Occupational History  . Not on file  Tobacco Use  . Smoking status: Former Research scientist (life sciences)  . Smokeless tobacco: Never Used   Vaping Use  . Vaping Use: Never used  Substance and Sexual Activity  . Alcohol use: Not Currently  . Drug use: Never  . Sexual activity: Not Currently  Other Topics Concern  . Not on file  Social History Narrative   Lives with Sister   Social Determinants of Health   Financial Resource Strain: Not on file  Food Insecurity: Not on file  Transportation Needs: Not on file  Physical Activity: Not on file  Stress: Not on file  Social Connections: Not on file  Intimate Partner Violence: Not on file    Family History:  Family History  Problem Relation Age of Onset  . Ovarian cancer Mother   . Heart disease Father   . Diabetes Sister   . Diabetes Brother   . Colon cancer Neg Hx   . Esophageal cancer Neg Hx   . Pancreatic cancer Neg Hx   . Stomach cancer Neg Hx   . Liver disease Neg Hx     Medications:   Current Outpatient Medications on File Prior to Visit  Medication Sig Dispense Refill  . Accu-Chek FastClix Lancets MISC 1 each by Other route as directed. To test blood glucose daily.    Marland Kitchen atorvastatin (LIPITOR) 80 MG tablet Take 80 mg by mouth daily.     . Blood Glucose Monitoring Suppl (FIFTY50 GLUCOSE METER 2.0) w/Device KIT 1 each by Other route See admin instructions. Use to check blood sugars    . carvedilol (COREG) 25 MG tablet Take 25 mg by mouth 2 (two) times daily with a meal.     . clopidogrel (PLAVIX) 75 MG tablet Take 1 tablet (75 mg total) by mouth daily.    . dorzolamide-timolol (COSOPT) 22.3-6.8 MG/ML ophthalmic solution INSTILL 1 DROP INTO BOTH EYES TWICE A DAY (Patient taking differently: Place 1 drop into the right eye 2 (two) times daily.) 10 mL 3  . Ferrous Sulfate (IRON PO) Take 1 tablet by mouth daily. 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    . ferrous sulfate 325 (65 FE) MG tablet Take 325 mg by mouth daily with breakfast.    . furosemide (LASIX) 10 MG/ML solution Take by mouth daily.    . hydrALAZINE (APRESOLINE) 25 MG tablet Take 1 tablet (25 mg total) by mouth every  8 (eight) hours.    . insulin aspart (NOVOLOG) 100 UNIT/ML injection 0-6 Units, Subcutaneous, 3 times daily with meals CBG < 70: Implement Hypoglycemia measures CBG 70 - 120: 0 units CBG 121 - 150: 0 units CBG 151 - 200: 1 unit CBG 201-250: 2 units CBG 251-300: 3 units CBG 301-350: 4 units CBG 351-400: 5 units CBG > 400: Give 6 units and call MD 10 mL 11  . Insulin Pen Needle (FIFTY50 PEN NEEDLES) 32G X 4 MM MISC 1 each by Other route See admin instructions. Use as instructed.    . loperamide (IMODIUM A-D) 2 MG tablet Take 1 tablet (2 mg total) by mouth 4 (four) times daily as needed for diarrhea or loose stools. 30 tablet 2  . pantoprazole (PROTONIX) 40 MG tablet Take 1 tablet (40 mg total) by mouth daily.    Marland Kitchen  sertraline (ZOLOFT) 25 MG tablet Take 25 mg by mouth daily.     No current facility-administered medications on file prior to visit.    Allergies:   Allergies  Allergen Reactions  . Amlodipine Nausea And Vomiting    Pt refuses to take due to severe n/v that began after starting amlodipine and stopped after discontinuation.      OBJECTIVE:  Physical Exam  Vitals:   11/21/20 0841  BP: 118/72  Pulse: 61   There is no height or weight on file to calculate BMI. No exam data present  General: well developed, well nourished,  pleasant elderly African-American female, seated, in no evident distress Cardiovascular: regular rate and rhythm, no murmurs Musculoskeletal: no deformity Skin:  no rash/petichiae Vascular:  Normal pulses all extremities   Neurologic Exam Mental Status: Awake and fully alert.   Mild dysarthria.  Able to follow commands without difficulty.  Oriented to place and time.  Recent and remote memory impaired. Attention span concentration appropriate and fund of knowledge impaired. Mood and affect appropriate.  Cranial Nerves: Fundoscopic exam d/t pain and difficulty viewing. Extraocular movements full without nystagmus. Visual fields full in L eye with  chronic visual impairment R eye. Hearing intact. Facial sensation intact.  Mild left lower facial weakness.  Tongue and palate moves normally and symmetrically.  Motor: Normal bulk and tone. Normal strength in all tested extremity muscles except mild LUE weakness distally and left hip flexor weakness.  Sensory.: intact to touch , pinprick , position and vibratory sensation.  Coordination: Rapid alternating movements normal in all extremities except decreased left hand. Finger-to-nose and heel-to-shin performed accurately bilaterally. Gait and Station: Deferred as patient nonambulatory (pre pt report) Reflexes: 1+ and symmetric. Toes downgoing.     NIHSS  2 post stroke Modified Rankin  3-4      ASSESSMENT: Desiree Pollard is a 74 y.o. year old female presented with 2 day hx of dizziness on 10/18/2020 with stroke work-up revealing small right thalamic infarct secondary to small vessel disease in setting of multiple prior lacunar infarcts. Vascular risk factors include HTN, DMII, HLD, acute on chronic anemia, thrombocytopenia, CHB s/p pacer and apical variant hypertrophic cardiomyopathy.      PLAN:  1. Right thalamic stroke:  a. Residual deficit: Mild left hemiparesis, gait impairment, dysarthria and cognitive impairment.  Currently residing at [facility] working with therapies.   b. Continue clopidogrel 75 mg daily  and atorvastatin 80 mg daily for secondary stroke prevention.  Advised to discontinue aspirin as greater than 3 weeks DAPT completed and no indication for prolonged DAPT c. Discussed secondary stroke prevention measures and importance of close PCP/facility follow up for aggressive stroke risk factor management  2. HTN: BP goal <130/90.  Stable today on carvedilol and hydralazine per facility 3. HLD: LDL goal <70. Recent LDL 61 on atorvastatin 80 mg daily per PCP/facility 4. DMII: A1c goal<7.0. Recent A1c 6.1. On NovoLog per facility.  Diabetic retinopathy routinely followed by  retina specialist    Follow up in 4 months or call earlier if needed   CC:  [facility] provider: Dr. Leonie Man   I spent 45 minutes of face-to-face and non-face-to-face time with patient.  This included previsit chart review including recent hospitalization pertinent progress notes, lab work and imaging, lab review, study review, order entry, electronic health record documentation, patient education regarding recent stroke including etiology, residual deficits, importance of managing stroke risk factors and answered all other questions to patient satisfaction   Frann Rider, AGNP-BC  [facility] [address] [place], [place] [address]  Phone [phone] Fax [phone] Note: This document was prepared with digital dictation and possible smart phrase technology. Any transcriptional errors that result from this process are unintentional.

## 2020-11-23 ENCOUNTER — Observation Stay (HOSPITAL_COMMUNITY)
Admission: EM | Admit: 2020-11-23 | Discharge: 2020-11-26 | Disposition: A | Discharge status: Home / Self Care | Payer: Medicare HMO | Attending: Family Medicine | Admitting: Family Medicine

## 2020-11-23 ENCOUNTER — Other Ambulatory Visit: Payer: Self-pay

## 2020-11-23 ENCOUNTER — Encounter (HOSPITAL_COMMUNITY): Payer: Self-pay | Admitting: Emergency Medicine

## 2020-11-23 DIAGNOSIS — Z8673 Personal history of transient ischemic attack (TIA), and cerebral infarction without residual deficits: Secondary | ICD-10-CM

## 2020-11-23 DIAGNOSIS — Z79899 Other long term (current) drug therapy: Secondary | ICD-10-CM | POA: Diagnosis not present

## 2020-11-23 DIAGNOSIS — I129 Hypertensive chronic kidney disease with stage 1 through stage 4 chronic kidney disease, or unspecified chronic kidney disease: Secondary | ICD-10-CM | POA: Insufficient documentation

## 2020-11-23 DIAGNOSIS — E1122 Type 2 diabetes mellitus with diabetic chronic kidney disease: Secondary | ICD-10-CM | POA: Insufficient documentation

## 2020-11-23 DIAGNOSIS — Z20822 Contact with and (suspected) exposure to covid-19: Secondary | ICD-10-CM | POA: Diagnosis not present

## 2020-11-23 DIAGNOSIS — Z853 Personal history of malignant neoplasm of breast: Secondary | ICD-10-CM | POA: Insufficient documentation

## 2020-11-23 DIAGNOSIS — D696 Thrombocytopenia, unspecified: Secondary | ICD-10-CM | POA: Diagnosis present

## 2020-11-23 DIAGNOSIS — N189 Chronic kidney disease, unspecified: Secondary | ICD-10-CM

## 2020-11-23 DIAGNOSIS — N3001 Acute cystitis with hematuria: Secondary | ICD-10-CM | POA: Diagnosis not present

## 2020-11-23 DIAGNOSIS — R531 Weakness: Secondary | ICD-10-CM | POA: Diagnosis present

## 2020-11-23 DIAGNOSIS — N184 Chronic kidney disease, stage 4 (severe): Secondary | ICD-10-CM | POA: Diagnosis not present

## 2020-11-23 DIAGNOSIS — Z794 Long term (current) use of insulin: Secondary | ICD-10-CM | POA: Diagnosis not present

## 2020-11-23 DIAGNOSIS — Z87891 Personal history of nicotine dependence: Secondary | ICD-10-CM | POA: Diagnosis not present

## 2020-11-23 DIAGNOSIS — N390 Urinary tract infection, site not specified: Secondary | ICD-10-CM

## 2020-11-23 DIAGNOSIS — I10 Essential (primary) hypertension: Secondary | ICD-10-CM | POA: Diagnosis present

## 2020-11-23 DIAGNOSIS — T68XXXA Hypothermia, initial encounter: Secondary | ICD-10-CM

## 2020-11-23 DIAGNOSIS — M6281 Muscle weakness (generalized): Secondary | ICD-10-CM | POA: Insufficient documentation

## 2020-11-23 DIAGNOSIS — E119 Type 2 diabetes mellitus without complications: Secondary | ICD-10-CM

## 2020-11-23 DIAGNOSIS — Z950 Presence of cardiac pacemaker: Secondary | ICD-10-CM | POA: Diagnosis not present

## 2020-11-23 DIAGNOSIS — D631 Anemia in chronic kidney disease: Secondary | ICD-10-CM

## 2020-11-23 LAB — CBC WITH DIFFERENTIAL/PLATELET
Abs Immature Granulocytes: 0.02 10*3/uL (ref 0.00–0.07)
Basophils Absolute: 0 10*3/uL (ref 0.0–0.1)
Basophils Relative: 1 %
Eosinophils Absolute: 0.2 10*3/uL (ref 0.0–0.5)
Eosinophils Relative: 4 %
HCT: 27.7 % — ABNORMAL LOW (ref 36.0–46.0)
Hemoglobin: 8.7 g/dL — ABNORMAL LOW (ref 12.0–15.0)
Immature Granulocytes: 0 %
Lymphocytes Relative: 19 %
Lymphs Abs: 0.9 10*3/uL (ref 0.7–4.0)
MCH: 29.1 pg (ref 26.0–34.0)
MCHC: 31.4 g/dL (ref 30.0–36.0)
MCV: 92.6 fL (ref 80.0–100.0)
Monocytes Absolute: 0.4 10*3/uL (ref 0.1–1.0)
Monocytes Relative: 7 %
Neutro Abs: 3.4 10*3/uL (ref 1.7–7.7)
Neutrophils Relative %: 69 %
Platelets: 146 10*3/uL — ABNORMAL LOW (ref 150–400)
RBC: 2.99 MIL/uL — ABNORMAL LOW (ref 3.87–5.11)
RDW: 13.5 % (ref 11.5–15.5)
WBC: 4.9 10*3/uL (ref 4.0–10.5)
nRBC: 0 % (ref 0.0–0.2)

## 2020-11-23 LAB — COMPREHENSIVE METABOLIC PANEL
ALT: 24 U/L (ref 0–44)
AST: 22 U/L (ref 15–41)
Albumin: 2.5 g/dL — ABNORMAL LOW (ref 3.5–5.0)
Alkaline Phosphatase: 96 U/L (ref 38–126)
Anion gap: 9 (ref 5–15)
BUN: 18 mg/dL (ref 8–23)
CO2: 21 mmol/L — ABNORMAL LOW (ref 22–32)
Calcium: 8.9 mg/dL (ref 8.9–10.3)
Chloride: 116 mmol/L — ABNORMAL HIGH (ref 98–111)
Creatinine, Ser: 2.79 mg/dL — ABNORMAL HIGH (ref 0.44–1.00)
GFR, Estimated: 17 mL/min — ABNORMAL LOW (ref >60)
Glucose, Bld: 121 mg/dL — ABNORMAL HIGH (ref 70–99)
Potassium: 3.4 mmol/L — ABNORMAL LOW (ref 3.5–5.1)
Sodium: 146 mmol/L — ABNORMAL HIGH (ref 135–145)
Total Bilirubin: 0.4 mg/dL (ref 0.3–1.2)
Total Protein: 5.8 g/dL — ABNORMAL LOW (ref 6.5–8.1)

## 2020-11-23 LAB — CBG MONITORING, ED: Glucose-Capillary: 112 mg/dL — ABNORMAL HIGH (ref 70–99)

## 2020-11-23 NOTE — ED Triage Notes (Addendum)
Pt to ED via [facility] who reports pt's family came to visit her today and thought she looked weaker than normal.  St's pt did not eat today.  Pt is at her baseline per nursing staff at [facility]  When asked, pt just st's doesn't feel good

## 2020-11-24 ENCOUNTER — Emergency Department (HOSPITAL_COMMUNITY): Payer: Medicare HMO

## 2020-11-24 DIAGNOSIS — T68XXXA Hypothermia, initial encounter: Secondary | ICD-10-CM

## 2020-11-24 DIAGNOSIS — N390 Urinary tract infection, site not specified: Secondary | ICD-10-CM

## 2020-11-24 DIAGNOSIS — G9341 Metabolic encephalopathy: Secondary | ICD-10-CM | POA: Insufficient documentation

## 2020-11-24 DIAGNOSIS — R531 Weakness: Secondary | ICD-10-CM

## 2020-11-24 LAB — CBC
HCT: 30.3 % — ABNORMAL LOW (ref 36.0–46.0)
Hemoglobin: 9.1 g/dL — ABNORMAL LOW (ref 12.0–15.0)
MCH: 27.9 pg (ref 26.0–34.0)
MCHC: 30 g/dL (ref 30.0–36.0)
MCV: 92.9 fL (ref 80.0–100.0)
Platelets: 139 10*3/uL — ABNORMAL LOW (ref 150–400)
RBC: 3.26 MIL/uL — ABNORMAL LOW (ref 3.87–5.11)
RDW: 13.5 % (ref 11.5–15.5)
WBC: 6.1 10*3/uL (ref 4.0–10.5)
nRBC: 0 % (ref 0.0–0.2)

## 2020-11-24 LAB — PROTIME-INR
INR: 1.2 (ref 0.8–1.2)
Prothrombin Time: 14.5 seconds (ref 11.4–15.2)

## 2020-11-24 LAB — URINALYSIS, ROUTINE W REFLEX MICROSCOPIC
Bilirubin Urine: NEGATIVE
Glucose, UA: NEGATIVE mg/dL
Hgb urine dipstick: NEGATIVE
Ketones, ur: NEGATIVE mg/dL
Nitrite: NEGATIVE
Protein, ur: >=300 mg/dL — AB
Specific Gravity, Urine: 1.012 (ref 1.005–1.030)
WBC, UA: >50 WBC/hpf — ABNORMAL HIGH (ref 0–5)
pH: 5 (ref 5.0–8.0)

## 2020-11-24 LAB — RESP PANEL BY RT-PCR (FLU A&B, COVID) ARPGX2
Influenza A by PCR: NEGATIVE
Influenza B by PCR: NEGATIVE
SARS Coronavirus 2 by RT PCR: NEGATIVE

## 2020-11-24 LAB — TROPONIN I (HIGH SENSITIVITY)
Troponin I (High Sensitivity): 26 ng/L — ABNORMAL HIGH (ref <18)
Troponin I (High Sensitivity): 24 ng/L — ABNORMAL HIGH (ref <18)

## 2020-11-24 LAB — TSH: TSH: 1.946 u[IU]/mL (ref 0.350–4.500)

## 2020-11-24 LAB — LACTIC ACID, PLASMA: Lactic Acid, Venous: 0.6 mmol/L (ref 0.5–1.9)

## 2020-11-24 LAB — GLUCOSE, CAPILLARY
Glucose-Capillary: 72 mg/dL (ref 70–99)
Glucose-Capillary: 139 mg/dL — ABNORMAL HIGH (ref 70–99)

## 2020-11-24 LAB — CREATININE, SERUM
Creatinine, Ser: 2.61 mg/dL — ABNORMAL HIGH (ref 0.44–1.00)
GFR, Estimated: 18 mL/min — ABNORMAL LOW (ref >60)

## 2020-11-24 LAB — APTT: aPTT: 33 seconds (ref 24–36)

## 2020-11-24 LAB — LIPASE, BLOOD: Lipase: 22 U/L (ref 11–51)

## 2020-11-24 IMAGING — CT CT HEAD W/O CM
4 series · 16 of 47 positions shown, 18 images · non-contrast
Comparison: [DATE], [DATE]

CLINICAL DATA: Weakness and dizziness

EXAM:
CT HEAD WITHOUT CONTRAST
TECHNIQUE: Contiguous axial images were obtained from the base of the skull
through the vertex without intravenous contrast.

[Series 3: head wo · axial · 0.34mm/px · z∈[-193,-62]mm · 8 of 37 slices shown, 10 images]
[im 5/37  brain]
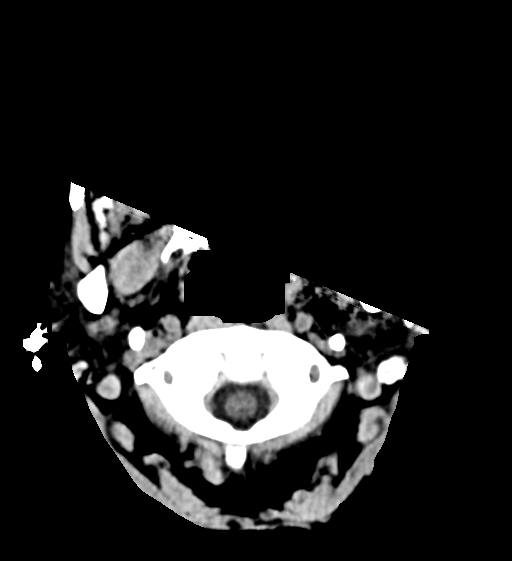
[im 5/37  bone]
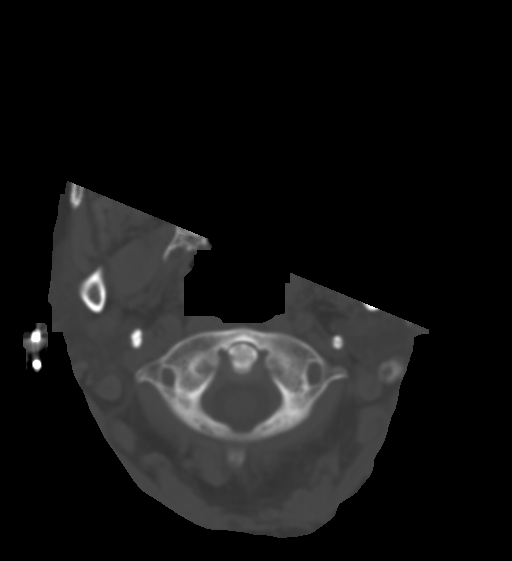
[im 9/37  brain]
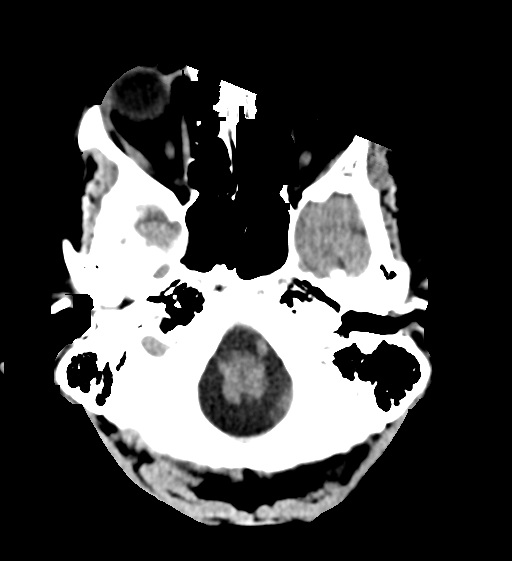
[im 13/37  brain]
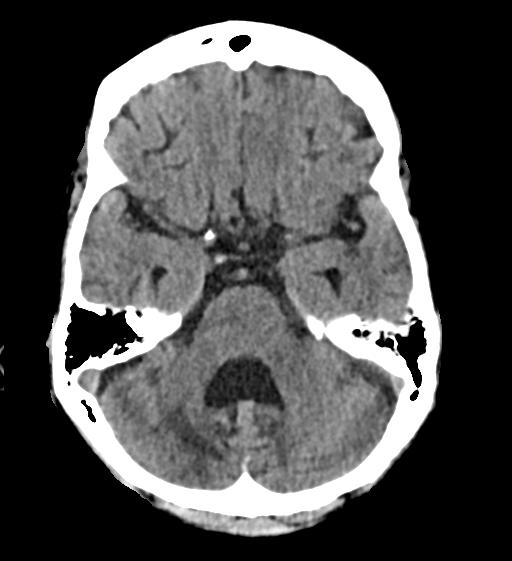
[im 17/37  brain]
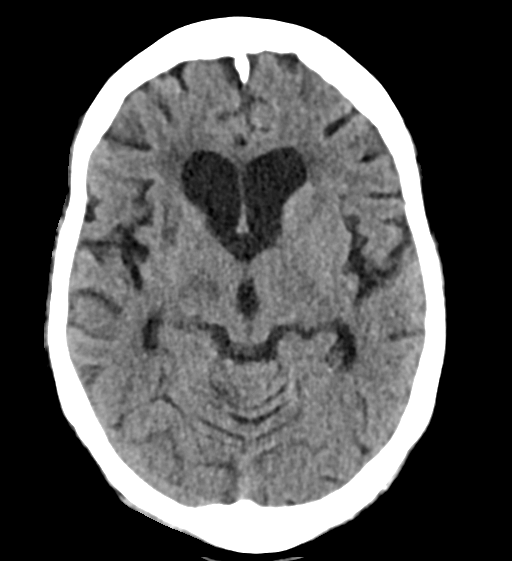
[im 21/37  brain]
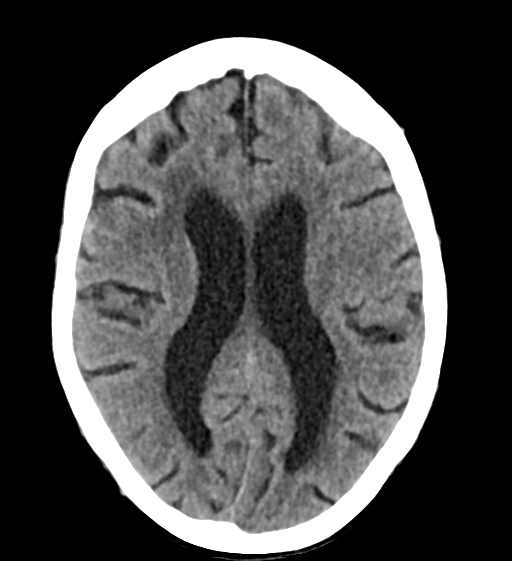
[im 21/37  bone]
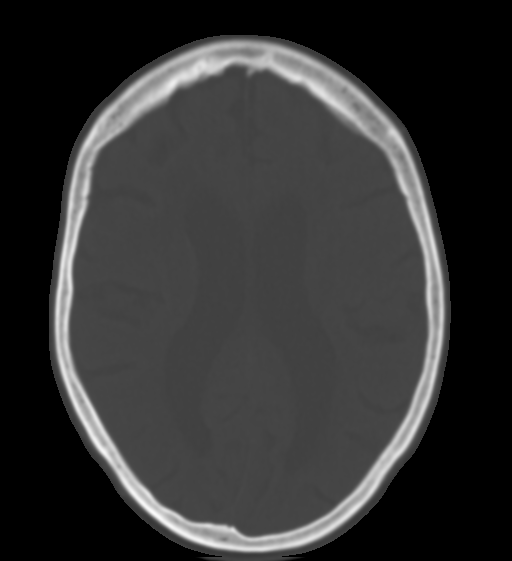
[im 25/37  brain]
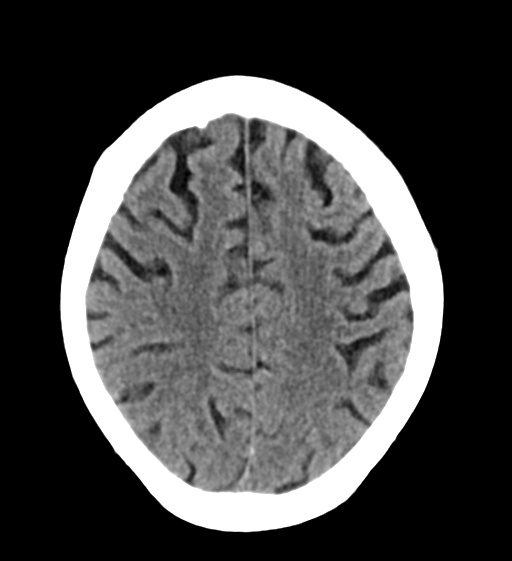
[im 29/37  brain]
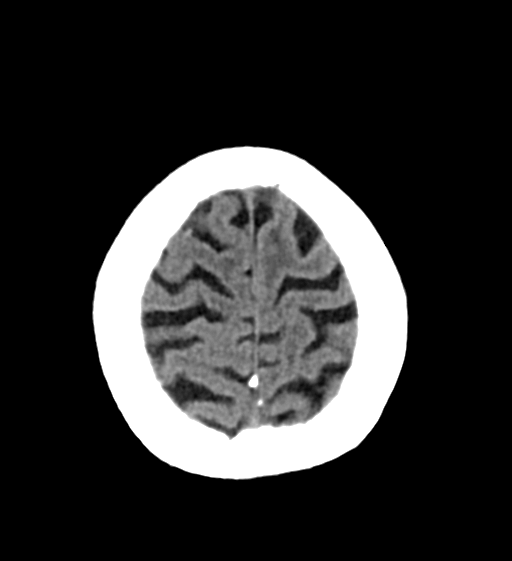
[im 33/37  brain]
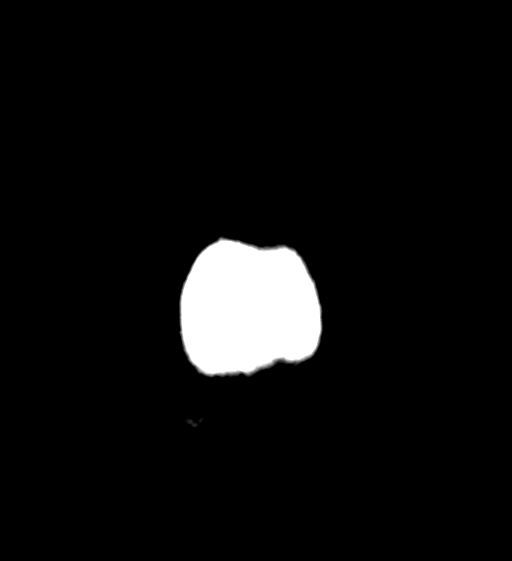

[Series 4: head bone · axial · 0.44mm/px · z∈[-159,-143]mm · 2 of 76 slices shown]
[im 8/76  bone]
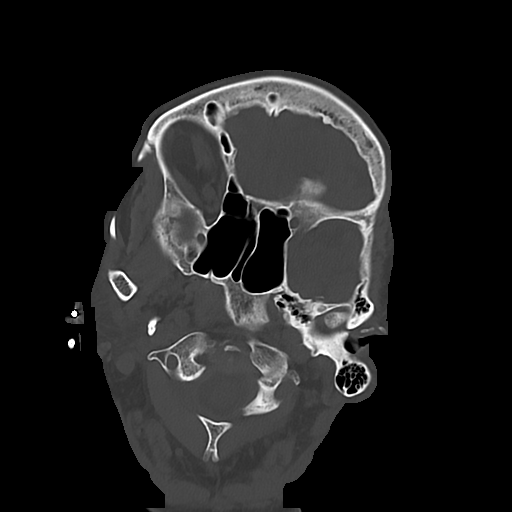
[im 16/76  bone]
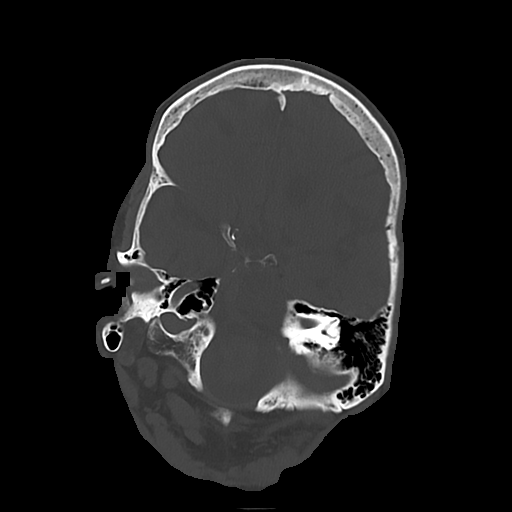

[Series 5: cor soft · coronal · 0.31mm/px · 3 of 65 slices shown]
[im 22/65  brain]
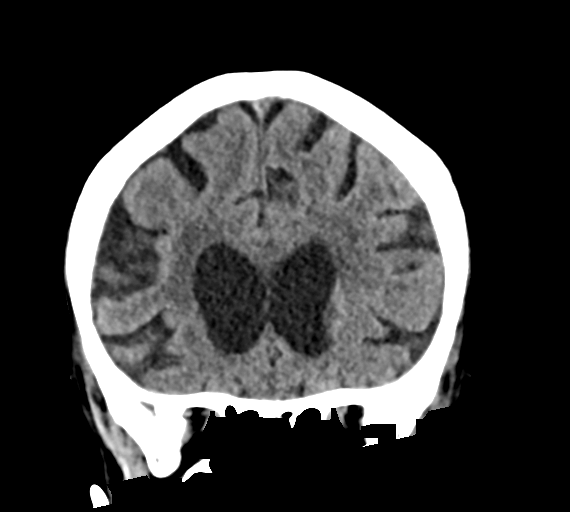
[im 29/65  brain]
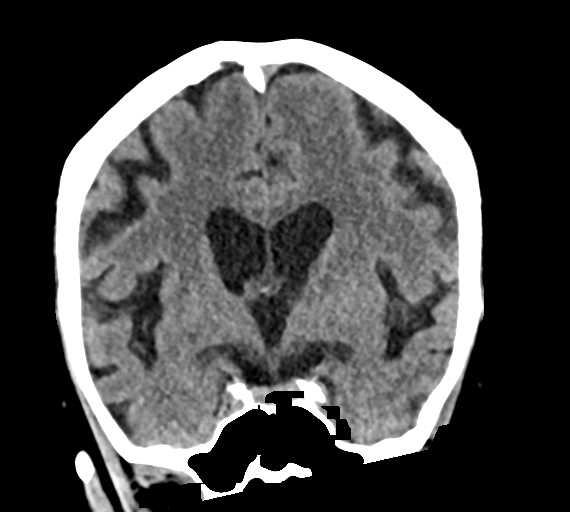
[im 36/65  brain]
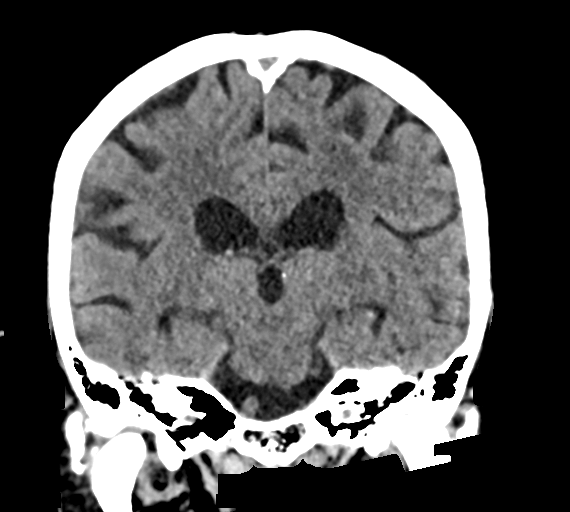

[Series 6: sag soft · sagittal · 0.35mm/px · 3 of 56 slices shown]
[im 25/56  brain]
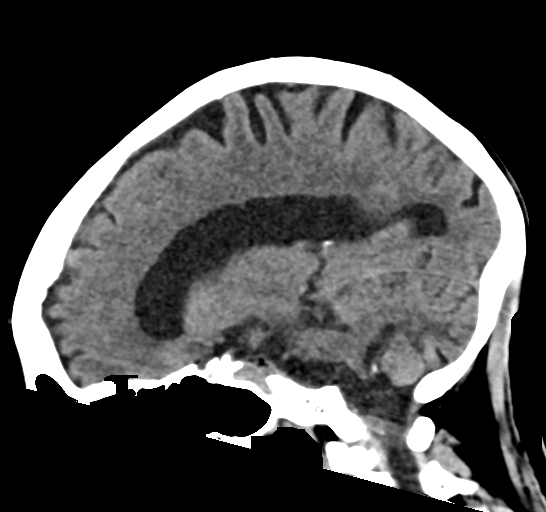
[im 30/56  brain]
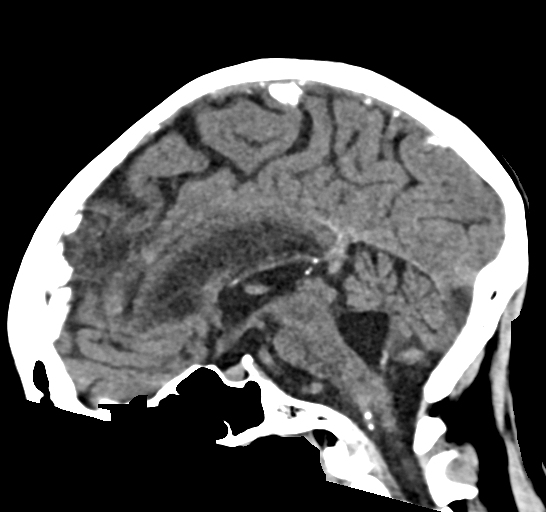
[im 34/56  brain]
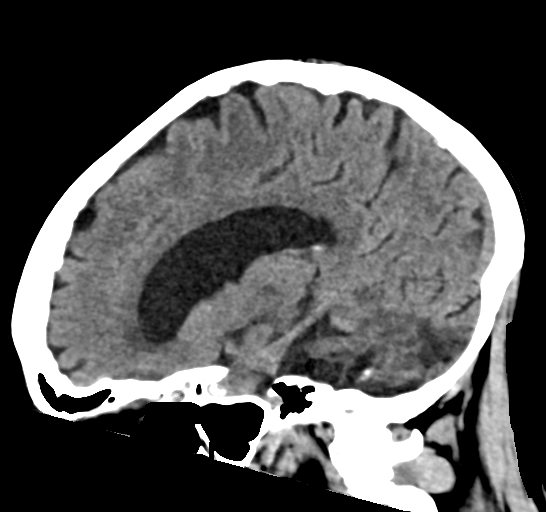

[16 of 47 positions shown; findings below may reference images not displayed]

FINDINGS: Brain: Stable atrophy pattern, scattered white matter microvascular
ischemic changes and areas of remote infarct in the right basal
ganglia and both cerebellar hemispheres. No acute intracranial
hemorrhage, new mass lesion, definite new infarction, midline shift,
herniation, mass effect, or extra-axial fluid collection. Cisterns
are patent. Stable associated ventricular enlargement.

Vascular: Intracranial atherosclerosis at the skull base. No
hyperdense vessel.

Skull: Normal. Negative for fracture or focal lesion.

Sinuses/Orbits: No acute finding.

Other: None.
IMPRESSION: Stable atrophy, chronic white matter microvascular ischemic changes
and remote areas of infarct as above.

No definite acute intracranial abnormality by noncontrast CT.

## 2020-11-24 IMAGING — DX DG CHEST 1V PORT
1 series · 1 of 1 positions shown · non-contrast
Comparison: [DATE].

CLINICAL DATA: Weakness.

EXAM:
PORTABLE CHEST 1 VIEW

[chest]
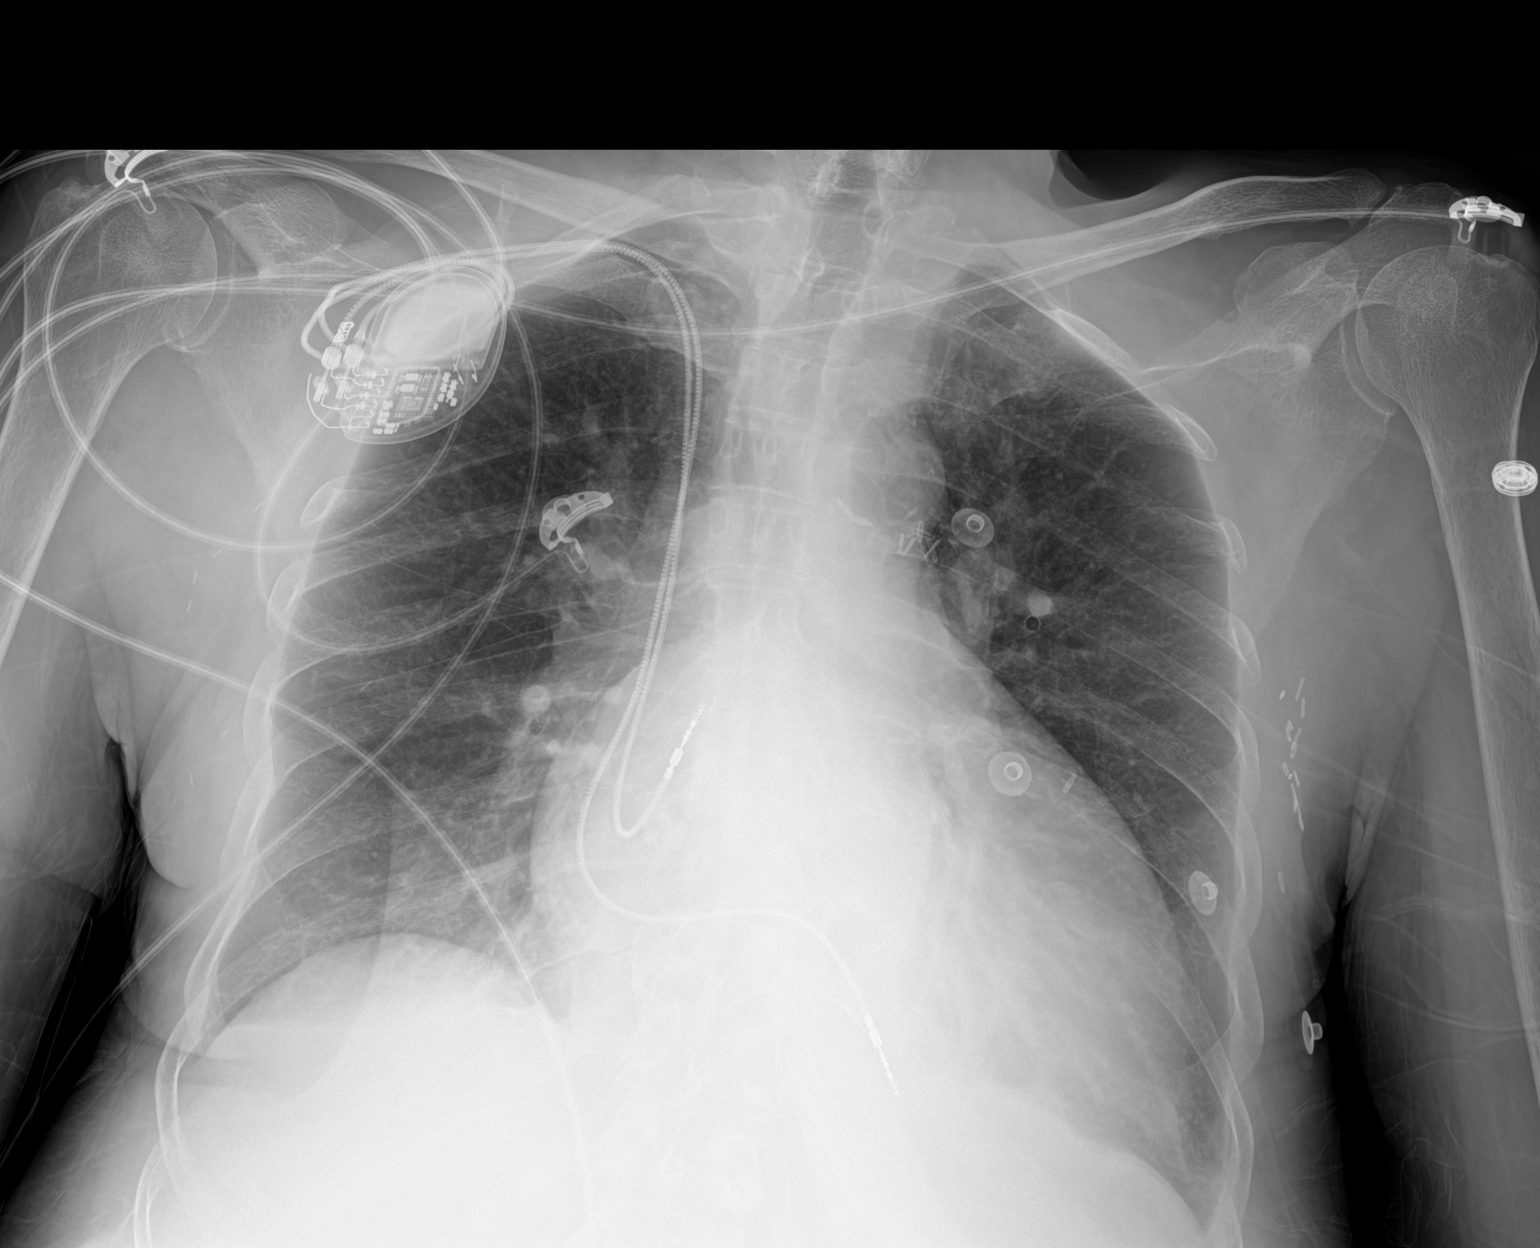

[1 of 1 positions shown; findings below may reference images not displayed]

FINDINGS: Stable cardiomegaly. Right-sided pacemaker is unchanged in position.
No pneumothorax or pleural effusion is noted. Both lungs are clear.
The visualized skeletal structures are unremarkable.
IMPRESSION: No active disease.

## 2020-11-24 MED ORDER — CARVEDILOL 12.5 MG PO TABS
25.0000 mg | ORAL_TABLET | Freq: Two times a day (BID) | ORAL | Status: DC
  Administered 2020-11-24 – 2020-11-26 (×6): 25 mg via ORAL
  Filled 2020-11-24 (×4): qty 2
  Filled 2020-11-24: qty 8
  Filled 2020-11-24: qty 2

## 2020-11-24 MED ORDER — INSULIN ASPART 100 UNIT/ML ~~LOC~~ SOLN: 0.0000 [IU] | Freq: Every day | SUBCUTANEOUS | Status: DC

## 2020-11-24 MED ORDER — HYDRALAZINE HCL 25 MG PO TABS
25.0000 mg | ORAL_TABLET | Freq: Three times a day (TID) | ORAL | Status: DC
  Administered 2020-11-24 – 2020-11-26 (×9): 25 mg via ORAL
  Filled 2020-11-24 (×10): qty 1

## 2020-11-24 MED ORDER — ACETAMINOPHEN 650 MG RE SUPP: 650.0000 mg | Freq: Four times a day (QID) | RECTAL | Status: DC | PRN

## 2020-11-24 MED ORDER — SODIUM CHLORIDE 0.9 % IV SOLN
1.0000 g | Freq: Every day | INTRAVENOUS | Status: DC
  Administered 2020-11-24 – 2020-11-25 (×2): 1 g via INTRAVENOUS
  Filled 2020-11-24 (×2): qty 10

## 2020-11-24 MED ORDER — ATORVASTATIN CALCIUM 80 MG PO TABS
80.0000 mg | ORAL_TABLET | Freq: Every day | ORAL | Status: DC
  Administered 2020-11-24 – 2020-11-26 (×3): 80 mg via ORAL
  Filled 2020-11-24 (×3): qty 1

## 2020-11-24 MED ORDER — POTASSIUM CHLORIDE CRYS ER 20 MEQ PO TBCR
40.0000 meq | EXTENDED_RELEASE_TABLET | Freq: Once | ORAL | Status: AC
  Administered 2020-11-24: 17:00:00 40 meq via ORAL
  Filled 2020-11-24: qty 2

## 2020-11-24 MED ORDER — LACTATED RINGERS IV SOLN: INTRAVENOUS | Status: DC

## 2020-11-24 MED ORDER — ACETAMINOPHEN 325 MG PO TABS: 650.0000 mg | ORAL_TABLET | Freq: Four times a day (QID) | ORAL | Status: DC | PRN

## 2020-11-24 MED ORDER — HEPARIN SODIUM (PORCINE) 5000 UNIT/ML IJ SOLN
5000.0000 [IU] | Freq: Three times a day (TID) | INTRAMUSCULAR | Status: DC
  Administered 2020-11-24 – 2020-11-26 (×8): 5000 [IU] via SUBCUTANEOUS
  Filled 2020-11-24 (×8): qty 1

## 2020-11-24 MED ORDER — INSULIN ASPART 100 UNIT/ML ~~LOC~~ SOLN: 0.0000 [IU] | Freq: Three times a day (TID) | SUBCUTANEOUS | Status: DC

## 2020-11-24 MED ORDER — LACTATED RINGERS IV SOLN: INTRAVENOUS | Status: AC

## 2020-11-24 MED ORDER — SODIUM CHLORIDE 0.9 % IV BOLUS
1000.0000 mL | Freq: Once | INTRAVENOUS | Status: AC
  Administered 2020-11-24: 11:00:00 1000 mL via INTRAVENOUS

## 2020-11-24 MED ORDER — CLOPIDOGREL BISULFATE 75 MG PO TABS
75.0000 mg | ORAL_TABLET | Freq: Every day | ORAL | Status: DC
  Administered 2020-11-24 – 2020-11-26 (×3): 75 mg via ORAL
  Filled 2020-11-24 (×3): qty 1

## 2020-11-24 MED ORDER — DORZOLAMIDE HCL-TIMOLOL MAL 2-0.5 % OP SOLN
1.0000 [drp] | Freq: Two times a day (BID) | OPHTHALMIC | Status: DC
  Administered 2020-11-24 – 2020-11-26 (×5): 1 [drp] via OPHTHALMIC
  Filled 2020-11-24: qty 10

## 2020-11-24 NOTE — ED Notes (Signed)
Talked with sister,Mrs. Cheek,at [phone].  Gave update.  Requested we call with room number when available

## 2020-11-24 NOTE — Progress Notes (Signed)
Pt has been admitted to the unit via bed. Pt has all equipment with them. All belongings has been sent. All IV is intact and tele is being connected. Pt denies any pain and has received her bath. Call light and telephone is within reach.  11/24/20 1744  Vitals  Temp 97.6 F (36.4 C)  Temp Source Oral  BP (!) 143/80  MAP (mmHg) 95  BP Location Right Arm  BP Method Automatic  Patient Position (if appropriate) Lying  Pulse Rate 67  Pulse Rate Source Dinamap  Resp 16  Level of Consciousness  Level of Consciousness Alert  MEWS COLOR  MEWS Score Color Green  Oxygen Therapy  SpO2 100 %  O2 Device Room Air  Patient Activity (if Appropriate) In bed  Pulse Oximetry Type Intermittent  Pain Assessment  Pain Scale 0-10  Pain Score 0  MEWS Score  MEWS Temp 0  MEWS Systolic 0  MEWS Pulse 0  MEWS RR 0  MEWS LOC 0  MEWS Score 0

## 2020-11-24 NOTE — ED Provider Notes (Signed)
[facility] EMERGENCY DEPARTMENT Provider Note   CSN: [id number] Arrival date & time: 11/23/20  1958     History Chief Complaint  Patient presents with  . Generalized Weakness    Desiree Pollard is a 73 y.o. female with a pmh of CKD stage IV, complete heart block status post permanent pacemaker, history of breast cancer in remission, insulin-dependent type 2 diabetes, hypertension, chronic anemia, status post CVA with left sided weakness.  She was most recently admitted 1 month ago with new onset dizziness and dysarthria ,found to have acute CVA in the thalamus.  Patient maximized on therapies and is currently in skilled nursing facility for rehab.  She is not vaccinated and needed against Covid.  History is difficult to obtain because the patient has significant dysarthria and is not loquacious with answers. The patient is here for evaluation of weakness. She states that it is generalized. She states that it started yesterday. She denies feelings of presyncope or orthostatics, she denies fevers, abdominal pain, nausea, vomiting, chest pain, shortness of breath, urinary symptoms, constipation, diarrhea, headache, cough. She denies any now weakness, numbness, changes in vision. Additional history is gathered from the patient's sister by telephone, who states that yesterday her sister had an acute change.  She did not get out of bed all day and had called her and told her she felt very badly.  She did not eat anything.  Normally she is able to get up and walk with her walker but did not do so yesterday.  Her sister states that when she went to see her she seemed to notice some change in her facial symmetry which appeared new to her.  She states that she seemed either sick or to potentially have a new stroke and wanted her seen.  HPI     Past Medical History:  Diagnosis Date  . Breast cancer ([place])    2017  . CVA (cerebral vascular accident) ([place]) 11/2018  . Dehydration  07/2020  . Diabetes mellitus without complication ([place])   . Diabetic retinopathy ([place])    PDR OU  . Frequent diarrhea 07/2020  . Hyperkalemia 07/2020  . Hypertensive retinopathy    OU  . Stroke [facility])     Patient Active Problem List   Diagnosis Date Noted  . Acute metabolic encephalopathy 61/44/3154  . Weakness 11/24/2020  . Acute lower UTI 11/24/2020  . Hypothermia 11/24/2020  . ARF (acute renal failure) ([place]) 10/19/2020  . Dizziness 10/19/2020  . Thrombocytopenia ([place]) 10/19/2020  . Chronic diarrhea 09/18/2020  . Loss of weight 09/18/2020  . Dysphagia 09/18/2020  . Insulin dependent type 2 diabetes mellitus ([place]) 06/16/2020  . Hemoglobin A1c less than 7.0% 06/16/2020  . Hyperglycemia 06/16/2020  . Hypertension 06/16/2020  . History of stroke 06/16/2020    Past Surgical History:  Procedure Laterality Date  . CATARACT EXTRACTION Right   . MASTECTOMY Bilateral    "2017" Left Mastectomy; "2019" Right Mastectomy  . PACEMAKER IMPLANT  2017     OB History   No obstetric history on file.     Family History  Problem Relation Age of Onset  . Ovarian cancer Mother   . Heart disease Father   . Diabetes Sister   . Diabetes Brother   . Colon cancer Neg Hx   . Esophageal cancer Neg Hx   . Pancreatic cancer Neg Hx   . Stomach cancer Neg Hx   . Liver disease Neg Hx     Social History  Tobacco Use  . Smoking status: Former Research scientist (life sciences)  . Smokeless tobacco: Never Used  Vaping Use  . Vaping Use: Never used  Substance Use Topics  . Alcohol use: Not Currently  . Drug use: Never    Home Medications Prior to Admission medications   Medication Sig Start Date End Date Taking? Authorizing Provider  Accu-Chek FastClix Lancets MISC 1 each by Other route as directed. To test blood glucose daily. 05/24/19   [provider]  atorvastatin (LIPITOR) 80 MG tablet Take 80 mg by mouth daily.  10/13/18   [provider]  Blood Glucose Monitoring Suppl (FIFTY50 GLUCOSE  METER 2.0) w/Device KIT 1 each by Other route See admin instructions. Use to check blood sugars 08/31/18   [provider]  carvedilol (COREG) 25 MG tablet Take 25 mg by mouth 2 (two) times daily with a meal.  10/13/18   [provider]  clopidogrel (PLAVIX) 75 MG tablet Take 1 tablet (75 mg total) by mouth daily. 10/24/20   Ghimire, Henreitta Leber, MD  dorzolamide-timolol (COSOPT) 22.3-6.8 MG/ML ophthalmic solution INSTILL 1 DROP INTO BOTH EYES TWICE A DAY Patient taking differently: Place 1 drop into the right eye 2 (two) times daily. 01/14/20   Bernarda Caffey, MD  Ferrous Sulfate (IRON PO) Take 1 tablet by mouth daily. 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    [provider]  ferrous sulfate 325 (65 FE) MG tablet Take 325 mg by mouth daily with breakfast.    [provider]  furosemide (LASIX) 10 MG/ML solution Take by mouth daily.    [provider]  hydrALAZINE (APRESOLINE) 25 MG tablet Take 1 tablet (25 mg total) by mouth every 8 (eight) hours. 10/28/20   Ghimire, Henreitta Leber, MD  insulin aspart (NOVOLOG) 100 UNIT/ML injection 0-6 Units, Subcutaneous, 3 times daily with meals CBG < 70: Implement Hypoglycemia measures CBG 70 - 120: 0 units CBG 121 - 150: 0 units CBG 151 - 200: 1 unit CBG 201-250: 2 units CBG 251-300: 3 units CBG 301-350: 4 units CBG 351-400: 5 units CBG > 400: Give 6 units and call MD 10/24/20   Jonetta Osgood, MD  Insulin Pen Needle (FIFTY50 PEN NEEDLES) 32G X 4 MM MISC 1 each by Other route See admin instructions. Use as instructed. 11/17/16   [provider]  loperamide (IMODIUM A-D) 2 MG tablet Take 1 tablet (2 mg total) by mouth 4 (four) times daily as needed for diarrhea or loose stools. 08/09/20   [name], FNP  pantoprazole (PROTONIX) 40 MG tablet Take 1 tablet (40 mg total) by mouth daily. 10/24/20   Ghimire, Henreitta Leber, MD  sertraline (ZOLOFT) 25 MG tablet Take 25 mg by mouth daily.    [provider]    Allergies     Amlodipine  Review of Systems   Review of Systems Ten systems reviewed and are negative for acute change, except as noted in the HPI.   Physical Exam Updated Vital Signs BP (!) 192/63   Pulse 62   Temp 97.6 F (36.4 C) (Oral)   Resp (!) 8   Ht 5' 7"  (1.702 m)   Wt 68.1 kg   SpO2 99%   BMI 23.51 kg/m   Physical Exam Vitals and nursing note reviewed.  Constitutional:      General: She is not in acute distress.    Appearance: She is well-developed and well-nourished. She is not diaphoretic.  HENT:     Head: Normocephalic and atraumatic.  Eyes:  General: No scleral icterus.    Conjunctiva/sclera: Conjunctivae normal.  Cardiovascular:     Rate and Rhythm: Normal rate and regular rhythm.     Heart sounds: Normal heart sounds. No murmur heard. No friction rub. No gallop.   Pulmonary:     Effort: Pulmonary effort is normal. No respiratory distress.     Breath sounds: Normal breath sounds.  Abdominal:     General: Bowel sounds are normal. There is no distension.     Palpations: Abdomen is soft. There is no mass.     Tenderness: There is no abdominal tenderness. There is no guarding.  Musculoskeletal:        General: Normal range of motion.     Cervical back: Normal range of motion.     Right lower leg: No edema.     Left lower leg: No edema.  Skin:    General: Skin is warm and dry.  Neurological:     Mental Status: She is alert and oriented to person, place, and time.     Motor: Weakness (Right sided) present.     Comments: Dysarthric speech  Psychiatric:        Behavior: Behavior normal.     ED Results / Procedures / Treatments   Labs (all labs ordered are listed, but only abnormal results are displayed) Labs Reviewed  CBC WITH DIFFERENTIAL/PLATELET - Abnormal; Notable for the following components:      Result Value   RBC 2.99 (*)    Hemoglobin 8.7 (*)    HCT 27.7 (*)    Platelets 146 (*)    All other components within normal limits  COMPREHENSIVE  METABOLIC PANEL - Abnormal; Notable for the following components:   Sodium 146 (*)    Potassium 3.4 (*)    Chloride 116 (*)    CO2 21 (*)    Glucose, Bld 121 (*)    Creatinine, Ser 2.79 (*)    Total Protein 5.8 (*)    Albumin 2.5 (*)    GFR, Estimated 17 (*)    All other components within normal limits  URINALYSIS, ROUTINE W REFLEX MICROSCOPIC - Abnormal; Notable for the following components:   APPearance HAZY (*)    Protein, ur >=300 (*)    Leukocytes,Ua SMALL (*)    WBC, UA >50 (*)    Bacteria, UA MANY (*)    All other components within normal limits  CBG MONITORING, ED - Abnormal; Notable for the following components:   Glucose-Capillary 112 (*)    All other components within normal limits  TROPONIN I (HIGH SENSITIVITY) - Abnormal; Notable for the following components:   Troponin I (High Sensitivity) 26 (*)    All other components within normal limits  TROPONIN I (HIGH SENSITIVITY) - Abnormal; Notable for the following components:   Troponin I (High Sensitivity) 24 (*)    All other components within normal limits  RESP PANEL BY RT-PCR (FLU A&B, COVID) ARPGX2  CULTURE, BLOOD (ROUTINE X 2)  CULTURE, BLOOD (ROUTINE X 2)  URINE CULTURE  LIPASE, BLOOD  LACTIC ACID, PLASMA  PROTIME-INR  APTT  TSH  CBC  CREATININE, SERUM    EKG EKG Interpretation  Date/Time:  Sunday November 24 2020 09:28:20 EST Ventricular Rate:  69 PR Interval:    QRS Duration: 174 QT Interval:  500 QTC Calculation: 520 R Axis:   -64 Text Interpretation: A-V dual-paced complexes w/ some inhibition No further analysis attempted due to paced rhythm Baseline wander in lead(s) V2 pacing new  since prior tracing Confirmed by Dorie Rank [phone]) on 11/24/2020 9:40:04 AM Also confirmed by Dorie Rank [phone]), editor Hattie Perch [phone])  on 11/24/2020 3:04:56 PM   Radiology CT Head Wo Contrast  Result Date: 11/24/2020 CLINICAL DATA:  Weakness and dizziness EXAM: CT HEAD WITHOUT CONTRAST TECHNIQUE:  Contiguous axial images were obtained from the base of the skull through the vertex without intravenous contrast. COMPARISON:  10/21/2020, 10/18/2020 FINDINGS: Brain: Stable atrophy pattern, scattered white matter microvascular ischemic changes and areas of remote infarct in the right basal ganglia and both cerebellar hemispheres. No acute intracranial hemorrhage, new mass lesion, definite new infarction, midline shift, herniation, mass effect, or extra-axial fluid collection. Cisterns are patent. Stable associated ventricular enlargement. Vascular: Intracranial atherosclerosis at the skull base. No hyperdense vessel. Skull: Normal. Negative for fracture or focal lesion. Sinuses/Orbits: No acute finding. Other: None. IMPRESSION: Stable atrophy, chronic white matter microvascular ischemic changes and remote areas of infarct as above. No definite acute intracranial abnormality by noncontrast CT. Electronically Signed   By: Jerilynn Mages.  Shick M.D.   On: 11/24/2020 10:03   DG Chest Port 1 View  Result Date: 11/24/2020 CLINICAL DATA:  Weakness. EXAM: PORTABLE CHEST 1 VIEW COMPARISON:  October 18, 2020. FINDINGS: Stable cardiomegaly. 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 No pneumothorax or pleural effusion is noted. Both lungs are clear. The visualized skeletal structures are unremarkable. IMPRESSION: No active disease. Electronically Signed   By: Marijo Conception M.D.   On: 11/24/2020 09:16    Procedures Procedures (including critical care time)  Medications Ordered in ED Medications  carvedilol (COREG) tablet 25 mg (25 mg Oral Given 11/24/20 0950)  hydrALAZINE (APRESOLINE) tablet 25 mg (25 mg Oral Given 11/24/20 1634)  lactated ringers infusion (has no administration in time range)  cefTRIAXone (ROCEPHIN) 1 g in sodium chloride 0.9 % 100 mL IVPB (1 g Intravenous New Bag/Given 11/24/20 1633)  atorvastatin (LIPITOR) tablet 80 mg (has no administration in time range)  clopidogrel (PLAVIX) tablet 75 mg  (has no administration in time range)  dorzolamide-timolol (COSOPT) 22.3-6.8 MG/ML ophthalmic solution 1 drop (has no administration in time range)  insulin aspart (novoLOG) injection 0-9 Units (has no administration in time range)  insulin aspart (novoLOG) injection 0-5 Units (has no administration in time range)  heparin injection 5,000 Units (has no administration in time range)  acetaminophen (TYLENOL) tablet 650 mg (has no administration in time range)    Or  acetaminophen (TYLENOL) suppository 650 mg (has no administration in time range)  sodium chloride 0.9 % bolus 1,000 mL (0 mLs Intravenous Stopped 11/24/20 1156)  potassium chloride SA (KLOR-CON) CR tablet 40 mEq (40 mEq Oral Given 11/24/20 1634)    ED Course  I have reviewed the triage vital signs and the nursing notes.  Pertinent labs & imaging results that were available during my care of the patient were reviewed by me and considered in my medical decision making (see chart for details).  Clinical Course as of 11/24/20 1642  Sun Nov 24, 2020  4818 Creatinine(!): 2.79 [AH]  0843 Hemoglobin(!): 8.7 [AH]  0931 TSH: 1.946 [AH]  0951 Temp(!): 94.6 F (34.8 C) Patient has a rectal temperature of 94.6. She does not have other criteria at this time meeting SIRS.  I have ordered labs, lactic acid and blood culture as well as TSH.  She does not have an environmental reason to have hypothermia. [AH]    Clinical Course User Index [AH] Margarita Mail, PA-C   MDM Rules/Calculators/A&P  AE:SLPNPYYF and fatigue VS: BP (!) 192/63   Pulse 62   Temp 97.6 F (36.4 C) (Oral)   Resp (!) 8   Ht 5' 7"  (1.702 m)   Wt 68.1 kg   SpO2 99%   BMI 23.51 kg/m   RT:MYTRZNB is gathered by patient and sister. Previous records obtained and reviewed. DDX:The patient's complaint of weakness involves an extensive number of diagnostic and treatment options, and is a complaint that carries with it a high risk of  complications, morbidity, and potential mortality. Given the large differential diagnosis, medical decision making is of high complexity. The differential diagnosis of weakness includes but is not limited to neurologic causes (GBS, myasthenia gravis, CVA, MS, ALS, transverse myelitis, spinal cord injury, CVA, botulism, ) and other causes: ACS, Arrhythmia, syncope, orthostatic hypotension, sepsis, hypoglycemia, electrolyte disturbance, hypothyroidism, respiratory failure, symptomatic anemia, dehydration, heat injury, polypharmacy, malignancy.  Labs: I ordered reviewed and interpreted labs which include  cbc with stable normocytic anemia cmp with baseline creatinine and mildly elevated sodium likely due to some dehydration Lipase wnl  resp panel negative for flu or covid Lipase wnl Troponin slightly elevated in the setting of CKD Urine with infection    Imaging: I ordered and reviewed images which included cxr/ ct head. I independently visualized and interpreted all imaging. There are no acute, significant findings on today's images. EKG: paced rhythm at a rate of 69 Consults:Dr Chaterjee of [facility] VAP:OLIDCVU with evidence of UTI . Will treat with rocephin. Admitted to [facility]. Patient disposition:The patient appears reasonably stabilized for admission considering the current resources, flow, and capabilities available in the ED at this time, and I doubt any other [facility] requiring further screening and/or treatment in the ED prior to admission.        Final Clinical Impression(s) / ED Diagnoses Final diagnoses:  Acute cystitis with hematuria    Rx / DC Orders ED Discharge Orders    None       Margarita Mail, PA-C 11/24/20 1643    Dorie Rank, MD 11/25/20 1558

## 2020-11-24 NOTE — ED Notes (Signed)
EDP notified of rectal temp. bair hugger placed on pt

## 2020-11-24 NOTE — H&P (Signed)
History and Physical:    Desiree Pollard   BVQ:[id number] DOB: 1946/07/12 DOA: 11/23/2020  Referring MD/provider: PA Margarita Mail PCP: Patient, No Pcp Per   Patient coming from: SNF  Chief Complaint: Lethargy, fatigue and hypothermia.  Story is per ED documentation and the patient. History of Present Illness:   Desiree Pollard is an 74 y.o. 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  Patient apparently usually participates in physical therapy at the SNF.  However apparently for the past 2 days patient has declined to get up to do physical therapy.  Patient reportedly called her sister telling her that she felt unwell.  Patient herself tells me that she is just felt very weak and very tired.  She admits that she is sleeping more than usual.  Mitts to decreased p.o. intake.  Note she has not had anything to eat since yesterday.  Denies feeling confused and knows that she is in the hospital due to fatigue.  Patient denies any weakness on one particular side of her body or the other.  Denies any difficulty with comprehension or with speech.  Notes that she is just extremely tired.  ED Course:  The patient was noted to be hypothermic with initial temperature of 94.6.  He was also markedly hypertensive with initial blood pressure of 205/74.   Given hypothermia, patient was worked up for infection and while chest x-ray was negative UA was positive for UTI.  She also underwent a head CT which was negative.  And hypothermia and weakness, patient is now admitted for treatment of UTI and hypothermia.  ROS:   ROS   Review of Systems: Respiratory: Denies cough, SOB at rest or hemoptysis Cardiovascular: Denies chest pain or palpitations GI: Denies nausea,  vomiting, diarrhea or constipation   Past Medical History:   Past Medical History:  Diagnosis Date  . Breast cancer ([place])    2017  . CVA (cerebral vascular accident) ([place]) 11/2018  . Dehydration 07/2020  . Diabetes mellitus without complication ([place])   . Diabetic retinopathy ([place])    PDR OU  . Frequent diarrhea 07/2020  . Hyperkalemia 07/2020  . Hypertensive retinopathy    OU  . Stroke [facility])     Past Surgical History:   Past Surgical History:  Procedure Laterality Date  . CATARACT EXTRACTION Right   . MASTECTOMY Bilateral    "2017" Left Mastectomy; "2019" Right Mastectomy  . PACEMAKER IMPLANT  2017    Social History:   Social History   Socioeconomic History  . Marital status: Legally Separated    Spouse name: Not on file  . Number of children: 1  . Years of education: Not on file  . Highest education level: Not on file  Occupational History  . Not on file  Tobacco Use  . Smoking status: Former Research scientist (life sciences)  . Smokeless tobacco: Never Used  Vaping Use  . Vaping Use: Never used  Substance and Sexual Activity  . Alcohol use: Not Currently  . Drug use: Never  . Sexual activity: Not Currently  Other Topics Concern  . Not on file  Social History Narrative   Lives with Sister   Social Determinants of Health   Financial Resource Strain: Not on file  Food Insecurity:  Not on file  Transportation Needs: Not on file  Physical Activity: Not on file  Stress: Not on file  Social Connections: Not on file  Intimate Partner Violence: Not on file    Allergies   Amlodipine  Family history:   Family History  Problem Relation Age of Onset  . Ovarian cancer Mother   . Heart disease Father   . Diabetes Sister   . Diabetes Brother   . Colon cancer Neg Hx   . Esophageal cancer Neg Hx   . Pancreatic cancer Neg Hx   . Stomach cancer Neg Hx   . Liver disease Neg Hx     Current Medications:   Prior to Admission medications   Medication Sig Start Date End Date  Taking? Authorizing Provider  Accu-Chek FastClix Lancets MISC 1 each by Other route as directed. To test blood glucose daily. 05/24/19   [provider]  atorvastatin (LIPITOR) 80 MG tablet Take 80 mg by mouth daily.  10/13/18   [provider]  Blood Glucose Monitoring Suppl (FIFTY50 GLUCOSE METER 2.0) w/Device KIT 1 each by Other route See admin instructions. Use to check blood sugars 08/31/18   [provider]  carvedilol (COREG) 25 MG tablet Take 25 mg by mouth 2 (two) times daily with a meal.  10/13/18   [provider]  clopidogrel (PLAVIX) 75 MG tablet Take 1 tablet (75 mg total) by mouth daily. 10/24/20   Ghimire, Henreitta Leber, MD  dorzolamide-timolol (COSOPT) 22.3-6.8 MG/ML ophthalmic solution INSTILL 1 DROP INTO BOTH EYES TWICE A DAY Patient taking differently: Place 1 drop into the right eye 2 (two) times daily. 01/14/20   Bernarda Caffey, MD  Ferrous Sulfate (IRON PO) Take 1 tablet by mouth daily. 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    [provider]  ferrous sulfate 325 (65 FE) MG tablet Take 325 mg by mouth daily with breakfast.    [provider]  furosemide (LASIX) 10 MG/ML solution Take by mouth daily.    [provider]  hydrALAZINE (APRESOLINE) 25 MG tablet Take 1 tablet (25 mg total) by mouth every 8 (eight) hours. 10/28/20   Ghimire, Henreitta Leber, MD  insulin aspart (NOVOLOG) 100 UNIT/ML injection 0-6 Units, Subcutaneous, 3 times daily with meals CBG < 70: Implement Hypoglycemia measures CBG 70 - 120: 0 units CBG 121 - 150: 0 units CBG 151 - 200: 1 unit CBG 201-250: 2 units CBG 251-300: 3 units CBG 301-350: 4 units CBG 351-400: 5 units CBG > 400: Give 6 units and call MD 10/24/20   Jonetta Osgood, MD  Insulin Pen Needle (FIFTY50 PEN NEEDLES) 32G X 4 MM MISC 1 each by Other route See admin instructions. Use as instructed. 11/17/16   [provider]  loperamide (IMODIUM A-D) 2 MG tablet Take 1 tablet (2 mg total) by mouth 4  (four) times daily as needed for diarrhea or loose stools. 08/09/20   [name], FNP  pantoprazole (PROTONIX) 40 MG tablet Take 1 tablet (40 mg total) by mouth daily. 10/24/20   Ghimire, Henreitta Leber, MD  sertraline (ZOLOFT) 25 MG tablet Take 25 mg by mouth daily.    [provider]    Physical Exam:   Vitals:   11/24/20 0918 11/24/20 1000 11/24/20 1100 11/24/20 1221  BP:  (!) 205/74 (!) 152/62 (!) 159/66  Pulse:  67 64 60  Resp:  17 11 13   Temp: (!) 94.6 F (34.8 C)  (!) 97.3 F (36.3 C)   TempSrc:  Rectal  Oral   SpO2:  100% 100% 99%  Weight:      Height:         Physical Exam: Blood pressure (!) 159/66, pulse 60, temperature (!) 97.3 F (36.3 C), temperature source Oral, resp. rate 13, height 5' 7"  (1.702 m), weight 68.1 kg, SpO2 99 %. Gen: Thin weak appearing somewhat sleepy female lying in bed under [facility]. Eyes: Right eye is markedly red with droopy eyelids, patient states this is chronic.  Patient also has some injection of her left conjunctiva. CVS: S1-S2, regular, no gallops Respiratory:  decreased air entry likely secondary to decreased inspiratory effort GI: NABS, soft, NT  LE: Trace edema Neuro: Sleepy but arousable by voice alone, patient is coherent when giving history but answers questions in monosyllables.  Comprehension is intact.  Speech is intact.  Moving all extremities equally with normal strength,  Psych: Occult to assess, patient is sleepy.   Skin: no rashes or lesions or ulcers,    Data Review:    Labs: Basic Metabolic Panel: Recent Labs  Lab 11/23/20 2205  NA 146*  K 3.4*  CL 116*  CO2 21*  GLUCOSE 121*  BUN 18  CREATININE 2.79*  CALCIUM 8.9   Liver Function Tests: Recent Labs  Lab 11/23/20 2205  AST 22  ALT 24  ALKPHOS 96  BILITOT 0.4  PROT 5.8*  ALBUMIN 2.5*   Recent Labs  Lab 11/24/20 0913  LIPASE 22   No results for input(s): AMMONIA in the last 168 hours. CBC: Recent Labs  Lab 11/23/20 2205  WBC  4.9  NEUTROABS 3.4  HGB 8.7*  HCT 27.7*  MCV 92.6  PLT 146*   Cardiac Enzymes: No results for input(s): CKTOTAL, CKMB, CKMBINDEX, TROPONINI in the last 168 hours.  BNP (last 3 results) No results for input(s): PROBNP in the last 8760 hours. CBG: Recent Labs  Lab 11/23/20 2149  GLUCAP 112*    Urinalysis    Component Value Date/Time   COLORURINE YELLOW 11/24/2020 1137   APPEARANCEUR HAZY (A) 11/24/2020 1137   LABSPEC 1.012 11/24/2020 1137   PHURINE 5.0 11/24/2020 1137   GLUCOSEU NEGATIVE 11/24/2020 1137   HGBUR NEGATIVE 11/24/2020 1137   BILIRUBINUR NEGATIVE 11/24/2020 1137   KETONESUR NEGATIVE 11/24/2020 1137   PROTEINUR >=300 (A) 11/24/2020 1137   NITRITE NEGATIVE 11/24/2020 1137   LEUKOCYTESUR SMALL (A) 11/24/2020 1137      Radiographic Studies: CT Head Wo Contrast  Result Date: 11/24/2020 CLINICAL DATA:  Weakness and dizziness EXAM: CT HEAD WITHOUT CONTRAST TECHNIQUE: Contiguous axial images were obtained from the base of the skull through the vertex without intravenous contrast. COMPARISON:  10/21/2020, 10/18/2020 FINDINGS: Brain: Stable atrophy pattern, scattered white matter microvascular ischemic changes and areas of remote infarct in the right basal ganglia and both cerebellar hemispheres. No acute intracranial hemorrhage, new mass lesion, definite new infarction, midline shift, herniation, mass effect, or extra-axial fluid collection. Cisterns are patent. Stable associated ventricular enlargement. Vascular: Intracranial atherosclerosis at the skull base. No hyperdense vessel. Skull: Normal. Negative for fracture or focal lesion. Sinuses/Orbits: No acute finding. Other: None. IMPRESSION: Stable atrophy, chronic white matter microvascular ischemic changes and remote areas of infarct as above. No definite acute intracranial abnormality by noncontrast CT. Electronically Signed   By: Jerilynn Mages.  Shick M.D.   On: 11/24/2020 10:03   DG Chest Port 1 View  Result Date:  11/24/2020 CLINICAL DATA:  Weakness. EXAM: PORTABLE CHEST 1 VIEW COMPARISON:  October 18, 2020. FINDINGS: Stable cardiomegaly.  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 No pneumothorax or pleural effusion is noted. Both lungs are clear. The visualized skeletal structures are unremarkable. IMPRESSION: No active disease. Electronically Signed   By: Marijo Conception M.D.   On: 11/24/2020 09:16    EKG: Independently reviewed.  Paced rhythm at 70   Assessment/Plan:   Principal Problem:   Acute metabolic encephalopathy Active Problems:   Insulin dependent type 2 diabetes mellitus (HCC)   Hypertension   History of stroke  Weakness and hypothermia Likely secondary to UTI Temperatures normalized with  bair hugger, will need to follow closely Continue gentle hydration TSH within normal limits at 1.9  UTI Treat with ceftriaxone and gentle fluid resuscitation.   Urine and blood cultures are pending  Hypokalemia Will replete and recheck  CKD Creatinine at baseline at 2.8 with GFR 17.  HTN BP improved Medicines have yet to be reconciled, however carvedilol and hydralazine have been continued. Patient was also on nifedipine upon discharge last month, this can be continued once it's been reconciled  DM2 SSI AC at bedtime  H/O CVA Continue aspirin, Plavix and atorvastatin  Dementia Restart antidementia meds once medicines have been reconciled  Complete Heart Block with PPM Noted  Glaucoma Continue Cosopt  Other information:   DVT prophylaxis: Subcu heparin ordered. Code Status: Full Family Communication: Spoke with patient's sister Disposition Plan: SNF Consults called: None Admission status: Observation  Nichole Neyer Tublu Seneca Gadbois [facility]  If 7PM-7AM, please contact night-coverage [email or web address] Password [facility] 11/24/2020, 12:44 PM

## 2020-11-24 NOTE — ED Notes (Signed)
Sister, mrs Cheek notified of room number

## 2020-11-24 NOTE — ED Notes (Signed)
Patient transported to CT 

## 2020-11-25 DIAGNOSIS — N390 Urinary tract infection, site not specified: Secondary | ICD-10-CM | POA: Diagnosis not present

## 2020-11-25 DIAGNOSIS — D696 Thrombocytopenia, unspecified: Secondary | ICD-10-CM

## 2020-11-25 DIAGNOSIS — N184 Chronic kidney disease, stage 4 (severe): Secondary | ICD-10-CM

## 2020-11-25 DIAGNOSIS — E119 Type 2 diabetes mellitus without complications: Secondary | ICD-10-CM

## 2020-11-25 DIAGNOSIS — Z794 Long term (current) use of insulin: Secondary | ICD-10-CM

## 2020-11-25 LAB — CBC
HCT: 30 % — ABNORMAL LOW (ref 36.0–46.0)
Hemoglobin: 9 g/dL — ABNORMAL LOW (ref 12.0–15.0)
MCH: 28 pg (ref 26.0–34.0)
MCHC: 30 g/dL (ref 30.0–36.0)
MCV: 93.2 fL (ref 80.0–100.0)
Platelets: 136 10*3/uL — ABNORMAL LOW (ref 150–400)
RBC: 3.22 MIL/uL — ABNORMAL LOW (ref 3.87–5.11)
RDW: 13.5 % (ref 11.5–15.5)
WBC: 6.4 10*3/uL (ref 4.0–10.5)
nRBC: 0 % (ref 0.0–0.2)

## 2020-11-25 LAB — BLOOD CULTURE ID PANEL (REFLEXED) - BCID2

## 2020-11-25 LAB — BASIC METABOLIC PANEL
Anion gap: 8 (ref 5–15)
BUN: 17 mg/dL (ref 8–23)
CO2: 20 mmol/L — ABNORMAL LOW (ref 22–32)
Calcium: 9 mg/dL (ref 8.9–10.3)
Chloride: 117 mmol/L — ABNORMAL HIGH (ref 98–111)
Creatinine, Ser: 2.58 mg/dL — ABNORMAL HIGH (ref 0.44–1.00)
GFR, Estimated: 19 mL/min — ABNORMAL LOW (ref >60)
Glucose, Bld: 102 mg/dL — ABNORMAL HIGH (ref 70–99)
Potassium: 3.9 mmol/L (ref 3.5–5.1)
Sodium: 145 mmol/L (ref 135–145)

## 2020-11-25 LAB — GLUCOSE, CAPILLARY
Glucose-Capillary: 87 mg/dL (ref 70–99)
Glucose-Capillary: 75 mg/dL (ref 70–99)
Glucose-Capillary: 114 mg/dL — ABNORMAL HIGH (ref 70–99)

## 2020-11-25 MED ORDER — PANTOPRAZOLE SODIUM 40 MG PO TBEC
40.0000 mg | DELAYED_RELEASE_TABLET | Freq: Every day | ORAL | Status: DC
  Administered 2020-11-25 – 2020-11-26 (×2): 40 mg via ORAL
  Filled 2020-11-25 (×2): qty 1

## 2020-11-25 MED ORDER — AMOXICILLIN 500 MG PO CAPS
500.0000 mg | ORAL_CAPSULE | Freq: Two times a day (BID) | ORAL | Status: DC
  Administered 2020-11-25 – 2020-11-26 (×3): 500 mg via ORAL
  Filled 2020-11-25 (×3): qty 1

## 2020-11-25 MED ORDER — CHLORHEXIDINE GLUCONATE CLOTH 2 % EX PADS
6.0000 | MEDICATED_PAD | Freq: Every day | CUTANEOUS | Status: DC
  Administered 2020-11-25 – 2020-11-26 (×2): 6 via TOPICAL

## 2020-11-25 NOTE — Hospital Course (Signed)
74 year old woman recent stroke in November, diabetes mellitus type 2 presented from [facility] home with increased fatigue, found to be hypothermic which prompted infectious work-up notable for UTI.    A & P  UTI, possibly associated with hypothermia and generalized weakness --Euthermic, treated initially with Bair hugger.  Lactic acid was within normal limits. --Continue treatment for UTI, follow-up culture data  Bacteremia, likely contaminant --Monitor.  Follow-up culture data.  Generalized weakness --PT, OT evaluation  Trivial troponin elevation --EKG showed paced rhythm.  Unclear why these labs were checked as there were no signs or symptoms to suggest ACS.  No further evaluation suggested.  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  Residual deficits cognitive impairment, left lower facial weakness, mild left hemiparesis with numbness, tingling and gait impairment. --Seen by neurology 12/9, at that time plan was to continue Plavix, atorvastatin. No ASA.  Essential hypertension BP goal less than 130/90 --Continue hydralazine and carvedilol  Hyperlipidemia --Continue statin  Anemia of CKD --Stable no further evaluation suggested.  Thrombocytopenia --Mild, intermittent.  Follow intermittently.  Diabetes mellitus type 2 with diabetic retinopathy followed by retina specialist --CBG stable  CKD stage IV --stable  Glaucoma --Continue drops  Complete heart block status post pacemaker Breast cancer in remission

## 2020-11-25 NOTE — Plan of Care (Signed)
  Problem: Health Behavior/Discharge Planning: Goal: Ability to manage health-related needs will improve Outcome: Not Progressing   Problem: Activity: Goal: Risk for activity intolerance will decrease Outcome: Not Progressing   Problem: Pain Managment: Goal: General experience of comfort will improve Outcome: Not Progressing   

## 2020-11-25 NOTE — Plan of Care (Signed)
Mod assist with adls 

## 2020-11-25 NOTE — Plan of Care (Signed)

## 2020-11-25 NOTE — Progress Notes (Signed)
PROGRESS NOTE  Desiree Pollard DUK:[id number] DOB: [birth year] DOA: 11/23/2020 PCP: Patient, No Pcp Per  Brief History   74 year old woman recent stroke in November, diabetes mellitus type 2 presented from [place] home with increased fatigue, found to be hypothermic which prompted infectious work-up notable for UTI.    A & P  UTI, possibly associated with hypothermia and generalized weakness --Euthermic, treated initially with Bair hugger.  Lactic acid was within normal limits. --Continue treatment for UTI, follow-up culture data  Bacteremia, likely contaminant --Monitor.  Follow-up culture data.  Generalized weakness --PT, OT evaluation  Trivial troponin elevation --EKG showed paced rhythm.  Unclear why these labs were checked as there were no signs or symptoms to suggest ACS.  No further evaluation suggested.  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  Residual deficits cognitive impairment, left lower facial weakness, mild left hemiparesis with numbness, tingling and gait impairment. --Seen by neurology 12/9, at that time plan was to continue Plavix, atorvastatin. No ASA.  Essential hypertension BP goal less than 130/90 --Continue hydralazine and carvedilol  Hyperlipidemia --Continue statin  Anemia of CKD --Stable no further evaluation suggested.  Thrombocytopenia --Mild, intermittent.  Follow intermittently.  Diabetes mellitus type 2 with diabetic retinopathy followed by retina specialist --CBG stable  CKD stage IV --stable  Glaucoma --Continue drops  Complete heart block status post pacemaker Breast cancer in remission  Disposition Plan:  Discussion: appears improved, continue current plan  Dispo: The patient is from: SNF              Anticipated d/c is to: SNF              Anticipated d/c date is: 1 day              Patient currently is not medically stable to d/c.  DVT prophylaxis: heparin  injection 5,000 Units Start: 11/24/20 1430   Code Status: Full Code Family Communication: none  Murray Hodgkins, MD  [facility] Direct contact: see [email or web address] (further directions at bottom of note if needed) 7PM-7AM contact night coverage as at bottom of note 11/25/2020, 6:36 PM  LOS: 0 days   Significant Hospital Events   .    Consults:  .    Procedures:  .   Significant Diagnostic Tests:  Marland Kitchen    Micro Data:  .    Antimicrobials:  .   Interval History/Subjective  CC: f/u weakness  Feels better, no complaints  Objective   Vitals:  Vitals:   11/25/20 1146 11/25/20 1708  BP: (!) 180/72 (!) 153/112  Pulse: 60 (!) 59  Resp: 18 16  Temp: 97.7 F (36.5 C) (!) 97.4 F (36.3 C)  SpO2: 98% 98%    Exam:  Constitutional:   . Appears calm and comfortable ENMT:  . grossly normal hearing  Respiratory:  . CTA bilaterally, no w/r/r.  . Respiratory effort normal.  Cardiovascular:  . RRR, no m/r/g . No LE extremity edema   Musculoskeletal:  . RUE, LUE, RLE, LLE    Moves all extremities to command Psychiatric:  . Mental status o Mood, affect appropriate o Orientated to self, location, month  I have personally reviewed the following:   Today's Data  . Creatinine stable, BMP unremarkable otherwise . Hemoglobin stable at 9.0 platelets stable at 136  Scheduled Meds: . amoxicillin  500 mg Oral Q12H  . atorvastatin  80 mg Oral Daily  . carvedilol  25 mg Oral BID WC  .  Chlorhexidine Gluconate Cloth  6 each Topical Q0600  . clopidogrel  75 mg Oral Daily  . dorzolamide-timolol  1 drop Both Eyes BID  . heparin  5,000 Units Subcutaneous Q8H  . hydrALAZINE  25 mg Oral Q8H  . insulin aspart  0-5 Units Subcutaneous QHS  . insulin aspart  0-9 Units Subcutaneous TID WC  . pantoprazole  40 mg Oral Daily   Continuous Infusions:   Principal Problem:   Weakness Active Problems:   Insulin dependent type 2 diabetes mellitus ([place])   Hypertension   History of  stroke   Acute lower UTI   Hypothermia   LOS: 0 days   How to contact the [facility] Attending or Consulting provider [place] or covering provider during after hours [place], for this patient?  1. Check the care team in [facility] and look for a) attending/consulting TRH provider listed and b) the [facility] team listed 2. Log into [email or web address] and use [place]'s universal password to access. If you do not have the password, please contact the hospital operator. 3. Locate the [facility] provider you are looking for under [facility] and [place] to a number that you can be directly reached. 4. If you still have difficulty reaching the provider, please [place] the [facility] (Director on Call) for the Hospitalists listed on amion for assistance.

## 2020-11-25 NOTE — Progress Notes (Signed)
PHARMACY - PHYSICIAN COMMUNICATION CRITICAL VALUE ALERT - BLOOD CULTURE IDENTIFICATION (BCID)  Desiree Pollard is an 74 y.o. female who presented to [facility] on [facility]/23/2020 with a chief complaint of lethargy, fatigue, hypothermia  Assessment:  Pt growing staph species in 1/2 blood culture sets. Likely contaminant  Name of physician (or Provider) Contacted: Dr. Marlowe Sax  Current antibiotics: Rocephin 1gm IV q24h  Changes to prescribed antibiotics recommended:  No antibiotic changes needed  Results for orders placed or performed during the hospital encounter of 11/23/20  Blood Culture ID Panel (Reflexed) (Collected: 11/24/2020  9:44 AM)  Result Value Ref Range   Enterococcus faecalis NOT DETECTED NOT DETECTED   Enterococcus Faecium NOT DETECTED NOT DETECTED   Listeria monocytogenes NOT DETECTED NOT DETECTED   Staphylococcus species DETECTED (A) NOT DETECTED   Staphylococcus aureus (BCID) NOT DETECTED NOT DETECTED   Staphylococcus epidermidis NOT DETECTED NOT DETECTED   Staphylococcus lugdunensis NOT DETECTED NOT DETECTED   Streptococcus species NOT DETECTED NOT DETECTED   Streptococcus agalactiae NOT DETECTED NOT DETECTED   Streptococcus pneumoniae NOT DETECTED NOT DETECTED   Streptococcus pyogenes NOT DETECTED NOT DETECTED   A.calcoaceticus-baumannii NOT DETECTED NOT DETECTED   Bacteroides fragilis NOT DETECTED NOT DETECTED   Enterobacterales NOT DETECTED NOT DETECTED   Enterobacter cloacae complex NOT DETECTED NOT DETECTED   Escherichia coli NOT DETECTED NOT DETECTED   Klebsiella aerogenes NOT DETECTED NOT DETECTED   Klebsiella oxytoca NOT DETECTED NOT DETECTED   Klebsiella pneumoniae NOT DETECTED NOT DETECTED   Proteus species NOT DETECTED NOT DETECTED   Salmonella species NOT DETECTED NOT DETECTED   Serratia marcescens NOT DETECTED NOT DETECTED   Haemophilus influenzae NOT DETECTED NOT DETECTED   Neisseria meningitidis NOT DETECTED NOT DETECTED   Pseudomonas aeruginosa NOT  DETECTED NOT DETECTED   Stenotrophomonas maltophilia NOT DETECTED NOT DETECTED   Candida albicans NOT DETECTED NOT DETECTED   Candida auris NOT DETECTED NOT DETECTED   Candida glabrata NOT DETECTED NOT DETECTED   Candida krusei NOT DETECTED NOT DETECTED   Candida parapsilosis NOT DETECTED NOT DETECTED   Candida tropicalis NOT DETECTED NOT DETECTED   Cryptococcus neoformans/gattii NOT DETECTED NOT DETECTED    Sherlon Handing, PharmD, BCPS Please see amion for complete clinical pharmacist phone list 11/25/2020  6:22 AM

## 2020-11-26 DIAGNOSIS — N390 Urinary tract infection, site not specified: Secondary | ICD-10-CM | POA: Diagnosis not present

## 2020-11-26 DIAGNOSIS — T68XXXA Hypothermia, initial encounter: Secondary | ICD-10-CM | POA: Diagnosis not present

## 2020-11-26 DIAGNOSIS — N184 Chronic kidney disease, stage 4 (severe): Secondary | ICD-10-CM | POA: Diagnosis not present

## 2020-11-26 DIAGNOSIS — E119 Type 2 diabetes mellitus without complications: Secondary | ICD-10-CM | POA: Diagnosis not present

## 2020-11-26 DIAGNOSIS — D631 Anemia in chronic kidney disease: Secondary | ICD-10-CM

## 2020-11-26 LAB — GLUCOSE, CAPILLARY
Glucose-Capillary: 74 mg/dL (ref 70–99)
Glucose-Capillary: 117 mg/dL — ABNORMAL HIGH (ref 70–99)
Glucose-Capillary: 86 mg/dL (ref 70–99)
Glucose-Capillary: 80 mg/dL (ref 70–99)

## 2020-11-26 LAB — URINE CULTURE: Culture: >=100000 — AB

## 2020-11-26 MED ORDER — AMOXICILLIN 500 MG PO CAPS: 500.0000 mg | ORAL_CAPSULE | Freq: Two times a day (BID) | ORAL | Status: DC

## 2020-11-26 MED ORDER — HYDRALAZINE HCL 25 MG PO TABS: 25.0000 mg | ORAL_TABLET | ORAL | Status: DC

## 2020-11-26 NOTE — NC FL2 (Signed)
[place] LEVEL OF CARE SCREENING TOOL     IDENTIFICATION  Patient Name: Desiree Pollard Birthdate: 05-14-1946 Sex: female Admission Date (Current Location): 11/23/2020  [facility] and [place] Number:      Facility and Address:  The . [facility], [place] [address] [place], [place] [address]      Provider Number: [id number]  Attending Physician Name and Address:  Samuella Cota, MD  Relative Name and Phone Number:  Trudi Ida, sister, [place]    Current Level of Care: Hospital Recommended Level of Care: [place] Prior Approval Number:    Date Approved/Denied:   PASRR Number: [id number]  Discharge Plan: SNF    Current Diagnoses: Patient Active Problem List   Diagnosis Date Noted  . 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 CKD (chronic kidney disease), stage IV ([place]) 11/26/2020  . Acute metabolic encephalopathy 63/12/6008  . Generalized weakness 11/24/2020  . Acute lower UTI 11/24/2020  . Hypothermia 11/24/2020  . ARF (acute renal failure) ([place]) 10/19/2020  . Dizziness 10/19/2020  . Thrombocytopenia ([place]) 10/19/2020  . Chronic diarrhea 09/18/2020  . Loss of weight 09/18/2020  . Dysphagia 09/18/2020  . Insulin dependent type 2 diabetes mellitus ([place]) 06/16/2020  . Hemoglobin A1c less than 7.0% 06/16/2020  . Hyperglycemia 06/16/2020  . Hypertension 06/16/2020  . History of stroke 06/16/2020    Orientation RESPIRATION BLADDER Height & Weight     Self,Time,Situation,Place  Normal Incontinent,External catheter Weight: 150 lb 2.1 oz (68.1 kg) Height:  5\' 7"  (170.2 cm)  BEHAVIORAL SYMPTOMS/MOOD NEUROLOGICAL BOWEL NUTRITION STATUS      Incontinent Diet (Please see DC Summary)  AMBULATORY STATUS COMMUNICATION OF NEEDS Skin   Extensive Assist Verbally Normal                       Personal Care Assistance Level of Assistance  Bathing,Feeding,Dressing Bathing Assistance: Limited  assistance Feeding assistance: Limited assistance       Functional Limitations Info  Sight,Hearing,Speech Sight Info: Adequate Hearing Info: Adequate Speech Info: Adequate    SPECIAL CARE FACTORS FREQUENCY  PT (By licensed PT),OT (By licensed OT)     PT Frequency: 5x/week OT Frequency: 5x/week            Contractures Contractures Info: Not present    Additional Factors Info  Code Status,Allergies,Insulin Sliding Scale Code Status Info: Full Allergies Info: Amlodipine   Insulin Sliding Scale Info: See dc summary       Current Medications (11/26/2020):  This is the current hospital active medication list Current Facility-Administered Medications  Medication Dose Route Frequency Provider Last Rate Last Admin  . acetaminophen (TYLENOL) tablet 650 mg  650 mg Oral Q6H PRN Vashti Hey, MD       Or  . acetaminophen (TYLENOL) suppository 650 mg  650 mg Rectal Q6H PRN Bonnell Public Tublu, MD      . amoxicillin (AMOXIL) capsule 500 mg  500 mg Oral Q12H Samuella Cota, MD   500 mg at 11/26/20 0906  . atorvastatin (LIPITOR) tablet 80 mg  80 mg Oral Daily Bonnell Public Tublu, MD   80 mg at 11/26/20 0906  . carvedilol (COREG) tablet 25 mg  25 mg Oral BID WC Bonnell Public Tublu, MD   25 mg at 11/26/20 0906  . Chlorhexidine Gluconate Cloth 2 % PADS 6 each  6 each Topical Q0600 Vashti Hey, MD   6 each at 11/26/20 0908  .  clopidogrel (PLAVIX) tablet 75 mg  75 mg Oral Daily Bonnell Public Tublu, MD   75 mg at 11/26/20 0905  . dorzolamide-timolol (COSOPT) 22.3-6.8 MG/ML ophthalmic solution 1 drop  1 drop Both Eyes BID Bonnell Public Tublu, MD   1 drop at 11/26/20 0908  . heparin injection 5,000 Units  5,000 Units Subcutaneous Q8H Bonnell Public Tublu, MD   5,000 Units at 11/26/20 1417  . hydrALAZINE (APRESOLINE) tablet 25 mg  25 mg Oral Q8H Bonnell Public Tublu, MD   25 mg at 11/26/20 1417  . insulin aspart (novoLOG)  injection 0-5 Units  0-5 Units Subcutaneous QHS Bonnell Public Tublu, MD      . insulin aspart (novoLOG) injection 0-9 Units  0-9 Units Subcutaneous TID WC Bonnell Public Tublu, MD      . pantoprazole (PROTONIX) EC tablet 40 mg  40 mg Oral Daily Samuella Cota, MD   40 mg at 11/26/20 3202     Discharge Medications: Please see discharge summary for a list of discharge medications.  Relevant Imaging Results:  Relevant Lab Results:   Additional Information SS# [place], [place]

## 2020-11-26 NOTE — TOC Transition Note (Signed)
Transition of Care [facility]) - CM/SW Discharge Note   Patient Details  Name: Desiree Pollard MRN: [id number] Date of Birth: 07-30-1946  [facility]) CM/SW Contact:  Benard Halsted, LCSW Phone Number: 11/26/2020, 4:26 PM   Clinical Narrative:    Patient will DC to: [facility] Anticipated DC date: 11/26/20 Family notified: Sister, Ms. Cheek Transport by: Corey Harold   Per MD patient ready for DC to [place]. RN to call report prior to discharge [phone] Room 206A). RN, patient, patient's family, and facility notified of DC. Discharge Summary and FL2 sent to facility. DC packet on chart. Ambulance transport requested for patient.   CSW will sign off for now as social work intervention is no longer needed. Please consult [place] again if new needs arise.        Barriers to Discharge: Barriers Resolved   Patient Goals and CMS Choice Patient states their goals for this hospitalization and ongoing recovery are:: Return to rehab CMS Medicare.gov Compare Post Acute Care list provided to:: Patient Represenative (must comment) Choice offered to / list presented to : [place]  Discharge Placement   Existing PASRR number confirmed : 11/26/20          Patient chooses bed at: [facility] Patient to be transferred to facility by: [place] Name of family member notified: Sister, Ms. Cheek Patient and family notified of of transfer: 11/26/20  Discharge Plan and Services In-house Referral: Clinical Social Work   Post Acute Care Choice: [place]                               Social Determinants of Health (SDOH) Interventions     Readmission Risk Interventions No flowsheet data found.

## 2020-11-26 NOTE — Progress Notes (Signed)
Attempted to call report to [facility] for patient D/C. RN asked to give report on pt going to facility was asked "do I have the authorization for the pt to come" informed caller RN was not sure what that meant. RN was disconnected from call. RN attempted to call back x2 with no answer.

## 2020-11-26 NOTE — Evaluation (Signed)
Physical Therapy Evaluation Patient Details Name: Desiree Pollard MRN: [id number] DOB: 11-Jan-1946 Today's Date: 11/26/2020   History of Present Illness  74yo female with recent CVA who discharged to [facility] 11/15, now returns with significant fatigue. Hypothermic and hypertensive in the ED. Admitted with UTI, acute metabolic encephalopathy. PMH breast CA, CVA, DM with retinopathy, PPM  Clinical Impression   Patient received in bed, pleasant and cooperative during PT session. Continues to require heavy levels of physical assistance for all aspects of mobility and balance, and fatigues quite easily. Still has residual weakness and impaired proprioception/coordination L UE/LE as well as difficulty with weight shifting for transfers/pre-gait tasks. Left up in recliner with all needs met, chair alarm active. Will benefit from return to SNF once medically ready.     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, blind R eye Restrictions Weight Bearing Restrictions: No      Mobility  Bed Mobility Overal bed mobility: Needs Assistance Bed Mobility: Rolling;Sidelying to Sit Rolling: Min assist Sidelying to sit: Mod assist       General bed mobility comments: cues for rolling onto her R side with use of rail, then ModA to raelly bring her trunk up to midline and scoot hips forward to get feet on floor    Transfers Overall transfer level: Needs assistance Equipment used: Rolling walker (2 wheeled) Transfers: Sit to/from [facility] Sit to Stand: Max assist Stand pivot transfers: Mod assist       General transfer comment: MaxA to really boost up to standing and gain balance, then able to pivot to recliner on R side with  ModA/Mod cues for technique; did need short burst of MaxA at end of transfer to rotate hips all the way into chair as she did start sitting prematurely due to fatigue  Ambulation/Gait             General Gait Details: deferred  Stairs            Wheelchair Mobility    Modified Rankin (Stroke Patients Only)       Balance Overall balance assessment: Needs assistance Sitting-balance support: Feet supported;No upper extremity supported Sitting balance-Leahy Scale: Fair Sitting balance - Comments: S for midline static sitting at EOB   Standing balance support: Bilateral upper extremity supported;During functional activity Standing balance-Leahy Scale: Poor Standing balance comment: reliant on external support, easily fatigued                             Pertinent Vitals/Pain Pain Assessment: No/denies pain Faces Pain Scale: No hurt Pain Intervention(s): Monitored during session    Home Living Family/patient expects to be discharged to:: Skilled nursing facility                 Additional Comments: reports she walks short distances with therapy and RW/"a lot of help"    Prior Function                 Hand Dominance        Extremity/Trunk Assessment   Upper Extremity Assessment Upper Extremity Assessment: Defer to OT evaluation    Lower Extremity Assessment Lower Extremity Assessment: Generalized weakness       Communication   Communication: No difficulties  Cognition Arousal/Alertness: Awake/alert Behavior During Therapy: Flat affect Overall Cognitive Status: Impaired/Different from baseline Area of Impairment: Safety/judgement;Problem solving;Awareness                         Safety/Judgement: Decreased awareness of deficits;Decreased awareness of safety Awareness: Emergent Problem Solving: Slow processing;Decreased initiation;Difficulty sequencing;Requires verbal cues General Comments: A&Ox4, followed commands  well, does have some mild issues with sequencing and functional problem solving requiring multimodal cues      General Comments      Exercises     Assessment/Plan    PT Assessment Patient needs continued PT services  PT Problem List Decreased strength;Decreased mobility;Decreased balance;Decreased knowledge of use of DME;Decreased activity tolerance;Decreased safety awareness;Decreased coordination       PT Treatment Interventions DME instruction;Therapeutic activities;Therapeutic exercise;Patient/family education;Gait training;Balance training;Functional mobility training;Neuromuscular re-education    PT Goals (Current goals can be found in the Care Plan section)  Acute Rehab PT Goals Patient Stated Goal: go back to SNF for more rehab PT Goal Formulation: With patient Time For Goal Achievement: 12/10/20 Potential to Achieve Goals: Good    Frequency Min 2X/week   Barriers to discharge        Co-evaluation               AM-PAC PT "6 Clicks" Mobility  Outcome Measure Help needed turning from your back to your side while in a flat bed without using bedrails?: A Little Help needed moving from lying on your back to sitting on the side of a flat bed without using bedrails?: A Lot Help needed moving to and from a bed to a chair (including a wheelchair)?: A Lot Help needed standing up from a chair using your arms (e.g., wheelchair or bedside chair)?: A Lot Help needed to walk in hospital room?: Total Help needed climbing 3-5 steps with a railing? : Total 6 Click Score: 11    End of Session Equipment Utilized During Treatment: Gait belt Activity Tolerance: Patient tolerated treatment well Patient left: in chair;with call bell/phone within reach;with chair alarm set Nurse Communication: Mobility status PT Visit Diagnosis: Other abnormalities of gait and mobility (R26.89);Muscle weakness (generalized) (M62.81);Dizziness and giddiness (R42);Unsteadiness on feet  (R26.81);Difficulty in walking, not elsewhere classified (R26.2)    Time: 0258-5277 PT Time Calculation (min) (ACUTE ONLY): 31 min   Charges:   PT Evaluation $PT Eval Moderate Complexity: 1 Mod PT Treatments $Therapeutic Activity: 8-22 mins        Windell Norfolk, DPT, PN1   Supplemental Physical Therapist [place]    Pager [phone] Acute Rehab Office [phone]

## 2020-11-26 NOTE — TOC Progression Note (Signed)
Transition of Care [facility]) - Progression Note    Patient Details  Name: Desiree Pollard MRN: [id number] Date of Birth: March 20, 1946  [facility]) CM/SW [place], LCSW Phone Number: 11/26/2020, 4:09 PM  Clinical Narrative:    [name] willing to accept patient with pending insurance approval. CSW will arrange PTAR and make sister aware.    Expected Discharge Plan: [place] Barriers to Discharge: Insurance Authorization  Expected Discharge Plan and Services Expected Discharge Plan: [place] In-house Referral: Clinical Social Work   Post Acute Care Choice: [place] Living arrangements for the past 2 months: [place] Expected Discharge Date: 11/26/20                                     Social Determinants of Health (SDOH) Interventions    Readmission Risk Interventions No flowsheet data found.

## 2020-11-26 NOTE — Progress Notes (Signed)
CSW received consult that patient is from [facility]. CSW contacted [place] and they reported patient has been there for short term rehab. They require insurance authorization prior to her return. CSW will follow up with the patient.   Leane Loring LCSW

## 2020-11-26 NOTE — TOC Initial Note (Signed)
Transition of Care [facility]) - Initial/Assessment Note    Patient Details  Name: Desiree Pollard MRN: [id number] Date of Birth: October 05, 1946  Transition of Care [facility]) CM/SW Contact:    Benard Halsted, LCSW Phone Number: 11/26/2020, 3:40 PM  Clinical Narrative:                 CSW spoke with patient's sister, Ms. Cheek, and made her aware that patient's insurance is pending approval for return to [place]. She expressed understanding and stated that she had just appealed at the facility and won so is hopeful the authorization will come through.   Expected Discharge Plan: Skilled Nursing Facility Barriers to Discharge: Insurance Authorization   Patient Goals and CMS Choice Patient states their goals for this hospitalization and ongoing recovery are:: Return to rehab CMS Medicare.gov Compare Post Acute Care list provided to:: Patient Represenative (must comment) Choice offered to / list presented to : [name]  Expected Discharge Plan and Services Expected Discharge Plan: [name] In-house Referral: Clinical Social Work   Post Acute Care Choice: [place] Living arrangements for the past 2 months: [name] Expected Discharge Date: 11/26/20                                    Prior Living Arrangements/Services Living arrangements for the past 2 months: [name] Lives with:: Adult Children Patient language and need for interpreter reviewed:: Yes Do you feel safe going back to the place where you live?: Yes      Need for Family Participation in Patient Care: Yes (Comment) Care giver support system in place?: Yes (comment)   Criminal Activity/Legal Involvement Pertinent to Current Situation/Hospitalization: No - Comment as needed  Activities of Daily Living   ADL Screening (condition at time of admission) Patient's cognitive ability adequate to safely complete daily  activities?: Yes Is the patient deaf or have difficulty hearing?: No Does the patient have difficulty seeing, even when wearing glasses/contacts?: Yes Does the patient have difficulty concentrating, remembering, or making decisions?: Yes Patient able to express need for assistance with ADLs?: Yes Does the patient have difficulty dressing or bathing?: Yes Independently performs ADLs?: No Communication: Independent Dressing (OT): Needs assistance Is this a change from baseline?: Change from baseline, expected to last >3 days Grooming: Independent Feeding: Independent Bathing: Needs assistance Is this a change from baseline?: Change from baseline, expected to last >3 days Toileting: Dependent Is this a change from baseline?: Change from baseline, expected to last >3days In/Out Bed: Needs assistance Is this a change from baseline?: Change from baseline, expected to last >3 days Walks in Home: Needs assistance Is this a change from baseline?: Change from baseline, expected to last >3 days Does the patient have difficulty walking or climbing stairs?: Yes Weakness of Legs: Both Weakness of Arms/Hands: Both  Permission Sought/Granted Permission sought to share information with : Facility Contact Representative,Family Supports Permission granted to share information with : Yes, Verbal Permission Granted  Share Information with NAME: [name]  Permission granted to share info w AGENCY: [name]  Permission granted to share info w Relationship: Sister  Permission granted to share info w Contact Information: [phone]  Emotional Assessment Appearance:: Appears stated age Attitude/Demeanor/Rapport: Engaged Affect (typically observed): Appropriate Orientation: : Oriented to Self,Oriented to Place,Oriented to  Time,Oriented to Situation Alcohol / Substance Use: Not Applicable Psych Involvement: No (comment)  Admission  diagnosis:  Acute cystitis with hematuria [C88.33] Acute metabolic  encephalopathy [V44.51] Patient Active Problem List   Diagnosis Date Noted  . 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 CKD (chronic kidney disease), stage IV ([place]) 11/26/2020  . Acute metabolic encephalopathy 46/03/7997  . Generalized weakness 11/24/2020  . Acute lower UTI 11/24/2020  . Hypothermia 11/24/2020  . ARF (acute renal failure) ([place]) 10/19/2020  . Dizziness 10/19/2020  . Thrombocytopenia ([place]) 10/19/2020  . Chronic diarrhea 09/18/2020  . Loss of weight 09/18/2020  . Dysphagia 09/18/2020  . Insulin dependent type 2 diabetes mellitus ([place]) 06/16/2020  . Hemoglobin A1c less than 7.0% 06/16/2020  . Hyperglycemia 06/16/2020  . Hypertension 06/16/2020  . History of stroke 06/16/2020   PCP:  Patient, No Pcp Per Pharmacy:   [id number] - [place], [place]. [address] [address] Phone: [phone] Fax: [phone]     Social Determinants of Health (SDOH) Interventions    Readmission Risk Interventions No flowsheet data found.

## 2020-11-26 NOTE — Plan of Care (Signed)
  Problem: Clinical Measurements: Goal: Will remain free from infection Outcome: Progressing Goal: Respiratory complications will improve Outcome: Progressing   Problem: Nutrition: Goal: Adequate nutrition will be maintained Outcome: Progressing   Problem: Pain Managment: Goal: General experience of comfort will improve Outcome: Progressing   

## 2020-11-26 NOTE — Discharge Summary (Signed)
Physician Discharge Summary  Vermont Maynes MAY:[id number] DOB: August 07, 1946 DOA: 11/23/2020  PCP: Patient, No Pcp Per  Admit date: 11/23/2020 Discharge date: 11/26/2020  Recommendations for Outpatient Follow-up:  1. Follow-up w/ facility physician or PCP within 1 week     Discharge Diagnoses: Principal diagnosis is #1 Principal Problem:   Acute lower UTI Active Problems:   Insulin dependent type 2 diabetes mellitus ([place])   Hypertension   History of stroke   Thrombocytopenia (HCC)   Generalized weakness   Hypothermia   Anemia due to chronic kidney disease   CKD (chronic kidney disease), stage IV ([place])   Discharge Condition: imprvoed Disposition: SNF  Diet recommendation:  Diet Orders (From admission, onward)    Start     Ordered   11/25/20 1442  Diet heart healthy/carb modified Room service appropriate? No; Fluid consistency: Thin  Diet effective now       Question Answer Comment  Diet-HS Snack? Nothing   Room service appropriate? No   Fluid consistency: Thin      11/25/20 1442           Filed Weights   11/24/20 0915  Weight: 68.1 kg    HPI/Hospital Course:   74 year old woman recent stroke in November, diabetes mellitus type 2 presented from [place] home with increased fatigue, found to be hypothermic which prompted infectious work-up notable for UTI.  Condition rapidly improved, hypothermia was probably environmental and not reflective of infection.  UTI responded to treatment.  Hospitalization was uncomplicated.  Individual issues as below.  A & P  UTI, possibly associated with hypothermia and generalized weakness --Treated initially with Bair hugger.  Likely environmental in nature.  No signs or symptoms to suggest sepsis.  Lactic acid was within normal limits. --Continue treatment for UTI  Bacteremia, contaminant --Cultures noted.  This was a contaminant.  No further treatment.  Generalized weakness --PT, OT evaluation recommend  SNF.  Trivial troponin elevation --EKG showed paced rhythm.  Unclear why these labs were checked as there were no signs or symptoms to suggest ACS.  No further evaluation suggested.  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  Residual deficits cognitive impairment, left lower facial weakness, mild left hemiparesis with numbness, tingling and gait impairment. --Seen by neurology 12/9, at that time plan was to continue Plavix, atorvastatin. No ASA.  Essential hypertension BP goal less than 130/90 --Stable.  Continue hydralazine and carvedilol  Hyperlipidemia --Continue statin  Anemia of CKD --Stable no further evaluation suggested.  Thrombocytopenia --Mild, intermittent.  Follow intermittently.  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 Appears calm and comfortable ENMT:  . grossly normal hearing  Respiratory:  . CTA bilaterally, no w/r/r.  . Respiratory effort normal.  Cardiovascular:  . RRR, no m/r/g . No LE extremity edema   Musculoskeletal:  . RUE, LUE, RLE, LLE   . Moves all extremities to command Psychiatric:  . Mental status o Mood, affect appropriate o Oriented to person, place, month, year  Culture data noted  Discharge Instructions  Allergies as of 11/26/2020      Reactions   Amlodipine Nausea And Vomiting   Pt refuses to take due to severe n/v that began after starting amlodipine and stopped after discontinuation.      Medication List    STOP taking these medications   insulin lispro 100 UNIT/ML injection Commonly  known as: HUMALOG     TAKE these medications   Accu-Chek FastClix Lancets Misc 1 each by Other route as directed. To test blood glucose daily.   amoxicillin 500 MG capsule Commonly known as: AMOXIL Take 1 capsule (500 mg total) by mouth every 12 (twelve) hours. Last dose 12/16   atorvastatin 80 MG tablet Commonly known as: LIPITOR Take 80 mg by mouth daily.   carvedilol 25 MG tablet Commonly known as: COREG Take 25 mg by mouth 2 (two) times daily with a meal.   clopidogrel 75 MG tablet Commonly known as: PLAVIX Take 1 tablet (75 mg total) by mouth daily.   dorzolamide-timolol 22.3-6.8 MG/ML ophthalmic solution Commonly known as: COSOPT INSTILL 1 DROP INTO BOTH EYES TWICE A DAY What changed: See the new instructions.   ferrous sulfate 325 (65 FE) MG tablet Take 325 mg by mouth every other day.   Fifty50 Glucose Meter 2.0 w/Device Kit 1 each by Other route See admin instructions. Use to check blood sugars   Fifty50 Pen Needles 32G X 4 MM Misc Generic drug: Insulin Pen Needle 1 each by Other route See admin instructions. Use as instructed.   furosemide 10 MG/ML solution Commonly known as: LASIX Take 10 mg by mouth every other day.   hydrALAZINE 25 MG tablet Commonly known as: APRESOLINE Take 1 tablet (25 mg total) by mouth See admin instructions. Take 50 mg by mouth TID.   insulin aspart 100 UNIT/ML injection Commonly known as: novoLOG 0-6 Units, Subcutaneous, 3 times daily with meals CBG < 70: Implement Hypoglycemia measures CBG 70 - 120: 0 units CBG 121 - 150: 0 units CBG 151 - 200: 1 unit CBG 201-250: 2 units CBG 251-300: 3 units CBG 301-350: 4 units CBG 351-400: 5 units CBG > 400: Give 6 units and call MD   loperamide 2 MG tablet Commonly known as: Imodium A-D Take 1 tablet (2 mg total) by mouth 4 (four) times daily as needed for diarrhea or loose stools.   pantoprazole 40 MG tablet Commonly known as: PROTONIX Take 1 tablet (40 mg total) by mouth  daily.      Allergies  Allergen Reactions  . Amlodipine Nausea And Vomiting    Pt refuses to take due to severe n/v that began after starting amlodipine and stopped after discontinuation.    The results of significant diagnostics from this hospitalization (including imaging, microbiology, ancillary and laboratory) are listed below for reference.    Significant Diagnostic Studies: CT Head Wo Contrast  Result Date: 11/24/2020 CLINICAL DATA:  Weakness and dizziness EXAM: CT HEAD WITHOUT CONTRAST TECHNIQUE: Contiguous axial images were obtained from the base of the skull through the vertex without intravenous contrast. COMPARISON:  10/21/2020, 10/18/2020 FINDINGS: Brain: Stable atrophy pattern, scattered white matter microvascular ischemic changes and areas of remote infarct in the right basal ganglia and both cerebellar hemispheres. No acute intracranial hemorrhage, new mass lesion, definite new infarction, midline shift, herniation, mass effect, or extra-axial fluid collection. Cisterns are patent. Stable associated ventricular enlargement. Vascular: Intracranial atherosclerosis at the skull base. No hyperdense vessel. Skull: Normal. Negative for fracture or focal lesion. Sinuses/Orbits: No acute finding. Other: None.  IMPRESSION: Stable atrophy, chronic white matter microvascular ischemic changes and remote areas of infarct as above. No definite acute intracranial abnormality by noncontrast CT. Electronically Signed   By: Jerilynn Mages.  Shick M.D.   On: 11/24/2020 10:03   DG Chest Port 1 View  Result Date: 11/24/2020 CLINICAL DATA:  Weakness. EXAM: PORTABLE CHEST 1 VIEW COMPARISON:  October 18, 2020. FINDINGS: Stable cardiomegaly. 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 No pneumothorax or pleural effusion is noted. Both lungs are clear. The visualized skeletal structures are unremarkable. IMPRESSION: No active disease. Electronically Signed   By: Marijo Conception M.D.   On: 11/24/2020 09:16     Microbiology: Recent Results (from the past 240 hour(s))  Resp Panel by RT-PCR (Flu A&B, Covid) Nasopharyngeal Swab     Status: None   Collection Time: 11/24/20  9:13 AM   Specimen: Nasopharyngeal Swab; Nasopharyngeal(NP) swabs in vial transport medium  Result Value Ref Range Status   SARS Coronavirus 2 by RT PCR NEGATIVE NEGATIVE Final    Comment: (NOTE) SARS-CoV-2 target nucleic acids are NOT DETECTED.  The SARS-CoV-2 RNA is generally detectable in upper respiratory specimens during the acute phase of infection. The lowest concentration of SARS-CoV-2 viral copies this assay can detect is 138 copies/mL. A negative result does not preclude SARS-Cov-2 infection and should not be used as the sole basis for treatment or other patient management decisions. A negative result may occur with  improper specimen collection/handling, submission of specimen other than nasopharyngeal swab, presence of viral mutation(s) within the areas targeted by this assay, and inadequate number of viral copies(<138 copies/mL). A negative result must be combined with clinical observations, patient history, and epidemiological information. The expected result is Negative.  Fact Sheet for Patients:  EntrepreneurPulse.com.au  Fact Sheet for Healthcare Providers:  IncredibleEmployment.be  This test is no t yet approved or cleared by the [place] FDA and  has been authorized for detection and/or diagnosis of SARS-CoV-2 by FDA under an Emergency Use Authorization (EUA). This EUA will remain  in effect (meaning this test can be used) for the duration of the COVID-19 declaration under Section 564(b)(1) of the Act, 21 U.S.C.section 360bbb-3(b)(1), unless the authorization is terminated  or revoked sooner.       Influenza A by PCR NEGATIVE NEGATIVE Final   Influenza B by PCR NEGATIVE NEGATIVE Final    Comment: (NOTE) The Xpert Xpress SARS-CoV-2/FLU/RSV plus assay is  intended as an aid in the diagnosis of influenza from Nasopharyngeal swab specimens and should not be used as a sole basis for treatment. Nasal washings and aspirates are unacceptable for Xpert Xpress SARS-CoV-2/FLU/RSV testing.  Fact Sheet for Patients: EntrepreneurPulse.com.au  Fact Sheet for Healthcare Providers: IncredibleEmployment.be  This test is not yet approved or cleared by the [place] FDA and has been authorized for detection and/or diagnosis of SARS-CoV-2 by FDA under an Emergency Use Authorization (EUA). This EUA will remain in effect (meaning this test can be used) for the duration of the COVID-19 declaration under Section 564(b)(1) of the Act, 21 U.S.C. section 360bbb-3(b)(1), unless the authorization is terminated or revoked.  Performed at [facility], [place] [address] [place], [place] [address]   Urine culture     Status: Abnormal   Collection Time: 11/24/20  9:24 AM   Specimen: In/Out Cath Urine  Result Value Ref Range Status   Specimen Description IN/OUT CATH URINE  Final   Special Requests   Final    Immunocompromised Performed at [facility], [address] [place], [place] [address]    Culture >=100,000 COLONIES/mL ENTEROCOCCUS FAECALIS (A)  Final   Report Status 11/26/2020 FINAL  Final   Organism ID, Bacteria ENTEROCOCCUS FAECALIS (A)  Final      Susceptibility   Enterococcus faecalis - MIC*    AMPICILLIN <=2 SENSITIVE Sensitive     NITROFURANTOIN <=16 SENSITIVE Sensitive     VANCOMYCIN 2 SENSITIVE Sensitive     * >=100,000 COLONIES/mL ENTEROCOCCUS FAECALIS  Blood Culture (routine x 2)     Status: None (Preliminary result)   Collection Time: 11/24/20  9:29 AM   Specimen: BLOOD  Result Value Ref Range Status   Specimen Description BLOOD SITE NOT SPECIFIED  Final   Special Requests   Final    BOTTLES DRAWN AEROBIC ONLY Blood Culture results may not be optimal due to an inadequate volume of blood  received in culture bottles   Culture   Final    NO GROWTH < 24 HOURS Performed at [facility], [place] [address], [place], [place] [address]    Report Status PENDING  Incomplete  Blood Culture (routine x 2)     Status: Abnormal (Preliminary result)   Collection Time: 11/24/20  9:44 AM   Specimen: BLOOD  Result Value Ref Range Status   Specimen Description BLOOD BLOOD LEFT HAND  Final   Special Requests   Final    BOTTLES DRAWN AEROBIC AND ANAEROBIC Blood Culture adequate volume   Culture  Setup Time   Final    IN BOTH AEROBIC AND ANAEROBIC BOTTLES GRAM POSITIVE COCCI Organism ID to follow CRITICAL RESULT CALLED TO, READ BACK BY AND VERIFIED WITH: K AMEND [facility] 11/25/20 0619 JDW    Culture (A)  Final    STAPHYLOCOCCUS HOMINIS THE SIGNIFICANCE OF ISOLATING THIS ORGANISM FROM A SINGLE SET OF BLOOD CULTURES WHEN MULTIPLE SETS ARE DRAWN IS UNCERTAIN. PLEASE NOTIFY THE MICROBIOLOGY DEPARTMENT WITHIN ONE WEEK IF SPECIATION AND SENSITIVITIES ARE REQUIRED. Performed at [facility], [place] [address] [place], [place] [address]    Report Status PENDING  Incomplete  Blood Culture ID Panel (Reflexed)     Status: Abnormal   Collection Time: 11/24/20  9:44 AM  Result Value Ref Range Status   Enterococcus faecalis NOT DETECTED NOT DETECTED Final   Enterococcus Faecium NOT DETECTED NOT DETECTED Final   Listeria monocytogenes NOT DETECTED NOT DETECTED Final   Staphylococcus species DETECTED (A) NOT DETECTED Final    Comment: CRITICAL RESULT CALLED TO, READ BACK BY AND VERIFIED WITH: K AMEND PHARMD 11/25/20 4680 JDW    Staphylococcus aureus (BCID) NOT DETECTED NOT DETECTED Final   Staphylococcus epidermidis NOT DETECTED NOT DETECTED Final   Staphylococcus lugdunensis NOT DETECTED NOT DETECTED Final   Streptococcus species NOT DETECTED NOT DETECTED Final   Streptococcus agalactiae NOT DETECTED NOT DETECTED Final   Streptococcus pneumoniae NOT DETECTED NOT DETECTED Final   Streptococcus  pyogenes NOT DETECTED NOT DETECTED Final   A.calcoaceticus-baumannii NOT DETECTED NOT DETECTED Final   Bacteroides fragilis NOT DETECTED NOT DETECTED Final   Enterobacterales NOT DETECTED NOT DETECTED Final   Enterobacter cloacae complex NOT DETECTED NOT DETECTED Final   Escherichia coli NOT DETECTED NOT DETECTED Final   Klebsiella aerogenes NOT DETECTED NOT DETECTED Final   Klebsiella oxytoca NOT DETECTED NOT DETECTED Final   Klebsiella pneumoniae NOT DETECTED NOT DETECTED Final   Proteus species NOT DETECTED NOT DETECTED Final   Salmonella species NOT DETECTED NOT DETECTED Final   Serratia  marcescens NOT DETECTED NOT DETECTED Final   Haemophilus influenzae NOT DETECTED NOT DETECTED Final   Neisseria meningitidis NOT DETECTED NOT DETECTED Final   Pseudomonas aeruginosa NOT DETECTED NOT DETECTED Final   Stenotrophomonas maltophilia NOT DETECTED NOT DETECTED Final   Candida albicans NOT DETECTED NOT DETECTED Final   Candida auris NOT DETECTED NOT DETECTED Final   Candida glabrata NOT DETECTED NOT DETECTED Final   Candida krusei NOT DETECTED NOT DETECTED Final   Candida parapsilosis NOT DETECTED NOT DETECTED Final   Candida tropicalis NOT DETECTED NOT DETECTED Final   Cryptococcus neoformans/gattii NOT DETECTED NOT DETECTED Final    Comment: Performed at [facility], [place] [address], [place], [place] [address]     Labs: Basic Metabolic Panel: Recent Labs  Lab 11/23/20 2205 11/24/20 1902 11/25/20 0448  NA 146*  --  145  K 3.4*  --  3.9  CL 116*  --  117*  CO2 21*  --  20*  GLUCOSE 121*  --  102*  BUN 18  --  17  CREATININE 2.79* 2.61* 2.58*  CALCIUM 8.9  --  9.0   Liver Function Tests: Recent Labs  Lab 11/23/20 2205  AST 22  ALT 24  ALKPHOS 96  BILITOT 0.4  PROT 5.8*  ALBUMIN 2.5*   Recent Labs  Lab 11/24/20 0913  LIPASE 22   CBC: Recent Labs  Lab 11/23/20 2205 11/24/20 1902 11/25/20 0448  WBC 4.9 6.1 6.4  NEUTROABS 3.4  --   --   HGB 8.7* 9.1*  9.0*  HCT 27.7* 30.3* 30.0*  MCV 92.6 92.9 93.2  PLT 146* 139* 136*   CBG: Recent Labs  Lab 11/25/20 0629 11/25/20 1734 11/25/20 2126 11/26/20 0606 11/26/20 1307  GLUCAP 87 75 114* 74 117*    Principal Problem:   Acute lower UTI Active Problems:   Insulin dependent type 2 diabetes mellitus ([place])   Hypertension   History of stroke   Thrombocytopenia (HCC)   Generalized weakness   Hypothermia   Anemia due to chronic kidney disease   CKD (chronic kidney disease), stage IV ([place])   Time coordinating discharge: 35 minutes  Signed:  Murray Hodgkins, MD  [facility]  11/26/2020, 3:19 PM

## 2020-11-26 NOTE — Evaluation (Signed)
Occupational Therapy Evaluation Patient Details Name: Desiree Pollard MRN: [id number] DOB: Jan 09, 1946 Today's Date: 11/26/2020    History of Present Illness 74yo female with recent CVA who discharged to [facility] 11/15, now returns with significant fatigue. Hypothermic and hypertensive in the ED. Admitted with UTI, acute metabolic encephalopathy. PMH breast CA, CVA, DM with retinopathy, PPM   Clinical Impression   Patient from SNF where she was active with therapy services after recent admission for CVA with residual L-sided weakness. Patient currently presents with deficits in static/dynamic standing balance, strength, functional transfers, activity tolerance and endurance indicating continued need for skilled acute OT services to maximize safety, independence and decrease caregiver burden. Recommendation for return to SNF when medically able.     Follow Up Recommendations  SNF    Equipment Recommendations  None recommended by OT    Recommendations for Other Services       Precautions / Restrictions Precautions Precautions: Fall;ICD/Pacemaker Precaution Comments: residual L weakness from CVA, blind R eye Restrictions Weight Bearing Restrictions: No      Mobility Bed Mobility Overal bed mobility: Needs Assistance Bed Mobility: Sit to Supine       Sit to supine: Min assist   General bed mobility comments: Patient able to lower trunk but required assist to advance BLE from EOB to bed surface.    Transfers Overall transfer level: Needs assistance Equipment used: Rolling walker (2 wheeled) Transfers: Sit to/from [facility] Sit to Stand: Mod assist;Max assist Stand pivot transfers: Mod assist;Max assist       General transfer comment: Mod to Max A for sit to stand transfers with assist to boost and for balance. Mod A to Max A for stand-pivot transfer with cues for upright posture and sequencing. Difficulty clearing BLE progressing to requiring Max A at end of  transfer.    Balance Overall balance assessment: Needs assistance Sitting-balance support: Feet supported;No upper extremity supported Sitting balance-Leahy Scale: Fair     Standing balance support: Bilateral upper extremity supported;During functional activity Standing balance-Leahy Scale: Poor Standing balance comment: reliant on external support, easily fatigued                           ADL either performed or assessed with clinical judgement   ADL Overall ADL's : Needs assistance/impaired Eating/Feeding: Set up                       Toilet Transfer: Moderate assistance;RW Toilet Transfer Details (indicate cue type and reason): Simulated with stand-pivot transfer to EOB. Heavy multimodal cues for sequencing. Patient shuffles feet 2/2 difficulty clearing BLE.                 Vision Baseline Vision/History: Wears glasses Patient Visual Report: No change from baseline Vision Assessment?: Vision impaired- to be further tested in functional context Additional Comments: Visual deficits at baseline.     Perception     Praxis      Pertinent Vitals/Pain Pain Assessment: No/denies pain Pain Intervention(s): Monitored during session     Hand Dominance     Extremity/Trunk Assessment Upper Extremity Assessment Upper Extremity Assessment: Generalized weakness   Lower Extremity Assessment Lower Extremity Assessment: Generalized weakness       Communication Communication Communication: No difficulties   Cognition Arousal/Alertness: Awake/alert Behavior During Therapy: Flat affect Overall Cognitive Status: Impaired/Different from baseline Area of Impairment: Safety/judgement;Problem solving;Awareness  Safety/Judgement: Decreased awareness of deficits;Decreased awareness of safety Awareness: Emergent Problem Solving: Slow processing;Decreased initiation;Difficulty sequencing;Requires verbal cues General  Comments: A&Ox4, followed commands well, does have some mild issues with sequencing and functional problem solving requiring multimodal cues   General Comments       Exercises     Shoulder Instructions      Home Living Family/patient expects to be discharged to:: Skilled nursing facility                                 Additional Comments: reports she walks short distances with therapy and RW/"a lot of help"      Prior Functioning/Environment Level of Independence: Needs assistance  Gait / Transfers Assistance Needed: use of RW for mobility short distances with external assist. ADL's / Homemaking Assistance Needed: Assist from nursing staff at SNF. Prior to CVA, occasional assist from family 2/2 visual deficits.            OT Problem List: Decreased strength;Decreased activity tolerance;Impaired balance (sitting and/or standing);Impaired vision/perception;Decreased cognition;Decreased safety awareness;Decreased knowledge of use of DME or AE;Decreased knowledge of precautions      OT Treatment/Interventions: Self-care/ADL training;Therapeutic exercise;Neuromuscular education;Energy conservation;DME and/or AE instruction;Therapeutic activities;Cognitive remediation/compensation    OT Goals(Current goals can be found in the care plan section) Acute Rehab OT Goals Patient Stated Goal: go back to SNF for more rehab OT Goal Formulation: With patient Time For Goal Achievement: 12/10/20 Potential to Achieve Goals: Good ADL Goals Pt Will Perform Grooming: with set-up;sitting Pt Will Perform Upper Body Dressing: with set-up;sitting Pt Will Perform Lower Body Dressing: with min assist;sit to/from stand Pt Will Transfer to Toilet: with min assist;bedside commode;ambulating Pt Will Perform Toileting - Clothing Manipulation and hygiene: with min assist;sit to/from stand  OT Frequency: Min 2X/week   Barriers to D/C:            Co-evaluation              AM-PAC  OT "6 Clicks" Daily Activity     Outcome Measure Help from another person eating meals?: None Help from another person taking care of personal grooming?: A Little Help from another person toileting, which includes using toliet, bedpan, or urinal?: A Lot Help from another person bathing (including washing, rinsing, drying)?: A Lot Help from another person to put on and taking off regular upper body clothing?: A Little Help from another person to put on and taking off regular lower body clothing?: A Lot 6 Click Score: 16   End of Session Equipment Utilized During Treatment: Gait belt;Rolling walker  Activity Tolerance: Patient tolerated treatment well Patient left: in bed;with call bell/phone within reach;with bed alarm set  OT Visit Diagnosis: Unsteadiness on feet (R26.81);Muscle weakness (generalized) (M62.81);Low vision, both eyes (H54.2);Other symptoms and signs involving cognitive function                Time: 1352-1405 OT Time Calculation (min): 13 min Charges:  OT General Charges $OT Visit: 1 Visit OT Evaluation $OT Eval Moderate Complexity: 1 Mod  Pasqual Farias H. OTR/L Supplemental OT, Department of rehab services [phone]  Sena Hoopingarner R H. 11/26/2020, 2:27 PM

## 2020-11-27 LAB — CULTURE, BLOOD (ROUTINE X 2): Special Requests: ADEQUATE

## 2020-11-29 LAB — CULTURE, BLOOD (ROUTINE X 2): Culture: NO GROWTH

## 2021-01-02 ENCOUNTER — Other Ambulatory Visit: Payer: Self-pay

## 2021-01-02 ENCOUNTER — Emergency Department (HOSPITAL_COMMUNITY): Payer: Medicare HMO

## 2021-01-02 ENCOUNTER — Inpatient Hospital Stay (HOSPITAL_COMMUNITY)
Admission: EM | Admit: 2021-01-02 | Discharge: 2021-02-19 | DRG: 177 | Disposition: A | Discharge status: Skilled Nursing Facility | Payer: Medicare HMO | Source: Skilled Nursing Facility | Attending: Internal Medicine | Admitting: Internal Medicine

## 2021-01-02 ENCOUNTER — Encounter (HOSPITAL_COMMUNITY): Payer: Self-pay

## 2021-01-02 DIAGNOSIS — D72819 Decreased white blood cell count, unspecified: Secondary | ICD-10-CM | POA: Diagnosis present

## 2021-01-02 DIAGNOSIS — E872 Acidosis: Secondary | ICD-10-CM | POA: Diagnosis present

## 2021-01-02 DIAGNOSIS — I130 Hypertensive heart and chronic kidney disease with heart failure and stage 1 through stage 4 chronic kidney disease, or unspecified chronic kidney disease: Secondary | ICD-10-CM | POA: Diagnosis present

## 2021-01-02 DIAGNOSIS — Z0189 Encounter for other specified special examinations: Secondary | ICD-10-CM

## 2021-01-02 DIAGNOSIS — E878 Other disorders of electrolyte and fluid balance, not elsewhere classified: Secondary | ICD-10-CM | POA: Diagnosis not present

## 2021-01-02 DIAGNOSIS — Z853 Personal history of malignant neoplasm of breast: Secondary | ICD-10-CM

## 2021-01-02 DIAGNOSIS — Z8041 Family history of malignant neoplasm of ovary: Secondary | ICD-10-CM

## 2021-01-02 DIAGNOSIS — U071 COVID-19: Secondary | ICD-10-CM | POA: Diagnosis not present

## 2021-01-02 DIAGNOSIS — J9811 Atelectasis: Secondary | ICD-10-CM | POA: Diagnosis present

## 2021-01-02 DIAGNOSIS — D61818 Other pancytopenia: Secondary | ICD-10-CM | POA: Diagnosis present

## 2021-01-02 DIAGNOSIS — E861 Hypovolemia: Secondary | ICD-10-CM | POA: Diagnosis present

## 2021-01-02 DIAGNOSIS — R401 Stupor: Secondary | ICD-10-CM

## 2021-01-02 DIAGNOSIS — Z8673 Personal history of transient ischemic attack (TIA), and cerebral infarction without residual deficits: Secondary | ICD-10-CM

## 2021-01-02 DIAGNOSIS — Z9013 Acquired absence of bilateral breasts and nipples: Secondary | ICD-10-CM

## 2021-01-02 DIAGNOSIS — Z833 Family history of diabetes mellitus: Secondary | ICD-10-CM

## 2021-01-02 DIAGNOSIS — R1312 Dysphagia, oropharyngeal phase: Secondary | ICD-10-CM | POA: Diagnosis present

## 2021-01-02 DIAGNOSIS — E860 Dehydration: Secondary | ICD-10-CM | POA: Diagnosis present

## 2021-01-02 DIAGNOSIS — E11319 Type 2 diabetes mellitus with unspecified diabetic retinopathy without macular edema: Secondary | ICD-10-CM | POA: Diagnosis present

## 2021-01-02 DIAGNOSIS — Z66 Do not resuscitate: Secondary | ICD-10-CM | POA: Diagnosis not present

## 2021-01-02 DIAGNOSIS — R339 Retention of urine, unspecified: Secondary | ICD-10-CM | POA: Diagnosis present

## 2021-01-02 DIAGNOSIS — J9601 Acute respiratory failure with hypoxia: Secondary | ICD-10-CM | POA: Diagnosis not present

## 2021-01-02 DIAGNOSIS — R7401 Elevation of levels of liver transaminase levels: Secondary | ICD-10-CM | POA: Diagnosis not present

## 2021-01-02 DIAGNOSIS — R4701 Aphasia: Secondary | ICD-10-CM

## 2021-01-02 DIAGNOSIS — H5461 Unqualified visual loss, right eye, normal vision left eye: Secondary | ICD-10-CM | POA: Diagnosis present

## 2021-01-02 DIAGNOSIS — R945 Abnormal results of liver function studies: Secondary | ICD-10-CM

## 2021-01-02 DIAGNOSIS — E8809 Other disorders of plasma-protein metabolism, not elsewhere classified: Secondary | ICD-10-CM | POA: Diagnosis not present

## 2021-01-02 DIAGNOSIS — R7989 Other specified abnormal findings of blood chemistry: Secondary | ICD-10-CM

## 2021-01-02 DIAGNOSIS — R0682 Tachypnea, not elsewhere classified: Secondary | ICD-10-CM

## 2021-01-02 DIAGNOSIS — Z515 Encounter for palliative care: Secondary | ICD-10-CM

## 2021-01-02 DIAGNOSIS — M7989 Other specified soft tissue disorders: Secondary | ICD-10-CM | POA: Diagnosis not present

## 2021-01-02 DIAGNOSIS — E870 Hyperosmolality and hypernatremia: Secondary | ICD-10-CM | POA: Diagnosis present

## 2021-01-02 DIAGNOSIS — E663 Overweight: Secondary | ICD-10-CM | POA: Diagnosis present

## 2021-01-02 DIAGNOSIS — E875 Hyperkalemia: Secondary | ICD-10-CM | POA: Diagnosis not present

## 2021-01-02 DIAGNOSIS — T361X5A Adverse effect of cephalosporins and other beta-lactam antibiotics, initial encounter: Secondary | ICD-10-CM | POA: Diagnosis present

## 2021-01-02 DIAGNOSIS — Z7401 Bed confinement status: Secondary | ICD-10-CM

## 2021-01-02 DIAGNOSIS — R5383 Other fatigue: Secondary | ICD-10-CM

## 2021-01-02 DIAGNOSIS — I69320 Aphasia following cerebral infarction: Secondary | ICD-10-CM

## 2021-01-02 DIAGNOSIS — E876 Hypokalemia: Secondary | ICD-10-CM | POA: Diagnosis not present

## 2021-01-02 DIAGNOSIS — I69354 Hemiplegia and hemiparesis following cerebral infarction affecting left non-dominant side: Secondary | ICD-10-CM

## 2021-01-02 DIAGNOSIS — R0602 Shortness of breath: Secondary | ICD-10-CM

## 2021-01-02 DIAGNOSIS — L89159 Pressure ulcer of sacral region, unspecified stage: Secondary | ICD-10-CM | POA: Diagnosis present

## 2021-01-02 DIAGNOSIS — Z7902 Long term (current) use of antithrombotics/antiplatelets: Secondary | ICD-10-CM

## 2021-01-02 DIAGNOSIS — R0989 Other specified symptoms and signs involving the circulatory and respiratory systems: Secondary | ICD-10-CM

## 2021-01-02 DIAGNOSIS — G9341 Metabolic encephalopathy: Secondary | ICD-10-CM | POA: Diagnosis present

## 2021-01-02 DIAGNOSIS — N184 Chronic kidney disease, stage 4 (severe): Secondary | ICD-10-CM | POA: Diagnosis present

## 2021-01-02 DIAGNOSIS — R1310 Dysphagia, unspecified: Secondary | ICD-10-CM

## 2021-01-02 DIAGNOSIS — Z4659 Encounter for fitting and adjustment of other gastrointestinal appliance and device: Secondary | ICD-10-CM

## 2021-01-02 DIAGNOSIS — E119 Type 2 diabetes mellitus without complications: Secondary | ICD-10-CM

## 2021-01-02 DIAGNOSIS — Z87891 Personal history of nicotine dependence: Secondary | ICD-10-CM

## 2021-01-02 DIAGNOSIS — Z9841 Cataract extraction status, right eye: Secondary | ICD-10-CM

## 2021-01-02 DIAGNOSIS — Z950 Presence of cardiac pacemaker: Secondary | ICD-10-CM

## 2021-01-02 DIAGNOSIS — F0280 Dementia in other diseases classified elsewhere without behavioral disturbance: Secondary | ICD-10-CM | POA: Diagnosis present

## 2021-01-02 DIAGNOSIS — R9389 Abnormal findings on diagnostic imaging of other specified body structures: Secondary | ICD-10-CM

## 2021-01-02 DIAGNOSIS — Z8249 Family history of ischemic heart disease and other diseases of the circulatory system: Secondary | ICD-10-CM

## 2021-01-02 DIAGNOSIS — I10 Essential (primary) hypertension: Secondary | ICD-10-CM | POA: Diagnosis present

## 2021-01-02 DIAGNOSIS — E1122 Type 2 diabetes mellitus with diabetic chronic kidney disease: Secondary | ICD-10-CM | POA: Diagnosis present

## 2021-01-02 DIAGNOSIS — N179 Acute kidney failure, unspecified: Secondary | ICD-10-CM

## 2021-01-02 DIAGNOSIS — E46 Unspecified protein-calorie malnutrition: Secondary | ICD-10-CM | POA: Diagnosis present

## 2021-01-02 DIAGNOSIS — R54 Age-related physical debility: Secondary | ICD-10-CM | POA: Diagnosis present

## 2021-01-02 DIAGNOSIS — Z7982 Long term (current) use of aspirin: Secondary | ICD-10-CM

## 2021-01-02 DIAGNOSIS — Z79899 Other long term (current) drug therapy: Secondary | ICD-10-CM

## 2021-01-02 DIAGNOSIS — E11649 Type 2 diabetes mellitus with hypoglycemia without coma: Secondary | ICD-10-CM | POA: Diagnosis not present

## 2021-01-02 DIAGNOSIS — Z635 Disruption of family by separation and divorce: Secondary | ICD-10-CM

## 2021-01-02 DIAGNOSIS — Z794 Long term (current) use of insulin: Secondary | ICD-10-CM

## 2021-01-02 DIAGNOSIS — I5033 Acute on chronic diastolic (congestive) heart failure: Secondary | ICD-10-CM | POA: Diagnosis not present

## 2021-01-02 DIAGNOSIS — D631 Anemia in chronic kidney disease: Secondary | ICD-10-CM | POA: Diagnosis present

## 2021-01-02 DIAGNOSIS — Z6829 Body mass index (BMI) 29.0-29.9, adult: Secondary | ICD-10-CM

## 2021-01-02 DIAGNOSIS — Z888 Allergy status to other drugs, medicaments and biological substances status: Secondary | ICD-10-CM

## 2021-01-02 DIAGNOSIS — R680 Hypothermia, not associated with low environmental temperature: Secondary | ICD-10-CM | POA: Diagnosis present

## 2021-01-02 LAB — CBC WITH DIFFERENTIAL/PLATELET
Abs Immature Granulocytes: 0.04 10*3/uL (ref 0.00–0.07)
Basophils Absolute: 0 10*3/uL (ref 0.0–0.1)
Basophils Relative: 0 %
Eosinophils Absolute: 0 10*3/uL (ref 0.0–0.5)
Eosinophils Relative: 2 %
HCT: 27.9 % — ABNORMAL LOW (ref 36.0–46.0)
Hemoglobin: 8.2 g/dL — ABNORMAL LOW (ref 12.0–15.0)
Immature Granulocytes: 2 %
Lymphocytes Relative: 14 %
Lymphs Abs: 0.3 10*3/uL — ABNORMAL LOW (ref 0.7–4.0)
MCH: 28.6 pg (ref 26.0–34.0)
MCHC: 29.4 g/dL — ABNORMAL LOW (ref 30.0–36.0)
MCV: 97.2 fL (ref 80.0–100.0)
Monocytes Absolute: 0.2 10*3/uL (ref 0.1–1.0)
Monocytes Relative: 8 %
Neutro Abs: 1.8 10*3/uL (ref 1.7–7.7)
Neutrophils Relative %: 74 %
Platelets: 80 10*3/uL — ABNORMAL LOW (ref 150–400)
RBC: 2.87 MIL/uL — ABNORMAL LOW (ref 3.87–5.11)
RDW: 15.1 % (ref 11.5–15.5)
WBC: 2.5 10*3/uL — ABNORMAL LOW (ref 4.0–10.5)
nRBC: 1.6 % — ABNORMAL HIGH (ref 0.0–0.2)

## 2021-01-02 LAB — URINALYSIS, ROUTINE W REFLEX MICROSCOPIC
Bacteria, UA: NONE SEEN
Bilirubin Urine: NEGATIVE
Glucose, UA: 50 mg/dL — AB
Hgb urine dipstick: NEGATIVE
Ketones, ur: NEGATIVE mg/dL
Leukocytes,Ua: NEGATIVE
Nitrite: NEGATIVE
Protein, ur: >=300 mg/dL — AB
Specific Gravity, Urine: 1.014 (ref 1.005–1.030)
pH: 5 (ref 5.0–8.0)

## 2021-01-02 LAB — COMPREHENSIVE METABOLIC PANEL
ALT: 93 U/L — ABNORMAL HIGH (ref 0–44)
AST: 67 U/L — ABNORMAL HIGH (ref 15–41)
Albumin: 3 g/dL — ABNORMAL LOW (ref 3.5–5.0)
Alkaline Phosphatase: 150 U/L — ABNORMAL HIGH (ref 38–126)
Anion gap: 8 (ref 5–15)
BUN: 42 mg/dL — ABNORMAL HIGH (ref 8–23)
CO2: 24 mmol/L (ref 22–32)
Calcium: 9.3 mg/dL (ref 8.9–10.3)
Chloride: 122 mmol/L — ABNORMAL HIGH (ref 98–111)
Creatinine, Ser: 2.98 mg/dL — ABNORMAL HIGH (ref 0.44–1.00)
GFR, Estimated: 16 mL/min — ABNORMAL LOW (ref >60)
Glucose, Bld: 94 mg/dL (ref 70–99)
Potassium: 4.4 mmol/L (ref 3.5–5.1)
Sodium: 154 mmol/L — ABNORMAL HIGH (ref 135–145)
Total Bilirubin: 0.4 mg/dL (ref 0.3–1.2)
Total Protein: 6.9 g/dL (ref 6.5–8.1)

## 2021-01-02 LAB — BRAIN NATRIURETIC PEPTIDE: B Natriuretic Peptide: 536.1 pg/mL — ABNORMAL HIGH (ref 0.0–100.0)

## 2021-01-02 LAB — LACTIC ACID, PLASMA: Lactic Acid, Venous: 0.6 mmol/L (ref 0.5–1.9)

## 2021-01-02 LAB — SARS CORONAVIRUS 2 BY RT PCR (HOSPITAL ORDER, PERFORMED IN ~~LOC~~ HOSPITAL LAB): SARS Coronavirus 2: POSITIVE — AB

## 2021-01-02 LAB — CBG MONITORING, ED: Glucose-Capillary: 83 mg/dL (ref 70–99)

## 2021-01-02 IMAGING — CT CT HEAD W/O CM
3 series · 15 of 47 positions shown, 18 images · non-contrast
Comparison: CT head [DATE]

CLINICAL DATA: Mental status change, unknown cause. had 2 strokes,
most recent last [REDACTED]. Pt has left sided deficits and aphasia at
baseline. Pt has weakness at baseline.

EXAM:
CT HEAD WITHOUT CONTRAST
TECHNIQUE: Contiguous axial images were obtained from the base of the skull
through the vertex without intravenous contrast.

[Series 2: head wo · axial · 0.44mm/px · z∈[-210,-80]mm · 9 of 32 slices shown, 12 images]
[im 3/32  brain]
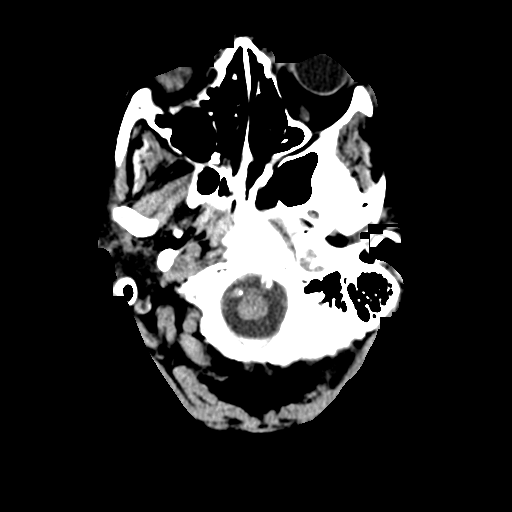
[im 3/32  bone]
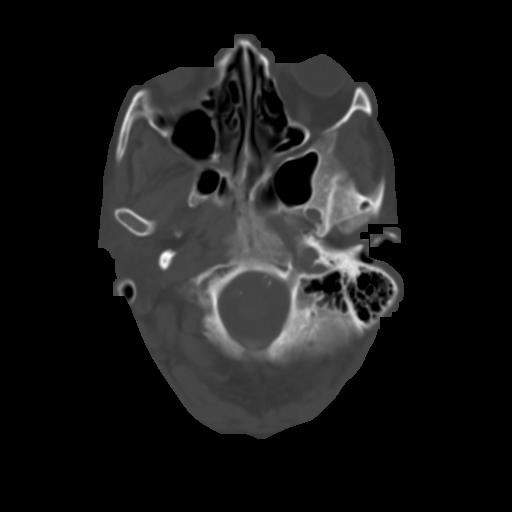
[im 6/32  brain]
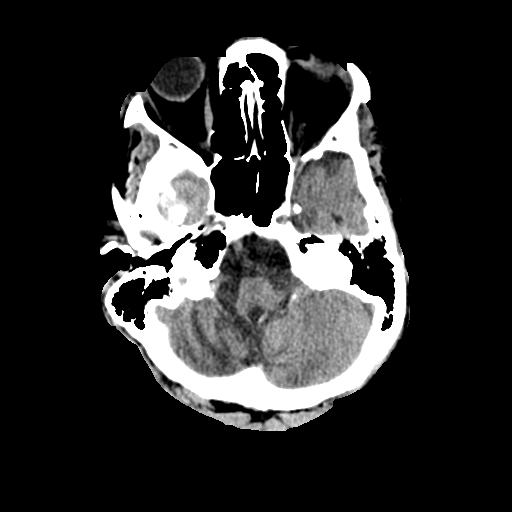
[im 9/32  brain]
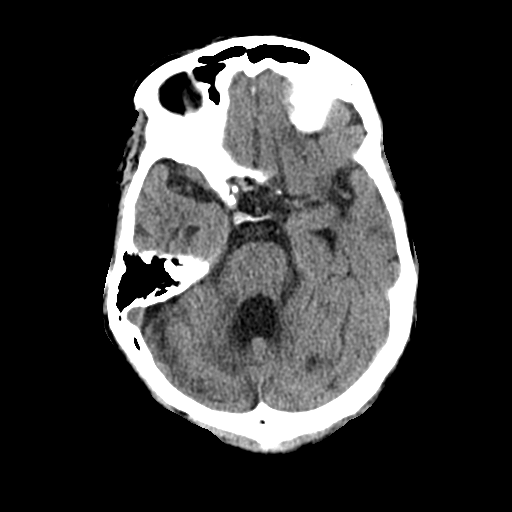
[im 12/32  brain]
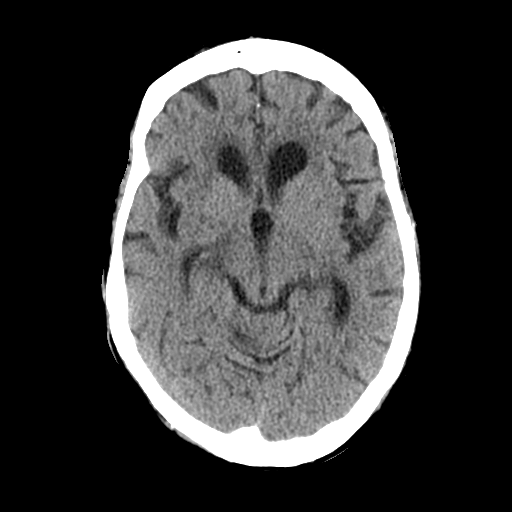
[im 17/32  brain]
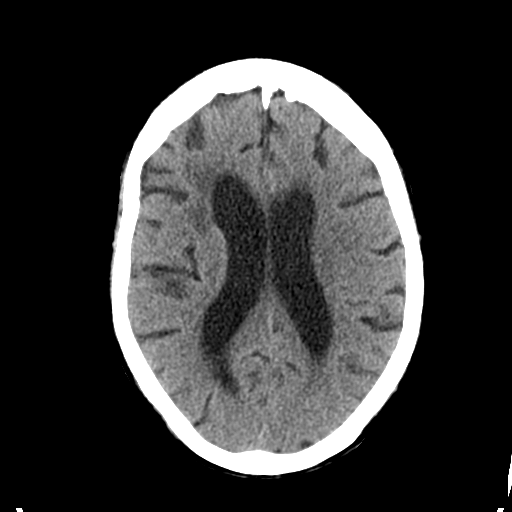
[im 17/32  bone]
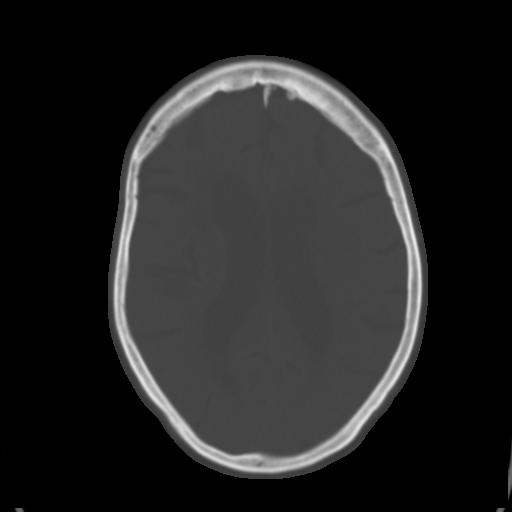
[im 20/32  brain]
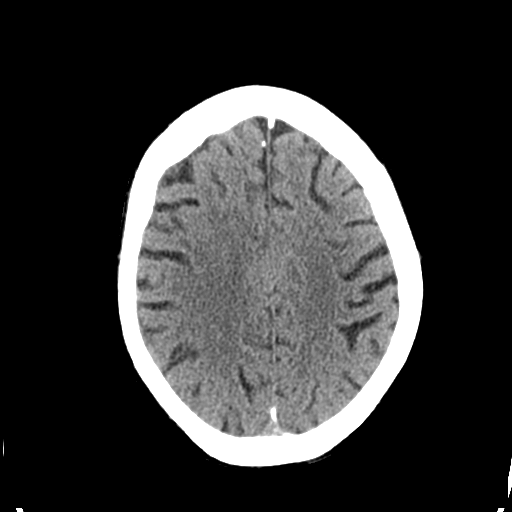
[im 23/32  brain]
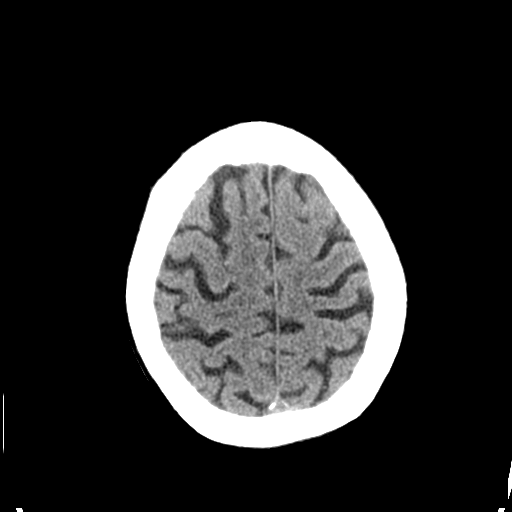
[im 26/32  brain]
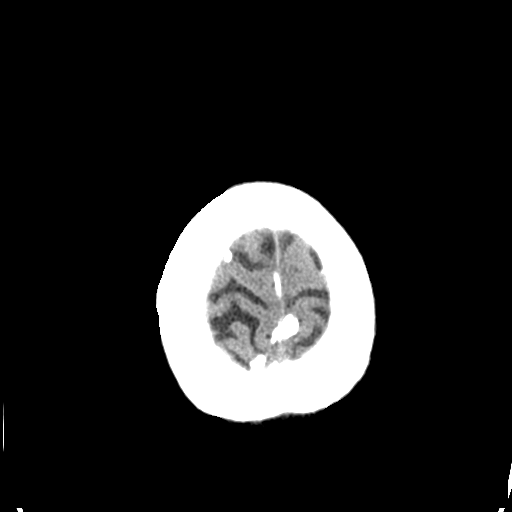
[im 29/32  brain]
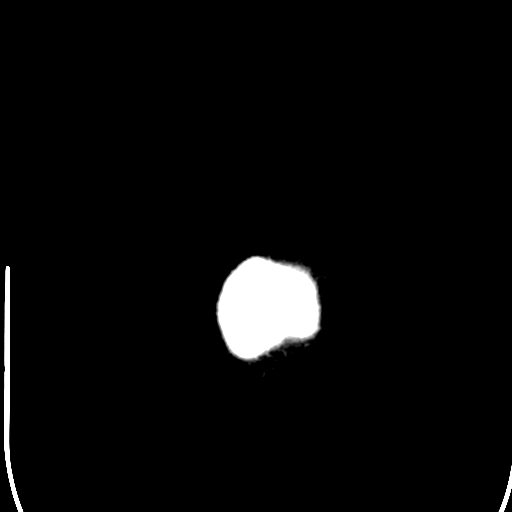
[im 29/32  bone]
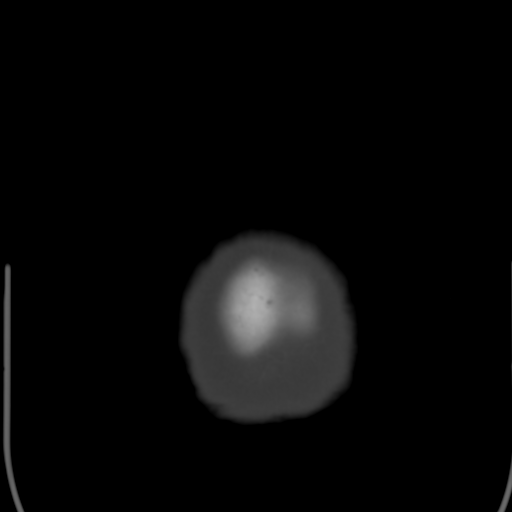

[Series 5: coronal soft tissue · coronal · 0.29mm/px · 3 of 68 slices shown]
[im 23/68  brain]
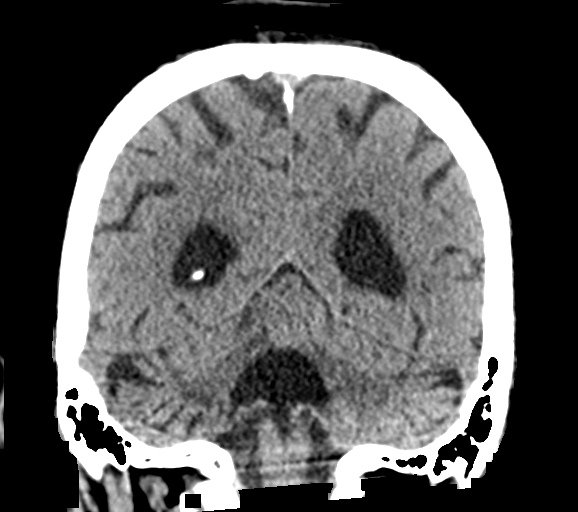
[im 30/68  brain]
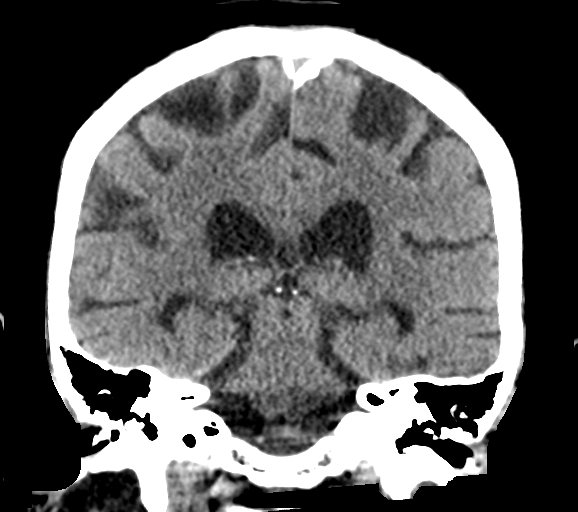
[im 38/68  brain]
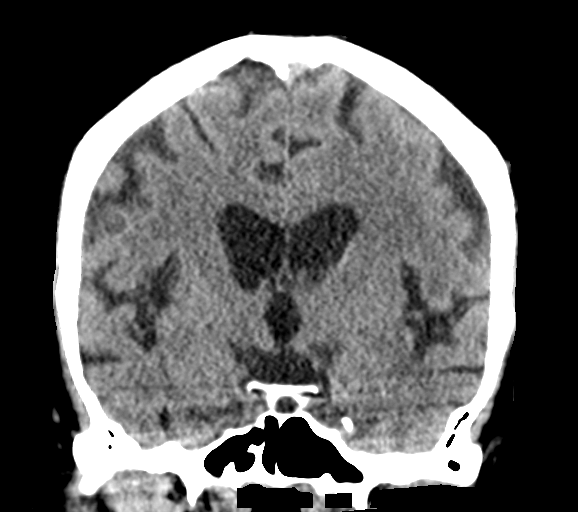

[Series 6: sagittal soft tissue · sagittal · 0.29mm/px · 3 of 55 slices shown]
[im 19/55  brain]
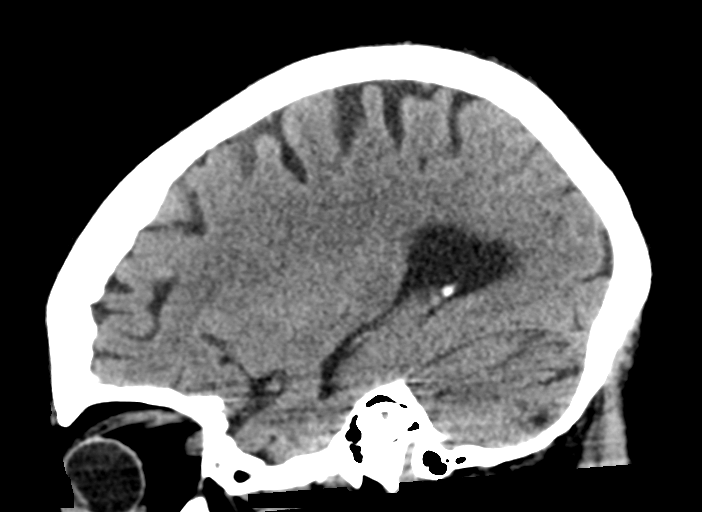
[im 28/55  brain]
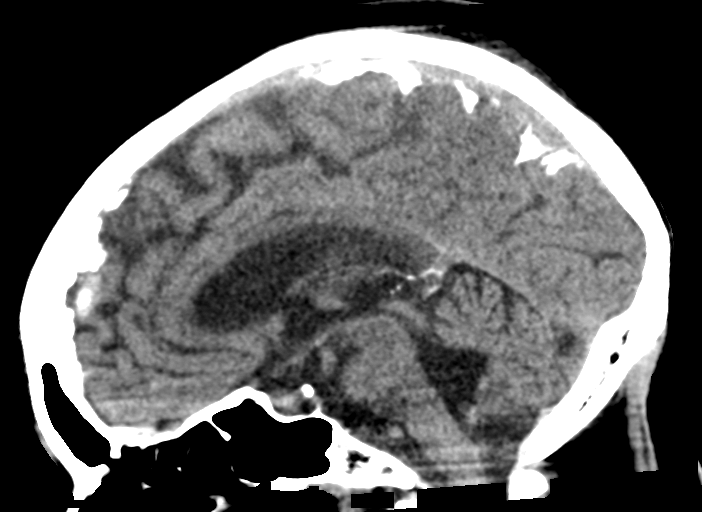
[im 37/55  brain]
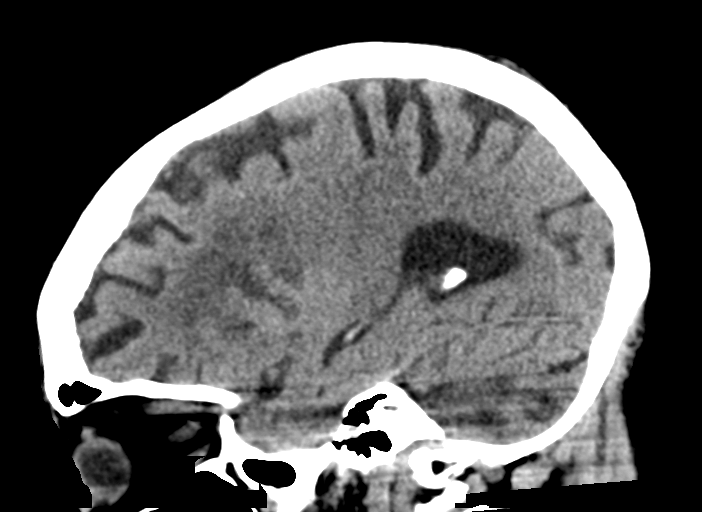

[15 of 47 positions shown; findings below may reference images not displayed]

FINDINGS: Brain:

Cerebral ventricle sizes are concordant with the degree of cerebral
volume loss.

Patchy and confluent areas of decreased attenuation are noted
throughout the deep and periventricular white matter of the cerebral
hemispheres bilaterally, compatible with chronic microvascular
ischemic disease.

Bilateral, right greater than left cerebellar encephalomalacia.
Prior right basal ganglia lacunar infarction. No evidence of
large-territorial acute infarction. No parenchymal hemorrhage. No
mass lesion. No extra-axial collection.

No mass effect or midline shift. No hydrocephalus. Basilar cisterns
are patent.

Vascular: No hyperdense vessel. Atherosclerotic calcifications are
present within the cavernous internal carotid arteries.

Skull: No acute fracture or focal lesion.

Sinuses/Orbits: Paranasal sinuses and mastoid air cells are clear.
The orbits are unremarkable.

Other: None.
IMPRESSION: No acute intracranial abnormality.

## 2021-01-02 IMAGING — DX DG CHEST 1V PORT
1 series · 1 of 1 positions shown · non-contrast
Comparison: [DATE]

CLINICAL DATA: Change in mental status

EXAM:
PORTABLE CHEST 1 VIEW

[chest ap]
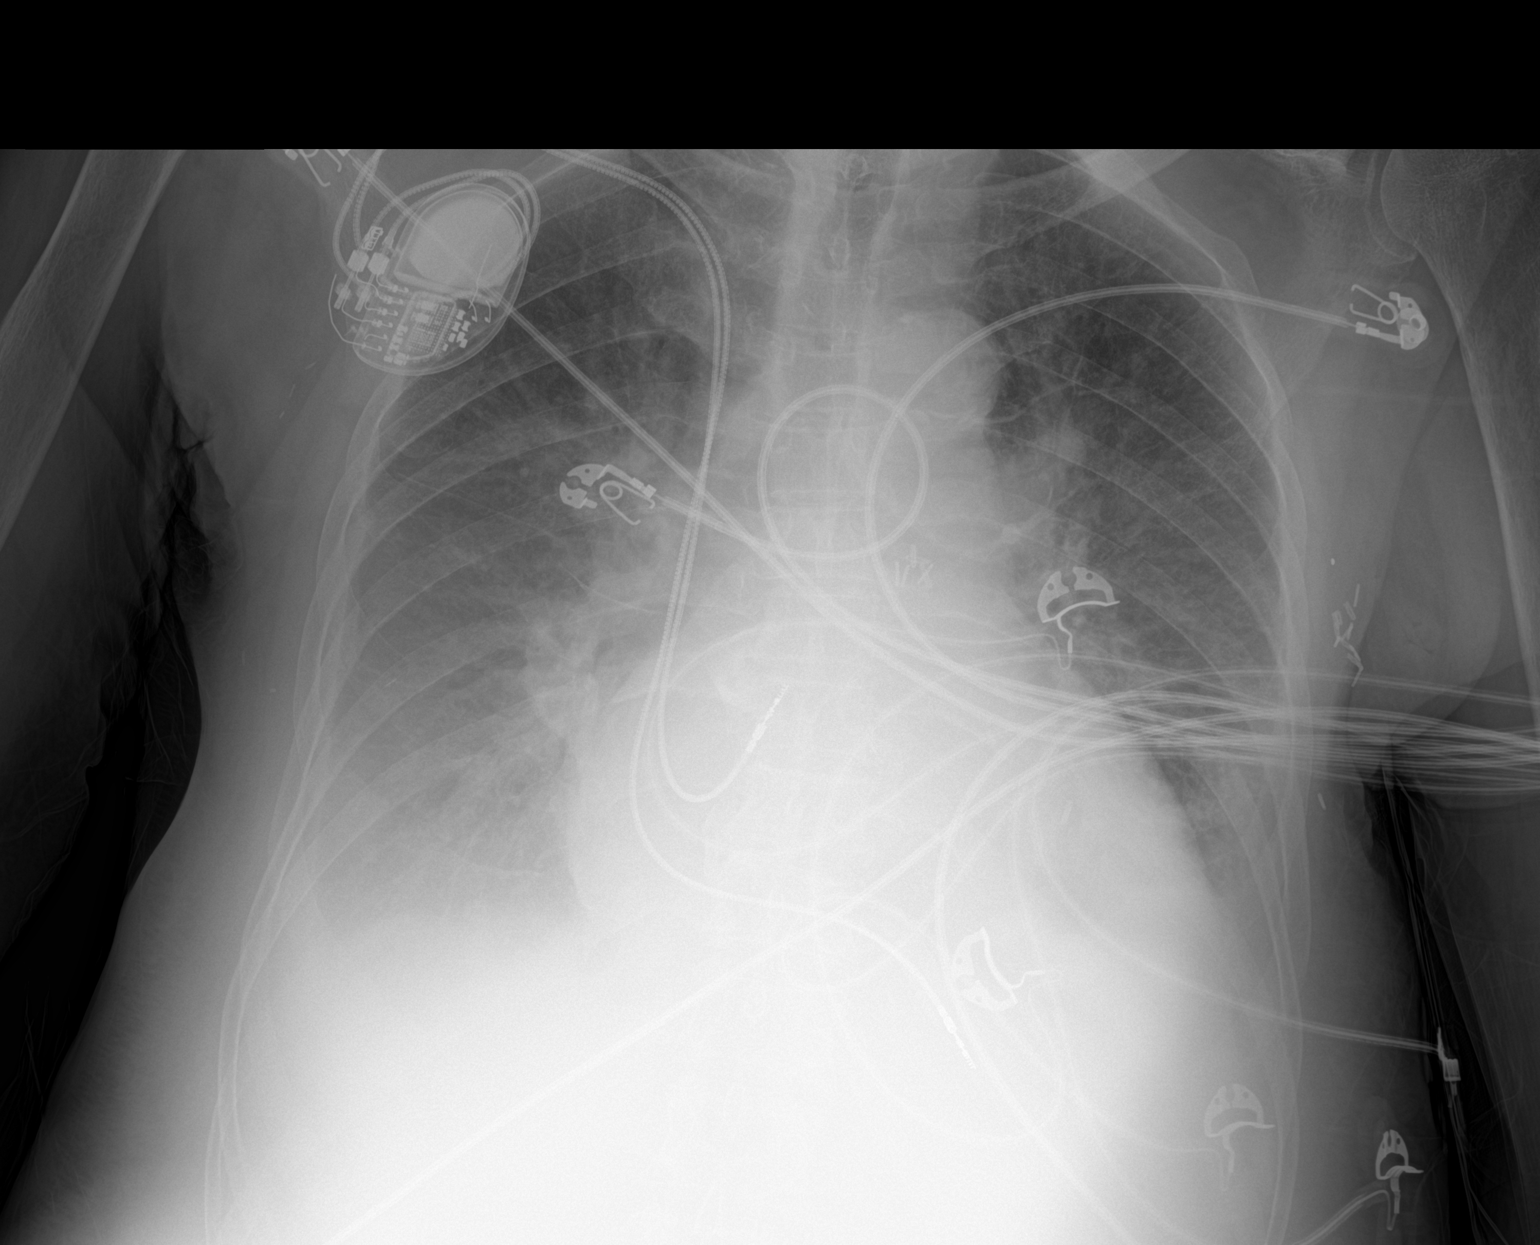

[1 of 1 positions shown; findings below may reference images not displayed]

FINDINGS: The heart size and mediastinal contours are mildly enlarged. There
is hazy interstitial opacities seen throughout both lungs. Small
bilateral pleural effusions are seen. The visualized skeletal
structures are unremarkable.
IMPRESSION: Diffuse interstitial opacities which may be due to interstitial
edema and/or infectious etiology

Small bilateral pleural effusions

## 2021-01-02 MED ORDER — PANTOPRAZOLE SODIUM 40 MG PO TBEC
40.0000 mg | DELAYED_RELEASE_TABLET | Freq: Every day | ORAL | Status: DC
  Administered 2021-01-08: 40 mg via ORAL
  Filled 2021-01-02 (×2): qty 1

## 2021-01-02 MED ORDER — CLOPIDOGREL BISULFATE 75 MG PO TABS
75.0000 mg | ORAL_TABLET | Freq: Every day | ORAL | Status: DC
  Administered 2021-01-08: 75 mg via ORAL
  Filled 2021-01-02 (×3): qty 1

## 2021-01-02 MED ORDER — DORZOLAMIDE HCL-TIMOLOL MAL 2-0.5 % OP SOLN
1.0000 [drp] | Freq: Two times a day (BID) | OPHTHALMIC | Status: DC
  Administered 2021-01-03 – 2021-02-19 (×95): 1 [drp] via OPHTHALMIC
  Filled 2021-01-02 (×3): qty 10

## 2021-01-02 MED ORDER — CARVEDILOL 12.5 MG PO TABS
25.0000 mg | ORAL_TABLET | Freq: Two times a day (BID) | ORAL | Status: DC
  Administered 2021-01-08: 25 mg via ORAL
  Filled 2021-01-02 (×2): qty 2

## 2021-01-02 MED ORDER — HYDRALAZINE HCL 25 MG PO TABS
25.0000 mg | ORAL_TABLET | Freq: Two times a day (BID) | ORAL | Status: DC
  Administered 2021-01-07 – 2021-01-08 (×2): 25 mg via ORAL
  Filled 2021-01-02 (×4): qty 1

## 2021-01-02 MED ORDER — HEPARIN SODIUM (PORCINE) 5000 UNIT/ML IJ SOLN: 5000.0000 [IU] | Freq: Three times a day (TID) | INTRAMUSCULAR | Status: DC

## 2021-01-02 MED ORDER — SODIUM CHLORIDE 0.9 % IV SOLN
200.0000 mg | Freq: Once | INTRAVENOUS | Status: AC
  Administered 2021-01-03: 200 mg via INTRAVENOUS
  Filled 2021-01-02: qty 200

## 2021-01-02 MED ORDER — FERROUS SULFATE 325 (65 FE) MG PO TABS: 325.0000 mg | ORAL_TABLET | ORAL | Status: DC

## 2021-01-02 MED ORDER — ATORVASTATIN CALCIUM 40 MG PO TABS
80.0000 mg | ORAL_TABLET | Freq: Every day | ORAL | Status: DC
  Administered 2021-01-08: 80 mg via ORAL
  Filled 2021-01-02 (×2): qty 2

## 2021-01-02 MED ORDER — SODIUM CHLORIDE 0.9 % IV BOLUS
500.0000 mL | Freq: Once | INTRAVENOUS | Status: AC
  Administered 2021-01-03: 500 mL via INTRAVENOUS

## 2021-01-02 MED ORDER — LACTATED RINGERS IV SOLN: INTRAVENOUS | Status: DC

## 2021-01-02 MED ORDER — ONDANSETRON HCL 4 MG PO TABS: 4.0000 mg | ORAL_TABLET | Freq: Four times a day (QID) | ORAL | Status: DC | PRN

## 2021-01-02 MED ORDER — SODIUM CHLORIDE 0.9 % IV SOLN
100.0000 mg | Freq: Every day | INTRAVENOUS | Status: DC
  Administered 2021-01-03 – 2021-01-04 (×2): 100 mg via INTRAVENOUS
  Filled 2021-01-02 (×2): qty 20

## 2021-01-02 MED ORDER — ONDANSETRON HCL 4 MG/2ML IJ SOLN: 4.0000 mg | Freq: Four times a day (QID) | INTRAMUSCULAR | Status: DC | PRN

## 2021-01-02 MED ORDER — ACETAMINOPHEN 325 MG PO TABS
650.0000 mg | ORAL_TABLET | Freq: Four times a day (QID) | ORAL | Status: DC | PRN
  Filled 2021-01-02: qty 2

## 2021-01-02 MED ORDER — INSULIN ASPART 100 UNIT/ML ~~LOC~~ SOLN
0.0000 [IU] | Freq: Three times a day (TID) | SUBCUTANEOUS | Status: DC
  Filled 2021-01-02: qty 0.09

## 2021-01-02 NOTE — ED Triage Notes (Signed)
Pt BIB EMS from [facility]. Pt has had 2 strokes, most recent last October. 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 Pt has weakness at baseline. Pt sister was visiting pt last Monday 1/17 and noticed pt was making gargling sound. EMS reports pt is making gargling sound. Facility reports pt making gargling sound, pt is tachycardic. EMS reports pt was 99%-100% RA and HR 100. Pt sister reports pt has dementia. EMS reports pt has clear lung sounds.   183/93 CBG 111

## 2021-01-02 NOTE — ED Provider Notes (Signed)
[facility] DEPT Provider Note   CSN: [id number] Arrival date & time: 01/02/21  4580  LEVEL 5 CAVEAT - ALTERED MENTAL STATUS   History Chief Complaint  Patient presents with  . Aphasia    Desiree Pollard is a 75 y.o. female.  HPI 74 year old female presents with altered mental status.  History is from sister over the phone primarily.  The patient has been altered for about a week along with slurred speech that is new.  She did have aphasia with her stroke but then her speech seemed to return to normal per the sister.  Is able to ambulate with a walker but no longer can.  Seems more somnolent and is hallucinating.  No obvious fevers or vomiting.  Past Medical History:  Diagnosis Date  . Breast cancer ([place])    2017  . CVA (cerebral vascular accident) ([place]) 11/2018  . Dehydration 07/2020  . Diabetes mellitus without complication ([place])   . Diabetic retinopathy ([place])    PDR OU  . Frequent diarrhea 07/2020  . Hyperkalemia 07/2020  . Hypertensive retinopathy    OU  . Stroke [facility])     Patient Active Problem List   Diagnosis Date Noted  . COVID-19 virus infection 01/02/2021  . Hypernatremia 01/02/2021  . 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 CKD (chronic kidney disease), stage IV ([place]) 11/26/2020  . Acute metabolic encephalopathy 99/83/3825  . Generalized weakness 11/24/2020  . Acute lower UTI 11/24/2020  . Hypothermia 11/24/2020  . ARF (acute renal failure) ([place]) 10/19/2020  . Dizziness 10/19/2020  . Thrombocytopenia ([place]) 10/19/2020  . Chronic diarrhea 09/18/2020  . Loss of weight 09/18/2020  . Dysphagia 09/18/2020  . 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 Hemoglobin A1c less than 7.0% 06/16/2020  . Hyperglycemia 06/16/2020  . Hypertension 06/16/2020  . History of stroke 06/16/2020    Past Surgical History:  Procedure Laterality Date  . CATARACT EXTRACTION Right   . MASTECTOMY Bilateral    "2017"  Left Mastectomy; "2019" Right Mastectomy  . PACEMAKER IMPLANT  2017     OB History   No obstetric history on file.     Family History  Problem Relation Age of Onset  . Ovarian cancer Mother   . Heart disease Father   . Diabetes Sister   . Diabetes Brother   . Colon cancer Neg Hx   . Esophageal cancer Neg Hx   . Pancreatic cancer Neg Hx   . Stomach cancer Neg Hx   . Liver disease Neg Hx     Social History   Tobacco Use  . Smoking status: Former Research scientist (life sciences)  . Smokeless tobacco: Never Used  Vaping Use  . Vaping Use: Never used  Substance Use Topics  . Alcohol use: Not Currently  . Drug use: Never    Home Medications Prior to Admission medications   Medication Sig Start Date End Date Taking? Authorizing Provider  atorvastatin (LIPITOR) 80 MG tablet Take 80 mg by mouth daily.  10/13/18  Yes [provider]  carvedilol (COREG) 25 MG tablet Take 25 mg by mouth 2 (two) times daily with a meal.  10/13/18  Yes [provider]  clopidogrel (PLAVIX) 75 MG tablet Take 1 tablet (75 mg total) by mouth daily. 10/24/20  Yes Ghimire, Henreitta Leber, MD  dorzolamide-timolol (COSOPT) 22.3-6.8 MG/ML ophthalmic solution INSTILL 1 DROP INTO BOTH EYES TWICE A DAY Patient taking differently: Place 1 drop into both eyes 2 (two) times daily.  01/14/20  Yes Bernarda Caffey, MD  ferrous sulfate 325 (65 FE) MG tablet Take 325 mg by mouth every other day.   Yes [provider]  furosemide (LASIX) 40 MG tablet Take 40 mg by mouth daily.   Yes [provider]  hydrALAZINE (APRESOLINE) 25 MG tablet Take 1 tablet (25 mg total) by mouth See admin instructions. Take 50 mg by mouth TID. Patient taking differently: Take 25 mg by mouth in the morning and at bedtime. 11/26/20  Yes Samuella Cota, MD  insulin lispro (HUMALOG) 100 UNIT/ML injection Inject 0-6 Units into the skin 3 (three) times daily. Sliding scale :  70-150= 0 units 151-200= 1 unit 201-250=  2 units 251-300= 3  units 301-350= 4 units 351-400= 5 units Over 400 = 6 units and call MD   Yes [provider]  loperamide (IMODIUM A-D) 2 MG tablet Take 1 tablet (2 mg total) by mouth 4 (four) times daily as needed for diarrhea or loose stools. 08/09/20  Yes [name], FNP  pantoprazole (PROTONIX) 40 MG tablet Take 1 tablet (40 mg total) by mouth daily. 10/24/20  Yes Ghimire, Henreitta Leber, MD  Accu-Chek FastClix Lancets MISC 1 each by Other route as directed. To test blood glucose daily. 05/24/19   [provider]  Blood Glucose Monitoring Suppl (FIFTY50 GLUCOSE METER 2.0) w/Device KIT 1 each by Other route See admin instructions. Use to check blood sugars 08/31/18   [provider]  Insulin Pen Needle (FIFTY50 PEN NEEDLES) 32G X 4 MM MISC 1 each by Other route See admin instructions. Use as instructed. 11/17/16   [provider]    Allergies    Amlodipine  Review of Systems   Review of Systems  Unable to perform ROS: Mental status change    Physical Exam Updated Vital Signs BP (!) 167/88   Pulse 60   Temp 99 F (37.2 C) (Rectal)   Resp 16   Ht 5' 7"  (1.702 m)   Wt 68 kg   SpO2 99%   BMI 23.49 kg/m   Physical Exam Vitals and nursing note reviewed.  Constitutional:      Appearance: She is well-developed and well-nourished.  HENT:     Head: Normocephalic and atraumatic.     Right Ear: External ear normal.     Left Ear: External ear normal.     Nose: Nose normal.  Eyes:     General:        Right eye: No discharge.        Left eye: No discharge.  Cardiovascular:     Rate and Rhythm: Normal rate and regular rhythm.     Heart sounds: Normal heart sounds.  Pulmonary:     Effort: Pulmonary effort is normal.     Breath sounds: Normal breath sounds.  Abdominal:     Palpations: Abdomen is soft.     Tenderness: There is no abdominal tenderness.  Skin:    General: Skin is warm and dry.  Neurological:     Mental Status: She is alert.     Comments:  Awake, alert. Equal strength in all 4 extremities. Slurred/incomprehensible speech  Psychiatric:        Mood and Affect: Mood is not anxious.     ED Results / Procedures / Treatments   Labs (all labs ordered are listed, but only abnormal results are displayed) Labs Reviewed  SARS CORONAVIRUS 2 BY RT PCR ([place]) -  Abnormal; Notable for the following components:      Result Value   SARS Coronavirus 2 POSITIVE (*)    All other components within normal limits  COMPREHENSIVE METABOLIC PANEL - Abnormal; Notable for the following components:   Sodium 154 (*)    Chloride 122 (*)    BUN 42 (*)    Creatinine, Ser 2.98 (*)    Albumin 3.0 (*)    AST 67 (*)    ALT 93 (*)    Alkaline Phosphatase 150 (*)    GFR, Estimated 16 (*)    All other components within normal limits  CBC WITH DIFFERENTIAL/PLATELET - Abnormal; Notable for the following components:   WBC 2.5 (*)    RBC 2.87 (*)    Hemoglobin 8.2 (*)    HCT 27.9 (*)    MCHC 29.4 (*)    Platelets 80 (*)    nRBC 1.6 (*)    Lymphs Abs 0.3 (*)    All other components within normal limits  URINALYSIS, ROUTINE W REFLEX MICROSCOPIC - Abnormal; Notable for the following components:   Glucose, UA 50 (*)    Protein, ur >=300 (*)    All other components within normal limits  BRAIN NATRIURETIC PEPTIDE - Abnormal; Notable for the following components:   B Natriuretic Peptide 536.1 (*)    All other components within normal limits  URINE CULTURE  LACTIC ACID, PLASMA  CBC WITH DIFFERENTIAL/PLATELET  COMPREHENSIVE METABOLIC PANEL  C-REACTIVE PROTEIN  D-DIMER, QUANTITATIVE (NOT AT [facility])  D-DIMER, QUANTITATIVE (NOT AT [facility])  PROCALCITONIN  C-REACTIVE PROTEIN  HEMOGLOBIN A1C  CBG MONITORING, ED    EKG EKG Interpretation  Date/Time:  Thursday January 02 2021 20:00:20 EST Ventricular Rate:  60 PR Interval:    QRS Duration: 188 QT Interval:  550 QTC Calculation: 550 R Axis:   -56 Text  Interpretation: Sinus rhythm Probable left atrial enlargement Left bundle branch block similar to Dec 2021 Confirmed by Sherwood Gambler [phone]) on 01/02/2021 8:03:47 PM   Radiology CT Head Wo Contrast  Result Date: 01/02/2021 CLINICAL DATA:  Mental status change, unknown cause. had 2 strokes, most recent last October. 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 Pt has weakness at baseline. EXAM: CT HEAD WITHOUT CONTRAST TECHNIQUE: Contiguous axial images were obtained from the base of the skull through the vertex without intravenous contrast. COMPARISON:  CT head 11/24/2020 FINDINGS: Brain: Cerebral ventricle sizes are concordant with the degree of cerebral volume loss. Patchy and confluent areas of decreased attenuation are noted throughout the deep and periventricular white matter of the cerebral hemispheres bilaterally, compatible with chronic microvascular ischemic disease. Bilateral, right greater than left cerebellar encephalomalacia. Prior right basal ganglia lacunar infarction. No evidence of large-territorial acute infarction. No parenchymal hemorrhage. No mass lesion. No extra-axial collection. No mass effect or midline shift. No hydrocephalus. Basilar cisterns are patent. Vascular: No hyperdense vessel. Atherosclerotic calcifications are present within the cavernous internal carotid arteries. Skull: No acute fracture or focal lesion. Sinuses/Orbits: Paranasal sinuses and mastoid air cells are clear. The orbits are unremarkable. Other: None. IMPRESSION: No acute intracranial abnormality. Electronically Signed   By: Iven Finn M.D.   On: 01/02/2021 20:46   DG Chest Portable 1 View  Result Date: 01/02/2021 CLINICAL DATA:  Change in mental status EXAM: PORTABLE CHEST 1 VIEW COMPARISON:  November 24, 2020 FINDINGS: The heart size and mediastinal contours are mildly enlarged. There is hazy interstitial opacities seen throughout both lungs. Small bilateral pleural effusions are seen. The  visualized skeletal structures are unremarkable. IMPRESSION: Diffuse interstitial opacities which may be due to interstitial edema and/or infectious etiology Small bilateral pleural effusions Electronically Signed   By: Prudencio Pair M.D.   On: 01/02/2021 20:26    Procedures Procedures (including critical care time)  Medications Ordered in ED Medications  sodium chloride 0.9 % bolus 500 mL (has no administration in time range)  lactated ringers infusion (has no administration in time range)  remdesivir 200 mg in sodium chloride 0.9% 250 mL IVPB (has no administration in time range)    Followed by  remdesivir 100 mg in sodium chloride 0.9 % 100 mL IVPB (has no administration in time range)  acetaminophen (TYLENOL) tablet 650 mg (has no administration in time range)  ondansetron (ZOFRAN) tablet 4 mg (has no administration in time range)    Or  ondansetron (ZOFRAN) injection 4 mg (has no administration in time range)  clopidogrel (PLAVIX) tablet 75 mg (has no administration in time range)  atorvastatin (LIPITOR) tablet 80 mg (has no administration in time range)  carvedilol (COREG) tablet 25 mg (has no administration in time range)  ferrous sulfate tablet 325 mg (has no administration in time range)  dorzolamide-timolol (COSOPT) 22.3-6.8 MG/ML ophthalmic solution 1 drop (has no administration in time range)  hydrALAZINE (APRESOLINE) tablet 25 mg (has no administration in time range)  pantoprazole (PROTONIX) EC tablet 40 mg (has no administration in time range)  insulin aspart (novoLOG) injection 0-9 Units (has no administration in time range)    ED Course  I have reviewed the triage vital signs and the nursing notes.  Pertinent labs & imaging results that were available during my care of the patient were reviewed by me and considered in my medical decision making (see chart for details).    MDM Rules/Calculators/A&P                          Patient is altered from baseline.   Presumably dehydration is playing a role given increase in her creatinine as well as hypernatremia and elevated BUN.  Will give small bolus of fluids.  I think her chest x-ray findings are more likely from COVID rather than CHF.  We will need admission  Desiree Pollard was evaluated in Emergency Department on 01/02/2021 for the symptoms described in the history of present illness. She was evaluated in the context of the global COVID-19 pandemic, which necessitated consideration that the patient might be at risk for infection with the SARS-CoV-2 virus that causes COVID-19. Institutional protocols and algorithms that pertain to the evaluation of patients at risk for COVID-19 are in a state of rapid change based on information released by regulatory bodies including the CDC and federal and state organizations. These policies and algorithms were followed during the patient's care in the ED.  Final Clinical Impression(s) / ED Diagnoses Final diagnoses:  COVID-19 virus infection  Hypernatremia    Rx / DC Orders ED Discharge Orders    None       Sherwood Gambler, MD 01/02/21 2313

## 2021-01-03 DIAGNOSIS — N179 Acute kidney failure, unspecified: Secondary | ICD-10-CM | POA: Diagnosis present

## 2021-01-03 DIAGNOSIS — U071 COVID-19: Secondary | ICD-10-CM | POA: Diagnosis present

## 2021-01-03 DIAGNOSIS — Z66 Do not resuscitate: Secondary | ICD-10-CM | POA: Diagnosis not present

## 2021-01-03 DIAGNOSIS — I5033 Acute on chronic diastolic (congestive) heart failure: Secondary | ICD-10-CM | POA: Diagnosis not present

## 2021-01-03 DIAGNOSIS — R338 Other retention of urine: Secondary | ICD-10-CM | POA: Diagnosis not present

## 2021-01-03 DIAGNOSIS — I69320 Aphasia following cerebral infarction: Secondary | ICD-10-CM | POA: Diagnosis not present

## 2021-01-03 DIAGNOSIS — Z8673 Personal history of transient ischemic attack (TIA), and cerebral infarction without residual deficits: Secondary | ICD-10-CM | POA: Diagnosis not present

## 2021-01-03 DIAGNOSIS — F0280 Dementia in other diseases classified elsewhere without behavioral disturbance: Secondary | ICD-10-CM | POA: Diagnosis present

## 2021-01-03 DIAGNOSIS — E872 Acidosis: Secondary | ICD-10-CM | POA: Diagnosis present

## 2021-01-03 DIAGNOSIS — I361 Nonrheumatic tricuspid (valve) insufficiency: Secondary | ICD-10-CM | POA: Diagnosis not present

## 2021-01-03 DIAGNOSIS — Z794 Long term (current) use of insulin: Secondary | ICD-10-CM | POA: Diagnosis not present

## 2021-01-03 DIAGNOSIS — I69354 Hemiplegia and hemiparesis following cerebral infarction affecting left non-dominant side: Secondary | ICD-10-CM | POA: Diagnosis not present

## 2021-01-03 DIAGNOSIS — E1122 Type 2 diabetes mellitus with diabetic chronic kidney disease: Secondary | ICD-10-CM | POA: Diagnosis present

## 2021-01-03 DIAGNOSIS — D61818 Other pancytopenia: Secondary | ICD-10-CM | POA: Diagnosis present

## 2021-01-03 DIAGNOSIS — J9811 Atelectasis: Secondary | ICD-10-CM | POA: Diagnosis present

## 2021-01-03 DIAGNOSIS — E860 Dehydration: Secondary | ICD-10-CM | POA: Diagnosis present

## 2021-01-03 DIAGNOSIS — E46 Unspecified protein-calorie malnutrition: Secondary | ICD-10-CM | POA: Diagnosis present

## 2021-01-03 DIAGNOSIS — G9341 Metabolic encephalopathy: Secondary | ICD-10-CM | POA: Diagnosis present

## 2021-01-03 DIAGNOSIS — R9389 Abnormal findings on diagnostic imaging of other specified body structures: Secondary | ICD-10-CM | POA: Diagnosis not present

## 2021-01-03 DIAGNOSIS — R4701 Aphasia: Secondary | ICD-10-CM | POA: Diagnosis not present

## 2021-01-03 DIAGNOSIS — I5032 Chronic diastolic (congestive) heart failure: Secondary | ICD-10-CM | POA: Diagnosis not present

## 2021-01-03 DIAGNOSIS — E119 Type 2 diabetes mellitus without complications: Secondary | ICD-10-CM | POA: Diagnosis not present

## 2021-01-03 DIAGNOSIS — E870 Hyperosmolality and hypernatremia: Secondary | ICD-10-CM | POA: Diagnosis present

## 2021-01-03 DIAGNOSIS — I10 Essential (primary) hypertension: Secondary | ICD-10-CM

## 2021-01-03 DIAGNOSIS — R220 Localized swelling, mass and lump, head: Secondary | ICD-10-CM | POA: Diagnosis not present

## 2021-01-03 DIAGNOSIS — M7989 Other specified soft tissue disorders: Secondary | ICD-10-CM | POA: Diagnosis not present

## 2021-01-03 DIAGNOSIS — D72819 Decreased white blood cell count, unspecified: Secondary | ICD-10-CM | POA: Diagnosis present

## 2021-01-03 DIAGNOSIS — R7989 Other specified abnormal findings of blood chemistry: Secondary | ICD-10-CM | POA: Diagnosis present

## 2021-01-03 DIAGNOSIS — D631 Anemia in chronic kidney disease: Secondary | ICD-10-CM | POA: Diagnosis present

## 2021-01-03 DIAGNOSIS — I130 Hypertensive heart and chronic kidney disease with heart failure and stage 1 through stage 4 chronic kidney disease, or unspecified chronic kidney disease: Secondary | ICD-10-CM | POA: Diagnosis present

## 2021-01-03 DIAGNOSIS — R1312 Dysphagia, oropharyngeal phase: Secondary | ICD-10-CM | POA: Diagnosis present

## 2021-01-03 DIAGNOSIS — N184 Chronic kidney disease, stage 4 (severe): Secondary | ICD-10-CM | POA: Diagnosis present

## 2021-01-03 DIAGNOSIS — I340 Nonrheumatic mitral (valve) insufficiency: Secondary | ICD-10-CM | POA: Diagnosis not present

## 2021-01-03 DIAGNOSIS — Z515 Encounter for palliative care: Secondary | ICD-10-CM | POA: Diagnosis not present

## 2021-01-03 DIAGNOSIS — R1310 Dysphagia, unspecified: Secondary | ICD-10-CM | POA: Diagnosis not present

## 2021-01-03 DIAGNOSIS — R945 Abnormal results of liver function studies: Secondary | ICD-10-CM | POA: Diagnosis not present

## 2021-01-03 DIAGNOSIS — J9601 Acute respiratory failure with hypoxia: Secondary | ICD-10-CM | POA: Diagnosis not present

## 2021-01-03 LAB — BASIC METABOLIC PANEL
Anion gap: 10 (ref 5–15)
BUN: 43 mg/dL — ABNORMAL HIGH (ref 8–23)
CO2: 22 mmol/L (ref 22–32)
Calcium: 9 mg/dL (ref 8.9–10.3)
Chloride: 120 mmol/L — ABNORMAL HIGH (ref 98–111)
Creatinine, Ser: 2.8 mg/dL — ABNORMAL HIGH (ref 0.44–1.00)
GFR, Estimated: 17 mL/min — ABNORMAL LOW (ref >60)
Glucose, Bld: 88 mg/dL (ref 70–99)
Potassium: 4.1 mmol/L (ref 3.5–5.1)
Sodium: 152 mmol/L — ABNORMAL HIGH (ref 135–145)

## 2021-01-03 LAB — CBC WITH DIFFERENTIAL/PLATELET
Abs Immature Granulocytes: 0.03 10*3/uL (ref 0.00–0.07)
Basophils Absolute: 0 10*3/uL (ref 0.0–0.1)
Basophils Relative: 1 %
Eosinophils Absolute: 0.1 10*3/uL (ref 0.0–0.5)
Eosinophils Relative: 2 %
HCT: 27.3 % — ABNORMAL LOW (ref 36.0–46.0)
Hemoglobin: 7.9 g/dL — ABNORMAL LOW (ref 12.0–15.0)
Immature Granulocytes: 1 %
Lymphocytes Relative: 12 %
Lymphs Abs: 0.3 10*3/uL — ABNORMAL LOW (ref 0.7–4.0)
MCH: 28.2 pg (ref 26.0–34.0)
MCHC: 28.9 g/dL — ABNORMAL LOW (ref 30.0–36.0)
MCV: 97.5 fL (ref 80.0–100.0)
Monocytes Absolute: 0.2 10*3/uL (ref 0.1–1.0)
Monocytes Relative: 6 %
Neutro Abs: 2.2 10*3/uL (ref 1.7–7.7)
Neutrophils Relative %: 78 %
Platelets: 71 10*3/uL — ABNORMAL LOW (ref 150–400)
RBC: 2.8 MIL/uL — ABNORMAL LOW (ref 3.87–5.11)
RDW: 15.3 % (ref 11.5–15.5)
WBC: 2.8 10*3/uL — ABNORMAL LOW (ref 4.0–10.5)
nRBC: 1.4 % — ABNORMAL HIGH (ref 0.0–0.2)

## 2021-01-03 LAB — COMPREHENSIVE METABOLIC PANEL
ALT: 86 U/L — ABNORMAL HIGH (ref 0–44)
AST: 58 U/L — ABNORMAL HIGH (ref 15–41)
Albumin: 2.8 g/dL — ABNORMAL LOW (ref 3.5–5.0)
Alkaline Phosphatase: 140 U/L — ABNORMAL HIGH (ref 38–126)
Anion gap: 9 (ref 5–15)
BUN: 46 mg/dL — ABNORMAL HIGH (ref 8–23)
CO2: 23 mmol/L (ref 22–32)
Calcium: 9 mg/dL (ref 8.9–10.3)
Chloride: 121 mmol/L — ABNORMAL HIGH (ref 98–111)
Creatinine, Ser: 2.95 mg/dL — ABNORMAL HIGH (ref 0.44–1.00)
GFR, Estimated: 16 mL/min — ABNORMAL LOW (ref >60)
Glucose, Bld: 95 mg/dL (ref 70–99)
Potassium: 4.3 mmol/L (ref 3.5–5.1)
Sodium: 153 mmol/L — ABNORMAL HIGH (ref 135–145)
Total Bilirubin: 0.5 mg/dL (ref 0.3–1.2)
Total Protein: 6.6 g/dL (ref 6.5–8.1)

## 2021-01-03 LAB — HEMOGLOBIN A1C
Hgb A1c MFr Bld: 5.7 % — ABNORMAL HIGH (ref 4.8–5.6)
Mean Plasma Glucose: 116.89 mg/dL

## 2021-01-03 LAB — CBG MONITORING, ED
Glucose-Capillary: 82 mg/dL (ref 70–99)
Glucose-Capillary: 82 mg/dL (ref 70–99)
Glucose-Capillary: 60 mg/dL — ABNORMAL LOW (ref 70–99)
Glucose-Capillary: 99 mg/dL (ref 70–99)
Glucose-Capillary: 97 mg/dL (ref 70–99)

## 2021-01-03 LAB — PROCALCITONIN: Procalcitonin: <0.1 ng/mL

## 2021-01-03 LAB — D-DIMER, QUANTITATIVE
D-Dimer, Quant: 0.83 ug/mL-FEU — ABNORMAL HIGH (ref 0.00–0.50)
D-Dimer, Quant: 0.8 ug/mL-FEU — ABNORMAL HIGH (ref 0.00–0.50)

## 2021-01-03 LAB — C-REACTIVE PROTEIN
CRP: 0.6 mg/dL (ref <1.0)
CRP: 0.6 mg/dL (ref <1.0)

## 2021-01-03 LAB — MRSA PCR SCREENING: MRSA by PCR: NEGATIVE

## 2021-01-03 MED ORDER — SODIUM CHLORIDE 0.45 % IV SOLN: INTRAVENOUS | Status: DC

## 2021-01-03 MED ORDER — DEXTROSE 50 % IV SOLN
12.5000 g | INTRAVENOUS | Status: AC
  Administered 2021-01-03: 12.5 g via INTRAVENOUS
  Filled 2021-01-03: qty 50

## 2021-01-03 MED ORDER — CHLORHEXIDINE GLUCONATE CLOTH 2 % EX PADS
6.0000 | MEDICATED_PAD | Freq: Every day | CUTANEOUS | Status: DC
  Administered 2021-01-03 – 2021-01-05 (×2): 6 via TOPICAL

## 2021-01-03 MED ORDER — HYDRALAZINE HCL 20 MG/ML IJ SOLN
10.0000 mg | INTRAMUSCULAR | Status: DC | PRN
  Administered 2021-01-03: 22:00:00 20 mg via INTRAVENOUS
  Administered 2021-01-03: 05:00:00 10 mg via INTRAVENOUS
  Administered 2021-01-03 – 2021-01-16 (×26): 20 mg via INTRAVENOUS
  Administered 2021-01-26: 12:00:00 10 mg via INTRAVENOUS
  Administered 2021-01-27: 20 mg via INTRAVENOUS
  Filled 2021-01-03 (×30): qty 1

## 2021-01-03 MED ORDER — DEXTROSE 5 % IV SOLN: INTRAVENOUS | Status: DC

## 2021-01-03 NOTE — ED Notes (Signed)
Attempted report x2, assigned unit unable to take pt due to pts hypothermia and requirement of Bair hugger.  Hospitalist MD Nettey paged.

## 2021-01-03 NOTE — ED Notes (Signed)
Call sister [name] with an update, information in epic.

## 2021-01-03 NOTE — ED Notes (Signed)
Sister/POA Trudi Ida made aware of pt's bed assignment.

## 2021-01-03 NOTE — Progress Notes (Addendum)
Clinical/Bedside Swallow Evaluation Patient Details  Name: Desiree Pollard MRN: [id number] Date of Birth: 1946-06-22  Today's Date: 01/03/2021 Time: SLP Start Time (ACUTE ONLY): 1135 SLP Stop Time (ACUTE ONLY): 1210 SLP Time Calculation (min) (ACUTE ONLY): 35 min  Past Medical History:  Past Medical History:  Diagnosis Date  . Breast cancer ([place])    2017  . CVA (cerebral vascular accident) ([place]) 11/2018  . Dehydration 07/2020  . Diabetes mellitus without complication ([place])   . Diabetic retinopathy ([place])    PDR OU  . Frequent diarrhea 07/2020  . Hyperkalemia 07/2020  . Hypertensive retinopathy    OU  . Stroke [facility])    Past Surgical History:  Past Surgical History:  Procedure Laterality Date  . CATARACT EXTRACTION Right   . MASTECTOMY Bilateral    "2017" Left Mastectomy; "2019" Right Mastectomy  . PACEMAKER IMPLANT  2017   HPI:  Pt is a 75 yo female adm to [facility] with acute metabolic encephalopathy - COVID +, has h/o CVAs x2 - last October 2021, DM, CKD4, dementia, breast cancer.  Imaging of brain previously has shown Stable appearance of right frontal encephalomalacia and bilateral  lacunar type infarcts in the basal ganglia and cerebellar  hemispheres. . Expected evolution of encephalomalacia along the right  inferior cerebellum corresponding with a presumed subacute infarct  on comparison CT.  3. Chronic microvascular angiopathy and parenchymal volume loss.  CVAs have left pt with aphasia and left weakness per Admit note.  Most recent CXR concerning for potential infectious process.  Swallow eval ordered.   Assessment / Plan / Recommendation Clinical Impression  Pt presents with clinical indications concerning for oral and pharyngeal dysphagia with elevated risk of aspiration. She demonstrates baseline congested breathing - ? due to tongue blocking airway and/or secretions retained in pharynx/larynx without clearance.  Pt did not follow directions to cough but did verbalize "yes" to  questions *sometimes incorrectly* and weak voice noted.  When provided with oral intake, decreased labial seal, prolonged oral transiting presumed, anterior labial spillage of liquids, delayed swallow and oral retention noted.  SLP having to orally suction across all boluses x tsp water/ice to clear retained boluses.  Uncertain to baseline but per records from SNF, referral had been made for speech on 1/19.  At this time, due to pt's mentation and concern for aspiration - recommend pt be npo except tsps of water after oral care and medications via alternative means. Recommend MBS when pt is able to tolerate. SLP Visit Diagnosis: Dysphagia, unspecified (R13.10);Dysphagia, oral phase (R13.11);Dysphagia, oropharyngeal phase (R13.12)    Aspiration Risk    severe   Diet Recommendation NPO;Ice chips PRN after oral care   Medication Administration: Via alternative means    Other  Recommendations Oral Care Recommendations: Oral care QID Other Recommendations: Have oral suction available   Follow up Recommendations Skilled Nursing facility      Frequency and Duration     TBD       Prognosis Prognosis for Safe Diet Advancement: Fair      Swallow Study   General HPI: Pt is a 75 yo female adm to [facility] with acute metabolic encephalopathy - COVID +, has h/o CVAs x2 - last October 2021, DM, CKD4, dementia, breast cancer.  Imaging of brain previously has shown Stable appearance of right frontal encephalomalacia and bilateral  lacunar type infarcts in the basal ganglia and cerebellar  hemispheres. . Expected evolution of encephalomalacia along the right  inferior cerebellum corresponding with a presumed subacute infarct  on comparison CT.  3. Chronic microvascular angiopathy and parenchymal volume loss.  CVAs have left pt with aphasia and left weakness per Admit note.  Most recent CXR concerning for potential infectious process.  Swallow eval ordered. Type of Study: Bedside Swallow Evaluation Previous  Swallow Assessment: none in the system Diet Prior to this Study: Regular;Thin liquids Temperature Spikes Noted: No (on Bair Hugger for hypothermia) Respiratory Status: Room air History of Recent Intubation: No Behavior/Cognition: Distractible;Alert Oral Cavity Assessment: Within Functional Limits Oral Cavity - Dentition: Dentures, top;Dentures, bottom Self-Feeding Abilities: Total assist Patient Positioning: Upright in bed Baseline Vocal Quality: Low vocal intensity Volitional Cough: Cognitively unable to elicit Volitional Swallow: Unable to elicit    Oral/Motor/Sensory Function Overall Oral Motor/Sensory Function: Mild impairment Facial Strength: Other (Comment) Lingual Symmetry: Suspected CN XII (hypoglossal) dysfunction Lingual Strength: Reduced;Suspected CN XII (hypoglossal) dysfunction Velum: Other (comment)   Ice Chips Ice chips: Impaired Presentation: Spoon   Thin Liquid Thin Liquid: Impaired Presentation: Spoon Oral Phase Impairments: Reduced labial seal;Reduced lingual movement/coordination;Poor awareness of bolus Oral Phase Functional Implications: Oral residue    Nectar Thick Nectar Thick Liquid: Impaired Presentation: Spoon;Straw;Cup Oral Phase Impairments: Reduced labial seal;Reduced lingual movement/coordination;Poor awareness of bolus Oral phase functional implications: Oral holding;Oral residue;Right anterior spillage;Left anterior spillage Pharyngeal Phase Impairments: Suspected delayed Swallow   Honey Thick Honey Thick Liquid: Not tested   Puree Puree: Impaired Presentation: Spoon Oral Phase Impairments: Reduced labial seal;Reduced lingual movement/coordination;Poor awareness of bolus Oral Phase Functional Implications: Oral residue;Prolonged oral transit Pharyngeal Phase Impairments: Suspected delayed Swallow   Solid     Solid: Not tested      [name] 01/03/2021,12:42 PM   SLP paged Dr Lonny Prude with recommendations at 1240.  Thanks for this  order.   Kathleen Lime, MS [facility] SLP Acute Rehab Services Office [phone] Pager [phone]

## 2021-01-03 NOTE — Progress Notes (Signed)
   Patient seen and examined at bedside, patient admitted after midnight, please see earlier detailed admission note by Jennette Kettle, MD. Briefly, patient presented secondary to altered mental status. Also with hypernatremia and COVID-19 infection.  Subjective: No issues overnight. Patient reports no dyspnea.  BP (!) 164/85   Pulse (!) 59   Temp 99 F (37.2 C) (Rectal)   Resp (!) 22   Ht 5\' 7"  (1.702 m)   Wt 68 kg   SpO2 98%   BMI 23.49 kg/m   General exam: Appears calm and comfortable Respiratory system: Clear to auscultation. Respiratory effort normal. Cardiovascular system: S1 & S2 heard, RRR. No murmurs, rubs, gallops or clicks. Gastrointestinal system: Abdomen is nondistended, soft and nontender. No organomegaly or masses felt. Normal bowel sounds heard. Central nervous system: Alert and oriented. No focal neurological deficits. Musculoskeletal: No edema. No calf tenderness Skin: No cyanosis. No rashes Psychiatry: Judgement and insight appear normal. Mood & affect appropriate.   Brief assessment/Plan:  Acute metabolic encephalopathy Possibly secondary to hypernatremia and dehydration started on IV fluids. Urine culture obtained on admission however urinalysis does not suggest infection. Mentation seems baseline now.  Hypernatremia Likely secondary to poor oral intake. Sodium of 154 on admission with slight trend down. Started on 1/2 NS IV fluids -Continue 1/2 NS IV fluids -Repeat BMP this afternoon and tomorrow morning  COVID-19 infection No evidence of pneumonia. Asymptomatic. Started on Remdesivir on admission. CRP normal.  Leukopenia Secondary to COVID-19 infection with lymphocytopenia on differential.   CKD stage IV Creatinine is slightly worse but not far from baseline.  Anemia of chronic disease Likely secondary to kidney disease. Stable.  Thrombocytopenia  No evidence of bleeding. Likely related to infection. Patient is also on aspirin and Plavix for  history of stroke  History of stroke On aspirin and Plavix as an outpatient -Continue aspirin/Plavix  Family communication: None at bedside DVT prophylaxis: SCDs Disposition: Discharge back to facility likely in 24 hours if mentation improves to near baseline, hypernatremia improved, PT/OT recommendations  Cordelia Poche, MD [facility] 01/03/2021, 7:36 AM

## 2021-01-03 NOTE — ED Notes (Signed)
Pts rectal temp rechecked using 2nd temperature probe to verify accurracy, 89.9.  Hospitalist MD Nettey made aware.

## 2021-01-03 NOTE — H&P (Signed)
History and Physical    Desiree Burdine LFY:[id number] DOB: Apr 18, 1946 DOA: 01/02/2021  PCP: [name], FNP  Patient coming from: SNF  I have personally briefly reviewed patient's old medical records in [place]  Chief Complaint: AMS  HPI: Desiree Pollard is a 75 y.o. female with medical history significant of strokes, DM2, HTN, dementia, CKD4.  Per sister: pt with AMS since she visited on 1/17 which is new.  Does have left sided deficits and aphasia at baseline.  Able to ambulate with walker at baseline.  Pt apparently more somnolent and hallucinating.  No reported fevers.  Pt unable to contribute to history secondary to AMS.   ED Course: COVID+, sodium 154, BUN 42 up from 17 just last month.  Creat 2.98 up slightly from her 2.6 baseline.  CT head neg.   Review of Systems: Unable to perform due to AMS.  Past Medical History:  Diagnosis Date  . Breast cancer ([place])    2017  . CVA (cerebral vascular accident) ([place]) 11/2018  . Dehydration 07/2020  . Diabetes mellitus without complication ([place])   . Diabetic retinopathy ([place])    PDR OU  . Frequent diarrhea 07/2020  . Hyperkalemia 07/2020  . Hypertensive retinopathy    OU  . Stroke [facility]     Past Surgical History:  Procedure Laterality Date  . CATARACT EXTRACTION Right   . MASTECTOMY Bilateral    "2017" Left Mastectomy; "2019" Right Mastectomy  . PACEMAKER IMPLANT  2017     reports that she has quit smoking. She has never used smokeless tobacco. She reports previous alcohol use. She reports that she does not use drugs.  Allergies  Allergen Reactions  . Amlodipine Nausea And Vomiting    Pt refuses to take due to severe n/v that began after starting amlodipine and stopped after discontinuation.    Family History  Problem Relation Age of Onset  . Ovarian cancer Mother   . Heart disease Father   . Diabetes Sister   . Diabetes Brother   . Colon cancer Neg Hx   . Esophageal cancer Neg Hx   .  Pancreatic cancer Neg Hx   . Stomach cancer Neg Hx   . Liver disease Neg Hx      Prior to Admission medications   Medication Sig Start Date End Date Taking? Authorizing Provider  atorvastatin (LIPITOR) 80 MG tablet Take 80 mg by mouth daily.  10/13/18  Yes [provider]  carvedilol (COREG) 25 MG tablet Take 25 mg by mouth 2 (two) times daily with a meal.  10/13/18  Yes [provider]  clopidogrel (PLAVIX) 75 MG tablet Take 1 tablet (75 mg total) by mouth daily. 10/24/20  Yes Ghimire, Henreitta Leber, MD  dorzolamide-timolol (COSOPT) 22.3-6.8 MG/ML ophthalmic solution INSTILL 1 DROP INTO BOTH EYES TWICE A DAY Patient taking differently: Place 1 drop into both eyes 2 (two) times daily. 01/14/20  Yes Bernarda Caffey, MD  ferrous sulfate 325 (65 FE) MG tablet Take 325 mg by mouth every other day.   Yes [provider]  furosemide (LASIX) 40 MG tablet Take 40 mg by mouth daily.   Yes [provider]  hydrALAZINE (APRESOLINE) 25 MG tablet Take 1 tablet (25 mg total) by mouth See admin instructions. Take 50 mg by mouth TID. Patient taking differently: Take 25 mg by mouth in the morning and at bedtime. 11/26/20  Yes Samuella Cota, MD  insulin lispro (HUMALOG) 100 UNIT/ML injection Inject 0-6 Units into  the skin 3 (three) times daily. Sliding scale :  70-150= 0 units 151-200= 1 unit 201-250=  2 units 251-300= 3 units 301-350= 4 units 351-400= 5 units Over 400 = 6 units and call MD   Yes [provider]  loperamide (IMODIUM A-D) 2 MG tablet Take 1 tablet (2 mg total) by mouth 4 (four) times daily as needed for diarrhea or loose stools. 08/09/20  Yes [name], FNP  pantoprazole (PROTONIX) 40 MG tablet Take 1 tablet (40 mg total) by mouth daily. 10/24/20  Yes Ghimire, Henreitta Leber, MD  Accu-Chek FastClix Lancets MISC 1 each by Other route as directed. To test blood glucose daily. 05/24/19   [provider]  Blood Glucose Monitoring Suppl  (FIFTY50 GLUCOSE METER 2.0) w/Device KIT 1 each by Other route See admin instructions. Use to check blood sugars 08/31/18   [provider]  Insulin Pen Needle (FIFTY50 PEN NEEDLES) 32G X 4 MM MISC 1 each by Other route See admin instructions. Use as instructed. 11/17/16   [provider]    Physical Exam: Vitals:   01/02/21 2037 01/02/21 2130 01/02/21 2214 01/02/21 2230  BP: (!) 161/89 (!) 159/88 (!) 148/79 (!) 167/88  Pulse: 60 60 60 60  Resp: (!) 21 (!) 23 20 16   Temp:      TempSrc:      SpO2: 98% 98% 100% 99%  Weight:      Height:        Constitutional: Confused, NAD, sitting up in bed Eyes: PERRL, lids and conjunctivae normal ENMT: Mucous membranes are moist. Posterior pharynx clear of any exudate or lesions.Normal dentition.  Neck: normal, supple, no masses, no thyromegaly Respiratory: clear to auscultation bilaterally, no wheezing, no crackles. Normal respiratory effort. No accessory muscle use.  Cardiovascular: Regular rate and rhythm, no murmurs / rubs / gallops. No extremity edema. 2+ pedal pulses. No carotid bruits.  Abdomen: no tenderness, no masses palpated. No hepatosplenomegaly. Bowel sounds positive.  Musculoskeletal: no clubbing / cyanosis. No joint deformity upper and lower extremities. Good ROM, no contractures. Normal muscle tone.  Skin: no rashes, lesions, ulcers. No induration Neurologic: MAE, speech not comprehensible. Psychiatric: Confused   Labs on Admission: I have personally reviewed following labs and imaging studies  CBC: Recent Labs  Lab 01/02/21 1947  WBC 2.5*  NEUTROABS 1.8  HGB 8.2*  HCT 27.9*  MCV 97.2  PLT 80*   Basic Metabolic Panel: Recent Labs  Lab 01/02/21 1947  NA 154*  K 4.4  CL 122*  CO2 24  GLUCOSE 94  BUN 42*  CREATININE 2.98*  CALCIUM 9.3   GFR: Estimated Creatinine Clearance: 15.6 mL/min (A) (by C-G formula based on SCr of 2.98 mg/dL (H)). Liver Function Tests: Recent Labs  Lab 01/02/21 1947   AST 67*  ALT 93*  ALKPHOS 150*  BILITOT 0.4  PROT 6.9  ALBUMIN 3.0*   No results for input(s): LIPASE, AMYLASE in the last 168 hours. No results for input(s): AMMONIA in the last 168 hours. Coagulation Profile: No results for input(s): INR, PROTIME in the last 168 hours. Cardiac Enzymes: No results for input(s): CKTOTAL, CKMB, CKMBINDEX, TROPONINI in the last 168 hours. BNP (last 3 results) No results for input(s): PROBNP in the last 8760 hours. HbA1C: No results for input(s): HGBA1C in the last 72 hours. CBG: Recent Labs  Lab 01/02/21 2036  GLUCAP 83   Lipid Profile: No results for input(s): CHOL, HDL, LDLCALC, TRIG, CHOLHDL, LDLDIRECT in the last 72 hours.  Thyroid Function Tests: No results for input(s): TSH, T4TOTAL, FREET4, T3FREE, THYROIDAB in the last 72 hours. Anemia Panel: No results for input(s): VITAMINB12, FOLATE, FERRITIN, TIBC, IRON, RETICCTPCT in the last 72 hours. Urine analysis:    Component Value Date/Time   COLORURINE YELLOW 01/02/2021 2053   APPEARANCEUR CLEAR 01/02/2021 2053   LABSPEC 1.014 01/02/2021 2053   PHURINE 5.0 01/02/2021 2053   GLUCOSEU 50 (A) 01/02/2021 2053   HGBUR NEGATIVE 01/02/2021 2053   BILIRUBINUR NEGATIVE 01/02/2021 2053   KETONESUR NEGATIVE 01/02/2021 2053   PROTEINUR >=300 (A) 01/02/2021 2053   NITRITE NEGATIVE 01/02/2021 2053   LEUKOCYTESUR NEGATIVE 01/02/2021 2053    Radiological Exams on Admission: CT Head Wo Contrast  Result Date: 01/02/2021 CLINICAL DATA:  Mental status change, unknown cause. had 2 strokes, most recent last October. 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 Pt has weakness at baseline. EXAM: CT HEAD WITHOUT CONTRAST TECHNIQUE: Contiguous axial images were obtained from the base of the skull through the vertex without intravenous contrast. COMPARISON:  CT head 11/24/2020 FINDINGS: Brain: Cerebral ventricle sizes are concordant with the degree of cerebral volume loss. Patchy and confluent areas of  decreased attenuation are noted throughout the deep and periventricular white matter of the cerebral hemispheres bilaterally, compatible with chronic microvascular ischemic disease. Bilateral, right greater than left cerebellar encephalomalacia. Prior right basal ganglia lacunar infarction. No evidence of large-territorial acute infarction. No parenchymal hemorrhage. No mass lesion. No extra-axial collection. No mass effect or midline shift. No hydrocephalus. Basilar cisterns are patent. Vascular: No hyperdense vessel. Atherosclerotic calcifications are present within the cavernous internal carotid arteries. Skull: No acute fracture or focal lesion. Sinuses/Orbits: Paranasal sinuses and mastoid air cells are clear. The orbits are unremarkable. Other: None. IMPRESSION: No acute intracranial abnormality. Electronically Signed   By: Iven Finn M.D.   On: 01/02/2021 20:46   DG Chest Portable 1 View  Result Date: 01/02/2021 CLINICAL DATA:  Change in mental status EXAM: PORTABLE CHEST 1 VIEW COMPARISON:  November 24, 2020 FINDINGS: The heart size and mediastinal contours are mildly enlarged. There is hazy interstitial opacities seen throughout both lungs. Small bilateral pleural effusions are seen. The visualized skeletal structures are unremarkable. IMPRESSION: Diffuse interstitial opacities which may be due to interstitial edema and/or infectious etiology Small bilateral pleural effusions Electronically Signed   By: Prudencio Pair M.D.   On: 01/02/2021 20:26    EKG: Independently reviewed.  Assessment/Plan Principal Problem:   Acute metabolic encephalopathy Active Problems:   Insulin dependent type 2 diabetes mellitus ([place])   Hypertension   History of stroke   CKD (chronic kidney disease), stage IV ([place])   COVID-19 virus infection   Hypernatremia    1. Acute metabolic encephalopathy - 1. Due to COVID-19 and dehydration w/ hypernatremia 2. Hypernatremia - 1. Likely due to dehydration 2. IVF:  500cc bolus and then half NS at 100 3. Strict intake and output 4. BMP Q6H 3. COVID-19 1. No respiratory distress / PNA on CXR 2. But pt at high risk for worsening disease 3. Will start remdesivir 4. COVID pathway 5. Procalcitonin, CRP, D.Dimer pending 6. Daily labs 4. H/o Stroke - 1. Cont ASA+Plavix for the moment 5. Mild thrombocytopenia 1. Repeat CBC in AM 2. Will hold off on further anticoagulation beyond ASA+Plavix 6. CKD 4- 1. Slightly worse than baseline, likely from dehydration 2. Strict intake and output 3. Repeat BMP 4. IVF as above  DVT prophylaxis: SCDs, will leave on ASA + plavix though -  thrombocytopenia Code Status: Full Family Communication: No family in room Disposition Plan: SNF after mental status improved Consults called: None Admission status: Place in 45    Abbie Berling, [place] [facility]  How to contact the [facility] Attending or Consulting provider [place] or covering provider during after hours [place], for this patient?  1. Check the care team in [facility] and look for a) attending/consulting [facility] provider listed and b) the [facility] team listed 2. Log into [email or web address]  Amion Physician Scheduling and messaging for groups and whole hospitals  On call and physician scheduling software for group practices, residents, hospitalists and other medical providers for call, clinic, rotation and shift schedules. On[employer] is a hospital-wide system for scheduling doctors and paging doctors on call. EasyPlot is for scientific plotting and data analysis.  [email or web address]  and use [place]'s universal password to access. If you do not have the password, please contact the hospital operator.  3. Locate the [facility] provider you are looking for under [facility] and [place] to a number that you can be directly reached. 4. If you still have difficulty reaching the provider, please [place] the [facility] (Director on Call) for the Hospitalists listed on amion for  assistance.  01/03/2021, 12:10 AM

## 2021-01-03 NOTE — ED Notes (Addendum)
Pt's sister Trudi Ida [phone], she is also the POA given an update on the pt. Per [name], the pt was able to talk more clearly a week ago, and on Thurs when she went to visit she didn't like the way she looked, requesting that she be sent to the ER for an evaluation. [name] would like to speak with the attending physician to get a better understanding of what her diagnosis is and the plan of care.

## 2021-01-03 NOTE — ED Notes (Signed)
Attempted report, unable to give at this time due to staffing issues on receiving floor.

## 2021-01-03 NOTE — ED Notes (Signed)
Will transport pt to 1228-1. AAOX1. Pt in no apparent distress or pain. The opportunity to ask questions was provided.

## 2021-01-03 NOTE — ED Notes (Addendum)
ED TO INPATIENT HANDOFF REPORT  ED Nurse Name and Phone #:  Earnestine Leys- [phone]  S Name/Age/Gender [place] Feil 75 y.o. female Room/Bed: WA24/WA24  Code Status   Code Status: Full Code  Home/SNF/Other Skilled nursing facility Patient oriented to: self Is this baseline? Yes   Triage Complete: Triage complete  Chief Complaint Acute metabolic encephalopathy [W29.56]  Triage Note Pt BIB EMS from [facility]. Pt has had 2 strokes, most recent last October. 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 Pt has weakness at baseline. Pt sister was visiting pt last Monday 1/17 and noticed pt was making gargling sound. EMS reports pt is making gargling sound. Facility reports pt making gargling sound, pt is tachycardic. EMS reports pt was 99%-100% RA and HR 100. Pt sister reports pt has dementia. EMS reports pt has clear lung sounds.   183/93 CBG 111     Allergies Allergies  Allergen Reactions  . Amlodipine Nausea And Vomiting    Pt refuses to take due to severe n/v that began after starting amlodipine and stopped after discontinuation.    Level of Care/Admitting Diagnosis ED Disposition    ED Disposition Condition [facility] Area: [place] [100102]  Level of Care: Stepdown [14]  Admit to SDU based on following criteria: Hemodynamic compromise or significant risk of instability:  Patient requiring short term acute titration and management of vasoactive drips, and invasive monitoring (i.e., CVP and Arterial line).  May admit patient to [facility] or [facility] if equivalent level of care is available:: Yes  Covid Evaluation: Confirmed COVID Positive  Diagnosis: Acute metabolic encephalopathy [[id number]]  Admitting Physician: Etta Quill [[id number]]  Attending Physician: Mariel Aloe [phone]  Estimated length of stay: past midnight tomorrow  Certification:: I certify this patient will need inpatient services for at  least 2 midnights       B Medical/Surgery History Past Medical History:  Diagnosis Date  . Breast cancer ([place])    2017  . CVA (cerebral vascular accident) ([place]) 11/2018  . Dehydration 07/2020  . Diabetes mellitus without complication ([place])   . Diabetic retinopathy ([place])    PDR OU  . Frequent diarrhea 07/2020  . Hyperkalemia 07/2020  . Hypertensive retinopathy    OU  . Stroke [facility])    Past Surgical History:  Procedure Laterality Date  . CATARACT EXTRACTION Right   . MASTECTOMY Bilateral    "2017" Left Mastectomy; "2019" Right Mastectomy  . PACEMAKER IMPLANT  2017     A IV Location/Drains/Wounds Patient Lines/Drains/Airways Status    Active Line/Drains/Airways    Name Placement date Placement time Site Days   Peripheral IV 01/03/21 Distal;Left;Posterior Forearm 01/03/21  0042  Forearm  less than 1   External Urinary Catheter 11/24/20  0919  --  40          Intake/Output Last 24 hours  Intake/Output Summary (Last 24 hours) at 01/03/2021 2116 Last data filed at 01/03/2021 1740 Gross per 24 hour  Intake 2150.88 ml  Output --  Net 2150.88 ml    Labs/Imaging Results for orders placed or performed during the hospital encounter of 01/02/21 (from the past 48 hour(s))  Comprehensive metabolic panel     Status: Abnormal   Collection Time: 01/02/21  7:47 PM  Result Value Ref Range   Sodium 154 (H) 135 - 145 mmol/L   Potassium 4.4 3.5 - 5.1 mmol/L   Chloride 122 (H) 98 - 111 mmol/L  CO2 24 22 - 32 mmol/L   Glucose, Bld 94 70 - 99 mg/dL    Comment: Glucose reference range applies only to samples taken after fasting for at least 8 hours.   BUN 42 (H) 8 - 23 mg/dL   Creatinine, Ser 2.98 (H) 0.44 - 1.00 mg/dL   Calcium 9.3 8.9 - 10.3 mg/dL   Total Protein 6.9 6.5 - 8.1 g/dL   Albumin 3.0 (L) 3.5 - 5.0 g/dL   AST 67 (H) 15 - 41 U/L   ALT 93 (H) 0 - 44 U/L   Alkaline Phosphatase 150 (H) 38 - 126 U/L   Total Bilirubin 0.4 0.3 - 1.2 mg/dL   GFR, Estimated 16 (L) >60  mL/min    Comment: (NOTE) Calculated using the CKD-EPI Creatinine Equation (2021)    Anion gap 8 5 - 15    Comment: Performed at [facility], [place] [address], [place], [place] [address]  Lactic acid, plasma     Status: None   Collection Time: 01/02/21  7:47 PM  Result Value Ref Range   Lactic Acid, Venous 0.6 0.5 - 1.9 mmol/L    Comment: Performed at [facility], [place] [address], [place], [place] [address]  CBC with Differential     Status: Abnormal   Collection Time: 01/02/21  7:47 PM  Result Value Ref Range   WBC 2.5 (L) 4.0 - 10.5 K/uL   RBC 2.87 (L) 3.87 - 5.11 MIL/uL   Hemoglobin 8.2 (L) 12.0 - 15.0 g/dL   HCT 27.9 (L) 36.0 - 46.0 %   MCV 97.2 80.0 - 100.0 fL   MCH 28.6 26.0 - 34.0 pg   MCHC 29.4 (L) 30.0 - 36.0 g/dL   RDW 15.1 11.5 - 15.5 %   Platelets 80 (L) 150 - 400 K/uL    Comment: REPEATED TO VERIFY SPECIMEN CHECKED FOR CLOTS Immature Platelet Fraction may be clinically indicated, consider ordering this additional test [id number]    nRBC 1.6 (H) 0.0 - 0.2 %   Neutrophils Relative % 74 %   Neutro Abs 1.8 1.7 - 7.7 K/uL   Lymphocytes Relative 14 %   Lymphs Abs 0.3 (L) 0.7 - 4.0 K/uL   Monocytes Relative 8 %   Monocytes Absolute 0.2 0.1 - 1.0 K/uL   Eosinophils Relative 2 %   Eosinophils Absolute 0.0 0.0 - 0.5 K/uL   Basophils Relative 0 %   Basophils Absolute 0.0 0.0 - 0.1 K/uL   Immature Granulocytes 2 %   Abs Immature Granulocytes 0.04 0.00 - 0.07 K/uL   Giant PLTs PRESENT     Comment: Performed at [facility], [place] [address] [place], [place] [address]  Brain natriuretic peptide     Status: Abnormal   Collection Time: 01/02/21  7:47 PM  Result Value Ref Range   B Natriuretic Peptide 536.1 (H) 0.0 - 100.0 pg/mL    Comment: Performed at [facility], [place] [address], [place], [place] [address]  CBG monitoring, ED     Status: None   Collection Time: 01/02/21  8:36 PM  Result Value  Ref Range   Glucose-Capillary 83 70 - 99 mg/dL    Comment: Glucose reference range applies only to samples taken after fasting for at least 8 hours.  Urinalysis, Routine w reflex microscopic Urine, Catheterized     Status: Abnormal   Collection Time: 01/02/21  8:53 PM  Result Value Ref Range   Color, Urine YELLOW YELLOW  APPearance CLEAR CLEAR   Specific Gravity, Urine 1.014 1.005 - 1.030   pH 5.0 5.0 - 8.0   Glucose, UA 50 (A) NEGATIVE mg/dL   Hgb urine dipstick NEGATIVE NEGATIVE   Bilirubin Urine NEGATIVE NEGATIVE   Ketones, ur NEGATIVE NEGATIVE mg/dL   Protein, ur >=300 (A) NEGATIVE mg/dL   Nitrite NEGATIVE NEGATIVE   Leukocytes,Ua NEGATIVE NEGATIVE   RBC / HPF 0-5 0 - 5 RBC/hpf   WBC, UA 0-5 0 - 5 WBC/hpf   Bacteria, UA NONE SEEN NONE SEEN   Mucus PRESENT    Hyaline Casts, UA PRESENT     Comment: Performed at [facility], [place] [address] [place], [place] [address]  SARS Coronavirus 2 by RT PCR (hospital order, performed in [facility] hospital lab) Nasopharyngeal Nasopharyngeal Swab     Status: Abnormal   Collection Time: 01/02/21  9:00 PM   Specimen: Nasopharyngeal Swab  Result Value Ref Range   SARS Coronavirus 2 POSITIVE (A) NEGATIVE    Comment: CRITICAL RESULT CALLED TO, READ BACK BY AND VERIFIED WITH: DR Regenia Skeeter AT 2245 01/02/21 CRUICKSHANK A (NOTE) SARS-CoV-2 target nucleic acids are DETECTED  SARS-CoV-2 RNA is generally detectable in upper respiratory specimens  during the acute phase of infection.  Positive results are indicative  of the presence of the identified virus, but do not rule out bacterial infection or co-infection with other pathogens not detected by the test.  Clinical correlation with patient history and  other diagnostic information is necessary to determine patient infection status.  The expected result is negative.  Fact Sheet for Patients:   StrictlyIdeas.no   Fact Sheet for Healthcare Providers:    BankingDealers.co.za    This test is not yet approved or cleared by the [place] FDA and  has been authorized for detection and/or diagnosis of SARS-CoV-2 by FDA under an Emergency Use Authorization (EUA).  This EUA will remain in effect  (meaning this test can be used) for the duration of  the COVID-19 declaration under Section 564(b)(1) of the Act, 21 U.S.C. section 360-bbb-3(b)(1), unless the authorization is terminated or revoked sooner.  Performed at [facility], [place] [address] [place], [place] [address]   D-dimer, quantitative (not at [facility])     Status: Abnormal   Collection Time: 01/02/21 10:58 PM  Result Value Ref Range   D-Dimer, Quant 0.83 (H) 0.00 - 0.50 ug/mL-FEU    Comment: (NOTE) At the manufacturer cut-off value of 0.5 g/mL FEU, this assay has a negative predictive value of 95-100%.This assay is intended for use in conjunction with a clinical pretest probability (PTP) assessment model to exclude pulmonary embolism (PE) and deep venous thrombosis (DVT) in outpatients suspected of PE or DVT. Results should be correlated with clinical presentation. Performed at [facility], [place] [address] [place], [place] [address]   Procalcitonin     Status: None   Collection Time: 01/02/21 10:58 PM  Result Value Ref Range   Procalcitonin <0.10 ng/mL    Comment:        Interpretation: PCT (Procalcitonin) <= 0.5 ng/mL: Systemic infection (sepsis) is not likely. Local bacterial infection is possible. (NOTE)       Sepsis PCT Algorithm           Lower Respiratory Tract  Infection PCT Algorithm    ----------------------------     ----------------------------         PCT < 0.25 ng/mL                PCT < 0.10 ng/mL          Strongly encourage             Strongly discourage   discontinuation of antibiotics    initiation of antibiotics    ----------------------------      -----------------------------       PCT 0.25 - 0.50 ng/mL            PCT 0.10 - 0.25 ng/mL               OR       >80% decrease in PCT            Discourage initiation of                                            antibiotics      Encourage discontinuation           of antibiotics    ----------------------------     -----------------------------         PCT >= 0.50 ng/mL              PCT 0.26 - 0.50 ng/mL               AND        <80% decrease in PCT             Encourage initiation of                                             antibiotics       Encourage continuation           of antibiotics    ----------------------------     -----------------------------        PCT >= 0.50 ng/mL                  PCT > 0.50 ng/mL               AND         increase in PCT                  Strongly encourage                                      initiation of antibiotics    Strongly encourage escalation           of antibiotics                                     -----------------------------                                           PCT <= 0.25 ng/mL  OR                                        > 80% decrease in PCT                                      Discontinue / Do not initiate                                             antibiotics  Performed at [place] [address], [place], [place] [address]   C-reactive protein     Status: None   Collection Time: 01/02/21 10:58 PM  Result Value Ref Range   CRP 0.6 <1.0 mg/dL    Comment: Performed at [facility], [place] [address] [place], [place] [address]  Hemoglobin A1c     Status: Abnormal   Collection Time: 01/02/21 11:07 PM  Result Value Ref Range   Hgb A1c MFr Bld 5.7 (H) 4.8 - 5.6 %    Comment: (NOTE) Pre diabetes:          5.7%-6.4%  Diabetes:              >6.4%  Glycemic control for   <7.0% adults with diabetes    Mean Plasma Glucose 116.89  mg/dL    Comment: Performed at [place] [address] [place], [place] [address]  C-reactive protein     Status: None   Collection Time: 01/03/21  4:40 AM  Result Value Ref Range   CRP 0.6 <1.0 mg/dL    Comment: Performed at [facility], [place] [address] [place], [place] [address]  CBC with Differential/Platelet     Status: Abnormal   Collection Time: 01/03/21  4:48 AM  Result Value Ref Range   WBC 2.8 (L) 4.0 - 10.5 K/uL   RBC 2.80 (L) 3.87 - 5.11 MIL/uL   Hemoglobin 7.9 (L) 12.0 - 15.0 g/dL   HCT 27.3 (L) 36.0 - 46.0 %   MCV 97.5 80.0 - 100.0 fL   MCH 28.2 26.0 - 34.0 pg   MCHC 28.9 (L) 30.0 - 36.0 g/dL   RDW 15.3 11.5 - 15.5 %   Platelets 71 (L) 150 - 400 K/uL    Comment: REPEATED TO VERIFY Immature Platelet Fraction may be clinically indicated, consider ordering this additional test [id number] CONSISTENT WITH PREVIOUS RESULT    nRBC 1.4 (H) 0.0 - 0.2 %   Neutrophils Relative % 78 %   Neutro Abs 2.2 1.7 - 7.7 K/uL   Lymphocytes Relative 12 %   Lymphs Abs 0.3 (L) 0.7 - 4.0 K/uL   Monocytes Relative 6 %   Monocytes Absolute 0.2 0.1 - 1.0 K/uL   Eosinophils Relative 2 %   Eosinophils Absolute 0.1 0.0 - 0.5 K/uL   Basophils Relative 1 %   Basophils Absolute 0.0 0.0 - 0.1 K/uL   Immature Granulocytes 1 %   Abs Immature Granulocytes 0.03 0.00 - 0.07 K/uL    Comment: Performed at [facility], [place] [address] [place], [place] [address]  Comprehensive metabolic panel     Status: Abnormal   Collection Time: 01/03/21  4:48  AM  Result Value Ref Range   Sodium 153 (H) 135 - 145 mmol/L   Potassium 4.3 3.5 - 5.1 mmol/L   Chloride 121 (H) 98 - 111 mmol/L   CO2 23 22 - 32 mmol/L   Glucose, Bld 95 70 - 99 mg/dL    Comment: Glucose reference range applies only to samples taken after fasting for at least 8 hours.   BUN 46 (H) 8 - 23 mg/dL   Creatinine, Ser 2.95 (H) 0.44 - 1.00 mg/dL   Calcium 9.0 8.9 - 10.3 mg/dL   Total Protein 6.6  6.5 - 8.1 g/dL   Albumin 2.8 (L) 3.5 - 5.0 g/dL   AST 58 (H) 15 - 41 U/L   ALT 86 (H) 0 - 44 U/L   Alkaline Phosphatase 140 (H) 38 - 126 U/L   Total Bilirubin 0.5 0.3 - 1.2 mg/dL   GFR, Estimated 16 (L) >60 mL/min    Comment: (NOTE) Calculated using the CKD-EPI Creatinine Equation (2021)    Anion gap 9 5 - 15    Comment: Performed at [facility], [place] [address] [place], [place] [address]  D-dimer, quantitative (not at [facility])     Status: Abnormal   Collection Time: 01/03/21  4:48 AM  Result Value Ref Range   D-Dimer, Quant 0.80 (H) 0.00 - 0.50 ug/mL-FEU    Comment: (NOTE) At the manufacturer cut-off value of 0.5 g/mL FEU, this assay has a negative predictive value of 95-100%.This assay is intended for use in conjunction with a clinical pretest probability (PTP) assessment model to exclude pulmonary embolism (PE) and deep venous thrombosis (DVT) in outpatients suspected of PE or DVT. Results should be correlated with clinical presentation. Performed at [facility], [place] [address] [place], [place] [address]   CBG monitoring, ED     Status: None   Collection Time: 01/03/21  8:05 AM  Result Value Ref Range   Glucose-Capillary 82 70 - 99 mg/dL    Comment: Glucose reference range applies only to samples taken after fasting for at least 8 hours.  Basic metabolic panel     Status: Abnormal   Collection Time: 01/03/21 11:00 AM  Result Value Ref Range   Sodium 152 (H) 135 - 145 mmol/L   Potassium 4.1 3.5 - 5.1 mmol/L   Chloride 120 (H) 98 - 111 mmol/L   CO2 22 22 - 32 mmol/L   Glucose, Bld 88 70 - 99 mg/dL    Comment: Glucose reference range applies only to samples taken after fasting for at least 8 hours.   BUN 43 (H) 8 - 23 mg/dL   Creatinine, Ser 2.80 (H) 0.44 - 1.00 mg/dL   Calcium 9.0 8.9 - 10.3 mg/dL   GFR, Estimated 17 (L) >60 mL/min    Comment: (NOTE) Calculated using the CKD-EPI Creatinine Equation (2021)    Anion gap 10 5 - 15     Comment: Performed at [facility], [place] [address] [place], [place] [address]  CBG monitoring, ED     Status: None   Collection Time: 01/03/21 11:55 AM  Result Value Ref Range   Glucose-Capillary 82 70 - 99 mg/dL    Comment: Glucose reference range applies only to samples taken after fasting for at least 8 hours.  CBG monitoring, ED     Status: Abnormal   Collection Time: 01/03/21  4:46 PM  Result Value Ref Range   Glucose-Capillary 60 (L) 70 - 99 mg/dL  Comment: Glucose reference range applies only to samples taken after fasting for at least 8 hours.   Comment 1 Notify RN   CBG monitoring, ED     Status: None   Collection Time: 01/03/21  6:18 PM  Result Value Ref Range   Glucose-Capillary 99 70 - 99 mg/dL    Comment: Glucose reference range applies only to samples taken after fasting for at least 8 hours.  CBG monitoring, ED     Status: None   Collection Time: 01/03/21  8:02 PM  Result Value Ref Range   Glucose-Capillary 97 70 - 99 mg/dL    Comment: Glucose reference range applies only to samples taken after fasting for at least 8 hours.   CT Head Wo Contrast  Result Date: 01/02/2021 CLINICAL DATA:  Mental status change, unknown cause. had 2 strokes, most recent last October. 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 Pt has weakness at baseline. EXAM: CT HEAD WITHOUT CONTRAST TECHNIQUE: Contiguous axial images were obtained from the base of the skull through the vertex without intravenous contrast. COMPARISON:  CT head 11/24/2020 FINDINGS: Brain: Cerebral ventricle sizes are concordant with the degree of cerebral volume loss. Patchy and confluent areas of decreased attenuation are noted throughout the deep and periventricular white matter of the cerebral hemispheres bilaterally, compatible with chronic microvascular ischemic disease. Bilateral, right greater than left cerebellar encephalomalacia. Prior right basal ganglia lacunar infarction. No evidence of  large-territorial acute infarction. No parenchymal hemorrhage. No mass lesion. No extra-axial collection. No mass effect or midline shift. No hydrocephalus. Basilar cisterns are patent. Vascular: No hyperdense vessel. Atherosclerotic calcifications are present within the cavernous internal carotid arteries. Skull: No acute fracture or focal lesion. Sinuses/Orbits: Paranasal sinuses and mastoid air cells are clear. The orbits are unremarkable. Other: None. IMPRESSION: No acute intracranial abnormality. Electronically Signed   By: Iven Finn M.D.   On: 01/02/2021 20:46   DG Chest Portable 1 View  Result Date: 01/02/2021 CLINICAL DATA:  Change in mental status EXAM: PORTABLE CHEST 1 VIEW COMPARISON:  November 24, 2020 FINDINGS: The heart size and mediastinal contours are mildly enlarged. There is hazy interstitial opacities seen throughout both lungs. Small bilateral pleural effusions are seen. The visualized skeletal structures are unremarkable. IMPRESSION: Diffuse interstitial opacities which may be due to interstitial edema and/or infectious etiology Small bilateral pleural effusions Electronically Signed   By: Prudencio Pair M.D.   On: 01/02/2021 20:26    Pending Labs Unresulted Labs (From admission, onward)          Start     Ordered   01/03/21 1635  Culture, blood (routine x 2)  BLOOD CULTURE X 2,   R (with STAT occurrences)      01/03/21 1634   01/03/21 0500  CBC with Differential/Platelet  Daily,   R      01/02/21 2257   01/03/21 0500  Comprehensive metabolic panel  Daily,   R      01/02/21 2257   01/03/21 0500  C-reactive protein  Daily,   R      01/02/21 2257   01/03/21 0500  D-dimer, quantitative (not at [facility])  Daily,   R      01/02/21 2257   01/03/21 9147  Basic metabolic panel  Now then every 6 hours,   R (with STAT occurrences)      01/03/21 0022   01/02/21 1947  Urine culture  ONCE - STAT,   STAT  01/02/21 1947          Vitals/Pain Today's Vitals   01/03/21 1830  01/03/21 1900 01/03/21 1930 01/03/21 2000  BP: (!) 155/94 (!) 161/83 (!) 174/80 (!) 165/87  Pulse: (!) 57 61 (!) 59 60  Resp: 18 20 (!) 22 (!) 24  Temp:      TempSrc:      SpO2: 99% 94% 100% 98%  Weight:      Height:        Isolation Precautions Airborne and Contact precautions  Medications Medications  remdesivir 200 mg in sodium chloride 0.9% 250 mL IVPB (0 mg Intravenous Stopped 01/03/21 0147)    Followed by  remdesivir 100 mg in sodium chloride 0.9 % 100 mL IVPB (0 mg Intravenous Stopped 01/03/21 1122)  acetaminophen (TYLENOL) tablet 650 mg (has no administration in time range)  ondansetron (ZOFRAN) tablet 4 mg (has no administration in time range)    Or  ondansetron (ZOFRAN) injection 4 mg (has no administration in time range)  clopidogrel (PLAVIX) tablet 75 mg (75 mg Oral Not Given 01/03/21 0916)  atorvastatin (LIPITOR) tablet 80 mg (80 mg Oral Not Given 01/03/21 0917)  carvedilol (COREG) tablet 25 mg (0 mg Oral Hold 01/03/21 0840)  ferrous sulfate tablet 325 mg (325 mg Oral Not Given 01/03/21 0917)  dorzolamide-timolol (COSOPT) 22.3-6.8 MG/ML ophthalmic solution 1 drop (1 drop Both Eyes Given 01/03/21 1049)  hydrALAZINE (APRESOLINE) tablet 25 mg (25 mg Oral Not Given 01/03/21 0917)  pantoprazole (PROTONIX) EC tablet 40 mg (40 mg Oral Not Given 01/03/21 0917)  insulin aspart (novoLOG) injection 0-9 Units (0 Units Subcutaneous Not Given 01/03/21 1207)  hydrALAZINE (APRESOLINE) injection 10-20 mg (20 mg Intravenous Given 01/03/21 1053)  dextrose 5 % solution ( Intravenous New Bag/Given 01/03/21 1741)  sodium chloride 0.9 % bolus 500 mL (0 mLs Intravenous Stopped 01/03/21 0147)  dextrose 50 % solution 12.5 g (12.5 g Intravenous Given 01/03/21 1741)    Mobility non-ambulatory High fall risk   Focused Assessments Cardiac Assessment Handoff:  Cardiac Rhythm: Normal sinus rhythm No results found for: CKTOTAL, CKMB, CKMBINDEX, TROPONINI Lab Results  Component Value Date   DDIMER 0.80  (H) 01/03/2021   Does the Patient currently have chest pain? No  , Neuro Assessment Handoff:  Swallow screen pass? No  Cardiac Rhythm: Normal sinus rhythm       Neuro Assessment:   Neuro Checks:      Last Documented NIHSS Modified Score:   Has TPA been given? No If patient is a Neuro Trauma and patient is going to OR before floor call report to [place] nurse: [phone] or [phone]  , Renal Assessment Handoff:  Hemodialysis Schedule: None Last Hemodialysis date and time: Pt is not on dialysis.   Restricted appendage: None that I am aware.     R Recommendations: See Admitting Provider Note  Report given to:   Additional Notes:

## 2021-01-03 NOTE — ED Notes (Signed)
First attempt to call report to [phone].

## 2021-01-03 NOTE — ED Notes (Signed)
Pts rectal temp noted to be 88.6 after unable to obtain oral temp for pt.  Admitting provider MD [facility] paged for further instruction. Bair hugger applied.

## 2021-01-04 ENCOUNTER — Inpatient Hospital Stay (HOSPITAL_COMMUNITY): Payer: Medicare HMO

## 2021-01-04 DIAGNOSIS — U071 COVID-19: Secondary | ICD-10-CM | POA: Diagnosis not present

## 2021-01-04 LAB — COMPREHENSIVE METABOLIC PANEL
ALT: 74 U/L — ABNORMAL HIGH (ref 0–44)
AST: 48 U/L — ABNORMAL HIGH (ref 15–41)
Albumin: 2.7 g/dL — ABNORMAL LOW (ref 3.5–5.0)
Alkaline Phosphatase: 136 U/L — ABNORMAL HIGH (ref 38–126)
Anion gap: 11 (ref 5–15)
BUN: 43 mg/dL — ABNORMAL HIGH (ref 8–23)
CO2: 21 mmol/L — ABNORMAL LOW (ref 22–32)
Calcium: 8.9 mg/dL (ref 8.9–10.3)
Chloride: 118 mmol/L — ABNORMAL HIGH (ref 98–111)
Creatinine, Ser: 2.66 mg/dL — ABNORMAL HIGH (ref 0.44–1.00)
GFR, Estimated: 18 mL/min — ABNORMAL LOW (ref >60)
Glucose, Bld: 100 mg/dL — ABNORMAL HIGH (ref 70–99)
Potassium: 3.9 mmol/L (ref 3.5–5.1)
Sodium: 150 mmol/L — ABNORMAL HIGH (ref 135–145)
Total Bilirubin: 0.4 mg/dL (ref 0.3–1.2)
Total Protein: 6.2 g/dL — ABNORMAL LOW (ref 6.5–8.1)

## 2021-01-04 LAB — BASIC METABOLIC PANEL
Anion gap: 12 (ref 5–15)
Anion gap: 8 (ref 5–15)
BUN: 42 mg/dL — ABNORMAL HIGH (ref 8–23)
BUN: 46 mg/dL — ABNORMAL HIGH (ref 8–23)
CO2: 17 mmol/L — ABNORMAL LOW (ref 22–32)
CO2: 21 mmol/L — ABNORMAL LOW (ref 22–32)
Calcium: 8.9 mg/dL (ref 8.9–10.3)
Calcium: 9 mg/dL (ref 8.9–10.3)
Chloride: 122 mmol/L — ABNORMAL HIGH (ref 98–111)
Chloride: 119 mmol/L — ABNORMAL HIGH (ref 98–111)
Creatinine, Ser: 2.87 mg/dL — ABNORMAL HIGH (ref 0.44–1.00)
Creatinine, Ser: 3.13 mg/dL — ABNORMAL HIGH (ref 0.44–1.00)
GFR, Estimated: 16 mL/min — ABNORMAL LOW (ref >60)
GFR, Estimated: 15 mL/min — ABNORMAL LOW (ref >60)
Glucose, Bld: 97 mg/dL (ref 70–99)
Glucose, Bld: 89 mg/dL (ref 70–99)
Potassium: 4.4 mmol/L (ref 3.5–5.1)
Potassium: 3.7 mmol/L (ref 3.5–5.1)
Sodium: 151 mmol/L — ABNORMAL HIGH (ref 135–145)
Sodium: 148 mmol/L — ABNORMAL HIGH (ref 135–145)

## 2021-01-04 LAB — URINE CULTURE: Culture: NO GROWTH

## 2021-01-04 LAB — CBC WITH DIFFERENTIAL/PLATELET
Abs Immature Granulocytes: 0.04 10*3/uL (ref 0.00–0.07)
Basophils Absolute: 0 10*3/uL (ref 0.0–0.1)
Basophils Relative: 1 %
Eosinophils Absolute: 0 10*3/uL (ref 0.0–0.5)
Eosinophils Relative: 1 %
HCT: 27.2 % — ABNORMAL LOW (ref 36.0–46.0)
Hemoglobin: 7.8 g/dL — ABNORMAL LOW (ref 12.0–15.0)
Immature Granulocytes: 2 %
Lymphocytes Relative: 16 %
Lymphs Abs: 0.4 10*3/uL — ABNORMAL LOW (ref 0.7–4.0)
MCH: 28.3 pg (ref 26.0–34.0)
MCHC: 28.7 g/dL — ABNORMAL LOW (ref 30.0–36.0)
MCV: 98.6 fL (ref 80.0–100.0)
Monocytes Absolute: 0.2 10*3/uL (ref 0.1–1.0)
Monocytes Relative: 8 %
Neutro Abs: 1.7 10*3/uL (ref 1.7–7.7)
Neutrophils Relative %: 72 %
Platelets: 70 10*3/uL — ABNORMAL LOW (ref 150–400)
RBC: 2.76 MIL/uL — ABNORMAL LOW (ref 3.87–5.11)
RDW: 15.6 % — ABNORMAL HIGH (ref 11.5–15.5)
WBC: 2.3 10*3/uL — ABNORMAL LOW (ref 4.0–10.5)
nRBC: 4.3 % — ABNORMAL HIGH (ref 0.0–0.2)

## 2021-01-04 LAB — GLUCOSE, CAPILLARY
Glucose-Capillary: 75 mg/dL (ref 70–99)
Glucose-Capillary: 83 mg/dL (ref 70–99)
Glucose-Capillary: 89 mg/dL (ref 70–99)
Glucose-Capillary: 90 mg/dL (ref 70–99)
Glucose-Capillary: 93 mg/dL (ref 70–99)

## 2021-01-04 LAB — C-REACTIVE PROTEIN: CRP: <0.5 mg/dL (ref <1.0)

## 2021-01-04 LAB — D-DIMER, QUANTITATIVE: D-Dimer, Quant: 0.9 ug/mL-FEU — ABNORMAL HIGH (ref 0.00–0.50)

## 2021-01-04 IMAGING — DX DG CHEST 1V PORT
1 series · 1 of 1 positions shown · non-contrast
Comparison: Chest radiograph [DATE].

CLINICAL DATA: [SL].  Shortness of breath.

EXAM:
PORTABLE CHEST 1 VIEW

[chest ap]
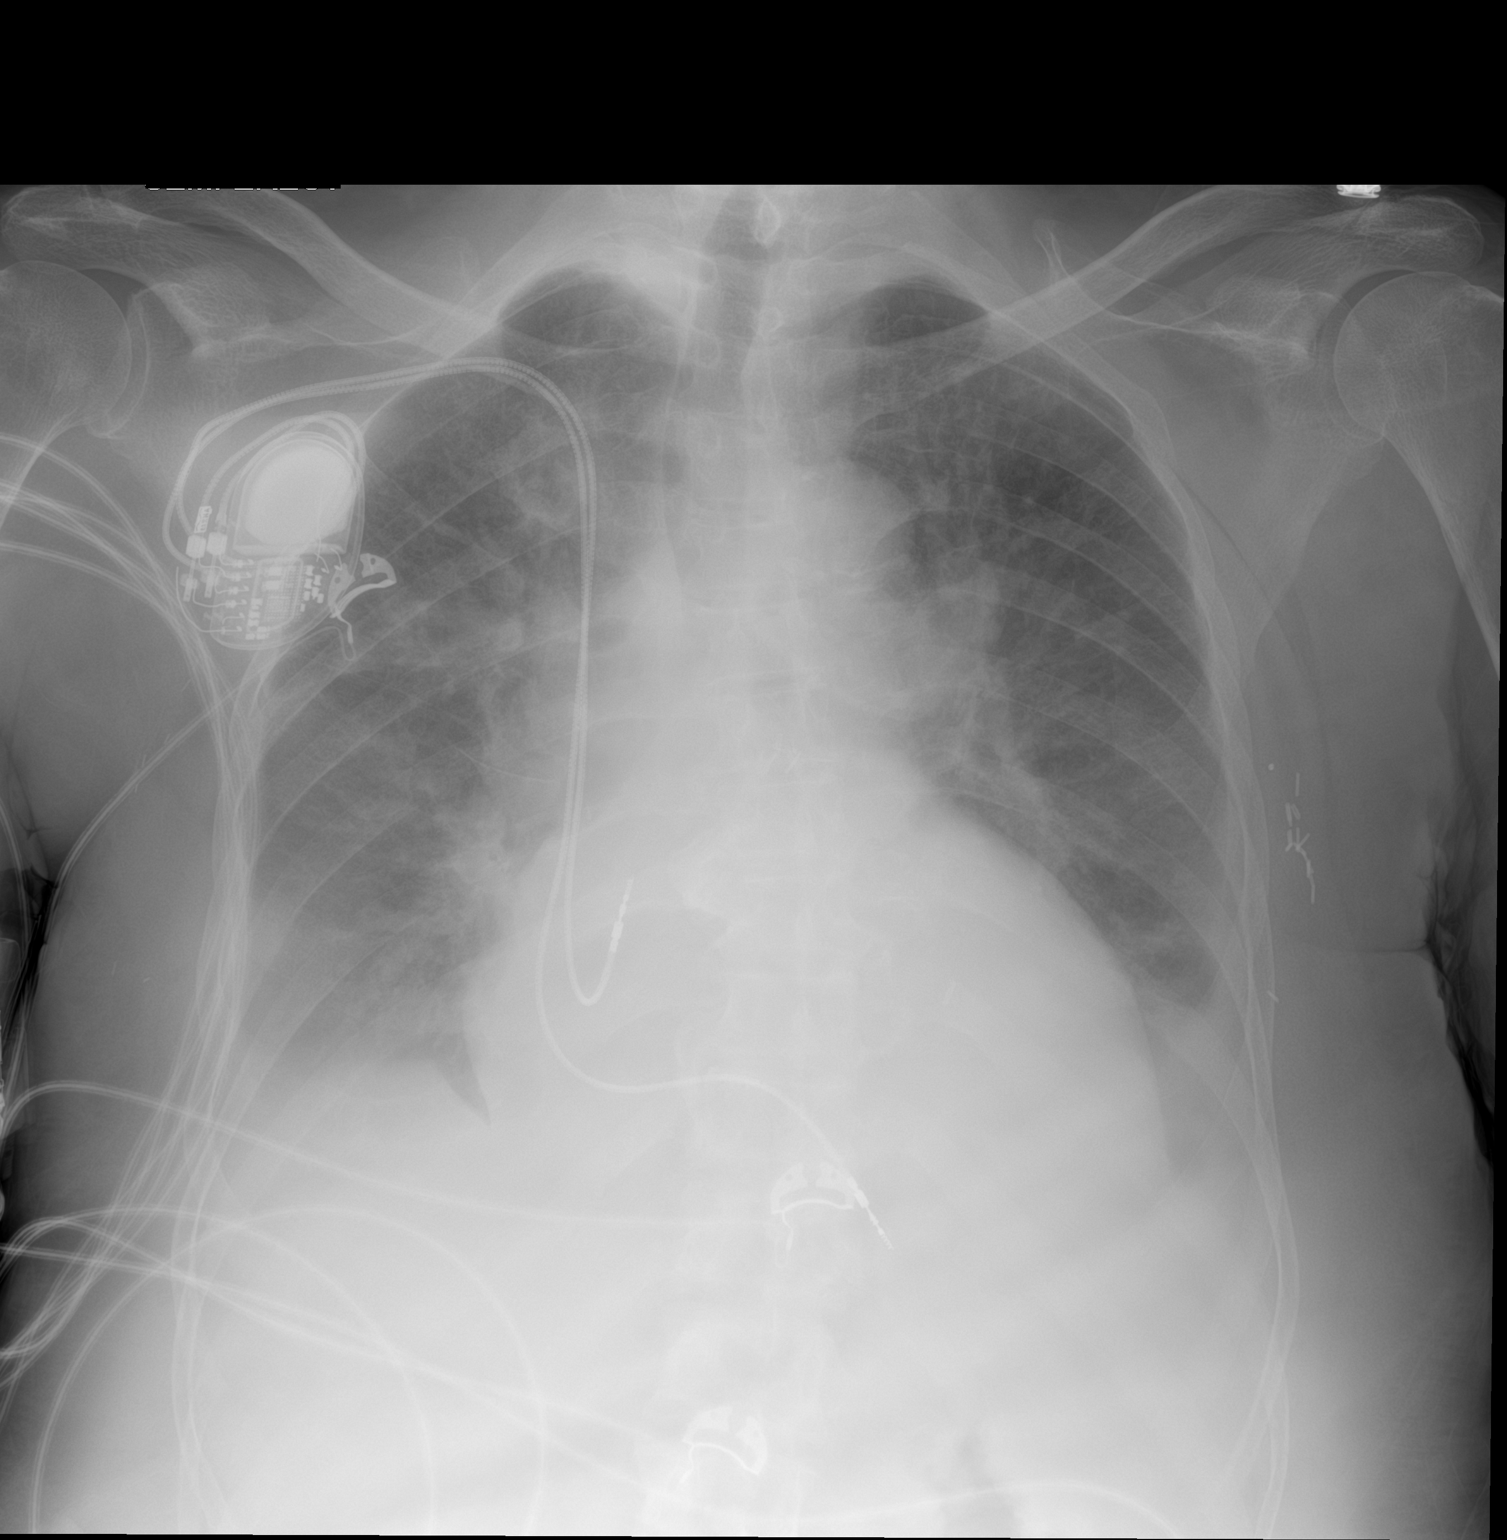

[1 of 1 positions shown; findings below may reference images not displayed]

FINDINGS: Multi lead pacer apparatus overlies the right hemithorax. Stable
enlarged cardiac and mediastinal contours. Similar-appearing diffuse
bilateral airspace opacities. Small bilateral pleural effusions. No
pneumothorax. Thoracic spine degenerative changes. Surgical clips
left chest wall.
IMPRESSION: Similar-appearing diffuse bilateral airspace opacities which may
represent edema or infection. Small bilateral pleural effusions.

## 2021-01-04 MED ORDER — ORAL CARE MOUTH RINSE
15.0000 mL | Freq: Two times a day (BID) | OROMUCOSAL | Status: DC
  Administered 2021-01-05 – 2021-02-18 (×83): 15 mL via OROMUCOSAL

## 2021-01-04 MED ORDER — CHLORHEXIDINE GLUCONATE 0.12 % MT SOLN
15.0000 mL | Freq: Two times a day (BID) | OROMUCOSAL | Status: DC
  Administered 2021-01-05 – 2021-02-19 (×89): 15 mL via OROMUCOSAL
  Filled 2021-01-04 (×85): qty 15

## 2021-01-04 NOTE — Progress Notes (Signed)
OT Cancellation Note  Patient Details Name: Kaysey Berndt MRN: [id number] DOB: 1946/07/09   Cancelled Treatment:    Reason Eval/Treat Not Completed: Fatigue/lethargy limiting ability to participate: Reviewed physical therapy's evaluation this date, and pt not opening eyes or participating with therapy at this time. Will hold OT Evaluation for today and check back to assess whether pt is able to engage with therapy.    Julien Girt 01/04/2021, 1:03 PM

## 2021-01-04 NOTE — Procedures (Deleted)
Objective Swallowing Evaluation: Type of Study: MBS-Modified Barium Swallow Study   Patient Details  Name: Desiree Pollard: [id number] Date of Birth: [birth year]  Today's Date: 01/04/2021 Time: SLP Start Time (ACUTE ONLY): 1528 -SLP Stop Time (ACUTE ONLY): 1538  SLP Time Calculation (min) (ACUTE ONLY): 10 min   Past Medical History:  Past Medical History:  Diagnosis Date  . Breast cancer ([place])    2017  . CVA (cerebral vascular accident) ([place]) 11/2018  . Dehydration 07/2020  . Diabetes mellitus without complication ([place])   . Diabetic retinopathy ([place])    PDR OU  . Frequent diarrhea 07/2020  . Hyperkalemia 07/2020  . Hypertensive retinopathy    OU  . Stroke [facility])    Past Surgical History:  Past Surgical History:  Procedure Laterality Date  . CATARACT EXTRACTION Right   . MASTECTOMY Bilateral    "2017" Left Mastectomy; "2019" Right Mastectomy  . PACEMAKER IMPLANT  2017   HPI: Interval placement of left internal jugular catheter with distal tip  in expected position of SVC. Mild bibasilar atelectasis or  infiltrates are noted   Subjective: Pt required max cues during MBS.    Assessment / Plan / Recommendation  CHL IP CLINICAL IMPRESSIONS 01/04/2021  Clinical Impression Limited MBS due to pt's decreased mentation.  Orally, puree and thin via tsp were expelled anteriorly from oral cavity despite max verbal/tactile cues given by SLP/Radiology tech with eventual removal from oral cavity. Mid-tongue press, bolus placed on alveolar ridge, various placements on tongue and this did not elicit bolus propulsion posteriorly and into pharynx to initiate the swallow.  Continue NPO until pt's mentation improved; repeat MBS prn to determine safest diet consistency.  SLP Visit Diagnosis Dysphagia, oropharyngeal phase (R13.12)  Attention and concentration deficit following --  Frontal lobe and executive function deficit following --  Impact on safety and function Mild aspiration risk;Risk  for inadequate nutrition/hydration      CHL IP TREATMENT RECOMMENDATION 01/04/2021  Treatment Recommendations Therapy as outlined in treatment plan below     Prognosis 01/04/2021  Prognosis for Safe Diet Advancement Good  Barriers to Reach Goals --  Barriers/Prognosis Comment --    CHL IP DIET RECOMMENDATION 01/04/2021  SLP Diet Recommendations NPO  Liquid Administration via --  Medication Administration Via alternative means  Compensations --  Postural Changes --      CHL IP OTHER RECOMMENDATIONS 01/04/2021  Recommended Consults --  Oral Care Recommendations Oral care QID  Other Recommendations --      CHL IP FOLLOW UP RECOMMENDATIONS 01/04/2021  Follow up Recommendations Other (comment)      CHL IP FREQUENCY AND DURATION 01/04/2021  Speech Therapy Frequency (ACUTE ONLY) min 2x/week  Treatment Duration 1 week           CHL IP ORAL PHASE 01/04/2021  Oral Phase Impaired  Oral - Pudding Teaspoon --  Oral - Pudding Cup --  Oral - Honey Teaspoon --  Oral - Honey Cup --  Oral - Nectar Teaspoon --  Oral - Nectar Cup --  Oral - Nectar Straw --  Oral - Thin Teaspoon Left anterior bolus loss;Right anterior bolus loss;Weak lingual manipulation  Oral - Thin Cup --  Oral - Thin Straw --  Oral - Puree Left anterior bolus loss;Right anterior bolus loss;Weak lingual manipulation  Oral - Mech Soft --  Oral - Regular --  Oral - Multi-Consistency --  Oral - Pill --  Oral Phase - Comment --    CHL IP PHARYNGEAL PHASE  01/04/2021  Pharyngeal Phase Impaired  Pharyngeal- Pudding Teaspoon --  Pharyngeal --  Pharyngeal- Pudding Cup --  Pharyngeal --  Pharyngeal- Honey Teaspoon --  Pharyngeal --  Pharyngeal- Honey Cup --  Pharyngeal --  Pharyngeal- Nectar Teaspoon --  Pharyngeal --  Pharyngeal- Nectar Cup --  Pharyngeal --  Pharyngeal- Nectar Straw --  Pharyngeal --  Pharyngeal- Thin Teaspoon (No Data)  Pharyngeal --  Pharyngeal- Thin Cup --  Pharyngeal --  Pharyngeal- Thin  Straw --  Pharyngeal --  Pharyngeal- Puree (No Data)  Pharyngeal --  Pharyngeal- Mechanical Soft --  Pharyngeal --  Pharyngeal- Regular --  Pharyngeal --  Pharyngeal- Multi-consistency --  Pharyngeal --  Pharyngeal- Pill --  Pharyngeal --  Pharyngeal Comment Bolus did not reach pharynx with either bolus despite max verbal/tactile cues     No flowsheet data found.   Elvina Sidle, M.S., CCC-SLP 01/04/2021, 9:42 PM

## 2021-01-04 NOTE — Consult Note (Addendum)
Modified Barium Swallow Progress Note/(Treatment charged d/t pt participation)  Patient Details  Name: Desiree Pollard MRN: [id number] Date of Birth: [birth year]  Today's Date: 01/04/2021  Modified Barium Swallow completed.  Full report located under Chart Review in the Imaging Section.  Brief recommendations include the following:  Clinical Impression   Limited MBS due to pt's decreased mentation.  Orally, puree and thin via tsp were expelled anteriorly from oral cavity despite max verbal/tactile cues given by SLP/Radiology tech with eventual removal from oral cavity. Mid-tongue press, bolus placed on alveolar ridge, various placements on tongue and this did not elicit bolus propulsion posteriorly and into pharynx to initiate the swallow.  Continue NPO until pt's mentation improved; repeat MBS prn to determine safest diet consistency; treatment charged due to patient participation being minimal during MBS attempt.  No report in imaging. ST will continue efforts.      Swallow Evaluation Recommendations       SLP Diet Recommendations: NPO       Medication Administration: Via alternative means               Oral Care Recommendations: Oral care QID        Elvina Sidle, M.S., CCC-SLP 01/04/2021,10:11 PM

## 2021-01-04 NOTE — Evaluation (Signed)
Physical Therapy Evaluation Patient Details Name: Desiree Pollard MRN: [id number] DOB: 1946/09/01 Today's Date: 01/04/2021   History of Present Illness  Desiree Pollard is a 75 y.o. female with medical history significant of strokes, DM2, HTN, dementia, CKD4.Per sister: pt with AMS since she visited on 1/17 which is new.  Does have left sided deficits and aphasia at baseline,patient presented 01/02/21 secondary to altered mental status. Also with hypernatremia and COVID-19 infection.   Patient  resides at [facility].  Clinical Impression  {Patient is Somnolent, noted right UE > left nonpurposeful maoving arms around. Only movement of both legs from deep stimuli, noted withdraws. Patient  Does not oppen eyes when spoken to.  Appears that patient has been at [facility] for several months. Unsure of ambulatory status. Last admission to [facility] , patient required mod assist for transfers.  Pt admitted with above diagnosis.  Pt currently with functional limitations due to the deficits listed below (see PT Problem List). Pt will benefit from skilled PT to increase their independence and safety with mobility to allow discharge to the venue listed below.  Will follow for trial for return to SNF.     Follow Up Recommendations SNF? Still receiving skilled PT at SNF.    Equipment Recommendations  None recommended by PT    Recommendations for Other Services       Precautions / Restrictions Precautions Precautions: Fall      Mobility  Bed Mobility               General bed mobility comments: requires total of 2 for rolling. Did not further assess due to somnolence    Transfers                    Ambulation/Gait                Stairs            Wheelchair Mobility    Modified Rankin (Stroke Patients Only)       Balance                                             Pertinent Vitals/Pain Pain Assessment: Faces Faces Pain Scale: Hurts little more Pain  Location: withdraws both legs to noxious stimuli, Pain Descriptors / Indicators: Grimacing    Home Living Family/patient expects to be discharged to:: Skilled nursing facility                 Additional Comments: unsure if ambulatory since last stroke    Prior Function Level of Independence: Needs assistance      ADL's / Homemaking Assistance Needed: assume total care at SNF.        Hand Dominance        Extremity/Trunk Assessment   Upper Extremity Assessment Upper Extremity Assessment:  (non purposeful movineg right more than left- lifts arms up in air.)    Lower Extremity Assessment Lower Extremity Assessment:  (withdraws to stimuli, no volitional movements noted.)    Cervical / Trunk Assessment Cervical / Trunk Assessment: Other exceptions Cervical / Trunk Exceptions: NT  Communication      Cognition Arousal/Alertness: Lethargic   Overall Cognitive Status: No family/caregiver present to determine baseline cognitive functioning  General Comments: does not follow, opened eyes x 1 with clod cloth to face.      General Comments      Exercises     Assessment/Plan    PT Assessment Patient needs continued PT services (trial most likely, from LTC SNF)  PT Problem List Decreased strength;Decreased mobility;Decreased cognition;Impaired sensation       PT Treatment Interventions Therapeutic activities;Therapeutic exercise;Functional mobility training    PT Goals (Current goals can be found in the Care Plan section)  Acute Rehab PT Goals PT Goal Formulation: Patient unable to participate in goal setting Time For Goal Achievement: 02/15/21 Potential to Achieve Goals: Poor    Frequency Min 1X/week   Barriers to discharge        Co-evaluation               AM-PAC PT "6 Clicks" Mobility  Outcome Measure Help needed turning from your back to your side while in a flat bed without using bedrails?:  Total Help needed moving from lying on your back to sitting on the side of a flat bed without using bedrails?: Total Help needed moving to and from a bed to a chair (including a wheelchair)?: Total Help needed standing up from a chair using your arms (e.g., wheelchair or bedside chair)?: Total Help needed to walk in hospital room?: Total Help needed climbing 3-5 steps with a railing? : Total 6 Click Score: 6    End of Session   Activity Tolerance: Patient limited by lethargy Patient left: in bed;with call bell/phone within reach;with bed alarm set Nurse Communication: Mobility status;Need for lift equipment PT Visit Diagnosis: Other symptoms and signs involving the nervous system (W80.321)    Time: 2248-2500 PT Time Calculation (min) (ACUTE ONLY): 17 min   Charges:   PT Evaluation $PT Eval Low Complexity: [place] PT Acute Rehabilitation Services Pager [phone] Office [phone]   Claretha Cooper 01/04/2021, 8:49 AM

## 2021-01-04 NOTE — Consult Note (Deleted)
Modified Barium Swallow Progress Note  Patient Details  Name: Desiree Pollard MRN: [id number] Date of Birth: Jun 16, 1946  Today's Date: 01/04/2021  Modified Barium Swallow completed.  Full report located under Chart Review in the Imaging Section.  Brief recommendations include the following:  Clinical Impression  Limited MBS due to pt's decreased mentation.  Orally, puree and thin via tsp were expelled anteriorly despte max verbal/tactile cues given by SLP/Radiology tech.  Mid-tongue press, bolus placed on alveolar ridge, various placements on tongue did not elicit bolus propulsion posteriorly   Swallow Evaluation Recommendations       SLP Diet Recommendations: NPO       Medication Administration: Via alternative means               Oral Care Recommendations: Oral care QID        Elvina Sidle, M.S., CCC-SLLP 01/04/2021,10:04 PM

## 2021-01-04 NOTE — Progress Notes (Addendum)
PROGRESS NOTE    Desiree Pollard  WJX:[id number] DOB: 1946-06-30 DOA: 01/02/2021 PCP: [name], FNP   Brief Narrative: Desiree Pollard is a 75 y.o. female with a history of dementia, CKD stage IV, diabetes mellitus, CVA. Patient presented secondary to altered mental status and found to have hypernatremia. Started on IV fluids. Also found to have COVID-19 and started on Remdesivir. No evidence of pneumonia.   Assessment & Plan:   Principal Problem:   Acute metabolic encephalopathy Active Problems:   Insulin dependent type 2 diabetes mellitus (HCC)   Hypertension   History of stroke   CKD (chronic kidney disease), stage IV ([place])   COVID-19 virus infection   Hypernatremia   Acute metabolic encephalopathy Possibly secondary to hypernatremia and dehydration started on IV fluids. Urine culture obtained on admission however urinalysis does not suggest infection. Mentation seems baseline.  Hypernatremia Likely secondary to poor oral intake. Sodium of 154 on admission with slight trend down. Started on 1/2 NS IV fluids -Switch to D5 water -Serial BMP  COVID-19 infection No evidence of pneumonia. Asymptomatic. Started on Remdesivir on admission. CRP normal.  Leukopenia Secondary to COVID-19 infection with lymphocytopenia. Normal ANC.  AKI on CKD stage IV Baseline creatinine of about 2.6. Creatinine of 2.98 on admission and currently stable. Appears likely secondary to hypovolemia and poor oral intake. -IV fluids  Hypothermia Unknown etiology. Procalcitonin undetectable. No leukocytosis. No signs concerning for infection/sepsis. Urine culture with no growth -Continue Bair hugger until core temperature is closer to normal -Follow up blood cultures  Anemia of chronic disease Likely secondary to kidney disease. Stable.  Thrombocytopenia  No evidence of bleeding. Likely related to infection. Patient is also on aspirin and Plavix for history of stroke.  Stable.  History of stroke On aspirin and Plavix as an outpatient -Continue aspirin/Plavix   DVT prophylaxis: SCDs Code Status:   Code Status: Full Code Family Communication: Sister on telephone with no response Disposition Plan: Discharge back to SNF in several days pending improvement of sodium, evaluation for swallowing   Consultants:   None  Procedures:   None  Antimicrobials:  Remdesivir    Subjective: No issues overnight.   Objective: Vitals:   01/04/21 1100 01/04/21 1200 01/04/21 1230 01/04/21 1400  BP:    (!) 169/85  Pulse: (!) 59 (!) 59  68  Resp: (!) 22 18  19   Temp:   97.8 F (36.6 C)   TempSrc:   Axillary   SpO2: 97% 98%  97%  Weight:      Height:        Intake/Output Summary (Last 24 hours) at 01/04/2021 1558 Last data filed at 01/04/2021 0600 Gross per 24 hour  Intake 1423.68 ml  Output -  Net 1423.68 ml   Filed Weights   01/02/21 1919 01/03/21 2209  Weight: 68 kg 78.6 kg    Examination:  General exam: Appears calm and comfortable Respiratory system: Clear to auscultation. Respiratory effort normal. Cardiovascular system: S1 & S2 heard, RRR. No murmurs, rubs, gallops or clicks. Gastrointestinal system: Abdomen is nondistended, soft and nontender. No organomegaly or masses felt. Normal bowel sounds heard. Central nervous system: Lethargic. Arouses with calling her name and tactile stimulation but falls back asleep. Musculoskeletal: No edema. No calf tenderness Skin: No cyanosis. No rashes Psychiatry: Judgement and insight appear impaired.    Data Reviewed: I have personally reviewed following labs and imaging studies  CBC Lab Results  Component Value Date   WBC 2.3 (L) 01/04/2021  RBC 2.76 (L) 01/04/2021   HGB 7.8 (L) 01/04/2021   HCT 27.2 (L) 01/04/2021   MCV 98.6 01/04/2021   MCH 28.3 01/04/2021   PLT 70 (L) 01/04/2021   MCHC 28.7 (L) 01/04/2021   RDW 15.6 (H) 01/04/2021   LYMPHSABS 0.4 (L) 01/04/2021   MONOABS 0.2  01/04/2021   EOSABS 0.0 01/04/2021   BASOSABS 0.0 63/12/6008     Last metabolic panel Lab Results  Component Value Date   NA 148 (H) 01/04/2021   K 3.7 01/04/2021   CL 119 (H) 01/04/2021   CO2 21 (L) 01/04/2021   BUN 46 (H) 01/04/2021   CREATININE 3.13 (H) 01/04/2021   GLUCOSE 89 01/04/2021   GFRNONAA 15 (L) 01/04/2021   GFRAA 20 (L) 08/07/2020   CALCIUM 9.0 01/04/2021   PHOS 3.2 10/21/2020   PROT 6.2 (L) 01/04/2021   ALBUMIN 2.7 (L) 01/04/2021   LABGLOB 3.4 08/07/2020   AGRATIO 1.3 08/07/2020   BILITOT 0.4 01/04/2021   ALKPHOS 136 (H) 01/04/2021   AST 48 (H) 01/04/2021   ALT 74 (H) 01/04/2021   ANIONGAP 8 01/04/2021    CBG (last 3)  Recent Labs    01/04/21 0324 01/04/21 0904 01/04/21 1236  GLUCAP 89 90 83     GFR: Estimated Creatinine Clearance: 16.5 mL/min (A) (by C-G formula based on SCr of 3.13 mg/dL (H)).  Coagulation Profile: No results for input(s): INR, PROTIME in the last 168 hours.  Recent Results (from the past 240 hour(s))  Urine culture     Status: None   Collection Time: 01/02/21  8:53 PM   Specimen: Urine, Random  Result Value Ref Range Status   Specimen Description   Final    URINE, RANDOM Performed at [facility] [address] [place], [place] [address]    Special Requests   Final    NONE Performed at [facility], [name] [address], [place], [place] [address]    Culture   Final    NO GROWTH Performed at [facility], [place] [address], [place], [place] [address]    Report Status 01/04/2021 FINAL  Final  SARS Coronavirus 2 by RT PCR (hospital order, performed in [facility] hospital lab) Nasopharyngeal Nasopharyngeal Swab     Status: Abnormal   Collection Time: 01/02/21  9:00 PM   Specimen: Nasopharyngeal Swab  Result Value Ref Range Status   SARS Coronavirus 2 POSITIVE (A) NEGATIVE Final    Comment: CRITICAL RESULT CALLED TO, READ BACK BY AND VERIFIED WITH: DR Regenia Skeeter AT 2245 01/02/21  CRUICKSHANK A (NOTE) SARS-CoV-2 target nucleic acids are DETECTED  SARS-CoV-2 RNA is generally detectable in upper respiratory specimens  during the acute phase of infection.  Positive results are indicative  of the presence of the identified virus, but do not rule out bacterial infection or co-infection with other pathogens not detected by the test.  Clinical correlation with patient history and  other diagnostic information is necessary to determine patient infection status.  The expected result is negative.  Fact Sheet for Patients:   StrictlyIdeas.no   Fact Sheet for Healthcare Providers:   BankingDealers.co.za    This test is not yet approved or cleared by the [place] FDA and  has been authorized for detection and/or diagnosis of SARS-CoV-2 by FDA under an Emergency Use Authorization (EUA).  This EUA will remain in effect  (meaning this test can be used) for the duration of  the COVID-19 declaration under Section 564(b)(1) of  the Act, 21 U.S.C. section 360-bbb-3(b)(1), unless the authorization is terminated or revoked sooner.  Performed at [facility], [place] [address], [place], [place] [address]   MRSA PCR Screening     Status: None   Collection Time: 01/03/21 10:02 PM   Specimen: Nasal Mucosa; Nasopharyngeal  Result Value Ref Range Status   MRSA by PCR NEGATIVE NEGATIVE Final    Comment:        The GeneXpert MRSA Assay (FDA approved for NASAL specimens only), is one component of a comprehensive MRSA colonization surveillance program. It is not intended to diagnose MRSA infection nor to guide or monitor treatment for MRSA infections. Performed at [facility], [place] [address] [place], [place] [address]   Culture, blood (routine x 2)     Status: None (Preliminary result)   Collection Time: 01/03/21 10:15 PM   Specimen: BLOOD  Result Value Ref Range Status   Specimen  Description   Final    BLOOD Performed at [facility] [address], [place], [place] [address]    Special Requests   Final    BOTTLES DRAWN AEROBIC ONLY Performed at [facility] [address], [place], [place] [address]    Culture   Final    NO GROWTH < 12 HOURS Performed at [facility] [address], [place], [place] [address]    Report Status PENDING  Incomplete  Culture, blood (routine x 2)     Status: None (Preliminary result)   Collection Time: 01/03/21 10:24 PM   Specimen: BLOOD RIGHT HAND  Result Value Ref Range Status   Specimen Description   Final    BLOOD RIGHT HAND Performed at [facility] [address], [place], [place] [address]    Special Requests   Final    BOTTLES DRAWN AEROBIC AND ANAEROBIC Blood Culture adequate volume Performed at [facility] [address] [place], [place] [address]    Culture   Final    NO GROWTH < 12 HOURS Performed at [facility] [address], [place], [place] [address]    Report Status PENDING  Incomplete        Radiology Studies: CT Head Wo Contrast  Result Date: 01/02/2021 CLINICAL DATA:  Mental status change, unknown cause. had 2 strokes, most recent last October. 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 Pt has weakness at baseline. EXAM: CT HEAD WITHOUT CONTRAST TECHNIQUE: Contiguous axial images were obtained from the base of the skull through the vertex without intravenous contrast. COMPARISON:  CT head 11/24/2020 FINDINGS: Brain: Cerebral ventricle sizes are concordant with the degree of cerebral volume loss. Patchy and confluent areas of decreased attenuation are noted throughout the deep and periventricular white matter of the cerebral hemispheres bilaterally, compatible with chronic microvascular ischemic disease. Bilateral, right greater than left cerebellar encephalomalacia. Prior right basal ganglia lacunar  infarction. No evidence of large-territorial acute infarction. No parenchymal hemorrhage. No mass lesion. No extra-axial collection. No mass effect or midline shift. No hydrocephalus. Basilar cisterns are patent. Vascular: No hyperdense vessel. Atherosclerotic calcifications are present within the cavernous internal carotid arteries. Skull: No acute fracture or focal lesion. Sinuses/Orbits: Paranasal sinuses and mastoid air cells are clear. The orbits are unremarkable. Other: None. IMPRESSION: No acute intracranial abnormality. Electronically Signed   By: Iven Finn M.D.   On: 01/02/2021 20:46   DG CHEST PORT 1 VIEW  Result Date: 01/04/2021 CLINICAL DATA:  COVID-19.  Shortness of breath. EXAM: PORTABLE CHEST 1 VIEW COMPARISON:  Chest radiograph 01/02/2021. FINDINGS: Multi lead pacer apparatus overlies the right hemithorax. Stable enlarged cardiac and mediastinal contours. Similar-appearing diffuse bilateral airspace opacities. Small bilateral pleural effusions. No pneumothorax. Thoracic spine degenerative changes. Surgical clips left chest wall. IMPRESSION: Similar-appearing diffuse bilateral airspace opacities which may represent edema or infection. Small bilateral pleural effusions. Electronically Signed   By: Lovey Newcomer M.D.   On: 01/04/2021 09:03   DG Chest Portable 1 View  Result Date: 01/02/2021 CLINICAL DATA:  Change in mental status EXAM: PORTABLE CHEST 1 VIEW COMPARISON:  November 24, 2020 FINDINGS: The heart size and mediastinal contours are mildly enlarged. There is hazy interstitial opacities seen throughout both lungs. Small bilateral pleural effusions are seen. The visualized skeletal structures are unremarkable. IMPRESSION: Diffuse interstitial opacities which may be due to interstitial edema and/or infectious etiology Small bilateral pleural effusions Electronically Signed   By: Prudencio Pair M.D.   On: 01/02/2021 20:26        Scheduled Meds: . atorvastatin  80 mg Oral Daily  .  carvedilol  25 mg Oral BID WC  . Chlorhexidine Gluconate Cloth  6 each Topical Q0600  . clopidogrel  75 mg Oral Daily  . dorzolamide-timolol  1 drop Both Eyes BID  . ferrous sulfate  325 mg Oral QODAY  . hydrALAZINE  25 mg Oral BID  . insulin aspart  0-9 Units Subcutaneous TID WC  . pantoprazole  40 mg Oral Daily   Continuous Infusions: . dextrose 75 mL/hr at 01/04/21 0800     LOS: 1 day     Cordelia Poche, MD [facility] 01/04/2021, 3:58 PM  If 7PM-7AM, please contact night-coverage [email or web address]

## 2021-01-05 ENCOUNTER — Inpatient Hospital Stay (HOSPITAL_COMMUNITY): Payer: Medicare HMO

## 2021-01-05 DIAGNOSIS — E870 Hyperosmolality and hypernatremia: Secondary | ICD-10-CM | POA: Diagnosis not present

## 2021-01-05 DIAGNOSIS — U071 COVID-19: Secondary | ICD-10-CM | POA: Diagnosis not present

## 2021-01-05 LAB — COMPREHENSIVE METABOLIC PANEL
ALT: 62 U/L — ABNORMAL HIGH (ref 0–44)
AST: 40 U/L (ref 15–41)
Albumin: 2.6 g/dL — ABNORMAL LOW (ref 3.5–5.0)
Alkaline Phosphatase: 132 U/L — ABNORMAL HIGH (ref 38–126)
Anion gap: 7 (ref 5–15)
BUN: 44 mg/dL — ABNORMAL HIGH (ref 8–23)
CO2: 21 mmol/L — ABNORMAL LOW (ref 22–32)
Calcium: 9 mg/dL (ref 8.9–10.3)
Chloride: 120 mmol/L — ABNORMAL HIGH (ref 98–111)
Creatinine, Ser: 3.14 mg/dL — ABNORMAL HIGH (ref 0.44–1.00)
GFR, Estimated: 15 mL/min — ABNORMAL LOW (ref >60)
Glucose, Bld: 86 mg/dL (ref 70–99)
Potassium: 3.5 mmol/L (ref 3.5–5.1)
Sodium: 148 mmol/L — ABNORMAL HIGH (ref 135–145)
Total Bilirubin: 0.5 mg/dL (ref 0.3–1.2)
Total Protein: 6 g/dL — ABNORMAL LOW (ref 6.5–8.1)

## 2021-01-05 LAB — CBC WITH DIFFERENTIAL/PLATELET
Abs Immature Granulocytes: 0.06 10*3/uL (ref 0.00–0.07)
Basophils Absolute: 0 10*3/uL (ref 0.0–0.1)
Basophils Relative: 1 %
Eosinophils Absolute: 0 10*3/uL (ref 0.0–0.5)
Eosinophils Relative: 1 %
HCT: 26.6 % — ABNORMAL LOW (ref 36.0–46.0)
Hemoglobin: 7.9 g/dL — ABNORMAL LOW (ref 12.0–15.0)
Immature Granulocytes: 2 %
Lymphocytes Relative: 14 %
Lymphs Abs: 0.5 10*3/uL — ABNORMAL LOW (ref 0.7–4.0)
MCH: 28.4 pg (ref 26.0–34.0)
MCHC: 29.7 g/dL — ABNORMAL LOW (ref 30.0–36.0)
MCV: 95.7 fL (ref 80.0–100.0)
Monocytes Absolute: 0.3 10*3/uL (ref 0.1–1.0)
Monocytes Relative: 9 %
Neutro Abs: 2.9 10*3/uL (ref 1.7–7.7)
Neutrophils Relative %: 73 %
Platelets: 78 10*3/uL — ABNORMAL LOW (ref 150–400)
RBC: 2.78 MIL/uL — ABNORMAL LOW (ref 3.87–5.11)
RDW: 15.6 % — ABNORMAL HIGH (ref 11.5–15.5)
WBC: 3.9 10*3/uL — ABNORMAL LOW (ref 4.0–10.5)
nRBC: 3.4 % — ABNORMAL HIGH (ref 0.0–0.2)

## 2021-01-05 LAB — BASIC METABOLIC PANEL
Anion gap: 10 (ref 5–15)
Anion gap: 9 (ref 5–15)
BUN: 44 mg/dL — ABNORMAL HIGH (ref 8–23)
BUN: 46 mg/dL — ABNORMAL HIGH (ref 8–23)
CO2: 19 mmol/L — ABNORMAL LOW (ref 22–32)
CO2: 21 mmol/L — ABNORMAL LOW (ref 22–32)
Calcium: 9.1 mg/dL (ref 8.9–10.3)
Calcium: 9.1 mg/dL (ref 8.9–10.3)
Chloride: 120 mmol/L — ABNORMAL HIGH (ref 98–111)
Chloride: 120 mmol/L — ABNORMAL HIGH (ref 98–111)
Creatinine, Ser: 3.33 mg/dL — ABNORMAL HIGH (ref 0.44–1.00)
Creatinine, Ser: 3.47 mg/dL — ABNORMAL HIGH (ref 0.44–1.00)
GFR, Estimated: 14 mL/min — ABNORMAL LOW (ref >60)
GFR, Estimated: 13 mL/min — ABNORMAL LOW (ref >60)
Glucose, Bld: 84 mg/dL (ref 70–99)
Glucose, Bld: 106 mg/dL — ABNORMAL HIGH (ref 70–99)
Potassium: 3.7 mmol/L (ref 3.5–5.1)
Potassium: 3.4 mmol/L — ABNORMAL LOW (ref 3.5–5.1)
Sodium: 149 mmol/L — ABNORMAL HIGH (ref 135–145)
Sodium: 150 mmol/L — ABNORMAL HIGH (ref 135–145)

## 2021-01-05 LAB — TROPONIN I (HIGH SENSITIVITY): Troponin I (High Sensitivity): 25 ng/L — ABNORMAL HIGH (ref <18)

## 2021-01-05 LAB — GLUCOSE, CAPILLARY
Glucose-Capillary: 68 mg/dL — ABNORMAL LOW (ref 70–99)
Glucose-Capillary: 71 mg/dL (ref 70–99)
Glucose-Capillary: 72 mg/dL (ref 70–99)
Glucose-Capillary: 74 mg/dL (ref 70–99)
Glucose-Capillary: 77 mg/dL (ref 70–99)
Glucose-Capillary: 79 mg/dL (ref 70–99)
Glucose-Capillary: 85 mg/dL (ref 70–99)
Glucose-Capillary: 87 mg/dL (ref 70–99)

## 2021-01-05 LAB — AMMONIA: Ammonia: 36 umol/L — ABNORMAL HIGH (ref 9–35)

## 2021-01-05 LAB — PROCALCITONIN: Procalcitonin: <0.1 ng/mL

## 2021-01-05 LAB — C-REACTIVE PROTEIN: CRP: 1 mg/dL — ABNORMAL HIGH (ref <1.0)

## 2021-01-05 LAB — TSH: TSH: 3.419 u[IU]/mL (ref 0.350–4.500)

## 2021-01-05 LAB — D-DIMER, QUANTITATIVE: D-Dimer, Quant: 1.26 ug/mL-FEU — ABNORMAL HIGH (ref 0.00–0.50)

## 2021-01-05 LAB — LACTIC ACID, PLASMA: Lactic Acid, Venous: 0.7 mmol/L (ref 0.5–1.9)

## 2021-01-05 IMAGING — DX DG CHEST 1V PORT
1 series · 1 of 1 positions shown · non-contrast
Comparison: [DATE]

CLINICAL DATA: [place]

EXAM:
PORTABLE CHEST 1 VIEW

[chest ap]
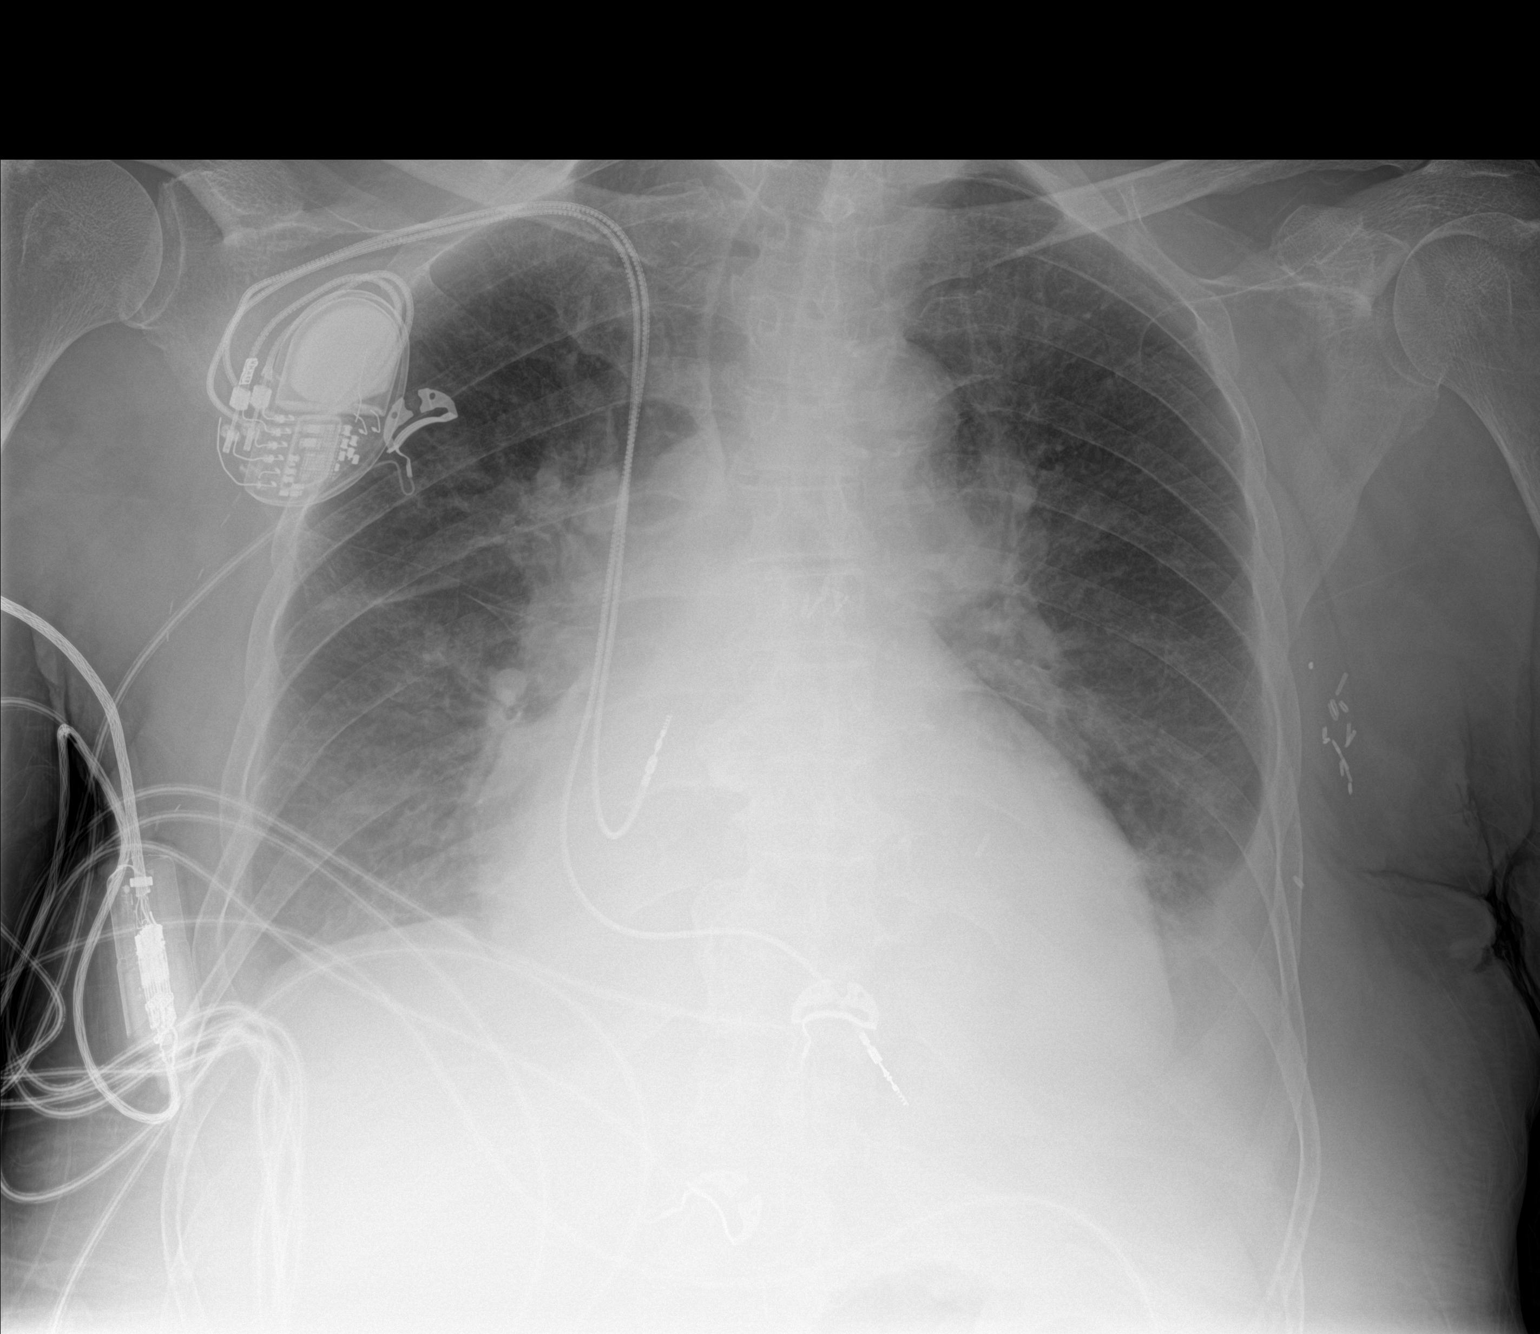

[1 of 1 positions shown; findings below may reference images not displayed]

FINDINGS: Cardiomegaly with increased interstitial markings, favoring mild
interstitial edema. Small bilateral pleural effusions, left greater
than right. This appearance is similar to the prior. No
pneumothorax.

Right subclavian pacemaker.
IMPRESSION: Cardiomegaly with mild interstitial edema and small bilateral
pleural effusions, similar to the prior.

## 2021-01-05 MED ORDER — DEXTROSE 50 % IV SOLN
12.5000 g | INTRAVENOUS | Status: AC
  Administered 2021-01-05: 12.5 g via INTRAVENOUS
  Filled 2021-01-05: qty 50

## 2021-01-05 MED ORDER — FUROSEMIDE 10 MG/ML IJ SOLN
40.0000 mg | Freq: Two times a day (BID) | INTRAMUSCULAR | Status: AC
  Administered 2021-01-05 – 2021-01-06 (×2): 40 mg via INTRAVENOUS
  Filled 2021-01-05 (×2): qty 4

## 2021-01-05 MED ORDER — SODIUM CHLORIDE 0.9 % IV SOLN
2.0000 g | Freq: Every day | INTRAVENOUS | Status: DC
  Administered 2021-01-05 – 2021-01-06 (×2): 2 g via INTRAVENOUS
  Filled 2021-01-05: qty 2

## 2021-01-05 MED ORDER — FUROSEMIDE 10 MG/ML IJ SOLN
40.0000 mg | Freq: Once | INTRAMUSCULAR | Status: AC
  Administered 2021-01-05: 40 mg via INTRAVENOUS
  Filled 2021-01-05: qty 4

## 2021-01-05 MED ORDER — VANCOMYCIN HCL IN DEXTROSE 1-5 GM/200ML-% IV SOLN: 1000.0000 mg | INTRAVENOUS | Status: DC

## 2021-01-05 MED ORDER — INSULIN ASPART 100 UNIT/ML ~~LOC~~ SOLN
0.0000 [IU] | SUBCUTANEOUS | Status: DC
  Administered 2021-01-07: 1 [IU] via SUBCUTANEOUS
  Administered 2021-01-07 (×2): 2 [IU] via SUBCUTANEOUS
  Administered 2021-01-07 – 2021-01-08 (×3): 1 [IU] via SUBCUTANEOUS
  Administered 2021-01-08 (×2): 2 [IU] via SUBCUTANEOUS
  Administered 2021-01-08 – 2021-01-09 (×3): 1 [IU] via SUBCUTANEOUS
  Administered 2021-01-10 (×2): 2 [IU] via SUBCUTANEOUS
  Administered 2021-01-10 (×3): 1 [IU] via SUBCUTANEOUS
  Administered 2021-01-10: 2 [IU] via SUBCUTANEOUS
  Administered 2021-01-11 (×2): 3 [IU] via SUBCUTANEOUS
  Administered 2021-01-11 – 2021-01-12 (×5): 2 [IU] via SUBCUTANEOUS
  Administered 2021-01-12: 3 [IU] via SUBCUTANEOUS
  Administered 2021-01-12 (×2): 2 [IU] via SUBCUTANEOUS
  Administered 2021-01-12 – 2021-01-13 (×3): 3 [IU] via SUBCUTANEOUS
  Administered 2021-01-13 (×2): 5 [IU] via SUBCUTANEOUS
  Administered 2021-01-13 (×3): 3 [IU] via SUBCUTANEOUS
  Administered 2021-01-14: 5 [IU] via SUBCUTANEOUS
  Administered 2021-01-14: 1 [IU] via SUBCUTANEOUS
  Administered 2021-01-14: 2 [IU] via SUBCUTANEOUS
  Administered 2021-01-15 – 2021-01-16 (×6): 1 [IU] via SUBCUTANEOUS
  Administered 2021-01-17 (×2): 2 [IU] via SUBCUTANEOUS
  Administered 2021-01-17: 3 [IU] via SUBCUTANEOUS
  Administered 2021-01-17 (×2): 2 [IU] via SUBCUTANEOUS
  Administered 2021-01-17: 3 [IU] via SUBCUTANEOUS
  Administered 2021-01-17: 1 [IU] via SUBCUTANEOUS
  Administered 2021-01-18: 2 [IU] via SUBCUTANEOUS
  Administered 2021-01-18 (×3): 3 [IU] via SUBCUTANEOUS
  Administered 2021-01-18 (×2): 2 [IU] via SUBCUTANEOUS
  Administered 2021-01-19 (×2): 3 [IU] via SUBCUTANEOUS
  Administered 2021-01-19: 5 [IU] via SUBCUTANEOUS
  Administered 2021-01-19: 3 [IU] via SUBCUTANEOUS
  Administered 2021-01-19: 5 [IU] via SUBCUTANEOUS
  Administered 2021-01-20: 3 [IU] via SUBCUTANEOUS
  Administered 2021-01-20 (×3): 2 [IU] via SUBCUTANEOUS
  Administered 2021-01-20 – 2021-01-21 (×4): 3 [IU] via SUBCUTANEOUS
  Administered 2021-01-21: 1 [IU] via SUBCUTANEOUS
  Administered 2021-01-21: 3 [IU] via SUBCUTANEOUS
  Administered 2021-01-21: 2 [IU] via SUBCUTANEOUS
  Administered 2021-01-22 (×3): 3 [IU] via SUBCUTANEOUS
  Administered 2021-01-22 (×2): 2 [IU] via SUBCUTANEOUS
  Administered 2021-01-23 – 2021-01-27 (×9): 1 [IU] via SUBCUTANEOUS
  Administered 2021-01-27: 2 [IU] via SUBCUTANEOUS
  Administered 2021-01-27 – 2021-01-28 (×4): 1 [IU] via SUBCUTANEOUS
  Administered 2021-01-28 (×2): 2 [IU] via SUBCUTANEOUS
  Administered 2021-01-28 – 2021-01-30 (×6): 1 [IU] via SUBCUTANEOUS
  Administered 2021-01-30: 2 [IU] via SUBCUTANEOUS
  Administered 2021-01-30 – 2021-01-31 (×3): 1 [IU] via SUBCUTANEOUS
  Administered 2021-01-31: 2 [IU] via SUBCUTANEOUS
  Administered 2021-01-31: 1 [IU] via SUBCUTANEOUS
  Administered 2021-02-01 (×5): 2 [IU] via SUBCUTANEOUS
  Administered 2021-02-01: 1 [IU] via SUBCUTANEOUS
  Administered 2021-02-01 – 2021-02-02 (×3): 2 [IU] via SUBCUTANEOUS
  Administered 2021-02-02 – 2021-02-03 (×4): 1 [IU] via SUBCUTANEOUS
  Administered 2021-02-03: 2 [IU] via SUBCUTANEOUS
  Administered 2021-02-03 (×2): 1 [IU] via SUBCUTANEOUS
  Administered 2021-02-04: 2 [IU] via SUBCUTANEOUS
  Administered 2021-02-04: 1 [IU] via SUBCUTANEOUS
  Administered 2021-02-04: 2 [IU] via SUBCUTANEOUS
  Administered 2021-02-04: 1 [IU] via SUBCUTANEOUS
  Administered 2021-02-04 – 2021-02-05 (×3): 2 [IU] via SUBCUTANEOUS
  Administered 2021-02-05: 1 [IU] via SUBCUTANEOUS
  Administered 2021-02-05 (×3): 2 [IU] via SUBCUTANEOUS
  Administered 2021-02-05: 1 [IU] via SUBCUTANEOUS
  Administered 2021-02-06: 2 [IU] via SUBCUTANEOUS
  Administered 2021-02-06: 1 [IU] via SUBCUTANEOUS
  Administered 2021-02-06: 2 [IU] via SUBCUTANEOUS
  Administered 2021-02-07: 3 [IU] via SUBCUTANEOUS
  Administered 2021-02-07 (×3): 2 [IU] via SUBCUTANEOUS
  Administered 2021-02-07: 1 [IU] via SUBCUTANEOUS
  Administered 2021-02-08 (×2): 2 [IU] via SUBCUTANEOUS
  Administered 2021-02-08: 3 [IU] via SUBCUTANEOUS
  Administered 2021-02-08: 1 [IU] via SUBCUTANEOUS
  Administered 2021-02-09: 2 [IU] via SUBCUTANEOUS
  Administered 2021-02-09 (×3): 1 [IU] via SUBCUTANEOUS
  Administered 2021-02-09 – 2021-02-10 (×4): 2 [IU] via SUBCUTANEOUS
  Administered 2021-02-10: 1 [IU] via SUBCUTANEOUS
  Administered 2021-02-10 (×2): 2 [IU] via SUBCUTANEOUS
  Administered 2021-02-11 – 2021-02-15 (×8): 1 [IU] via SUBCUTANEOUS
  Administered 2021-02-15 – 2021-02-16 (×5): 2 [IU] via SUBCUTANEOUS
  Administered 2021-02-16 (×2): 1 [IU] via SUBCUTANEOUS
  Administered 2021-02-17 (×3): 2 [IU] via SUBCUTANEOUS
  Administered 2021-02-17: 1 [IU] via SUBCUTANEOUS
  Administered 2021-02-17 (×2): 2 [IU] via SUBCUTANEOUS
  Administered 2021-02-18 (×4): 1 [IU] via SUBCUTANEOUS
  Administered 2021-02-19 (×3): 2 [IU] via SUBCUTANEOUS

## 2021-01-05 MED ORDER — VANCOMYCIN HCL IN DEXTROSE 1-5 GM/200ML-% IV SOLN
1000.0000 mg | INTRAVENOUS | Status: DC
  Administered 2021-01-05: 1000 mg via INTRAVENOUS
  Filled 2021-01-05: qty 200

## 2021-01-05 NOTE — Progress Notes (Addendum)
PROGRESS NOTE    Desiree Pollard  XFG:[id number] DOB: 29-Feb-1946 DOA: 01/02/2021 PCP: [name], FNP   Brief Narrative: Desiree Pollard is a 75 y.o. female with a history of dementia, CKD stage IV, diabetes mellitus, CVA. Patient presented secondary to altered mental status and found to have hypernatremia. Started on IV fluids. Also found to have COVID-19 and started on Remdesivir. No evidence of pneumonia.   Assessment & Plan:   Principal Problem:   Acute metabolic encephalopathy Active Problems:   Insulin dependent type 2 diabetes mellitus (HCC)   Hypertension   History of stroke   CKD (chronic kidney disease), stage IV ([place])   COVID-19 virus infection   Hypernatremia   Acute metabolic encephalopathy Possibly secondary to hypernatremia and dehydration started on IV fluids. Urine culture obtained on admission however urinalysis does not suggest infection. Mentation worsened again. She has some mild hypoglycemia this morning. Prior CT head without abnormalities. No seizure history. With COVID-19 infection, it is possible this could be the driver but will continue to search for alternate causes. -Ammonia, troponin  Hypernatremia Likely secondary to poor oral intake. Sodium of 154 on admission with slight trend down. Started on 1/2 NS IV fluids -Discontinue IV fluids secondary to worsening edema -Serial BMP  COVID-19 infection No evidence of pneumonia. Started on Remdesivir on admission. CRP normal. Febrile today.  Edema In setting of IV fluids for treatment of hypernatremia. Patient has a history of diastolic heart failure. BNP is elevated. Prior chest x-ray with infiltrate vs edema. -Lasix IV diuresis -Daily weights  Chronic diastolic heart failure Seen on recent Transthoracic Echocardiogram from 10/2020. EF of 60-65%.  Leukopenia Secondary to COVID-19 infection with lymphocytopenia. Normal ANC.  AKI on CKD stage IV Baseline creatinine of about 2.6.  Creatinine of 2.98 on admission and currently stable. Appears likely secondary to hypovolemia and poor oral intake. -IV fluids  Hypothermia Unknown etiology. COVID-19 infection. Procalcitonin undetectable. No leukocytosis. No signs concerning for infection/sepsis. Urine culture with no growth. Resolved. -Follow up blood cultures  Fever No specific source except known COVID-19 infection. -Continue to follow culture results -Vancomycin/Cefepime empirically  Anemia of chronic disease Likely secondary to kidney disease. Stable.  Thrombocytopenia  No evidence of bleeding. Likely related to infection. Patient is also on aspirin and Plavix for history of stroke. Stable.  History of stroke On aspirin and Plavix as an outpatient -Continue aspirin/Plavix   DVT prophylaxis: SCDs Code Status:   Code Status: Full Code Family Communication: Sister via telephone. No response. Voicemail full. Disposition Plan: Discharge back to SNF in several days pending improvement of sodium, evaluation for swallowing, improvement of mentation and workup for possible infection.   Consultants:   None  Procedures:   None  Antimicrobials:  Remdesivir    Subjective: No issues overnight.   Objective: Vitals:   01/05/21 0900 01/05/21 1000 01/05/21 1100 01/05/21 1200  BP:  (!) 166/72  (!) 171/68  Pulse: 75 60 63   Resp: 19 (!) 21 15   Temp:  98.2 F (36.8 C)    TempSrc:  Axillary    SpO2: 97% 98% 97%   Weight:      Height:        Intake/Output Summary (Last 24 hours) at 01/05/2021 1226 Last data filed at 01/05/2021 0800 Gross per 24 hour  Intake 1875.41 ml  Output 400 ml  Net 1475.41 ml   Filed Weights   01/02/21 1919 01/03/21 2209  Weight: 68 kg 78.6 kg    Examination:  General exam: Appears calm and comfortable Respiratory system: Clear to auscultation. Respiratory effort normal. Cardiovascular system: S1 & S2 heard, RRR. No murmurs, rubs, gallops or clicks. Gastrointestinal  system: Abdomen is nondistended, soft and nontender. No organomegaly or masses felt. Normal bowel sounds heard. Central nervous system: Lethargic. Arouses with calling her name and tactile stimulation but falls back asleep. Musculoskeletal: No edema. No calf tenderness Skin: No cyanosis. No rashes Psychiatry: Judgement and insight appear impaired.    Data Reviewed: I have personally reviewed following labs and imaging studies  CBC Lab Results  Component Value Date   WBC 3.9 (L) 01/05/2021   RBC 2.78 (L) 01/05/2021   HGB 7.9 (L) 01/05/2021   HCT 26.6 (L) 01/05/2021   MCV 95.7 01/05/2021   MCH 28.4 01/05/2021   PLT 78 (L) 01/05/2021   MCHC 29.7 (L) 01/05/2021   RDW 15.6 (H) 01/05/2021   LYMPHSABS 0.5 (L) 01/05/2021   MONOABS 0.3 01/05/2021   EOSABS 0.0 01/05/2021   BASOSABS 0.0 50/53/9767     Last metabolic panel Lab Results  Component Value Date   NA 148 (H) 01/05/2021   K 3.5 01/05/2021   CL 120 (H) 01/05/2021   CO2 21 (L) 01/05/2021   BUN 44 (H) 01/05/2021   CREATININE 3.14 (H) 01/05/2021   GLUCOSE 86 01/05/2021   GFRNONAA 15 (L) 01/05/2021   GFRAA 20 (L) 08/07/2020   CALCIUM 9.0 01/05/2021   PHOS 3.2 10/21/2020   PROT 6.0 (L) 01/05/2021   ALBUMIN 2.6 (L) 01/05/2021   LABGLOB 3.4 08/07/2020   AGRATIO 1.3 08/07/2020   BILITOT 0.5 01/05/2021   ALKPHOS 132 (H) 01/05/2021   AST 40 01/05/2021   ALT 62 (H) 01/05/2021   ANIONGAP 7 01/05/2021    CBG (last 3)  Recent Labs    01/05/21 0407 01/05/21 0802 01/05/21 1152  GLUCAP 85 72 68*     GFR: Estimated Creatinine Clearance: 16.5 mL/min (A) (by C-G formula based on SCr of 3.14 mg/dL (H)).  Coagulation Profile: No results for input(s): INR, PROTIME in the last 168 hours.  Recent Results (from the past 240 hour(s))  Urine culture     Status: None   Collection Time: 01/02/21  8:53 PM   Specimen: Urine, Random  Result Value Ref Range Status   Specimen Description   Final    URINE, RANDOM Performed at  [facility] [address] [place], [place] [address]    Special Requests   Final    NONE Performed at [facility], [place] [address], [place], [place] [address]    Culture   Final    NO GROWTH Performed at [facility], [place] [address], [place], [place] [address]    Report Status 01/04/2021 FINAL  Final  SARS Coronavirus 2 by RT PCR (hospital order, performed in [facility] hospital lab) Nasopharyngeal Nasopharyngeal Swab     Status: Abnormal   Collection Time: 01/02/21  9:00 PM   Specimen: Nasopharyngeal Swab  Result Value Ref Range Status   SARS Coronavirus 2 POSITIVE (A) NEGATIVE Final    Comment: CRITICAL RESULT CALLED TO, READ BACK BY AND VERIFIED WITH: DR Regenia Skeeter AT 2245 01/02/21 CRUICKSHANK A (NOTE) SARS-CoV-2 target nucleic acids are DETECTED  SARS-CoV-2 RNA is generally detectable in upper respiratory specimens  during the acute phase of infection.  Positive results are indicative  of the presence of the identified virus, but do not rule out bacterial infection or co-infection with other pathogens not detected  by the test.  Clinical correlation with patient history and  other diagnostic information is necessary to determine patient infection status.  The expected result is negative.  Fact Sheet for Patients:   StrictlyIdeas.no   Fact Sheet for Healthcare Providers:   BankingDealers.co.za    This test is not yet approved or cleared by the [place] FDA and  has been authorized for detection and/or diagnosis of SARS-CoV-2 by FDA under an Emergency Use Authorization (EUA).  This EUA will remain in effect  (meaning this test can be used) for the duration of  the COVID-19 declaration under Section 564(b)(1) of the Act, 21 U.S.C. section 360-bbb-3(b)(1), unless the authorization is terminated or revoked sooner.  Performed at [facility], [place] [address], [place], [place] [address]   MRSA PCR Screening     Status: None   Collection Time: 01/03/21 10:02 PM   Specimen: Nasal Mucosa; Nasopharyngeal  Result Value Ref Range Status   MRSA by PCR NEGATIVE NEGATIVE Final    Comment:        The GeneXpert MRSA Assay (FDA approved for NASAL specimens only), is one component of a comprehensive MRSA colonization surveillance program. It is not intended to diagnose MRSA infection nor to guide or monitor treatment for MRSA infections. Performed at [facility], [place] [address], [place], [place] [address]   Culture, blood (routine x 2)     Status: None (Preliminary result)   Collection Time: 01/03/21 10:15 PM   Specimen: BLOOD  Result Value Ref Range Status   Specimen Description   Final    BLOOD Performed at [facility] [address] [place], [place] [address]    Special Requests   Final    BOTTLES DRAWN AEROBIC ONLY Performed at [facility] [address], [place], [place] [address]    Culture   Final    NO GROWTH 1 DAY Performed at [facility], [place] [address], [place], [place] [address]    Report Status PENDING  Incomplete  Culture, blood (routine x 2)     Status: None (Preliminary result)   Collection Time: 01/03/21 10:24 PM   Specimen: BLOOD RIGHT HAND  Result Value Ref Range Status   Specimen Description   Final    BLOOD RIGHT HAND Performed at [facility] [address] [place], [place] [address]    Special Requests   Final    BOTTLES DRAWN AEROBIC AND ANAEROBIC Blood Culture adequate volume Performed at [facility] [address], [place], [place] [address]    Culture   Final    NO GROWTH 1 DAY Performed at [facility], [place] [address], [place],  [address]    Report Status PENDING  Incomplete        Radiology Studies: DG CHEST PORT 1 VIEW  Result Date: 01/04/2021 CLINICAL DATA:  COVID-19.   Shortness of breath. EXAM: PORTABLE CHEST 1 VIEW COMPARISON:  Chest radiograph 01/02/2021. FINDINGS: Multi lead pacer apparatus overlies the right hemithorax. Stable enlarged cardiac and mediastinal contours. Similar-appearing diffuse bilateral airspace opacities. Small bilateral pleural effusions. No pneumothorax. Thoracic spine degenerative changes. Surgical clips left chest wall. IMPRESSION: Similar-appearing diffuse bilateral airspace opacities which may represent edema or infection. Small bilateral pleural effusions. Electronically Signed   By: Lovey Newcomer M.D.   On: 01/04/2021 09:03        Scheduled Meds: . atorvastatin  80 mg Oral Daily  .  carvedilol  25 mg Oral BID WC  . chlorhexidine  15 mL Mouth Rinse BID  . Chlorhexidine Gluconate Cloth  6 each Topical Q0600  . clopidogrel  75 mg Oral Daily  . dorzolamide-timolol  1 drop Both Eyes BID  . ferrous sulfate  325 mg Oral QODAY  . hydrALAZINE  25 mg Oral BID  . insulin aspart  0-9 Units Subcutaneous TID WC  . mouth rinse  15 mL Mouth Rinse q12n4p  . pantoprazole  40 mg Oral Daily   Continuous Infusions: . dextrose Stopped (01/05/21 1003)     LOS: 2 days     Cordelia Poche, MD [facility] 01/05/2021, 12:26 PM  If 7PM-7AM, please contact night-coverage [email or web address]

## 2021-01-05 NOTE — Progress Notes (Signed)
OT Cancellation Note  Patient Details Name: Desiree Pollard MRN: [id number] DOB: Aug 08, 1946   Cancelled Treatment:    Reason Eval/Treat Not Completed: Fatigue/lethargy limiting ability to participate: Nursing agrees to continue hold on OT Evaluation as pt remains too lethargic to participate and is following no commends.  Will continue to monitor.   Julien Girt 01/05/2021, 11:22 AM

## 2021-01-05 NOTE — Progress Notes (Signed)
Pharmacy Antibiotic Note  Desiree Pollard is a 75 y.o. female admitted on 01/02/2021 with sepsis, source undetermined.  Pharmacy has been consulted for cefepime and vancomycin dosing.  Today, patient is febrile with Tmax 101.1, WBC up 3.9, Scr trending up to 3.33. Treated with remdesivir from 01/02/21-01/04/21 for COVID-19 infection.  Plan: Cefepime 2 g IV every 24 hours Vancomycin 1000 mg IV every 48 hours (calculated AUC 552.2, Scr 3.33, IBW used to calculate CrCl & Ke, 0.72 Vd coefficient) Monitor clinical picture, renal function, vancomycin levels if indicated F/U C&S, abx deescalation / LOT   Height: 5\' 7"  (170.2 cm) Weight: 78.6 kg (173 lb 4.5 oz) IBW/kg (Calculated) : 61.6  Temp (24hrs), Avg:97.8 F (36.6 C), Min:96.3 F (35.7 C), Max:101.1 F (38.4 C)  Recent Labs  Lab 01/02/21 1947 01/03/21 0448 01/03/21 1100 01/03/21 2224 01/04/21 0143 01/04/21 1258 01/05/21 0220 01/05/21 1304  WBC 2.5* 2.8*  --   --  2.3*  --  3.9*  --   CREATININE 2.98* 2.95*   < > 2.87* 2.66* 3.13* 3.14* 3.33*  LATICACIDVEN 0.6  --   --   --   --   --   --   --    < > = values in this interval not displayed.    Estimated Creatinine Clearance: 15.5 mL/min (A) (by C-G formula based on SCr of 3.33 mg/dL (H)).    Allergies  Allergen Reactions  . Amlodipine Nausea And Vomiting    Pt refuses to take due to severe n/v that began after starting amlodipine and stopped after discontinuation.    Antimicrobials this admission: Cefepime 1/23 >> Vancomycin 1/23 >>   Microbiology results: 1/21 BCx: NGTD 1/20 UCx: no growth 1/21 MRSA PCR: neg  Thank you for allowing pharmacy to be a part of this patient's care.  Efraim Kaufmann, PharmD, BCPS 01/05/2021 1:57 PM

## 2021-01-05 NOTE — Progress Notes (Signed)
SLP Cancellation Note  Patient Details Name: Desiree Pollard MRN: [id number] DOB: [birth year]   Cancelled treatment:       Reason Eval/Treat Not Completed: Fatigue/lethargy limiting ability to participate  Gabriel Rainwater MA, CCC-SLP   Janye Maynor Meryl 01/05/2021, 1:39 PM

## 2021-01-06 ENCOUNTER — Inpatient Hospital Stay (HOSPITAL_COMMUNITY): Payer: Medicare HMO

## 2021-01-06 ENCOUNTER — Encounter (HOSPITAL_COMMUNITY): Payer: Self-pay | Admitting: Internal Medicine

## 2021-01-06 DIAGNOSIS — N184 Chronic kidney disease, stage 4 (severe): Secondary | ICD-10-CM | POA: Diagnosis not present

## 2021-01-06 DIAGNOSIS — U071 COVID-19: Secondary | ICD-10-CM | POA: Diagnosis not present

## 2021-01-06 DIAGNOSIS — G9341 Metabolic encephalopathy: Secondary | ICD-10-CM

## 2021-01-06 DIAGNOSIS — R220 Localized swelling, mass and lump, head: Secondary | ICD-10-CM | POA: Diagnosis not present

## 2021-01-06 DIAGNOSIS — E119 Type 2 diabetes mellitus without complications: Secondary | ICD-10-CM | POA: Diagnosis not present

## 2021-01-06 LAB — CBC WITH DIFFERENTIAL/PLATELET
Abs Immature Granulocytes: 0.03 10*3/uL (ref 0.00–0.07)
Basophils Absolute: 0 10*3/uL (ref 0.0–0.1)
Basophils Relative: 1 %
Eosinophils Absolute: 0 10*3/uL (ref 0.0–0.5)
Eosinophils Relative: 1 %
HCT: 26.6 % — ABNORMAL LOW (ref 36.0–46.0)
Hemoglobin: 7.8 g/dL — ABNORMAL LOW (ref 12.0–15.0)
Immature Granulocytes: 1 %
Lymphocytes Relative: 13 %
Lymphs Abs: 0.5 10*3/uL — ABNORMAL LOW (ref 0.7–4.0)
MCH: 28.1 pg (ref 26.0–34.0)
MCHC: 29.3 g/dL — ABNORMAL LOW (ref 30.0–36.0)
MCV: 95.7 fL (ref 80.0–100.0)
Monocytes Absolute: 0.4 10*3/uL (ref 0.1–1.0)
Monocytes Relative: 9 %
Neutro Abs: 3 10*3/uL (ref 1.7–7.7)
Neutrophils Relative %: 75 %
Platelets: 77 10*3/uL — ABNORMAL LOW (ref 150–400)
RBC: 2.78 MIL/uL — ABNORMAL LOW (ref 3.87–5.11)
RDW: 15.9 % — ABNORMAL HIGH (ref 11.5–15.5)
WBC: 4 10*3/uL (ref 4.0–10.5)
nRBC: 1.3 % — ABNORMAL HIGH (ref 0.0–0.2)

## 2021-01-06 LAB — COMPREHENSIVE METABOLIC PANEL
ALT: 52 U/L — ABNORMAL HIGH (ref 0–44)
AST: 36 U/L (ref 15–41)
Albumin: 2.4 g/dL — ABNORMAL LOW (ref 3.5–5.0)
Alkaline Phosphatase: 123 U/L (ref 38–126)
Anion gap: 11 (ref 5–15)
BUN: 48 mg/dL — ABNORMAL HIGH (ref 8–23)
CO2: 20 mmol/L — ABNORMAL LOW (ref 22–32)
Calcium: 9.1 mg/dL (ref 8.9–10.3)
Chloride: 119 mmol/L — ABNORMAL HIGH (ref 98–111)
Creatinine, Ser: 3.47 mg/dL — ABNORMAL HIGH (ref 0.44–1.00)
GFR, Estimated: 13 mL/min — ABNORMAL LOW (ref >60)
Glucose, Bld: 74 mg/dL (ref 70–99)
Potassium: 3.6 mmol/L (ref 3.5–5.1)
Sodium: 150 mmol/L — ABNORMAL HIGH (ref 135–145)
Total Bilirubin: 0.6 mg/dL (ref 0.3–1.2)
Total Protein: 5.9 g/dL — ABNORMAL LOW (ref 6.5–8.1)

## 2021-01-06 LAB — GLUCOSE, CAPILLARY
Glucose-Capillary: 70 mg/dL (ref 70–99)
Glucose-Capillary: 78 mg/dL (ref 70–99)
Glucose-Capillary: 107 mg/dL — ABNORMAL HIGH (ref 70–99)
Glucose-Capillary: 105 mg/dL — ABNORMAL HIGH (ref 70–99)
Glucose-Capillary: 114 mg/dL — ABNORMAL HIGH (ref 70–99)

## 2021-01-06 LAB — BLOOD GAS, ARTERIAL
Acid-base deficit: 1.7 mmol/L (ref 0.0–2.0)
Bicarbonate: 22.1 mmol/L (ref 20.0–28.0)
Drawn by: 29503
FIO2: 0.21
O2 Content: 98 L/min
O2 Saturation: 93.8 %
Patient temperature: 98.6
pCO2 arterial: 36.2 mmHg (ref 32.0–48.0)
pH, Arterial: 7.404 (ref 7.350–7.450)
pO2, Arterial: 75.6 mmHg — ABNORMAL LOW (ref 83.0–108.0)

## 2021-01-06 LAB — SEDIMENTATION RATE: Sed Rate: 42 mm/hr — ABNORMAL HIGH (ref 0–22)

## 2021-01-06 LAB — C-REACTIVE PROTEIN: CRP: 2.5 mg/dL — ABNORMAL HIGH (ref <1.0)

## 2021-01-06 LAB — D-DIMER, QUANTITATIVE: D-Dimer, Quant: 1.52 ug/mL-FEU — ABNORMAL HIGH (ref 0.00–0.50)

## 2021-01-06 LAB — AMMONIA: Ammonia: 23 umol/L (ref 9–35)

## 2021-01-06 LAB — PROCALCITONIN: Procalcitonin: <0.1 ng/mL

## 2021-01-06 IMAGING — CT CT NECK W/O CM
4 series · 15 of 33 positions shown, 18 images · non-contrast
Comparison: None.

CLINICAL DATA: Mental status change, concern for laryngeal edema

EXAM:
CT NECK WITHOUT CONTRAST
TECHNIQUE: Multidetector CT imaging of the neck was performed following the
standard protocol without intravenous contrast.

[Series 3: axial neck · axial · 0.52mm/px · z∈[-302,-124]mm · 5 of 130 slices shown, 7 images]
[im 22/130  soft-tissue]
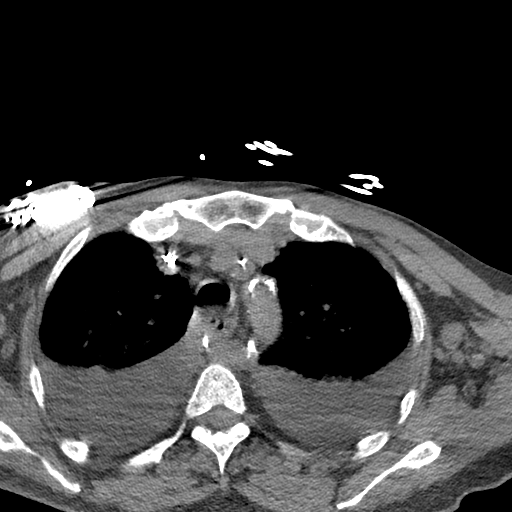
[im 22/130  bone]
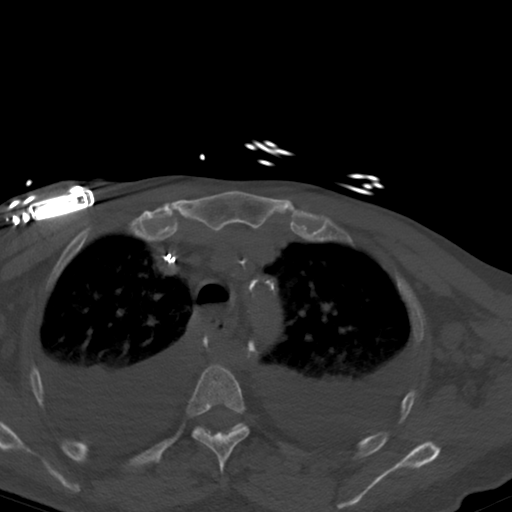
[im 44/130  bone]
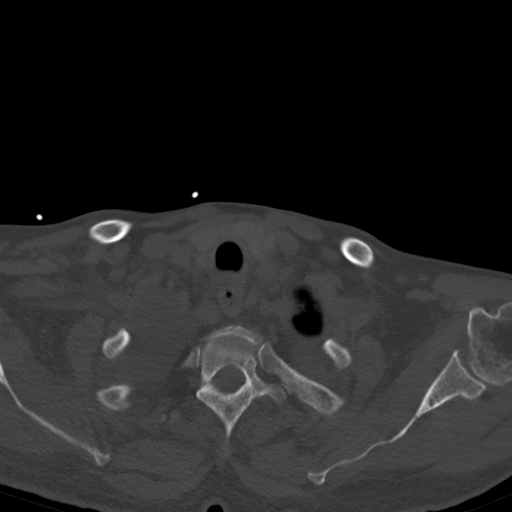
[im 65/130  bone]
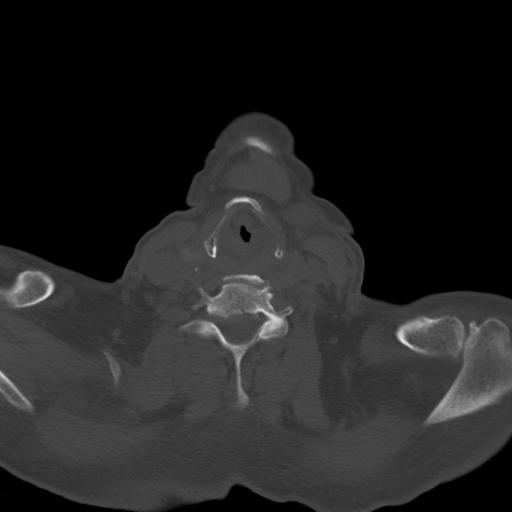
[im 87/130  bone]
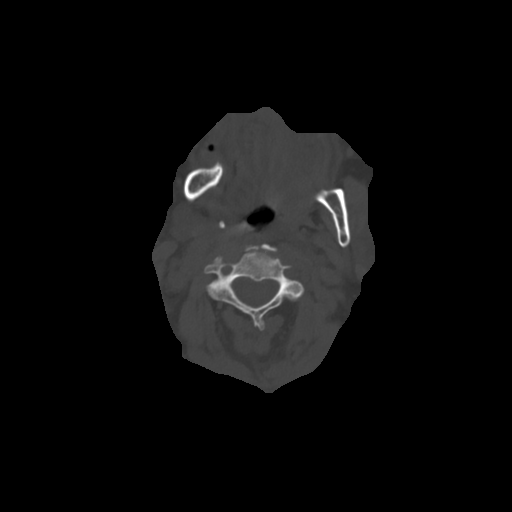
[im 108/130  soft-tissue]
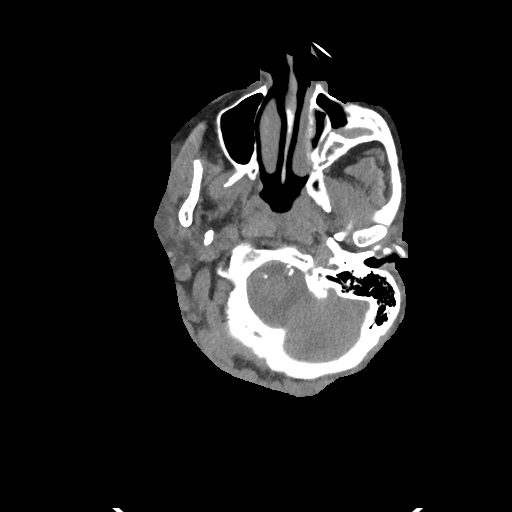
[im 108/130  bone]
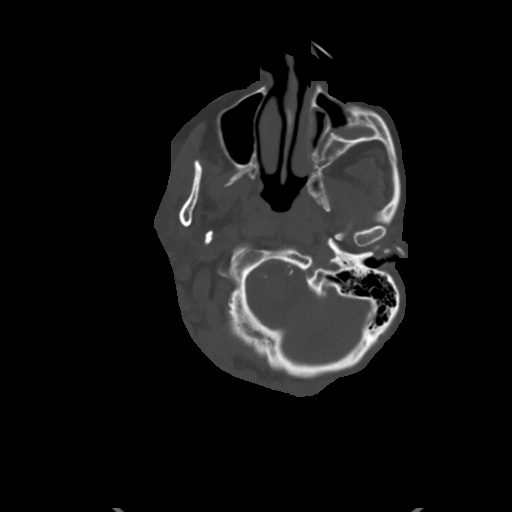

[Series 5: axial bone · axial · 0.52mm/px · z∈[-300,-256]mm · 2 of 133 slices shown]
[im 23/133  bone]
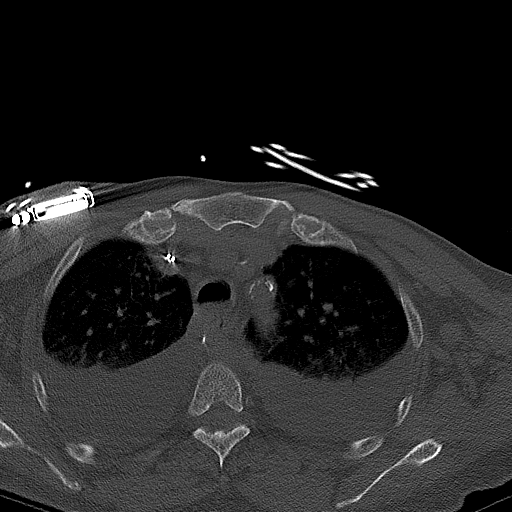
[im 45/133  bone]
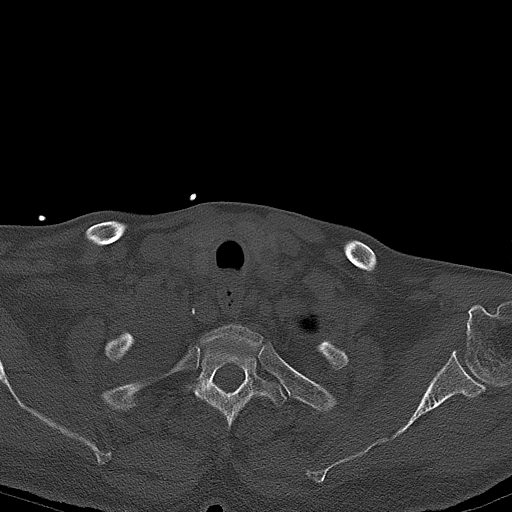

[Series 7: cor neck · coronal · 0.52mm/px · 3 of 118 slices shown]
[im 28/118  bone]
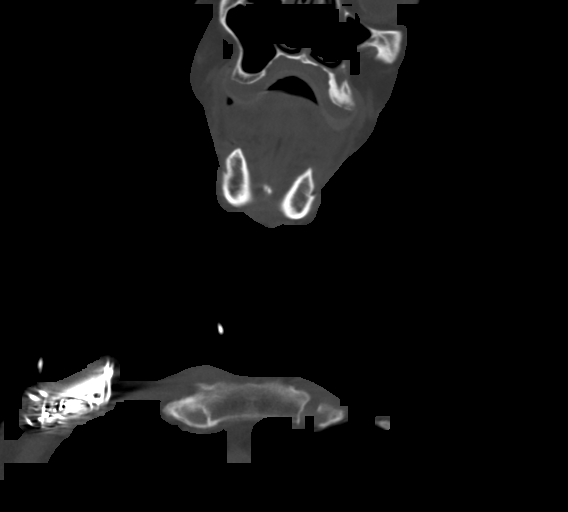
[im 49/118  bone]
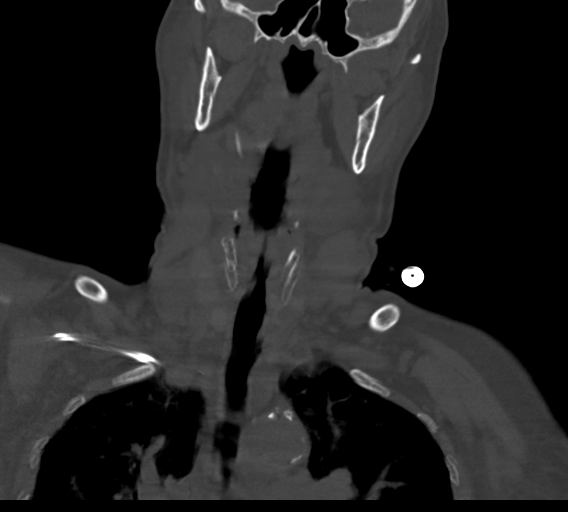
[im 69/118  bone]
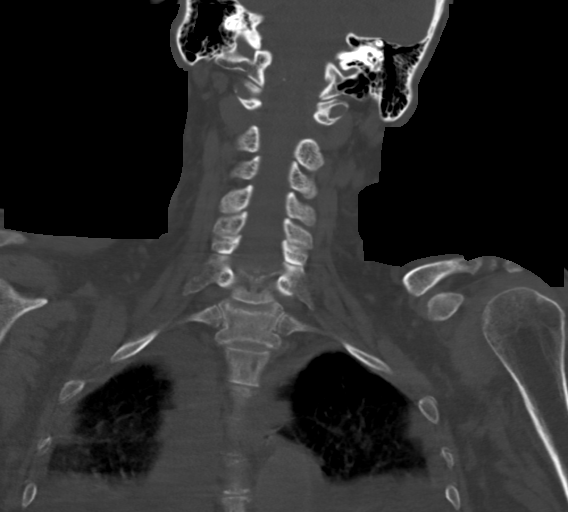

[Series 8: sag neck · sagittal · 0.54mm/px · 5 of 124 slices shown, 6 images]
[im 42/124  bone]
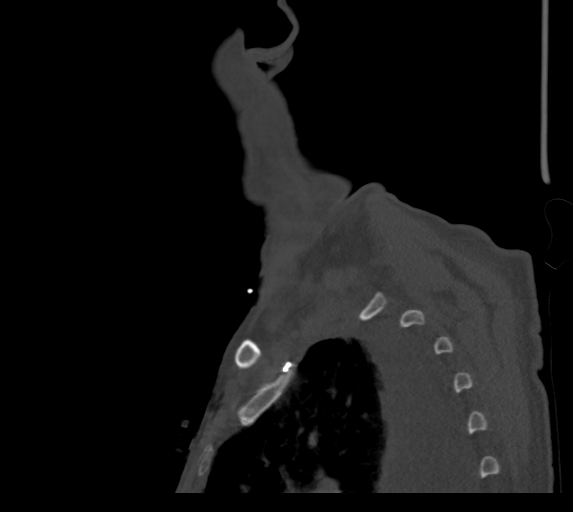
[im 52/124  bone]
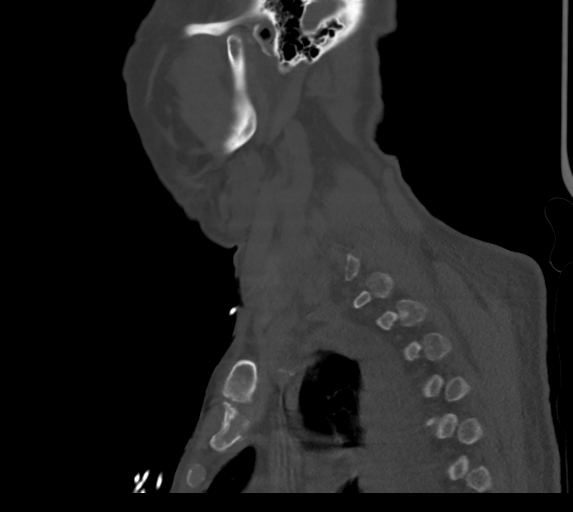
[im 62/124  soft-tissue]
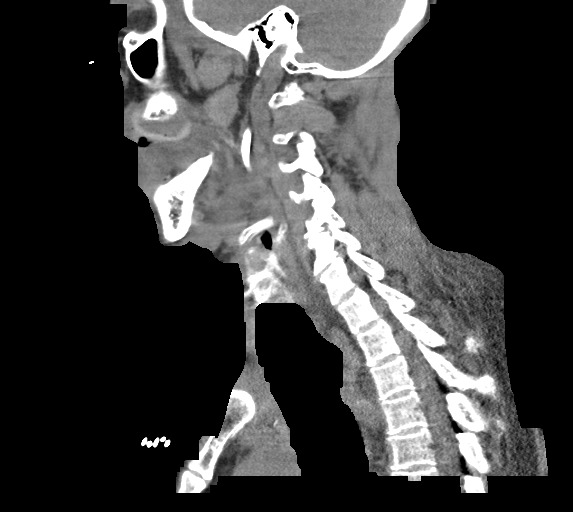
[im 62/124  bone]
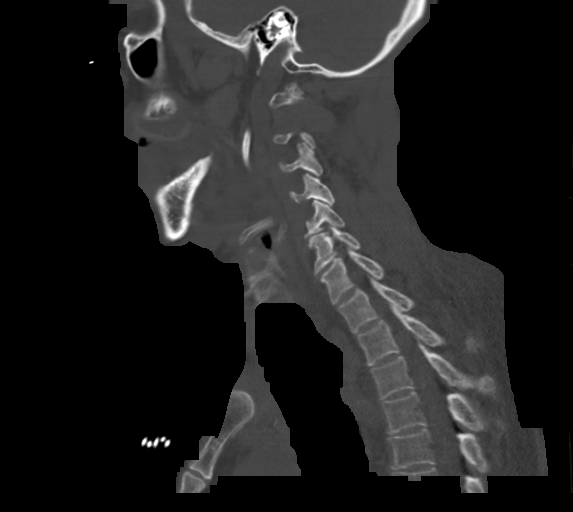
[im 72/124  bone]
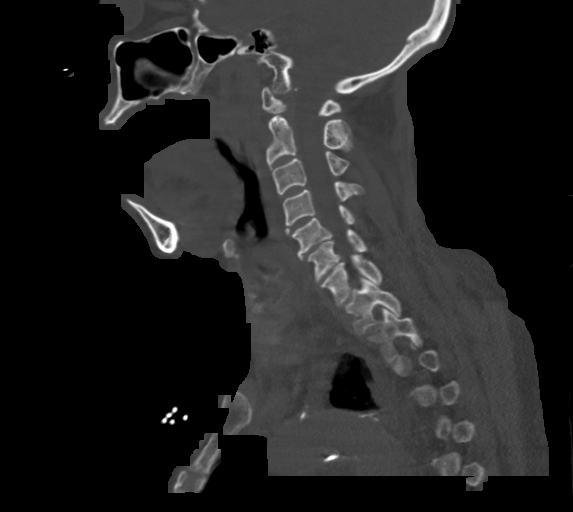
[im 83/124  bone]
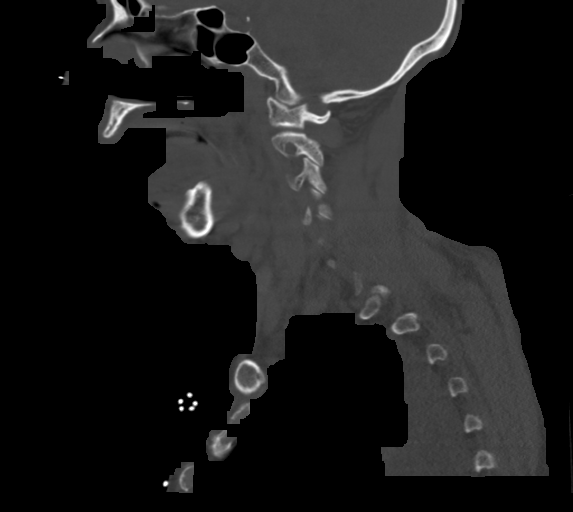

[15 of 33 positions shown; findings below may reference images not displayed]

FINDINGS: Motion artifact is present.

Pharynx and larynx: No mass or swelling identified within above
limitation.

Salivary glands: Unremarkable.

Thyroid: Subcentimeter left thyroid nodule for which no further
ultrasound follow-up is recommended by current guidelines.

Lymph nodes: No enlarged lymph nodes identified.

Vascular: Calcified plaque at the common carotid bifurcations.

Limited intracranial: Dictated separately.

Visualized orbits: Bilateral lens replacements.

Mastoids and visualized paranasal sinuses: Presumed prior left
maxillary sinus surgery with mucosal thickening. Otherwise minor
mucosal thickening. Mastoid air cells are clear.

Skeleton: Cervical spine degenerative changes.

Upper chest: Refer to dedicated chest imaging.

Other: None.
IMPRESSION: Motion degraded study. No significant swelling or other acute
abnormality identified.

## 2021-01-06 IMAGING — CT CT CHEST W/O CM
2 of 4 series · 15 of 36 positions shown, 18 images · non-contrast
Comparison: Chest radiograph from one day prior.

CLINICAL DATA: Inpatient. COVID positive. Abnormal chest radiograph
with pleural effusions.

EXAM:
CT CHEST WITHOUT CONTRAST
TECHNIQUE: Multidetector CT imaging of the chest was performed following the
standard protocol without IV contrast.

[Series 3: thorax · axial · 0.66mm/px · z∈[-515,-229]mm · 12 of 170 slices shown, 15 images]
[im 14/170  mediastinal]
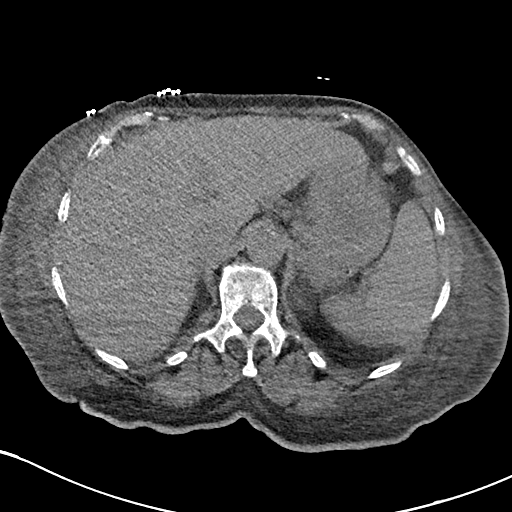
[im 14/170  lung]
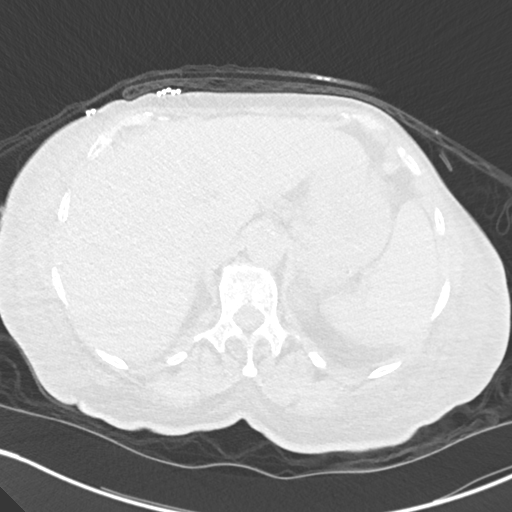
[im 27/170  lung]
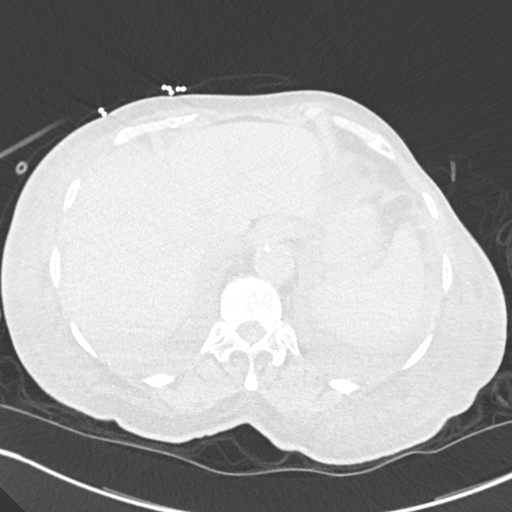
[im 40/170  lung]
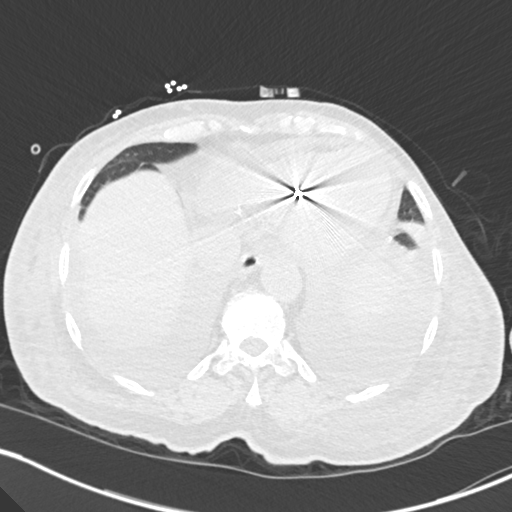
[im 53/170  lung]
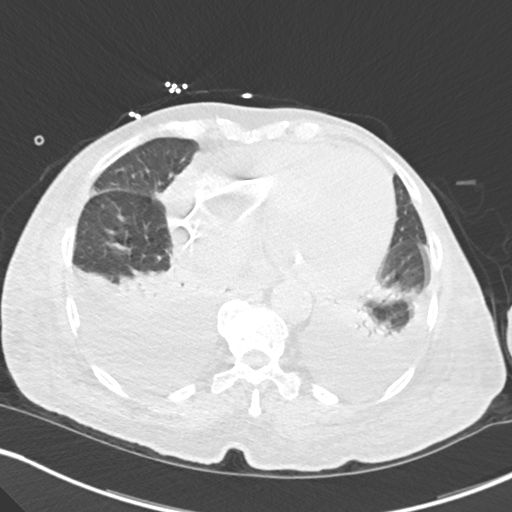
[im 66/170  mediastinal]
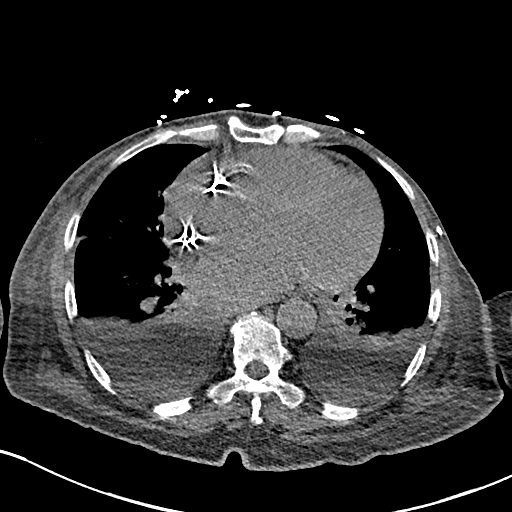
[im 66/170  lung]
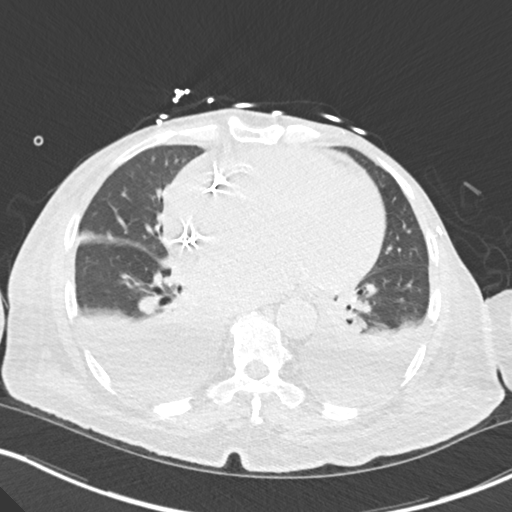
[im 79/170  lung]
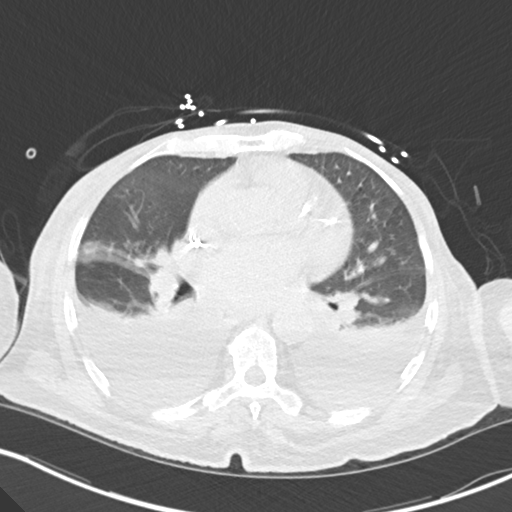
[im 92/170  lung]
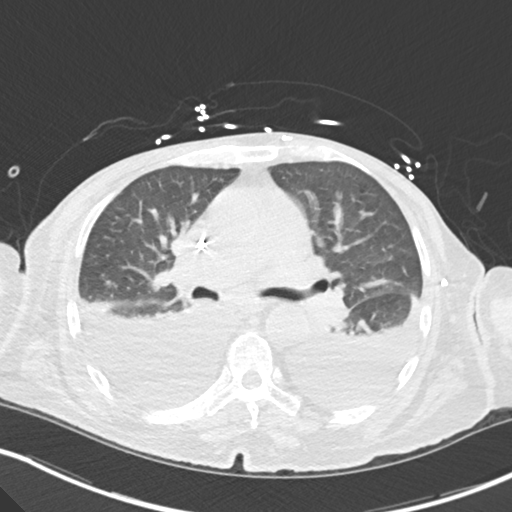
[im 105/170  lung]
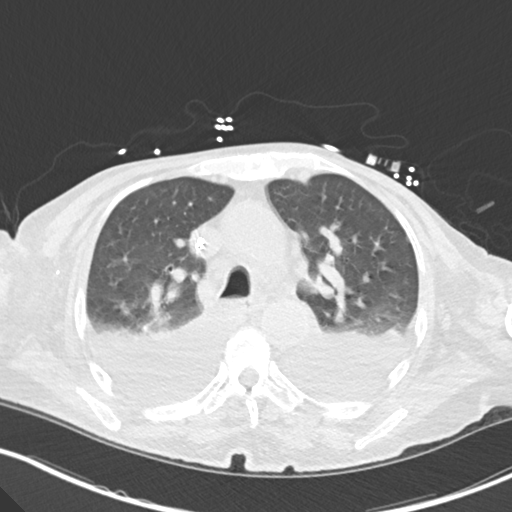
[im 118/170  mediastinal]
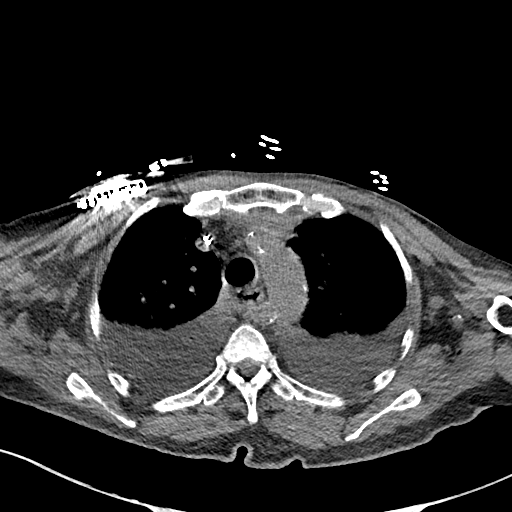
[im 118/170  lung]
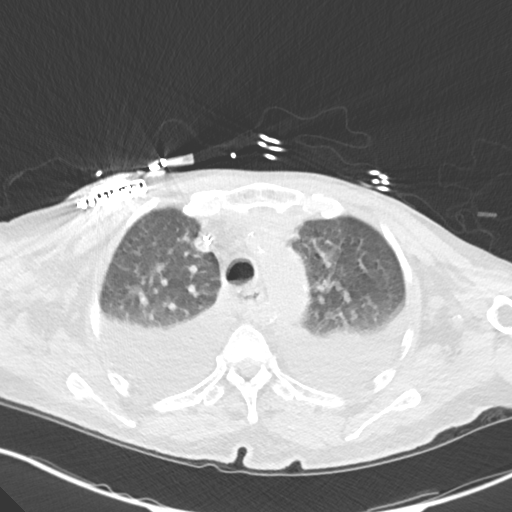
[im 131/170  lung]
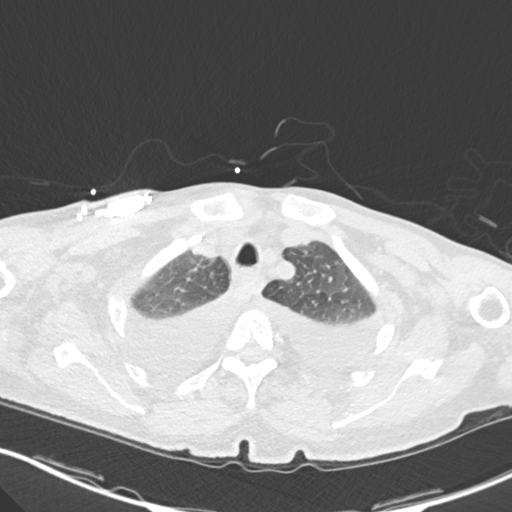
[im 144/170  lung]
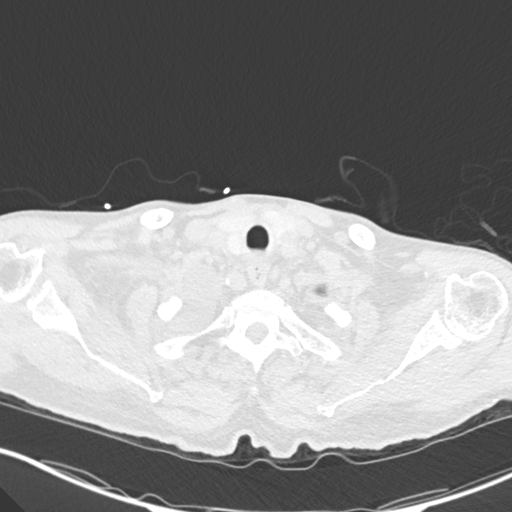
[im 157/170  lung]
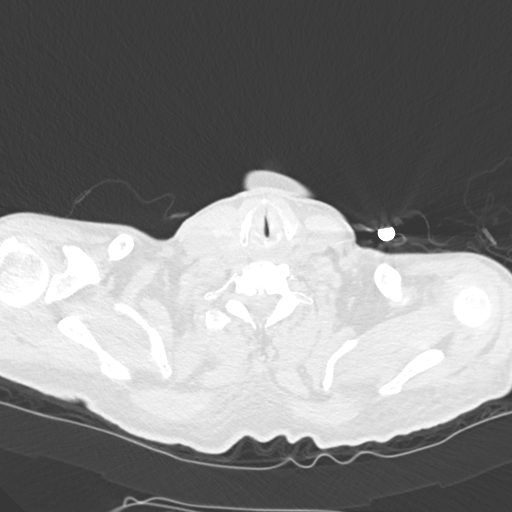

[Series 6: coronal · coronal · 0.66mm/px · 3 of 130 slices shown]
[im 26/130  lung]
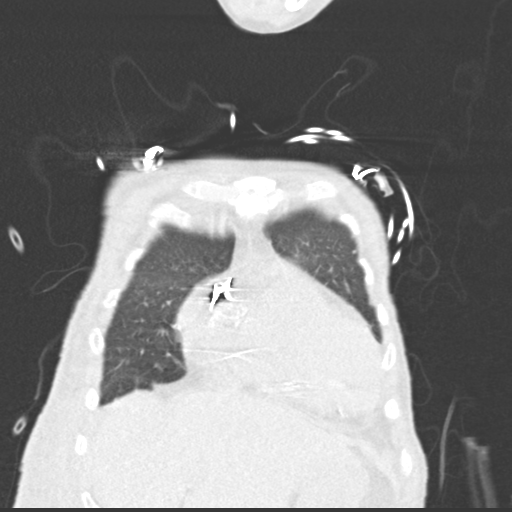
[im 52/130  lung]
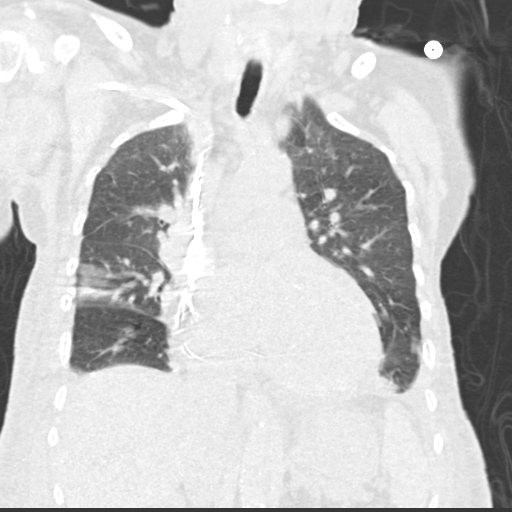
[im 78/130  lung]
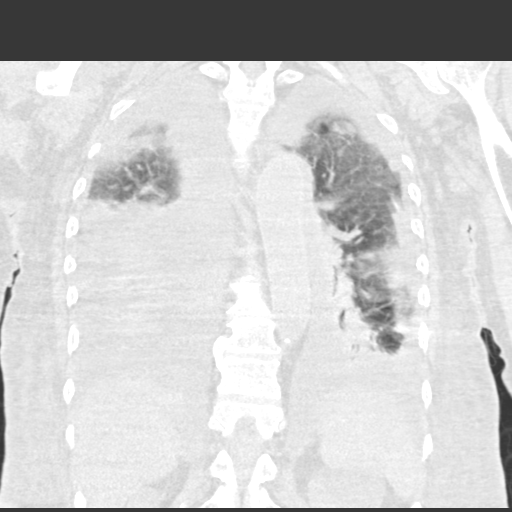

[15 of 36 positions shown; findings below may reference images not displayed]

FINDINGS: Cardiovascular: Cardiomegaly. Small pericardial effusion/thickening.
Two lead right subclavian pacemaker with lead tips in the right
atrium and right ventricular apex. Three-vessel coronary
atherosclerosis. Atherosclerotic nonaneurysmal thoracic aorta.
Normal caliber pulmonary arteries. Aberrant nonaneurysmal right
subclavian artery arising from the distal aortic arch with
retroesophageal course.

Mediastinum/Nodes: Subcentimeter hypodense bilateral thyroid
nodules. Not clinically significant; no follow-up imaging
recommended (ref: [HOSPITAL]. [DATE]): 143-50).
Unremarkable esophagus. No pathologically enlarged axillary,
mediastinal or hilar lymph nodes, noting limited sensitivity for the
detection of hilar adenopathy on this noncontrast study.

Lungs/Pleura: No pneumothorax. Moderate dependent bilateral pleural
effusions. Moderate passive atelectasis in dependent lungs
bilaterally. Fairly uniform ground-glass opacity and mild
interlobular septal thickening throughout both lungs. No lung masses
or significant pulmonary nodules in the aerated portions of the
lungs.

Upper abdomen: Simple upper left renal cysts, largest 4.0 cm.

Musculoskeletal: No aggressive appearing focal osseous lesions.
Moderate anasarca. Mild thoracic spondylosis.
IMPRESSION: 1. Spectrum of findings most compatible with congestive heart
failure. Cardiomegaly. Fairly uniform ground-glass opacity and mild
interlobular septal thickening throughout both lungs, compatible
with mild cardiogenic pulmonary edema. Moderate dependent bilateral
pleural effusions. Moderate anasarca.
2. Small pericardial effusion/thickening.
3. Three-vessel coronary atherosclerosis.
4. Aberrant right subclavian artery.
5. Aortic Atherosclerosis ([LR]-[LR]).

## 2021-01-06 IMAGING — CT CT HEAD W/O CM
3 of 4 series · 15 of 47 positions shown, 18 images · non-contrast
Comparison: [DATE]

CLINICAL DATA: Male status changes.

EXAM:
CT HEAD WITHOUT CONTRAST
TECHNIQUE: Contiguous axial images were obtained from the base of the skull
through the vertex without intravenous contrast.

[Series 3: head wo · axial · 0.47mm/px · z∈[-105,+30]mm · 9 of 33 slices shown, 12 images]
[im 3/33  brain]
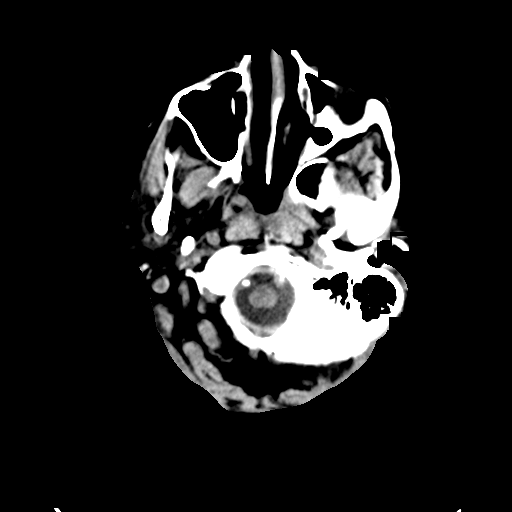
[im 3/33  bone]
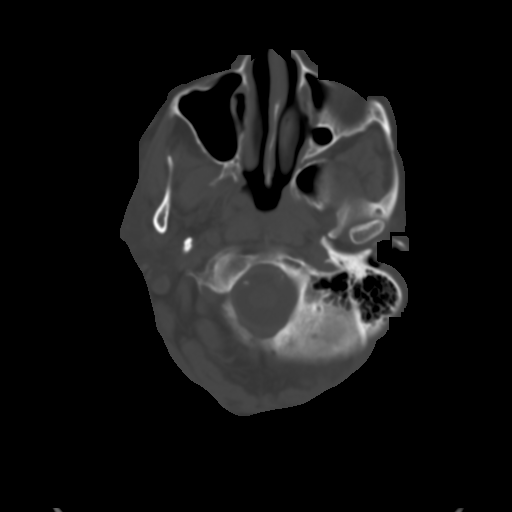
[im 7/33  brain]
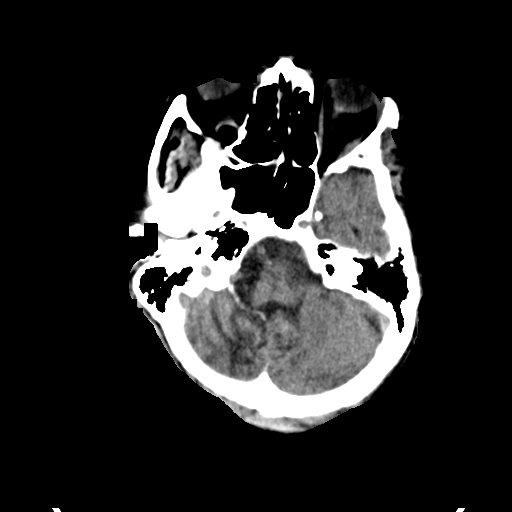
[im 10/33  brain]
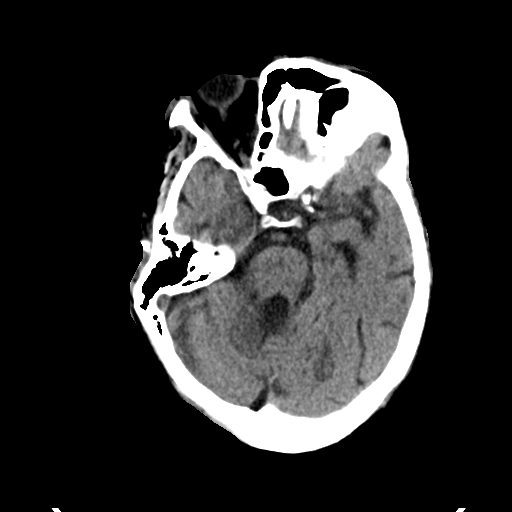
[im 14/33  brain]
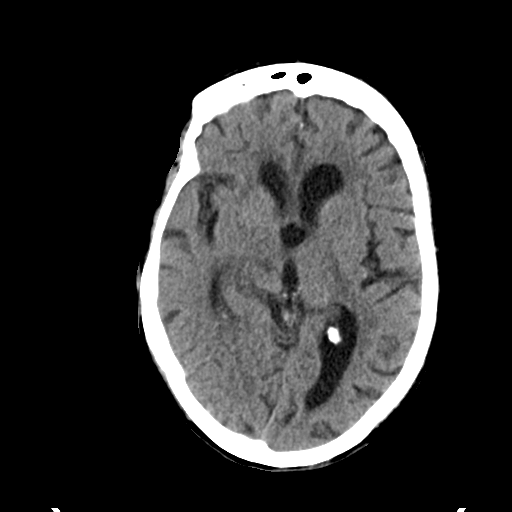
[im 17/33  brain]
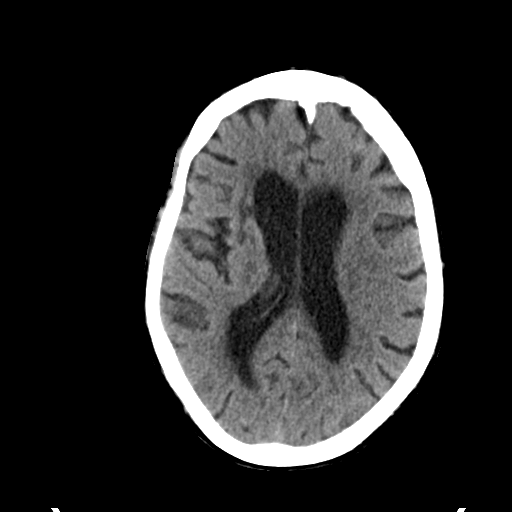
[im 17/33  bone]
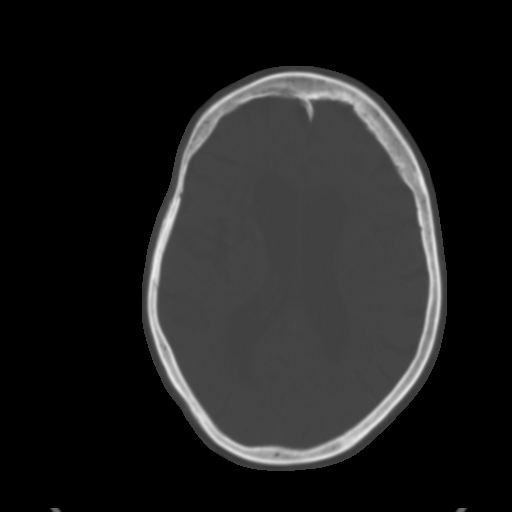
[im 19/33  brain]
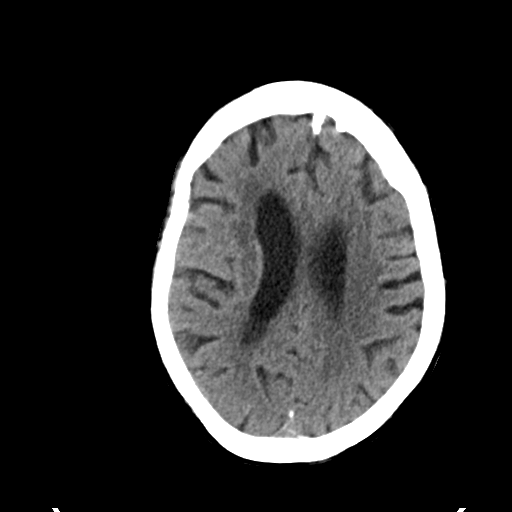
[im 23/33  brain]
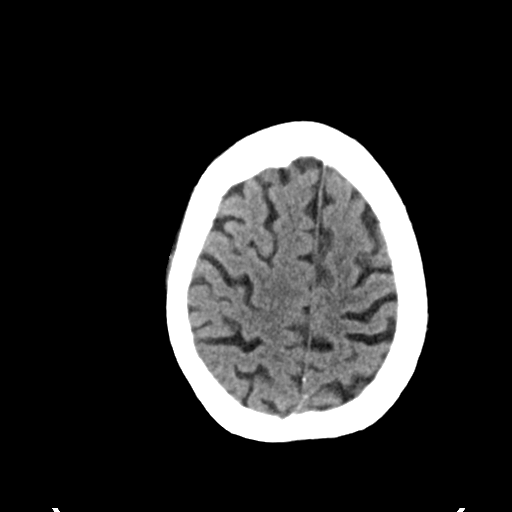
[im 26/33  brain]
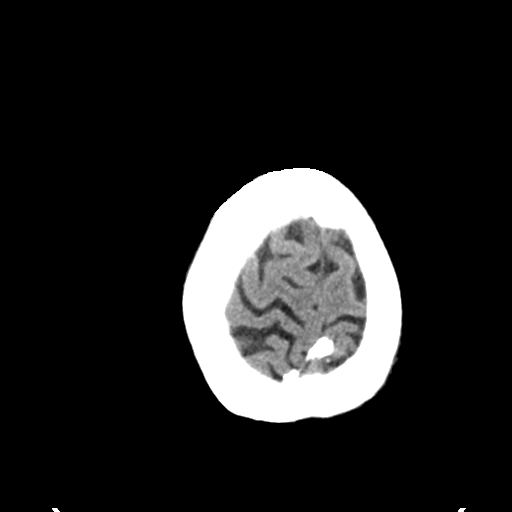
[im 30/33  brain]
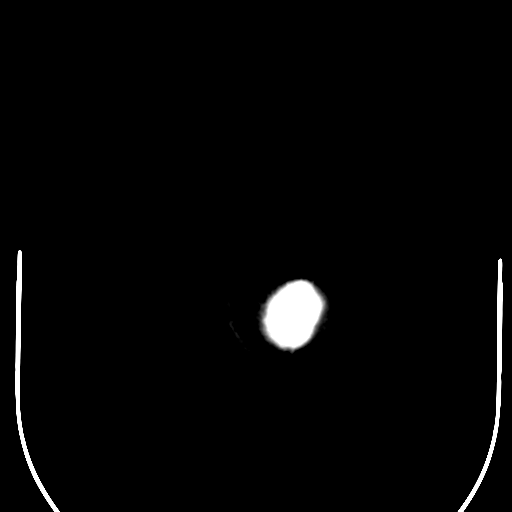
[im 30/33  bone]
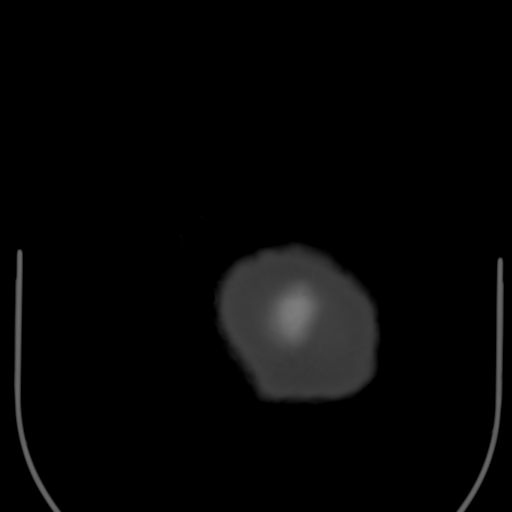

[Series 5: coronal soft tissue · coronal · 0.35mm/px · 3 of 67 slices shown]
[im 23/67  brain]
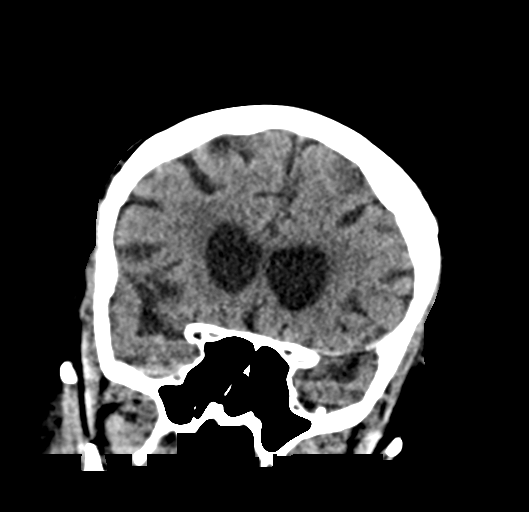
[im 30/67  brain]
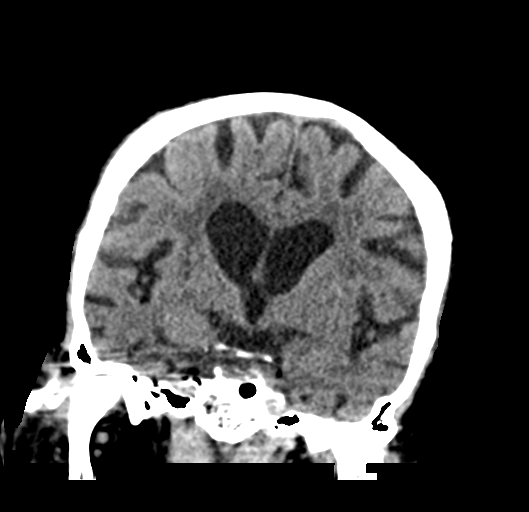
[im 37/67  brain]
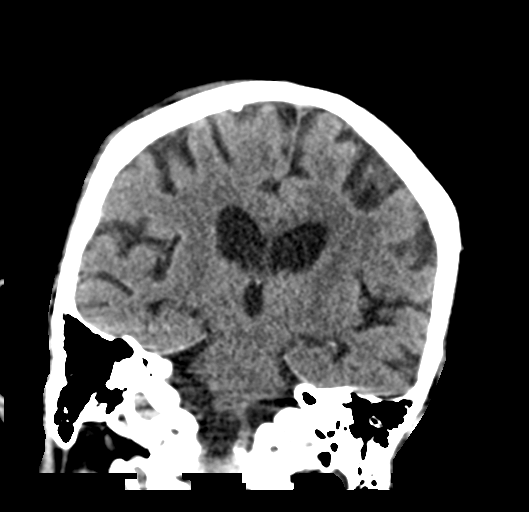

[Series 6: sagittal soft tissue · sagittal · 0.36mm/px · 3 of 55 slices shown]
[im 19/55  brain]
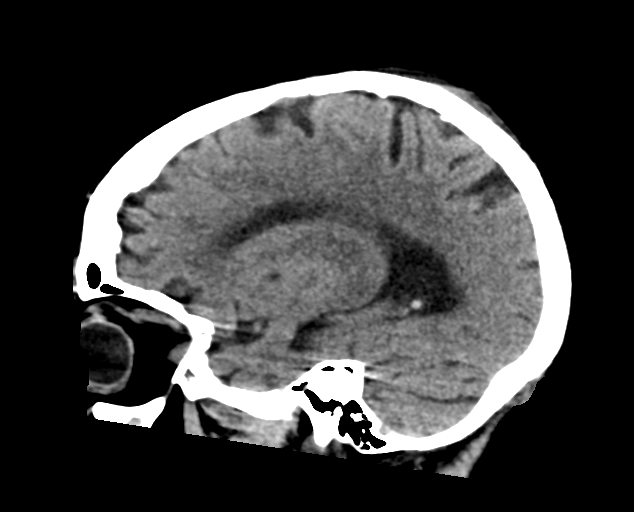
[im 28/55  brain]
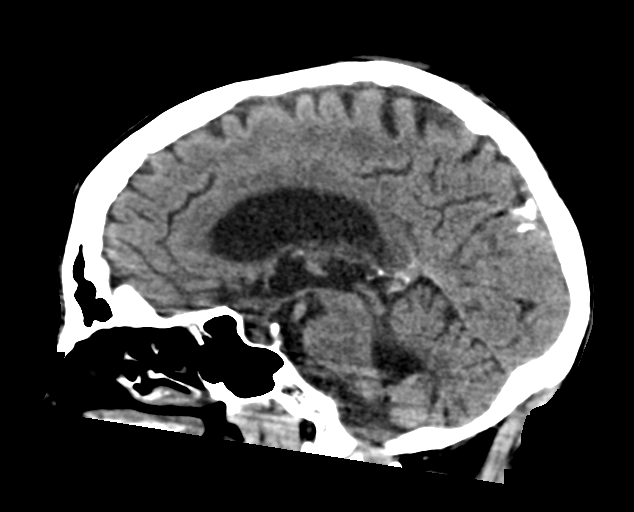
[im 37/55  brain]
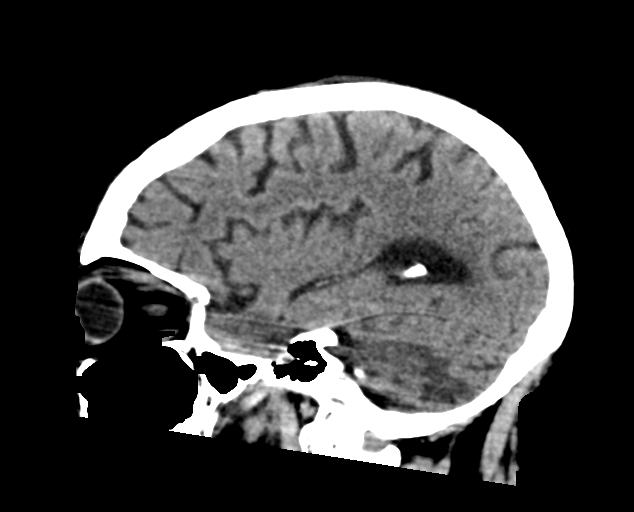

[15 of 47 positions shown; findings below may reference images not displayed]

FINDINGS: Brain: There is no evidence for acute hemorrhage, hydrocephalus,
mass lesion, or abnormal extra-axial fluid collection. No definite
CT evidence for acute infarction. Lacunar infarcts noted right basal
ganglia. Encephalomalacia in both cerebellar hemispheres compatible
with old infarcts. Diffuse loss of parenchymal volume is consistent
with atrophy. Patchy low attenuation in the deep hemispheric and
periventricular white matter is nonspecific, but likely reflects
chronic microvascular ischemic demyelination.

Vascular: No hyperdense vessel or unexpected calcification.

Skull: No evidence for fracture. No worrisome lytic or sclerotic
lesion.

Sinuses/Orbits: Deformity of the left maxillary sinus may be related
to previous trauma Visualized portions of the globes and
intraorbital fat are unremarkable.

Other: None.
IMPRESSION: 1. No acute intracranial abnormality.
2. Atrophy with chronic small vessel white matter ischemic disease.
3. Old bilateral cerebellar infarcts.

## 2021-01-06 MED ORDER — DIPHENHYDRAMINE HCL 50 MG/ML IJ SOLN
50.0000 mg | Freq: Three times a day (TID) | INTRAMUSCULAR | Status: DC
  Administered 2021-01-06 – 2021-01-09 (×9): 50 mg via INTRAVENOUS
  Filled 2021-01-06 (×11): qty 1

## 2021-01-06 MED ORDER — METHYLPREDNISOLONE SODIUM SUCC 125 MG IJ SOLR
80.0000 mg | Freq: Four times a day (QID) | INTRAMUSCULAR | Status: DC
  Administered 2021-01-06 – 2021-01-07 (×5): 80 mg via INTRAVENOUS
  Filled 2021-01-06 (×5): qty 2

## 2021-01-06 MED ORDER — CHLORHEXIDINE GLUCONATE CLOTH 2 % EX PADS
6.0000 | MEDICATED_PAD | Freq: Every day | CUTANEOUS | Status: DC
  Administered 2021-01-06 – 2021-02-05 (×26): 6 via TOPICAL

## 2021-01-06 MED ORDER — SODIUM CHLORIDE 0.9 % IV SOLN
2.0000 g | Freq: Every day | INTRAVENOUS | Status: DC
  Administered 2021-01-07 – 2021-01-08 (×2): 2 g via INTRAVENOUS
  Filled 2021-01-06 (×2): qty 2

## 2021-01-06 MED ORDER — FUROSEMIDE 10 MG/ML IJ SOLN: 40.0000 mg | Freq: Two times a day (BID) | INTRAMUSCULAR | Status: DC

## 2021-01-06 MED ORDER — FAMOTIDINE IN NACL 20-0.9 MG/50ML-% IV SOLN
20.0000 mg | INTRAVENOUS | Status: DC
  Administered 2021-01-06 – 2021-01-19 (×14): 20 mg via INTRAVENOUS
  Filled 2021-01-06 (×16): qty 50

## 2021-01-06 MED ORDER — FUROSEMIDE 10 MG/ML IJ SOLN
40.0000 mg | Freq: Two times a day (BID) | INTRAMUSCULAR | Status: DC
  Administered 2021-01-06 – 2021-01-07 (×2): 40 mg via INTRAVENOUS
  Filled 2021-01-06 (×2): qty 4

## 2021-01-06 NOTE — Progress Notes (Signed)
Second attempt on NG insertion. Unsuccessful due to resistance in nasal passage.

## 2021-01-06 NOTE — Progress Notes (Signed)
PROGRESS NOTE    Desiree Pollard  ZDG:[id number] DOB: 1946/07/26 DOA: 01/02/2021 PCP: [name], FNP   Brief Narrative: Desiree Pollard is a 75 y.o. female with a history of dementia, CKD stage IV, diabetes mellitus, CVA. Patient presented secondary to altered mental status and found to have hypernatremia. Started on IV fluids. Also found to have COVID-19 and started on Remdesivir. No evidence of pneumonia.   Assessment & Plan:   Principal Problem:   Acute metabolic encephalopathy Active Problems:   Insulin dependent type 2 diabetes mellitus (HCC)   Hypertension   History of stroke   CKD (chronic kidney disease), stage IV ([place])   COVID-19 virus infection   Hypernatremia   Acute metabolic encephalopathy Possibly secondary to hypernatremia and dehydration started on IV fluids. Urine culture obtained on admission however urinalysis does not suggest infection. Mentation worsened again. She has some mild hypoglycemia this morning. Prior CT head without abnormalities. No seizure history. With COVID-19 infection, it is possible this could be the driver but will continue to search for alternate causes. Troponin mildly elevated but at baseline. Ammonia normal. -Repeat CT head; EEG  Possible angioedema Lower lip and tongue appear swollen. New medications include Vancomycin/Cefepime. Received Remdesivir earlier in admission but that has completed. Concern this may progress to patient losing her airway -PCCM consult -Solumedrol/Pepcid/Benadryl IV -CT neck  Hypernatremia Likely secondary to poor oral intake. Sodium of 154 on admission with slight trend down. Started on 1/2 NS IV fluids which were transitioned to D5 water fluids. Fluids discontinued 1/23 secondary to concern for fluid overload. -Serial BMP  COVID-19 infection No evidence of pneumonia. Started on Remdesivir on admission. CRP normal. Febrile today.  Edema In setting of IV fluids for treatment of hypernatremia.  Patient has a history of diastolic heart failure. BNP is elevated. Prior chest x-ray with infiltrate vs edema. -Continue Lasix IV diuresis -Daily weights -CT chest  Chronic diastolic heart failure Seen on recent Transthoracic Echocardiogram from 10/2020. EF of 60-65%.  Leukopenia Secondary to COVID-19 infection with lymphocytopenia. Normal ANC.  AKI on CKD stage IV Baseline creatinine of about 2.6. Creatinine of 2.98 on admission and currently stable. Appears likely secondary to hypovolemia and poor oral intake. IV fluids held secondary to likely volume overload. Creatinine appears to be peaking with IV lasix. Good urine output; not concerned for obstruction at this time. -Continue Lasix 40 mg IV BID  Hypothermia Unknown etiology. COVID-19 infection. Procalcitonin undetectable. No leukocytosis. No signs concerning for infection/sepsis. Urine culture with no growth. Resolved with Bair hugger. -Follow up blood cultures  Fever No specific source except known COVID-19 infection. Holding Vancomycin secondary to concern for angioedema. Procalcitonin negative. -Continue to follow culture results -Cefepime empirically  Anemia of chronic disease Likely secondary to kidney disease. Stable.  Thrombocytopenia  No evidence of bleeding. Likely related to infection. Patient is also on aspirin and Plavix for history of stroke. Stable.  History of stroke On aspirin and Plavix as an outpatient -Continue aspirin/Plavix   DVT prophylaxis: SCDs Code Status:   Code Status: Full Code Family Communication: Called both sisters with no response. Disposition Plan: Discharge back to SNF in several days pending improvement of sodium, evaluation for swallowing, improvement of mentation and workup for possible infection.   Consultants:   None  Procedures:   None  Antimicrobials:  Remdesivir  Vancomycin  Cefepime   Subjective: No overt issues overnight. Patient is still very  lethargic.  Objective: Vitals:   01/06/21 0927 01/06/21 1011 01/06/21 1219  01/06/21 1309  BP: (!) 171/64  (!) 175/56 (!) 166/52  Pulse:   69 74  Resp:      Temp:  97.9 F (36.6 C) 98.3 F (36.8 C)   TempSrc:  Oral Oral   SpO2:      Weight:      Height:        Intake/Output Summary (Last 24 hours) at 01/06/2021 1318 Last data filed at 01/06/2021 1146 Gross per 24 hour  Intake 447.22 ml  Output 2700 ml  Net -2252.78 ml   Filed Weights   01/03/21 2209 01/05/21 1400 01/06/21 0412  Weight: 78.6 kg 79.1 kg 79.1 kg    Examination:  General exam: Appears calm and comfortable HEENT: rightward gaze. Fixed. Tongue is large and edematous. Lower lip is also edematous. Respiratory system: Clear to auscultation but diminished. Patient with supraclavicular retractions that are mild. Cardiovascular system: S1 & S2 heard, RRR. No murmurs, rubs, gallops or clicks. Gastrointestinal system: Abdomen is nondistended, soft and nontender. No organomegaly or masses felt. Normal bowel sounds heard. Central nervous system: Patient opens her eyes to stimuli but does not follow commands. No tremors. Musculoskeletal: Edema. No calf tenderness Skin: No cyanosis. No rashes Psychiatry: Judgement and insight appear impaired.    Data Reviewed: I have personally reviewed following labs and imaging studies  CBC Lab Results  Component Value Date   WBC 4.0 01/06/2021   RBC 2.78 (L) 01/06/2021   HGB 7.8 (L) 01/06/2021   HCT 26.6 (L) 01/06/2021   MCV 95.7 01/06/2021   MCH 28.1 01/06/2021   PLT 77 (L) 01/06/2021   MCHC 29.3 (L) 01/06/2021   RDW 15.9 (H) 01/06/2021   LYMPHSABS 0.5 (L) 01/06/2021   MONOABS 0.4 01/06/2021   EOSABS 0.0 01/06/2021   BASOSABS 0.0 41/74/0814     Last metabolic panel Lab Results  Component Value Date   NA 150 (H) 01/06/2021   K 3.6 01/06/2021   CL 119 (H) 01/06/2021   CO2 20 (L) 01/06/2021   BUN 48 (H) 01/06/2021   CREATININE 3.47 (H) 01/06/2021   GLUCOSE 74  01/06/2021   GFRNONAA 13 (L) 01/06/2021   GFRAA 20 (L) 08/07/2020   CALCIUM 9.1 01/06/2021   PHOS 3.2 10/21/2020   PROT 5.9 (L) 01/06/2021   ALBUMIN 2.4 (L) 01/06/2021   LABGLOB 3.4 08/07/2020   AGRATIO 1.3 08/07/2020   BILITOT 0.6 01/06/2021   ALKPHOS 123 01/06/2021   AST 36 01/06/2021   ALT 52 (H) 01/06/2021   ANIONGAP 11 01/06/2021    CBG (last 3)  Recent Labs    01/05/21 1942 01/06/21 0346 01/06/21 0838  GLUCAP 77 70 78     GFR: Estimated Creatinine Clearance: 14.9 mL/min (A) (by C-G formula based on SCr of 3.47 mg/dL (H)).  Coagulation Profile: No results for input(s): INR, PROTIME in the last 168 hours.  Recent Results (from the past 240 hour(s))  Urine culture     Status: None   Collection Time: 01/02/21  8:53 PM   Specimen: Urine, Random  Result Value Ref Range Status   Specimen Description   Final    URINE, RANDOM Performed at [facility] [address] [place], [place] [address]    Special Requests   Final    NONE Performed at [facility], [place] [address], [place], [place] [address]    Culture   Final    NO GROWTH Performed at [facility], [place] [address], [place], [place] [address]  Report Status 01/04/2021 FINAL  Final  SARS Coronavirus 2 by RT PCR (hospital order, performed in [facility] hospital lab) Nasopharyngeal Nasopharyngeal Swab     Status: Abnormal   Collection Time: 01/02/21  9:00 PM   Specimen: Nasopharyngeal Swab  Result Value Ref Range Status   SARS Coronavirus 2 POSITIVE (A) NEGATIVE Final    Comment: CRITICAL RESULT CALLED TO, READ BACK BY AND VERIFIED WITH: DR Regenia Skeeter AT 2245 01/02/21 CRUICKSHANK A (NOTE) SARS-CoV-2 target nucleic acids are DETECTED  SARS-CoV-2 RNA is generally detectable in upper respiratory specimens  during the acute phase of infection.  Positive results are indicative  of the presence of the identified virus, but do not rule out bacterial infection or  co-infection with other pathogens not detected by the test.  Clinical correlation with patient history and  other diagnostic information is necessary to determine patient infection status.  The expected result is negative.  Fact Sheet for Patients:   StrictlyIdeas.no   Fact Sheet for Healthcare Providers:   BankingDealers.co.za    This test is not yet approved or cleared by the [place] FDA and  has been authorized for detection and/or diagnosis of SARS-CoV-2 by FDA under an Emergency Use Authorization (EUA).  This EUA will remain in effect  (meaning this test can be used) for the duration of  the COVID-19 declaration under Section 564(b)(1) of the Act, 21 U.S.C. section 360-bbb-3(b)(1), unless the authorization is terminated or revoked sooner.  Performed at [facility], [place] [address], [place], [place] [address]   MRSA PCR Screening     Status: None   Collection Time: 01/03/21 10:02 PM   Specimen: Nasal Mucosa; Nasopharyngeal  Result Value Ref Range Status   MRSA by PCR NEGATIVE NEGATIVE Final    Comment:        The GeneXpert MRSA Assay (FDA approved for NASAL specimens only), is one component of a comprehensive MRSA colonization surveillance program. It is not intended to diagnose MRSA infection nor to guide or monitor treatment for MRSA infections. Performed at [facility], [place] [address], [place], [place] [address]   Culture, blood (routine x 2)     Status: None (Preliminary result)   Collection Time: 01/03/21 10:15 PM   Specimen: BLOOD  Result Value Ref Range Status   Specimen Description   Final    BLOOD Performed at [facility] [address], [place], [place] [address]    Special Requests   Final    BOTTLES DRAWN AEROBIC ONLY Performed at [facility] [address], [place], [place] [address]    Culture   Final    NO GROWTH 1  DAY Performed at [facility], [place] [address], [place], [place] [address]    Report Status PENDING  Incomplete  Culture, blood (routine x 2)     Status: None (Preliminary result)   Collection Time: 01/03/21 10:24 PM   Specimen: BLOOD RIGHT HAND  Result Value Ref Range Status   Specimen Description   Final    BLOOD RIGHT HAND Performed at [facility] [address], [place], [place] [address]    Special Requests   Final    BOTTLES DRAWN AEROBIC AND ANAEROBIC Blood Culture adequate volume Performed at [facility] [address], [place], [place] [address]    Culture   Final    NO GROWTH 1 DAY Performed at [facility], [place] [address]  [place], [place] [address]    Report Status PENDING  Incomplete        Radiology Studies: CT HEAD WO CONTRAST  Result Date: 01/06/2021 CLINICAL DATA:  Female status changes. EXAM: CT HEAD WITHOUT CONTRAST TECHNIQUE: Contiguous axial images were obtained from the base of the skull through the vertex without intravenous contrast. COMPARISON:  01/02/2021 FINDINGS: Brain: There is no evidence for acute hemorrhage, hydrocephalus, mass lesion, or abnormal extra-axial fluid collection. No definite CT evidence for acute infarction. Lacunar infarcts noted right basal ganglia. Encephalomalacia in both cerebellar hemispheres compatible with old infarcts. Diffuse loss of parenchymal volume is consistent with atrophy. Patchy low attenuation in the deep hemispheric and periventricular white matter is nonspecific, but likely reflects chronic microvascular ischemic demyelination. Vascular: No hyperdense vessel or unexpected calcification. Skull: No evidence for fracture. No worrisome lytic or sclerotic lesion. Sinuses/Orbits: Deformity of the left maxillary sinus may be related to previous trauma Visualized portions of the globes and intraorbital fat are unremarkable. Other: None. IMPRESSION: 1. No acute intracranial  abnormality. 2. Atrophy with chronic small vessel white matter ischemic disease. 3. Old bilateral cerebellar infarcts. Electronically Signed   By: Misty Stanley M.D.   On: 01/06/2021 12:26   CT SOFT TISSUE NECK WO CONTRAST  Result Date: 01/06/2021 CLINICAL DATA:  Mental status change, concern for laryngeal edema EXAM: CT NECK WITHOUT CONTRAST TECHNIQUE: Multidetector CT imaging of the neck was performed following the standard protocol without intravenous contrast. COMPARISON:  None. FINDINGS: Motion artifact is present. Pharynx and larynx: No mass or swelling identified within above limitation. Salivary glands: Unremarkable. Thyroid: Subcentimeter left thyroid nodule for which no further ultrasound follow-up is recommended by current guidelines. Lymph nodes: No enlarged lymph nodes identified. Vascular: Calcified plaque at the common carotid bifurcations. Limited intracranial: Dictated separately. Visualized orbits: Bilateral lens replacements. Mastoids and visualized paranasal sinuses: Presumed prior left maxillary sinus surgery with mucosal thickening. Otherwise minor mucosal thickening. Mastoid air cells are clear. Skeleton: Cervical spine degenerative changes. Upper chest: Refer to dedicated chest imaging. Other: None. IMPRESSION: Motion degraded study. No significant swelling or other acute abnormality identified. Electronically Signed   By: Macy Mis M.D.   On: 01/06/2021 12:34   CT CHEST WO CONTRAST  Result Date: 01/06/2021 CLINICAL DATA:  Inpatient. COVID positive. Abnormal chest radiograph with pleural effusions. EXAM: CT CHEST WITHOUT CONTRAST TECHNIQUE: Multidetector CT imaging of the chest was performed following the standard protocol without IV contrast. COMPARISON:  Chest radiograph from one day prior. FINDINGS: Cardiovascular: Cardiomegaly. Small pericardial effusion/thickening. Two lead right subclavian pacemaker with lead tips in the right atrium and right ventricular apex. Three-vessel  coronary atherosclerosis. Atherosclerotic nonaneurysmal thoracic aorta. Normal caliber pulmonary arteries. Aberrant nonaneurysmal right subclavian artery arising from the distal aortic arch with retroesophageal course. Mediastinum/Nodes: Subcentimeter hypodense bilateral thyroid nodules. Not clinically significant; no follow-up imaging recommended (ref: J Am Coll Radiol. 2015 Feb;12(2): 143-50). Unremarkable esophagus. No pathologically enlarged axillary, mediastinal or hilar lymph nodes, noting limited sensitivity for the detection of hilar adenopathy on this noncontrast study. Lungs/Pleura: No pneumothorax. Moderate dependent bilateral pleural effusions. Moderate passive atelectasis in dependent lungs bilaterally. Fairly uniform ground-glass opacity and mild interlobular septal thickening throughout both lungs. No lung masses or significant pulmonary nodules in the aerated portions of the lungs. Upper abdomen: Simple upper left renal cysts, largest 4.0 cm. Musculoskeletal: No aggressive appearing focal osseous lesions. Moderate anasarca. Mild thoracic spondylosis. IMPRESSION: 1. Spectrum of findings most compatible with congestive heart failure. Cardiomegaly. Fairly uniform ground-glass opacity and mild  interlobular septal thickening throughout both lungs, compatible with mild cardiogenic pulmonary edema. Moderate dependent bilateral pleural effusions. Moderate anasarca. 2. Small pericardial effusion/thickening. 3. Three-vessel coronary atherosclerosis. 4. Aberrant right subclavian artery. 5. Aortic Atherosclerosis (ICD10-I70.0). Electronically Signed   By: Ilona Sorrel M.D.   On: 01/06/2021 12:55   DG CHEST PORT 1 VIEW  Result Date: 01/05/2021 CLINICAL DATA:  [place] EXAM: PORTABLE CHEST 1 VIEW COMPARISON:  01/04/2021 FINDINGS: Cardiomegaly with increased interstitial markings, favoring mild interstitial edema. Small bilateral pleural effusions, left greater than right. This appearance is similar to the prior.  No pneumothorax. Right subclavian pacemaker. IMPRESSION: Cardiomegaly with mild interstitial edema and small bilateral pleural effusions, similar to the prior. Electronically Signed   By: Julian Hy M.D.   On: 01/05/2021 14:04        Scheduled Meds: . atorvastatin  80 mg Oral Daily  . carvedilol  25 mg Oral BID WC  . chlorhexidine  15 mL Mouth Rinse BID  . Chlorhexidine Gluconate Cloth  6 each Topical Q0600  . clopidogrel  75 mg Oral Daily  . diphenhydrAMINE  50 mg Intravenous Q8H  . dorzolamide-timolol  1 drop Both Eyes BID  . ferrous sulfate  325 mg Oral QODAY  . hydrALAZINE  25 mg Oral BID  . insulin aspart  0-9 Units Subcutaneous Q4H  . mouth rinse  15 mL Mouth Rinse q12n4p  . methylPREDNISolone (SOLU-MEDROL) injection  80 mg Intravenous Q6H  . pantoprazole  40 mg Oral Daily   Continuous Infusions: . [START ON 01/07/2021] ceFEPime (MAXIPIME) IV    . dextrose Stopped (01/05/21 1001)  . famotidine (PEPCID) IV       LOS: 3 days     Cordelia Poche, MD [facility] 01/06/2021, 1:18 PM  If 7PM-7AM, please contact night-coverage [email or web address]

## 2021-01-06 NOTE — Progress Notes (Signed)
OT Cancellation Note and Discharge  Patient Details Name: Desiree Pollard MRN: [id number] DOB: Nov 24, 1946   Cancelled Treatment:    Reason Eval/Treat Not Completed: Medical issues which prohibited therapy. Pt remains lethargic today in chat texting with RN. We have attempted eval x3 days with pt remaining too lethargic to initiate eval. We will sign off and await new order as appropriate.  [place], OTR/L Acute Rehab Services Pager [phone] Office [phone]     Almon Register 01/06/2021, 1:43 PM

## 2021-01-06 NOTE — Progress Notes (Signed)
SLP Cancellation Note  Patient Details Name: Makaylia Hewett MRN: [id number] DOB: 1946-03-18   Cancelled treatment:       Reason Eval/Treat Not Completed: Patient's level of consciousness. Patient is very lethargic per RN. SLP will attempt next date to check for readiness for patient to safely trial PO's.  [name], MA, CCC-SLP Speech Therapy

## 2021-01-06 NOTE — Progress Notes (Signed)
MD Nettey notified for pt's BP of 203/89. Will administer PRN hydralazine and monitor.

## 2021-01-06 NOTE — TOC Progression Note (Signed)
Transition of Care [facility]) - Progression Note    Patient Details  Name: Desiree Pollard MRN: [id number] Date of Birth: 04-17-1946  Transition of [facility]) CM/SW Contact  Leeroy Cha, RN Phone Number: 01/06/2021, 9:16 AM  Clinical Narrative:    Patient remains confused . On room air iv vanco, maxipime, iv d5w at 75cc/hr, \ PLAN: to return to [facility] where patient is from. following for progression, remains on iv abx temp this am 101.1  FL2 sent to [facility] for review and consideration.       Expected Discharge Plan and Services                                                 Social Determinants of Health (SDOH) Interventions    Readmission Risk Interventions No flowsheet data found.

## 2021-01-06 NOTE — Progress Notes (Signed)
Attempted feeding tube insertion in left nare and unable to proceed past nasal passage, resistance met. Pt did not tolerate well. Nasal trumpet in R nare.  Will attempt again later.

## 2021-01-06 NOTE — NC FL2 (Signed)
[place] LEVEL OF CARE SCREENING TOOL     IDENTIFICATION  Patient Name: Desiree Pollard Birthdate: 05-08-1946 Sex: female Admission Date (Current Location): 01/02/2021  [facility] and [place] Number:  Herbalist and Address:  [facility],  [place], [place]      Provider Number: [id number]  Attending Physician Name and Address:  Mariel Aloe, MD  Relative Name and Phone Number:       Current Level of Care: Hospital Recommended Level of Care: [place] Prior Approval Number:    Date Approved/Denied:   PASRR Number: [id number]  Discharge Plan: SNF    Current Diagnoses: Patient Active Problem List   Diagnosis Date Noted  . COVID-19 virus infection 01/02/2021  . Hypernatremia 01/02/2021  . 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 CKD (chronic kidney disease), stage IV ([place]) 11/26/2020  . Acute metabolic encephalopathy 02/58/5277  . Generalized weakness 11/24/2020  . Acute lower UTI 11/24/2020  . Hypothermia 11/24/2020  . ARF (acute renal failure) ([place]) 10/19/2020  . Dizziness 10/19/2020  . Thrombocytopenia ([place]) 10/19/2020  . Chronic diarrhea 09/18/2020  . Loss of weight 09/18/2020  . Dysphagia 09/18/2020  . Insulin dependent type 2 diabetes mellitus ([place]) 06/16/2020  . Hemoglobin A1c less than 7.0% 06/16/2020  . Hyperglycemia 06/16/2020  . Hypertension 06/16/2020  . History of stroke 06/16/2020    Orientation RESPIRATION BLADDER Height & Weight     Self  Normal Incontinent Weight: 79.1 kg Height:  5\' 7"  (170.2 cm)  BEHAVIORAL SYMPTOMS/MOOD NEUROLOGICAL BOWEL NUTRITION STATUS      Continent Diet (regular)  AMBULATORY STATUS COMMUNICATION OF NEEDS Skin   Extensive Assist Verbally Normal                       Personal Care Assistance Level of Assistance  Bathing,Feeding,Dressing Bathing Assistance: Limited assistance Feeding assistance: Limited assistance Dressing  Assistance: Limited assistance     Functional Limitations Info  Sight,Hearing,Speech Sight Info: Adequate Hearing Info: Adequate Speech Info: Adequate    SPECIAL CARE FACTORS FREQUENCY  PT (By licensed PT),OT (By licensed OT)     PT Frequency: 5 x weekly OT Frequency: 5 x weekly            Contractures      Additional Factors Info  Code Status Code Status Info: full             Current Medications (01/06/2021):  This is the current hospital active medication list Current Facility-Administered Medications  Medication Dose Route Frequency Provider Last Rate Last Admin  . acetaminophen (TYLENOL) tablet 650 mg  650 mg Oral Q6H PRN Etta Quill, DO      . atorvastatin (LIPITOR) tablet 80 mg  80 mg Oral Daily Alcario Drought, Jared M, DO      . carvedilol (COREG) tablet 25 mg  25 mg Oral BID WC Jennette Kettle M, DO      . ceFEPIme (MAXIPIME) 2 g in sodium chloride 0.9 % 100 mL IVPB  2 g Intravenous Daily Efraim Kaufmann, RPH 200 mL/hr at 01/06/21 0837 2 g at 01/06/21 0837  . chlorhexidine (PERIDEX) 0.12 % solution 15 mL  15 mL Mouth Rinse BID Mariel Aloe, MD   15 mL at 01/06/21 0830  . Chlorhexidine Gluconate Cloth 2 % PADS 6 each  6 each Topical Q0600 Mariel Aloe, MD   6 each at 01/06/21 0600  . clopidogrel (PLAVIX) tablet 75  mg  75 mg Oral Daily Jennette Kettle M, DO      . dextrose 5 % solution   Intravenous Continuous Mariel Aloe, MD   Stopped at 01/05/21 1001  . dorzolamide-timolol (COSOPT) 22.3-6.8 MG/ML ophthalmic solution 1 drop  1 drop Both Eyes BID Etta Quill, DO   1 drop at 01/06/21 0841  . ferrous sulfate tablet 325 mg  325 mg Oral Gershon Cull, Jared M, DO      . hydrALAZINE (APRESOLINE) injection 10-20 mg  10-20 mg Intravenous Q4H PRN Etta Quill, DO   20 mg at 01/06/21 4076  . hydrALAZINE (APRESOLINE) tablet 25 mg  25 mg Oral BID Jennette Kettle M, DO      . insulin aspart (novoLOG) injection 0-9 Units  0-9 Units Subcutaneous Q4H Mariel Aloe, MD      . MEDLINE mouth rinse  15 mL Mouth Rinse q12n4p Mariel Aloe, MD   15 mL at 01/05/21 1536  . ondansetron (ZOFRAN) tablet 4 mg  4 mg Oral Q6H PRN Etta Quill, DO       Or  . ondansetron [facility]) injection 4 mg  4 mg Intravenous Q6H PRN Etta Quill, DO      . pantoprazole (PROTONIX) EC tablet 40 mg  40 mg Oral Daily Jennette Kettle M, DO      . vancomycin (VANCOCIN) IVPB 1000 mg/200 mL premix  1,000 mg Intravenous Q48H Efraim Kaufmann, [facility]   Stopped at 01/05/21 1636     Discharge Medications: Please see discharge summary for a list of discharge medications.  Relevant Imaging Results:  Relevant Lab Results:   Additional Information SS# [id number] [place]

## 2021-01-06 NOTE — Consult Note (Signed)
NAME:  Desiree Pollard, MRN:  [id number], DOB:  1946-07-12, LOS: 3 ADMISSION DATE:  01/02/2021, CONSULTATION DATE:  1/24 REFERRING MD:  Teryl Lucy, CHIEF COMPLAINT:  Possible airway obstruction   Brief History:  75 year old female with prior stroke resides at skilled nursing facility presented on 1/20 with altered mental status felt possibly in the setting of acute Covid infection further complicated by dehydration and hypernatremia. Critical care asked to evaluate on 1/24 for once again worsening mental status, but also concerned about risk of upper airway obstruction with possible angioedema  History of Present Illness:  75 year old female who resides at skilled nursing facility after her most recent stroke in October 2021. Apparently at baseline has left-sided weakness and expressive aphasia. Last seen by the patient's sister on 1/17 and noted to be making gurgling noises, and mental status was worse. Speech more slurred. EMS was called on 1/20 for worsening mental status. CTA of head was negative for new acute intracranial abnormality initial sodium 154 BUN 42 creatinine 2.98. Incidentally was found to be Covid positive. Admitted with working diagnosis of acute metabolic encephalopathy in the setting of dehydration, hypernatremia, Covid infection, and renal insufficiency. Hospital course: Admitted to the stepdown unit setting, therapeutic interventions included IV hydration with hypotonic solution, IV remdesivir, and supportive care. 1/22: No issues overnight. Sodium slightly improved. Seen by speech therapy but not able to proceed with  full modified barium swallow due to decreased mental status 1/23: Mental status worse once again, had some mild hypoglycemia, sodium had improved. Had some worsening peripheral edema so D5 half saline discontinued, gave IV Lasix. Had new fever so IV vancomycin and cefepime started empirically. 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  Started on half-saline, and supportive care 1/23: Mental status a little worse, got Lasix for peripheral edema, renal function still worsening.  Started on broad-spectrum antibiotics for fever 1/24 remained encephalopathic, hemodynamically stable, concerned about angioedema critical care consulted given concern about airway protection  Consults:   Procedures:    Significant Diagnostic Tests:  1/20 CT of head this showed no acute intracranial abnormality there was bilateral right greater than left cerebral encephalomalacia with prior right basal ganglier lacunar infarct 1/24 CT soft tissue neck CT chest 1/24 CT head 1/24   Micro Data:  SARS coronavirus two PCR 1/20: Positive MRSA PCR 1/21 - 1/20 urine culture negative Blood culture 1/21:  Antimicrobials:   Cefepime 1/23->> Vancomycin 1/23>>>stopped  Interim History / Subjective:  Opens eyes, not following commands.  Generally weak  Objective   Blood pressure (Abnormal) 171/64, pulse 66, temperature 97.9 F (36.6 C), temperature source Oral, resp. rate (Abnormal) 115, height 5' 7"  (1.702 m), weight 79.1 kg, SpO2 97 %.        Intake/Output Summary (Last 24 hours) at 01/06/2021 1127 Last data filed at 01/06/2021 0000 Gross per 24 hour  Intake 447.22 ml  Output 2500 ml  Net -2052.78 ml   Filed Weights   01/03/21 2209 01/05/21 1400 01/06/21 0412  Weight: 78.6 kg 79.1 kg 79.1 kg    Examination: General: Acute on chronically ill-appearing 75 year old female HENT: Tongue is swollen slightly protuberant mucous membranes are dry marked upper airway rhonchi but no current stridor  Lungs: Diffuse rhonchi  no accessory use but mildly tachypneic Cardiovascular: Regular rate and rhythm Abdomen: Soft Extremities: Warm Neuro: Eyes open, not following commands, does track.  Has some left-sided weakness GU: Due to void  Resolved Hospital Problem list     Assessment & Plan:   Ineffective airway clearance/ineffective cough, with concern for evolving angioedema -Etiology not clear at this point, was started on antibiotics about 24 hours ago but reviewing her medication records she is seen beta-lactams in the past without incidence -I do not see where she is received vancomycin or remdesivir in the past -CRP did bump ?  Plan Completed remdesivir will ensure it is off her profile Discontinue vancomycin IV steroids, agree with H2 blockade in the form of Pepcid and Benadryl Send sed rate and complement C4 Place nasal trumpet F/u CT imaging of chest and soft tissue of neck Get ABG NPO  Acute metabolic encephalopathy superimposed on prior stroke. Seems as though mostly 2/2 COVID but also think complicated by dehydration, hypernatremia, renal failure and mild NAG metabolic acidosis.  Plan Agree w/ repeat CT and EEG Checking abg Agree w/ free water replacement  Repeat ammonia level  Cont asa and plavix for secondary stroke prevention  Dysphagia  -2/2 above. Not able to safely take POs Plan Aspiration precautions NPO  Will need to place small bore Feeding tube nasally for meds   Fever w/out leukocytosis. She does not meet SIRS parameters.  Procal negative  Plan Cont IV hydration  Day 2 cefepime  Await pending cultures  Eval CT chest she is aspiration risk   Acute on chronic renal failure (CKD stage IV) -looks like exacerbated by l.asix  Plan Cont hydration efforts Serial chemistries Renal US Place foley cath Strict I&O  Fluid and electrolyte imbalance: hypernatremia (worse after lasix), hyperchloremia, mild NAGMA has anasarca 2/2 (low albumin stores) Plan Cont free water  replacement  Dc any NaCl Ck abg-->low threshold for bicarb replacement if gets worse   DM Plan ssi   Covid Plan Completed 5 d remdesivir  Monitor   Thrombocytopenia -stable Plan Trend cbc   Best practice (evaluated daily)  Diet: NPO-->Place panda  Pain/Anxiety/Delirium protocol (if indicated): NA VAP protocol (if indicated): NA DVT prophylaxis: scd GI prophylaxis: H2B Glucose control: ssi Mobility: BR Disposition: SDU  Goals of Care:  Last date of multidisciplinary goals of care discussion:pending per primary  Family and staff present: pending  Summary of discussion: pending  Follow up goals of care discussion due: per primary  Code Status: full code   Labs   CBC: Recent Labs  Lab 01/02/21 1947 01/03/21 0448 01/04/21 0143 01/05/21 0220 01/06/21 0214  WBC 2.5* 2.8* 2.3* 3.9* 4.0  NEUTROABS 1.8 2.2 1.7 2.9 3.0  HGB 8.2* 7.9* 7.8* 7.9* 7.8*  HCT 27.9* 27.3* 27.2* 26.6* 26.6*  MCV 97.2 97.5 98.6 95.7 95.7  PLT 80* 71* 70* 78* 77*    Basic Metabolic Panel: Recent Labs  Lab 01/04/21 1258 01/05/21 0220 01/05/21 1304 01/05/21 1632 01/06/21 0214  NA 148* 148* 149* 150* 150*  K 3.7 3.5 3.7 3.4* 3.6  CL 119* 120* 120* 120* 119*  CO2 21* 21* 19* 21* 20*  GLUCOSE 89 86 84 106* 74  BUN 46* 44* 44* 46* 48*  CREATININE 3.13* 3.14* 3.33* 3.47* 3.47*  CALCIUM 9.0 9.0 9.1 9.1 9.1   GFR: Estimated Creatinine Clearance: 14.9 mL/min (A) (by C-G formula based on SCr of 3.47 mg/dL (H)). Recent Labs  Lab 01/02/21 1947 01/02/21 2258 01/03/21  0448 01/04/21 0143 01/05/21 0220 01/05/21 1632 01/06/21 0214  PROCALCITON  --  <0.10  --   --   --  <0.10 <0.10  WBC 2.5*  --  2.8* 2.3* 3.9*  --  4.0  LATICACIDVEN 0.6  --   --   --   --  0.7  --     Liver Function Tests: Recent Labs  Lab 01/02/21 1947 01/03/21 0448 01/04/21 0143 01/05/21 0220 01/06/21 0214  AST 67* 58* 48* 40 36  ALT 93* 86* 74* 62* 52*  ALKPHOS 150* 140* 136* 132* 123  BILITOT 0.4 0.5 0.4  0.5 0.6  PROT 6.9 6.6 6.2* 6.0* 5.9*  ALBUMIN 3.0* 2.8* 2.7* 2.6* 2.4*   No results for input(s): LIPASE, AMYLASE in the last 168 hours. Recent Labs  Lab 01/05/21 1304  AMMONIA 36*    ABG No results found for: PHART, PCO2ART, PO2ART, HCO3, TCO2, ACIDBASEDEF, O2SAT   Coagulation Profile: No results for input(s): INR, PROTIME in the last 168 hours.  Cardiac Enzymes: No results for input(s): CKTOTAL, CKMB, CKMBINDEX, TROPONINI in the last 168 hours.  HbA1C: HbA1c, POC (prediabetic range)  Date/Time Value Ref Range Status  06/10/2020 01:30 PM 6.2 5.7 - 6.4 % Final   HbA1c, POC (controlled diabetic range)  Date/Time Value Ref Range Status  06/10/2020 01:30 PM 6.2 0.0 - 7.0 % Final   HbA1c POC (<> result, manual entry)  Date/Time Value Ref Range Status  06/10/2020 01:30 PM 6.2 4.0 - 5.6 % Final   Hgb A1c MFr Bld  Date/Time Value Ref Range Status  01/02/2021 11:07 PM 5.7 (H) 4.8 - 5.6 % Final    Comment:    (NOTE) Pre diabetes:          5.7%-6.4%  Diabetes:              >6.4%  Glycemic control for   <7.0% adults with diabetes   10/23/2020 03:48 AM 6.1 (H) 4.8 - 5.6 % Final    Comment:    (NOTE) Pre diabetes:          5.7%-6.4%  Diabetes:              >6.4%  Glycemic control for   <7.0% adults with diabetes     CBG: Recent Labs  Lab 01/05/21 1237 01/05/21 1610 01/05/21 1942 01/06/21 0346 01/06/21 0838  GLUCAP 79 87 77 70 78    Review of Systems:   Not able   Past Medical History:  She,  has a past medical history of Breast cancer ([place]), CVA (cerebral vascular accident) ([place]) (11/2018), Dehydration (07/2020), Diabetes mellitus without complication ([place]), Diabetic retinopathy ([place]), Frequent diarrhea (07/2020), Hyperkalemia (07/2020), Hypertensive retinopathy, and Stroke ([place]).   Surgical History:   Past Surgical History:  Procedure Laterality Date  . CATARACT EXTRACTION Right   . MASTECTOMY Bilateral    "2017" Left Mastectomy; "2019" Right  Mastectomy  . PACEMAKER IMPLANT  2017     Social History:   reports that she has quit smoking. She has never used smokeless tobacco. She reports previous alcohol use. She reports that she does not use drugs.   Family History:  Her family history includes Diabetes in her brother and sister; Heart disease in her father; Ovarian cancer in her mother. There is no history of Colon cancer, Esophageal cancer, Pancreatic cancer, Stomach cancer, or Liver disease.   Allergies Allergies  Allergen Reactions  . Amlodipine Nausea And Vomiting    Pt refuses to take due to severe  n/v that began after starting amlodipine and stopped after discontinuation.     Home Medications  Prior to Admission medications   Medication Sig Start Date End Date Taking? Authorizing Provider  atorvastatin (LIPITOR) 80 MG tablet Take 80 mg by mouth daily.  10/13/18  Yes [provider]  carvedilol (COREG) 25 MG tablet Take 25 mg by mouth 2 (two) times daily with a meal.  10/13/18  Yes [provider]  clopidogrel (PLAVIX) 75 MG tablet Take 1 tablet (75 mg total) by mouth daily. 10/24/20  Yes Ghimire, Henreitta Leber, MD  dorzolamide-timolol (COSOPT) 22.3-6.8 MG/ML ophthalmic solution INSTILL 1 DROP INTO BOTH EYES TWICE A DAY Patient taking differently: Place 1 drop into both eyes 2 (two) times daily. 01/14/20  Yes Bernarda Caffey, MD  ferrous sulfate 325 (65 FE) MG tablet Take 325 mg by mouth every other day.   Yes [provider]  furosemide (LASIX) 40 MG tablet Take 40 mg by mouth daily.   Yes [provider]  hydrALAZINE (APRESOLINE) 25 MG tablet Take 1 tablet (25 mg total) by mouth See admin instructions. Take 50 mg by mouth TID. Patient taking differently: Take 25 mg by mouth in the morning and at bedtime. 11/26/20  Yes Samuella Cota, MD  insulin lispro (HUMALOG) 100 UNIT/ML injection Inject 0-6 Units into the skin 3 (three) times daily. Sliding scale :  70-150= 0 units 151-200= 1  unit 201-250=  2 units 251-300= 3 units 301-350= 4 units 351-400= 5 units Over 400 = 6 units and call MD   Yes [provider]  loperamide (IMODIUM A-D) 2 MG tablet Take 1 tablet (2 mg total) by mouth 4 (four) times daily as needed for diarrhea or loose stools. 08/09/20  Yes [name], FNP  pantoprazole (PROTONIX) 40 MG tablet Take 1 tablet (40 mg total) by mouth daily. 10/24/20  Yes Ghimire, Henreitta Leber, MD  Accu-Chek FastClix Lancets MISC 1 each by Other route as directed. To test blood glucose daily. 05/24/19   [provider]  Blood Glucose Monitoring Suppl (FIFTY50 GLUCOSE METER 2.0) w/Device KIT 1 each by Other route See admin instructions. Use to check blood sugars 08/31/18   [provider]  Insulin Pen Needle (FIFTY50 PEN NEEDLES) 32G X 4 MM MISC 1 each by Other route See admin instructions. Use as instructed. 11/17/16   [provider]     Critical care time: 32 min   Erick Colace ACNP-BC [place] Pager # [phone] OR # [phone] if no answer

## 2021-01-07 ENCOUNTER — Inpatient Hospital Stay (HOSPITAL_COMMUNITY)
Admit: 2021-01-07 | Discharge: 2021-01-07 | Disposition: A | Discharge status: Home / Self Care | Payer: Medicare HMO | Attending: Family Medicine | Admitting: Family Medicine

## 2021-01-07 ENCOUNTER — Inpatient Hospital Stay (HOSPITAL_COMMUNITY): Payer: Medicare HMO

## 2021-01-07 DIAGNOSIS — E119 Type 2 diabetes mellitus without complications: Secondary | ICD-10-CM | POA: Diagnosis not present

## 2021-01-07 DIAGNOSIS — I361 Nonrheumatic tricuspid (valve) insufficiency: Secondary | ICD-10-CM | POA: Diagnosis not present

## 2021-01-07 DIAGNOSIS — N184 Chronic kidney disease, stage 4 (severe): Secondary | ICD-10-CM

## 2021-01-07 DIAGNOSIS — I5032 Chronic diastolic (congestive) heart failure: Secondary | ICD-10-CM | POA: Diagnosis not present

## 2021-01-07 DIAGNOSIS — U071 COVID-19: Principal | ICD-10-CM

## 2021-01-07 DIAGNOSIS — R220 Localized swelling, mass and lump, head: Secondary | ICD-10-CM

## 2021-01-07 DIAGNOSIS — G9341 Metabolic encephalopathy: Secondary | ICD-10-CM | POA: Diagnosis not present

## 2021-01-07 DIAGNOSIS — I340 Nonrheumatic mitral (valve) insufficiency: Secondary | ICD-10-CM

## 2021-01-07 DIAGNOSIS — Z794 Long term (current) use of insulin: Secondary | ICD-10-CM

## 2021-01-07 LAB — COMPREHENSIVE METABOLIC PANEL
ALT: 52 U/L — ABNORMAL HIGH (ref 0–44)
AST: 30 U/L (ref 15–41)
Albumin: 2.7 g/dL — ABNORMAL LOW (ref 3.5–5.0)
Alkaline Phosphatase: 151 U/L — ABNORMAL HIGH (ref 38–126)
Anion gap: 13 (ref 5–15)
BUN: 57 mg/dL — ABNORMAL HIGH (ref 8–23)
CO2: 21 mmol/L — ABNORMAL LOW (ref 22–32)
Calcium: 9.3 mg/dL (ref 8.9–10.3)
Chloride: 118 mmol/L — ABNORMAL HIGH (ref 98–111)
Creatinine, Ser: 3.33 mg/dL — ABNORMAL HIGH (ref 0.44–1.00)
GFR, Estimated: 14 mL/min — ABNORMAL LOW (ref >60)
Glucose, Bld: 135 mg/dL — ABNORMAL HIGH (ref 70–99)
Potassium: 4 mmol/L (ref 3.5–5.1)
Sodium: 152 mmol/L — ABNORMAL HIGH (ref 135–145)
Total Bilirubin: 0.7 mg/dL (ref 0.3–1.2)
Total Protein: 7 g/dL (ref 6.5–8.1)

## 2021-01-07 LAB — BASIC METABOLIC PANEL
Anion gap: 15 (ref 5–15)
BUN: 67 mg/dL — ABNORMAL HIGH (ref 8–23)
CO2: 20 mmol/L — ABNORMAL LOW (ref 22–32)
Calcium: 8.8 mg/dL — ABNORMAL LOW (ref 8.9–10.3)
Chloride: 115 mmol/L — ABNORMAL HIGH (ref 98–111)
Creatinine, Ser: 4.24 mg/dL — ABNORMAL HIGH (ref 0.44–1.00)
GFR, Estimated: 10 mL/min — ABNORMAL LOW (ref >60)
Glucose, Bld: 184 mg/dL — ABNORMAL HIGH (ref 70–99)
Potassium: 3.7 mmol/L (ref 3.5–5.1)
Sodium: 150 mmol/L — ABNORMAL HIGH (ref 135–145)

## 2021-01-07 LAB — ECHOCARDIOGRAM COMPLETE
Area-P 1/2: 3.21 cm2
Height: 67 in
MV M vel: 5.73 m/s
MV Peak grad: 131.3 mmHg
Radius: 0.5 cm
S' Lateral: 2.86 cm
Weight: 2790.14 oz

## 2021-01-07 LAB — GLUCOSE, CAPILLARY
Glucose-Capillary: 117 mg/dL — ABNORMAL HIGH (ref 70–99)
Glucose-Capillary: 119 mg/dL — ABNORMAL HIGH (ref 70–99)
Glucose-Capillary: 133 mg/dL — ABNORMAL HIGH (ref 70–99)
Glucose-Capillary: 140 mg/dL — ABNORMAL HIGH (ref 70–99)
Glucose-Capillary: 155 mg/dL — ABNORMAL HIGH (ref 70–99)
Glucose-Capillary: 168 mg/dL — ABNORMAL HIGH (ref 70–99)

## 2021-01-07 LAB — PROCALCITONIN: Procalcitonin: <0.1 ng/mL

## 2021-01-07 LAB — CBC WITH DIFFERENTIAL/PLATELET
Abs Immature Granulocytes: 0.08 10*3/uL — ABNORMAL HIGH (ref 0.00–0.07)
Basophils Absolute: 0 10*3/uL (ref 0.0–0.1)
Basophils Relative: 1 %
Eosinophils Absolute: 0 10*3/uL (ref 0.0–0.5)
Eosinophils Relative: 0 %
HCT: 32.4 % — ABNORMAL LOW (ref 36.0–46.0)
Hemoglobin: 9.7 g/dL — ABNORMAL LOW (ref 12.0–15.0)
Immature Granulocytes: 2 %
Lymphocytes Relative: 10 %
Lymphs Abs: 0.5 10*3/uL — ABNORMAL LOW (ref 0.7–4.0)
MCH: 28.8 pg (ref 26.0–34.0)
MCHC: 29.9 g/dL — ABNORMAL LOW (ref 30.0–36.0)
MCV: 96.1 fL (ref 80.0–100.0)
Monocytes Absolute: 0 10*3/uL — ABNORMAL LOW (ref 0.1–1.0)
Monocytes Relative: 1 %
Neutro Abs: 4.2 10*3/uL (ref 1.7–7.7)
Neutrophils Relative %: 86 %
Platelets: 115 10*3/uL — ABNORMAL LOW (ref 150–400)
RBC: 3.37 MIL/uL — ABNORMAL LOW (ref 3.87–5.11)
RDW: 15.9 % — ABNORMAL HIGH (ref 11.5–15.5)
WBC: 4.8 10*3/uL (ref 4.0–10.5)
nRBC: 1.3 % — ABNORMAL HIGH (ref 0.0–0.2)

## 2021-01-07 LAB — D-DIMER, QUANTITATIVE: D-Dimer, Quant: 1.9 ug/mL-FEU — ABNORMAL HIGH (ref 0.00–0.50)

## 2021-01-07 LAB — C4 COMPLEMENT: Complement C4, Body Fluid: 42 mg/dL — ABNORMAL HIGH (ref 12–38)

## 2021-01-07 LAB — C-REACTIVE PROTEIN: CRP: 4.1 mg/dL — ABNORMAL HIGH (ref <1.0)

## 2021-01-07 IMAGING — DX DG ABDOMEN 1V
1 series · 1 of 1 positions shown · non-contrast
Comparison: Radiograph dated [DATE].

CLINICAL DATA: 74-year-old female status post feeding tube
placement.

EXAM:
ABDOMEN - 1 VIEW

[abdomen kub]
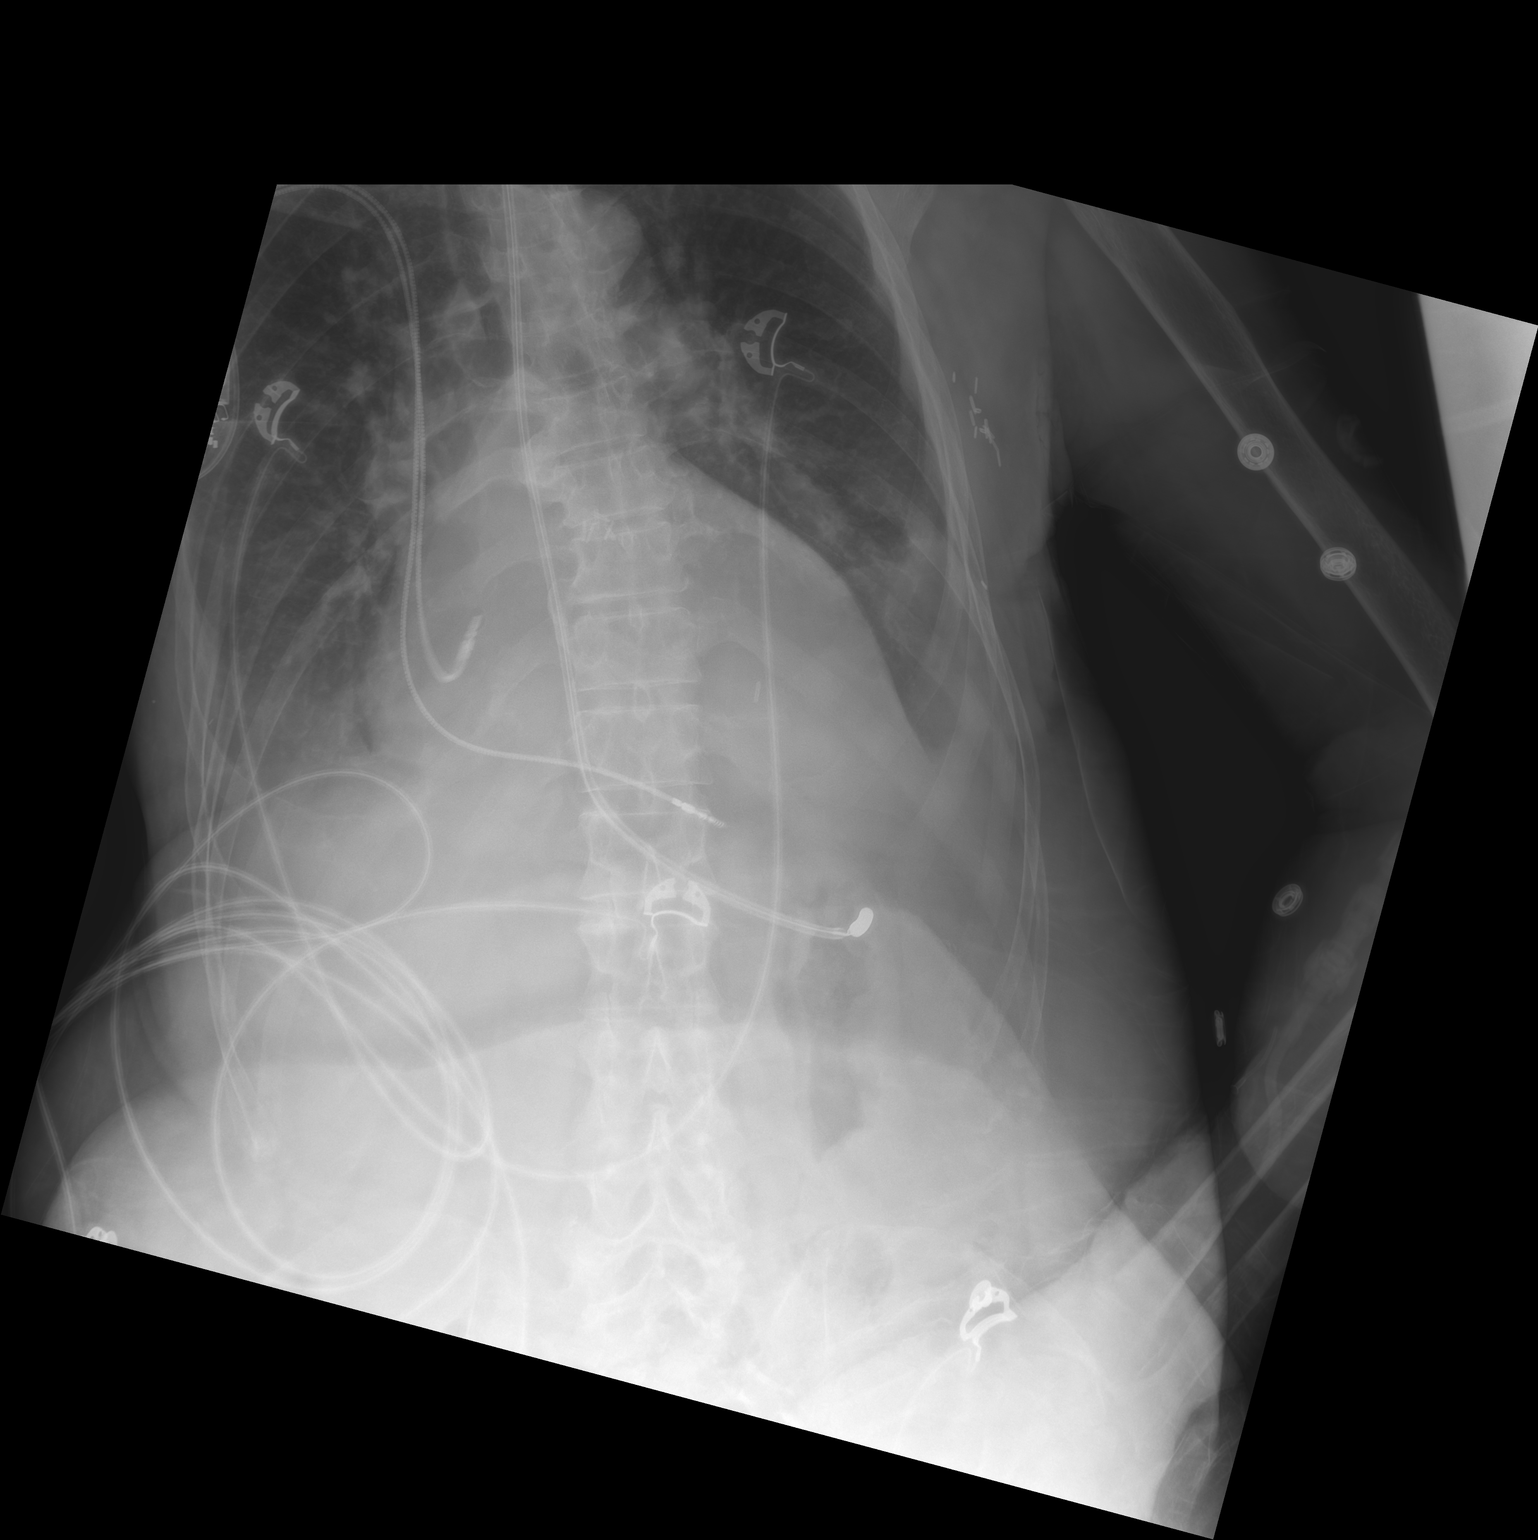

[1 of 1 positions shown; findings below may reference images not displayed]

FINDINGS: Feeding tube with weighted tip in the left upper abdomen likely in
the proximal stomach.

Small bilateral pleural effusions with bibasilar atelectasis.
Pneumonia is not excluded. There is mild cardiomegaly. Pacemaker
wires noted. Degenerative changes of spine.
IMPRESSION: Feeding tube with weighted tip in the proximal stomach.

## 2021-01-07 IMAGING — DX DG ABD PORTABLE 1V
1 series · 1 of 1 positions shown · non-contrast
Comparison: Prior today

CLINICAL DATA: Re-attempted feeding tube placement.

EXAM:
PORTABLE ABDOMEN - 1 VIEW

[abdomen kub]
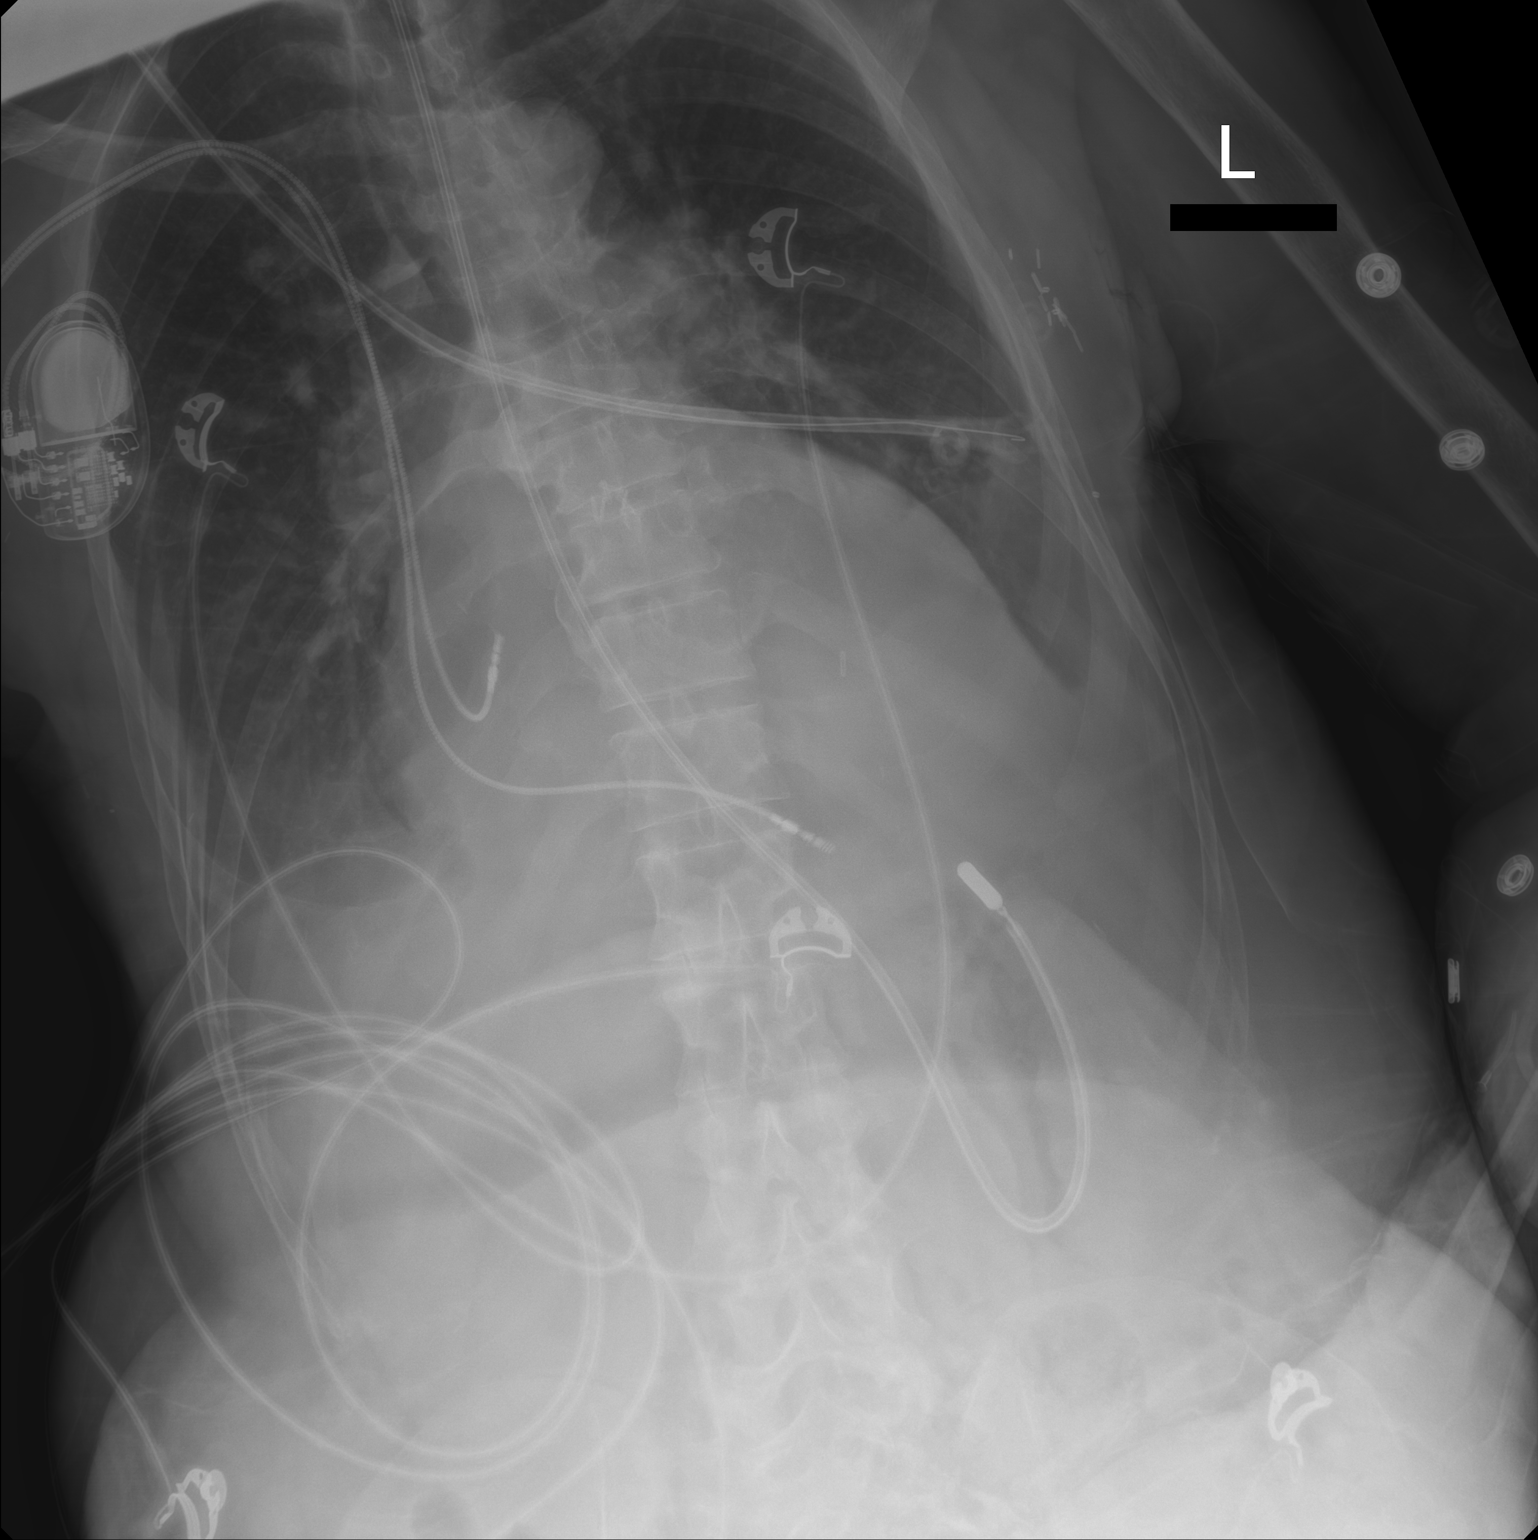

[1 of 1 positions shown; findings below may reference images not displayed]

FINDINGS: Feeding tube is again seen entering the stomach, with tip in the
gastric fundus or proximal body. No dilated bowel loops seen.
Bibasilar pulmonary opacity again noted.
IMPRESSION: Feeding tube tip remains in the proximal stomach.

## 2021-01-07 IMAGING — DX DG ABD PORTABLE 1V
1 series · 1 of 1 positions shown · non-contrast
Comparison: [DATE]

CLINICAL DATA: Nasogastric tube placement

EXAM:
PORTABLE ABDOMEN - 1 VIEW

[abdomen kub]
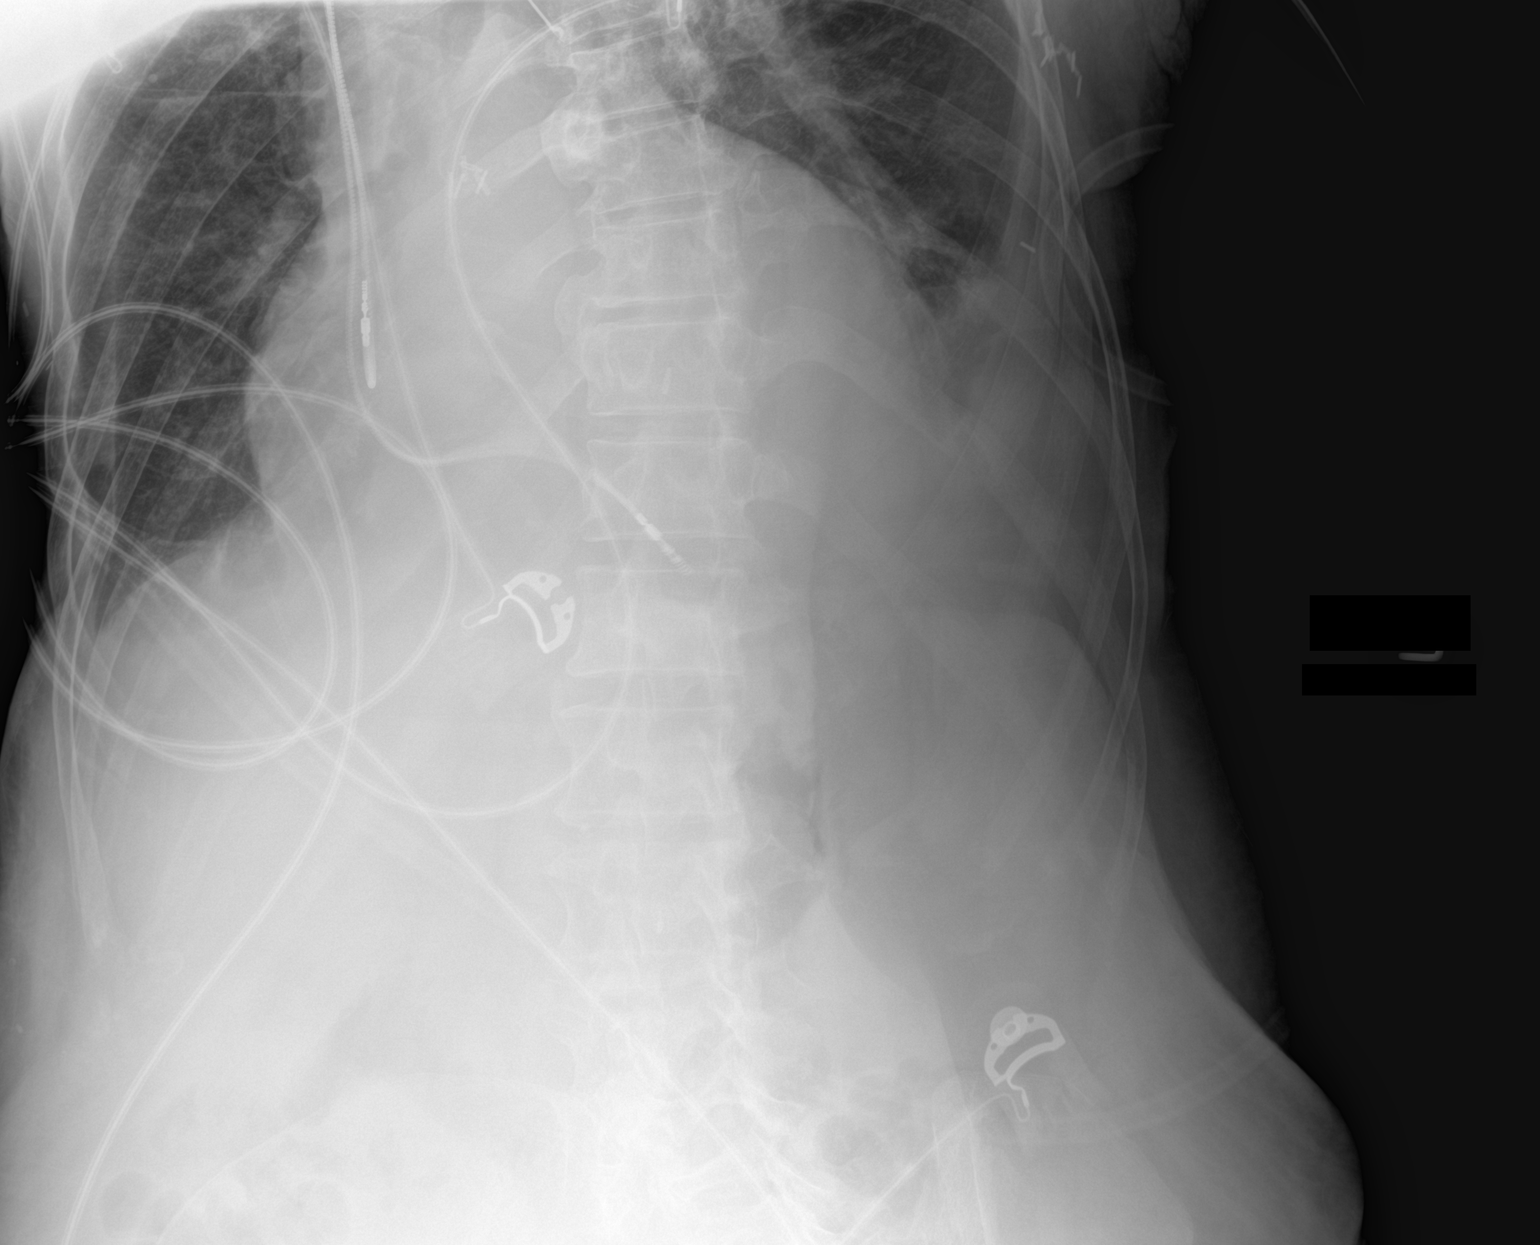

[1 of 1 positions shown; findings below may reference images not displayed]

FINDINGS: No nasogastric tube is identified within the visualized abdomen. A a
radiopacity is seen at the superior margin of the examination which
may represent the tip of the reported nasogastric tube within the
left mainstem bronchus.

Mild to moderate cardiomegaly. Small left pleural effusion. The
visualized abdominal gas pattern is unremarkable. Pelvis excluded
from view.
IMPRESSION: Nasogastric tube not identified within the abdomen. Tip may
potentially be within the left mainstem bronchus. Clinical
correlation is required.

These results will be called to the ordering clinician or
representative by the Radiologist Assistant, and communication
documented in the PACS or [REDACTED].

## 2021-01-07 IMAGING — DX DG ABD PORTABLE 1V
1 series · 1 of 1 positions shown · non-contrast
Comparison: [DATE]

CLINICAL DATA: Feeding tube placement

EXAM:
PORTABLE ABDOMEN - 1 VIEW

[abdomen kub]
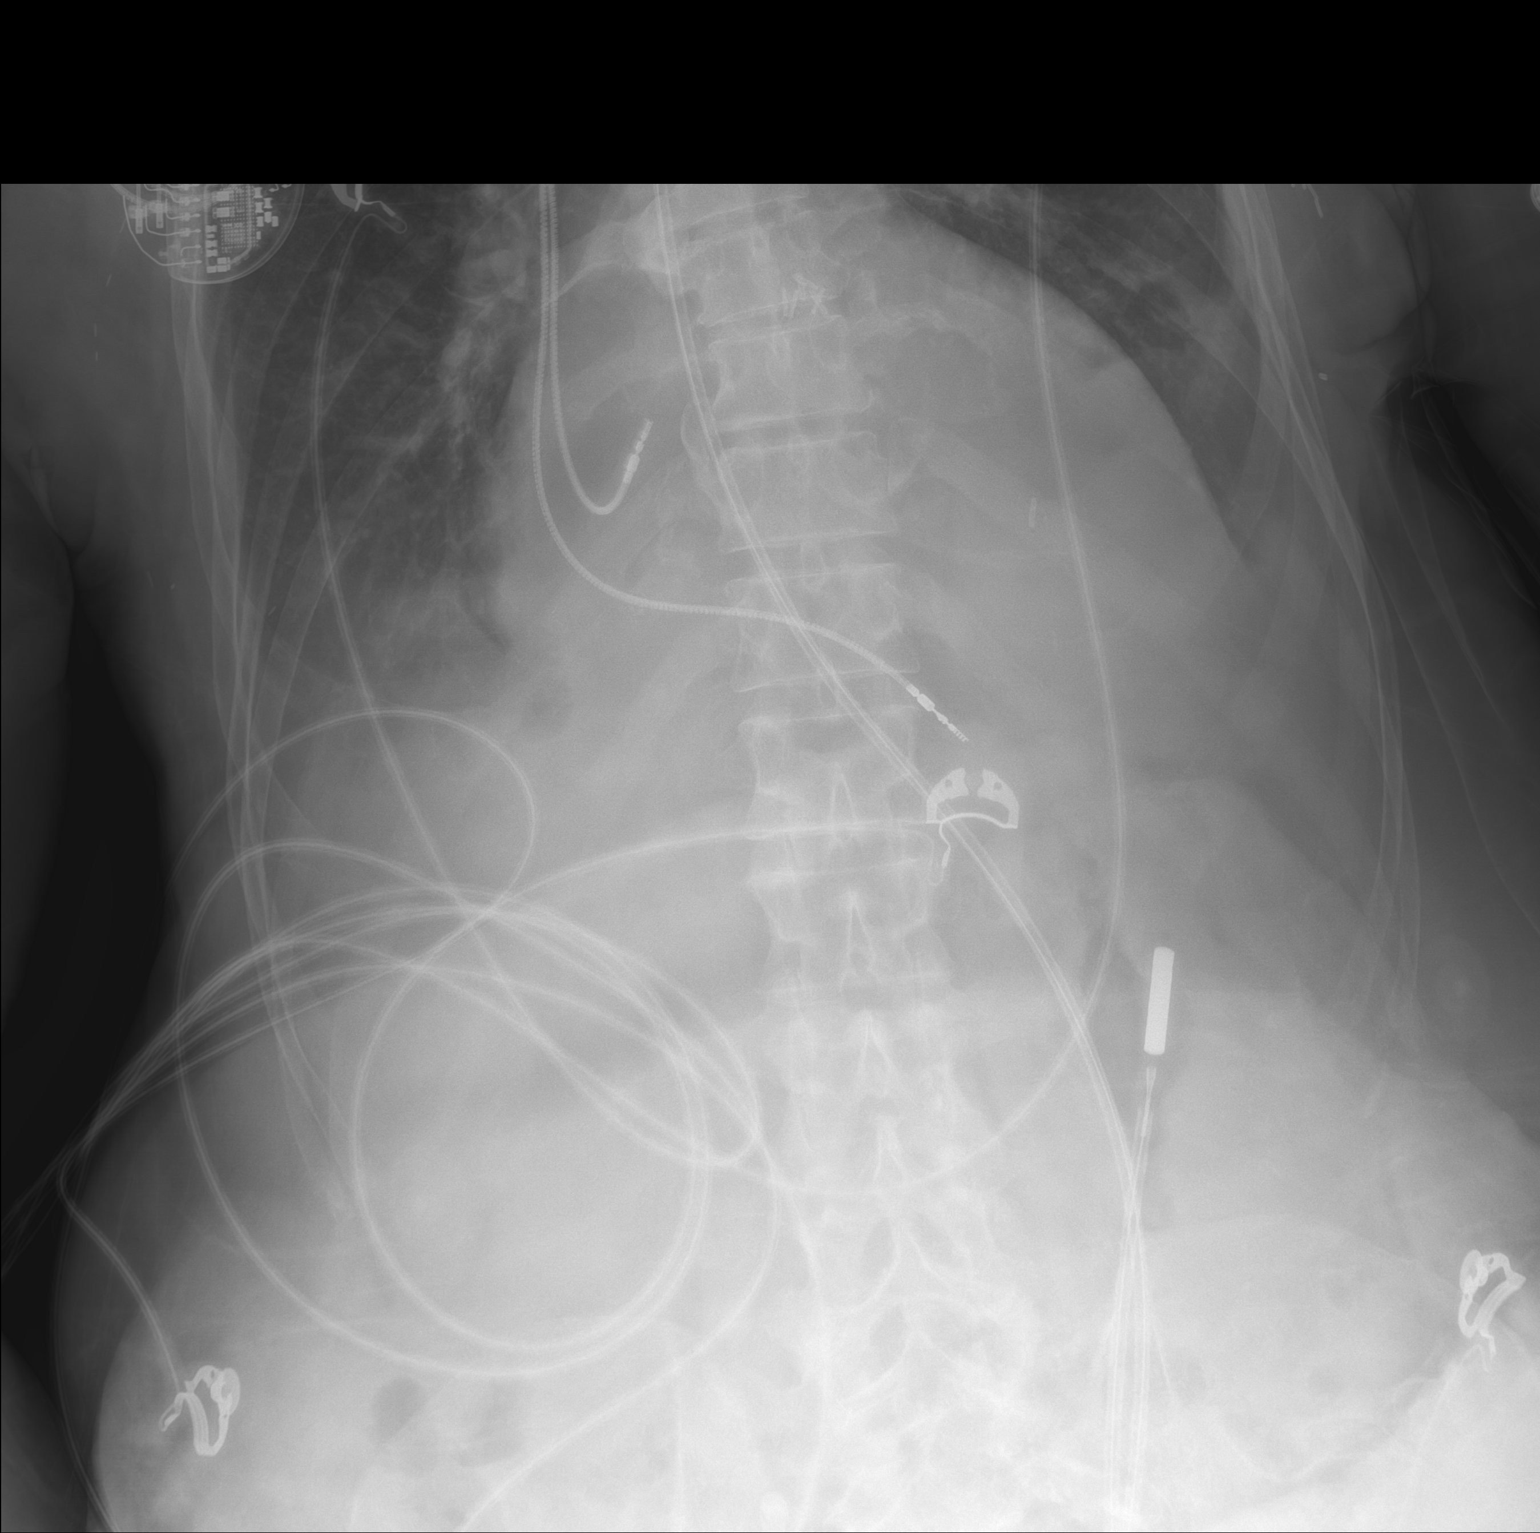

[1 of 1 positions shown; findings below may reference images not displayed]

FINDINGS: Feeding tube loops in the stomach with the tip in the fundus of the
stomach.
IMPRESSION: Feeding tube tip in the fundus of the stomach.

## 2021-01-07 MED ORDER — DEXTROSE 5 % IV SOLN: INTRAVENOUS | Status: DC

## 2021-01-07 MED ORDER — FREE WATER
200.0000 mL | Freq: Four times a day (QID) | Status: DC
  Administered 2021-01-07 – 2021-01-22 (×48): 200 mL

## 2021-01-07 NOTE — Progress Notes (Signed)
*  PRELIMINARY RESULTS* Echocardiogram 2D Echocardiogram has been performed.  Leavy Cella 01/07/2021, 1:45 PM

## 2021-01-07 NOTE — Progress Notes (Signed)
EEG Completed; Results Pending  

## 2021-01-07 NOTE — Progress Notes (Signed)
NAME:  Desiree Pollard, MRN:  [id number], DOB:  [birth year], LOS: 4 ADMISSION DATE:  01/02/2021, CONSULTATION DATE:  1/24 REFERRING MD:  Teryl Lucy, CHIEF COMPLAINT:  Possible airway obstruction   Brief History:  75 year old female with prior stroke resides at skilled nursing facility presented on 1/20 with altered mental status felt possibly in the setting of acute Covid infection further complicated by dehydration and hypernatremia. Critical care asked to evaluate on 1/24 for once again worsening mental status, but also concerned about risk of upper airway obstruction with possible angioedema  History of Present Illness:  75 year old female who resides at skilled nursing facility after her most recent stroke in October 2021. Apparently at baseline has left-sided weakness and expressive aphasia. Last seen by the patient's sister on 1/17 and noted to be making gurgling noises, and mental status was worse. Speech more slurred. EMS was called on 1/20 for worsening mental status. CTA of head was negative for new acute intracranial abnormality initial sodium 154 BUN 42 creatinine 2.98. Incidentally was found to be Covid positive. Admitted with working diagnosis of acute metabolic encephalopathy in the setting of dehydration, hypernatremia, Covid infection, and renal insufficiency. Hospital course: Admitted to the stepdown unit setting, therapeutic interventions included IV hydration with hypotonic solution, IV remdesivir, and supportive care. 1/22: No issues overnight. Sodium slightly improved. Seen by speech therapy but not able to proceed with  full modified barium swallow due to decreased mental status 1/23: Mental status worse once again, had some mild hypoglycemia, sodium had improved. Had some worsening peripheral edema so D5 half saline discontinued, gave IV Lasix. Had new fever so IV vancomycin and cefepime started empirically. 1/24 : PCCM asked to evaluate for multiple reasons including: Persistent  decline in mental status, ongoing metabolic derangements and worsening renal failure, and also concern for possible angioedema  Past Medical History:  Dementia, CKD stage IV, diabetes, prior CVA with aphasia and left-sided deficits at baseline, resides at skilled [facility] Events:  1/20 admitted.  Started on half-saline, and supportive care 1/23: Mental status a little worse, got Lasix for peripheral edema, renal function still worsening.  Started on broad-spectrum antibiotics for fever 1/24 remained encephalopathic, hemodynamically stable, concerned about angioedema critical care consulted given concern about airway protection  Consults:   Procedures:    Significant Diagnostic Tests:  1/20 CT of head this showed no acute intracranial abnormality there was bilateral right greater than left cerebral encephalomalacia with prior right basal ganglier lacunar infarct 1/24 CT soft tissue neck, negative CT chest 1/24: Small pericardial effusion, three-vessel coronary artery arterial sclerosis. Moderate dependent effusions with atelectasis, groundglass bilateral changes. CT head 1/24 negative for acute findings EEG 1/25  Micro Data:  SARS coronavirus two PCR 1/20: Positive MRSA PCR 1/21 - 1/20 urine culture negative Blood culture 1/21:  Antimicrobials:   Cefepime 1/23->> Vancomycin 1/23>>>stopped  Interim History / Subjective:  More somnolent today  Objective   Blood pressure (Abnormal) 169/45, pulse 67, temperature 98.9 F (37.2 C), temperature source Axillary, resp. rate 15, height 5\' 7"  (1.702 m), weight 79.1 kg, SpO2 97 %.        Intake/Output Summary (Last 24 hours) at 01/07/2021 1228 Last data filed at 01/07/2021 0853 Gross per 24 hour  Intake 583.63 ml  Output 1900 ml  Net -1316.37 ml   Filed Weights   01/05/21 1400 01/06/21 0412 01/07/21 0500  Weight: 79.1 kg 79.1 kg 79.1 kg    Examination: General 75 year old female somnolent and  lethargic HEENT normocephalic  atraumatic tongue is less swollen today not protuberant mucous membranes moist nasal trumpet in place Pulmonary: Clear to auscultation room air Cardiac: Regular rate and rhythm Abdomen: Soft nontender Extremities: Warm dry Neuro: Obtunded, minimal response. Does have a cough.  Resolved Hospital Problem list     Assessment & Plan:   Ineffective airway clearance/ineffective cough -was concern for angioedema but clinically this is ruled out or resolved.   Also CT neck re-assuring.  Still mental status concerning.  -abg reviewed and had fairly good gas exchange.  -sed rate only mildly elevated  Plan Dc steroids Cont H2Bs Keep nasal trumpet in place Pulse ox  Keep NPO  Acute metabolic encephalopathy  With hypoactive delirium superimposed on prior stroke. Seems as though mostly 2/2 COVID but also think complicated by dehydration, hypernatremia, renal failure and mild NAG metabolic acidosis.  -f/u CT head neg for acute findings.  Plan  F/u eeg Free water replacement  Cont asa and plavix for secondary stroke prevention I would consider neuro consult  Dysphagia  -2/2 above. Not able to safely take POs Plan NPO Aspiration precautions Needs small bore FT  Fever w/out leukocytosis. She does not meet SIRS parameters.  Procal negative & remains nml Plan Trend fever curve Day 3 cefepime  Await cultures   Acute on chronic renal failure (CKD stage IV) -looks like exacerbated by l.asix  Plan Scr a little better Hold antihypertensives Serial chem  Strict I&O F/u RUS  Fluid and electrolyte imbalance: hypernatremia (worse after lasix), hyperchloremia, mild NAGMA has anasarca 2/2 (low albumin stores) ->Na worse Plan Add back free water. Her Na is contributing here  q 12 chemistries  Hold lasix   DM Plan ssi  Covid-->completed remodels  Plan Monitor   Thrombocytopenia-->improved  -stable Plan Trend cbc  Best practice (evaluated  daily)  Diet: NPO-->Place panda  Pain/Anxiety/Delirium protocol (if indicated): NA VAP protocol (if indicated): NA DVT prophylaxis: scd GI prophylaxis: H2B Glucose control: ssi Mobility: BR Disposition: SDU  Goals of Care:  Last date of multidisciplinary goals of care discussion:pending per primary  Family and staff present: pending  Summary of discussion: pending  Follow up goals of care discussion due: per primary  Code Status: full code    Critical care time: 32 min    Erick Colace ACNP-BC [place] Pager # [phone] OR # [phone] if no answer

## 2021-01-07 NOTE — Progress Notes (Signed)
Physical Therapy Discharge Patient Details Name: Desiree Pollard MRN: [id number] DOB: [birth year] Today's Date: 01/07/2021 Time:  -     Patient discharged from PT services secondary to medical decline - will need to re-order PT to resume therapy services.Remains  Somnolent, unable to participate.   Please see latest therapy progress note for current level of functioning and progress toward goals.      GP     Claretha Cooper 01/07/2021, 1:02 PM [place] Pager [phone] Office [phone]

## 2021-01-07 NOTE — Progress Notes (Signed)
PROGRESS NOTE    Vermont Dipinto  GGE:[id number] DOB: 1946-03-05 DOA: 01/02/2021 PCP: [name], FNP   Brief Narrative: Desiree Pollard is a 75 y.o. female with a history of dementia, CKD stage IV, diabetes mellitus, CVA. Patient presented secondary to altered mental status and found to have hypernatremia. Started on IV fluids. Also found to have COVID-19 and started on Remdesivir. No evidence of pneumonia.   Assessment & Plan:   Principal Problem:   Acute metabolic encephalopathy Active Problems:   Insulin dependent type 2 diabetes mellitus (HCC)   Hypertension   History of stroke   CKD (chronic kidney disease), stage IV ([place])   COVID-19 virus infection   Hypernatremia   Acute metabolic encephalopathy Possibly secondary to hypernatremia and dehydration started on IV fluids. Urine culture obtained on admission however urinalysis does not suggest infection. Mentation worsened again. Admission CT head without abnormalities. No seizure history. With COVID-19 infection, it is possible this could be the driver but will continue to search for alternate causes. Troponin mildly elevated but at baseline. Ammonia normal. Repeat CT head without acute change. Patient unable to get MRI secondary to pacer. Sodium mildly elevated as well. -EEG pending  Possible angioedema Lower lip and tongue appear swollen. New medications include Vancomycin/Cefepime. Received Remdesivir earlier in admission but that has completed. PCCM consulted and not concerned about angioedema. Treated empirically Solumedrol/Pepcid/Benadryl IV. CT neck reassuring.   Hypernatremia Likely secondary to poor oral intake. Sodium of 154 on admission with slight trend down. Started on 1/2 NS IV fluids which were transitioned to D5 water fluids. Fluids discontinued 1/23 secondary to concern for fluid overload. -Serial BMP -NG tube placement for free water administration  COVID-19 infection No evidence of pneumonia. Started  on Remdesivir on admission. CRP normal. Febrile today.  Edema In setting of IV fluids for treatment of hypernatremia. Patient has a history of diastolic heart failure. BNP is elevated. Prior chest x-ray with infiltrate vs edema. CT chest significant for moderate pleural effusions and ?mild cardiogenic pulmonary edema. -Continue Lasix IV diuresis -Daily weights -Echo  Chronic diastolic heart failure Seen on recent Transthoracic Echocardiogram from 10/2020. EF of 60-65%. -Repeat Transthoracic Echocardiogram pending  Leukopenia Secondary to COVID-19 infection with lymphocytopenia. Normal ANC.  AKI on CKD stage IV Baseline creatinine of about 2.6. Creatinine of 2.98 on admission and currently stable. Appears likely secondary to hypovolemia and poor oral intake. IV fluids held secondary to likely volume overload. Creatinine appears to be peaking with IV lasix. Good urine output; not concerned for obstruction at this time. -Continue Lasix 40 mg IV BID  Hypothermia Unknown etiology. COVID-19 infection. Procalcitonin undetectable. No leukocytosis. No signs concerning for infection/sepsis. Urine culture with no growth. Resolved with Bair hugger. -Follow up blood cultures  Fever No specific source except known COVID-19 infection. Holding Vancomycin secondary to concern for angioedema. Procalcitonin negative. -Continue to follow culture results -Cefepime empirically  Anemia of chronic disease Likely secondary to kidney disease. Stable.  Thrombocytopenia  No evidence of bleeding. Likely related to infection. Patient is also on aspirin and Plavix for history of stroke. Stable.  History of stroke On aspirin and Plavix as an outpatient -Continue aspirin/Plavix   DVT prophylaxis: SCDs Code Status:   Code Status: Full Code Family Communication: Called sister again. No response. Disposition Plan: Discharge back to SNF in several days pending improvement of sodium, evaluation for  swallowing, improvement of mentation and workup for possible infection.   Consultants:   None  Procedures:   None  Antimicrobials:  Remdesivir  Vancomycin  Cefepime   Subjective: Lethargic.  Objective: Vitals:   01/07/21 0700 01/07/21 0800 01/07/21 0813 01/07/21 0900  BP:  (!) 175/51  (!) 183/70  Pulse: 71 71  75  Resp: 20 19  17   Temp:   98.9 F (37.2 C)   TempSrc:   Axillary   SpO2: 97% 98%  98%  Weight:      Height:        Intake/Output Summary (Last 24 hours) at 01/07/2021 0950 Last data filed at 01/07/2021 0853 Gross per 24 hour  Intake 583.63 ml  Output 2100 ml  Net -1516.37 ml   Filed Weights   01/05/21 1400 01/06/21 0412 01/07/21 0500  Weight: 79.1 kg 79.1 kg 79.1 kg    Examination:  General exam: Appears calm and comfortable Respiratory system: Diminished. Respiratory effort normal. Cardiovascular system: S1 & S2 heard, RRR. No murmurs, rubs, gallops or clicks. Gastrointestinal system: Abdomen is nondistended, soft and nontender. No organomegaly or masses felt. Normal bowel sounds heard. Central nervous system: Lethargic. Arouses to verbal stimulation but goes back to sleep. Musculoskeletal: 2+ pitting edema of thigh/hip. No calf tenderness Skin: No cyanosis. No rashes    Data Reviewed: I have personally reviewed following labs and imaging studies  CBC Lab Results  Component Value Date   WBC 4.8 01/07/2021   RBC 3.37 (L) 01/07/2021   HGB 9.7 (L) 01/07/2021   HCT 32.4 (L) 01/07/2021   MCV 96.1 01/07/2021   MCH 28.8 01/07/2021   PLT 115 (L) 01/07/2021   MCHC 29.9 (L) 01/07/2021   RDW 15.9 (H) 01/07/2021   LYMPHSABS 0.5 (L) 01/07/2021   MONOABS 0.0 (L) 01/07/2021   EOSABS 0.0 01/07/2021   BASOSABS 0.0 43/32/9518     Last metabolic panel Lab Results  Component Value Date   NA 152 (H) 01/07/2021   K 4.0 01/07/2021   CL 118 (H) 01/07/2021   CO2 21 (L) 01/07/2021   BUN 57 (H) 01/07/2021   CREATININE 3.33 (H) 01/07/2021    GLUCOSE 135 (H) 01/07/2021   GFRNONAA 14 (L) 01/07/2021   GFRAA 20 (L) 08/07/2020   CALCIUM 9.3 01/07/2021   PHOS 3.2 10/21/2020   PROT 7.0 01/07/2021   ALBUMIN 2.7 (L) 01/07/2021   LABGLOB 3.4 08/07/2020   AGRATIO 1.3 08/07/2020   BILITOT 0.7 01/07/2021   ALKPHOS 151 (H) 01/07/2021   AST 30 01/07/2021   ALT 52 (H) 01/07/2021   ANIONGAP 13 01/07/2021    CBG (last 3)  Recent Labs    01/06/21 2307 01/07/21 0415 01/07/21 0734  GLUCAP 114* 117* 119*     GFR: Estimated Creatinine Clearance: 15.6 mL/min (A) (by C-G formula based on SCr of 3.33 mg/dL (H)).  Coagulation Profile: No results for input(s): INR, PROTIME in the last 168 hours.  Recent Results (from the past 240 hour(s))  Urine culture     Status: None   Collection Time: 01/02/21  8:53 PM   Specimen: Urine, Random  Result Value Ref Range Status   Specimen Description   Final    URINE, RANDOM Performed at [facility] [address], [place], [place] [address]    Special Requests   Final    NONE Performed at [facility], [place] [address] [place], [place] [address]    Culture   Final    NO GROWTH Performed at [facility], [place] [address] [place], [place] [address]    Report Status 01/04/2021 FINAL  Final  SARS Coronavirus 2 by RT PCR (hospital order, performed in [facility] hospital lab) Nasopharyngeal Nasopharyngeal Swab     Status: Abnormal   Collection Time: 01/02/21  9:00 PM   Specimen: Nasopharyngeal Swab  Result Value Ref Range Status   SARS Coronavirus 2 POSITIVE (A) NEGATIVE Final    Comment: CRITICAL RESULT CALLED TO, READ BACK BY AND VERIFIED WITH: DR Regenia Skeeter AT 2245 01/02/21 [place] A (NOTE) SARS-CoV-2 target nucleic acids are DETECTED  SARS-CoV-2 RNA is generally detectable in upper respiratory specimens  during the acute phase of infection.  Positive results are indicative  of the presence of the identified virus, but do not rule  out bacterial infection or co-infection with other pathogens not detected by the test.  Clinical correlation with patient history and  other diagnostic information is necessary to determine patient infection status.  The expected result is negative.  Fact Sheet for Patients:   StrictlyIdeas.no   Fact Sheet for Healthcare Providers:   BankingDealers.co.za    This test is not yet approved or cleared by the [place] FDA and  has been authorized for detection and/or diagnosis of SARS-CoV-2 by FDA under an Emergency Use Authorization (EUA).  This EUA will remain in effect  (meaning this test can be used) for the duration of  the COVID-19 declaration under Section 564(b)(1) of the Act, 21 U.S.C. section 360-bbb-3(b)(1), unless the authorization is terminated or revoked sooner.  Performed at [facility], [place] [address], [place], [place] [address]   MRSA PCR Screening     Status: None   Collection Time: 01/03/21 10:02 PM   Specimen: Nasal Mucosa; Nasopharyngeal  Result Value Ref Range Status   MRSA by PCR NEGATIVE NEGATIVE Final    Comment:        The GeneXpert MRSA Assay (FDA approved for NASAL specimens only), is one component of a comprehensive MRSA colonization surveillance program. It is not intended to diagnose MRSA infection nor to guide or monitor treatment for MRSA infections. Performed at [facility], [place] [address], [place], [place] [address]   Culture, blood (routine x 2)     Status: None (Preliminary result)   Collection Time: 01/03/21 10:15 PM   Specimen: BLOOD  Result Value Ref Range Status   Specimen Description   Final    BLOOD Performed at [facility] [address], [place], [place] [address]    Special Requests   Final    BOTTLES DRAWN AEROBIC ONLY Performed at [facility] [address], [place], [place] [address]     Culture   Final    NO GROWTH 2 DAYS Performed at [facility] [address] [place], [place] [address]    Report Status PENDING  Incomplete  Culture, blood (routine x 2)     Status: None (Preliminary result)   Collection Time: 01/03/21 10:24 PM   Specimen: BLOOD RIGHT HAND  Result Value Ref Range Status   Specimen Description   Final    BLOOD RIGHT HAND Performed at [facility] [address] [place], [place] [address]    Special Requests   Final    BOTTLES DRAWN AEROBIC AND ANAEROBIC Blood Culture adequate volume Performed at [facility] [address] [place], [place] [address]    Culture   Final    NO GROWTH 2 DAYS Performed at [facility] [address] [place], [place] [address]  Report Status PENDING  Incomplete        Radiology Studies: CT HEAD WO CONTRAST  Result Date: 01/06/2021 CLINICAL DATA:  Female status changes. EXAM: CT HEAD WITHOUT CONTRAST TECHNIQUE: Contiguous axial images were obtained from the base of the skull through the vertex without intravenous contrast. COMPARISON:  01/02/2021 FINDINGS: Brain: There is no evidence for acute hemorrhage, hydrocephalus, mass lesion, or abnormal extra-axial fluid collection. No definite CT evidence for acute infarction. Lacunar infarcts noted right basal ganglia. Encephalomalacia in both cerebellar hemispheres compatible with old infarcts. Diffuse loss of parenchymal volume is consistent with atrophy. Patchy low attenuation in the deep hemispheric and periventricular white matter is nonspecific, but likely reflects chronic microvascular ischemic demyelination. Vascular: No hyperdense vessel or unexpected calcification. Skull: No evidence for fracture. No worrisome lytic or sclerotic lesion. Sinuses/Orbits: Deformity of the left maxillary sinus may be related to previous trauma Visualized portions of the globes and intraorbital fat are unremarkable. Other: None.  IMPRESSION: 1. No acute intracranial abnormality. 2. Atrophy with chronic small vessel white matter ischemic disease. 3. Old bilateral cerebellar infarcts. Electronically Signed   By: Misty Stanley M.D.   On: 01/06/2021 12:26   CT SOFT TISSUE NECK WO CONTRAST  Result Date: 01/06/2021 CLINICAL DATA:  Mental status change, concern for laryngeal edema EXAM: CT NECK WITHOUT CONTRAST TECHNIQUE: Multidetector CT imaging of the neck was performed following the standard protocol without intravenous contrast. COMPARISON:  None. FINDINGS: Motion artifact is present. Pharynx and larynx: No mass or swelling identified within above limitation. Salivary glands: Unremarkable. Thyroid: Subcentimeter left thyroid nodule for which no further ultrasound follow-up is recommended by current guidelines. Lymph nodes: No enlarged lymph nodes identified. Vascular: Calcified plaque at the common carotid bifurcations. Limited intracranial: Dictated separately. Visualized orbits: Bilateral lens replacements. Mastoids and visualized paranasal sinuses: Presumed prior left maxillary sinus surgery with mucosal thickening. Otherwise minor mucosal thickening. Mastoid air cells are clear. Skeleton: Cervical spine degenerative changes. Upper chest: Refer to dedicated chest imaging. Other: None. IMPRESSION: Motion degraded study. No significant swelling or other acute abnormality identified. Electronically Signed   By: Macy Mis M.D.   On: 01/06/2021 12:34   CT CHEST WO CONTRAST  Result Date: 01/06/2021 CLINICAL DATA:  Inpatient. COVID positive. Abnormal chest radiograph with pleural effusions. EXAM: CT CHEST WITHOUT CONTRAST TECHNIQUE: Multidetector CT imaging of the chest was performed following the standard protocol without IV contrast. COMPARISON:  Chest radiograph from one day prior. FINDINGS: Cardiovascular: Cardiomegaly. Small pericardial effusion/thickening. Two lead right subclavian pacemaker with lead tips in the right atrium and  right ventricular apex. Three-vessel coronary atherosclerosis. Atherosclerotic nonaneurysmal thoracic aorta. Normal caliber pulmonary arteries. Aberrant nonaneurysmal right subclavian artery arising from the distal aortic arch with retroesophageal course. Mediastinum/Nodes: Subcentimeter hypodense bilateral thyroid nodules. Not clinically significant; no follow-up imaging recommended (ref: J Am Coll Radiol. 2015 Feb;12(2): 143-50). Unremarkable esophagus. No pathologically enlarged axillary, mediastinal or hilar lymph nodes, noting limited sensitivity for the detection of hilar adenopathy on this noncontrast study. Lungs/Pleura: No pneumothorax. Moderate dependent bilateral pleural effusions. Moderate passive atelectasis in dependent lungs bilaterally. Fairly uniform ground-glass opacity and mild interlobular septal thickening throughout both lungs. No lung masses or significant pulmonary nodules in the aerated portions of the lungs. Upper abdomen: Simple upper left renal cysts, largest 4.0 cm. Musculoskeletal: No aggressive appearing focal osseous lesions. Moderate anasarca. Mild thoracic spondylosis. IMPRESSION: 1. Spectrum of findings most compatible with congestive heart failure. Cardiomegaly. Fairly uniform ground-glass opacity and mild interlobular septal thickening throughout both lungs,  compatible with mild cardiogenic pulmonary edema. Moderate dependent bilateral pleural effusions. Moderate anasarca. 2. Small pericardial effusion/thickening. 3. Three-vessel coronary atherosclerosis. 4. Aberrant right subclavian artery. 5. Aortic Atherosclerosis (ICD10-I70.0). Electronically Signed   By: Ilona Sorrel M.D.   On: 01/06/2021 12:55   DG CHEST PORT 1 VIEW  Result Date: 01/05/2021 CLINICAL DATA:  [place] EXAM: PORTABLE CHEST 1 VIEW COMPARISON:  01/04/2021 FINDINGS: Cardiomegaly with increased interstitial markings, favoring mild interstitial edema. Small bilateral pleural effusions, left greater than right. This  appearance is similar to the prior. No pneumothorax. Right subclavian pacemaker. IMPRESSION: Cardiomegaly with mild interstitial edema and small bilateral pleural effusions, similar to the prior. Electronically Signed   By: Julian Hy M.D.   On: 01/05/2021 14:04   DG Abd Portable 1V  Result Date: 01/07/2021 CLINICAL DATA:  Nasogastric tube placement EXAM: PORTABLE ABDOMEN - 1 VIEW COMPARISON:  04/25/2020 FINDINGS: No nasogastric tube is identified within the visualized abdomen. A a radiopacity is seen at the superior margin of the examination which may represent the tip of the reported nasogastric tube within the left mainstem bronchus. Mild to moderate cardiomegaly. Small left pleural effusion. The visualized abdominal gas pattern is unremarkable. Pelvis excluded from view. IMPRESSION: Nasogastric tube not identified within the abdomen. Tip may potentially be within the left mainstem bronchus. Clinical correlation is required. These results will be called to the ordering clinician or representative by the Radiologist Assistant, and communication documented in the PACS or [employer]. Electronically Signed   By: Fidela Salisbury MD   On: 01/07/2021 04:58        Scheduled Meds: . atorvastatin  80 mg Oral Daily  . carvedilol  25 mg Oral BID WC  . chlorhexidine  15 mL Mouth Rinse BID  . Chlorhexidine Gluconate Cloth  6 each Topical Q0600  . clopidogrel  75 mg Oral Daily  . diphenhydrAMINE  50 mg Intravenous Q8H  . dorzolamide-timolol  1 drop Both Eyes BID  . ferrous sulfate  325 mg Oral QODAY  . furosemide  40 mg Intravenous BID  . hydrALAZINE  25 mg Oral BID  . insulin aspart  0-9 Units Subcutaneous Q4H  . mouth rinse  15 mL Mouth Rinse q12n4p  . methylPREDNISolone (SOLU-MEDROL) injection  80 mg Intravenous Q6H  . pantoprazole  40 mg Oral Daily   Continuous Infusions: . ceFEPime (MAXIPIME) IV Stopped (01/07/21 0919)  . famotidine (PEPCID) IV Stopped (01/06/21 1704)     LOS: 4  days     Cordelia Poche, MD [facility] 01/07/2021, 9:50 AM  If 7PM-7AM, please contact night-coverage [email or web address]

## 2021-01-07 NOTE — Progress Notes (Signed)
SLP Cancellation Note  Patient Details Name: Desiree Pollard MRN: [id number] DOB: 08-Mar-1946   Cancelled treatment:       Reason Eval/Treat Not Completed: Other (comment);Fatigue/lethargy limiting ability to participate (pt remains lethargic, will continue efforts)  Kathleen Lime, MS [facility] SLP Acute Rehab Services Office [phone] Pager [phone]    [name] 01/07/2021, 9:28 AM

## 2021-01-07 NOTE — Procedures (Signed)
Patient Name: Desiree Pollard  MRN: [id number]  Epilepsy Attending: Lora Havens  Referring Physician/Provider: Dr. Cordelia Poche Date: 01/07/2021 Duration: 23.11 mins  Patient history: 75 year old female with altered mental status. EEG to evaluate for seizures.  Level of alertness:  lethargic  AEDs during EEG study: None  Technical aspects: This EEG study was done with scalp electrodes positioned according to the 10-20 International system of electrode placement. Electrical activity was acquired at a sampling rate of 500Hz  and reviewed with a high frequency filter of 70Hz  and a low frequency filter of 1Hz . EEG data were recorded continuously and digitally stored.   Description: EEG showed continuous generalized 3 to 6 Hz theta-delta slowing.  Hyperventilation and photic stimulation were not performed.     ABNORMALITY -Continuous slow, generalized  IMPRESSION: This study is suggestive of moderate to severe diffuse encephalopathy, nonspecific etiology. No seizures or epileptiform discharges were seen throughout the recording.  Desiree Pollard Barbra Sarks

## 2021-01-08 ENCOUNTER — Encounter (INDEPENDENT_AMBULATORY_CARE_PROVIDER_SITE_OTHER): Payer: Medicare HMO | Admitting: Ophthalmology

## 2021-01-08 DIAGNOSIS — G9341 Metabolic encephalopathy: Secondary | ICD-10-CM | POA: Diagnosis not present

## 2021-01-08 DIAGNOSIS — E870 Hyperosmolality and hypernatremia: Secondary | ICD-10-CM

## 2021-01-08 LAB — GLUCOSE, CAPILLARY
Glucose-Capillary: 138 mg/dL — ABNORMAL HIGH (ref 70–99)
Glucose-Capillary: 142 mg/dL — ABNORMAL HIGH (ref 70–99)
Glucose-Capillary: 145 mg/dL — ABNORMAL HIGH (ref 70–99)
Glucose-Capillary: 159 mg/dL — ABNORMAL HIGH (ref 70–99)
Glucose-Capillary: 161 mg/dL — ABNORMAL HIGH (ref 70–99)
Glucose-Capillary: 165 mg/dL — ABNORMAL HIGH (ref 70–99)
Glucose-Capillary: 75 mg/dL (ref 70–99)
Glucose-Capillary: 83 mg/dL (ref 70–99)
Glucose-Capillary: 83 mg/dL (ref 70–99)

## 2021-01-08 LAB — BASIC METABOLIC PANEL
Anion gap: 13 (ref 5–15)
Anion gap: 9 (ref 5–15)
BUN: 68 mg/dL — ABNORMAL HIGH (ref 8–23)
BUN: 65 mg/dL — ABNORMAL HIGH (ref 8–23)
CO2: 22 mmol/L (ref 22–32)
CO2: 23 mmol/L (ref 22–32)
Calcium: 8.7 mg/dL — ABNORMAL LOW (ref 8.9–10.3)
Calcium: 8.2 mg/dL — ABNORMAL LOW (ref 8.9–10.3)
Chloride: 112 mmol/L — ABNORMAL HIGH (ref 98–111)
Chloride: 111 mmol/L (ref 98–111)
Creatinine, Ser: 4.32 mg/dL — ABNORMAL HIGH (ref 0.44–1.00)
Creatinine, Ser: 4.38 mg/dL — ABNORMAL HIGH (ref 0.44–1.00)
GFR, Estimated: 10 mL/min — ABNORMAL LOW (ref >60)
GFR, Estimated: 10 mL/min — ABNORMAL LOW (ref >60)
Glucose, Bld: 180 mg/dL — ABNORMAL HIGH (ref 70–99)
Glucose, Bld: 147 mg/dL — ABNORMAL HIGH (ref 70–99)
Potassium: 3.6 mmol/L (ref 3.5–5.1)
Potassium: 4.4 mmol/L (ref 3.5–5.1)
Sodium: 147 mmol/L — ABNORMAL HIGH (ref 135–145)
Sodium: 143 mmol/L (ref 135–145)

## 2021-01-08 MED ORDER — FERROUS SULFATE 300 (60 FE) MG/5ML PO SYRP
300.0000 mg | ORAL_SOLUTION | ORAL | Status: DC
  Administered 2021-01-08 – 2021-02-19 (×19): 300 mg
  Filled 2021-01-08 (×22): qty 5

## 2021-01-08 MED ORDER — PANTOPRAZOLE SODIUM 40 MG PO PACK
40.0000 mg | PACK | Freq: Every day | ORAL | Status: DC
  Administered 2021-01-09 – 2021-02-19 (×36): 40 mg
  Filled 2021-01-08 (×42): qty 20

## 2021-01-08 MED ORDER — CARVEDILOL 25 MG PO TABS
25.0000 mg | ORAL_TABLET | Freq: Two times a day (BID) | ORAL | Status: DC
  Administered 2021-01-08 – 2021-01-13 (×11): 25 mg
  Filled 2021-01-08: qty 1
  Filled 2021-01-08: qty 2
  Filled 2021-01-08 (×7): qty 1
  Filled 2021-01-08: qty 2
  Filled 2021-01-08 (×2): qty 1

## 2021-01-08 MED ORDER — ONDANSETRON HCL 4 MG/2ML IJ SOLN: 4.0000 mg | Freq: Four times a day (QID) | INTRAMUSCULAR | Status: DC | PRN

## 2021-01-08 MED ORDER — ATORVASTATIN CALCIUM 40 MG PO TABS
80.0000 mg | ORAL_TABLET | Freq: Every day | ORAL | Status: DC
  Administered 2021-01-09 – 2021-02-19 (×37): 80 mg
  Filled 2021-01-08 (×38): qty 2

## 2021-01-08 MED ORDER — HYDRALAZINE HCL 25 MG PO TABS
25.0000 mg | ORAL_TABLET | Freq: Three times a day (TID) | ORAL | Status: DC
  Administered 2021-01-08 – 2021-01-20 (×30): 25 mg
  Filled 2021-01-08 (×31): qty 1

## 2021-01-08 MED ORDER — ONDANSETRON HCL 4 MG PO TABS: 4.0000 mg | ORAL_TABLET | Freq: Four times a day (QID) | ORAL | Status: DC | PRN

## 2021-01-08 MED ORDER — CLOPIDOGREL BISULFATE 75 MG PO TABS
75.0000 mg | ORAL_TABLET | Freq: Every day | ORAL | Status: DC
  Administered 2021-01-09 – 2021-01-28 (×17): 75 mg
  Filled 2021-01-08 (×17): qty 1

## 2021-01-08 MED ORDER — ACETAMINOPHEN 325 MG PO TABS
650.0000 mg | ORAL_TABLET | Freq: Four times a day (QID) | ORAL | Status: DC | PRN
  Filled 2021-01-08: qty 2

## 2021-01-08 MED ORDER — HYDRALAZINE HCL 25 MG PO TABS: 25.0000 mg | ORAL_TABLET | Freq: Two times a day (BID) | ORAL | Status: DC

## 2021-01-08 NOTE — Progress Notes (Signed)
[facility] scheduled for 0900 on 01/09/21. [facility] to pickup patient at 0830.

## 2021-01-08 NOTE — Progress Notes (Signed)
SLP Cancellation Note  Patient Details Name: Desiree Pollard MRN: [id number] DOB: [birth year]   Cancelled treatment:       Reason Eval/Treat Not Completed: Other (comment);Fatigue/lethargy limiting ability to participate  Kathleen Lime, MS [facility] Office [phone] Pager [phone]   [name] 01/08/2021, 7:26 AM

## 2021-01-08 NOTE — Progress Notes (Signed)
NAME:  Desiree Pollard, MRN:  [id number], DOB:  1946-12-10, LOS: 5 ADMISSION DATE:  01/02/2021, CONSULTATION DATE:  1/24 REFERRING MD:  Teryl Lucy, CHIEF COMPLAINT:  Possible airway obstruction   Brief History:  75 year old female with prior stroke resides at skilled nursing facility presented on 1/20 with altered mental status felt possibly in the setting of acute Covid infection further complicated by dehydration and hypernatremia. Critical care asked to evaluate on 1/24 for once again worsening mental status, but also concerned about risk of upper airway obstruction with possible angioedema  History of Present Illness:  75 year old female who resides at skilled nursing facility after her most recent stroke in October 2021. Apparently at baseline has left-sided weakness and expressive aphasia. Last seen by the patient's sister on 1/17 and noted to be making gurgling noises, and mental status was worse. Speech more slurred. EMS was called on 1/20 for worsening mental status. CTA of head was negative for new acute intracranial abnormality initial sodium 154 BUN 42 creatinine 2.98. Incidentally was found to be Covid positive. Admitted with working diagnosis of acute metabolic encephalopathy in the setting of dehydration, hypernatremia, Covid infection, and renal insufficiency. Hospital course: Admitted to the stepdown unit setting, therapeutic interventions included IV hydration with hypotonic solution, IV remdesivir, and supportive care. 1/22: No issues overnight. Sodium slightly improved. Seen by speech therapy but not able to proceed with  full modified barium swallow due to decreased mental status 1/23: Mental status worse once again, had some mild hypoglycemia, sodium had improved. Had some worsening peripheral edema so D5 half saline discontinued, gave IV Lasix. Had new fever so IV vancomycin and cefepime started empirically. 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  Started on half-saline, and supportive care 1/23: Mental status a little worse, got Lasix for peripheral edema, renal function still worsening.  Started on broad-spectrum antibiotics for fever 1/24 remained encephalopathic, hemodynamically stable, concerned about angioedema critical care consulted given concern about airway protection  Consults:   Procedures:    Significant Diagnostic Tests:  1/20 CT of head this showed no acute intracranial abnormality there was bilateral right greater than left cerebral encephalomalacia with prior right basal ganglier lacunar infarct 1/24 CT soft tissue neck, negative CT chest 1/24: Small pericardial effusion, three-vessel coronary artery arterial sclerosis. Moderate dependent effusions with atelectasis, groundglass bilateral changes. CT head 1/24 negative for acute findings EEG 1/25  Micro Data:  SARS coronavirus two PCR 1/20: Positive MRSA PCR 1/21 - 1/20 urine culture negative Blood culture 1/21:  Antimicrobials:   Cefepime 1/23->> Vancomycin 1/23>>>stopped  Interim History / Subjective:  A little more awake today Objective   Blood pressure (Abnormal) 186/71, pulse 60, temperature (Abnormal) 96.3 F (35.7 C), temperature source Axillary, resp. rate 12, height 5\' 7"  (1.702 m), weight 79.1 kg, SpO2 100 %.        Intake/Output Summary (Last 24 hours) at 01/08/2021 1034 Last data filed at 01/08/2021 0800 Gross per 24 hour  Intake 1466.64 ml  Output 200 ml  Net 1266.64 ml   Filed Weights   01/06/21 0412 01/07/21 0500 01/08/21 0442  Weight: 79.1 kg 79.1 kg 79.1 kg    Examination: General 75 year old female she  seems a  little more awake today HEENT normocephalic nasal trumpet in the right nare, feeding tube in the left mucous membranes moist tongue swelling resolved Pulmonary clear to auscultation currently room air saturations 100% no accessory use Cardiac regular rate and rhythm Abdomen soft nontender Extremities warm Neuro seems a little more awake but not following commands not interactive really  Resolved Hospital Problem list     Assessment & Plan:   Ineffective airway clearance/ineffective cough -was concern for angioedema but clinically this is ruled out or resolved.   Also CT neck re-assuring.  Still mental status concerning.  -abg reviewed and had fairly good gas exchange.  -sed rate only mildly elevated  Plan Continue H2 blockers Keep nasal trumpet in place Continue pulse oximetry  Acute metabolic encephalopathy  With hypoactive delirium superimposed on prior stroke. Seems as though mostly 2/2 COVID but also think complicated by dehydration, hypernatremia, renal failure and mild NAG metabolic acidosis.  -f/u CT head neg for acute findings.  Plan  Correct metabolic derangements  Continue secondary stroke prevention with aspirin and Plavix  Nothing more to offer from a critical care standpoint could consider neurology evaluation for completeness  Dysphagia  -2/2 above. Not able to safely take POs Plan N.p.o. Aspiration precautions  Fever w/out leukocytosis. She does not meet SIRS parameters.  Procal negative & remains nml Plan Day #4 cefepime trend fever await cultures  Acute on chronic renal failure (CKD stage IV) -looks like exacerbated by l.asix  Plan Hold antihypertensives Replace water balance Hold diuretics Renal dose medications Strict intake output Chemistries  Fluid and electrolyte imbalance: hypernatremia (worse after lasix), hyperchloremia, mild NAGMA has anasarca 2/2 (low albumin stores) ->Na was worse, added free water and now slowly improving  nicely Plan Continue free water replacement Continue to trend chemistries Avoid diuresis  DM Plan Sliding scale insulin  Covid-->completed remodels  Plan Monitor  Thrombocytopenia-->improved  -stable Plan Trend CBC  Best practice (evaluated daily)  Diet: NPO-->Place panda  Pain/Anxiety/Delirium protocol (if indicated): NA VAP protocol (if indicated): NA DVT prophylaxis: scd GI prophylaxis: H2B Glucose control: ssi Mobility: BR Disposition: SDU  Goals of Care:  Last date of multidisciplinary goals of care discussion:pending per primary  Family and staff present: pending  Summary of discussion: pending  Follow up goals of care discussion due: per primary  Code Status: full code   Critical care will sign off call if needed Not applicable    Erick Colace ACNP-BC [place] Pager # [phone] OR # [phone] if no answer

## 2021-01-08 NOTE — TOC Progression Note (Signed)
Transition of Care [facility]) - Progression Note    Patient Details  Name: Desiree Pollard MRN: [id number] Date of Birth: Sep 29, 1946  [facility] CM/SW Contact  Desiree Cha, Desiree Pollard Phone Number: 01/08/2021, 7:42 AM  Clinical Narrative:    S : Date of admit  01/02/2021   with LOS  4   for today  01/07/2021   :  Desiree Pollard   is -is not on pressors.  Is not on the ventilator.  Remains encephalopathic with obtundation.  However?  At the time of MD rounds slightly better.  She did try to track and one time noted no to a question of how she is doing.  But she did not follow commands.  EEG shows nonspecific encephalopathy diffuse    BUN in the 50s and creatinine 3.3 mg percent [baseline creatinine around 2.6-2.7 mg percent    Sodium 150 and worse    NG tube insertion had failed and nurses are planning to get a Panda in    O :    Blood pressure  (!) 155/51 , pulse 60, temperature 98.4 F (36.9 C), temperature source Axillary, resp. rate 13, height 5\' 7"  (1.702 m), weight 79.1 kg, SpO2 97 %.       Frail female.  Potential angioedema improved.  Protecting airway.  Has a nasal trumpet.  Moves her head.  She tried to track to voice.  No respiratory distress.  She is bedbound   PLAN: TO RETURN TO [facility] FOLLOWING  FOR PROGRESSION: INCREASED WEAKNESS AND LETHARGY.        Expected Discharge Plan and Services                                                 Social Determinants of Health (SDOH) Interventions    Readmission Risk Interventions No flowsheet data found.

## 2021-01-08 NOTE — Progress Notes (Addendum)
PROGRESS NOTE    Desiree Pollard  TFT:[id number] DOB: 03/19/1946 DOA: 01/02/2021 PCP: [name], FNP     Brief Narrative:  Desiree Pollard is a 75 year old female with past medical history significant for previous stroke with residual left-sided weakness, expressive aphasia, dementia, CKD stage IV, diabetes who presents from her nursing facility due to altered mental status.  She was found to have hypernatremia as well as COVID-19.  She was started on Remdesivir.  There was initial concern for angioedema.  PCCM was consulted but did not feel that she required intubation.  New events last 24 hours / Subjective: Patient is responsive to voice, but does not answer any questions or follows commands.  Assessment & Plan:   Principal Problem:   Acute metabolic encephalopathy Active Problems:   Insulin dependent type 2 diabetes mellitus (HCC)   Hypertension   History of stroke   CKD (chronic kidney disease), stage IV ([place])   COVID-19 virus infection   Hypernatremia   Acute metabolic encephalopathy -Currently not at baseline.  At baseline, patient is conversant per sister.  On examination today, patient is alert but does not answer any questions or follows any commands. -Possibly secondary to hypernatremia, AKI, COVID-19 in setting of underlying dementia -CT head x2 without acute intracranial abnormality -EEG suggestive of moderate to severe diffuse encephalopathy, no epileptiform discharges, seizures seen -MRI brain.  I discussed with MRI techs at [facility].  Patient did have a MRI in November 2021 with pacer present.  Patient to be scheduled for MRI brain at [facility] -Discussed with Dr. [facility] [place] [facility]. She recommended treating the treatable, hypernatremia, AKI, COVID. Also due to cefepime toxicity leading to AMS, to stop antibiotic if no longer needed. Continue to monitor closely. If patient has clinical worsening, I will reach out again for a formal consult   Hypernatremia -Likely  secondary to poor p.o. intake -Continue free water, D5 IVF  -Monitor BMP -Improving  COVID-19 -Tested positive on 1/20 -Remdesivir x 3 doses -Supportive treatments as ordered, encourage IS, encourage prone positioning, encourage mobilization -Remains on room air today  COVID-19 Labs  Recent Labs    01/06/21 0214 01/07/21 0251  DDIMER 1.52* 1.90*  CRP 2.5* 4.1*    Lab Results  Component Value Date   SARSCOV2NAA POSITIVE (A) 01/02/2021   [place] NEGATIVE 11/24/2020   [place] NEGATIVE 10/28/2020   [place] NEGATIVE 10/25/2020    Possible angioedema -Resolved, patient maintaining airway.  CT neck reassuring without significant swelling  HTN -Continue hydralazine   Chronic diastolic heart failure -EF 60-65%  -Hold off on further diuretics, continue Coreg  AKI on CKD stage IV -Baseline creatinine 2.6 -Hold off on further diuretic, nephrotoxins  History of stroke -Diagnosed October 2021, with residual left-sided weakness and expressive aphasia -Continue Plavix, Lipitor  Fever -Blood cultures remain negative and remains afebrile, procal <0.1    DVT prophylaxis:  Place and maintain sequential compression device Start: 01/02/21 2308  Code Status: Full code, confirmed with sister over the phone Family Communication: Updated sister, [name], over the phone.  She states that patient's husband and her are separated, although remains legally married.  Husband is not currently involved in patient's life.  Patient has 1 son who is currently incarcerated.  [name] is patient's main caregiver. Disposition Plan:  Status is: Inpatient  Remains inpatient appropriate because:Hemodynamically unstable, Persistent severe electrolyte disturbances, Altered mental status, IV treatments appropriate due to intensity of illness or inability to take PO and Inpatient level of care appropriate due to  severity of illness   Dispo: The patient is from: SNF               Anticipated d/c is to: SNF              Anticipated d/c date is: > 3 days              Patient currently is not medically stable to d/c.   Difficult to place patient No   Consultants:   PCCM  Antimicrobials:  Anti-infectives (From admission, onward)   Start     Dose/Rate Route Frequency Ordered Stop   01/07/21 0800  ceFEPIme (MAXIPIME) 2 g in sodium chloride 0.9 % 100 mL IVPB        2 g 200 mL/hr over 30 Minutes Intravenous Daily 01/06/21 1159     01/05/21 1500  vancomycin (VANCOCIN) IVPB 1000 mg/200 mL premix  Status:  Discontinued        1,000 mg 200 mL/hr over 60 Minutes Intravenous Every 24 hours 01/05/21 1357 01/05/21 1414   01/05/21 1500  vancomycin (VANCOCIN) IVPB 1000 mg/200 mL premix  Status:  Discontinued        1,000 mg 200 mL/hr over 60 Minutes Intravenous Every 48 hours 01/05/21 1414 01/06/21 1146   01/05/21 1445  ceFEPIme (MAXIPIME) 2 g in sodium chloride 0.9 % 100 mL IVPB  Status:  Discontinued        2 g 200 mL/hr over 30 Minutes Intravenous Daily 01/05/21 1351 01/06/21 1146   01/03/21 1000  remdesivir 100 mg in sodium chloride 0.9 % 100 mL IVPB  Status:  Discontinued       "Followed by" Linked Group Details   100 mg 200 mL/hr over 30 Minutes Intravenous Daily 01/02/21 2257 01/04/21 1330   01/02/21 2300  remdesivir 200 mg in sodium chloride 0.9% 250 mL IVPB       "Followed by" Linked Group Details   200 mg 580 mL/hr over 30 Minutes Intravenous Once 01/02/21 2257 01/03/21 0147        Objective: Vitals:   01/08/21 0800 01/08/21 0852 01/08/21 1000 01/08/21 1121  BP: (!) 152/56 (!) 190/78 (!) 186/71 (!) 174/61  Pulse: (!) 59  60   Resp: 15  12   Temp: (!) 96.3 F (35.7 C)     TempSrc: Axillary     SpO2: 98%  100%   Weight:      Height:        Intake/Output Summary (Last 24 hours) at 01/08/2021 1212 Last data filed at 01/08/2021 0905 Gross per 24 hour  Intake 1516.64 ml  Output 200 ml  Net 1316.64 ml   Filed Weights   01/06/21 0412 01/07/21 0500  01/08/21 0442  Weight: 79.1 kg 79.1 kg 79.1 kg    Examination:  General exam: Appears calm and comfortable  Respiratory system: Clear to auscultation anteriorly, respiratory effort is normal, on room air Cardiovascular system: S1 & S2 heard, RRR. No murmurs.  Gastrointestinal system: Abdomen is nondistended, soft  Central nervous system: Alert to voice but does not follow any commands or answers questions Extremities: Symmetric in appearance   Data Reviewed: I have personally reviewed following labs and imaging studies  CBC: Recent Labs  Lab 01/03/21 0448 01/04/21 0143 01/05/21 0220 01/06/21 0214 01/07/21 0251  WBC 2.8* 2.3* 3.9* 4.0 4.8  NEUTROABS 2.2 1.7 2.9 3.0 4.2  HGB 7.9* 7.8* 7.9* 7.8* 9.7*  HCT 27.3* 27.2* 26.6* 26.6* 32.4*  MCV 97.5 98.6 95.7 95.7 96.1  PLT 71* 70* 78* 77* 696*   Basic Metabolic Panel: Recent Labs  Lab 01/05/21 1632 01/06/21 0214 01/07/21 0251 01/07/21 1903 01/08/21 0539  NA 150* 150* 152* 150* 147*  K 3.4* 3.6 4.0 3.7 3.6  CL 120* 119* 118* 115* 112*  CO2 21* 20* 21* 20* 22  GLUCOSE 106* 74 135* 184* 180*  BUN 46* 48* 57* 67* 68*  CREATININE 3.47* 3.47* 3.33* 4.24* 4.32*  CALCIUM 9.1 9.1 9.3 8.8* 8.7*   GFR: Estimated Creatinine Clearance: 12 mL/min (A) (by C-G formula based on SCr of 4.32 mg/dL (H)). Liver Function Tests: Recent Labs  Lab 01/03/21 0448 01/04/21 0143 01/05/21 0220 01/06/21 0214 01/07/21 0251  AST 58* 48* 40 36 30  ALT 86* 74* 62* 52* 52*  ALKPHOS 140* 136* 132* 123 151*  BILITOT 0.5 0.4 0.5 0.6 0.7  PROT 6.6 6.2* 6.0* 5.9* 7.0  ALBUMIN 2.8* 2.7* 2.6* 2.4* 2.7*   No results for input(s): LIPASE, AMYLASE in the last 168 hours. Recent Labs  Lab 01/05/21 1304 01/06/21 1304  AMMONIA 36* 23   Coagulation Profile: No results for input(s): INR, PROTIME in the last 168 hours. Cardiac Enzymes: No results for input(s): CKTOTAL, CKMB, CKMBINDEX, TROPONINI in the last 168 hours. BNP (last 3 results) No results  for input(s): PROBNP in the last 8760 hours. HbA1C: No results for input(s): HGBA1C in the last 72 hours. CBG: Recent Labs  Lab 01/07/21 1936 01/07/21 2315 01/08/21 0026 01/08/21 0424 01/08/21 0831  GLUCAP 155* 168* 161* 165* 159*   Lipid Profile: No results for input(s): CHOL, HDL, LDLCALC, TRIG, CHOLHDL, LDLDIRECT in the last 72 hours. Thyroid Function Tests: Recent Labs    01/05/21 1304  TSH 3.419   Anemia Panel: No results for input(s): VITAMINB12, FOLATE, FERRITIN, TIBC, IRON, RETICCTPCT in the last 72 hours. Sepsis Labs: Recent Labs  Lab 01/02/21 1947 01/02/21 2258 01/05/21 1632 01/06/21 0214 01/07/21 0251  PROCALCITON  --  <0.10 <0.10 <0.10 <0.10  LATICACIDVEN 0.6  --  0.7  --   --     Recent Results (from the past 240 hour(s))  Urine culture     Status: None   Collection Time: 01/02/21  8:53 PM   Specimen: Urine, Random  Result Value Ref Range Status   Specimen Description   Final    URINE, RANDOM Performed at [facility] [address], [place], [place] [address]    Special Requests   Final    NONE Performed at [facility], [place] [address], [place], [place] [address]    Culture   Final    NO GROWTH Performed at [facility], [place] [address], [place], [place] [address]    Report Status 01/04/2021 FINAL  Final  SARS Coronavirus 2 by RT PCR (hospital order, performed in [facility] hospital lab) Nasopharyngeal Nasopharyngeal Swab     Status: Abnormal   Collection Time: 01/02/21  9:00 PM   Specimen: Nasopharyngeal Swab  Result Value Ref Range Status   SARS Coronavirus 2 POSITIVE (A) NEGATIVE Final    Comment: CRITICAL RESULT CALLED TO, READ BACK BY AND VERIFIED WITH: DR Regenia Skeeter AT 2245 01/02/21 CRU[place]ANK A (NOTE) SARS-CoV-2 target nucleic acids are DETECTED  SARS-CoV-2 RNA is generally detectable in upper respiratory specimens  during the acute phase of infection.  Positive results are indicative  of  the presence of the identified virus, but do not rule out bacterial infection or co-infection with other pathogens not detected by the  test.  Clinical correlation with patient history and  other diagnostic information is necessary to determine patient infection status.  The expected result is negative.  Fact Sheet for Patients:   StrictlyIdeas.no   Fact Sheet for Healthcare Providers:   BankingDealers.co.za    This test is not yet approved or cleared by the [place] FDA and  has been authorized for detection and/or diagnosis of SARS-CoV-2 by FDA under an Emergency Use Authorization (EUA).  This EUA will remain in effect  (meaning this test can be used) for the duration of  the COVID-19 declaration under Section 564(b)(1) of the Act, 21 U.S.C. section 360-bbb-3(b)(1), unless the authorization is terminated or revoked sooner.  Performed at [facility], [place] [address], [place], [place] [address]   MRSA PCR Screening     Status: None   Collection Time: 01/03/21 10:02 PM   Specimen: Nasal Mucosa; Nasopharyngeal  Result Value Ref Range Status   MRSA by PCR NEGATIVE NEGATIVE Final    Comment:        The GeneXpert MRSA Assay (FDA approved for NASAL specimens only), is one component of a comprehensive MRSA colonization surveillance program. It is not intended to diagnose MRSA infection nor to guide or monitor treatment for MRSA infections. Performed at [facility], [place] [address], [place], [place] [address]   Culture, blood (routine x 2)     Status: None (Preliminary result)   Collection Time: 01/03/21 10:15 PM   Specimen: BLOOD  Result Value Ref Range Status   Specimen Description   Final    BLOOD Performed at [facility] [address] [place], [place] [address]    Special Requests   Final    BOTTLES DRAWN AEROBIC ONLY Performed at [place] [address] [place], [place] [address]    Culture   Final    NO GROWTH 4 DAYS Performed at [facility], [place] [address], [place], [place] [address]    Report Status PENDING  Incomplete  Culture, blood (routine x 2)     Status: None (Preliminary result)   Collection Time: 01/03/21 10:24 PM   Specimen: BLOOD RIGHT HAND  Result Value Ref Range Status   Specimen Description   Final    BLOOD RIGHT HAND Performed at [facility] [address], [place], [place] [address]    Special Requests   Final    BOTTLES DRAWN AEROBIC AND ANAEROBIC Blood Culture adequate volume Performed at [facility] [address], [place], [place] [address]    Culture   Final    NO GROWTH 4 DAYS Performed at [facility], [place] [address], [place], [place] [address]    Report Status PENDING  Incomplete      Radiology Studies: DG Abd 1 View  Result Date: 01/07/2021 CLINICAL DATA:  75 year old female status post feeding tube placement. EXAM: ABDOMEN - 1 VIEW COMPARISON:  Radiograph dated 01/07/2021. FINDINGS: Feeding tube with weighted tip in the left upper abdomen likely in the proximal stomach. Small bilateral pleural effusions with bibasilar atelectasis. Pneumonia is not excluded. There is mild cardiomegaly. Pacemaker wires noted. Degenerative changes of spine. IMPRESSION: Feeding tube with weighted tip in the proximal stomach. Electronically Signed   By: Anner Crete M.D.   On: 01/07/2021 17:53   CT HEAD WO CONTRAST  Result Date: 01/06/2021 CLINICAL DATA:  Female status changes. EXAM: CT HEAD WITHOUT CONTRAST TECHNIQUE: Contiguous axial images  were obtained from the base of the skull through the vertex without intravenous contrast. COMPARISON:  01/02/2021 FINDINGS: Brain: There is no evidence for acute hemorrhage, hydrocephalus, mass lesion, or abnormal extra-axial fluid collection. No definite CT evidence for acute infarction. Lacunar infarcts noted  right basal ganglia. Encephalomalacia in both cerebellar hemispheres compatible with old infarcts. Diffuse loss of parenchymal volume is consistent with atrophy. Patchy low attenuation in the deep hemispheric and periventricular white matter is nonspecific, but likely reflects chronic microvascular ischemic demyelination. Vascular: No hyperdense vessel or unexpected calcification. Skull: No evidence for fracture. No worrisome lytic or sclerotic lesion. Sinuses/Orbits: Deformity of the left maxillary sinus may be related to previous trauma Visualized portions of the globes and intraorbital fat are unremarkable. Other: None. IMPRESSION: 1. No acute intracranial abnormality. 2. Atrophy with chronic small vessel white matter ischemic disease. 3. Old bilateral cerebellar infarcts. Electronically Signed   By: Misty Stanley M.D.   On: 01/06/2021 12:26   CT SOFT TISSUE NECK WO CONTRAST  Result Date: 01/06/2021 CLINICAL DATA:  Mental status change, concern for laryngeal edema EXAM: CT NECK WITHOUT CONTRAST TECHNIQUE: Multidetector CT imaging of the neck was performed following the standard protocol without intravenous contrast. COMPARISON:  None. FINDINGS: Motion artifact is present. Pharynx and larynx: No mass or swelling identified within above limitation. Salivary glands: Unremarkable. Thyroid: Subcentimeter left thyroid nodule for which no further ultrasound follow-up is recommended by current guidelines. Lymph nodes: No enlarged lymph nodes identified. Vascular: Calcified plaque at the common carotid bifurcations. Limited intracranial: Dictated separately. Visualized orbits: Bilateral lens replacements. Mastoids and visualized paranasal sinuses: Presumed prior left maxillary sinus surgery with mucosal thickening. Otherwise minor mucosal thickening. Mastoid air cells are clear. Skeleton: Cervical spine degenerative changes. Upper chest: Refer to dedicated chest imaging. Other: None. IMPRESSION: Motion degraded study.  No significant swelling or other acute abnormality identified. Electronically Signed   By: Macy Mis M.D.   On: 01/06/2021 12:34   CT CHEST WO CONTRAST  Result Date: 01/06/2021 CLINICAL DATA:  Inpatient. COVID positive. Abnormal chest radiograph with pleural effusions. EXAM: CT CHEST WITHOUT CONTRAST TECHNIQUE: Multidetector CT imaging of the chest was performed following the standard protocol without IV contrast. COMPARISON:  Chest radiograph from one day prior. FINDINGS: Cardiovascular: Cardiomegaly. Small pericardial effusion/thickening. Two lead right subclavian pacemaker with lead tips in the right atrium and right ventricular apex. Three-vessel coronary atherosclerosis. Atherosclerotic nonaneurysmal thoracic aorta. Normal caliber pulmonary arteries. Aberrant nonaneurysmal right subclavian artery arising from the distal aortic arch with retroesophageal course. Mediastinum/Nodes: Subcentimeter hypodense bilateral thyroid nodules. Not clinically significant; no follow-up imaging recommended (ref: J Am Coll Radiol. 2015 Feb;12(2): 143-50). Unremarkable esophagus. No pathologically enlarged axillary, mediastinal or hilar lymph nodes, noting limited sensitivity for the detection of hilar adenopathy on this noncontrast study. Lungs/Pleura: No pneumothorax. Moderate dependent bilateral pleural effusions. Moderate passive atelectasis in dependent lungs bilaterally. Fairly uniform ground-glass opacity and mild interlobular septal thickening throughout both lungs. No lung masses or significant pulmonary nodules in the aerated portions of the lungs. Upper abdomen: Simple upper left renal cysts, largest 4.0 cm. Musculoskeletal: No aggressive appearing focal osseous lesions. Moderate anasarca. Mild thoracic spondylosis. IMPRESSION: 1. Spectrum of findings most compatible with congestive heart failure. Cardiomegaly. Fairly uniform ground-glass opacity and mild interlobular septal thickening throughout both lungs,  compatible with mild cardiogenic pulmonary edema. Moderate dependent bilateral pleural effusions. Moderate anasarca. 2. Small pericardial effusion/thickening. 3. Three-vessel coronary atherosclerosis. 4. Aberrant right subclavian artery. 5. Aortic Atherosclerosis (ICD10-I70.0). Electronically Signed   By: Corene Cornea  A Poff M.D.   On: 01/06/2021 12:55   DG Abd Portable 1V  Result Date: 01/07/2021 CLINICAL DATA:  Re-attempted feeding tube placement. EXAM: PORTABLE ABDOMEN - 1 VIEW COMPARISON:  Prior today FINDINGS: Feeding tube is again seen entering the stomach, with tip in the gastric fundus or proximal body. No dilated bowel loops seen. Bibasilar pulmonary opacity again noted. IMPRESSION: Feeding tube tip remains in the proximal stomach. Electronically Signed   By: Marlaine Hind M.D.   On: 01/07/2021 17:47   DG Abd Portable 1V  Result Date: 01/07/2021 CLINICAL DATA:  Feeding tube placement EXAM: PORTABLE ABDOMEN - 1 VIEW COMPARISON:  01/07/2021 FINDINGS: Feeding tube loops in the stomach with the tip in the fundus of the stomach. IMPRESSION: Feeding tube tip in the fundus of the stomach. Electronically Signed   By: Rolm Baptise M.D.   On: 01/07/2021 17:41   DG Abd Portable 1V  Result Date: 01/07/2021 CLINICAL DATA:  Nasogastric tube placement EXAM: PORTABLE ABDOMEN - 1 VIEW COMPARISON:  04/25/2020 FINDINGS: No nasogastric tube is identified within the visualized abdomen. A a radiopacity is seen at the superior margin of the examination which may represent the tip of the reported nasogastric tube within the left mainstem bronchus. Mild to moderate cardiomegaly. Small left pleural effusion. The visualized abdominal gas pattern is unremarkable. Pelvis excluded from view. IMPRESSION: Nasogastric tube not identified within the abdomen. Tip may potentially be within the left mainstem bronchus. Clinical correlation is required. These results will be called to the ordering clinician or representative by the  Radiologist Assistant, and communication documented in the PACS or [employer]. Electronically Signed   By: Fidela Salisbury MD   On: 01/07/2021 04:58   EEG adult  Result Date: 01/07/2021 Lora Havens, MD     01/07/2021  3:24 PM Patient Name: Sumaiyah Markert MRN: [id number] Epilepsy Attending: Lora Havens Referring Physician/Provider: Dr. Cordelia Poche Date: 01/07/2021 Duration: 23.11 mins Patient history: 75 year old female with altered mental status. EEG to evaluate for seizures. Level of alertness:  lethargic AEDs during EEG study: None Technical aspects: This EEG study was done with scalp electrodes positioned according to the 10-20 International system of electrode placement. Electrical activity was acquired at a sampling rate of 500Hz  and reviewed with a high frequency filter of 70Hz  and a low frequency filter of 1Hz . EEG data were recorded continuously and digitally stored. Description: EEG showed continuous generalized 3 to 6 Hz theta-delta slowing.  Hyperventilation and photic stimulation were not performed.   ABNORMALITY -Continuous slow, generalized IMPRESSION: This study is suggestive of moderate to severe diffuse encephalopathy, nonspecific etiology. No seizures or epileptiform discharges were seen throughout the recording. Lora Havens   ECHOCARDIOGRAM COMPLETE  Result Date: 01/07/2021    ECHOCARDIOGRAM REPORT   Patient Name:   Dorcas Lall Date of Exam: 01/07/2021 Medical Rec #:  [id number]     Height:       67.0 in Accession #:    [id number]    Weight:       174.4 lb Date of Birth:  [birth year]     BSA:          1.908 m Patient Age:    80 years      BP:           169/45 mmHg Patient Gender: F             HR:           67 bpm. Exam Location:  Inpatient  Procedure: 2D Echo Indications:    Congestive Heart Failure I50.9  History:        Patient has prior history of Echocardiogram examinations, most                 recent 11/01/2020. Stroke; Risk Factors:Diabetes, Hypertension                  and Former Smoker. Acute Renal Failure, Covid 19.  Sonographer:    Leavy Cella Referring Phys: Andrew  1. Left ventricular ejection fraction, by estimation, is 60 to 65%. The left ventricle has normal function. The left ventricle has no regional wall motion abnormalities. There is moderate left ventricular hypertrophy. Left ventricular diastolic parameters are indeterminate. Elevated left atrial pressure.  2. Right ventricular systolic function is normal. The right ventricular size is normal.  3. Left atrial size was mildly dilated.  4. A small pericardial effusion is present.  5. The mitral valve is normal in structure. Mild mitral valve regurgitation. No evidence of mitral stenosis.  6. The aortic valve is tricuspid. Aortic valve regurgitation is not visualized. Mild aortic valve sclerosis is present, with no evidence of aortic valve stenosis.  7. The inferior vena cava is normal in size with greater than 50% respiratory variability, suggesting right atrial pressure of 3 mmHg. FINDINGS  Left Ventricle: Left ventricular ejection fraction, by estimation, is 60 to 65%. The left ventricle has normal function. The left ventricle has no regional wall motion abnormalities. The left ventricular internal cavity size was normal in size. There is  moderate left ventricular hypertrophy. Left ventricular diastolic parameters are indeterminate. Elevated left atrial pressure. Right Ventricle: The right ventricular size is normal. Right ventricular systolic function is normal. Left Atrium: Left atrial size was mildly dilated. Right Atrium: Right atrial size was normal in size. Pericardium: A small pericardial effusion is present. Mitral Valve: The mitral valve is normal in structure. Mild mitral annular calcification. Mild mitral valve regurgitation. No evidence of mitral valve stenosis. Tricuspid Valve: The tricuspid valve is normal in structure. Tricuspid valve regurgitation is mild . No  evidence of tricuspid stenosis. Aortic Valve: The aortic valve is tricuspid. Aortic valve regurgitation is not visualized. Mild aortic valve sclerosis is present, with no evidence of aortic valve stenosis. Pulmonic Valve: The pulmonic valve was normal in structure. Pulmonic valve regurgitation is not visualized. No evidence of pulmonic stenosis. Aorta: The aortic root is normal in size and structure. Venous: The inferior vena cava is normal in size with greater than 50% respiratory variability, suggesting right atrial pressure of 3 mmHg. IAS/Shunts: No atrial level shunt detected by color flow Doppler. Additional Comments: A pacer wire is visualized.  LEFT VENTRICLE PLAX 2D LVIDd:         4.58 cm  Diastology LVIDs:         2.86 cm  LV e' medial:    4.13 cm/s LV PW:         1.51 cm  LV E/e' medial:  24.2 LV IVS:        1.37 cm  LV e' lateral:   4.35 cm/s LVOT diam:     1.80 cm  LV E/e' lateral: 23.0 LVOT Area:     2.54 cm  RIGHT VENTRICLE RV S prime:     13.80 cm/s TAPSE (M-mode): 2.2 cm LEFT ATRIUM         Index LA diam:    4.30 cm 2.25 cm/m   AORTA Ao Root diam:  2.30 cm MITRAL VALVE                TRICUSPID VALVE MV Area (PHT): 3.21 cm     TR Peak grad:   39.9 mmHg MV Decel Time: 236 msec     TR Vmax:        316.00 cm/s MR Peak grad:   131.3 mmHg MR Vmax:        573.00 cm/s SHUNTS MR PISA:        1.57 cm    Systemic Diam: 1.80 cm MR PISA Radius: 0.50 cm MV E velocity: 100.00 cm/s MV A velocity: 114.00 cm/s MV E/A ratio:  0.88 Kirk Ruths MD Electronically signed by Kirk Ruths MD Signature Date/Time: 01/07/2021/2:41:54 PM    Final       Scheduled Meds: . atorvastatin  80 mg Per Tube Daily  . carvedilol  25 mg Per Tube BID WC  . chlorhexidine  15 mL Mouth Rinse BID  . Chlorhexidine Gluconate Cloth  6 each Topical Q0600  . [START ON 01/09/2021] clopidogrel  75 mg Per Tube Daily  . diphenhydrAMINE  50 mg Intravenous Q8H  . dorzolamide-timolol  1 drop Both Eyes BID  . ferrous sulfate  300 mg Per  Tube QODAY  . free water  200 mL Per Tube Q6H  . hydrALAZINE  25 mg Per Tube BID  . insulin aspart  0-9 Units Subcutaneous Q4H  . mouth rinse  15 mL Mouth Rinse q12n4p  . pantoprazole sodium  40 mg Per Tube Daily   Continuous Infusions: . ceFEPime (MAXIPIME) IV Stopped (01/08/21 0935)  . dextrose 70 mL/hr at 01/08/21 0800  . famotidine (PEPCID) IV Stopped (01/07/21 1634)     LOS: 5 days      Time spent: 60 minutes   Dessa Phi, DO [facility] 01/08/2021, 12:12 PM   Available via Epic secure chat 7am-7pm After these hours, please refer to coverage provider listed on [email or web address]

## 2021-01-09 ENCOUNTER — Ambulatory Visit (HOSPITAL_COMMUNITY)
Admit: 2021-01-09 | Discharge: 2021-01-09 | Disposition: A | Discharge status: Home / Self Care | Payer: Medicare HMO | Attending: Internal Medicine | Admitting: Internal Medicine

## 2021-01-09 DIAGNOSIS — E870 Hyperosmolality and hypernatremia: Secondary | ICD-10-CM | POA: Diagnosis not present

## 2021-01-09 LAB — CBC
HCT: 27.8 % — ABNORMAL LOW (ref 36.0–46.0)
Hemoglobin: 8.4 g/dL — ABNORMAL LOW (ref 12.0–15.0)
MCH: 28.7 pg (ref 26.0–34.0)
MCHC: 30.2 g/dL (ref 30.0–36.0)
MCV: 94.9 fL (ref 80.0–100.0)
Platelets: 105 10*3/uL — ABNORMAL LOW (ref 150–400)
RBC: 2.93 MIL/uL — ABNORMAL LOW (ref 3.87–5.11)
RDW: 15.4 % (ref 11.5–15.5)
WBC: 6.5 10*3/uL (ref 4.0–10.5)
nRBC: 0.3 % — ABNORMAL HIGH (ref 0.0–0.2)

## 2021-01-09 LAB — BASIC METABOLIC PANEL WITH GFR
Anion gap: 12 (ref 5–15)
BUN: 63 mg/dL — ABNORMAL HIGH (ref 8–23)
CO2: 19 mmol/L — ABNORMAL LOW (ref 22–32)
Calcium: 8.2 mg/dL — ABNORMAL LOW (ref 8.9–10.3)
Chloride: 111 mmol/L (ref 98–111)
Creatinine, Ser: 4.17 mg/dL — ABNORMAL HIGH (ref 0.44–1.00)
GFR, Estimated: 11 mL/min — ABNORMAL LOW (ref >60)
Glucose, Bld: 138 mg/dL — ABNORMAL HIGH (ref 70–99)
Potassium: 3.1 mmol/L — ABNORMAL LOW (ref 3.5–5.1)
Sodium: 142 mmol/L (ref 135–145)

## 2021-01-09 LAB — CULTURE, BLOOD (ROUTINE X 2)
Culture: NO GROWTH
Culture: NO GROWTH
Special Requests: ADEQUATE

## 2021-01-09 LAB — GLUCOSE, CAPILLARY
Glucose-Capillary: 142 mg/dL — ABNORMAL HIGH (ref 70–99)
Glucose-Capillary: 124 mg/dL — ABNORMAL HIGH (ref 70–99)
Glucose-Capillary: 101 mg/dL — ABNORMAL HIGH (ref 70–99)
Glucose-Capillary: 107 mg/dL — ABNORMAL HIGH (ref 70–99)
Glucose-Capillary: 104 mg/dL — ABNORMAL HIGH (ref 70–99)
Glucose-Capillary: 110 mg/dL — ABNORMAL HIGH (ref 70–99)
Glucose-Capillary: 116 mg/dL — ABNORMAL HIGH (ref 70–99)
Glucose-Capillary: 135 mg/dL — ABNORMAL HIGH (ref 70–99)

## 2021-01-09 LAB — PHOSPHORUS: Phosphorus: 4.5 mg/dL (ref 2.5–4.6)

## 2021-01-09 LAB — MAGNESIUM: Magnesium: 2.1 mg/dL (ref 1.7–2.4)

## 2021-01-09 IMAGING — MR MR HEAD W/O CM
12 of 13 series · 44 of 48 positions shown · non-contrast
Comparison: Head CT [DATE].

CLINICAL DATA: Mental status change of unknown cause.

EXAM:
MRI HEAD WITHOUT CONTRAST
TECHNIQUE: Multiplanar, multiecho pulse sequences of the brain and surrounding
structures were obtained without intravenous contrast.

[Series 5: DWI · axial · 3.0mm · 0.88mm/px · z∈[-104,+55]mm · 9 of 108 slices shown (1 of 4)]
[im 1/108]
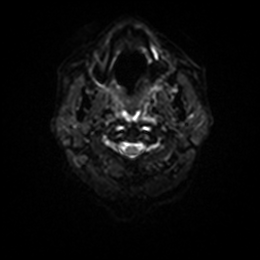
[im 14/108]
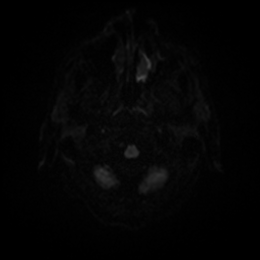
[im 27/108]
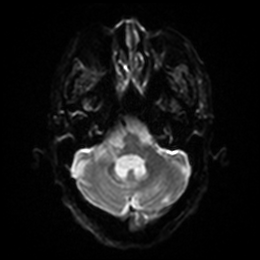
[im 41/108]
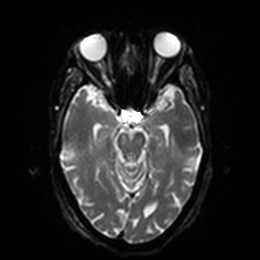
[im 54/108]
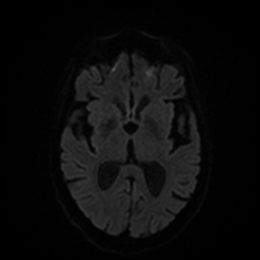
[im 67/108]
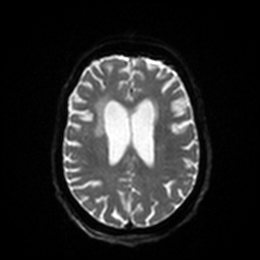
[im 81/108]
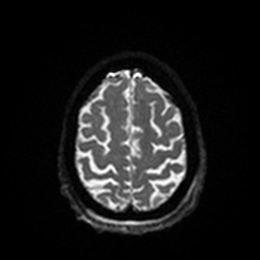
[im 94/108]
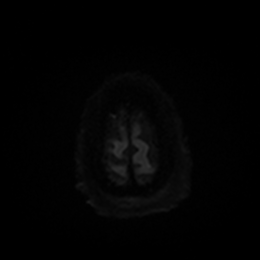
[im 108/108]
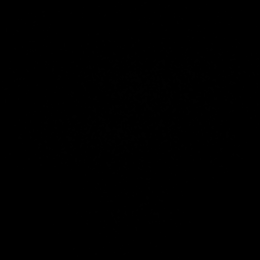

[Series 6: DWI · axial · 3.0mm · 0.88mm/px · z∈[-104,+52]mm · 4 of 52 slices shown (2 of 4)]
[im 1/52]
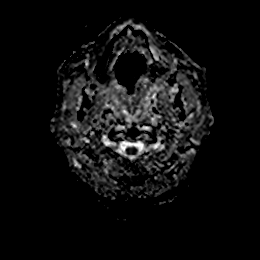
[im 18/52]
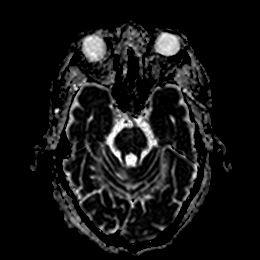
[im 35/52]
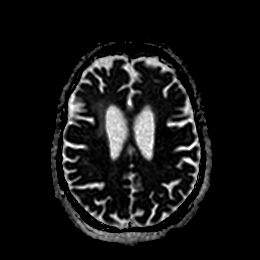
[im 52/52]
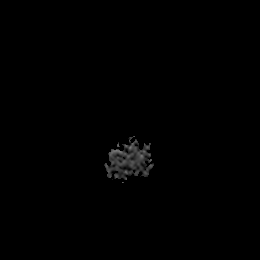

[Series 7: DWI · coronal · 4.0mm · 0.88mm/px · 5 of 64 slices shown (3 of 4)]
[im 1/64]
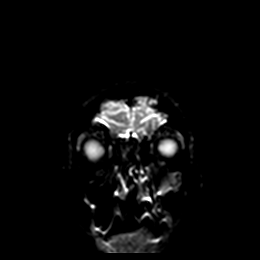
[im 16/64]
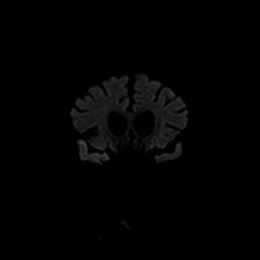
[im 32/64]
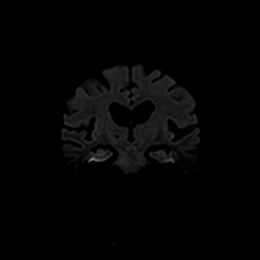
[im 48/64]
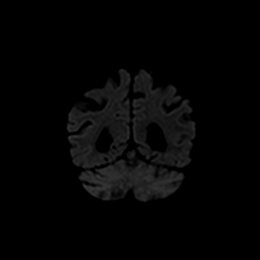
[im 64/64]
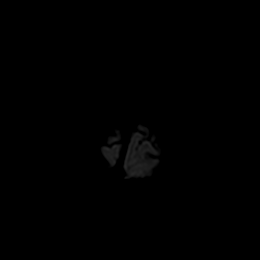

[Series 8: DWI · coronal · 4.0mm · 0.88mm/px · 2 of 32 slices shown (4 of 4)]
[im 1/32]
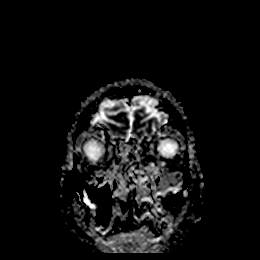
[im 32/32]
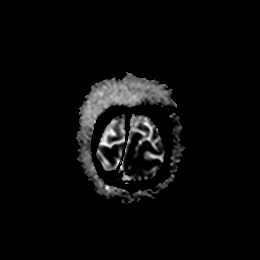

[Series 9: T1 · sagittal · 5.0mm · 0.75mm/px · 2 of 23 slices shown]
[im 1/23]
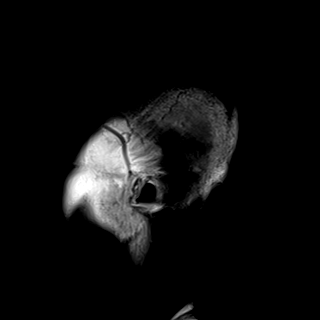
[im 23/23]
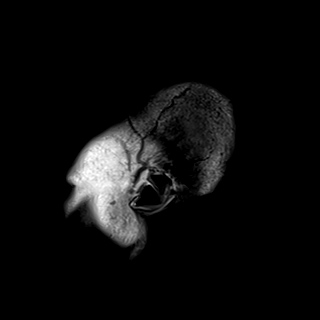

[Series 10: T2 · axial · 5.0mm · 0.72mm/px · z∈[-91,+64]mm · 2 of 27 slices shown (1 of 2)]
[im 1/27]
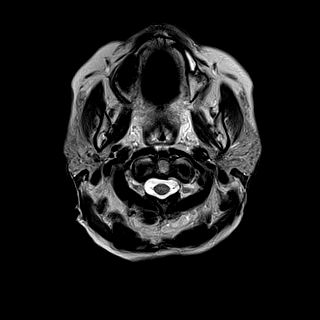
[im 27/27]
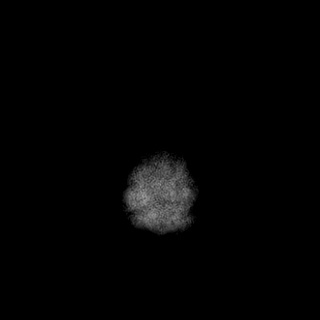

[Series 11: FLAIR · axial · 5.0mm · 0.45mm/px · z∈[-91,+64]mm · 2 of 27 slices shown]
[im 1/27]
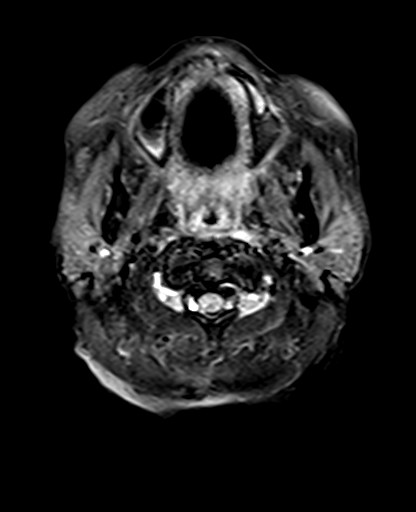
[im 27/27]
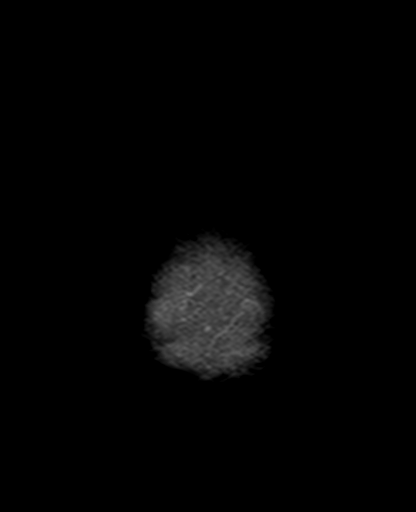

[Series 12: mag_images · axial · 3.0mm · 0.90mm/px · z∈[-97,+80]mm · 4 of 60 slices shown]
[im 1/60]
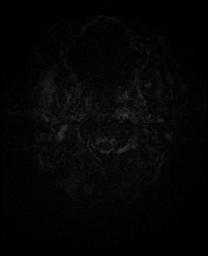
[im 20/60]
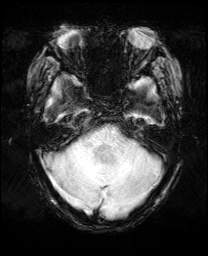
[im 40/60]
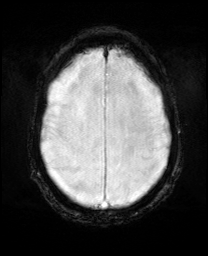
[im 60/60]
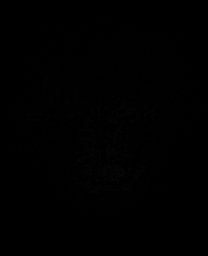

[Series 13: pha_images · axial · 3.0mm · 0.90mm/px · z∈[-94,+68]mm · 4 of 55 slices shown]
[im 1/55]
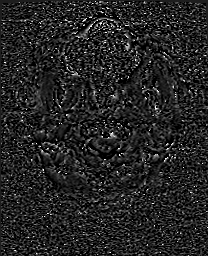
[im 19/55]
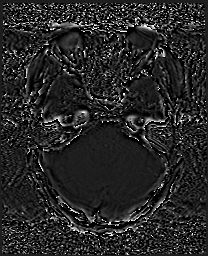
[im 37/55]
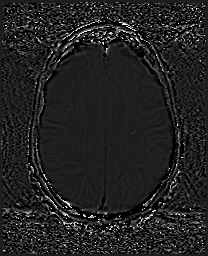
[im 55/55]
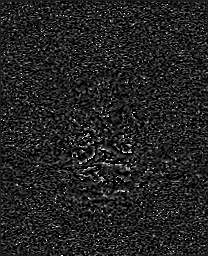

[Series 14: swi_images · axial · 3.0mm · 0.90mm/px · z∈[-97,+80]mm · 4 of 60 slices shown]
[im 1/60]
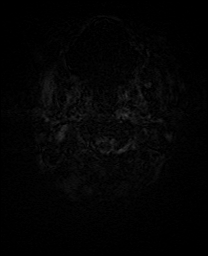
[im 20/60]
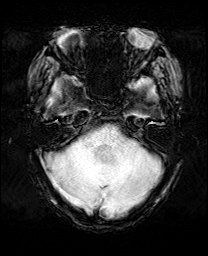
[im 40/60]
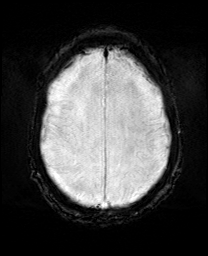
[im 60/60]
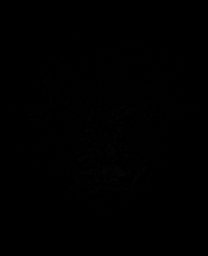

[Series 15: mip_images(sw) · axial · 24.0mm · 0.90mm/px · z∈[-86,+69]mm · 4 of 53 slices shown]
[im 1/53]
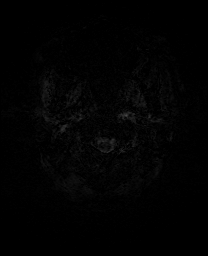
[im 18/53]
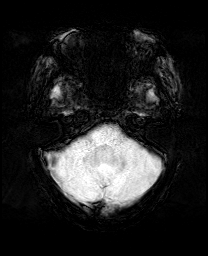
[im 35/53]
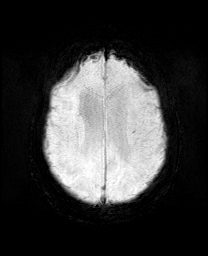
[im 53/53]
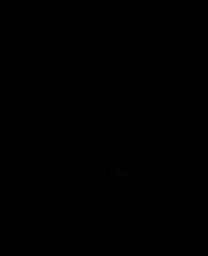

[Series 17: T2 · coronal · 5.0mm · 0.34mm/px · 2 of 29 slices shown (2 of 2)]
[im 1/29]
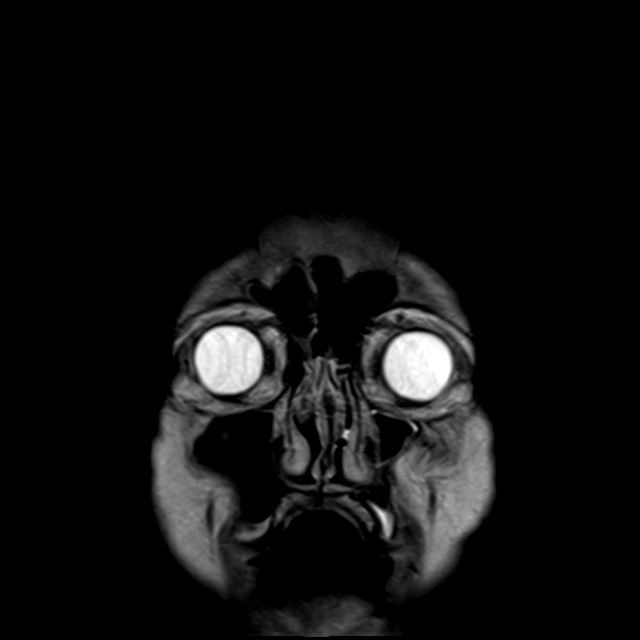
[im 29/29]
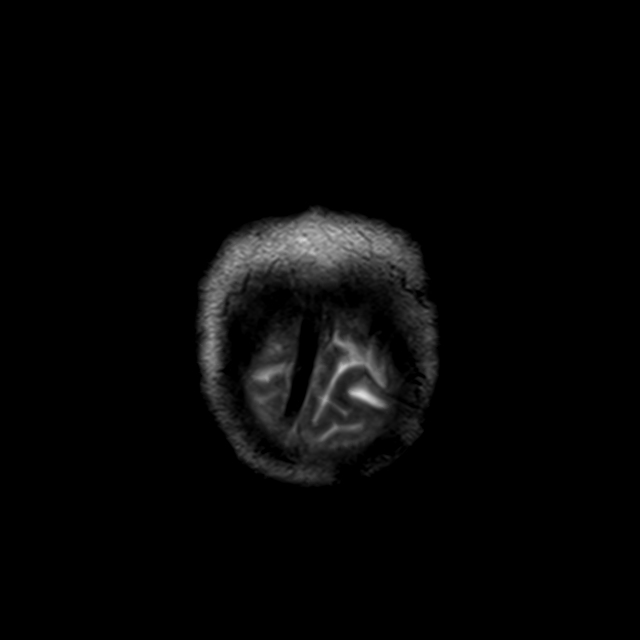

[44 of 48 positions shown; findings below may reference images not displayed]

FINDINGS: Brain: No acute infarction, hemorrhage, hydrocephalus, extra-axial
collection or mass lesion.

Remote infarcts in the bilateral cerebellar hemisphere with gliosis
and volume loss, more pronounced on the right hemisphere.

Remote small infarcts are also noted in the right thalamus and basal
ganglia. Scattered foci of T2 hyperintensity are seen in the
periventricular white matter may be related to chronic
microangiopathic changes. T2 hyperintensity within the pons and
right cerebral peduncle is likely related to wallerian degeneration.

Vascular: Normal flow voids.

Skull and upper cervical spine: Normal marrow signal.

Sinuses/Orbits: Decreased volume of the left maxillary sinus.
Bilateral lens surgery.
IMPRESSION: 1. No acute intracranial abnormality.
2. Remote infarcts in the bilateral cerebellar hemisphere, right
thalamus and basal ganglia, similar to prior.
3. Mild chronic microangiopathic changes.

## 2021-01-09 MED ORDER — POTASSIUM CHLORIDE 20 MEQ PO PACK
40.0000 meq | PACK | Freq: Two times a day (BID) | ORAL | Status: AC
  Administered 2021-01-09 (×2): 40 meq
  Filled 2021-01-09 (×2): qty 2

## 2021-01-09 MED ORDER — PROSOURCE TF PO LIQD
45.0000 mL | Freq: Three times a day (TID) | ORAL | Status: DC
  Administered 2021-01-09 – 2021-01-13 (×14): 45 mL
  Filled 2021-01-09 (×16): qty 45

## 2021-01-09 MED ORDER — OSMOLITE 1.5 CAL PO LIQD
1000.0000 mL | ORAL | Status: DC
  Administered 2021-01-09 – 2021-01-12 (×4): 1000 mL
  Filled 2021-01-09 (×9): qty 1000

## 2021-01-09 NOTE — Progress Notes (Signed)
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 Will monitor patient, set pacemaker back to regular settings, and send transmission after MRI scan.

## 2021-01-09 NOTE — Progress Notes (Signed)
Patient returned from MRI [facility]), reported uneventful transport from [place]. Patient's rectal temp. 92.6 F, Bair hugger applied, MD made aware. Other vital signs stable, remains on room air. Will continue to monitor.

## 2021-01-09 NOTE — Progress Notes (Signed)
Patient picked up by [facility]; en route to [facility].

## 2021-01-09 NOTE — Progress Notes (Signed)
Informed of MRI for today.   Device system confirmed to be MRI conditional, with implant date > 6 weeks ago and no evidence of abandoned or epicardial leads in review of most recent CXR Interrogation from today reviewed, pt is currently AP-VP at 60 bpm Change device settings for MRI to DOO at 80 bpm  Tachy-therapies to off if applicable.  Program device back to pre-MRI settings after completion of exam.  Annamaria Helling  01/09/2021 9:26 AM

## 2021-01-09 NOTE — Progress Notes (Addendum)
PROGRESS NOTE    Desiree Pollard  CLE:[id number] DOB: 1946-04-13 DOA: 01/02/2021 PCP: [name], FNP     Brief Narrative:  Desiree Pollard is a 75 year old female with past medical history significant for previous stroke with residual left-sided weakness, expressive aphasia, dementia, CKD stage IV, diabetes who presents from her nursing facility due to altered mental status.  She was found to have hypernatremia as well as COVID-19.  She was started on Remdesivir.  There was initial concern for angioedema.  PCCM was consulted but did not feel that she required intubation.  New events last 24 hours / Subjective: Underwent MRI at [facility] this morning. Rectal temp 92.6 and placed on bair hugger. She is bit more responsive to voice today compared to yesterday. She opens her eyes, mouths "hi", and able to follow simple commands such as moving her feet and squeezing my hand   Assessment & Plan:   Principal Problem:   Acute metabolic encephalopathy Active Problems:   Insulin dependent type 2 diabetes mellitus ([place])   Hypertension   History of stroke   CKD (chronic kidney disease), stage IV ([place])   COVID-19 virus infection   Hypernatremia   Acute metabolic encephalopathy -Currently not at baseline.  At baseline, patient is conversant per sister.  On examination today, patient is alert and follows simple commands  -Possibly secondary to hypernatremia, AKI, COVID-19 in setting of underlying dementia -CT head x2 without acute intracranial abnormality -EEG suggestive of moderate to severe diffuse encephalopathy, no epileptiform discharges, seizures seen -MRI brain without acute intracranial abnormality  -Discussed with Dr. Curly [place] [facility] on 1/26. She recommended treating the treatable, hypernatremia, AKI, COVID. Also due to cefepime toxicity leading to AMS, to stop antibiotic if no longer needed. Continue to monitor closely. If patient has clinical worsening, I will reach out again for a formal  consult  -Slight improvement today but not at baseline, will continue to monitor  -Dietitian consulted to initiate tube feeding, patient still unsafe to take PO   Hypernatremia -Likely secondary to poor p.o. intake -Continue free water -Resolved   COVID-19 -Tested positive on 1/20 -Remdesivir x 3 doses -Supportive treatments as ordered, encourage IS, encourage prone positioning, encourage mobilization -Remains on room air today  COVID-19 Labs  Recent Labs    01/07/21 0251  DDIMER 1.90*  CRP 4.1*    Lab Results  Component Value Date   SARSCOV2NAA POSITIVE (A) 01/02/2021   [place] NEGATIVE 11/24/2020   [place] NEGATIVE 10/28/2020   [place] NEGATIVE 10/25/2020    Possible angioedema -Resolved, patient maintaining airway.  CT neck reassuring without significant swelling  HTN -Continue hydralazine   Chronic diastolic heart failure -EF 60-65%  -Hold off on further diuretics, continue Coreg  AKI on CKD stage IV -Baseline creatinine 2.6 -Hold off on further diuretic, nephrotoxins -Slight improvement in Cr today, cont to monitor   History of stroke -Diagnosed October 2021, with residual left-sided weakness and expressive aphasia -Continue Plavix, Lipitor  Fever -Blood cultures remain negative and remains afebrile, procal <0.1  -Now off cefepime  Hypokalemia -Replace, trend    DVT prophylaxis:  Place and maintain sequential compression device Start: 01/02/21 2308  Code Status: Full code, confirmed with sister over the phone Family Communication: Updated sister, [name], over the phone 1/27.  She states that patient's husband and her are separated, although remains legally married.  Husband is not currently involved in patient's life.  Patient has 1 son who is currently incarcerated.  [name] is patient's main caregiver. Disposition Plan:  Status is: Inpatient  Remains inpatient appropriate because:Altered mental status, IV treatments  appropriate due to intensity of illness or inability to take PO and Inpatient level of care appropriate due to severity of illness   Dispo: The patient is from: SNF              Anticipated d/c is to: SNF              Anticipated d/c date is: > 3 days              Patient currently is not medically stable to d/c.   Difficult to place patient No   Consultants:   PCCM  Antimicrobials:  Anti-infectives (From admission, onward)   Start     Dose/Rate Route Frequency Ordered Stop   01/07/21 0800  ceFEPIme (MAXIPIME) 2 g in sodium chloride 0.9 % 100 mL IVPB  Status:  Discontinued        2 g 200 mL/hr over 30 Minutes Intravenous Daily 01/06/21 1159 01/08/21 1257   01/05/21 1500  vancomycin (VANCOCIN) IVPB 1000 mg/200 mL premix  Status:  Discontinued        1,000 mg 200 mL/hr over 60 Minutes Intravenous Every 24 hours 01/05/21 1357 01/05/21 1414   01/05/21 1500  vancomycin (VANCOCIN) IVPB 1000 mg/200 mL premix  Status:  Discontinued        1,000 mg 200 mL/hr over 60 Minutes Intravenous Every 48 hours 01/05/21 1414 01/06/21 1146   01/05/21 1445  ceFEPIme (MAXIPIME) 2 g in sodium chloride 0.9 % 100 mL IVPB  Status:  Discontinued        2 g 200 mL/hr over 30 Minutes Intravenous Daily 01/05/21 1351 01/06/21 1146   01/03/21 1000  remdesivir 100 mg in sodium chloride 0.9 % 100 mL IVPB  Status:  Discontinued       "Followed by" Linked Group Details   100 mg 200 mL/hr over 30 Minutes Intravenous Daily 01/02/21 2257 01/04/21 1330   01/02/21 2300  remdesivir 200 mg in sodium chloride 0.9% 250 mL IVPB       "Followed by" Linked Group Details   200 mg 580 mL/hr over 30 Minutes Intravenous Once 01/02/21 2257 01/03/21 0147       Objective: Vitals:   01/09/21 0500 01/09/21 0800 01/09/21 0826 01/09/21 1229  BP: (!) 184/64 (!) 188/65 (!) 169/92 (!) 159/64  Pulse: (!) 58 (!) 59 (!) 57 60  Resp: 15 18 (!) 21 17  Temp: (!) 97.5 F (36.4 C)  (!) 95.1 F (35.1 C) (!) 92.6 F (33.7 C)  TempSrc:  Oral  Rectal Rectal  SpO2: 100% 100% 98% 100%  Weight: 71.3 kg     Height:        Intake/Output Summary (Last 24 hours) at 01/09/2021 1303 Last data filed at 01/09/2021 1000 Gross per 24 hour  Intake 2717.96 ml  Output 2300 ml  Net 417.96 ml   Filed Weights   01/07/21 0500 01/08/21 0442 01/09/21 0500  Weight: 79.1 kg 79.1 kg 71.3 kg    Examination: General exam: Appears calm and comfortable  Respiratory system: Clear to auscultation. Respiratory effort normal.  On room air Cardiovascular system: S1 & S2 heard, RRR. No pedal edema. Gastrointestinal system: Abdomen is nondistended, soft and nontender.  NG in place Central nervous system: Alert to voice, able to wiggle her toes and squeeze my hand on the right side, does not answer any questions Extremities: Symmetric in appearance bilaterally  Skin: "Raccoon eyes" with darkening  around her periorbital region   Data Reviewed: I have personally reviewed following labs and imaging studies  CBC: Recent Labs  Lab 01/03/21 0448 01/04/21 0143 01/05/21 0220 01/06/21 0214 01/07/21 0251 01/09/21 0442  WBC 2.8* 2.3* 3.9* 4.0 4.8 6.5  NEUTROABS 2.2 1.7 2.9 3.0 4.2  --   HGB 7.9* 7.8* 7.9* 7.8* 9.7* 8.4*  HCT 27.3* 27.2* 26.6* 26.6* 32.4* 27.8*  MCV 97.5 98.6 95.7 95.7 96.1 94.9  PLT 71* 70* 78* 77* 115* 193*   Basic Metabolic Panel: Recent Labs  Lab 01/07/21 0251 01/07/21 1903 01/08/21 0539 01/08/21 1802 01/09/21 0442  NA 152* 150* 147* 143 142  K 4.0 3.7 3.6 4.4 3.1*  CL 118* 115* 112* 111 111  CO2 21* 20* 22 23 19*  GLUCOSE 135* 184* 180* 147* 138*  BUN 57* 67* 68* 65* 63*  CREATININE 3.33* 4.24* 4.32* 4.38* 4.17*  CALCIUM 9.3 8.8* 8.7* 8.2* 8.2*   GFR: Estimated Creatinine Clearance: 11.2 mL/min (A) (by C-G formula based on SCr of 4.17 mg/dL (H)). Liver Function Tests: Recent Labs  Lab 01/03/21 0448 01/04/21 0143 01/05/21 0220 01/06/21 0214 01/07/21 0251  AST 58* 48* 40 36 30  ALT 86* 74* 62* 52* 52*   ALKPHOS 140* 136* 132* 123 151*  BILITOT 0.5 0.4 0.5 0.6 0.7  PROT 6.6 6.2* 6.0* 5.9* 7.0  ALBUMIN 2.8* 2.7* 2.6* 2.4* 2.7*   No results for input(s): LIPASE, AMYLASE in the last 168 hours. Recent Labs  Lab 01/05/21 1304 01/06/21 1304  AMMONIA 36* 23   Coagulation Profile: No results for input(s): INR, PROTIME in the last 168 hours. Cardiac Enzymes: No results for input(s): CKTOTAL, CKMB, CKMBINDEX, TROPONINI in the last 168 hours. BNP (last 3 results) No results for input(s): PROBNP in the last 8760 hours. HbA1C: No results for input(s): HGBA1C in the last 72 hours. CBG: Recent Labs  Lab 01/08/21 2057 01/09/21 0053 01/09/21 0458 01/09/21 0814 01/09/21 1230  GLUCAP 138* 142* 124* 101* 107*   Lipid Profile: No results for input(s): CHOL, HDL, LDLCALC, TRIG, CHOLHDL, LDLDIRECT in the last 72 hours. Thyroid Function Tests: No results for input(s): TSH, T4TOTAL, FREET4, T3FREE, THYROIDAB in the last 72 hours. Anemia Panel: No results for input(s): VITAMINB12, FOLATE, FERRITIN, TIBC, IRON, RETICCTPCT in the last 72 hours. Sepsis Labs: Recent Labs  Lab 01/02/21 1947 01/02/21 2258 01/05/21 1632 01/06/21 0214 01/07/21 0251  PROCALCITON  --  <0.10 <0.10 <0.10 <0.10  LATICACIDVEN 0.6  --  0.7  --   --     Recent Results (from the past 240 hour(s))  Urine culture     Status: None   Collection Time: 01/02/21  8:53 PM   Specimen: Urine, Random  Result Value Ref Range Status   Specimen Description   Final    URINE, RANDOM Performed at [place] [address], [place], [place] [address]    Special Requests   Final    NONE Performed at [facility], [place] [address], [place], [place] [address]    Culture   Final    NO GROWTH Performed at [facility], [place] [address], [place], [place] [address]    Report Status 01/04/2021 FINAL  Final  SARS Coronavirus 2 by RT PCR (hospital order, performed in [facility] hospital lab)  Nasopharyngeal Nasopharyngeal Swab     Status: Abnormal   Collection Time: 01/02/21  9:00 PM   Specimen: Nasopharyngeal Swab  Result Value Ref Range Status  SARS Coronavirus 2 POSITIVE (A) NEGATIVE Final    Comment: CRITICAL RESULT CALLED TO, READ BACK BY AND VERIFIED WITH: DR Regenia Skeeter AT 2245 01/02/21 [place] A (NOTE) SARS-CoV-2 target nucleic acids are DETECTED  SARS-CoV-2 RNA is generally detectable in upper respiratory specimens  during the acute phase of infection.  Positive results are indicative  of the presence of the identified virus, but do not rule out bacterial infection or co-infection with other pathogens not detected by the test.  Clinical correlation with patient history and  other diagnostic information is necessary to determine patient infection status.  The expected result is negative.  Fact Sheet for Patients:   StrictlyIdeas.no   Fact Sheet for Healthcare Providers:   BankingDealers.co.za    This test is not yet approved or cleared by the [place] FDA and  has been authorized for detection and/or diagnosis of SARS-CoV-2 by FDA under an Emergency Use Authorization (EUA).  This EUA will remain in effect  (meaning this test can be used) for the duration of  the COVID-19 declaration under Section 564(b)(1) of the Act, 21 U.S.C. section 360-bbb-3(b)(1), unless the authorization is terminated or revoked sooner.  Performed at [facility], [place] [address], [place], [place] [address]   MRSA PCR Screening     Status: None   Collection Time: 01/03/21 10:02 PM   Specimen: Nasal Mucosa; Nasopharyngeal  Result Value Ref Range Status   MRSA by PCR NEGATIVE NEGATIVE Final    Comment:        The GeneXpert MRSA Assay (FDA approved for NASAL specimens only), is one component of a comprehensive MRSA colonization surveillance program. It is not intended to diagnose MRSA infection nor to guide  or monitor treatment for MRSA infections. Performed at [facility], [place] [address], [place], [place] [address]   Culture, blood (routine x 2)     Status: None (Preliminary result)   Collection Time: 01/03/21 10:15 PM   Specimen: BLOOD  Result Value Ref Range Status   Specimen Description   Final    BLOOD Performed at [place] [address] [place], [place] [address]    Special Requests   Final    BOTTLES DRAWN AEROBIC ONLY Performed at [place] [address] [place], [place] [address]    Culture   Final    NO GROWTH 4 DAYS Performed at [facility], [place] [address], [place], [place] [address]    Report Status PENDING  Incomplete  Culture, blood (routine x 2)     Status: None (Preliminary result)   Collection Time: 01/03/21 10:24 PM   Specimen: BLOOD RIGHT HAND  Result Value Ref Range Status   Specimen Description   Final    BLOOD RIGHT HAND Performed at [place] [address], [place], [place] [address]    Special Requests   Final    BOTTLES DRAWN AEROBIC AND ANAEROBIC Blood Culture adequate volume Performed at [place] [address] [place], [place] [address]    Culture   Final    NO GROWTH 4 DAYS Performed at [facility], [place] [address] [place], [place] [address]    Report Status PENDING  Incomplete      Radiology Studies: DG Abd 1 View  Result Date: 01/07/2021 CLINICAL DATA:  75 year old female status post feeding tube placement. EXAM: ABDOMEN - 1 VIEW COMPARISON:  Radiograph dated 01/07/2021. FINDINGS: Feeding tube with weighted tip  in the left upper abdomen likely in the proximal stomach. Small bilateral pleural effusions with bibasilar atelectasis. Pneumonia is not excluded. There is mild cardiomegaly. Pacemaker wires noted. Degenerative changes of spine. IMPRESSION: Feeding tube with weighted tip in the proximal stomach.  Electronically Signed   By: Anner Crete M.D.   On: 01/07/2021 17:53   MR BRAIN WO CONTRAST  Result Date: 01/09/2021 CLINICAL DATA:  Mental status change of unknown cause. EXAM: MRI HEAD WITHOUT CONTRAST TECHNIQUE: Multiplanar, multiecho pulse sequences of the brain and surrounding structures were obtained without intravenous contrast. COMPARISON:  Head CT January 06, 2021. FINDINGS: Brain: No acute infarction, hemorrhage, hydrocephalus, extra-axial collection or mass lesion. Remote infarcts in the bilateral cerebellar hemisphere with gliosis and volume loss, more pronounced on the right hemisphere. Remote small infarcts are also noted in the right thalamus and basal ganglia. Scattered foci of T2 hyperintensity are seen in the periventricular white matter may be related to chronic microangiopathic changes. T2 hyperintensity within the pons and right cerebral peduncle is likely related to wallerian degeneration. Vascular: Normal flow voids. Skull and upper cervical spine: Normal marrow signal. Sinuses/Orbits: Decreased volume of the left maxillary sinus. Bilateral lens surgery. IMPRESSION: 1. No acute intracranial abnormality. 2. Remote infarcts in the bilateral cerebellar hemisphere, right thalamus and basal ganglia, similar to prior. 3. Mild chronic microangiopathic changes. Electronically Signed   By: Pedro Earls M.D.   On: 01/09/2021 10:32   DG Abd Portable 1V  Result Date: 01/07/2021 CLINICAL DATA:  Re-attempted feeding tube placement. EXAM: PORTABLE ABDOMEN - 1 VIEW COMPARISON:  Prior today FINDINGS: Feeding tube is again seen entering the stomach, with tip in the gastric fundus or proximal body. No dilated bowel loops seen. Bibasilar pulmonary opacity again noted. IMPRESSION: Feeding tube tip remains in the proximal stomach. Electronically Signed   By: Marlaine Hind M.D.   On: 01/07/2021 17:47   DG Abd Portable 1V  Result Date: 01/07/2021 CLINICAL DATA:  Feeding tube placement  EXAM: PORTABLE ABDOMEN - 1 VIEW COMPARISON:  01/07/2021 FINDINGS: Feeding tube loops in the stomach with the tip in the fundus of the stomach. IMPRESSION: Feeding tube tip in the fundus of the stomach. Electronically Signed   By: Rolm Baptise M.D.   On: 01/07/2021 17:41   ECHOCARDIOGRAM COMPLETE  Result Date: 01/07/2021    ECHOCARDIOGRAM REPORT   Patient Name:   Glinda [place] Date of Exam: 01/07/2021 Medical Rec #:  [id number]     Height:       67.0 in Accession #:    [id number]    Weight:       174.4 lb Date of Birth:  03/06/1946     BSA:          1.908 m Patient Age:    16 years      BP:           169/45 mmHg Patient Gender: F             HR:           67 bpm. Exam Location:  Inpatient Procedure: 2D Echo Indications:    Congestive Heart Failure I50.9  History:        Patient has prior history of Echocardiogram examinations, most                 recent 11/01/2020. Stroke; Risk Factors:Diabetes, Hypertension                 and Former Smoker. Acute  Renal Failure, Covid 19.  Sonographer:    Leavy Cella Referring Phys: [place]  1. Left ventricular ejection fraction, by estimation, is 60 to 65%. The left ventricle has normal function. The left ventricle has no regional wall motion abnormalities. There is moderate left ventricular hypertrophy. Left ventricular diastolic parameters are indeterminate. Elevated left atrial pressure.  2. Right ventricular systolic function is normal. The right ventricular size is normal.  3. Left atrial size was mildly dilated.  4. A small pericardial effusion is present.  5. The mitral valve is normal in structure. Mild mitral valve regurgitation. No evidence of mitral stenosis.  6. The aortic valve is tricuspid. Aortic valve regurgitation is not visualized. Mild aortic valve sclerosis is present, with no evidence of aortic valve stenosis.  7. The inferior vena cava is normal in size with greater than 50% respiratory variability, suggesting right atrial  pressure of 3 mmHg. FINDINGS  Left Ventricle: Left ventricular ejection fraction, by estimation, is 60 to 65%. The left ventricle has normal function. The left ventricle has no regional wall motion abnormalities. The left ventricular internal cavity size was normal in size. There is  moderate left ventricular hypertrophy. Left ventricular diastolic parameters are indeterminate. Elevated left atrial pressure. Right Ventricle: The right ventricular size is normal. Right ventricular systolic function is normal. Left Atrium: Left atrial size was mildly dilated. Right Atrium: Right atrial size was normal in size. Pericardium: A small pericardial effusion is present. Mitral Valve: The mitral valve is normal in structure. Mild mitral annular calcification. Mild mitral valve regurgitation. No evidence of mitral valve stenosis. Tricuspid Valve: The tricuspid valve is normal in structure. Tricuspid valve regurgitation is mild . No evidence of tricuspid stenosis. Aortic Valve: The aortic valve is tricuspid. Aortic valve regurgitation is not visualized. Mild aortic valve sclerosis is present, with no evidence of aortic valve stenosis. Pulmonic Valve: The pulmonic valve was normal in structure. Pulmonic valve regurgitation is not visualized. No evidence of pulmonic stenosis. Aorta: The aortic root is normal in size and structure. Venous: The inferior vena cava is normal in size with greater than 50% respiratory variability, suggesting right atrial pressure of 3 mmHg. IAS/Shunts: No atrial level shunt detected by color flow Doppler. Additional Comments: A pacer wire is visualized.  LEFT VENTRICLE PLAX 2D LVIDd:         4.58 cm  Diastology LVIDs:         2.86 cm  LV e' medial:    4.13 cm/s LV PW:         1.51 cm  LV E/e' medial:  24.2 LV IVS:        1.37 cm  LV e' lateral:   4.35 cm/s LVOT diam:     1.80 cm  LV E/e' lateral: 23.0 LVOT Area:     2.54 cm  RIGHT VENTRICLE RV S prime:     13.80 cm/s TAPSE (M-mode): 2.2 cm LEFT ATRIUM          Index LA diam:    4.30 cm 2.25 cm/m   AORTA Ao Root diam: 2.30 cm MITRAL VALVE                TRICUSPID VALVE MV Area (PHT): 3.21 cm     TR Peak grad:   39.9 mmHg MV Decel Time: 236 msec     TR Vmax:        316.00 cm/s MR Peak grad:   131.3 mmHg MR Vmax:  573.00 cm/s SHUNTS MR PISA:        1.57 cm    Systemic Diam: 1.80 cm MR PISA Radius: 0.50 cm MV E velocity: 100.00 cm/s MV A velocity: 114.00 cm/s MV E/A ratio:  0.88 Kirk Ruths MD Electronically signed by Kirk Ruths MD Signature Date/Time: 01/07/2021/2:41:54 PM    Final       Scheduled Meds: . atorvastatin  80 mg Per Tube Daily  . carvedilol  25 mg Per Tube BID WC  . chlorhexidine  15 mL Mouth Rinse BID  . Chlorhexidine Gluconate Cloth  6 each Topical Q0600  . clopidogrel  75 mg Per Tube Daily  . diphenhydrAMINE  50 mg Intravenous Q8H  . dorzolamide-timolol  1 drop Both Eyes BID  . ferrous sulfate  300 mg Per Tube QODAY  . free water  200 mL Per Tube Q6H  . hydrALAZINE  25 mg Per Tube Q8H  . insulin aspart  0-9 Units Subcutaneous Q4H  . mouth rinse  15 mL Mouth Rinse q12n4p  . pantoprazole sodium  40 mg Per Tube Daily  . potassium chloride  40 mEq Per Tube BID   Continuous Infusions: . famotidine (PEPCID) IV Stopped (01/08/21 1801)     LOS: 6 days      Time spent: 30 minutes   Dessa Phi, DO [facility] 01/09/2021, 1:03 PM   Available via Epic secure chat 7am-7pm After these hours, please refer to coverage provider listed on [email or web address]

## 2021-01-09 NOTE — Progress Notes (Signed)
Initial Nutrition Assessment  INTERVENTION:   Monitor magnesium, potassium, and phosphorus daily for at least 3 days, MD to replete as needed, as pt is at risk for refeeding syndrome.  Via NGT: -Initiate Osmolite 1.5 @ 20 ml/hr, advance by 10 ml every 4 hours to goal rate of 60 ml/hr. -45 ml Prosource TF TID -Provides at goal: 2280 kcals, 123g protein and 1097 ml H2O.  -Free water per MD: 200 ml/hr every 6 hours (800 ml)  NUTRITION DIAGNOSIS:   Increased nutrient needs related to acute illness (COVID-19 infection) as evidenced by estimated needs.  GOAL:   Patient will meet greater than or equal to 90% of their needs  MONITOR:   Labs,Weight trends,TF tolerance,I & O's  REASON FOR ASSESSMENT:   Consult Enteral/tube feeding initiation and management  ASSESSMENT:   75 year old female with past medical history significant for previous stroke with residual left-sided weakness, expressive aphasia, dementia, CKD stage IV, diabetes who presents from her nursing facility due to altered mental status.  She was found to have hypernatremia as well as COVID-19.  She was started on Remdesivir.  There was initial concern for angioedema.  PCCM was consulted but did not feel that she required intubation.  1/20: admitted for AMS, COVID+ 1/25: NGT placed, tip in proximal stomach  Pt unable to give history d/t AMS. Given mental status, pt deemed unsafe for PO. Tube feeding to begin today via NGT. SLP has been unable to evaluate swallow function d/t lethargy. Pt has been NPO since 1/21.   Admission weight: 150 lbs. Current weight: 157 lbs. No weight loss noted. Per nursing documentation, pt with generalized edema.  Medications: KLOR-CON  Labs reviewed:  CBGs: 101-142 Low K  NUTRITION - FOCUSED PHYSICAL EXAM:  Unable to complete -will attempt at follow-up  Diet Order:   Diet Order            Diet NPO time specified  Diet effective now                 EDUCATION NEEDS:   No  education needs have been identified at this time  Skin:  Skin Assessment: Reviewed RN Assessment  Last BM:  1/26 -type 6  Height:   Ht Readings from Last 1 Encounters:  01/03/21 5\' 7"  (1.702 m)    Weight:   Wt Readings from Last 1 Encounters:  01/09/21 71.3 kg   BMI:  Body mass index is 24.62 kg/m.  Estimated Nutritional Needs:   Kcal:  2150-2350  Protein:  105-120g  Fluid:  2.1L/day  Clayton Bibles, MS, RD, LDN Inpatient Clinical Dietitian Contact information available via Amion

## 2021-01-09 NOTE — Progress Notes (Signed)
Gave report to [facility] via phone.

## 2021-01-09 NOTE — TOC Progression Note (Signed)
Transition of Care [facility]) - Progression Note    Patient Details  Name: Desiree Pollard MRN: [id number] Date of Birth: 07/09/1946  Transition of Care [facility]) CM/SW Contact  Leeroy Cha, RN Phone Number: 01/09/2021, 8:44 AM  Clinical Narrative:    New events last 24 hours / Subjective: Patient is responsive to voice, but does not answer any questions or follows commands. Plan: to return [place] where patient is a long term care patient.       Expected Discharge Plan and Services                                                 Social Determinants of Health (SDOH) Interventions    Readmission Risk Interventions No flowsheet data found.

## 2021-01-09 NOTE — Plan of Care (Signed)

## 2021-01-10 ENCOUNTER — Inpatient Hospital Stay (HOSPITAL_COMMUNITY): Payer: Medicare HMO

## 2021-01-10 DIAGNOSIS — E870 Hyperosmolality and hypernatremia: Secondary | ICD-10-CM | POA: Diagnosis not present

## 2021-01-10 LAB — GLUCOSE, CAPILLARY
Glucose-Capillary: 144 mg/dL — ABNORMAL HIGH (ref 70–99)
Glucose-Capillary: 157 mg/dL — ABNORMAL HIGH (ref 70–99)
Glucose-Capillary: 188 mg/dL — ABNORMAL HIGH (ref 70–99)
Glucose-Capillary: 194 mg/dL — ABNORMAL HIGH (ref 70–99)
Glucose-Capillary: 190 mg/dL — ABNORMAL HIGH (ref 70–99)
Glucose-Capillary: 143 mg/dL — ABNORMAL HIGH (ref 70–99)

## 2021-01-10 LAB — BASIC METABOLIC PANEL
Anion gap: 9 (ref 5–15)
BUN: 60 mg/dL — ABNORMAL HIGH (ref 8–23)
CO2: 20 mmol/L — ABNORMAL LOW (ref 22–32)
Calcium: 8.2 mg/dL — ABNORMAL LOW (ref 8.9–10.3)
Chloride: 112 mmol/L — ABNORMAL HIGH (ref 98–111)
Creatinine, Ser: 4.33 mg/dL — ABNORMAL HIGH (ref 0.44–1.00)
GFR, Estimated: 10 mL/min — ABNORMAL LOW (ref >60)
Glucose, Bld: 154 mg/dL — ABNORMAL HIGH (ref 70–99)
Potassium: 4.1 mmol/L (ref 3.5–5.1)
Sodium: 141 mmol/L (ref 135–145)

## 2021-01-10 LAB — PHOSPHORUS
Phosphorus: 4.5 mg/dL (ref 2.5–4.6)
Phosphorus: 4.5 mg/dL (ref 2.5–4.6)

## 2021-01-10 LAB — CBC
HCT: 32.1 % — ABNORMAL LOW (ref 36.0–46.0)
Hemoglobin: 9.8 g/dL — ABNORMAL LOW (ref 12.0–15.0)
MCH: 28.5 pg (ref 26.0–34.0)
MCHC: 30.5 g/dL (ref 30.0–36.0)
MCV: 93.3 fL (ref 80.0–100.0)
Platelets: 91 10*3/uL — ABNORMAL LOW (ref 150–400)
RBC: 3.44 MIL/uL — ABNORMAL LOW (ref 3.87–5.11)
RDW: 15.5 % (ref 11.5–15.5)
WBC: 6.2 10*3/uL (ref 4.0–10.5)
nRBC: 0 % (ref 0.0–0.2)

## 2021-01-10 LAB — MAGNESIUM
Magnesium: 2.1 mg/dL (ref 1.7–2.4)
Magnesium: 2.2 mg/dL (ref 1.7–2.4)

## 2021-01-10 IMAGING — US US RENAL
1 series · 14 of 25 positions shown · non-contrast
Comparison: None.

CLINICAL DATA: Acute kidney injury

EXAM:
RENAL / URINARY TRACT ULTRASOUND COMPLETE

[Series 1: us renal · 14 of 43 slices shown]
[im 1/43]
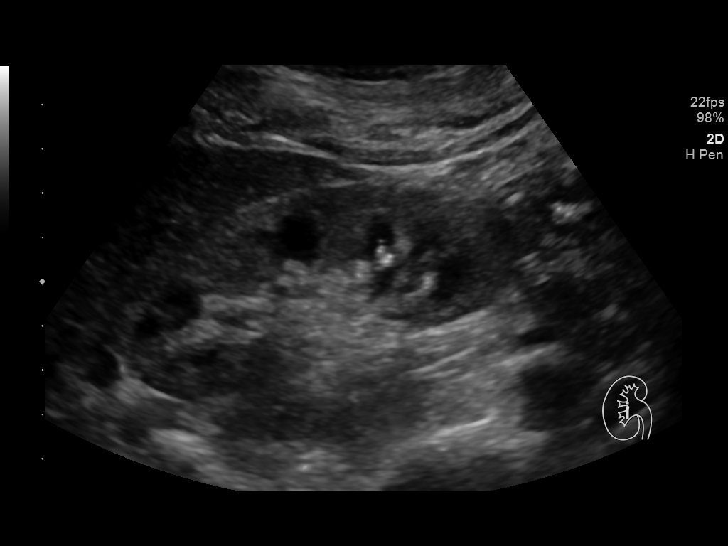
[im 4/43]
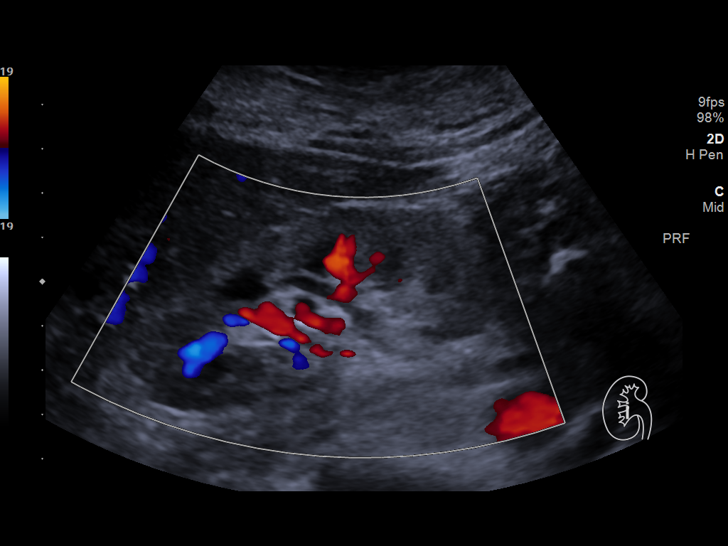
[im 8/43]
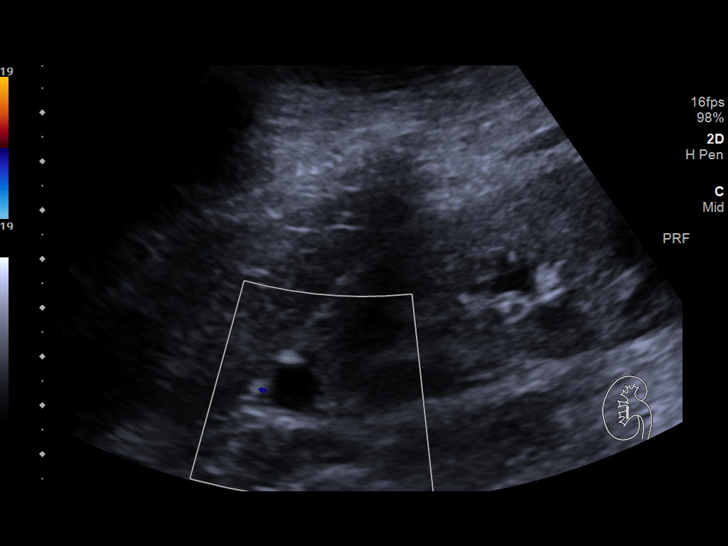
[im 11/43]
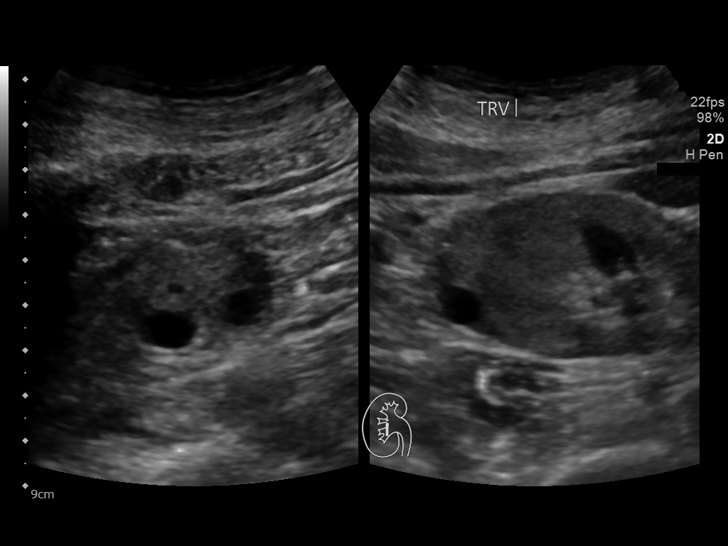
[im 15/43]
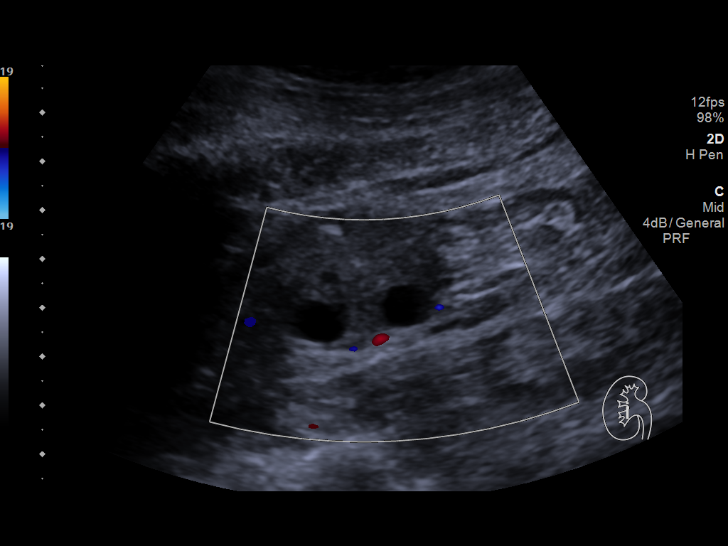
[im 16/43]
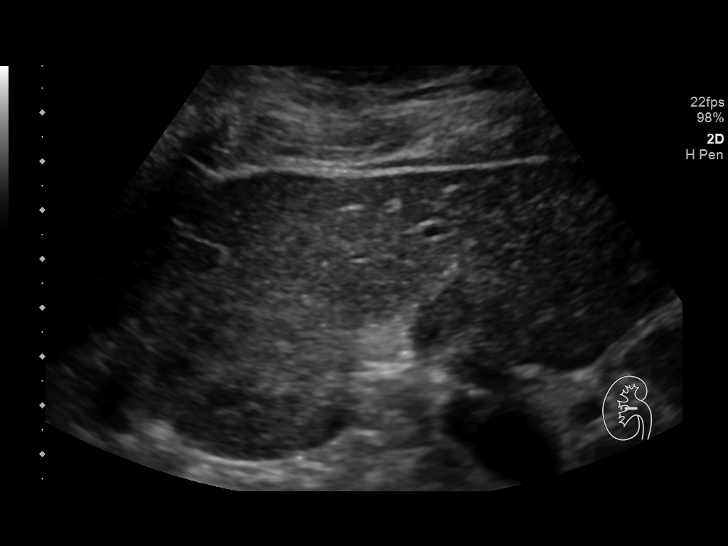
[im 20/43]
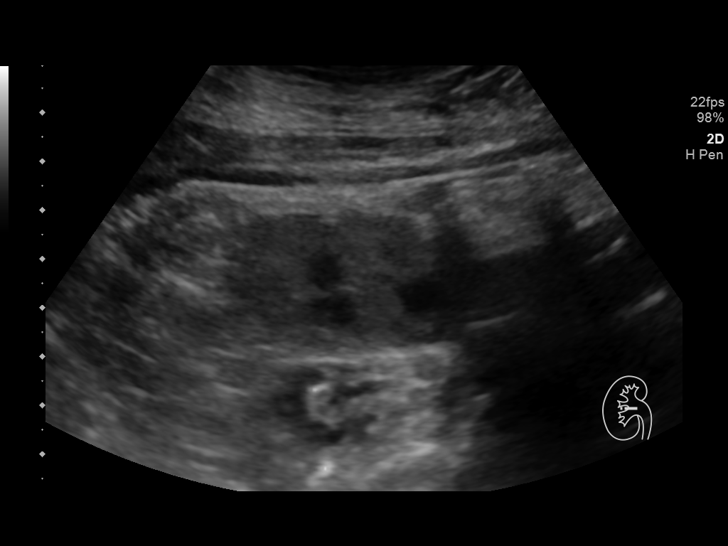
[im 23/43]
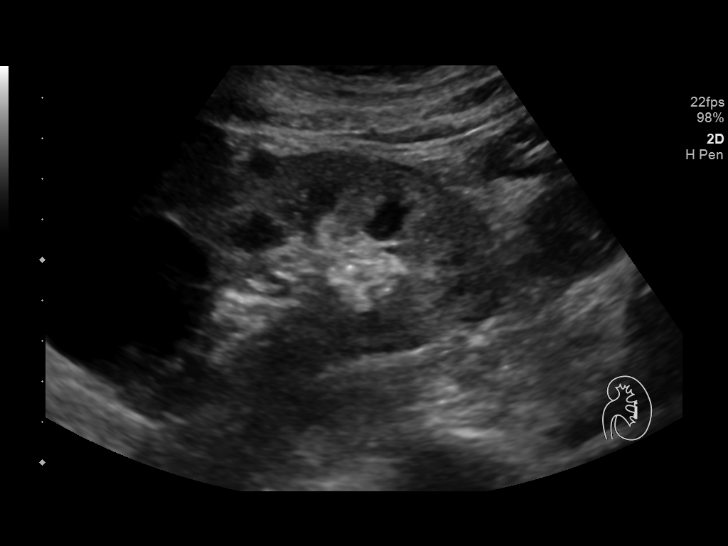
[im 27/43]
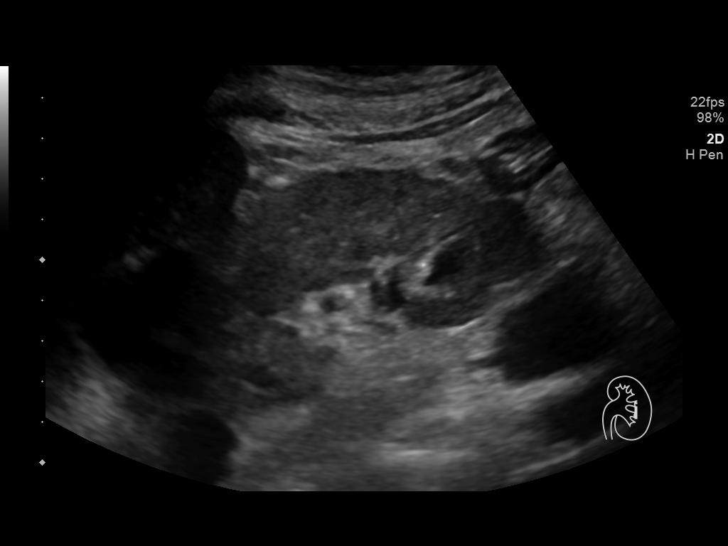
[im 29/43]
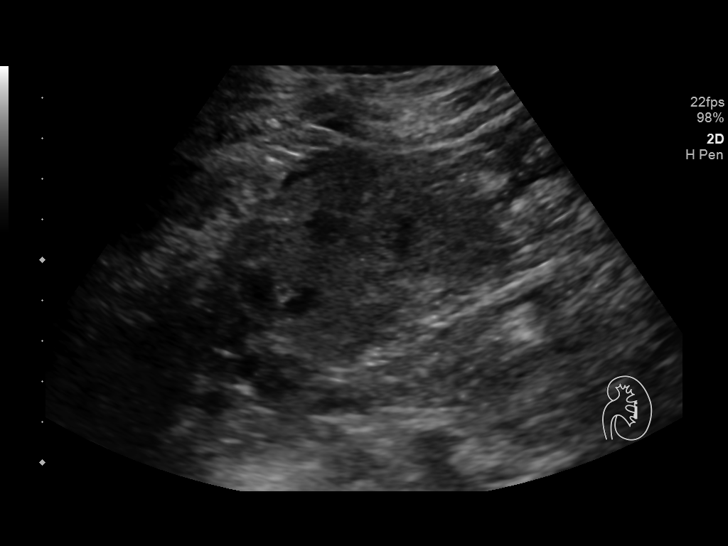
[im 32/43]
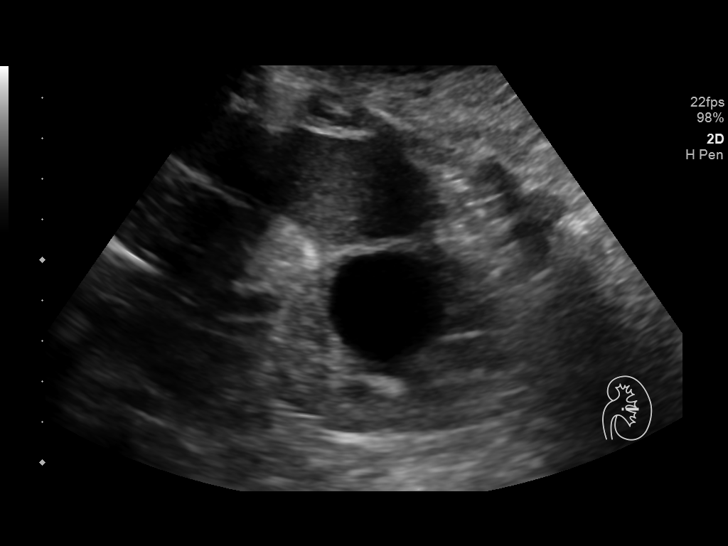
[im 36/43]
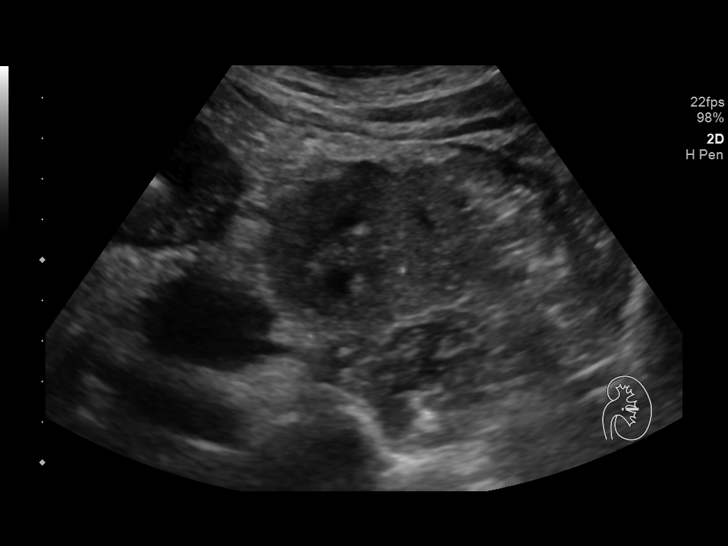
[im 39/43]
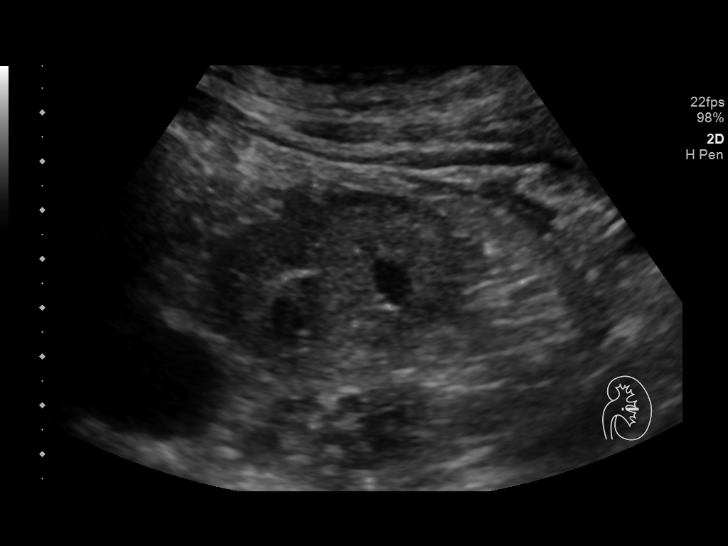
[im 43/43]
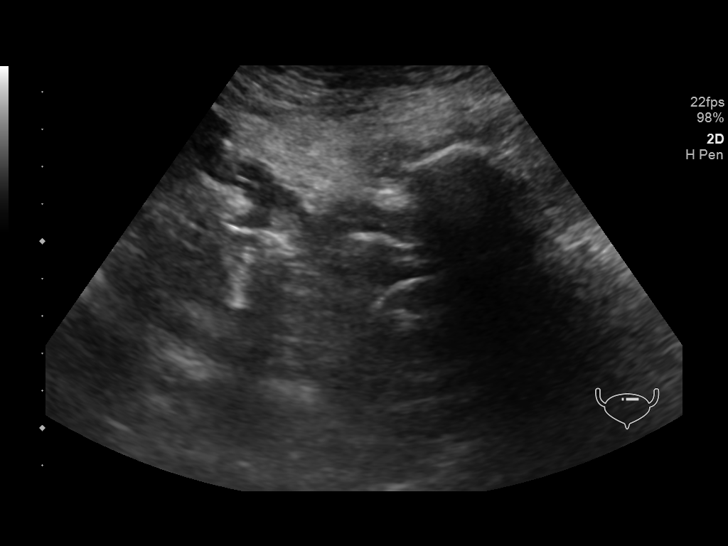

[14 of 25 positions shown; findings below may reference images not displayed]

FINDINGS: Right Kidney:

Renal measurements: 9.2 x 4.8 x 3.9 cm = volume: 90 mL. Echogenicity
within normal limits. Renal cysts measuring up to 1.5 cm. No solid
mass or hydronephrosis visualized.

Left Kidney:

Renal measurements: 10.0 x 5.9 x 5.3 cm = volume: 164 mL.
Echogenicity within normal limits. Renal cysts measuring up to
cm. 6 mm nonobstructive renal stone. No solid mass or hydronephrosis
visualized.

Bladder:

Decompressed around a Foley catheter.

Other:

None.
IMPRESSION: 1. No hydronephrosis.
2. Bilateral renal cysts.
3. 6 mm nonobstructive left renal stone.

## 2021-01-10 MED ORDER — SODIUM CHLORIDE 0.9 % IV SOLN: INTRAVENOUS | Status: DC

## 2021-01-10 NOTE — Progress Notes (Signed)
Patient rectal  temp at 2100 was 94.1. Pt alert and in no acute distress. remaning vs stable, 100/RA.  Bair hugger placed back on patient. Rectal temp recheck at this time and was 94.6. Will cont to monitor.

## 2021-01-10 NOTE — Care Management Important Message (Signed)
Important Message  Patient Details IM Letter placed in Patient's door Caddy. Name: Desiree Pollard MRN: [id number] Date of Birth: Jun 27, 1946   Medicare Important Message Given:  Yes     Kerin Salen 01/10/2021, 1:54 PM

## 2021-01-10 NOTE — Progress Notes (Signed)
  Speech Language Pathology Treatment: Dysphagia  Patient Details Name: Desiree Pollard MRN: [id number] DOB: 24-Oct-1946 Today's Date: 01/10/2021 Time: 3007-6226 SLP Time Calculation (min) (ACUTE ONLY): 30 min  Assessment / Plan / Recommendation Clinical Impression  Desiree Pollard was seen for skilled ST intervention targeting goals for PO readiness. Desiree Pollard was sleeping, open mouth breathing. Oral cavity was noted to be quite dry. Desiree Pollard awakened easily to verbal and gentle tactile stim. Oral care was completed. Desiree Pollard was able to tell me her name, with severely dysarthric speech noted. Minimal lingual movement noted. Desiree Pollard accepted individual ice chips x2. Poor oral manipulation was noted each time. Swallow reflex is suspected to be delayed. Slight cough response noted x1. Improvement noted in alertness and participation today, however, Desiree Pollard continues to be inappropriate for PO intake. Recommend continued NPO status with Cortrak tube feeds. SLP will continue to follow acutely to assess readiness for PO intake. RN informed. Supplies for oral suction were left for RN to set up, to facilitate thorough oral care.    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  Imaging of brain previously has shown Stable appearance of right frontal encephalomalacia and bilateral  lacunar type infarcts in the basal ganglia and cerebellar  hemispheres. . Expected evolution of encephalomalacia along the right  inferior cerebellum corresponding with a presumed subacute infarct  on comparison CT.  3. Chronic microvascular angiopathy and parenchymal volume loss.  CVAs have left Desiree Pollard with aphasia and left weakness per Admit note.  Most recent CXR concerning for potential infectious process.  Swallow eval ordered.      SLP Plan  Continue with current plan of care       Recommendations  Diet recommendations: NPO Medication Administration: Via  alternative means                Oral Care Recommendations: Oral care QID Follow up Recommendations: Other (comment) (tbd) SLP Visit Diagnosis: Dysphagia, oropharyngeal phase (R13.12) Plan: Continue with current plan of care       GO               Annalynn Centanni B. Quentin Ore, [facility], [place] Speech Language Pathologist Office: [phone] Pager: [phone]  Shonna Chock 01/10/2021, 11:40 AM

## 2021-01-10 NOTE — Progress Notes (Signed)
PROGRESS NOTE    [name]  TDV:[id number] DOB: Dec 23, 1945 DOA: 01/02/2021 PCP: [name], FNP     Brief Narrative:  Desiree Pollard is a 75 year old female with past medical history significant for previous stroke with residual left-sided weakness, expressive aphasia, dementia, CKD stage IV, diabetes who presents from her nursing facility due to altered mental status.  She was found to have hypernatremia as well as COVID-19.  She was started on Remdesivir.  There was initial concern for angioedema.  PCCM was consulted but did not feel that she required intubation.  MRI brain unremarkable for acute change.  New events last 24 hours / Subjective: Mental status about the same today.  She is responsive to voice and her name, able to follow simple commands such as squeezing my hand with her right hand but not with her left which was the same as yesterday.  She does not attempt to answer any questions.  Assessment & Plan:   Principal Problem:   Acute metabolic encephalopathy Active Problems:   Insulin dependent type 2 diabetes mellitus (HCC)   Hypertension   History of stroke   CKD (chronic kidney disease), stage IV ([place])   COVID-19 virus infection   Hypernatremia   Acute metabolic encephalopathy -Currently not at baseline.  At baseline, patient is conversant per sister.  On examination today, patient is alert and follows simple commands  -Possibly secondary to hypernatremia, AKI, COVID-19 in setting of underlying dementia -CT head x2 without acute intracranial abnormality -EEG suggestive of moderate to severe diffuse encephalopathy, no epileptiform discharges, seizures seen -MRI brain without acute intracranial abnormality  -Discussed with Dr. Curly [place] [facility] on 1/26. She recommended treating the treatable, hypernatremia, AKI, COVID. Also due to cefepime toxicity leading to AMS, to stop antibiotic if no longer needed. Continue to monitor closely. If patient has clinical  worsening, I will reach out again for a formal consult  -Slight improvement today but not at baseline, will continue to monitor  -Dietitian consulted to initiate tube feeding, was started 1/27  AKI on CKD stage IV -Baseline creatinine 2.6 -Hold off on further diuretic, nephrotoxins -Renal ultrasound obtained today without hydronephrosis.  Foley catheter was placed yesterday -IV fluids started -Monitor BMP, if no improvement may need to get nephrology involved  COVID-19 -Tested positive on 1/20 -Remdesivir x 3 doses -Supportive treatments as ordered, encourage IS, encourage prone positioning, encourage mobilization -Remains on room air today  COVID-19 Labs  No results for input(s): DDIMER, FERRITIN, LDH, CRP in the last 72 hours.  Lab Results  Component Value Date   SARSCOV2NAA POSITIVE (A) 01/02/2021   [place] NEGATIVE 11/24/2020   [place] NEGATIVE 10/28/2020   [place] NEGATIVE 10/25/2020    Hypernatremia -Likely secondary to poor p.o. intake -Continue free water -Resolved   Possible angioedema -Resolved, patient maintaining airway.  CT neck reassuring without significant swelling  HTN -Continue hydralazine   Chronic diastolic heart failure -EF 60-65%  -Hold off on further diuretics, continue Coreg  History of stroke -Diagnosed October 2021, with residual left-sided weakness and expressive aphasia -Continue Plavix, Lipitor  Fever -Blood cultures remain negative and remains afebrile, procal <0.1  -Now off cefepime     DVT prophylaxis:  Place and maintain sequential compression device Start: 01/02/21 2308  Code Status: Full code, confirmed with sister over the phone Family Communication: No family at bedside, called sister with an update but no answer and voice mailbox is full Disposition Plan:  Status is: Inpatient  Remains inpatient appropriate because:Altered mental status,  IV treatments appropriate due to intensity of illness or inability  to take PO and Inpatient level of care appropriate due to severity of illness   Dispo: The patient is from: SNF              Anticipated d/c is to: SNF              Anticipated d/c date is: > 3 days              Patient currently is not medically stable to d/c.   Difficult to place patient No   Consultants:   PCCM  Antimicrobials:  Anti-infectives (From admission, onward)   Start     Dose/Rate Route Frequency Ordered Stop   01/07/21 0800  ceFEPIme (MAXIPIME) 2 g in sodium chloride 0.9 % 100 mL IVPB  Status:  Discontinued        2 g 200 mL/hr over 30 Minutes Intravenous Daily 01/06/21 1159 01/08/21 1257   01/05/21 1500  vancomycin (VANCOCIN) IVPB 1000 mg/200 mL premix  Status:  Discontinued        1,000 mg 200 mL/hr over 60 Minutes Intravenous Every 24 hours 01/05/21 1357 01/05/21 1414   01/05/21 1500  vancomycin (VANCOCIN) IVPB 1000 mg/200 mL premix  Status:  Discontinued        1,000 mg 200 mL/hr over 60 Minutes Intravenous Every 48 hours 01/05/21 1414 01/06/21 1146   01/05/21 1445  ceFEPIme (MAXIPIME) 2 g in sodium chloride 0.9 % 100 mL IVPB  Status:  Discontinued        2 g 200 mL/hr over 30 Minutes Intravenous Daily 01/05/21 1351 01/06/21 1146   01/03/21 1000  remdesivir 100 mg in sodium chloride 0.9 % 100 mL IVPB  Status:  Discontinued       "Followed by" Linked Group Details   100 mg 200 mL/hr over 30 Minutes Intravenous Daily 01/02/21 2257 01/04/21 1330   01/02/21 2300  remdesivir 200 mg in sodium chloride 0.9% 250 mL IVPB       "Followed by" Linked Group Details   200 mg 580 mL/hr over 30 Minutes Intravenous Once 01/02/21 2257 01/03/21 0147       Objective: Vitals:   01/09/21 2000 01/09/21 2054 01/10/21 0100 01/10/21 0500  BP: (!) 147/60  (!) 151/72   Pulse: 72     Resp: (!) 8     Temp:  99.6 F (37.6 C) 99.3 F (37.4 C) 98.3 F (36.8 C)  TempSrc:  Rectal Rectal Rectal  SpO2: 98%     Weight:    74.5 kg  Height:        Intake/Output Summary (Last 24  hours) at 01/10/2021 1100 Last data filed at 01/10/2021 0834 Gross per 24 hour  Intake --  Output 900 ml  Net -900 ml   Filed Weights   01/08/21 0442 01/09/21 0500 01/10/21 0500  Weight: 79.1 kg 71.3 kg 74.5 kg    Examination: General exam: Appears calm and comfortable  Respiratory system: Clear to auscultation. Respiratory effort normal.  Remains on room air Cardiovascular system: S1 & S2 heard, RRR. No pedal edema. Gastrointestinal system: Abdomen is nondistended, soft and nontender. Normal bowel sounds heard. + Cortrak in place Central nervous system: Alert to voice, able to follow simple commands such as gripping my hand with her right hand.  Does not attempt to answer any questions in a meaningful way Extremities: Symmetric in appearance bilaterally    Data Reviewed: I have personally reviewed following labs and  imaging studies  CBC: Recent Labs  Lab 01/04/21 0143 01/05/21 0220 01/06/21 0214 01/07/21 0251 01/09/21 0442 01/10/21 0428  WBC 2.3* 3.9* 4.0 4.8 6.5 6.2  NEUTROABS 1.7 2.9 3.0 4.2  --   --   HGB 7.8* 7.9* 7.8* 9.7* 8.4* 9.8*  HCT 27.2* 26.6* 26.6* 32.4* 27.8* 32.1*  MCV 98.6 95.7 95.7 96.1 94.9 93.3  PLT 70* 78* 77* 115* 105* 91*   Basic Metabolic Panel: Recent Labs  Lab 01/07/21 1903 01/08/21 0539 01/08/21 1802 01/09/21 0442 01/10/21 0428  NA 150* 147* 143 142 141  K 3.7 3.6 4.4 3.1* 4.1  CL 115* 112* 111 111 112*  CO2 20* 22 23 19* 20*  GLUCOSE 184* 180* 147* 138* 154*  BUN 67* 68* 65* 63* 60*  CREATININE 4.24* 4.32* 4.38* 4.17* 4.33*  CALCIUM 8.8* 8.7* 8.2* 8.2* 8.2*  MG  --   --   --  2.1 2.1  PHOS  --   --   --  4.5 4.5   GFR: Estimated Creatinine Clearance: 11.7 mL/min (A) (by C-G formula based on SCr of 4.33 mg/dL (H)). Liver Function Tests: Recent Labs  Lab 01/04/21 0143 01/05/21 0220 01/06/21 0214 01/07/21 0251  AST 48* 40 36 30  ALT 74* 62* 52* 52*  ALKPHOS 136* 132* 123 151*  BILITOT 0.4 0.5 0.6 0.7  PROT 6.2* 6.0* 5.9* 7.0   ALBUMIN 2.7* 2.6* 2.4* 2.7*   No results for input(s): LIPASE, AMYLASE in the last 168 hours. Recent Labs  Lab 01/05/21 1304 01/06/21 1304  AMMONIA 36* 23   Coagulation Profile: No results for input(s): INR, PROTIME in the last 168 hours. Cardiac Enzymes: No results for input(s): CKTOTAL, CKMB, CKMBINDEX, TROPONINI in the last 168 hours. BNP (last 3 results) No results for input(s): PROBNP in the last 8760 hours. HbA1C: No results for input(s): HGBA1C in the last 72 hours. CBG: Recent Labs  Lab 01/09/21 1638 01/09/21 2019 01/09/21 2323 01/10/21 0335 01/10/21 0734  GLUCAP 110* 116* 135* 144* 157*   Lipid Profile: No results for input(s): CHOL, HDL, LDLCALC, TRIG, CHOLHDL, LDLDIRECT in the last 72 hours. Thyroid Function Tests: No results for input(s): TSH, T4TOTAL, FREET4, T3FREE, THYROIDAB in the last 72 hours. Anemia Panel: No results for input(s): VITAMINB12, FOLATE, FERRITIN, TIBC, IRON, RETICCTPCT in the last 72 hours. Sepsis Labs: Recent Labs  Lab 01/05/21 1632 01/06/21 0214 01/07/21 0251  PROCALCITON <0.10 <0.10 <0.10  LATICACIDVEN 0.7  --   --     Recent Results (from the past 240 hour(s))  Urine culture     Status: None   Collection Time: 01/02/21  8:53 PM   Specimen: Urine, Random  Result Value Ref Range Status   Specimen Description   Final    URINE, RANDOM Performed at [facility] [address], [place], [place] [address]    Special Requests   Final    NONE Performed at [facility], [place] [address], [place], [place] [address]    Culture   Final    NO GROWTH Performed at [facility], [place] [address], [place],  [address]    Report Status 01/04/2021 FINAL  Final  SARS Coronavirus 2 by RT PCR (hospital order, performed in [facility] hospital lab) Nasopharyngeal Nasopharyngeal Swab     Status: Abnormal   Collection Time: 01/02/21  9:00 PM   Specimen: Nasopharyngeal Swab  Result Value Ref  Range Status   SARS Coronavirus 2 POSITIVE (A) NEGATIVE Final  Comment: CRITICAL RESULT CALLED TO, READ BACK BY AND VERIFIED WITH: DR Regenia Skeeter AT 2245 01/02/21 CRU[place] A (NOTE) SARS-CoV-2 target nucleic acids are DETECTED  SARS-CoV-2 RNA is generally detectable in upper respiratory specimens  during the acute phase of infection.  Positive results are indicative  of the presence of the identified virus, but do not rule out bacterial infection or co-infection with other pathogens not detected by the test.  Clinical correlation with patient history and  other diagnostic information is necessary to determine patient infection status.  The expected result is negative.  Fact Sheet for Patients:   StrictlyIdeas.no   Fact Sheet for Healthcare Providers:   BankingDealers.co.za    This test is not yet approved or cleared by the [place] FDA and  has been authorized for detection and/or diagnosis of SARS-CoV-2 by FDA under an Emergency Use Authorization (EUA).  This EUA will remain in effect  (meaning this test can be used) for the duration of  the COVID-19 declaration under Section 564(b)(1) of the Act, 21 U.S.C. section 360-bbb-3(b)(1), unless the authorization is terminated or revoked sooner.  Performed at [facility], [place] [address] [place], [place] [address]   MRSA PCR Screening     Status: None   Collection Time: 01/03/21 10:02 PM   Specimen: Nasal Mucosa; Nasopharyngeal  Result Value Ref Range Status   MRSA by PCR NEGATIVE NEGATIVE Final    Comment:        The GeneXpert MRSA Assay (FDA approved for NASAL specimens only), is one component of a comprehensive MRSA colonization surveillance program. It is not intended to diagnose MRSA infection nor to guide or monitor treatment for MRSA infections. Performed at [facility], [place] [address] [place], [place] [address]   Culture,  blood (routine x 2)     Status: None   Collection Time: 01/03/21 10:15 PM   Specimen: BLOOD  Result Value Ref Range Status   Specimen Description   Final    BLOOD Performed at [facility] [address] [place], [place] [address]    Special Requests   Final    BOTTLES DRAWN AEROBIC ONLY Performed at [facility] [address] [place], [place] [address]    Culture   Final    NO GROWTH 5 DAYS Performed at [facility], [place] [address], [place], [place] [address]    Report Status 01/09/2021 FINAL  Final  Culture, blood (routine x 2)     Status: None   Collection Time: 01/03/21 10:24 PM   Specimen: BLOOD RIGHT HAND  Result Value Ref Range Status   Specimen Description   Final    BLOOD RIGHT HAND Performed at [facility] [address], [place], [place] [address]    Special Requests   Final    BOTTLES DRAWN AEROBIC AND ANAEROBIC Blood Culture adequate volume Performed at [facility] [address] [place], [place] [address]    Culture   Final    NO GROWTH 5 DAYS Performed at [facility], [place] [address] [place], [place] [address]    Report Status 01/09/2021 FINAL  Final      Radiology Studies: MR BRAIN WO CONTRAST  Result Date: 01/09/2021 CLINICAL DATA:  Mental status change of unknown cause. EXAM: MRI HEAD WITHOUT CONTRAST TECHNIQUE: Multiplanar, multiecho pulse sequences of the brain and surrounding structures were obtained without intravenous contrast. COMPARISON:  Head CT January 06, 2021. FINDINGS:  Brain: No acute infarction, hemorrhage, hydrocephalus, extra-axial collection or mass lesion. Remote infarcts in the bilateral cerebellar hemisphere with gliosis and volume loss, more pronounced on the right hemisphere. Remote small infarcts are also noted in the right thalamus and basal ganglia. Scattered foci of T2 hyperintensity are seen in the periventricular white matter may be  related to chronic microangiopathic changes. T2 hyperintensity within the pons and right cerebral peduncle is likely related to wallerian degeneration. Vascular: Normal flow voids. Skull and upper cervical spine: Normal marrow signal. Sinuses/Orbits: Decreased volume of the left maxillary sinus. Bilateral lens surgery. IMPRESSION: 1. No acute intracranial abnormality. 2. Remote infarcts in the bilateral cerebellar hemisphere, right thalamus and basal ganglia, similar to prior. 3. Mild chronic microangiopathic changes. Electronically Signed   By: Pedro Earls M.D.   On: 01/09/2021 10:32   US RENAL  Result Date: 01/10/2021 CLINICAL DATA:  Acute kidney injury EXAM: RENAL / URINARY TRACT ULTRASOUND COMPLETE COMPARISON:  None. FINDINGS: Right Kidney: Renal measurements: 9.2 x 4.8 x 3.9 cm = volume: 90 mL. Echogenicity within normal limits. Renal cysts measuring up to 1.5 cm. No solid mass or hydronephrosis visualized. Left Kidney: Renal measurements: 10.0 x 5.9 x 5.3 cm = volume: 164 mL. Echogenicity within normal limits. Renal cysts measuring up to 3.9 cm. 6 mm nonobstructive renal stone. No solid mass or hydronephrosis visualized. Bladder: Decompressed around a Foley catheter. Other: None. IMPRESSION: 1. No hydronephrosis. 2. Bilateral renal cysts. 3. 6 mm nonobstructive left renal stone. Electronically Signed   By: Dahlia Bailiff MD   On: 01/10/2021 10:30      Scheduled Meds: . atorvastatin  80 mg Per Tube Daily  . carvedilol  25 mg Per Tube BID WC  . chlorhexidine  15 mL Mouth Rinse BID  . Chlorhexidine Gluconate Cloth  6 each Topical Q0600  . clopidogrel  75 mg Per Tube Daily  . dorzolamide-timolol  1 drop Both Eyes BID  . feeding supplement (PROSource TF)  45 mL Per Tube TID  . ferrous sulfate  300 mg Per Tube QODAY  . free water  200 mL Per Tube Q6H  . hydrALAZINE  25 mg Per Tube Q8H  . insulin aspart  0-9 Units Subcutaneous Q4H  . mouth rinse  15 mL Mouth Rinse q12n4p  .  pantoprazole sodium  40 mg Per Tube Daily   Continuous Infusions: . sodium chloride    . famotidine (PEPCID) IV Stopped (01/09/21 1729)  . feeding supplement (OSMOLITE 1.5 CAL) 1,000 mL (01/09/21 1846)     LOS: 7 days      Time spent: 30 minutes   Dessa Phi, DO [facility] 01/10/2021, 11:00 AM   Available via Epic secure chat 7am-7pm After these hours, please refer to coverage provider listed on [email or web address]

## 2021-01-11 DIAGNOSIS — E870 Hyperosmolality and hypernatremia: Secondary | ICD-10-CM | POA: Diagnosis not present

## 2021-01-11 LAB — GLUCOSE, CAPILLARY
Glucose-Capillary: 185 mg/dL — ABNORMAL HIGH (ref 70–99)
Glucose-Capillary: 186 mg/dL — ABNORMAL HIGH (ref 70–99)
Glucose-Capillary: 194 mg/dL — ABNORMAL HIGH (ref 70–99)
Glucose-Capillary: 196 mg/dL — ABNORMAL HIGH (ref 70–99)
Glucose-Capillary: 221 mg/dL — ABNORMAL HIGH (ref 70–99)
Glucose-Capillary: 234 mg/dL — ABNORMAL HIGH (ref 70–99)

## 2021-01-11 LAB — BASIC METABOLIC PANEL
Anion gap: 8 (ref 5–15)
BUN: 69 mg/dL — ABNORMAL HIGH (ref 8–23)
CO2: 21 mmol/L — ABNORMAL LOW (ref 22–32)
Calcium: 8 mg/dL — ABNORMAL LOW (ref 8.9–10.3)
Chloride: 116 mmol/L — ABNORMAL HIGH (ref 98–111)
Creatinine, Ser: 3.87 mg/dL — ABNORMAL HIGH (ref 0.44–1.00)
GFR, Estimated: 12 mL/min — ABNORMAL LOW (ref >60)
Glucose, Bld: 241 mg/dL — ABNORMAL HIGH (ref 70–99)
Potassium: 3.8 mmol/L (ref 3.5–5.1)
Sodium: 145 mmol/L (ref 135–145)

## 2021-01-11 LAB — MAGNESIUM: Magnesium: 2.3 mg/dL (ref 1.7–2.4)

## 2021-01-11 LAB — PHOSPHORUS: Phosphorus: 3.9 mg/dL (ref 2.5–4.6)

## 2021-01-11 NOTE — Progress Notes (Signed)
   01/10/21 2100  Assess: MEWS Score  Temp (!) 94.1 F (34.5 C)  BP (!) 153/64  Pulse Rate 62  ECG Heart Rate 61  Resp 16  SpO2 100 %  O2 Device Room Air  Assess: MEWS Score  MEWS Temp 2  MEWS Systolic 0  MEWS Pulse 0  MEWS RR 0  MEWS LOC 0  MEWS Score 2  MEWS Score Color Yellow  Assess: if the MEWS score is Yellow or Red  Were vital signs taken at a resting state? Yes  Focused Assessment No change from prior assessment  Early Detection of Sepsis Score *See Row Information* Medium  MEWS guidelines implemented *See Row Information* No, previously yellow, continue vital signs every 4 hours  Treat  MEWS Interventions Other (Comment) (Bair Hugger applied)  Pain Scale PAINAD  Breathing 0  Negative Vocalization 0  Facial Expression 0  Body Language 0  Consolability 0  PAINAD Score 0  Notify: Charge Nurse/RN  Name of Charge Nurse/RN Notified Pam, RN  Date Charge Nurse/RN Notified 01/10/21  Time Charge Nurse/RN Notified 2300  Notify: Provider  Provider Name/Title Jeannette Corpus  Date Provider Notified 01/10/21  Time Provider Notified 2310  Notification Type Inks  Notification Reason Other (Comment) (To make aware)  Response No new orders

## 2021-01-11 NOTE — Plan of Care (Signed)
  Problem: Clinical Measurements: Goal: Ability to maintain clinical measurements within normal limits will improve Outcome: Progressing Goal: Will remain free from infection Outcome: Progressing Goal: Diagnostic test results will improve Outcome: Progressing Goal: Respiratory complications will improve Outcome: Progressing Goal: Cardiovascular complication will be avoided Outcome: Progressing   Problem: Nutrition: Goal: Adequate nutrition will be maintained Outcome: Progressing   Problem: Elimination: Goal: Will not experience complications related to bowel motility Outcome: Progressing Goal: Will not experience complications related to urinary retention Outcome: Progressing   Problem: Safety: Goal: Ability to remain free from injury will improve Outcome: Progressing   Problem: Skin Integrity: Goal: Risk for impaired skin integrity will decrease Outcome: Progressing   Problem: Respiratory: Goal: Will maintain a patent airway Outcome: Progressing Goal: Complications related to the disease process, condition or treatment will be avoided or minimized Outcome: Progressing

## 2021-01-11 NOTE — Progress Notes (Signed)
PROGRESS NOTE    Vermont Deason  CWU:[id number] DOB: Sep 21, 1946 DOA: 01/02/2021 PCP: [name], FNP     Brief Narrative:  Desiree Pollard is a 75 year old female with past medical history significant for previous stroke with residual left-sided weakness, expressive aphasia, dementia, CKD stage IV, diabetes who presents from her nursing facility due to altered mental status.  She was found to have hypernatremia as well as COVID-19.  She was started on Remdesivir.  There was initial concern for angioedema.  PCCM was consulted but did not feel that she required intubation.  MRI brain unremarkable for acute change.  New events last 24 hours / Subjective: Yesterday, she was able to be aroused easily and was able to say her name, accepting ice chips with SLP. This morning, she remains very lethargic. Able to open her eyes with multiple attempts at verbal and physical stimuli. Per sister, patient typically does not get up for the day until around 10-10:30 AM when she is woken up.  Assessment & Plan:   Principal Problem:   Acute metabolic encephalopathy Active Problems:   Insulin dependent type 2 diabetes mellitus (HCC)   Hypertension   History of stroke   CKD (chronic kidney disease), stage IV ([place])   COVID-19 virus infection   Hypernatremia   Acute metabolic encephalopathy -Currently not at baseline.  At baseline, patient is conversant per sister.  -Possibly secondary to hypernatremia, AKI, COVID-19 in setting of underlying dementia -CT head x2 without acute intracranial abnormality -EEG suggestive of moderate to severe diffuse encephalopathy, no epileptiform discharges, seizures seen -MRI brain without acute intracranial abnormality  -Discussed with Dr. [facility] [place] [facility] on 1/26. She recommended treating the treatable, hypernatremia, AKI, COVID. Also due to cefepime toxicity leading to AMS, to stop antibiotic if no longer needed. Continue to monitor closely. If patient has clinical  worsening, I will reach out again for a formal consult  -Still not at her baseline. -Dietitian consulted to initiate tube feeding, was started 1/27. SLP evaluation ongoing  AKI on CKD stage IV -Baseline creatinine 2.6 -Hold off on further diuretic, nephrotoxins -Renal ultrasound obtained without hydronephrosis -Creatinine improving, continue IV fluid  COVID-19 -Tested positive on 1/20 -Remdesivir x 3 doses -Supportive treatments as ordered, encourage IS, encourage prone positioning, encourage mobilization -Remains on room air today  COVID-19 Labs  No results for input(s): DDIMER, FERRITIN, LDH, CRP in the last 72 hours.  Lab Results  Component Value Date   SARSCOV2NAA POSITIVE (A) 01/02/2021   [place] NEGATIVE 11/24/2020   [place] NEGATIVE 10/28/2020   [place] NEGATIVE 10/25/2020    Hypernatremia -Likely secondary to poor p.o. intake -Continue free water -Resolved   Possible angioedema -Resolved, patient maintaining airway.  CT neck reassuring without significant swelling  HTN -Continue hydralazine   Chronic diastolic heart failure -EF 60-65%  -Hold off on further diuretics, continue Coreg  History of stroke -Diagnosed October 2021, with residual left-sided weakness and expressive aphasia -Continue Plavix, Lipitor  Fever -Blood cultures remain negative and remains afebrile, procal <0.1  -Now off cefepime -Been hypothermic, requiring Bair hugger    DVT prophylaxis:  Place and maintain sequential compression device Start: 01/02/21 2308  Code Status: Full code, confirmed with sister over the phone Family Communication: Updated sister over the phone today Disposition Plan:  Status is: Inpatient  Remains inpatient appropriate because:Altered mental status, IV treatments appropriate due to intensity of illness or inability to take PO and Inpatient level of care appropriate due to severity of illness   Dispo: The  patient is from: SNF               Anticipated d/c is to: SNF              Anticipated d/c date is: > 3 days              Patient currently is not medically stable to d/c.   Difficult to place patient No   Consultants:   PCCM  Antimicrobials:  Anti-infectives (From admission, onward)   Start     Dose/Rate Route Frequency Ordered Stop   01/07/21 0800  ceFEPIme (MAXIPIME) 2 g in sodium chloride 0.9 % 100 mL IVPB  Status:  Discontinued        2 g 200 mL/hr over 30 Minutes Intravenous Daily 01/06/21 1159 01/08/21 1257   01/05/21 1500  vancomycin (VANCOCIN) IVPB 1000 mg/200 mL premix  Status:  Discontinued        1,000 mg 200 mL/hr over 60 Minutes Intravenous Every 24 hours 01/05/21 1357 01/05/21 1414   01/05/21 1500  vancomycin (VANCOCIN) IVPB 1000 mg/200 mL premix  Status:  Discontinued        1,000 mg 200 mL/hr over 60 Minutes Intravenous Every 48 hours 01/05/21 1414 01/06/21 1146   01/05/21 1445  ceFEPIme (MAXIPIME) 2 g in sodium chloride 0.9 % 100 mL IVPB  Status:  Discontinued        2 g 200 mL/hr over 30 Minutes Intravenous Daily 01/05/21 1351 01/06/21 1146   01/03/21 1000  remdesivir 100 mg in sodium chloride 0.9 % 100 mL IVPB  Status:  Discontinued       "Followed by" Linked Group Details   100 mg 200 mL/hr over 30 Minutes Intravenous Daily 01/02/21 2257 01/04/21 1330   01/02/21 2300  remdesivir 200 mg in sodium chloride 0.9% 250 mL IVPB       "Followed by" Linked Group Details   200 mg 580 mL/hr over 30 Minutes Intravenous Once 01/02/21 2257 01/03/21 0147       Objective: Vitals:   01/10/21 2200 01/11/21 0020 01/11/21 0400 01/11/21 0700  BP: (!) 149/60 (!) 144/63 (!) 154/61   Pulse: 60 62 78   Resp: 14  (!) 22   Temp: (!) 94.6 F (34.8 C) (!) 97.4 F (36.3 C) 98.7 F (37.1 C) 98.1 F (36.7 C)  TempSrc: Rectal Rectal Oral Oral  SpO2: 100% 100% 98%   Weight:      Height:        Intake/Output Summary (Last 24 hours) at 01/11/2021 1228 Last data filed at 01/11/2021 0631 Gross per 24 hour  Intake  2998.89 ml  Output 626 ml  Net 2372.89 ml   Filed Weights   01/08/21 0442 01/09/21 0500 01/10/21 0500  Weight: 79.1 kg 71.3 kg 74.5 kg    Examination: General exam: Appears calm and comfortable, somnolent Respiratory system: Clear to auscultation. Respiratory effort normal. On room air Cardiovascular system: S1 & S2 heard, RRR. No pedal edema. Gastrointestinal system: Abdomen is nondistended, soft and nontender. Normal bowel sounds heard. Central nervous system: Arousable to physical stimuli Extremities: Symmetric in appearance bilaterally  Skin: No rashes, lesions or ulcers on exposed skin    Data Reviewed: I have personally reviewed following labs and imaging studies  CBC: Recent Labs  Lab 01/05/21 0220 01/06/21 0214 01/07/21 0251 01/09/21 0442 01/10/21 0428  WBC 3.9* 4.0 4.8 6.5 6.2  NEUTROABS 2.9 3.0 4.2  --   --   HGB 7.9* 7.8* 9.7* 8.4* 9.8*  HCT 26.6* 26.6* 32.4* 27.8* 32.1*  MCV 95.7 95.7 96.1 94.9 93.3  PLT 78* 77* 115* 105* 91*   Basic Metabolic Panel: Recent Labs  Lab 01/08/21 0539 01/08/21 1802 01/09/21 0442 01/10/21 0428 01/10/21 1633 01/11/21 0455  NA 147* 143 142 141  --  145  K 3.6 4.4 3.1* 4.1  --  3.8  CL 112* 111 111 112*  --  116*  CO2 22 23 19* 20*  --  21*  GLUCOSE 180* 147* 138* 154*  --  241*  BUN 68* 65* 63* 60*  --  69*  CREATININE 4.32* 4.38* 4.17* 4.33*  --  3.87*  CALCIUM 8.7* 8.2* 8.2* 8.2*  --  8.0*  MG  --   --  2.1 2.1 2.2 2.3  PHOS  --   --  4.5 4.5 4.5 3.9   GFR: Estimated Creatinine Clearance: 13 mL/min (A) (by C-G formula based on SCr of 3.87 mg/dL (H)). Liver Function Tests: Recent Labs  Lab 01/05/21 0220 01/06/21 0214 01/07/21 0251  AST 40 36 30  ALT 62* 52* 52*  ALKPHOS 132* 123 151*  BILITOT 0.5 0.6 0.7  PROT 6.0* 5.9* 7.0  ALBUMIN 2.6* 2.4* 2.7*   No results for input(s): LIPASE, AMYLASE in the last 168 hours. Recent Labs  Lab 01/05/21 1304 01/06/21 1304  AMMONIA 36* 23   Coagulation Profile: No  results for input(s): INR, PROTIME in the last 168 hours. Cardiac Enzymes: No results for input(s): CKTOTAL, CKMB, CKMBINDEX, TROPONINI in the last 168 hours. BNP (last 3 results) No results for input(s): PROBNP in the last 8760 hours. HbA1C: No results for input(s): HGBA1C in the last 72 hours. CBG: Recent Labs  Lab 01/10/21 2020 01/11/21 0021 01/11/21 0507 01/11/21 0721 01/11/21 1103  GLUCAP 143* 221* 196* 234* 185*   Lipid Profile: No results for input(s): CHOL, HDL, LDLCALC, TRIG, CHOLHDL, LDLDIRECT in the last 72 hours. Thyroid Function Tests: No results for input(s): TSH, T4TOTAL, FREET4, T3FREE, THYROIDAB in the last 72 hours. Anemia Panel: No results for input(s): VITAMINB12, FOLATE, FERRITIN, TIBC, IRON, RETICCTPCT in the last 72 hours. Sepsis Labs: Recent Labs  Lab 01/05/21 1632 01/06/21 0214 01/07/21 0251  PROCALCITON <0.10 <0.10 <0.10  LATICACIDVEN 0.7  --   --     Recent Results (from the past 240 hour(s))  Urine culture     Status: None   Collection Time: 01/02/21  8:53 PM   Specimen: Urine, Random  Result Value Ref Range Status   Specimen Description   Final    URINE, RANDOM Performed at [facility] [address] [place], [place] [address]    Special Requests   Final    NONE Performed at [facility], [place] [address] [place], [place] [address]    Culture   Final    NO GROWTH Performed at [facility], [place] [address] [place], [place] [address]    Report Status 01/04/2021 FINAL  Final  SARS Coronavirus 2 by RT PCR (hospital order, performed in [facility] hospital lab) Nasopharyngeal Nasopharyngeal Swab     Status: Abnormal   Collection Time: 01/02/21  9:00 PM   Specimen: Nasopharyngeal Swab  Result Value Ref Range Status   SARS Coronavirus 2 POSITIVE (A) NEGATIVE Final    Comment: CRITICAL RESULT CALLED TO, READ BACK BY AND VERIFIED WITH: DR Regenia Skeeter AT 2245 01/02/21 [place]  A (NOTE) SARS-CoV-2 target nucleic acids are DETECTED  SARS-CoV-2 RNA is generally detectable in upper  respiratory specimens  during the acute phase of infection.  Positive results are indicative  of the presence of the identified virus, but do not rule out bacterial infection or co-infection with other pathogens not detected by the test.  Clinical correlation with patient history and  other diagnostic information is necessary to determine patient infection status.  The expected result is negative.  Fact Sheet for Patients:   StrictlyIdeas.no   Fact Sheet for Healthcare Providers:   BankingDealers.co.za    This test is not yet approved or cleared by the [place] FDA and  has been authorized for detection and/or diagnosis of SARS-CoV-2 by FDA under an Emergency Use Authorization (EUA).  This EUA will remain in effect  (meaning this test can be used) for the duration of  the COVID-19 declaration under Section 564(b)(1) of the Act, 21 U.S.C. section 360-bbb-3(b)(1), unless the authorization is terminated or revoked sooner.  Performed at [facility], [place] [address], [place], [place] [address]   MRSA PCR Screening     Status: None   Collection Time: 01/03/21 10:02 PM   Specimen: Nasal Mucosa; Nasopharyngeal  Result Value Ref Range Status   MRSA by PCR NEGATIVE NEGATIVE Final    Comment:        The GeneXpert MRSA Assay (FDA approved for NASAL specimens only), is one component of a comprehensive MRSA colonization surveillance program. It is not intended to diagnose MRSA infection nor to guide or monitor treatment for MRSA infections. Performed at [facility], [place] [address], [place], [place] [address]   Culture, blood (routine x 2)     Status: None   Collection Time: 01/03/21 10:15 PM   Specimen: BLOOD  Result Value Ref Range Status   Specimen Description   Final    BLOOD Performed  at [facility] [address], [place], [place] [address]    Special Requests   Final    BOTTLES DRAWN AEROBIC ONLY Performed at [facility] [address], [place], [place] [address]    Culture   Final    NO GROWTH 5 DAYS Performed at [facility], [place] [address] [place], [place] [address]    Report Status 01/09/2021 FINAL  Final  Culture, blood (routine x 2)     Status: None   Collection Time: 01/03/21 10:24 PM   Specimen: BLOOD RIGHT HAND  Result Value Ref Range Status   Specimen Description   Final    BLOOD RIGHT HAND Performed at [facility] [address], [place], [place] [address]    Special Requests   Final    BOTTLES DRAWN AEROBIC AND ANAEROBIC Blood Culture adequate volume Performed at [facility] [address], [place], [place] [address]    Culture   Final    NO GROWTH 5 DAYS Performed at [facility], [place] [address], [place], [place] [address]    Report Status 01/09/2021 FINAL  Final      Radiology Studies: US RENAL  Result Date: 01/10/2021 CLINICAL DATA:  Acute kidney injury EXAM: RENAL / URINARY TRACT ULTRASOUND COMPLETE COMPARISON:  None. FINDINGS: Right Kidney: Renal measurements: 9.2 x 4.8 x 3.9 cm = volume: 90 mL. Echogenicity within normal limits. Renal cysts measuring up to 1.5 cm. No solid mass or hydronephrosis visualized. Left Kidney: Renal measurements: 10.0 x 5.9 x 5.3 cm = volume: 164 mL. Echogenicity within normal limits. Renal cysts measuring up to 3.9 cm.  6 mm nonobstructive renal stone. No solid mass or hydronephrosis visualized. Bladder: Decompressed around a Foley catheter. Other: None. IMPRESSION: 1. No hydronephrosis. 2. Bilateral renal cysts. 3. 6 mm nonobstructive left renal stone. Electronically Signed   By: Dahlia Bailiff MD   On: 01/10/2021 10:30      Scheduled Meds: . atorvastatin  80 mg Per Tube Daily  . carvedilol  25 mg Per Tube BID WC   . chlorhexidine  15 mL Mouth Rinse BID  . Chlorhexidine Gluconate Cloth  6 each Topical Q0600  . clopidogrel  75 mg Per Tube Daily  . dorzolamide-timolol  1 drop Both Eyes BID  . feeding supplement (PROSource TF)  45 mL Per Tube TID  . ferrous sulfate  300 mg Per Tube QODAY  . free water  200 mL Per Tube Q6H  . hydrALAZINE  25 mg Per Tube Q8H  . insulin aspart  0-9 Units Subcutaneous Q4H  . mouth rinse  15 mL Mouth Rinse q12n4p  . pantoprazole sodium  40 mg Per Tube Daily   Continuous Infusions: . sodium chloride 75 mL/hr at 01/11/21 1220  . famotidine (PEPCID) IV 20 mg (01/10/21 1634)  . feeding supplement (OSMOLITE 1.5 CAL) 1,000 mL (01/10/21 2122)     LOS: 8 days      Time spent: 30 minutes   Dessa Phi, DO [facility] 01/11/2021, 12:28 PM   Available via Epic secure chat 7am-7pm After these hours, please refer to coverage provider listed on [email or web address]

## 2021-01-12 DIAGNOSIS — E870 Hyperosmolality and hypernatremia: Secondary | ICD-10-CM | POA: Diagnosis not present

## 2021-01-12 LAB — BASIC METABOLIC PANEL
Anion gap: 8 (ref 5–15)
BUN: 70 mg/dL — ABNORMAL HIGH (ref 8–23)
CO2: 21 mmol/L — ABNORMAL LOW (ref 22–32)
Calcium: 8 mg/dL — ABNORMAL LOW (ref 8.9–10.3)
Chloride: 115 mmol/L — ABNORMAL HIGH (ref 98–111)
Creatinine, Ser: 3.66 mg/dL — ABNORMAL HIGH (ref 0.44–1.00)
GFR, Estimated: 12 mL/min — ABNORMAL LOW (ref >60)
Glucose, Bld: 219 mg/dL — ABNORMAL HIGH (ref 70–99)
Potassium: 3.9 mmol/L (ref 3.5–5.1)
Sodium: 144 mmol/L (ref 135–145)

## 2021-01-12 LAB — GLUCOSE, CAPILLARY
Glucose-Capillary: 152 mg/dL — ABNORMAL HIGH (ref 70–99)
Glucose-Capillary: 190 mg/dL — ABNORMAL HIGH (ref 70–99)
Glucose-Capillary: 194 mg/dL — ABNORMAL HIGH (ref 70–99)
Glucose-Capillary: 210 mg/dL — ABNORMAL HIGH (ref 70–99)
Glucose-Capillary: 217 mg/dL — ABNORMAL HIGH (ref 70–99)
Glucose-Capillary: 217 mg/dL — ABNORMAL HIGH (ref 70–99)
Glucose-Capillary: 233 mg/dL — ABNORMAL HIGH (ref 70–99)

## 2021-01-12 MED ORDER — NYSTATIN 100000 UNIT/ML MT SUSP
5.0000 mL | Freq: Four times a day (QID) | OROMUCOSAL | Status: DC
  Administered 2021-01-12 – 2021-02-19 (×142): 500000 [IU] via OROMUCOSAL
  Filled 2021-01-12 (×133): qty 5

## 2021-01-12 NOTE — Progress Notes (Signed)
  PROGRESS NOTE  Checked in on patient this afternoon. Upon entering room, patient opens her eyes to track me around the room. She is able to follow simple commands such as sticking her tongue out, gripping my hand with her right hand. Denies any pain but remains largely aphasic/nonverbal. Appears to be comfortable.   Dessa Phi, DO [facility] 01/12/2021, 2:34 PM  Available via Epic secure chat 7am-7pm After these hours, please refer to coverage provider listed on [email or web address]

## 2021-01-12 NOTE — Plan of Care (Signed)

## 2021-01-12 NOTE — Progress Notes (Signed)
PROGRESS NOTE    Desiree Pollard  YVO:[id number] DOB: 27-Aug-1946 DOA: 01/02/2021 PCP: [name], FNP     Brief Narrative:  Desiree Pollard is a 75 year old female with past medical history significant for previous stroke with residual left-sided weakness, expressive aphasia, dementia, CKD stage IV, diabetes who presents from her nursing facility due to altered mental status.  She was found to have hypernatremia as well as COVID-19.  She was started on Remdesivir.  There was initial concern for angioedema.  PCCM was consulted but did not feel that she required intubation.  MRI brain unremarkable for acute change.  New events last 24 hours / Subjective: More arousable this morning, mouthed "good morning" but easily fell back asleep and did not attempt to answer questions or follow commands.   Assessment & Plan:   Principal Problem:   Acute metabolic encephalopathy Active Problems:   Insulin dependent type 2 diabetes mellitus (HCC)   Hypertension   History of stroke   CKD (chronic kidney disease), stage IV ([place])   COVID-19 virus infection   Hypernatremia   Acute metabolic encephalopathy -At baseline, patient is conversant per sister.  -Possibly secondary to hypernatremia, AKI, COVID-19 in setting of underlying dementia -CT head x2 without acute intracranial abnormality -EEG suggestive of moderate to severe diffuse encephalopathy, no epileptiform discharges, seizures seen -MRI brain without acute intracranial abnormality  -Discussed with Dr. Curly [facility] on 1/26. She recommended treating the treatable, hypernatremia, AKI, COVID. Also due to cefepime toxicity leading to AMS, to stop antibiotic if no longer needed. Continue to monitor closely. If patient has clinical worsening, I will reach out again for a formal consult  -Still not at her baseline. -Dietitian consulted to initiate tube feeding, was started 1/27. SLP evaluation ongoing  AKI on CKD stage IV -Baseline creatinine  2.6 -Hold off on further diuretic, nephrotoxins -Renal ultrasound obtained without hydronephrosis -Creatinine improving, continue IV fluid  COVID-19 -Tested positive on 1/20 -Remdesivir x 3 doses -Supportive treatments as ordered, encourage IS, encourage prone positioning, encourage mobilization -Remains on room air today  COVID-19 Labs  No results for input(s): DDIMER, FERRITIN, LDH, CRP in the last 72 hours.  Lab Results  Component Value Date   SARSCOV2NAA POSITIVE (A) 01/02/2021   [place] NEGATIVE 11/24/2020   [place] NEGATIVE 10/28/2020   [place] NEGATIVE 10/25/2020    Hypernatremia -Likely secondary to poor p.o. intake -Continue free water -Resolved   Possible angioedema -Resolved, patient maintaining airway.  CT neck reassuring without significant swelling  HTN -Continue hydralazine   Chronic diastolic heart failure -EF 60-65%  -Hold off on further diuretics, continue Coreg  History of stroke -Diagnosed October 2021, with residual left-sided weakness and expressive aphasia -Continue Plavix, Lipitor  Fever -Blood cultures remain negative and remains afebrile, procal <0.1  -Now off cefepime -Been hypothermic, requiring Bair hugger    DVT prophylaxis:  Place and maintain sequential compression device Start: 01/02/21 2308  Code Status: Full code, confirmed with sister over the phone Family Communication: Updated sister over the phone today 1/30 Disposition Plan:  Status is: Inpatient  Remains inpatient appropriate because:Altered mental status, IV treatments appropriate due to intensity of illness or inability to take PO and Inpatient level of care appropriate due to severity of illness   Dispo: The patient is from: SNF              Anticipated d/c is to: SNF              Anticipated d/c date is: >  3 days              Patient currently is not medically stable to d/c.   Difficult to place patient No   Consultants:    PCCM  Antimicrobials:  Anti-infectives (From admission, onward)   Start     Dose/Rate Route Frequency Ordered Stop   01/07/21 0800  ceFEPIme (MAXIPIME) 2 g in sodium chloride 0.9 % 100 mL IVPB  Status:  Discontinued        2 g 200 mL/hr over 30 Minutes Intravenous Daily 01/06/21 1159 01/08/21 1257   01/05/21 1500  vancomycin (VANCOCIN) IVPB 1000 mg/200 mL premix  Status:  Discontinued        1,000 mg 200 mL/hr over 60 Minutes Intravenous Every 24 hours 01/05/21 1357 01/05/21 1414   01/05/21 1500  vancomycin (VANCOCIN) IVPB 1000 mg/200 mL premix  Status:  Discontinued        1,000 mg 200 mL/hr over 60 Minutes Intravenous Every 48 hours 01/05/21 1414 01/06/21 1146   01/05/21 1445  ceFEPIme (MAXIPIME) 2 g in sodium chloride 0.9 % 100 mL IVPB  Status:  Discontinued        2 g 200 mL/hr over 30 Minutes Intravenous Daily 01/05/21 1351 01/06/21 1146   01/03/21 1000  remdesivir 100 mg in sodium chloride 0.9 % 100 mL IVPB  Status:  Discontinued       "Followed by" Linked Group Details   100 mg 200 mL/hr over 30 Minutes Intravenous Daily 01/02/21 2257 01/04/21 1330   01/02/21 2300  remdesivir 200 mg in sodium chloride 0.9% 250 mL IVPB       "Followed by" Linked Group Details   200 mg 580 mL/hr over 30 Minutes Intravenous Once 01/02/21 2257 01/03/21 0147       Objective: Vitals:   01/12/21 0400 01/12/21 0419 01/12/21 0956 01/12/21 1020  BP:    (!) 140/59  Pulse: 70   70  Resp:    20  Temp: 99.3 F (37.4 C)  98.5 F (36.9 C)   TempSrc: Oral  Rectal   SpO2: 99%   100%  Weight:  76.1 kg    Height:        Intake/Output Summary (Last 24 hours) at 01/12/2021 1053 Last data filed at 01/12/2021 1030 Gross per 24 hour  Intake 2042.19 ml  Output 1100 ml  Net 942.19 ml   Filed Weights   01/09/21 0500 01/10/21 0500 01/12/21 0419  Weight: 71.3 kg 74.5 kg 76.1 kg    Examination: General exam: Appears calm and comfortable, lethargic  Respiratory system: Clear to auscultation.  Respiratory effort normal. Cardiovascular system: S1 & S2 heard, RRR. No pedal edema. Gastrointestinal system: Abdomen is nondistended, soft and nontender. Normal bowel sounds heard. Central nervous system: Arousable to voice but does not stay awake long enough to participate in exam  Extremities: Symmetric in appearance bilaterally  Skin: No rashes, lesions or ulcers on exposed skin    Data Reviewed: I have personally reviewed following labs and imaging studies  CBC: Recent Labs  Lab 01/06/21 0214 01/07/21 0251 01/09/21 0442 01/10/21 0428  WBC 4.0 4.8 6.5 6.2  NEUTROABS 3.0 4.2  --   --   HGB 7.8* 9.7* 8.4* 9.8*  HCT 26.6* 32.4* 27.8* 32.1*  MCV 95.7 96.1 94.9 93.3  PLT 77* 115* 105* 91*   Basic Metabolic Panel: Recent Labs  Lab 01/08/21 1802 01/09/21 0442 01/10/21 0428 01/10/21 1633 01/11/21 0455 01/12/21 0439  NA 143 142 141  --  145 144  K 4.4 3.1* 4.1  --  3.8 3.9  CL 111 111 112*  --  116* 115*  CO2 23 19* 20*  --  21* 21*  GLUCOSE 147* 138* 154*  --  241* 219*  BUN 65* 63* 60*  --  69* 70*  CREATININE 4.38* 4.17* 4.33*  --  3.87* 3.66*  CALCIUM 8.2* 8.2* 8.2*  --  8.0* 8.0*  MG  --  2.1 2.1 2.2 2.3  --   PHOS  --  4.5 4.5 4.5 3.9  --    GFR: Estimated Creatinine Clearance: 13.9 mL/min (A) (by C-G formula based on SCr of 3.66 mg/dL (H)). Liver Function Tests: Recent Labs  Lab 01/06/21 0214 01/07/21 0251  AST 36 30  ALT 52* 52*  ALKPHOS 123 151*  BILITOT 0.6 0.7  PROT 5.9* 7.0  ALBUMIN 2.4* 2.7*   No results for input(s): LIPASE, AMYLASE in the last 168 hours. Recent Labs  Lab 01/05/21 1304 01/06/21 1304  AMMONIA 36* 23   Coagulation Profile: No results for input(s): INR, PROTIME in the last 168 hours. Cardiac Enzymes: No results for input(s): CKTOTAL, CKMB, CKMBINDEX, TROPONINI in the last 168 hours. BNP (last 3 results) No results for input(s): PROBNP in the last 8760 hours. HbA1C: No results for input(s): HGBA1C in the last 72  hours. CBG: Recent Labs  Lab 01/11/21 1701 01/11/21 1938 01/12/21 0002 01/12/21 0413 01/12/21 0828  GLUCAP 194* 186* 152* 190* 194*   Lipid Profile: No results for input(s): CHOL, HDL, LDLCALC, TRIG, CHOLHDL, LDLDIRECT in the last 72 hours. Thyroid Function Tests: No results for input(s): TSH, T4TOTAL, FREET4, T3FREE, THYROIDAB in the last 72 hours. Anemia Panel: No results for input(s): VITAMINB12, FOLATE, FERRITIN, TIBC, IRON, RETICCTPCT in the last 72 hours. Sepsis Labs: Recent Labs  Lab 01/05/21 1632 01/06/21 0214 01/07/21 0251  PROCALCITON <0.10 <0.10 <0.10  LATICACIDVEN 0.7  --   --     Recent Results (from the past 240 hour(s))  Urine culture     Status: None   Collection Time: 01/02/21  8:53 PM   Specimen: Urine, Random  Result Value Ref Range Status   Specimen Description   Final    URINE, RANDOM Performed at [facility] [address], [place], [place] [address]    Special Requests   Final    NONE Performed at [facility], [place] [address] [place], [place] [address]    Culture   Final    NO GROWTH Performed at [facility], [place] [address] [place], [place] [address]    Report Status 01/04/2021 FINAL  Final  SARS Coronavirus 2 by RT PCR (hospital order, performed in [facility] hospital lab) Nasopharyngeal Nasopharyngeal Swab     Status: Abnormal   Collection Time: 01/02/21  9:00 PM   Specimen: Nasopharyngeal Swab  Result Value Ref Range Status   SARS Coronavirus 2 POSITIVE (A) NEGATIVE Final    Comment: CRITICAL RESULT CALLED TO, READ BACK BY AND VERIFIED WITH: DR Regenia Skeeter AT 2245 01/02/21 [place] A (NOTE) SARS-CoV-2 target nucleic acids are DETECTED  SARS-CoV-2 RNA is generally detectable in upper respiratory specimens  during the acute phase of infection.  Positive results are indicative  of the presence of the identified virus, but do not rule out bacterial infection or co-infection with other  pathogens not detected by the test.  Clinical correlation with patient history and  other diagnostic information is necessary to determine patient infection status.  The expected result is negative.  Fact Sheet for Patients:   StrictlyIdeas.no   Fact Sheet for Healthcare Providers:   BankingDealers.co.za    This test is not yet approved or cleared by the [place] FDA and  has been authorized for detection and/or diagnosis of SARS-CoV-2 by FDA under an Emergency Use Authorization (EUA).  This EUA will remain in effect  (meaning this test can be used) for the duration of  the COVID-19 declaration under Section 564(b)(1) of the Act, 21 U.S.C. section 360-bbb-3(b)(1), unless the authorization is terminated or revoked sooner.  Performed at [facility], [place] [address], [place], [place] [address]   MRSA PCR Screening     Status: None   Collection Time: 01/03/21 10:02 PM   Specimen: Nasal Mucosa; Nasopharyngeal  Result Value Ref Range Status   MRSA by PCR NEGATIVE NEGATIVE Final    Comment:        The GeneXpert MRSA Assay (FDA approved for NASAL specimens only), is one component of a comprehensive MRSA colonization surveillance program. It is not intended to diagnose MRSA infection nor to guide or monitor treatment for MRSA infections. Performed at [facility], [place] [address], [place], [place] [address]   Culture, blood (routine x 2)     Status: None   Collection Time: 01/03/21 10:15 PM   Specimen: BLOOD  Result Value Ref Range Status   Specimen Description   Final    BLOOD Performed at [facility] [address], [place], [place] [address]    Special Requests   Final    BOTTLES DRAWN AEROBIC ONLY Performed at [facility] [address], [place], [place] [address]    Culture   Final    NO GROWTH 5 DAYS Performed at [facility],  [place] [address] [place], [place] [address]    Report Status 01/09/2021 FINAL  Final  Culture, blood (routine x 2)     Status: None   Collection Time: 01/03/21 10:24 PM   Specimen: BLOOD RIGHT HAND  Result Value Ref Range Status   Specimen Description   Final    BLOOD RIGHT HAND Performed at [facility] [address], [place], [place] [address]    Special Requests   Final    BOTTLES DRAWN AEROBIC AND ANAEROBIC Blood Culture adequate volume Performed at [facility] [address] [place],  [address]    Culture   Final    NO GROWTH 5 DAYS Performed at [facility], [place] [address] [place],  [address]    Report Status 01/09/2021 FINAL  Final      Radiology Studies: No results found.    Scheduled Meds: . atorvastatin  80 mg Per Tube Daily  . carvedilol  25 mg Per Tube BID WC  . chlorhexidine  15 mL Mouth Rinse BID  . Chlorhexidine Gluconate Cloth  6 each Topical Q0600  . clopidogrel  75 mg Per Tube Daily  . dorzolamide-timolol  1 drop Both Eyes BID  . feeding supplement (PROSource TF)  45 mL Per Tube TID  . ferrous sulfate  300 mg Per Tube QODAY  . free water  200 mL Per Tube Q6H  . hydrALAZINE  25 mg Per Tube Q8H  . insulin aspart  0-9 Units Subcutaneous Q4H  . mouth rinse  15 mL Mouth Rinse q12n4p  . pantoprazole sodium  40 mg Per Tube Daily   Continuous Infusions: .  sodium chloride 75 mL/hr at 01/12/21 0231  . famotidine (PEPCID) IV 20 mg (01/11/21 1802)  . feeding supplement (OSMOLITE 1.5 CAL) 1,000 mL (01/11/21 1810)     LOS: 9 days      Time spent: 30 minutes   Dessa Phi, DO [facility] 01/12/2021, 10:53 AM   Available via Epic secure chat 7am-7pm After these hours, please refer to coverage provider listed on [email or web address]

## 2021-01-13 DIAGNOSIS — E870 Hyperosmolality and hypernatremia: Secondary | ICD-10-CM | POA: Diagnosis not present

## 2021-01-13 LAB — BASIC METABOLIC PANEL
Anion gap: 8 (ref 5–15)
BUN: 66 mg/dL — ABNORMAL HIGH (ref 8–23)
CO2: 19 mmol/L — ABNORMAL LOW (ref 22–32)
Calcium: 7.9 mg/dL — ABNORMAL LOW (ref 8.9–10.3)
Chloride: 114 mmol/L — ABNORMAL HIGH (ref 98–111)
Creatinine, Ser: 3.4 mg/dL — ABNORMAL HIGH (ref 0.44–1.00)
GFR, Estimated: 13 mL/min — ABNORMAL LOW (ref >60)
Glucose, Bld: 247 mg/dL — ABNORMAL HIGH (ref 70–99)
Potassium: 4.1 mmol/L (ref 3.5–5.1)
Sodium: 141 mmol/L (ref 135–145)

## 2021-01-13 LAB — GLUCOSE, CAPILLARY
Glucose-Capillary: 210 mg/dL — ABNORMAL HIGH (ref 70–99)
Glucose-Capillary: 228 mg/dL — ABNORMAL HIGH (ref 70–99)
Glucose-Capillary: 250 mg/dL — ABNORMAL HIGH (ref 70–99)
Glucose-Capillary: 256 mg/dL — ABNORMAL HIGH (ref 70–99)
Glucose-Capillary: 287 mg/dL — ABNORMAL HIGH (ref 70–99)

## 2021-01-13 MED ORDER — INSULIN GLARGINE 100 UNIT/ML ~~LOC~~ SOLN
5.0000 [IU] | Freq: Every day | SUBCUTANEOUS | Status: DC
  Administered 2021-01-13 – 2021-01-19 (×7): 5 [IU] via SUBCUTANEOUS
  Filled 2021-01-13 (×7): qty 0.05

## 2021-01-13 NOTE — Care Management Important Message (Signed)
Important Message  Patient Details IM Letter placed in Patient's door Caddy. Name: Desiree Pollard MRN: [id number] Date of Birth: 1946/12/03   Medicare Important Message Given:  Yes     Kerin Salen 01/13/2021, 1:28 PM

## 2021-01-13 NOTE — Progress Notes (Signed)
PROGRESS NOTE    Desiree Pollard  RDE:[id number] DOB: 1946/11/08 DOA: 01/02/2021 PCP: [name], FNP     Brief Narrative:  Desiree Pollard is a 75 year old female with past medical history significant for previous stroke with residual left-sided weakness, expressive aphasia, dementia, CKD stage IV, diabetes who presents from her nursing facility due to altered mental status.  She was found to have hypernatremia as well as COVID-19.  She was started on Remdesivir.  There was initial concern for angioedema.  PCCM was consulted but did not feel that she required intubation.  MRI brain unremarkable for acute change.  New events last 24 hours / Subjective: Easily arousable to voice, trying to mouth words but incomprehensible, follows command to squeeze hand with right hand.   Assessment & Plan:   Principal Problem:   Acute metabolic encephalopathy Active Problems:   Insulin dependent type 2 diabetes mellitus (HCC)   Hypertension   History of stroke   CKD (chronic kidney disease), stage IV ([place])   COVID-19 virus infection   Hypernatremia   Acute metabolic encephalopathy -At baseline, patient is conversant per sister.  -Possibly secondary to hypernatremia, AKI, COVID-19 in setting of underlying dementia -CT head x2 without acute intracranial abnormality -EEG suggestive of moderate to severe diffuse encephalopathy, no epileptiform discharges, seizures seen -MRI brain without acute intracranial abnormality  -Discussed with Dr. Curly [place] [facility]rology on 1/26. She recommended treating the treatable, hypernatremia, AKI, COVID. Also due to cefepime toxicity leading to AMS, to stop antibiotic if no longer needed. Continue to monitor closely. -Still not at her baseline. -Dietitian consulted to initiate tube feeding, was started 1/27. SLP evaluation ongoing  AKI on CKD stage IV -Baseline creatinine 2.6 -Hold off on further diuretic, nephrotoxins -Renal ultrasound obtained without  hydronephrosis -Creatinine improving, continue IV fluid  COVID-19 -Tested positive on 1/20 -Remdesivir x 3 doses -Supportive treatments as ordered, encourage IS, encourage prone positioning, encourage mobilization -Remains on room air today  COVID-19 Labs  No results for input(s): DDIMER, FERRITIN, LDH, CRP in the last 72 hours.  Lab Results  Component Value Date   SARSCOV2NAA POSITIVE (A) 01/02/2021   [place] NEGATIVE 11/24/2020   [place] NEGATIVE 10/28/2020   [place] NEGATIVE 10/25/2020    Hypernatremia -Likely secondary to poor p.o. intake -Continue free water -Resolved   Possible angioedema -Resolved, patient maintaining airway.  CT neck reassuring without significant swelling  HTN -Continue hydralazine   Chronic diastolic heart failure -EF 60-65%  -Hold off on further diuretics, continue Coreg  History of stroke -Diagnosed October 2021, with residual left-sided weakness and expressive aphasia -Continue Plavix, Lipitor  Fever -Blood cultures remain negative and remains afebrile, procal <0.1  -Now off cefepime -Been hypothermic, requiring Bair hugger    DVT prophylaxis:  Place and maintain sequential compression device Start: 01/02/21 2308  Code Status: Full code, confirmed with sister over the phone Family Communication: Updated sister over the phone today 1/31 Disposition Plan:  Status is: Inpatient  Remains inpatient appropriate because:Altered mental status, IV treatments appropriate due to intensity of illness or inability to take PO and Inpatient level of care appropriate due to severity of illness   Dispo: The patient is from: SNF              Anticipated d/c is to: SNF              Anticipated d/c date is: > 3 days              Patient currently is not  medically stable to d/c.   Difficult to place patient No   Consultants:   PCCM  Antimicrobials:  Anti-infectives (From admission, onward)   Start     Dose/Rate Route  Frequency Ordered Stop   01/07/21 0800  ceFEPIme (MAXIPIME) 2 g in sodium chloride 0.9 % 100 mL IVPB  Status:  Discontinued        2 g 200 mL/hr over 30 Minutes Intravenous Daily 01/06/21 1159 01/08/21 1257   01/05/21 1500  vancomycin (VANCOCIN) IVPB 1000 mg/200 mL premix  Status:  Discontinued        1,000 mg 200 mL/hr over 60 Minutes Intravenous Every 24 hours 01/05/21 1357 01/05/21 1414   01/05/21 1500  vancomycin (VANCOCIN) IVPB 1000 mg/200 mL premix  Status:  Discontinued        1,000 mg 200 mL/hr over 60 Minutes Intravenous Every 48 hours 01/05/21 1414 01/06/21 1146   01/05/21 1445  ceFEPIme (MAXIPIME) 2 g in sodium chloride 0.9 % 100 mL IVPB  Status:  Discontinued        2 g 200 mL/hr over 30 Minutes Intravenous Daily 01/05/21 1351 01/06/21 1146   01/03/21 1000  remdesivir 100 mg in sodium chloride 0.9 % 100 mL IVPB  Status:  Discontinued       "Followed by" Linked Group Details   100 mg 200 mL/hr over 30 Minutes Intravenous Daily 01/02/21 2257 01/04/21 1330   01/02/21 2300  remdesivir 200 mg in sodium chloride 0.9% 250 mL IVPB       "Followed by" Linked Group Details   200 mg 580 mL/hr over 30 Minutes Intravenous Once 01/02/21 2257 01/03/21 0147       Objective: Vitals:   01/12/21 2031 01/12/21 2345 01/13/21 0349 01/13/21 0615  BP:  (!) 149/56    Pulse:      Resp:      Temp: 97.7 F (36.5 C) 98.3 F (36.8 C) 99.5 F (37.5 C) 98.7 F (37.1 C)  TempSrc: Axillary Oral Oral Rectal  SpO2:      Weight:      Height:        Intake/Output Summary (Last 24 hours) at 01/13/2021 1227 Last data filed at 01/13/2021 0353 Gross per 24 hour  Intake 3501.46 ml  Output 575 ml  Net 2926.46 ml   Filed Weights   01/09/21 0500 01/10/21 0500 01/12/21 0419  Weight: 71.3 kg 74.5 kg 76.1 kg    Examination: General exam: Appears calm and comfortable  Respiratory system: Clear to auscultation. Respiratory effort normal. Cardiovascular system: S1 & S2 heard, RRR. No pedal  edema. Gastrointestinal system: Abdomen is nondistended, soft and nontender. Normal bowel sounds heard. Central nervous system: Alert to voice  Extremities: Symmetric in appearance bilaterally  Skin: No rashes, lesions or ulcers on exposed skin    Data Reviewed: I have personally reviewed following labs and imaging studies  CBC: Recent Labs  Lab 01/07/21 0251 01/09/21 0442 01/10/21 0428  WBC 4.8 6.5 6.2  NEUTROABS 4.2  --   --   HGB 9.7* 8.4* 9.8*  HCT 32.4* 27.8* 32.1*  MCV 96.1 94.9 93.3  PLT 115* 105* 91*   Basic Metabolic Panel: Recent Labs  Lab 01/09/21 0442 01/10/21 0428 01/10/21 1633 01/11/21 0455 01/12/21 0439 01/13/21 0427  NA 142 141  --  145 144 141  K 3.1* 4.1  --  3.8 3.9 4.1  CL 111 112*  --  116* 115* 114*  CO2 19* 20*  --  21* 21* 19*  GLUCOSE 138* 154*  --  241* 219* 247*  BUN 63* 60*  --  69* 70* 66*  CREATININE 4.17* 4.33*  --  3.87* 3.66* 3.40*  CALCIUM 8.2* 8.2*  --  8.0* 8.0* 7.9*  MG 2.1 2.1 2.2 2.3  --   --   PHOS 4.5 4.5 4.5 3.9  --   --    GFR: Estimated Creatinine Clearance: 15 mL/min (A) (by C-G formula based on SCr of 3.4 mg/dL (H)). Liver Function Tests: Recent Labs  Lab 01/07/21 0251  AST 30  ALT 52*  ALKPHOS 151*  BILITOT 0.7  PROT 7.0  ALBUMIN 2.7*   No results for input(s): LIPASE, AMYLASE in the last 168 hours. Recent Labs  Lab 01/06/21 1304  AMMONIA 23   Coagulation Profile: No results for input(s): INR, PROTIME in the last 168 hours. Cardiac Enzymes: No results for input(s): CKTOTAL, CKMB, CKMBINDEX, TROPONINI in the last 168 hours. BNP (last 3 results) No results for input(s): PROBNP in the last 8760 hours. HbA1C: No results for input(s): HGBA1C in the last 72 hours. CBG: Recent Labs  Lab 01/12/21 2003 01/12/21 2348 01/13/21 0347 01/13/21 0804 01/13/21 1118  GLUCAP 217* 233* 210* 228* 250*   Lipid Profile: No results for input(s): CHOL, HDL, LDLCALC, TRIG, CHOLHDL, LDLDIRECT in the last 72  hours. Thyroid Function Tests: No results for input(s): TSH, T4TOTAL, FREET4, T3FREE, THYROIDAB in the last 72 hours. Anemia Panel: No results for input(s): VITAMINB12, FOLATE, FERRITIN, TIBC, IRON, RETICCTPCT in the last 72 hours. Sepsis Labs: Recent Labs  Lab 01/07/21 0251  PROCALCITON <0.10    Recent Results (from the past 240 hour(s))  MRSA PCR Screening     Status: None   Collection Time: 01/03/21 10:02 PM   Specimen: Nasal Mucosa; Nasopharyngeal  Result Value Ref Range Status   MRSA by PCR NEGATIVE NEGATIVE Final    Comment:        The GeneXpert MRSA Assay (FDA approved for NASAL specimens only), is one component of a comprehensive MRSA colonization surveillance program. It is not intended to diagnose MRSA infection nor to guide or monitor treatment for MRSA infections. Performed at [facility], [place] [address] [place], [place] [address]   Culture, blood (routine x 2)     Status: None   Collection Time: 01/03/21 10:15 PM   Specimen: BLOOD  Result Value Ref Range Status   Specimen Description   Final    BLOOD Performed at [facility] [address], [place], [place] [address]    Special Requests   Final    BOTTLES DRAWN AEROBIC ONLY Performed at [facility] [address], [place], [place] [address]    Culture   Final    NO GROWTH 5 DAYS Performed at [facility], [place] [address], [place], [place] [address]    Report Status 01/09/2021 FINAL  Final  Culture, blood (routine x 2)     Status: None   Collection Time: 01/03/21 10:24 PM   Specimen: BLOOD RIGHT HAND  Result Value Ref Range Status   Specimen Description   Final    BLOOD RIGHT HAND Performed at [facility] [address], [place], [place] [address]    Special Requests   Final    BOTTLES DRAWN AEROBIC AND ANAEROBIC Blood Culture adequate volume Performed at [facility] [address]  [place], [place] [address]    Culture   Final  NO GROWTH 5 DAYS Performed at [facility], [place] [address] [place], [place] [address]    Report Status 01/09/2021 FINAL  Final      Radiology Studies: No results found.    Scheduled Meds: . atorvastatin  80 mg Per Tube Daily  . carvedilol  25 mg Per Tube BID WC  . chlorhexidine  15 mL Mouth Rinse BID  . Chlorhexidine Gluconate Cloth  6 each Topical Q0600  . clopidogrel  75 mg Per Tube Daily  . dorzolamide-timolol  1 drop Both Eyes BID  . feeding supplement (PROSource TF)  45 mL Per Tube TID  . ferrous sulfate  300 mg Per Tube QODAY  . free water  200 mL Per Tube Q6H  . hydrALAZINE  25 mg Per Tube Q8H  . insulin aspart  0-9 Units Subcutaneous Q4H  . mouth rinse  15 mL Mouth Rinse q12n4p  . nystatin  5 mL Mouth/Throat QID  . pantoprazole sodium  40 mg Per Tube Daily   Continuous Infusions: . sodium chloride 75 mL/hr at 01/13/21 0512  . famotidine (PEPCID) IV 20 mg (01/12/21 1614)  . feeding supplement (OSMOLITE 1.5 CAL) 1,000 mL (01/12/21 1610)     LOS: 10 days      Time spent: 30 minutes   Dessa Phi, DO [facility] 01/13/2021, 12:27 PM   Available via Epic secure chat 7am-7pm After these hours, please refer to coverage provider listed on [email or web address]

## 2021-01-13 NOTE — Progress Notes (Signed)
  Speech Language Pathology Treatment: Dysphagia  Patient Details Name: Desiree Pollard MRN: [id number] DOB: 1946-11-03 Today's Date: 01/13/2021 Time: 0272-5366 SLP Time Calculation (min) (ACUTE ONLY): 20 min  Assessment / Plan / Recommendation Clinical Impression  Patient seen to address dysphagia goals and determine readiness for PO intake. Patient lethargic but opens eyes when cued, responded correctly with her first name. Patient continues with open mouth tongue protruding posture. SLP provided extensive oral care to remove secretions on lips and tongue. Patient then given small amount water soaked toothette swabs, however she did not demonstrate any attempts to manipulate or swallow this and SLP suctioned excess. RN entered room and informed her of continued NPO recommendations. SLP will continue to follow for PO readiness.    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  Imaging of brain previously has shown Stable appearance of right frontal encephalomalacia and bilateral  lacunar type infarcts in the basal ganglia and cerebellar  hemispheres. . Expected evolution of encephalomalacia along the right  inferior cerebellum corresponding with a presumed subacute infarct  on comparison CT.  3. Chronic microvascular angiopathy and parenchymal volume loss.  CVAs have left pt with aphasia and left weakness per Admit note.  Most recent CXR concerning for potential infectious process.  Swallow eval ordered.      SLP Plan  Continue with current plan of care       Recommendations  Diet recommendations: NPO Medication Administration: Via alternative means                Oral Care Recommendations: Oral care QID Follow up Recommendations: 24 hour supervision/assistance;Skilled Nursing facility SLP Visit Diagnosis: Dysphagia, oropharyngeal phase (R13.12) Plan: Continue with current  plan of care       GO               [name], MA, CCC-SLP Speech Therapy

## 2021-01-13 NOTE — Plan of Care (Signed)

## 2021-01-14 ENCOUNTER — Inpatient Hospital Stay (HOSPITAL_COMMUNITY): Payer: Medicare HMO

## 2021-01-14 DIAGNOSIS — E870 Hyperosmolality and hypernatremia: Secondary | ICD-10-CM | POA: Diagnosis not present

## 2021-01-14 LAB — BASIC METABOLIC PANEL
Anion gap: 9 (ref 5–15)
BUN: 74 mg/dL — ABNORMAL HIGH (ref 8–23)
CO2: 20 mmol/L — ABNORMAL LOW (ref 22–32)
Calcium: 8.3 mg/dL — ABNORMAL LOW (ref 8.9–10.3)
Chloride: 116 mmol/L — ABNORMAL HIGH (ref 98–111)
Creatinine, Ser: 3.23 mg/dL — ABNORMAL HIGH (ref 0.44–1.00)
GFR, Estimated: 14 mL/min — ABNORMAL LOW (ref >60)
Glucose, Bld: 225 mg/dL — ABNORMAL HIGH (ref 70–99)
Potassium: 4.2 mmol/L (ref 3.5–5.1)
Sodium: 145 mmol/L (ref 135–145)

## 2021-01-14 LAB — GLUCOSE, CAPILLARY
Glucose-Capillary: 103 mg/dL — ABNORMAL HIGH (ref 70–99)
Glucose-Capillary: 103 mg/dL — ABNORMAL HIGH (ref 70–99)
Glucose-Capillary: 109 mg/dL — ABNORMAL HIGH (ref 70–99)
Glucose-Capillary: 111 mg/dL — ABNORMAL HIGH (ref 70–99)
Glucose-Capillary: 140 mg/dL — ABNORMAL HIGH (ref 70–99)
Glucose-Capillary: 193 mg/dL — ABNORMAL HIGH (ref 70–99)
Glucose-Capillary: 266 mg/dL — ABNORMAL HIGH (ref 70–99)
Glucose-Capillary: 84 mg/dL (ref 70–99)

## 2021-01-14 MED ORDER — DEXTROSE-NACL 5-0.45 % IV SOLN
INTRAVENOUS | Status: DC
  Administered 2021-01-15: 75 mL/h via INTRAVENOUS

## 2021-01-14 NOTE — Progress Notes (Signed)
Attempt to place NG tube unsuccessful due to resistance in nasal passage. Pt did not tolerate attempt well. Pt now resting comfortably, will attempt again later.

## 2021-01-14 NOTE — Progress Notes (Signed)
PROGRESS NOTE    [name]  MCN:[id number] DOB: [birth year] DOA: 01/02/2021 PCP: [name], FNP     Brief Narrative:  Desiree Pollard is a 75 year old female with past medical history significant for previous stroke with residual left-sided weakness, expressive aphasia, dementia, CKD stage IV, diabetes who presents from her nursing facility due to altered mental status.  She was found to have hypernatremia as well as COVID-19.  She was started on Remdesivir.  There was initial concern for angioedema.  PCCM was consulted but did not feel that she required intubation.  MRI brain unremarkable for acute change. Hospitalization complicated by continued encephalopathy.   New events last 24 hours / Subjective: Alert with me entering the room, able to tell me her full name, although speech remains difficult to understand. 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 Patient removed NG tube overnight.   Assessment & Plan:   Principal Problem:   Acute metabolic encephalopathy Active Problems:   Insulin dependent type 2 diabetes mellitus (HCC)   Hypertension   History of stroke   CKD (chronic kidney disease), stage IV ([place])   COVID-19 virus infection   Hypernatremia   Acute metabolic encephalopathy -At baseline, patient is conversant per sister. Able to walk with walker at baseline.  -Possibly secondary to hypernatremia, AKI, COVID-19 in setting of underlying dementia -CT head x2 without acute intracranial abnormality -EEG suggestive of moderate to severe diffuse encephalopathy, no epileptiform discharges, seizures seen -MRI brain without acute intracranial abnormality  -Discussed with Dr. Curly [place] Neurology on 1/26. She recommended treating the treatable, hypernatremia, AKI, COVID. Also due to cefepime toxicity leading to AMS, to stop antibiotic if no longer needed. Continue to monitor closely. -Still not at her baseline. -Dietitian consulted to initiate tube feeding,  was started 1/27 - 1/31. Leave feeding tube out for now, SLP eval ongoing. Hopefully mentation continues to improve and can advance diet   AKI on CKD stage IV -Baseline creatinine 2.6 -Hold off on further diuretic, nephrotoxins -Renal ultrasound obtained without hydronephrosis -Creatinine improving, continue IV fluid  COVID-19 -Tested positive on 1/20 -Remdesivir x 3 doses -Supportive treatments as ordered, encourage IS, encourage prone positioning, encourage mobilization -Remains on room air today  COVID-19 Labs  No results for input(s): DDIMER, FERRITIN, LDH, CRP in the last 72 hours.  Lab Results  Component Value Date   SARSCOV2NAA POSITIVE (A) 01/02/2021   [place] NEGATIVE 11/24/2020   [place] NEGATIVE 10/28/2020   [place] NEGATIVE 10/25/2020    Hypernatremia -Likely secondary to poor p.o. intake -Continue free water -Resolved   Possible angioedema -Resolved, patient maintaining airway.  CT neck reassuring without significant swelling  HTN -Continue hydralazine   Chronic diastolic heart failure -EF 60-65%  -Hold off on further diuretics, continue Coreg  History of stroke -Diagnosed October 2021, with residual left-sided weakness and expressive aphasia -Continue Plavix, Lipitor  Fever -Blood cultures remain negative and remains afebrile, procal <0.1  -Now off cefepime -Been hypothermic, requiring Bair hugger    DVT prophylaxis:  Place and maintain sequential compression device Start: 01/02/21 2308  Code Status: Full code, confirmed with sister over the phone Family Communication: Updated sister over the phone daily, last update 2/1  Disposition Plan:  Status is: Inpatient  Remains inpatient appropriate because:Altered mental status, IV treatments appropriate due to intensity of illness or inability to take PO and Inpatient level of care appropriate due to severity of illness   Dispo: The patient is from: SNF  Anticipated d/c  is to: SNF              Anticipated d/c date is: > 3 days              Patient currently is not medically stable to d/c.   Difficult to place patient No   Consultants:   PCCM  Antimicrobials:  Anti-infectives (From admission, onward)   Start     Dose/Rate Route Frequency Ordered Stop   01/07/21 0800  ceFEPIme (MAXIPIME) 2 g in sodium chloride 0.9 % 100 mL IVPB  Status:  Discontinued        2 g 200 mL/hr over 30 Minutes Intravenous Daily 01/06/21 1159 01/08/21 1257   01/05/21 1500  vancomycin (VANCOCIN) IVPB 1000 mg/200 mL premix  Status:  Discontinued        1,000 mg 200 mL/hr over 60 Minutes Intravenous Every 24 hours 01/05/21 1357 01/05/21 1414   01/05/21 1500  vancomycin (VANCOCIN) IVPB 1000 mg/200 mL premix  Status:  Discontinued        1,000 mg 200 mL/hr over 60 Minutes Intravenous Every 48 hours 01/05/21 1414 01/06/21 1146   01/05/21 1445  ceFEPIme (MAXIPIME) 2 g in sodium chloride 0.9 % 100 mL IVPB  Status:  Discontinued        2 g 200 mL/hr over 30 Minutes Intravenous Daily 01/05/21 1351 01/06/21 1146   01/03/21 1000  remdesivir 100 mg in sodium chloride 0.9 % 100 mL IVPB  Status:  Discontinued       "Followed by" Linked Group Details   100 mg 200 mL/hr over 30 Minutes Intravenous Daily 01/02/21 2257 01/04/21 1330   01/02/21 2300  remdesivir 200 mg in sodium chloride 0.9% 250 mL IVPB       "Followed by" Linked Group Details   200 mg 580 mL/hr over 30 Minutes Intravenous Once 01/02/21 2257 01/03/21 0147       Objective: Vitals:   01/13/21 1421 01/13/21 1850 01/13/21 2045 01/14/21 0700  BP: (!) 147/61  (!) 141/58   Pulse: 74     Resp: 20     Temp: 98 F (36.7 C) 98.1 F (36.7 C) 97.6 F (36.4 C) (!) 97.4 F (36.3 C)  TempSrc: Rectal Rectal Axillary   SpO2: 98%     Weight:      Height:        Intake/Output Summary (Last 24 hours) at 01/14/2021 1042 Last data filed at 01/14/2021 0700 Gross per 24 hour  Intake --  Output 850 ml  Net -850 ml   Filed Weights    01/09/21 0500 01/10/21 0500 01/12/21 0419  Weight: 71.3 kg 74.5 kg 76.1 kg    Examination: General exam: Appears calm and comfortable  Respiratory system: Clear to auscultation. Respiratory effort normal. On room air  Cardiovascular system: S1 & S2 heard, RRR. No pedal edema. Gastrointestinal system: Abdomen is nondistended, soft and nontender. Central nervous system: Alert and oriented x 1  Extremities: Symmetric in appearance bilaterally  Skin: No rashes, lesions or ulcers on exposed skin    Data Reviewed: I have personally reviewed following labs and imaging studies  CBC: Recent Labs  Lab 01/09/21 0442 01/10/21 0428  WBC 6.5 6.2  HGB 8.4* 9.8*  HCT 27.8* 32.1*  MCV 94.9 93.3  PLT 105* 91*   Basic Metabolic Panel: Recent Labs  Lab 01/09/21 0442 01/10/21 0428 01/10/21 1633 01/11/21 0455 01/12/21 0439 01/13/21 0427 01/14/21 0422  NA 142 141  --  145 144 141  145  K 3.1* 4.1  --  3.8 3.9 4.1 4.2  CL 111 112*  --  116* 115* 114* 116*  CO2 19* 20*  --  21* 21* 19* 20*  GLUCOSE 138* 154*  --  241* 219* 247* 225*  BUN 63* 60*  --  69* 70* 66* 74*  CREATININE 4.17* 4.33*  --  3.87* 3.66* 3.40* 3.23*  CALCIUM 8.2* 8.2*  --  8.0* 8.0* 7.9* 8.3*  MG 2.1 2.1 2.2 2.3  --   --   --   PHOS 4.5 4.5 4.5 3.9  --   --   --    GFR: Estimated Creatinine Clearance: 15.8 mL/min (A) (by C-G formula based on SCr of 3.23 mg/dL (H)). Liver Function Tests: No results for input(s): AST, ALT, ALKPHOS, BILITOT, PROT, ALBUMIN in the last 168 hours. No results for input(s): LIPASE, AMYLASE in the last 168 hours. No results for input(s): AMMONIA in the last 168 hours. Coagulation Profile: No results for input(s): INR, PROTIME in the last 168 hours. Cardiac Enzymes: No results for input(s): CKTOTAL, CKMB, CKMBINDEX, TROPONINI in the last 168 hours. BNP (last 3 results) No results for input(s): PROBNP in the last 8760 hours. HbA1C: No results for input(s): HGBA1C in the last 72  hours. CBG: Recent Labs  Lab 01/13/21 1656 01/13/21 2036 01/14/21 0016 01/14/21 0457 01/14/21 0755  GLUCAP 256* 287* 266* 193* 140*   Lipid Profile: No results for input(s): CHOL, HDL, LDLCALC, TRIG, CHOLHDL, LDLDIRECT in the last 72 hours. Thyroid Function Tests: No results for input(s): TSH, T4TOTAL, FREET4, T3FREE, THYROIDAB in the last 72 hours. Anemia Panel: No results for input(s): VITAMINB12, FOLATE, FERRITIN, TIBC, IRON, RETICCTPCT in the last 72 hours. Sepsis Labs: No results for input(s): PROCALCITON, LATICACIDVEN in the last 168 hours.  No results found for this or any previous visit (from the past 240 hour(s)).    Radiology Studies: No results found.    Scheduled Meds: . atorvastatin  80 mg Per Tube Daily  . carvedilol  25 mg Per Tube BID WC  . chlorhexidine  15 mL Mouth Rinse BID  . Chlorhexidine Gluconate Cloth  6 each Topical Q0600  . clopidogrel  75 mg Per Tube Daily  . dorzolamide-timolol  1 drop Both Eyes BID  . feeding supplement (PROSource TF)  45 mL Per Tube TID  . ferrous sulfate  300 mg Per Tube QODAY  . free water  200 mL Per Tube Q6H  . hydrALAZINE  25 mg Per Tube Q8H  . insulin aspart  0-9 Units Subcutaneous Q4H  . insulin glargine  5 Units Subcutaneous QHS  . mouth rinse  15 mL Mouth Rinse q12n4p  . nystatin  5 mL Mouth/Throat QID  . pantoprazole sodium  40 mg Per Tube Daily   Continuous Infusions: . sodium chloride 75 mL/hr at 01/14/21 0647  . famotidine (PEPCID) IV 20 mg (01/13/21 1531)  . feeding supplement (OSMOLITE 1.5 CAL) 1,000 mL (01/12/21 1610)     LOS: 11 days      Time spent: 30 minutes   Dessa Phi, DO [facility] 01/14/2021, 10:42 AM   Available via Epic secure chat 7am-7pm After these hours, please refer to coverage provider listed on [email or web address]

## 2021-01-14 NOTE — Progress Notes (Signed)
Nurse called RRT because nurse was unable to place NG tube. I attempted to place NG tube in right and left  Nare and I was unsuccessful due to resistance in nasal passage.

## 2021-01-14 NOTE — Progress Notes (Signed)
Pt pulled out feeding tube, orders received to replace tube. Nurse will attempt to replace. Pt resting comfortably, no distress noted.

## 2021-01-15 DIAGNOSIS — E870 Hyperosmolality and hypernatremia: Secondary | ICD-10-CM | POA: Diagnosis not present

## 2021-01-15 LAB — CBC
HCT: 24.7 % — ABNORMAL LOW (ref 36.0–46.0)
Hemoglobin: 7.3 g/dL — ABNORMAL LOW (ref 12.0–15.0)
MCH: 28.6 pg (ref 26.0–34.0)
MCHC: 29.6 g/dL — ABNORMAL LOW (ref 30.0–36.0)
MCV: 96.9 fL (ref 80.0–100.0)
Platelets: 146 10*3/uL — ABNORMAL LOW (ref 150–400)
RBC: 2.55 MIL/uL — ABNORMAL LOW (ref 3.87–5.11)
RDW: 15.2 % (ref 11.5–15.5)
WBC: 5.7 10*3/uL (ref 4.0–10.5)
nRBC: 0 % (ref 0.0–0.2)

## 2021-01-15 LAB — BASIC METABOLIC PANEL
Anion gap: 10 (ref 5–15)
BUN: 67 mg/dL — ABNORMAL HIGH (ref 8–23)
CO2: 18 mmol/L — ABNORMAL LOW (ref 22–32)
Calcium: 8.6 mg/dL — ABNORMAL LOW (ref 8.9–10.3)
Chloride: 118 mmol/L — ABNORMAL HIGH (ref 98–111)
Creatinine, Ser: 3.06 mg/dL — ABNORMAL HIGH (ref 0.44–1.00)
GFR, Estimated: 15 mL/min — ABNORMAL LOW (ref >60)
Glucose, Bld: 131 mg/dL — ABNORMAL HIGH (ref 70–99)
Potassium: 4.4 mmol/L (ref 3.5–5.1)
Sodium: 146 mmol/L — ABNORMAL HIGH (ref 135–145)

## 2021-01-15 LAB — GLUCOSE, CAPILLARY
Glucose-Capillary: 119 mg/dL — ABNORMAL HIGH (ref 70–99)
Glucose-Capillary: 125 mg/dL — ABNORMAL HIGH (ref 70–99)
Glucose-Capillary: 122 mg/dL — ABNORMAL HIGH (ref 70–99)
Glucose-Capillary: 130 mg/dL — ABNORMAL HIGH (ref 70–99)
Glucose-Capillary: 138 mg/dL — ABNORMAL HIGH (ref 70–99)
Glucose-Capillary: 127 mg/dL — ABNORMAL HIGH (ref 70–99)

## 2021-01-15 MED ORDER — METOPROLOL TARTRATE 5 MG/5ML IV SOLN
2.5000 mg | Freq: Three times a day (TID) | INTRAVENOUS | Status: DC
  Administered 2021-01-15 – 2021-01-16 (×2): 2.5 mg via INTRAVENOUS
  Filled 2021-01-15 (×2): qty 5

## 2021-01-15 MED ORDER — HEPARIN SODIUM (PORCINE) 5000 UNIT/ML IJ SOLN
5000.0000 [IU] | Freq: Three times a day (TID) | INTRAMUSCULAR | Status: DC
  Administered 2021-01-15 – 2021-02-10 (×77): 5000 [IU] via SUBCUTANEOUS
  Filled 2021-01-15 (×76): qty 1

## 2021-01-15 NOTE — Progress Notes (Signed)
Nutrition Follow-up  DOCUMENTATION CODES:   Not applicable  INTERVENTION:  - diet advancement as medically feasible. - if diet unable to be advanced in the next 24-48 hours, recommend replacement of small bore NGT and resume TF.   NUTRITION DIAGNOSIS:   Increased nutrient needs related to acute illness (COVID-19 infection) as evidenced by estimated needs. -ongoing  GOAL:   Patient will meet greater than or equal to 90% of their needs -unmet/unable to meet at this time.   MONITOR:   Diet advancement,Labs,Weight trends  ASSESSMENT:   75 year old female with past medical history significant for previous stroke with residual left-sided weakness, expressive aphasia, dementia, CKD stage IV, diabetes who presents from her nursing facility due to altered mental status.  She was found to have hypernatremia as well as COVID-19.  She was started on Remdesivir.  There was initial concern for angioedema.  PCCM was consulted but did not feel that she required intubation.  Significant Events: 1/20- admission 1/25- NGT placement (gastric) 1/27- initial RD assessment 2/1- patient pulled NGT; unsuccessful attempts at NGT replacement    Patient has been NPO since admission. Patient is a/o to self only. She pulled NGT yesterday and it was unable to be replaced. MD note yesterday states hopefulness of ability to advance diet.  Weight has mainly been stable throughout admission. Non-pitting edema to BUE and mild pitting edema to BLE documented in the edema section of flow sheet.   Patient remains on precautions for COVID.    Labs reviewed; CBGs: 119, 125, 122 mg/dl, Na: 146 mmol/l, Cl: 118 mmol/l, BUN: 67 mg/dl, creatinine: 3.06 mg/dl, Ca: 8.6 mg/dl, GFR: 15 ml/min.  Medications reviewed; 20 mg IV pepcid/day, sliding scale novolog, 5 units lantus/day, 5 ml mycostatin QID, 40 mg protonix/day.  IVF; D5-1/2 NS @ 75 ml/hr (306 kcal/24 hours).   Diet Order:   Diet Order            Diet NPO time  specified  Diet effective now                 EDUCATION NEEDS:   No education needs have been identified at this time  Skin:  Skin Assessment: Reviewed RN Assessment  Last BM:  2/1 (type 5)  Height:   Ht Readings from Last 1 Encounters:  01/03/21 5\' 7"  (1.702 m)    Weight:   Wt Readings from Last 1 Encounters:  01/15/21 79.3 kg    Estimated Nutritional Needs:  Kcal:  2150-2350 Protein:  105-120g Fluid:  2.1L/day      Jarome Matin, MS, RD, LDN, CNSC Inpatient Clinical Dietitian RD pager # available in AMION  After hours/weekend pager # available in [facility]

## 2021-01-15 NOTE — Plan of Care (Signed)

## 2021-01-15 NOTE — Progress Notes (Signed)
PROGRESS NOTE  Vermont Riding JSE:[id number] DOB: [birth year] DOA: 01/02/2021 PCP: [name], FNP  HPI/Recap of past 24 hours: Desiree Pollard is a 75 year old female with past medical history significant for previous stroke with residual left-sided weakness, expressive aphasia, dementia, CKD stage IV, diabetes who presents from her nursing facility due to altered mental status.  She was found to have hypernatremia as well as COVID-19.  She was started on Remdesivir.  There was initial concern for angioedema.  PCCM was consulted but did not feel that she required intubation.  MRI brain unremarkable for acute change. Hospitalization complicated by continued encephalopathy.  01/15/21: Seen and examined at bedside this morning. She is minimally interactive, somnolent but arousable to voices.  Assessment/Plan: Principal Problem:   Acute metabolic encephalopathy Active Problems:   Insulin dependent type 2 diabetes mellitus (HCC)   Hypertension   History of stroke   CKD (chronic kidney disease), stage IV ([place])   COVID-19 virus infection   Hypernatremia  Acute metabolic encephalopathy -At baseline, patient is conversant per sister. Able to walk with walker at baseline.  -Possibly secondary to hypernatremia, AKI, COVID-19 in setting of underlying dementia -CT head x2 without acute intracranial abnormality -EEG suggestive of moderate to severe diffuse encephalopathy, no epileptiform discharges, seizures seen -MRI brain without acute intracranial abnormality  -Discussed with Dr. Curly [place] Neurology on 1/26. She recommended treating the treatable, hypernatremia, AKI, COVID. Also due to cefepime toxicity leading to AMS, to stop antibiotic if no longer needed. Continue to monitor closely. -Still not at her baseline. -Dietitian consulted to initiate tube feeding, was started 1/27 - 1/31. Leave feeding tube out for now, SLP eval ongoing. Hopefully mentation continues to improve and can advance diet   Reorient as needed  AKI on CKD stage IV likely prerenal in the setting of dehydration and poor oral intake. -Baseline creatinine 2.6 -Hold off on further diuretic, nephrotoxins -Renal ultrasound obtained without hydronephrosis -Creatinine improving, continue IV fluid  COVID-19 -Tested positive on 1/20 -Remdesivir x 3 doses -Supportive treatments as ordered, encourage IS, encourage prone positioning, encourage mobilization as able. -Currently on room air with O2 saturation 99%.  COVID-19 Labs  Recent Labs (last 2 labs)   No results for input(s): DDIMER, FERRITIN, LDH, CRP in the last 72 hours.    Recent Labs       Lab Results  Component Value Date   [place] (A) 01/02/2021   [place] NEGATIVE 11/24/2020   [place] NEGATIVE 10/28/2020   [place] NEGATIVE 10/25/2020      Resolved on IV fluid: Hypernatremia She is currently on D5 half-normal saline.  Possible angioedema -Resolved, patient maintaining airway.  CT neck reassuring without significant swelling  HTN BP is not at goal. -Continue as needed hydralazine  IV Lopressor 2.5 mg 3 times daily due to not always taking her oral medications.  Chronic diastolic heart failure -EF 60-65%  -Hold off on further diuretics, continue Coreg Monitor strict I's and O's and daily weight.  History of stroke -Diagnosed October 2021, with residual left-sided weakness and expressive aphasia -Continue Plavix, Lipitor  Resolved fever, now hypothermic. -Blood cultures remain negative and remains afebrile, procal <0.1  -Now off cefepime -Been hypothermic, requiring Bair hugger    DVT prophylaxis:  SQ heparin 3 times daily Place and maintain sequential compression device Start: 01/02/21 2308  Code Status: Full code, confirmed with sister over the phone Family Communication:  None at bedside.   Status is: Inpatient    Dispo: The patient is from: Home.  Anticipated d/c  is to: Home.              Anticipated d/c date is: 01/17/2021.               Patient currently not stable for discharge due to altered mental status.   Difficult to place patient N/A        Objective: Vitals:   01/15/21 0545 01/15/21 1200 01/15/21 1439 01/15/21 1600  BP: (!) 177/77 (!) 202/80 (!) 147/58   Pulse: 77 81  76  Resp: 18   11  Temp: 98.2 F (36.8 C)   97.8 F (36.6 C)  TempSrc: Axillary   Oral  SpO2:    99%  Weight: 79.3 kg     Height:        Intake/Output Summary (Last 24 hours) at 01/15/2021 1728 Last data filed at 01/15/2021 9449 Gross per 24 hour  Intake 834.17 ml  Output 1050 ml  Net -215.83 ml   Filed Weights   01/10/21 0500 01/12/21 0419 01/15/21 0545  Weight: 74.5 kg 76.1 kg 79.3 kg    Exam:  . General: 75 y.o. year-old female well developed well nourished in no acute distress. Somnolent but easily arousable to voices. . Cardiovascular: Regular rate and rhythm with no rubs or gallops.  No thyromegaly or JVD noted.   Marland Kitchen Respiratory: Clear to auscultation with no wheezes or rales. Good inspiratory effort. . Abdomen: Soft nontender nondistended with normal bowel sounds x4 quadrants. . Musculoskeletal: Trace lower extremity edema bilaterally. . Skin: No ulcerative lesions noted or rashes . Psychiatry: Mood is appropriate for condition and setting   Data Reviewed: CBC: Recent Labs  Lab 01/09/21 0442 01/10/21 0428 01/15/21 0443  WBC 6.5 6.2 5.7  HGB 8.4* 9.8* 7.3*  HCT 27.8* 32.1* 24.7*  MCV 94.9 93.3 96.9  PLT 105* 91* 675*   Basic Metabolic Panel: Recent Labs  Lab 01/09/21 0442 01/10/21 0428 01/10/21 1633 01/11/21 0455 01/12/21 0439 01/13/21 0427 01/14/21 0422 01/15/21 0443  NA 142 141  --  145 144 141 145 146*  K 3.1* 4.1  --  3.8 3.9 4.1 4.2 4.4  CL 111 112*  --  116* 115* 114* 116* 118*  CO2 19* 20*  --  21* 21* 19* 20* 18*  GLUCOSE 138* 154*  --  241* 219* 247* 225* 131*  BUN 63* 60*  --  69* 70* 66* 74* 67*  CREATININE 4.17*  4.33*  --  3.87* 3.66* 3.40* 3.23* 3.06*  CALCIUM 8.2* 8.2*  --  8.0* 8.0* 7.9* 8.3* 8.6*  MG 2.1 2.1 2.2 2.3  --   --   --   --   PHOS 4.5 4.5 4.5 3.9  --   --   --   --    GFR: Estimated Creatinine Clearance: 17 mL/min (A) (by C-G formula based on SCr of 3.06 mg/dL (H)). Liver Function Tests: No results for input(s): AST, ALT, ALKPHOS, BILITOT, PROT, ALBUMIN in the last 168 hours. No results for input(s): LIPASE, AMYLASE in the last 168 hours. No results for input(s): AMMONIA in the last 168 hours. Coagulation Profile: No results for input(s): INR, PROTIME in the last 168 hours. Cardiac Enzymes: No results for input(s): CKTOTAL, CKMB, CKMBINDEX, TROPONINI in the last 168 hours. BNP (last 3 results) No results for input(s): PROBNP in the last 8760 hours. HbA1C: No results for input(s): HGBA1C in the last 72 hours. CBG: Recent Labs  Lab 01/15/21 0347 01/15/21 0421 01/15/21 0746 01/15/21 1114  01/15/21 1640  GLUCAP 119* 125* 122* 130* 138*   Lipid Profile: No results for input(s): CHOL, HDL, LDLCALC, TRIG, CHOLHDL, LDLDIRECT in the last 72 hours. Thyroid Function Tests: No results for input(s): TSH, T4TOTAL, FREET4, T3FREE, THYROIDAB in the last 72 hours. Anemia Panel: No results for input(s): VITAMINB12, FOLATE, FERRITIN, TIBC, IRON, RETICCTPCT in the last 72 hours. Urine analysis:    Component Value Date/Time   COLORURINE YELLOW 01/02/2021 2053   APPEARANCEUR CLEAR 01/02/2021 2053   LABSPEC 1.014 01/02/2021 2053   PHURINE 5.0 01/02/2021 2053   GLUCOSEU 50 (A) 01/02/2021 2053   HGBUR NEGATIVE 01/02/2021 2053   BILIRUBINUR NEGATIVE 01/02/2021 2053   KETONESUR NEGATIVE 01/02/2021 2053   PROTEINUR >=300 (A) 01/02/2021 2053   NITRITE NEGATIVE 01/02/2021 2053   LEUKOCYTESUR NEGATIVE 01/02/2021 2053   Sepsis Labs: @LABRCNTIP (procalcitonin:4,lacticidven:4)  )No results found for this or any previous visit (from the past 240 hour(s)).    Studies: No results  found.  Scheduled Meds: . atorvastatin  80 mg Per Tube Daily  . carvedilol  25 mg Per Tube BID WC  . chlorhexidine  15 mL Mouth Rinse BID  . Chlorhexidine Gluconate Cloth  6 each Topical Q0600  . clopidogrel  75 mg Per Tube Daily  . dorzolamide-timolol  1 drop Both Eyes BID  . ferrous sulfate  300 mg Per Tube QODAY  . free water  200 mL Per Tube Q6H  . hydrALAZINE  25 mg Per Tube Q8H  . insulin aspart  0-9 Units Subcutaneous Q4H  . insulin glargine  5 Units Subcutaneous QHS  . mouth rinse  15 mL Mouth Rinse q12n4p  . nystatin  5 mL Mouth/Throat QID  . pantoprazole sodium  40 mg Per Tube Daily    Continuous Infusions: . dextrose 5 % and 0.45% NaCl 75 mL/hr (01/15/21 0553)  . famotidine (PEPCID) IV 20 mg (01/14/21 1730)     LOS: 12 days     Kayleen Memos, MD [facility] Pager [phone]  If 7PM-7AM, please contact night-coverage [email or web address] Password [facility] 01/15/2021, 5:28 PM

## 2021-01-16 ENCOUNTER — Inpatient Hospital Stay (HOSPITAL_COMMUNITY): Payer: Medicare HMO

## 2021-01-16 DIAGNOSIS — E870 Hyperosmolality and hypernatremia: Secondary | ICD-10-CM | POA: Diagnosis not present

## 2021-01-16 LAB — GLUCOSE, CAPILLARY
Glucose-Capillary: 111 mg/dL — ABNORMAL HIGH (ref 70–99)
Glucose-Capillary: 121 mg/dL — ABNORMAL HIGH (ref 70–99)
Glucose-Capillary: 124 mg/dL — ABNORMAL HIGH (ref 70–99)
Glucose-Capillary: 191 mg/dL — ABNORMAL HIGH (ref 70–99)
Glucose-Capillary: 91 mg/dL (ref 70–99)
Glucose-Capillary: 92 mg/dL (ref 70–99)
Glucose-Capillary: 97 mg/dL (ref 70–99)

## 2021-01-16 LAB — CBC
HCT: 25.7 % — ABNORMAL LOW (ref 36.0–46.0)
Hemoglobin: 7.5 g/dL — ABNORMAL LOW (ref 12.0–15.0)
MCH: 28.4 pg (ref 26.0–34.0)
MCHC: 29.2 g/dL — ABNORMAL LOW (ref 30.0–36.0)
MCV: 97.3 fL (ref 80.0–100.0)
Platelets: 177 10*3/uL (ref 150–400)
RBC: 2.64 MIL/uL — ABNORMAL LOW (ref 3.87–5.11)
RDW: 14.9 % (ref 11.5–15.5)
WBC: 5.6 10*3/uL (ref 4.0–10.5)
nRBC: 0 % (ref 0.0–0.2)

## 2021-01-16 LAB — BASIC METABOLIC PANEL
Anion gap: 3 — ABNORMAL LOW (ref 5–15)
BUN: 58 mg/dL — ABNORMAL HIGH (ref 8–23)
CO2: 19 mmol/L — ABNORMAL LOW (ref 22–32)
Calcium: 8.5 mg/dL — ABNORMAL LOW (ref 8.9–10.3)
Chloride: 123 mmol/L — ABNORMAL HIGH (ref 98–111)
Creatinine, Ser: 3.07 mg/dL — ABNORMAL HIGH (ref 0.44–1.00)
GFR, Estimated: 15 mL/min — ABNORMAL LOW (ref >60)
Glucose, Bld: 105 mg/dL — ABNORMAL HIGH (ref 70–99)
Potassium: 4.2 mmol/L (ref 3.5–5.1)
Sodium: 145 mmol/L (ref 135–145)

## 2021-01-16 IMAGING — DX DG ABDOMEN 1V
1 series · 1 of 1 positions shown · non-contrast
Comparison: Radiograph [DATE], and prior

CLINICAL DATA: Feeding tube placement.

EXAM:
ABDOMEN - 1 VIEW

[abdomen kub]
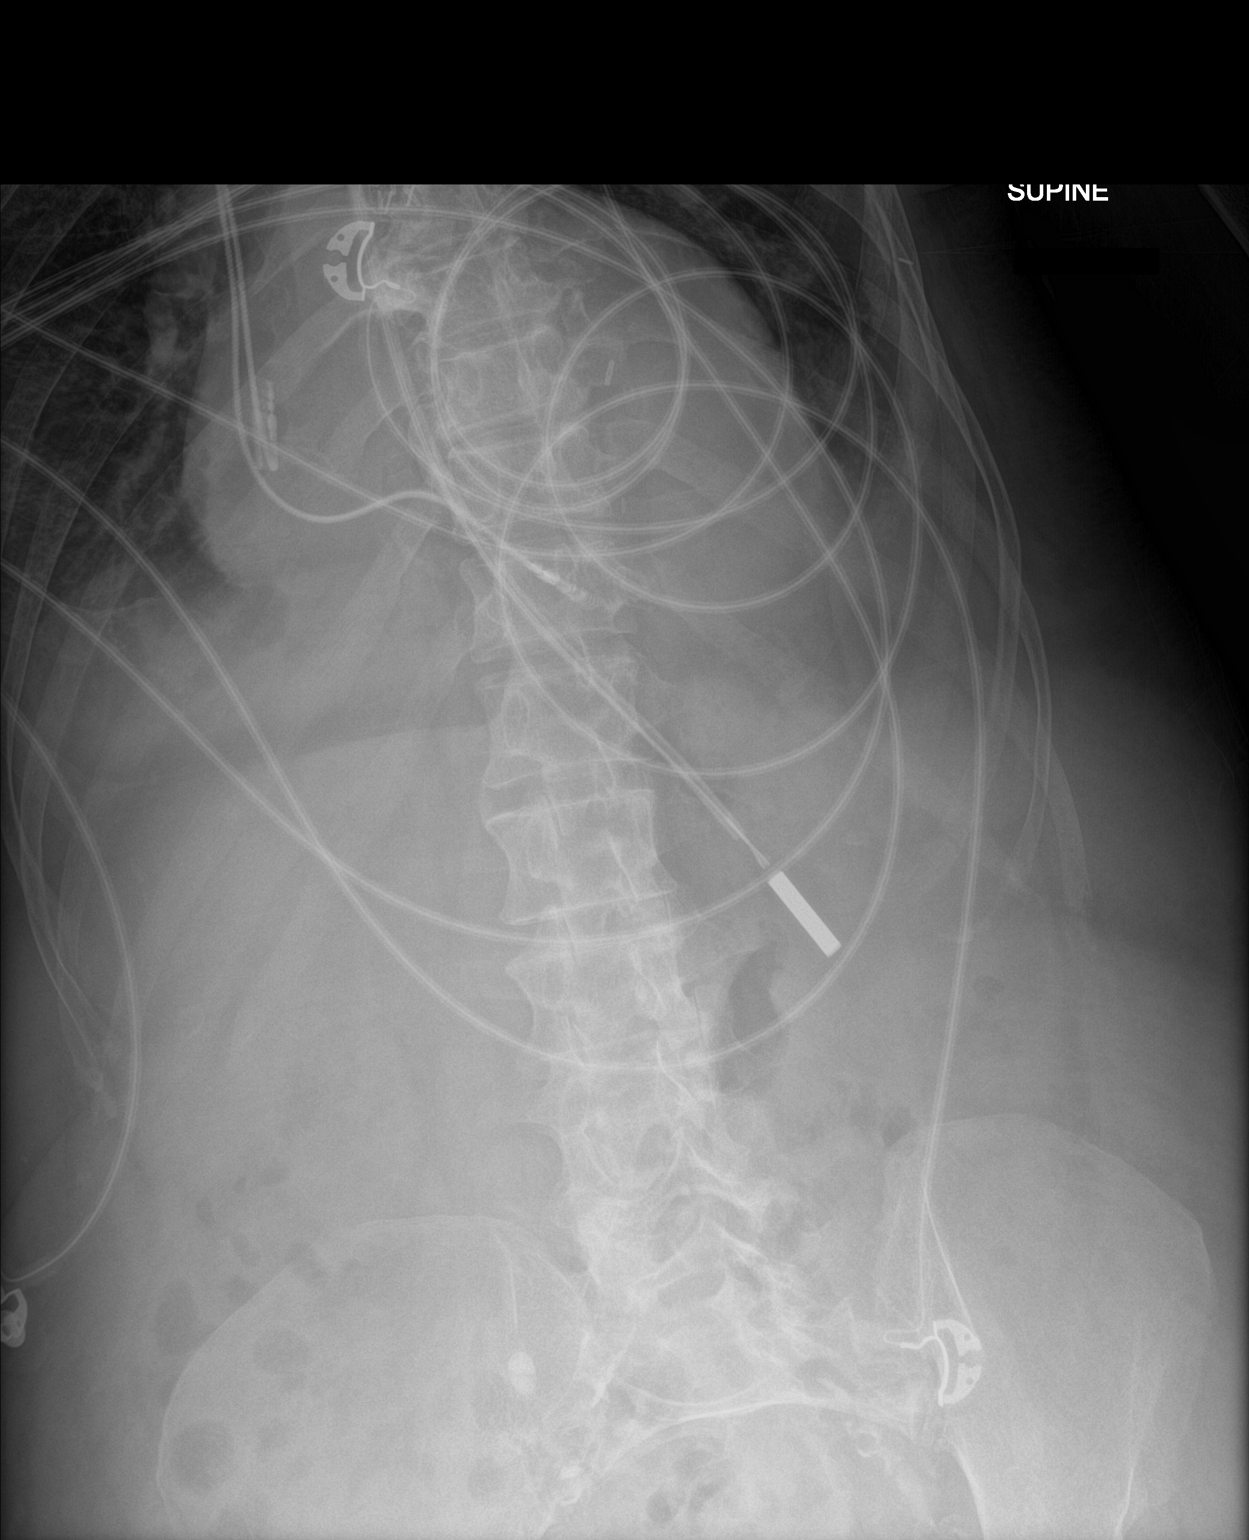

[1 of 1 positions shown; findings below may reference images not displayed]

FINDINGS: Tip of the weighted enteric tube is in the left upper quadrant in
the region of the mid stomach. Nonobstructive bowel gas pattern in
the upper abdomen. Ovoid calcification in the right abdomen
unchanged from prior imaging. Bilateral pleural effusions.
IMPRESSION: Tip of the weighted enteric tube in the mid stomach.

## 2021-01-16 MED ORDER — METOPROLOL TARTRATE 5 MG/5ML IV SOLN
5.0000 mg | Freq: Three times a day (TID) | INTRAVENOUS | Status: DC
  Administered 2021-01-16 (×3): 5 mg via INTRAVENOUS
  Filled 2021-01-16 (×3): qty 5

## 2021-01-16 MED ORDER — METOPROLOL TARTRATE 5 MG/5ML IV SOLN
2.5000 mg | Freq: Four times a day (QID) | INTRAVENOUS | Status: DC | PRN
  Administered 2021-02-16 – 2021-02-17 (×2): 2.5 mg via INTRAVENOUS
  Filled 2021-01-16 (×3): qty 5

## 2021-01-16 MED ORDER — OSMOLITE 1.5 CAL PO LIQD
1000.0000 mL | ORAL | Status: DC
  Administered 2021-01-16 – 2021-01-21 (×6): 1000 mL
  Filled 2021-01-16 (×9): qty 1000

## 2021-01-16 NOTE — Plan of Care (Signed)
  Problem: Education: ?Goal: Knowledge of General Education information will improve ?Description: Including pain rating scale, medication(s)/side effects and non-pharmacologic comfort measures ?Outcome: Progressing ?  ?Problem: Health Behavior/Discharge Planning: ?Goal: Ability to manage health-related needs will improve ?Outcome: Progressing ?  ?Problem: Clinical Measurements: ?Goal: Ability to maintain clinical measurements within normal limits will improve ?Outcome: Progressing ?Goal: Will remain free from infection ?Outcome: Progressing ?Goal: Diagnostic test results will improve ?Outcome: Progressing ?Goal: Respiratory complications will improve ?Outcome: Progressing ?Goal: Cardiovascular complication will be avoided ?Outcome: Progressing ?  ?Problem: Activity: ?Goal: Risk for activity intolerance will decrease ?Outcome: Progressing ?  ?Problem: Nutrition: ?Goal: Adequate nutrition will be maintained ?Outcome: Progressing ?  ?Problem: Coping: ?Goal: Level of anxiety will decrease ?Outcome: Progressing ?  ?Problem: Elimination: ?Goal: Will not experience complications related to bowel motility ?Outcome: Progressing ?Goal: Will not experience complications related to urinary retention ?Outcome: Progressing ?  ?Problem: Pain Managment: ?Goal: General experience of comfort will improve ?Outcome: Progressing ?  ?Problem: Safety: ?Goal: Ability to remain free from injury will improve ?Outcome: Progressing ?  ?Problem: Skin Integrity: ?Goal: Risk for impaired skin integrity will decrease ?Outcome: Progressing ?  ?Problem: Education: ?Goal: Knowledge of risk factors and measures for prevention of condition will improve ?Outcome: Progressing ?  ?

## 2021-01-16 NOTE — Progress Notes (Signed)
PROGRESS NOTE  Desiree Pollard NOB:[id number] DOB: Apr 16, 1946 DOA: 01/02/2021 PCP: [name], FNP  HPI/Recap of past 24 hours: Desiree Pollard is a 75 year old female with past medical history significant for previous stroke with residual left-sided weakness, expressive aphasia, dementia, CKD stage IV, diabetes who presents from her nursing facility due to altered mental status.  She was found to have hypernatremia as well as COVID-19.  She was started on Remdesivir.  There was initial concern for angioedema.  PCCM was consulted but did not feel that she required intubation.  MRI brain unremarkable for acute change. Hospitalization complicated by continued encephalopathy.  01/16/21:  More alert today.  Significant dysphagia per speech therapist, cortrack placed with aspiration precaution in place.    Assessment/Plan: Principal Problem:   Acute metabolic encephalopathy Active Problems:   Insulin dependent type 2 diabetes mellitus (HCC)   Hypertension   History of stroke   CKD (chronic kidney disease), stage IV ([place])   COVID-19 virus infection   Hypernatremia  Acute metabolic encephalopathy -At baseline, patient is conversant per sister. Able to walk with walker at baseline.  -Possibly secondary to hypernatremia, AKI, COVID-19 in setting of underlying dementia -CT head x2 without acute intracranial abnormality -EEG suggestive of moderate to severe diffuse encephalopathy, no epileptiform discharges, seizures seen -MRI brain without acute intracranial abnormality  -Discussed with Dr. Curly [place] Neurology on 1/26. She recommended treating the treatable, hypernatremia, AKI, COVID. Also due to cefepime toxicity leading to AMS, to stop antibiotic if no longer needed. Continue to monitor closely. -Still not at her baseline. -Dietitian consulted to initiate tube feeding, was started 1/27 - 1/31. Leave feeding tube out for now, SLP eval ongoing. Hopefully mentation continues to improve and can  advance diet  Reorient as needed  Severe dysphagia Appreciate speech therapist evaluation Cortrack placed with aspiration precaution in place.    Persistent AKI on CKD stage IV likely prerenal in the setting of dehydration and poor oral intake. -Baseline creatinine 2.6 Creatinine 3.07 from 3.06. -Hold off on further diuretic, nephrotoxins -Renal ultrasound obtained without hydronephrosis -Creatinine improving, continue IV fluid  COVID-19 -Tested positive on 1/20 -Remdesivir x 3 doses -Supportive treatments as ordered, encourage IS, encourage prone positioning, encourage mobilization as able. -Currently on room air with O2 saturation 99%.  COVID-19 Labs  Recent Labs (last 2 labs)   No results for input(s): DDIMER, FERRITIN, LDH, CRP in the last 72 hours.    Recent Labs       Lab Results  Component Value Date   Desiree Pollard (A) 01/02/2021   Desiree Pollard NEGATIVE 11/24/2020   Desiree Pollard NEGATIVE 10/28/2020   Desiree Pollard NEGATIVE 10/25/2020      Resolved on IV fluid: Hypernatremia She is currently on D5 half-normal saline.  Possible angioedema -Resolved, patient maintaining airway.  CT neck reassuring without significant swelling  HTN BP is not at goal. -Continue as needed hydralazine  IV Lopressor 2.5 mg 3 times daily due to not always taking her oral medications.  Chronic diastolic heart failure -EF 60-65%  -Hold off on further diuretics, continue Coreg Monitor strict I's and O's and daily weight.  History of stroke -Diagnosed October 2021, with residual left-sided weakness and expressive aphasia -Continue Plavix, Lipitor  Resolved fever, now hypothermic. -Blood cultures remain negative and remains afebrile, procal <0.1  -Now off cefepime -Been hypothermic, requiring Bair hugger    DVT prophylaxis:  SQ heparin 3 times daily Place and maintain sequential compression device Start: 01/02/21 2308  Code Status: Full code,  confirmed  with sister over the phone Family Communication:  None at bedside.   Status is: Inpatient    Dispo: The patient is from: Home.              Anticipated d/c is to: Home.              Anticipated d/c date is: 01/17/2021.               Patient currently not stable for discharge due to altered mental status.   Difficult to place patient N/A        Objective: Vitals:   01/16/21 0000 01/16/21 0500 01/16/21 1156 01/16/21 1355  BP: (!) 199/84 (!) 189/88 (!) 175/70 (!) 165/68  Pulse: 73 71  63  Resp:  18  16  Temp: 99.2 F (37.3 C) 99.4 F (37.4 C)    TempSrc: Oral Rectal    SpO2: 100% 100%  100%  Weight:  77.9 kg    Height:        Intake/Output Summary (Last 24 hours) at 01/16/2021 1857 Last data filed at 01/16/2021 1347 Gross per 24 hour  Intake 0 ml  Output 1400 ml  Net -1400 ml   Filed Weights   01/12/21 0419 01/15/21 0545 01/16/21 0500  Weight: 76.1 kg 79.3 kg 77.9 kg    Exam:  . General: 75 y.o. year-old female chronically ill-appearing no acute stress.  More alert today.  Minimally interactive.   . Cardiovascular: Regular rate and rhythm no rubs or gallops. Desiree Pollard Respiratory: Clear to auscultation no wheezes or rales. . Abdomen: Soft nontender bowel sounds present.   . Musculoskeletal: Trace lower extremity and upper extremity edema bilaterally. . Skin: No ulcerative lesions noted.   Desiree Pollard Psychiatry: Mood is appropriate for condition and setting.   Data Reviewed: CBC: Recent Labs  Lab 01/10/21 0428 01/15/21 0443 01/16/21 0759  WBC 6.2 5.7 5.6  HGB 9.8* 7.3* 7.5*  HCT 32.1* 24.7* 25.7*  MCV 93.3 96.9 97.3  PLT 91* 146* 595   Basic Metabolic Panel: Recent Labs  Lab 01/10/21 0428 01/10/21 1633 01/11/21 0455 01/12/21 0439 01/13/21 0427 01/14/21 0422 01/15/21 0443 01/16/21 0759  NA 141  --  145 144 141 145 146* 145  K 4.1  --  3.8 3.9 4.1 4.2 4.4 4.2  CL 112*  --  116* 115* 114* 116* 118* 123*  CO2 20*  --  21* 21* 19* 20* 18* 19*  GLUCOSE  154*  --  241* 219* 247* 225* 131* 105*  BUN 60*  --  69* 70* 66* 74* 67* 58*  CREATININE 4.33*  --  3.87* 3.66* 3.40* 3.23* 3.06* 3.07*  CALCIUM 8.2*  --  8.0* 8.0* 7.9* 8.3* 8.6* 8.5*  MG 2.1 2.2 2.3  --   --   --   --   --   PHOS 4.5 4.5 3.9  --   --   --   --   --    GFR: Estimated Creatinine Clearance: 16.8 mL/min (A) (by C-G formula based on SCr of 3.07 mg/dL (H)). Liver Function Tests: No results for input(s): AST, ALT, ALKPHOS, BILITOT, PROT, ALBUMIN in the last 168 hours. No results for input(s): LIPASE, AMYLASE in the last 168 hours. No results for input(s): AMMONIA in the last 168 hours. Coagulation Profile: No results for input(s): INR, PROTIME in the last 168 hours. Cardiac Enzymes: No results for input(s): CKTOTAL, CKMB, CKMBINDEX, TROPONINI in the last 168 hours. BNP (last 3 results) No results for input(s):  PROBNP in the last 8760 hours. HbA1C: No results for input(s): HGBA1C in the last 72 hours. CBG: Recent Labs  Lab 01/15/21 2333 01/16/21 0425 01/16/21 0803 01/16/21 1135 01/16/21 1700  GLUCAP 121* 91 92 97 111*   Lipid Profile: No results for input(s): CHOL, HDL, LDLCALC, TRIG, CHOLHDL, LDLDIRECT in the last 72 hours. Thyroid Function Tests: No results for input(s): TSH, T4TOTAL, FREET4, T3FREE, THYROIDAB in the last 72 hours. Anemia Panel: No results for input(s): VITAMINB12, FOLATE, FERRITIN, TIBC, IRON, RETICCTPCT in the last 72 hours. Urine analysis:    Component Value Date/Time   COLORURINE YELLOW 01/02/2021 2053   APPEARANCEUR CLEAR 01/02/2021 2053   LABSPEC 1.014 01/02/2021 2053   PHURINE 5.0 01/02/2021 2053   GLUCOSEU 50 (A) 01/02/2021 2053   HGBUR NEGATIVE 01/02/2021 2053   BILIRUBINUR NEGATIVE 01/02/2021 2053   KETONESUR NEGATIVE 01/02/2021 2053   PROTEINUR >=300 (A) 01/02/2021 2053   NITRITE NEGATIVE 01/02/2021 2053   LEUKOCYTESUR NEGATIVE 01/02/2021 2053   Sepsis Labs: @LABRCNTIP (procalcitonin:4,lacticidven:4)  )No results found  for this or any previous visit (from the past 240 hour(s)).    Studies: DG Abd 1 View  Result Date: 01/16/2021 CLINICAL DATA:  Feeding tube placement. EXAM: ABDOMEN - 1 VIEW COMPARISON:  Radiograph 01/07/2021, and prior FINDINGS: Tip of the weighted enteric tube is in the left upper quadrant in the region of the mid stomach. Nonobstructive bowel gas pattern in the upper abdomen. Ovoid calcification in the right abdomen unchanged from prior imaging. Bilateral pleural effusions. IMPRESSION: Tip of the weighted enteric tube in the mid stomach. Electronically Signed   By: Keith Rake M.D.   On: 01/16/2021 17:21    Scheduled Meds: . atorvastatin  80 mg Per Tube Daily  . chlorhexidine  15 mL Mouth Rinse BID  . Chlorhexidine Gluconate Cloth  6 each Topical Q0600  . clopidogrel  75 mg Per Tube Daily  . dorzolamide-timolol  1 drop Both Eyes BID  . ferrous sulfate  300 mg Per Tube QODAY  . free water  200 mL Per Tube Q6H  . heparin injection (subcutaneous)  5,000 Units Subcutaneous Q8H  . hydrALAZINE  25 mg Per Tube Q8H  . insulin aspart  0-9 Units Subcutaneous Q4H  . insulin glargine  5 Units Subcutaneous QHS  . mouth rinse  15 mL Mouth Rinse q12n4p  . metoprolol tartrate  5 mg Intravenous Q8H  . nystatin  5 mL Mouth/Throat QID  . pantoprazole sodium  40 mg Per Tube Daily    Continuous Infusions: . dextrose 5 % and 0.45% NaCl 75 mL/hr at 01/15/21 2044  . famotidine (PEPCID) IV 20 mg (01/16/21 1816)  . feeding supplement (OSMOLITE 1.5 CAL) 1,000 mL (01/16/21 1837)     LOS: 13 days     Kayleen Memos, MD [facility] Pager [phone]  If 7PM-7AM, please contact night-coverage [email or web address] Password [facility] 01/16/2021, 6:57 PM

## 2021-01-16 NOTE — Progress Notes (Signed)
  Speech Language Pathology Treatment: Dysphagia  Patient Details Name: Desiree Pollard MRN: [id number] DOB: Apr 26, 1946 Today's Date: 01/16/2021 Time: 3545-6256 SLP Time Calculation (min) (ACUTE ONLY): 25 min  Assessment / Plan / Recommendation Clinical Impression  Pt is not performing better than upon initial eval despite being alert and total cues. She is not managing secretions nor eliciting swallow despite total cues.  Her aphasia/motor planning deficit from her CVA prevent her from eliciting volitional swallow and this contributes to poor acute prognosis for functional swallow.  Open mouth posture - pooling of secretions observed - with SLP providing extensive oral care removing dried posterior secretions.  She does seal lips on suction catheter during approximately 75% of opportunities and with significant delay. MBS is not recommended due pt's lack of progress and pt does not have a functional swallow with poor prognosis for dysphagia at least in the acute setting.  No family present to educate and given pt is aspirating secretions, PEG will not prevent this from occuring.  Of note, pt also demonstrates"pill rolling" movement with her right hand without awareness.  Recommend excessive oral care for pt at least QID. SLP recommends consider SNF and follow with SLP at next venue of care for trial of dysphagia treatment.  Thanks.    HPI HPI: Pt is a 75 yo female adm to [facility] with acute metabolic encephalopathy - COVID +, has h/o CVAs x2 - last October 2021, DM, CKD4, dementia, breast cancer.  Imaging of brain previously has shown Stable appearance of right frontal encephalomalacia and bilateral  lacunar type infarcts in the basal ganglia and cerebellar  hemispheres. . Expected evolution of encephalomalacia along the right  inferior cerebellum corresponding with a presumed subacute infarct  on comparison CT.  3. Chronic microvascular angiopathy and parenchymal volume loss.  CVAs have left pt with aphasia and  left weakness per Admit note.  Most recent CXR concerning for potential infectious process.  Swallow eval ordered.      SLP Plan  Continue with current plan of care       Recommendations  Diet recommendations: NPO;Other(comment) (frequent oral care) Medication Administration: Via alternative means                Oral Care Recommendations: Oral care QID Follow up Recommendations: 24 hour supervision/assistance;Skilled Nursing facility SLP Visit Diagnosis: Dysphagia, oropharyngeal phase (R13.12) Plan: Continue with current plan of care       [place], Thelton Graca Ann 01/16/2021, 1:00 PM  Kathleen Lime, MS [place] Office [phone] Pager [phone]

## 2021-01-17 DIAGNOSIS — E870 Hyperosmolality and hypernatremia: Secondary | ICD-10-CM | POA: Diagnosis not present

## 2021-01-17 LAB — GLUCOSE, CAPILLARY
Glucose-Capillary: 150 mg/dL — ABNORMAL HIGH (ref 70–99)
Glucose-Capillary: 164 mg/dL — ABNORMAL HIGH (ref 70–99)
Glucose-Capillary: 167 mg/dL — ABNORMAL HIGH (ref 70–99)
Glucose-Capillary: 188 mg/dL — ABNORMAL HIGH (ref 70–99)
Glucose-Capillary: 222 mg/dL — ABNORMAL HIGH (ref 70–99)
Glucose-Capillary: 225 mg/dL — ABNORMAL HIGH (ref 70–99)

## 2021-01-17 NOTE — Progress Notes (Signed)
PROGRESS NOTE  Vermont Defina TMH:[id number] DOB: 01/13/1946 DOA: 01/02/2021 PCP: [name], FNP  HPI/Recap of past 24 hours: Desiree Pollard is a 75 year old female with past medical history significant for previous stroke with residual left-sided weakness, expressive aphasia, dementia, CKD stage IV, diabetes who presents from her nursing facility due to altered mental status.  She was found to have hypernatremia as well as COVID-19.  She was started on Remdesivir.  There was initial concern for angioedema.  PCCM was consulted but did not feel that she required intubation.  MRI brain unremarkable for acute change. Hospitalization complicated by continued encephalopathy.  01/17/21: Alert but minimally interactive.  Cortrach in place.   Assessment/Plan: Principal Problem:   Acute metabolic encephalopathy Active Problems:   Insulin dependent type 2 diabetes mellitus (HCC)   Hypertension   History of stroke   CKD (chronic kidney disease), stage IV ([place])   COVID-19 virus infection   Hypernatremia  Acute metabolic encephalopathy -At baseline, patient is conversant per sister. Able to walk with walker at baseline.  -Possibly secondary to hypernatremia, AKI, COVID-19 in setting of underlying dementia -CT head x2 without acute intracranial abnormality -EEG suggestive of moderate to severe diffuse encephalopathy, no epileptiform discharges, seizures seen -MRI brain without acute intracranial abnormality  -Discussed with Dr. Curly [place] Neurology on 1/26. She recommended treating the treatable, hypernatremia, AKI, COVID. Also due to cefepime toxicity leading to AMS, to stop antibiotic if no longer needed. Continue to monitor closely. -Still not at her baseline. -Dietitian consulted to initiate tube feeding, was started 1/27 - 1/31. Leave feeding tube out for now, SLP eval ongoing. Hopefully mentation continues to improve and can advance diet  Reorient as needed  Severe dysphagia Appreciate  speech therapist evaluation Cortrack placed with aspiration precaution in place.    Persistent AKI on CKD stage IV likely prerenal in the setting of dehydration and poor oral intake. -Baseline creatinine 2.6 Creatinine 3.07 from 3.06. -Hold off on further diuretic, nephrotoxins -Renal ultrasound obtained without hydronephrosis -Creatinine improving, continue IV fluid  COVID-19 -Tested positive on 1/20 -Remdesivir x 3 doses -Supportive treatments as ordered, encourage IS, encourage prone positioning, encourage mobilization as able. -Currently on room air with O2 saturation 99%.  COVID-19 Labs  Recent Labs (last 2 labs)   No results for input(s): DDIMER, FERRITIN, LDH, CRP in the last 72 hours.    Recent Labs       Lab Results  Component Value Date   [place] (A) 01/02/2021   [place] NEGATIVE 11/24/2020   [place] NEGATIVE 10/28/2020   [place] NEGATIVE 10/25/2020      Resolved on IV fluid: Hypernatremia She is currently on D5 half-normal saline.  Possible angioedema -Resolved, patient maintaining airway.  CT neck reassuring without significant swelling  HTN BP is not at goal. -Continue as needed hydralazine  IV Lopressor 2.5 mg 3 times daily due to not always taking her oral medications.  Chronic diastolic heart failure -EF 60-65%  -Hold off on further diuretics, continue Coreg Monitor strict I's and O's and daily weight.  History of stroke -Diagnosed October 2021, with residual left-sided weakness and expressive aphasia -Continue Plavix, Lipitor  Resolved fever, now hypothermic. -Blood cultures remain negative and remains afebrile, procal <0.1  -Now off cefepime -Been hypothermic, requiring Bair hugger    DVT prophylaxis:  SQ heparin 3 times daily Place and maintain sequential compression device Start: 01/02/21 2308  Code Status: Full code, confirmed with sister over the phone Family Communication:  None at  bedside.   Status is: Inpatient    Dispo: The patient is from: Home.              Anticipated d/c is to: Home.              Anticipated d/c date is: 01/18/2021.               Patient currently not stable for discharge due to altered mental status.   Difficult to place patient N/A        Objective: Vitals:   01/17/21 0000 01/17/21 0400 01/17/21 0530 01/17/21 1123  BP: (!) 143/64 (!) 144/68    Pulse: 66 70    Resp: 16 (!) 23    Temp: 97.9 F (36.6 C)  (!) 97.2 F (36.2 C) (!) 96.7 F (35.9 C)  TempSrc: Rectal  Rectal Axillary  SpO2: 99% 100%    Weight:   79.3 kg   Height:        Intake/Output Summary (Last 24 hours) at 01/17/2021 1743 Last data filed at 01/17/2021 1122 Gross per 24 hour  Intake --  Output 750 ml  Net -750 ml   Filed Weights   01/15/21 0545 01/16/21 0500 01/17/21 0530  Weight: 79.3 kg 77.9 kg 79.3 kg    Exam: No significant changes from prior exam.  . General: 75 y.o. year-old female chronically ill-appearing no acute stress.  More alert today.  Minimally interactive.   . Cardiovascular: Regular rate and rhythm no rubs or gallops. Marland Kitchen Respiratory: Clear to auscultation no wheezes or rales. . Abdomen: Soft nontender bowel sounds present.   . Musculoskeletal: Trace lower extremity and upper extremity edema bilaterally. . Skin: No ulcerative lesions noted.   Marland Kitchen Psychiatry: Mood is appropriate for condition and setting.   Data Reviewed: CBC: Recent Labs  Lab 01/15/21 0443 01/16/21 0759  WBC 5.7 5.6  HGB 7.3* 7.5*  HCT 24.7* 25.7*  MCV 96.9 97.3  PLT 146* 485   Basic Metabolic Panel: Recent Labs  Lab 01/11/21 0455 01/12/21 0439 01/13/21 0427 01/14/21 0422 01/15/21 0443 01/16/21 0759  NA 145 144 141 145 146* 145  K 3.8 3.9 4.1 4.2 4.4 4.2  CL 116* 115* 114* 116* 118* 123*  CO2 21* 21* 19* 20* 18* 19*  GLUCOSE 241* 219* 247* 225* 131* 105*  BUN 69* 70* 66* 74* 67* 58*  CREATININE 3.87* 3.66* 3.40* 3.23* 3.06* 3.07*  CALCIUM 8.0* 8.0*  7.9* 8.3* 8.6* 8.5*  MG 2.3  --   --   --   --   --   PHOS 3.9  --   --   --   --   --    GFR: Estimated Creatinine Clearance: 16.9 mL/min (A) (by C-G formula based on SCr of 3.07 mg/dL (H)). Liver Function Tests: No results for input(s): AST, ALT, ALKPHOS, BILITOT, PROT, ALBUMIN in the last 168 hours. No results for input(s): LIPASE, AMYLASE in the last 168 hours. No results for input(s): AMMONIA in the last 168 hours. Coagulation Profile: No results for input(s): INR, PROTIME in the last 168 hours. Cardiac Enzymes: No results for input(s): CKTOTAL, CKMB, CKMBINDEX, TROPONINI in the last 168 hours. BNP (last 3 results) No results for input(s): PROBNP in the last 8760 hours. HbA1C: No results for input(s): HGBA1C in the last 72 hours. CBG: Recent Labs  Lab 01/16/21 2354 01/17/21 0424 01/17/21 0723 01/17/21 1119 01/17/21 1701  GLUCAP 191* 222* 225* 150* 188*   Lipid Profile: No results for input(s): CHOL,  HDL, LDLCALC, TRIG, CHOLHDL, LDLDIRECT in the last 72 hours. Thyroid Function Tests: No results for input(s): TSH, T4TOTAL, FREET4, T3FREE, THYROIDAB in the last 72 hours. Anemia Panel: No results for input(s): VITAMINB12, FOLATE, FERRITIN, TIBC, IRON, RETICCTPCT in the last 72 hours. Urine analysis:    Component Value Date/Time   COLORURINE YELLOW 01/02/2021 2053   APPEARANCEUR CLEAR 01/02/2021 2053   LABSPEC 1.014 01/02/2021 2053   PHURINE 5.0 01/02/2021 2053   GLUCOSEU 50 (A) 01/02/2021 2053   HGBUR NEGATIVE 01/02/2021 2053   BILIRUBINUR NEGATIVE 01/02/2021 2053   KETONESUR NEGATIVE 01/02/2021 2053   PROTEINUR >=300 (A) 01/02/2021 2053   NITRITE NEGATIVE 01/02/2021 2053   LEUKOCYTESUR NEGATIVE 01/02/2021 2053   Sepsis Labs: @LABRCNTIP (procalcitonin:4,lacticidven:4)  )No results found for this or any previous visit (from the past 240 hour(s)).    Studies: No results found.  Scheduled Meds: . atorvastatin  80 mg Per Tube Daily  . chlorhexidine  15 mL Mouth  Rinse BID  . Chlorhexidine Gluconate Cloth  6 each Topical Q0600  . clopidogrel  75 mg Per Tube Daily  . dorzolamide-timolol  1 drop Both Eyes BID  . ferrous sulfate  300 mg Per Tube QODAY  . free water  200 mL Per Tube Q6H  . heparin injection (subcutaneous)  5,000 Units Subcutaneous Q8H  . hydrALAZINE  25 mg Per Tube Q8H  . insulin aspart  0-9 Units Subcutaneous Q4H  . insulin glargine  5 Units Subcutaneous QHS  . mouth rinse  15 mL Mouth Rinse q12n4p  . nystatin  5 mL Mouth/Throat QID  . pantoprazole sodium  40 mg Per Tube Daily    Continuous Infusions: . famotidine (PEPCID) IV 20 mg (01/17/21 1549)  . feeding supplement (OSMOLITE 1.5 CAL) 1,000 mL (01/16/21 1837)     LOS: 14 days     Kayleen Memos, MD [facility] Pager [phone]  If 7PM-7AM, please contact night-coverage [email or web address] Password [facility] 01/17/2021, 5:43 PM

## 2021-01-17 NOTE — Plan of Care (Signed)

## 2021-01-18 DIAGNOSIS — E870 Hyperosmolality and hypernatremia: Secondary | ICD-10-CM | POA: Diagnosis not present

## 2021-01-18 LAB — CBC WITH DIFFERENTIAL/PLATELET
Abs Immature Granulocytes: 0.12 10*3/uL — ABNORMAL HIGH (ref 0.00–0.07)
Basophils Absolute: 0 10*3/uL (ref 0.0–0.1)
Basophils Relative: 1 %
Eosinophils Absolute: 0.1 10*3/uL (ref 0.0–0.5)
Eosinophils Relative: 1 %
HCT: 24.9 % — ABNORMAL LOW (ref 36.0–46.0)
Hemoglobin: 7.2 g/dL — ABNORMAL LOW (ref 12.0–15.0)
Immature Granulocytes: 2 %
Lymphocytes Relative: 14 %
Lymphs Abs: 0.9 10*3/uL (ref 0.7–4.0)
MCH: 27.6 pg (ref 26.0–34.0)
MCHC: 28.9 g/dL — ABNORMAL LOW (ref 30.0–36.0)
MCV: 95.4 fL (ref 80.0–100.0)
Monocytes Absolute: 0.4 10*3/uL (ref 0.1–1.0)
Monocytes Relative: 6 %
Neutro Abs: 4.6 10*3/uL (ref 1.7–7.7)
Neutrophils Relative %: 76 %
Platelets: 218 10*3/uL (ref 150–400)
RBC: 2.61 MIL/uL — ABNORMAL LOW (ref 3.87–5.11)
RDW: 14.8 % (ref 11.5–15.5)
WBC: 6.1 10*3/uL (ref 4.0–10.5)
nRBC: 0 % (ref 0.0–0.2)

## 2021-01-18 LAB — COMPREHENSIVE METABOLIC PANEL
ALT: 85 U/L — ABNORMAL HIGH (ref 0–44)
AST: 38 U/L (ref 15–41)
Albumin: 2.2 g/dL — ABNORMAL LOW (ref 3.5–5.0)
Alkaline Phosphatase: 105 U/L (ref 38–126)
Anion gap: 5 (ref 5–15)
BUN: 59 mg/dL — ABNORMAL HIGH (ref 8–23)
CO2: 20 mmol/L — ABNORMAL LOW (ref 22–32)
Calcium: 8.2 mg/dL — ABNORMAL LOW (ref 8.9–10.3)
Chloride: 123 mmol/L — ABNORMAL HIGH (ref 98–111)
Creatinine, Ser: 2.98 mg/dL — ABNORMAL HIGH (ref 0.44–1.00)
GFR, Estimated: 16 mL/min — ABNORMAL LOW (ref >60)
Glucose, Bld: 203 mg/dL — ABNORMAL HIGH (ref 70–99)
Potassium: 4 mmol/L (ref 3.5–5.1)
Sodium: 148 mmol/L — ABNORMAL HIGH (ref 135–145)
Total Bilirubin: 0.2 mg/dL — ABNORMAL LOW (ref 0.3–1.2)
Total Protein: 5.6 g/dL — ABNORMAL LOW (ref 6.5–8.1)

## 2021-01-18 LAB — GLUCOSE, CAPILLARY
Glucose-Capillary: 186 mg/dL — ABNORMAL HIGH (ref 70–99)
Glucose-Capillary: 175 mg/dL — ABNORMAL HIGH (ref 70–99)
Glucose-Capillary: 194 mg/dL — ABNORMAL HIGH (ref 70–99)
Glucose-Capillary: 219 mg/dL — ABNORMAL HIGH (ref 70–99)
Glucose-Capillary: 219 mg/dL — ABNORMAL HIGH (ref 70–99)

## 2021-01-18 LAB — MAGNESIUM: Magnesium: 2.5 mg/dL — ABNORMAL HIGH (ref 1.7–2.4)

## 2021-01-18 LAB — PHOSPHORUS: Phosphorus: 3.7 mg/dL (ref 2.5–4.6)

## 2021-01-18 MED ORDER — CARVEDILOL 6.25 MG PO TABS
6.2500 mg | ORAL_TABLET | Freq: Two times a day (BID) | ORAL | Status: DC
  Administered 2021-01-18 – 2021-02-19 (×61): 6.25 mg via NASOGASTRIC
  Filled 2021-01-18 (×61): qty 1

## 2021-01-18 MED ORDER — CARVEDILOL 3.125 MG PO TABS
3.1250 mg | ORAL_TABLET | Freq: Two times a day (BID) | ORAL | Status: DC
  Administered 2021-01-18: 3.125 mg via NASOGASTRIC
  Filled 2021-01-18: qty 1

## 2021-01-18 NOTE — Progress Notes (Signed)
PROGRESS NOTE  Desiree Pollard PPI:[id number] DOB: 1946-08-27 DOA: 01/02/2021 PCP: Desiree Glatter, FNP  HPI/Recap of past 24 hours: Desiree Pollard is a 75 year old female with past medical history significant for previous stroke with residual left-sided weakness, expressive aphasia, dementia, CKD stage IV, diabetes who presents from her nursing facility due to altered mental status.  She was found to have hypernatremia as well as COVID-19.  She was started on Remdesivir.  There was initial concern for angioedema.  PCCM was consulted but did not feel that she required intubation.  MRI brain unremarkable for acute change. Hospitalization complicated by continued encephalopathy.  01/18/21: Somnolent but arousable to sternal rubs.  Minimally interactive.   Assessment/Plan: Principal Problem:   Acute metabolic encephalopathy Active Problems:   Insulin dependent type 2 diabetes mellitus (HCC)   Hypertension   History of stroke   CKD (chronic kidney disease), stage IV ([place])   COVID-19 virus infection   Hypernatremia  Persistent acute metabolic encephalopathy -At baseline, patient is conversant per sister. Able to walk with walker at baseline.  -Likely multifactorial secondary to hypernatremia, AKI, COVID-19 in setting of underlying dementia -CT head x2 without acute intracranial abnormality -EEG suggestive of moderate to severe diffuse encephalopathy, no epileptiform discharges, seizures seen -MRI brain without acute intracranial abnormality  -Dr. Curly Pollard Neurology on 1/26, recommended treating the treatable, hypernatremia, AKI, COVID. Also due to cefepime toxicity leading to AMS, to stop antibiotic if no longer needed.  -Still not at her baseline. -Dietitian consulted to initiate tube feeding, was started 1/27 - 1/31.   Severe dysphagia postop cortrak placement. Appreciate speech therapist evaluation Cortrack placed with aspiration precaution in place.    Improving AKI on CKD stage IV  likely prerenal in the setting of dehydration and poor oral intake. -Baseline creatinine 2.6 Creatinine downtrending 2.98 from 3.07. -Hold off on further diuretic, nephrotoxins -Renal ultrasound obtained without hydronephrosis  COVID-19 -Tested positive on 1/20 -Remdesivir x 3 doses -Supportive treatments as ordered, encourage IS, encourage prone positioning, encourage mobilization as able. -Currently on room air with O2 saturation 99%.  COVID-19 Labs  Recent Labs (last 2 labs)   No results for input(s): DDIMER, FERRITIN, LDH, CRP in the last 72 hours.    Recent Labs       Lab Results  Component Value Date   [place] (A) 01/02/2021   [place] NEGATIVE 11/24/2020   [place] NEGATIVE 10/28/2020   [place] NEGATIVE 10/25/2020      Resolved on IV fluid: Hypernatremia Free water flushes through tube feeding  Possible angioedema -Resolved, patient maintaining airway.  CT neck reassuring without significant swelling  HTN, uncontrolled. BP is not at goal, elevated. Increase dose of Coreg via tube feeding -Continue as needed hydralazine   Chronic diastolic heart failure -EF 60-65%  -Hold off on further diuretics, continue Coreg Monitor strict I's and O's and daily weight. Net I&O +1.0 L  History of stroke -Diagnosed October 2021, with residual left-sided weakness and expressive aphasia -Continue Plavix, Lipitor  ? Fever Recently hypothermic DC Bair hugger.   DVT prophylaxis:  SQ heparin 3 times daily Place and maintain sequential compression device Start: 01/02/21 2308  Code Status: Full code, confirmed with sister over the phone Family Communication:  None at bedside.   Status is: Inpatient    Dispo: The patient is from: Home.              Anticipated d/c is to: SNF.  Anticipated d/c date is: 01/20/2021.               Patient currently not stable for discharge due to persistent altered mental status.    Difficult to place patient N/A        Objective: Vitals:   01/18/21 0800 01/18/21 1240 01/18/21 1300 01/18/21 1500  BP:  (!) 170/58 (!) 159/61 (!) 158/67  Pulse:  74 76 80  Resp:  (!) 28 (!) 22 17  Temp:  100.2 F (37.9 C) 98.8 F (37.1 C) 98.6 F (37 C)  TempSrc:  Axillary Axillary Axillary  SpO2: 98% 98%  99%  Weight:      Height:        Intake/Output Summary (Last 24 hours) at 01/18/2021 1625 Last data filed at 01/18/2021 1305 Gross per 24 hour  Intake --  Output 725 ml  Net -725 ml   Filed Weights   01/16/21 0500 01/17/21 0530 01/18/21 0425  Weight: 77.9 kg 79.3 kg 79.1 kg    Exam:   . General: 75 y.o. year-old female chronically ill-appearing in no acute distress.  Somnolent but arousable to sternal rub.   . Cardiovascular: Regular rate and rhythm no rubs or gallops. Marland Kitchen Respiratory: Clear to auscultation no wheeze no rales. . Abdomen: Soft Nontender Bowel Sounds Present.   . Musculoskeletal: Trace upper and lower extremity edema bilaterally.   Marland Kitchen Psychiatry: Unable to assess mood due to somnolence.   Data Reviewed: CBC: Recent Labs  Lab 01/15/21 0443 01/16/21 0759 01/18/21 0655  WBC 5.7 5.6 6.1  NEUTROABS  --   --  4.6  HGB 7.3* 7.5* 7.2*  HCT 24.7* 25.7* 24.9*  MCV 96.9 97.3 95.4  PLT 146* 177 616   Basic Metabolic Panel: Recent Labs  Lab 01/13/21 0427 01/14/21 0422 01/15/21 0443 01/16/21 0759 01/18/21 0655  NA 141 145 146* 145 148*  K 4.1 4.2 4.4 4.2 4.0  CL 114* 116* 118* 123* 123*  CO2 19* 20* 18* 19* 20*  GLUCOSE 247* 225* 131* 105* 203*  BUN 66* 74* 67* 58* 59*  CREATININE 3.40* 3.23* 3.06* 3.07* 2.98*  CALCIUM 7.9* 8.3* 8.6* 8.5* 8.2*  MG  --   --   --   --  2.5*  PHOS  --   --   --   --  3.7   GFR: Estimated Creatinine Clearance: 17.4 mL/min (A) (by C-G formula based on SCr of 2.98 mg/dL (H)). Liver Function Tests: Recent Labs  Lab 01/18/21 0655  AST 38  ALT 85*  ALKPHOS 105  BILITOT 0.2*  PROT 5.6*  ALBUMIN 2.2*   No  results for input(s): LIPASE, AMYLASE in the last 168 hours. No results for input(s): AMMONIA in the last 168 hours. Coagulation Profile: No results for input(s): INR, PROTIME in the last 168 hours. Cardiac Enzymes: No results for input(s): CKTOTAL, CKMB, CKMBINDEX, TROPONINI in the last 168 hours. BNP (last 3 results) No results for input(s): PROBNP in the last 8760 hours. HbA1C: No results for input(s): HGBA1C in the last 72 hours. CBG: Recent Labs  Lab 01/17/21 1948 01/17/21 2357 01/18/21 0426 01/18/21 0807 01/18/21 1141  GLUCAP 164* 167* 186* 175* 194*   Lipid Profile: No results for input(s): CHOL, HDL, LDLCALC, TRIG, CHOLHDL, LDLDIRECT in the last 72 hours. Thyroid Function Tests: No results for input(s): TSH, T4TOTAL, FREET4, T3FREE, THYROIDAB in the last 72 hours. Anemia Panel: No results for input(s): VITAMINB12, FOLATE, FERRITIN, TIBC, IRON, RETICCTPCT in the last 72 hours. Urine  analysis:    Component Value Date/Time   COLORURINE YELLOW 01/02/2021 2053   APPEARANCEUR CLEAR 01/02/2021 2053   LABSPEC 1.014 01/02/2021 2053   PHURINE 5.0 01/02/2021 2053   GLUCOSEU 50 (A) 01/02/2021 2053   HGBUR NEGATIVE 01/02/2021 2053   BILIRUBINUR NEGATIVE 01/02/2021 2053   KETONESUR NEGATIVE 01/02/2021 2053   PROTEINUR >=300 (A) 01/02/2021 2053   NITRITE NEGATIVE 01/02/2021 2053   LEUKOCYTESUR NEGATIVE 01/02/2021 2053   Sepsis Labs: @LABRCNTIP (procalcitonin:4,lacticidven:4)  )No results found for this or any previous visit (from the past 240 hour(s)).    Studies: No results found.  Scheduled Meds: . atorvastatin  80 mg Per Tube Daily  . carvedilol  3.125 mg Per NG tube BID WC  . chlorhexidine  15 mL Mouth Rinse BID  . Chlorhexidine Gluconate Cloth  6 each Topical Q0600  . clopidogrel  75 mg Per Tube Daily  . dorzolamide-timolol  1 drop Both Eyes BID  . ferrous sulfate  300 mg Per Tube QODAY  . free water  200 mL Per Tube Q6H  . heparin injection (subcutaneous)   5,000 Units Subcutaneous Q8H  . hydrALAZINE  25 mg Per Tube Q8H  . insulin aspart  0-9 Units Subcutaneous Q4H  . insulin glargine  5 Units Subcutaneous QHS  . mouth rinse  15 mL Mouth Rinse q12n4p  . nystatin  5 mL Mouth/Throat QID  . pantoprazole sodium  40 mg Per Tube Daily    Continuous Infusions: . famotidine (PEPCID) IV 20 mg (01/17/21 1549)  . feeding supplement (OSMOLITE 1.5 CAL) 1,000 mL (01/18/21 1316)     LOS: 15 days     Kayleen Memos, MD [facility] Pager [phone]  If 7PM-7AM, please contact night-coverage [email or web address] Password [facility] 01/18/2021, 4:25 PM

## 2021-01-18 NOTE — Progress Notes (Signed)
Notified Dr Nevada Crane pt has a yellow mews, respirations irregular temp was 100.3 but pt had a large amt of blankets on and room temp was very warm. Adjusted temp in room and removed blanket. Pt also bathed. Temp is better 98.8. no new orders at this time.

## 2021-01-19 DIAGNOSIS — E870 Hyperosmolality and hypernatremia: Secondary | ICD-10-CM | POA: Diagnosis not present

## 2021-01-19 LAB — BASIC METABOLIC PANEL
Anion gap: 7 (ref 5–15)
BUN: 61 mg/dL — ABNORMAL HIGH (ref 8–23)
CO2: 20 mmol/L — ABNORMAL LOW (ref 22–32)
Calcium: 8 mg/dL — ABNORMAL LOW (ref 8.9–10.3)
Chloride: 120 mmol/L — ABNORMAL HIGH (ref 98–111)
Creatinine, Ser: 2.98 mg/dL — ABNORMAL HIGH (ref 0.44–1.00)
GFR, Estimated: 16 mL/min — ABNORMAL LOW (ref >60)
Glucose, Bld: 270 mg/dL — ABNORMAL HIGH (ref 70–99)
Potassium: 4.7 mmol/L (ref 3.5–5.1)
Sodium: 147 mmol/L — ABNORMAL HIGH (ref 135–145)

## 2021-01-19 LAB — ABO/RH: ABO/RH(D): O POS

## 2021-01-19 LAB — CBC
HCT: 23.7 % — ABNORMAL LOW (ref 36.0–46.0)
Hemoglobin: 7 g/dL — ABNORMAL LOW (ref 12.0–15.0)
MCH: 28.8 pg (ref 26.0–34.0)
MCHC: 29.5 g/dL — ABNORMAL LOW (ref 30.0–36.0)
MCV: 97.5 fL (ref 80.0–100.0)
Platelets: 194 10*3/uL (ref 150–400)
RBC: 2.43 MIL/uL — ABNORMAL LOW (ref 3.87–5.11)
RDW: 14.9 % (ref 11.5–15.5)
WBC: 4.7 10*3/uL (ref 4.0–10.5)
nRBC: 0 % (ref 0.0–0.2)

## 2021-01-19 LAB — GLUCOSE, CAPILLARY
Glucose-Capillary: 201 mg/dL — ABNORMAL HIGH (ref 70–99)
Glucose-Capillary: 214 mg/dL — ABNORMAL HIGH (ref 70–99)
Glucose-Capillary: 227 mg/dL — ABNORMAL HIGH (ref 70–99)
Glucose-Capillary: 232 mg/dL — ABNORMAL HIGH (ref 70–99)
Glucose-Capillary: 253 mg/dL — ABNORMAL HIGH (ref 70–99)
Glucose-Capillary: 254 mg/dL — ABNORMAL HIGH (ref 70–99)

## 2021-01-19 LAB — PREPARE RBC (CROSSMATCH)

## 2021-01-19 MED ORDER — SODIUM CHLORIDE 0.9% IV SOLUTION: Freq: Once | INTRAVENOUS | Status: AC

## 2021-01-19 NOTE — Progress Notes (Signed)
PROGRESS NOTE  Desiree Pollard EXB:[id number] DOB: 21-Aug-1946 DOA: 01/02/2021 PCP: [name], FNP  HPI/Recap of past 24 hours: Desiree Pollard is a 75 year old female with past medical history significant for previous stroke with residual left-sided weakness, expressive aphasia, dementia, CKD stage IV, diabetes who presents from her nursing facility due to altered mental status.  She was found to have hypernatremia as well as COVID-19.  She was started on Remdesivir.  There was initial concern for angioedema.  PCCM was consulted but did not feel that she required intubation.  MRI brain unremarkable for acute change. Hospitalization complicated by continued encephalopathy.  01/19/21: Seen with family in the room.  Husband is upset and aggressive towards the writer because he was not allowed to visit the patient on 01/18/21.  Explained that there are visitation rules that are beyond my control.  Tells the writer that she lied to him about the patient being minimally interactive. "you told me she couldn't open her eyes but she opens her eyes!"    Patient is still minimally interactive.  She is somnolent, arousable to voices and drifts back to sleep.   Assessment/Plan: Principal Problem:   Acute metabolic encephalopathy Active Problems:   Insulin dependent type 2 diabetes mellitus (HCC)   Hypertension   History of stroke   CKD (chronic kidney disease), stage IV ([place])   COVID-19 virus infection   Hypernatremia  Persistent acute metabolic encephalopathy -At baseline, patient is conversant per sister.  Able to walk with walker at baseline.  -Likely multifactorial secondary to hypernatremia, AKI, COVID-19 viral infection in setting of underlying dementia -CT head x2 without acute intracranial abnormality -EEG suggestive of moderate to severe diffuse encephalopathy, no epileptiform discharges. -MRI brain without acute intracranial abnormality  -Dr. Curly [place] Neurology on 01/08/21, recommended  treating the treatable, hypernatremia, AKI, COVID-19 viral infection. -Still not at her baseline. -Cortrak in place for nutrition, failed swallow evaluation.  Acute on chronic anemia of chronic disease Baseline Hg appears to be 9.8 Hg drop to 7.0 this AM No overt bleeding Obtain iron studies Consent obtained from her sister to transfuse 1U PRBC on 01/19/21. Repeat H&H post transfusion.  Severe dysphagia post cortrak placement. Appreciate speech therapist evaluation Cortrack placed Aspiration precaution in place.    Improving AKI on CKD stage IV likely prerenal in the setting of dehydration and poor oral intake. -Baseline creatinine 2.6 Creatinine downtrending 2.98 from 3.07. -Hold off on further diuretic, nephrotoxins -Renal ultrasound obtained without hydronephrosis  COVID-19 viral infection -Tested positive on 01/02/21 -Remdesivir x 3 doses -Supportive treatments as ordered, encourage IS, encourage prone positioning, encourage mobilization as able. -Currently on room air with O2 saturation 99%.  Resolved on IV fluid: Hypernatremia Free water flushes through tube feeding  Possible angioedema -Resolved, patient maintaining airway.  CT neck reassuring without significant swelling  HTN BP is improving after increasing dose of coreg to 6.25 mg BID -Continue as needed IV hydralazine with parameters Continue to closely monitor vital signs  Chronic diastolic CHF -EF 10-27%  -Hold off on further diuretics, continue Coreg Monitor strict I's and O's and daily weight. Net I&O +562CC  History of stroke -Diagnosed October 2021, with residual left-sided weakness and expressive aphasia -Continue Plavix, Lipitor -PT has recommended SNF  ? Fever Recently hypothermic DC Bair hugger.   DVT prophylaxis:  SQ heparin 3 times daily Place and maintain sequential compression device Start: 01/02/21 2308  Code Status: Full code, confirmed with sister over the phone Family  Communication:  Brother, husband  and sister Desiree Pollard at bedside.   Status is: Inpatient    Dispo: The patient is from: Home.              Anticipated d/c is to: SNF.              Anticipated d/c date is: 01/22/2021.               Patient currently not stable for discharge due to persistent altered mental status.   Difficult to place patient N/A        Objective: Vitals:   01/19/21 0000 01/19/21 0400 01/19/21 0441 01/19/21 0508  BP:    (!) 154/59  Pulse:      Resp: 16   (!) 21  Temp: 97.8 F (36.6 C) 98.3 F (36.8 C)  98.6 F (37 C)  TempSrc: Oral Oral  Oral  SpO2:      Weight:   80.5 kg   Height:        Intake/Output Summary (Last 24 hours) at 01/19/2021 1227 Last data filed at 01/19/2021 3557 Gross per 24 hour  Intake 1721 ml  Output 1225 ml  Net 496 ml   Filed Weights   01/17/21 0530 01/18/21 0425 01/19/21 0441  Weight: 79.3 kg 79.1 kg 80.5 kg    Exam:   . General: 75 y.o. year-old female chronically ill-appearing in no acute distress.  Somnolent but arousable to voices. . Cardiovascular: RRR no rubs or gallops . Respiratory: CTA no wheezes or rales . Abdomen: Obese NT ND NBS  . Musculoskeletal: Trace UE and LE bilaterally . Psychiatry: Unable to assess mood due to somnolence.   Data Reviewed: CBC: Recent Labs  Lab 01/15/21 0443 01/16/21 0759 01/18/21 0655 01/19/21 0820  WBC 5.7 5.6 6.1 4.7  NEUTROABS  --   --  4.6  --   HGB 7.3* 7.5* 7.2* 7.0*  HCT 24.7* 25.7* 24.9* 23.7*  MCV 96.9 97.3 95.4 97.5  PLT 146* 177 218 322   Basic Metabolic Panel: Recent Labs  Lab 01/14/21 0422 01/15/21 0443 01/16/21 0759 01/18/21 0655 01/19/21 0820  NA 145 146* 145 148* 147*  K 4.2 4.4 4.2 4.0 4.7  CL 116* 118* 123* 123* 120*  CO2 20* 18* 19* 20* 20*  GLUCOSE 225* 131* 105* 203* 270*  BUN 74* 67* 58* 59* 61*  CREATININE 3.23* 3.06* 3.07* 2.98* 2.98*  CALCIUM 8.3* 8.6* 8.5* 8.2* 8.0*  MG  --   --   --  2.5*  --   PHOS  --   --   --  3.7  --     GFR: Estimated Creatinine Clearance: 17.5 mL/min (A) (by C-G formula based on SCr of 2.98 mg/dL (H)). Liver Function Tests: Recent Labs  Lab 01/18/21 0655  AST 38  ALT 85*  ALKPHOS 105  BILITOT 0.2*  PROT 5.6*  ALBUMIN 2.2*   No results for input(s): LIPASE, AMYLASE in the last 168 hours. No results for input(s): AMMONIA in the last 168 hours. Coagulation Profile: No results for input(s): INR, PROTIME in the last 168 hours. Cardiac Enzymes: No results for input(s): CKTOTAL, CKMB, CKMBINDEX, TROPONINI in the last 168 hours. BNP (last 3 results) No results for input(s): PROBNP in the last 8760 hours. HbA1C: No results for input(s): HGBA1C in the last 72 hours. CBG: Recent Labs  Lab 01/18/21 1628 01/18/21 1943 01/19/21 0040 01/19/21 0436 01/19/21 0751  GLUCAP 219* 219* 227* 232* 214*   Lipid Profile: No results for input(s): CHOL, HDL,  LDLCALC, TRIG, CHOLHDL, LDLDIRECT in the last 72 hours. Thyroid Function Tests: No results for input(s): TSH, T4TOTAL, FREET4, T3FREE, THYROIDAB in the last 72 hours. Anemia Panel: No results for input(s): VITAMINB12, FOLATE, FERRITIN, TIBC, IRON, RETICCTPCT in the last 72 hours. Urine analysis:    Component Value Date/Time   COLORURINE YELLOW 01/02/2021 2053   APPEARANCEUR CLEAR 01/02/2021 2053   LABSPEC 1.014 01/02/2021 2053   PHURINE 5.0 01/02/2021 2053   GLUCOSEU 50 (A) 01/02/2021 2053   HGBUR NEGATIVE 01/02/2021 2053   BILIRUBINUR NEGATIVE 01/02/2021 2053   KETONESUR NEGATIVE 01/02/2021 2053   PROTEINUR >=300 (A) 01/02/2021 2053   NITRITE NEGATIVE 01/02/2021 2053   LEUKOCYTESUR NEGATIVE 01/02/2021 2053   Sepsis Labs: @LABRCNTIP (procalcitonin:4,lacticidven:4)  )No results found for this or any previous visit (from the past 240 hour(s)).    Studies: No results found.  Scheduled Meds: . sodium chloride   Intravenous Once  . atorvastatin  80 mg Per Tube Daily  . carvedilol  6.25 mg Per NG tube BID WC  . chlorhexidine   15 mL Mouth Rinse BID  . Chlorhexidine Gluconate Cloth  6 each Topical Q0600  . clopidogrel  75 mg Per Tube Daily  . dorzolamide-timolol  1 drop Both Eyes BID  . ferrous sulfate  300 mg Per Tube QODAY  . free water  200 mL Per Tube Q6H  . heparin injection (subcutaneous)  5,000 Units Subcutaneous Q8H  . hydrALAZINE  25 mg Per Tube Q8H  . insulin aspart  0-9 Units Subcutaneous Q4H  . insulin glargine  5 Units Subcutaneous QHS  . mouth rinse  15 mL Mouth Rinse q12n4p  . nystatin  5 mL Mouth/Throat QID  . pantoprazole sodium  40 mg Per Tube Daily    Continuous Infusions: . famotidine (PEPCID) IV 20 mg (01/18/21 1741)  . feeding supplement (OSMOLITE 1.5 CAL) 60 mL/hr at 01/19/21 0658     LOS: 16 days     Kayleen Memos, MD [facility] Pager [phone]  If 7PM-7AM, please contact night-coverage [email or web address] Password [facility] 01/19/2021, 12:27 PM

## 2021-01-19 NOTE — Plan of Care (Signed)
  Problem: Nutrition: Goal: Adequate nutrition will be maintained Outcome: Progressing   Problem: Skin Integrity: Goal: Risk for impaired skin integrity will decrease Outcome: Progressing   Problem: Education: Goal: Knowledge of risk factors and measures for prevention of condition will improve Outcome: Progressing

## 2021-01-20 DIAGNOSIS — E870 Hyperosmolality and hypernatremia: Secondary | ICD-10-CM | POA: Diagnosis not present

## 2021-01-20 LAB — HEMOGLOBIN AND HEMATOCRIT, BLOOD
HCT: 22.8 % — ABNORMAL LOW (ref 36.0–46.0)
HCT: 27 % — ABNORMAL LOW (ref 36.0–46.0)
Hemoglobin: 6.7 g/dL — CL (ref 12.0–15.0)
Hemoglobin: 8.1 g/dL — ABNORMAL LOW (ref 12.0–15.0)

## 2021-01-20 LAB — CBC WITH DIFFERENTIAL/PLATELET
Abs Immature Granulocytes: 0.12 10*3/uL — ABNORMAL HIGH (ref 0.00–0.07)
Basophils Absolute: 0.1 10*3/uL (ref 0.0–0.1)
Basophils Relative: 1 %
Eosinophils Absolute: 0.1 10*3/uL (ref 0.0–0.5)
Eosinophils Relative: 2 %
HCT: 24.9 % — ABNORMAL LOW (ref 36.0–46.0)
Hemoglobin: 7.4 g/dL — ABNORMAL LOW (ref 12.0–15.0)
Immature Granulocytes: 2 %
Lymphocytes Relative: 16 %
Lymphs Abs: 0.8 10*3/uL (ref 0.7–4.0)
MCH: 28.5 pg (ref 26.0–34.0)
MCHC: 29.7 g/dL — ABNORMAL LOW (ref 30.0–36.0)
MCV: 95.8 fL (ref 80.0–100.0)
Monocytes Absolute: 0.3 10*3/uL (ref 0.1–1.0)
Monocytes Relative: 5 %
Neutro Abs: 3.7 10*3/uL (ref 1.7–7.7)
Neutrophils Relative %: 74 %
Platelets: 175 10*3/uL (ref 150–400)
RBC: 2.6 MIL/uL — ABNORMAL LOW (ref 3.87–5.11)
RDW: 15.3 % (ref 11.5–15.5)
WBC: 5.1 10*3/uL (ref 4.0–10.5)
nRBC: 0 % (ref 0.0–0.2)

## 2021-01-20 LAB — COMPREHENSIVE METABOLIC PANEL
ALT: 71 U/L — ABNORMAL HIGH (ref 0–44)
AST: 34 U/L (ref 15–41)
Albumin: 1.9 g/dL — ABNORMAL LOW (ref 3.5–5.0)
Alkaline Phosphatase: 107 U/L (ref 38–126)
Anion gap: 8 (ref 5–15)
BUN: 59 mg/dL — ABNORMAL HIGH (ref 8–23)
CO2: 20 mmol/L — ABNORMAL LOW (ref 22–32)
Calcium: 8 mg/dL — ABNORMAL LOW (ref 8.9–10.3)
Chloride: 120 mmol/L — ABNORMAL HIGH (ref 98–111)
Creatinine, Ser: 2.81 mg/dL — ABNORMAL HIGH (ref 0.44–1.00)
GFR, Estimated: 17 mL/min — ABNORMAL LOW (ref >60)
Glucose, Bld: 245 mg/dL — ABNORMAL HIGH (ref 70–99)
Potassium: 4.9 mmol/L (ref 3.5–5.1)
Sodium: 148 mmol/L — ABNORMAL HIGH (ref 135–145)
Total Bilirubin: 0.4 mg/dL (ref 0.3–1.2)
Total Protein: 5.3 g/dL — ABNORMAL LOW (ref 6.5–8.1)

## 2021-01-20 LAB — GLUCOSE, CAPILLARY
Glucose-Capillary: 158 mg/dL — ABNORMAL HIGH (ref 70–99)
Glucose-Capillary: 159 mg/dL — ABNORMAL HIGH (ref 70–99)
Glucose-Capillary: 200 mg/dL — ABNORMAL HIGH (ref 70–99)
Glucose-Capillary: 210 mg/dL — ABNORMAL HIGH (ref 70–99)
Glucose-Capillary: 216 mg/dL — ABNORMAL HIGH (ref 70–99)
Glucose-Capillary: 233 mg/dL — ABNORMAL HIGH (ref 70–99)

## 2021-01-20 LAB — PHOSPHORUS: Phosphorus: 3.6 mg/dL (ref 2.5–4.6)

## 2021-01-20 LAB — MAGNESIUM: Magnesium: 2.3 mg/dL (ref 1.7–2.4)

## 2021-01-20 LAB — PREPARE RBC (CROSSMATCH)

## 2021-01-20 MED ORDER — SODIUM CHLORIDE 0.9% IV SOLUTION: Freq: Once | INTRAVENOUS | Status: AC

## 2021-01-20 MED ORDER — FUROSEMIDE 10 MG/ML IJ SOLN
40.0000 mg | Freq: Once | INTRAMUSCULAR | Status: AC
  Administered 2021-01-20: 40 mg via INTRAVENOUS
  Filled 2021-01-20: qty 4

## 2021-01-20 MED ORDER — INSULIN GLARGINE 100 UNIT/ML ~~LOC~~ SOLN
5.0000 [IU] | Freq: Two times a day (BID) | SUBCUTANEOUS | Status: DC
  Administered 2021-01-20 – 2021-02-19 (×57): 5 [IU] via SUBCUTANEOUS
  Filled 2021-01-20 (×61): qty 0.05

## 2021-01-20 NOTE — Care Management Important Message (Signed)
Important Message  Patient Details IM Letter placed in Patient's door [place]addy. Name: [place] MRN: [id number] Date of Birth: 08-Feb-1946   Medicare Important Message Given:  Yes     Kerin Salen 01/20/2021, 2:02 PM

## 2021-01-20 NOTE — Progress Notes (Signed)
CRITICAL VALUE ALERT  Critical Value: Hemoglobin- 6.7  Date & Time Notied:  01/20/2021 @ 12pm  Provider Notified: Dr. Nevada Crane  Orders Received/Actions taken: see order.

## 2021-01-20 NOTE — Progress Notes (Signed)
PROGRESS NOTE  Vermont Matkins DPO:[id number] DOB: [birth year] DOA: 01/02/2021 PCP: [name], FNP  HPI/Recap of past 24 hours: Desiree Pollard is a 75 year old female with past medical history significant for previous stroke with residual left-sided weakness, expressive aphasia, dementia, CKD stage IV, diabetes who presents from her nursing facility due to altered mental status.  She was found to have hypernatremia as well as COVID-19.  She was started on Remdesivir.  There was initial concern for angioedema.  PCCM was consulted but did not feel that she required intubation.  MRI brain unremarkable for acute change. Hospitalization complicated by continued encephalopathy.  01/20/21: Seen and examined at bedside.  Her Sister [name] is in the room.  The patient is somnolent and interacts minimally.  Drop of hemoglobin this morning despite 1 unit PRBC transfused on 01/19/2021, down to 6.7K.  No overt bleeding.  Transfuse 1 unit of PRBC.  Volume overload with significant anasarca, renal function improving, will give 1 dose of IV Lasix 40 mg.  Assessment/Plan: Principal Problem:   Acute metabolic encephalopathy Active Problems:   Insulin dependent type 2 diabetes mellitus (HCC)   Hypertension   History of stroke   CKD (chronic kidney disease), stage IV ([place])   COVID-19 virus infection   Hypernatremia  Persistent acute metabolic encephalopathy -At baseline, patient is conversant per sister.  Able to walk with walker at baseline.  She has been somnolent for days, opens her eyes and drifts back to sleep. -Likely multifactorial secondary to hypernatremia, AKI, COVID-19 viral infection in setting of underlying dementia -CT head x2 without acute intracranial abnormality -EEG suggestive of moderate to severe diffuse encephalopathy, no epileptiform discharges. -MRI brain without acute intracranial abnormality  -Dr. Curly [facility] on 01/08/21, recommended treating the treatable, hypernatremia,  AKI, COVID-19 viral infection. She is still not at her baseline. -Cortrak in place for nutrition, failed swallow evaluation. Speech therapist recommended n.p.o. and PEG tube as an alternative to permanent feeding.  Acute on chronic anemia of chronic disease Baseline Hg appears to be 9.8 Received 1 unit PRBC on 01/19/2021 Drop of hemoglobin down to 6.7 this morning. Transfuse another unit of blood on 01/20/2021 to maintain hemoglobin greater than 7.0. No overt bleeding. Obtain FOBT, and iron studies. Repeat H&H posttransfusion.  Anasarca secondary to hypoalbuminemia Albumin 1.9 on 01/20/2021 1 dose of IV Lasix 40 mg daily. Elevate extremities.  Severe dysphagia post cortrak placement. Appreciate speech therapist evaluation Cortrack placed Aspiration precaution in place.    Improving AKI on CKD stage IV likely prerenal in the setting of dehydration and poor oral intake. -Baseline creatinine 2.6, no evidence of hydronephrosis on renal ultrasound 01/10/2021. Creatinine improving 2.8 from 3.07. 1 dose IV Lasix 40 mg daily due to anasarca. Continue to follow renal function.  COVID-19 viral infection -Tested positive on 01/02/21 -Remdesivir x 3 doses -Supportive treatments as ordered, encourage IS, encourage prone positioning, encourage mobilization as able. -Currently on room air with O2 saturation 99%.  Resolved on IV fluid: Hypernatremia Free water flushes through tube feeding  Possible angioedema -Resolved, patient maintaining airway.  CT neck reassuring without significant swelling  HTN BP is improving after increasing dose of coreg to 6.25 mg BID -Continue as needed IV hydralazine with parameters Continue to closely monitor vital signs  Chronic diastolic CHF -EF 43-15%  -Hold off on further diuretics, continue Coreg Monitor strict I's and O's and daily weight. Net I&O +562CC  History of stroke -Diagnosed October 2021, with residual left-sided weakness and expressive  aphasia -Continue  Plavix, Lipitor -PT has recommended SNF  Resolved fever No fevers recorded in the last 24 hours.  DVT prophylaxis:  SQ heparin 3 times daily Place and maintain sequential compression device Start: 01/02/21 2308  Code Status: Full code, confirmed with sister over the phone Family Communication:  Updated her Sister [name] at bedside on 01/20/2021.   Status is: Inpatient    Dispo: The patient is from: Home.              Anticipated d/c is to: SNF.              Anticipated d/c date is: 01/24/2021.               Patient currently not stable for discharge due to persistent altered mental status.   Difficult to place patient N/A        Objective: Vitals:   01/20/21 0000 01/20/21 0413 01/20/21 0600 01/20/21 1200  BP:  (!) 153/66 (!) 148/69 (!) 157/58  Pulse:    69  Resp:  16  (!) 21  Temp: (!) 96.8 F (36 C) 98.3 F (36.8 C) 98.8 F (37.1 C) 98.6 F (37 C)  TempSrc: Rectal Axillary Oral Axillary  SpO2:    100%  Weight:  85 kg    Height:        Intake/Output Summary (Last 24 hours) at 01/20/2021 1446 Last data filed at 01/20/2021 1400 Gross per 24 hour  Intake 3122 ml  Output 1020 ml  Net 2102 ml   Filed Weights   01/18/21 0425 01/19/21 0441 01/20/21 0413  Weight: 79.1 kg 80.5 kg 85 kg    Exam:   . General: 75 y.o. year-old female chronically ill-appearing in no acute distress.  She is somnolent but arousable to voices.   . Cardiovascular: Regular rate and rhythm no rubs or gallops.   Marland Kitchen Respiratory: Clear to auscultation no wheezes or rales.  Poor inspiratory effort. . Abdomen: Obese nontender normal bowel sounds present.  . Musculoskeletal: Anasarca.  Upper and lower extremity edema bilaterally. Marland Kitchen Psychiatry: Unable to assess mood due to somnolence.   Data Reviewed: CBC: Recent Labs  Lab 01/15/21 0443 01/16/21 0759 01/18/21 0655 01/19/21 0820 01/20/21 0338 01/20/21 1103  WBC 5.7 5.6 6.1 4.7 5.1  --   NEUTROABS  --   --  4.6  --   3.7  --   HGB 7.3* 7.5* 7.2* 7.0* 7.4* 6.7*  HCT 24.7* 25.7* 24.9* 23.7* 24.9* 22.8*  MCV 96.9 97.3 95.4 97.5 95.8  --   PLT 146* 177 218 194 175  --    Basic Metabolic Panel: Recent Labs  Lab 01/15/21 0443 01/16/21 0759 01/18/21 0655 01/19/21 0820 01/20/21 0338  NA 146* 145 148* 147* 148*  K 4.4 4.2 4.0 4.7 4.9  CL 118* 123* 123* 120* 120*  CO2 18* 19* 20* 20* 20*  GLUCOSE 131* 105* 203* 270* 245*  BUN 67* 58* 59* 61* 59*  CREATININE 3.06* 3.07* 2.98* 2.98* 2.81*  CALCIUM 8.6* 8.5* 8.2* 8.0* 8.0*  MG  --   --  2.5*  --  2.3  PHOS  --   --  3.7  --  3.6   GFR: Estimated Creatinine Clearance: 19.1 mL/min (A) (by C-G formula based on SCr of 2.81 mg/dL (H)). Liver Function Tests: Recent Labs  Lab 01/18/21 0655 01/20/21 0338  AST 38 34  ALT 85* 71*  ALKPHOS 105 107  BILITOT 0.2* 0.4  PROT 5.6* 5.3*  ALBUMIN 2.2* 1.9*  No results for input(s): LIPASE, AMYLASE in the last 168 hours. No results for input(s): AMMONIA in the last 168 hours. Coagulation Profile: No results for input(s): INR, PROTIME in the last 168 hours. Cardiac Enzymes: No results for input(s): CKTOTAL, CKMB, CKMBINDEX, TROPONINI in the last 168 hours. BNP (last 3 results) No results for input(s): PROBNP in the last 8760 hours. HbA1C: No results for input(s): HGBA1C in the last 72 hours. CBG: Recent Labs  Lab 01/19/21 2015 01/20/21 0010 01/20/21 0409 01/20/21 0908 01/20/21 1220  GLUCAP 201* 233* 216* 159* 158*   Lipid Profile: No results for input(s): CHOL, HDL, LDLCALC, TRIG, CHOLHDL, LDLDIRECT in the last 72 hours. Thyroid Function Tests: No results for input(s): TSH, T4TOTAL, FREET4, T3FREE, THYROIDAB in the last 72 hours. Anemia Panel: No results for input(s): VITAMINB12, FOLATE, FERRITIN, TIBC, IRON, RETICCTPCT in the last 72 hours. Urine analysis:    Component Value Date/Time   COLORURINE YELLOW 01/02/2021 2053   APPEARANCEUR CLEAR 01/02/2021 2053   LABSPEC 1.014 01/02/2021 2053    PHURINE 5.0 01/02/2021 2053   GLUCOSEU 50 (A) 01/02/2021 2053   HGBUR NEGATIVE 01/02/2021 2053   BILIRUBINUR NEGATIVE 01/02/2021 2053   KETONESUR NEGATIVE 01/02/2021 2053   PROTEINUR >=300 (A) 01/02/2021 2053   NITRITE NEGATIVE 01/02/2021 2053   LEUKOCYTESUR NEGATIVE 01/02/2021 2053   Sepsis Labs: @LABRCNTIP (procalcitonin:4,lacticidven:4)  )No results found for this or any previous visit (from the past 240 hour(s)).    Studies: No results found.  Scheduled Meds: . atorvastatin  80 mg Per Tube Daily  . carvedilol  6.25 mg Per NG tube BID WC  . chlorhexidine  15 mL Mouth Rinse BID  . Chlorhexidine Gluconate Cloth  6 each Topical Q0600  . clopidogrel  75 mg Per Tube Daily  . dorzolamide-timolol  1 drop Both Eyes BID  . ferrous sulfate  300 mg Per Tube QODAY  . free water  200 mL Per Tube Q6H  . furosemide  40 mg Intravenous Once  . heparin injection (subcutaneous)  5,000 Units Subcutaneous Q8H  . hydrALAZINE  25 mg Per Tube Q8H  . insulin aspart  0-9 Units Subcutaneous Q4H  . insulin glargine  5 Units Subcutaneous BID  . mouth rinse  15 mL Mouth Rinse q12n4p  . nystatin  5 mL Mouth/Throat QID  . pantoprazole sodium  40 mg Per Tube Daily    Continuous Infusions: . feeding supplement (OSMOLITE 1.5 CAL) 60 mL/hr at 01/20/21 0648     LOS: 17 days     Kayleen Memos, MD [facility] Pager [phone]  If 7PM-7AM, please contact night-coverage [email or web address] Password [facility] 01/20/2021, 2:46 PM

## 2021-01-20 NOTE — Progress Notes (Signed)
Patient's husband and sister updated on COVID patients visitation policy again, also updated patient's husband and sister on patient status and plan of care. Explained to them that based COVID policy that patient are not to have visitors unless they meet some criteria  To help prevent the spread infection. Patient sister stated she understand and the husband stated he has his own rules too. I apologized to them and reinforced that the care provider will keep updating them and encourage them to have one designated family member to give information to that can relay the information to other family member. The sister stated the patient leaves with her not the husband but they will work it out.

## 2021-01-21 ENCOUNTER — Inpatient Hospital Stay (HOSPITAL_COMMUNITY): Payer: Medicare HMO

## 2021-01-21 DIAGNOSIS — E870 Hyperosmolality and hypernatremia: Secondary | ICD-10-CM | POA: Diagnosis not present

## 2021-01-21 LAB — BASIC METABOLIC PANEL
Anion gap: 9 (ref 5–15)
BUN: 61 mg/dL — ABNORMAL HIGH (ref 8–23)
CO2: 22 mmol/L (ref 22–32)
Calcium: 8.6 mg/dL — ABNORMAL LOW (ref 8.9–10.3)
Chloride: 119 mmol/L — ABNORMAL HIGH (ref 98–111)
Creatinine, Ser: 2.94 mg/dL — ABNORMAL HIGH (ref 0.44–1.00)
GFR, Estimated: 16 mL/min — ABNORMAL LOW (ref >60)
Glucose, Bld: 221 mg/dL — ABNORMAL HIGH (ref 70–99)
Potassium: 5 mmol/L (ref 3.5–5.1)
Sodium: 150 mmol/L — ABNORMAL HIGH (ref 135–145)

## 2021-01-21 LAB — CBC
HCT: 26.9 % — ABNORMAL LOW (ref 36.0–46.0)
Hemoglobin: 8.1 g/dL — ABNORMAL LOW (ref 12.0–15.0)
MCH: 29.2 pg (ref 26.0–34.0)
MCHC: 30.1 g/dL (ref 30.0–36.0)
MCV: 97.1 fL (ref 80.0–100.0)
Platelets: 173 10*3/uL (ref 150–400)
RBC: 2.77 MIL/uL — ABNORMAL LOW (ref 3.87–5.11)
RDW: 15.1 % (ref 11.5–15.5)
WBC: 4.5 10*3/uL (ref 4.0–10.5)
nRBC: 0 % (ref 0.0–0.2)

## 2021-01-21 LAB — GLUCOSE, CAPILLARY
Glucose-Capillary: 105 mg/dL — ABNORMAL HIGH (ref 70–99)
Glucose-Capillary: 136 mg/dL — ABNORMAL HIGH (ref 70–99)
Glucose-Capillary: 190 mg/dL — ABNORMAL HIGH (ref 70–99)
Glucose-Capillary: 200 mg/dL — ABNORMAL HIGH (ref 70–99)
Glucose-Capillary: 209 mg/dL — ABNORMAL HIGH (ref 70–99)
Glucose-Capillary: 213 mg/dL — ABNORMAL HIGH (ref 70–99)

## 2021-01-21 LAB — TYPE AND SCREEN
ABO/RH(D): O POS
Antibody Screen: NEGATIVE
Unit division: 0
Unit division: 0

## 2021-01-21 LAB — BPAM RBC
Blood Product Expiration Date: 202202182359
Blood Product Expiration Date: 202203072359
ISSUE DATE / TIME: 202202061758
ISSUE DATE / TIME: 202202071733
Unit Type and Rh: 5100
Unit Type and Rh: 5100

## 2021-01-21 LAB — IRON AND TIBC
Iron: 103 ug/dL (ref 28–170)
Saturation Ratios: 39 % — ABNORMAL HIGH (ref 10.4–31.8)
TIBC: 267 ug/dL (ref 250–450)
UIBC: 164 ug/dL

## 2021-01-21 LAB — OCCULT BLOOD X 1 CARD TO LAB, STOOL: Fecal Occult Bld: NEGATIVE

## 2021-01-21 LAB — FERRITIN: Ferritin: 83 ng/mL (ref 11–307)

## 2021-01-21 IMAGING — DX DG ABDOMEN 1V
1 series · 1 of 1 positions shown · non-contrast
Comparison: [DATE]

CLINICAL DATA: NG tube placement

EXAM:
ABDOMEN - 1 VIEW

[abdomen kub]
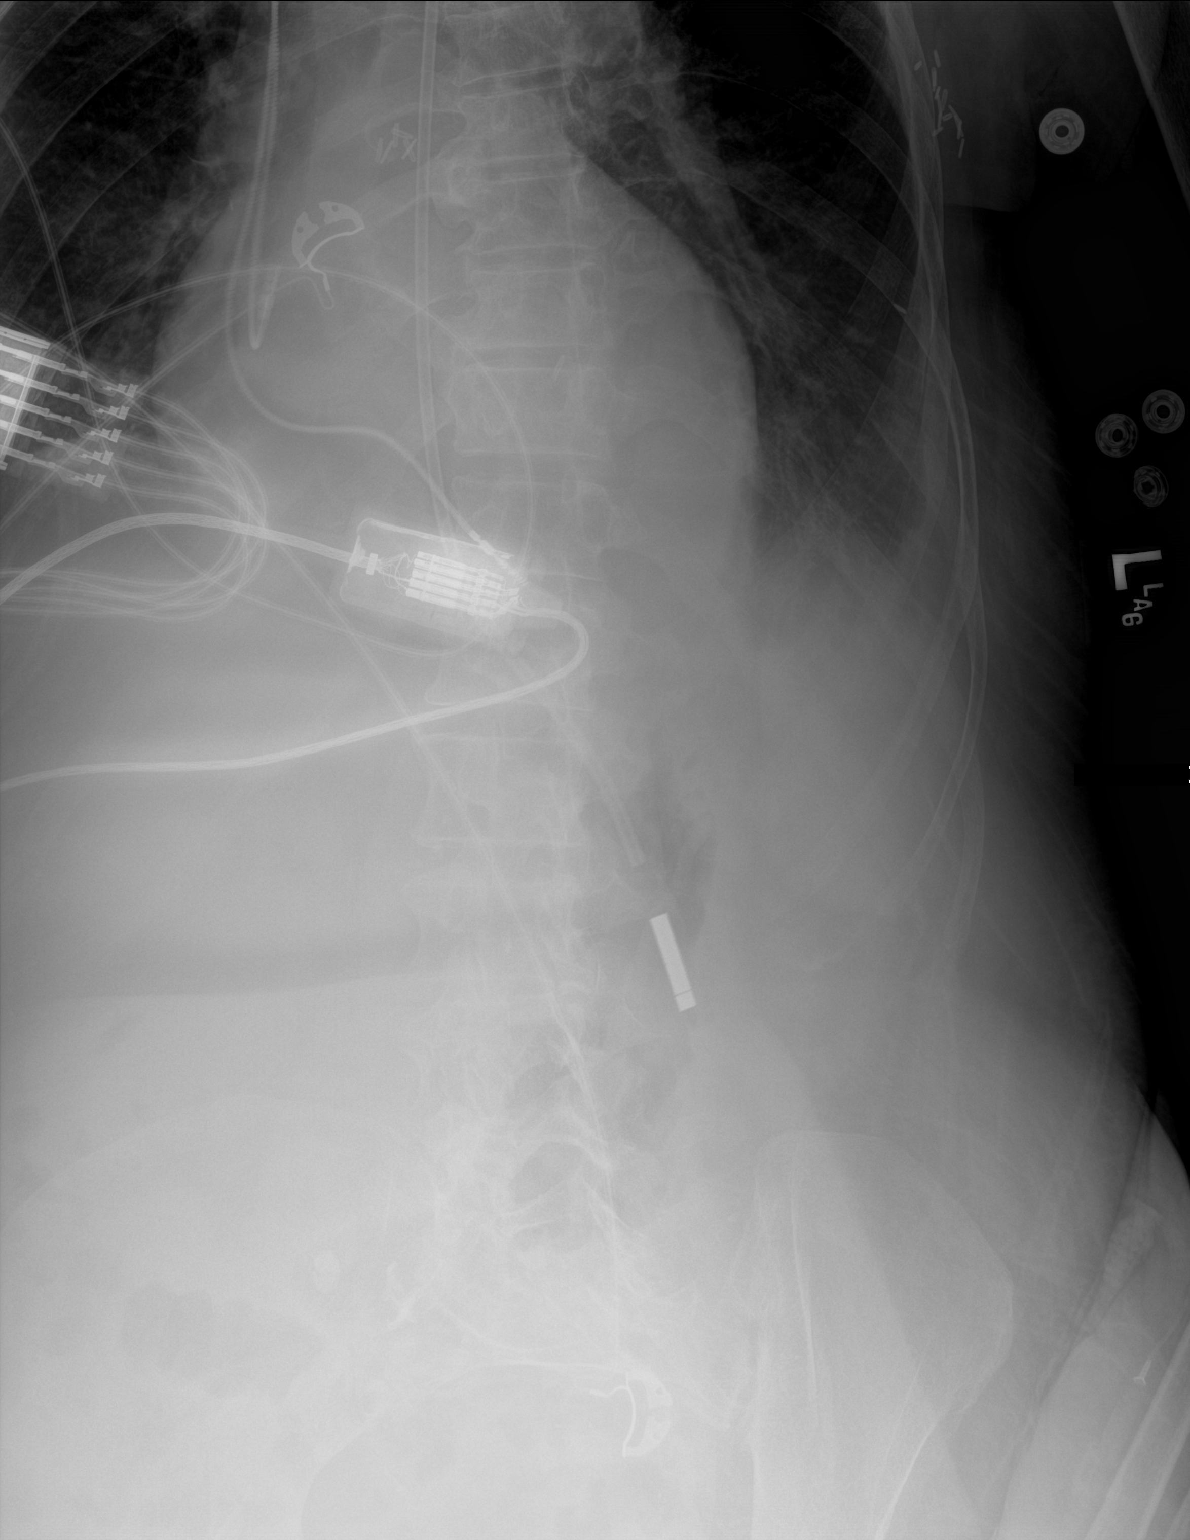

[1 of 1 positions shown; findings below may reference images not displayed]

FINDINGS: Feeding tube in place with the tip in the proximal stomach.
IMPRESSION: Feeding tube tip in the proximal stomach.

## 2021-01-21 MED ORDER — HYDRALAZINE HCL 50 MG PO TABS
50.0000 mg | ORAL_TABLET | Freq: Three times a day (TID) | ORAL | Status: DC
  Administered 2021-01-21 – 2021-02-19 (×84): 50 mg
  Filled 2021-01-21 (×84): qty 1

## 2021-01-21 NOTE — TOC Progression Note (Signed)
Transition of Care [facility]) - Progression Note    Patient Details  Name: Desiree Pollard MRN: [id number] Date of Birth: 1946-08-08  [facility] of Care [facility] CM/SW Contact  Ross Ludwig, [place] Phone Number: 01/21/2021, 3:26 PM  Clinical Narrative:     Patient is a long term care resident at [facility].  Patient has [employer] Medicare managed by [employer].  SNF is requesting that insurance authorization be started.  CSW to continue to follow patient's progress throughout discharge planning.        Expected Discharge Plan and Services                                                 Social Determinants of Health (SDOH) Interventions    Readmission Risk Interventions Readmission Risk Prevention Plan 01/13/2021  Transportation Screening Complete  PCP or Specialist Appt within 3-5 Days Complete  Social Work Consult for [place] Planning/Counseling Complete  Palliative Care Screening Complete  Medication Review [employer]) Referral to Pharmacy

## 2021-01-21 NOTE — Plan of Care (Signed)
   Problem: Clinical Measurements: Goal: Diagnostic test results will improve Outcome: Progressing   Problem: Nutrition: Goal: Adequate nutrition will be maintained Outcome: Progressing   Problem: Safety: Goal: Ability to remain free from injury will improve Outcome: Progressing   

## 2021-01-21 NOTE — Progress Notes (Signed)
PROGRESS NOTE  Desiree Pollard TKZ:[id number] DOB: July 30, 1946 DOA: 01/02/2021 PCP: [name], FNP  HPI/Recap of past 24 hours: Desiree Pollard is a 75 year old female with past medical history significant for previous stroke with residual left-sided weakness, expressive aphasia, dementia, CKD stage IV, diabetes who presents from her nursing facility due to altered mental status.  She was found to have hypernatremia as well as COVID-19.  She was started on Remdesivir.  There was initial concern for angioedema.  PCCM was consulted but did not feel that she required intubation.  MRI brain unremarkable for acute change. Hospitalization complicated by continued encephalopathy.  01/21/21: Seen and examined at her bedside.  She is more responsive today.  And answers to simple questions.  No family members at bedside.  She denies having any pain or difficulties with breathing.  Assessment/Plan: Principal Problem:   Acute metabolic encephalopathy Active Problems:   Insulin dependent type 2 diabetes mellitus (HCC)   Hypertension   History of stroke   CKD (chronic kidney disease), stage IV ([place])   COVID-19 virus infection   Hypernatremia  Improving acute metabolic encephalopathy -At baseline, patient is conversant per sister.  Able to walk with walker at baseline.  She has been somnolent for days, opens her eyes and drifts back to sleep but today she is more responsive and answers simple questions. -Likely multifactorial secondary to hypernatremia, AKI, COVID-19 viral infection in setting of underlying dementia -CT head x2 without acute intracranial abnormality -EEG suggestive of moderate to severe diffuse encephalopathy, no epileptiform discharges. -MRI brain without acute intracranial abnormality  -Dr. Curly [facility] Neurology on 01/08/21, recommended treating the treatable, hypernatremia, AKI, COVID-19 viral infection. -Cortrak in place for nutrition, failed swallow evaluation. Speech therapist  recommended n.p.o. and PEG tube as an alternative to permanent feeding.  Acute on chronic anemia of chronic disease Baseline Hg appears to be 9.8 Received 1 unit PRBC on 01/19/2021 Drop of hemoglobin down to 6.7 on 2 05/20/2021. Received another unit of blood on 01/20/2021 to maintain hemoglobin greater than 7.0. FOBT negative. No overt bleeding. Continue to monitor H&H.  Anasarca secondary to hypoalbuminemia Albumin 1.9 on 01/20/2021 1 dose of IV Lasix 40 mg daily given on 01/20/2021. Elevate extremities as able.  Severe dysphagia post cortrak placement. Appreciate speech therapist evaluation Cortrack in place To maintain head of bed greater than 30 degrees Continue aspiration precaution in place.    Nonoliguric AKI on CKD stage IV likely prerenal in the setting of dehydration and poor oral intake. -Baseline creatinine 2.6, no evidence of hydronephrosis on renal ultrasound 01/10/2021. Mild rising of creatinine 2.9 from 2.8 from 3.07. Continue to avoid nephrotoxic agents, dehydration and hypotension. Continue to monitor urine output 2.6 L urine output recorded in the last 24 hours. Continue to follow renal function.  COVID-19 viral infection -Tested positive on 01/02/21 -Remdesivir x 3 doses -Supportive treatments as ordered, encourage IS, encourage prone positioning, encourage mobilization as able. -Currently on room air with O2 saturation 99%.  Resolved on IV fluid: Hypernatremia Free water flushes through tube feeding  Possible angioedema -Resolved, patient maintaining airway.  CT neck reassuring without significant swelling  HTN BP is not at goal Increase hydralazine dose to 50 mg 3 times daily. Coreg 6.25 mg twice daily. Continue to monitor vital signs. IV hydralazine as needed with parameters.  Chronic diastolic CHF -EF 57-32%  -Diuretics were held due to rising of creatinine. Continue to monitor strict I's and O's and daily weight. Net I&O +5.6 L  History of  stroke -Diagnosed October 2021, with residual left-sided weakness and expressive aphasia -Continue Plavix, Lipitor via tube feeding -PT has recommended SNF  Resolved fever No fevers recorded in the last 24 hours.  Goals of care Very poor prognosis in the setting of history of stroke, persistent encephalopathy, severe dysphagia, CKD, generalized weakness, recent COVID-19 infection, hypoalbuminemia with anasarca, anemia of chronic disease requiring blood transfusion, and advanced age. Palliative care consult to assist with establishing goals of care.  DVT prophylaxis:  SQ heparin 3 times daily Place and maintain sequential compression device Start: 01/02/21 2308  Code Status: Full code Family Communication:  Updated her Sister Desiree Pollard at bedside on 01/20/2021.   Status is: Inpatient    Dispo: The patient is from: Home.              Anticipated d/c is to: SNF.              Anticipated d/c date is: 01/24/2021.               Patient currently not stable for discharge due to persistent altered mental status and Cortrak dependent.   Difficult to place patient N/A        Objective: Vitals:   01/20/21 2016 01/21/21 0302 01/21/21 0503 01/21/21 1400  BP: (!) 156/61 (!) 166/85 (!) 163/69 (!) 159/74  Pulse: 68 67 61 70  Resp: 18 16 19 20   Temp: 98.3 F (36.8 C) (!) 97 F (36.1 C) 97.9 F (36.6 C) 97.8 F (36.6 C)  TempSrc: Rectal Rectal Rectal Rectal  SpO2: 100% 100% 100% 100%  Weight:   79.7 kg   Height:        Intake/Output Summary (Last 24 hours) at 01/21/2021 1757 Last data filed at 01/21/2021 9562 Gross per 24 hour  Intake 1470 ml  Output 2000 ml  Net -530 ml   Filed Weights   01/19/21 0441 01/20/21 0413 01/21/21 0503  Weight: 80.5 kg 85 kg 79.7 kg    Exam:   . General: 75 y.o. year-old female chronically ill-appearing in no acute distress.  A little more responsive today, answers questions. . Cardiovascular: Regular rate and rhythm no rubs or  gallops. Marland Kitchen Respiratory: Clear to auscultation no wheezing or rales.   . Abdomen: Obese nontender normal bowel sounds present. . Musculoskeletal: Anasarca.  Upper and lower extremity edema. Marland Kitchen Psychiatry: Mood is appropriate for condition and setting.   Data Reviewed: CBC: Recent Labs  Lab 01/16/21 0759 01/18/21 0655 01/19/21 0820 01/20/21 0338 01/20/21 1103 01/20/21 2245 01/21/21 0422  WBC 5.6 6.1 4.7 5.1  --   --  4.5  NEUTROABS  --  4.6  --  3.7  --   --   --   HGB 7.5* 7.2* 7.0* 7.4* 6.7* 8.1* 8.1*  HCT 25.7* 24.9* 23.7* 24.9* 22.8* 27.0* 26.9*  MCV 97.3 95.4 97.5 95.8  --   --  97.1  PLT 177 218 194 175  --   --  130   Basic Metabolic Panel: Recent Labs  Lab 01/16/21 0759 01/18/21 0655 01/19/21 0820 01/20/21 0338 01/21/21 0422  NA 145 148* 147* 148* 150*  K 4.2 4.0 4.7 4.9 5.0  CL 123* 123* 120* 120* 119*  CO2 19* 20* 20* 20* 22  GLUCOSE 105* 203* 270* 245* 221*  BUN 58* 59* 61* 59* 61*  CREATININE 3.07* 2.98* 2.98* 2.81* 2.94*  CALCIUM 8.5* 8.2* 8.0* 8.0* 8.6*  MG  --  2.5*  --  2.3  --   PHOS  --  3.7  --  3.6  --    GFR: Estimated Creatinine Clearance: 17.7 mL/min (A) (by C-G formula based on SCr of 2.94 mg/dL (H)). Liver Function Tests: Recent Labs  Lab 01/18/21 0655 01/20/21 0338  AST 38 34  ALT 85* 71*  ALKPHOS 105 107  BILITOT 0.2* 0.4  PROT 5.6* 5.3*  ALBUMIN 2.2* 1.9*   No results for input(s): LIPASE, AMYLASE in the last 168 hours. No results for input(s): AMMONIA in the last 168 hours. Coagulation Profile: No results for input(s): INR, PROTIME in the last 168 hours. Cardiac Enzymes: No results for input(s): CKTOTAL, CKMB, CKMBINDEX, TROPONINI in the last 168 hours. BNP (last 3 results) No results for input(s): PROBNP in the last 8760 hours. HbA1C: No results for input(s): HGBA1C in the last 72 hours. CBG: Recent Labs  Lab 01/21/21 0010 01/21/21 0438 01/21/21 0758 01/21/21 1131 01/21/21 1712  GLUCAP 209* 190* 200* 213* 136*    Lipid Profile: No results for input(s): CHOL, HDL, LDLCALC, TRIG, CHOLHDL, LDLDIRECT in the last 72 hours. Thyroid Function Tests: No results for input(s): TSH, T4TOTAL, FREET4, T3FREE, THYROIDAB in the last 72 hours. Anemia Panel: Recent Labs    01/21/21 0422  FERRITIN 83  TIBC 267  IRON 103   Urine analysis:    Component Value Date/Time   COLORURINE YELLOW 01/02/2021 2053   APPEARANCEUR CLEAR 01/02/2021 2053   LABSPEC 1.014 01/02/2021 2053   PHURINE 5.0 01/02/2021 2053   GLUCOSEU 50 (A) 01/02/2021 2053   HGBUR NEGATIVE 01/02/2021 2053   BILIRUBINUR NEGATIVE 01/02/2021 2053   KETONESUR NEGATIVE 01/02/2021 2053   PROTEINUR >=300 (A) 01/02/2021 2053   NITRITE NEGATIVE 01/02/2021 2053   LEUKOCYTESUR NEGATIVE 01/02/2021 2053   Sepsis Labs: @LABRCNTIP (procalcitonin:4,lacticidven:4)  )No results found for this or any previous visit (from the past 240 hour(s)).    Studies: DG Abd 1 View  Result Date: 01/21/2021 CLINICAL DATA:  NG tube placement EXAM: ABDOMEN - 1 VIEW COMPARISON:  01/16/2021 FINDINGS: Feeding tube in place with the tip in the proximal stomach. IMPRESSION: Feeding tube tip in the proximal stomach. Electronically Signed   By: Rolm Baptise M.D.   On: 01/21/2021 03:40    Scheduled Meds: . atorvastatin  80 mg Per Tube Daily  . carvedilol  6.25 mg Per NG tube BID WC  . chlorhexidine  15 mL Mouth Rinse BID  . Chlorhexidine Gluconate Cloth  6 each Topical Q0600  . clopidogrel  75 mg Per Tube Daily  . dorzolamide-timolol  1 drop Both Eyes BID  . ferrous sulfate  300 mg Per Tube QODAY  . free water  200 mL Per Tube Q6H  . heparin injection (subcutaneous)  5,000 Units Subcutaneous Q8H  . hydrALAZINE  50 mg Per Tube Q8H  . insulin aspart  0-9 Units Subcutaneous Q4H  . insulin glargine  5 Units Subcutaneous BID  . mouth rinse  15 mL Mouth Rinse q12n4p  . nystatin  5 mL Mouth/Throat QID  . pantoprazole sodium  40 mg Per Tube Daily    Continuous Infusions: .  feeding supplement (OSMOLITE 1.5 CAL) 1,000 mL (01/20/21 2002)     LOS: 18 days     Kayleen Memos, MD [facility] Pager [phone]  If 7PM-7AM, please contact night-coverage [email or web address] Password [facility] 01/21/2021, 5:57 PM

## 2021-01-21 NOTE — Progress Notes (Signed)
Speech Language Pathology Treatment:    Patient Details Name: Desiree Pollard MRN: [id number] DOB: 1946-04-23 Today's Date: 01/21/2021 Time: 3570-1779 SLP Time Calculation (min) (ACUTE ONLY): 24 min  Assessment / Plan / Recommendation Clinical Impression  Pt seen to assess potential for po readiness, participation in swallowing therapy.  Pt with eyes open upon SLP entrance room and she responded to SLP questions.  She was observed to swallow her saliva x1 early in session - presume this was reflexive due reflexive pharyngeal swallow with HOB elevation.  No further swallows observed despite max verbal, visual and  tactile *spoon to tongue* cues.  Suspect pt's aphasia is negatively impacting her abilty to follow directions.   Pooling of secretions again observed (right more than left)  and pt benefited from oral suctioning.    Open mouth posture continues and pt with congested breathing heard at pharynx consistent with potential  snoring respirations.  SLP questions if pt may have some apnea due to lingual/palatal obstruction of airway.  Following sliding pt up in bed and sitting upright, pt observed to doze off and RR varied from 12 to 22 with pt at rest and appearance of hypopnea and/or apnea.  SLP provided thermal stimulation to anterior oral cavity with ice chip and ice cream, however pt pulled away.  Pt verbalizes "yes" when asked if she wants to try icecream but her very limited sustained attention, poor mentation prevents her from even attempting to swallow.    Given pt's ongoing lack of improvement with swallowing, highly advise pallliative consult to establish Wade.  SLP would not advise PEG as it will not prevent pt from aspirating - and currently she is not managing her secretions.  In addition,  literature does not support placing feeding tubes in pt's over the age of 93 with dementia.    SLP will continue efforts - but prognosis for functional swallow to be obtained in this setting is guarded  at best.  Frequent oral care continues to be advised.    Recommend consider PT/OT consult - per RN - pt's sister advised pt needed 2 people to help her transfer and she would participate in stand pivot.     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  Imaging of brain previously has shown Stable appearance of right frontal encephalomalacia and bilateral  lacunar type infarcts in the basal ganglia and cerebellar  hemispheres. . Expected evolution of encephalomalacia along the right  inferior cerebellum corresponding with a presumed subacute infarct  on comparison CT.  3. Chronic microvascular angiopathy and parenchymal volume loss.  CVAs have left pt with aphasia and left weakness per Admit note.  Most recent CXR concerning for potential infectious process.  Swallow eval ordered and pt has not been able to consume po intake due to dysphagia and mentation.  She remains a full code and currently has a small bore feeding tube placed. SLP follow up to determine potential readiness for po intake, ability for pt to participate in active swallowing therapy and indication for instrumental swallow evaluation.      SLP Plan  Continue with current plan of care       Recommendations  Diet recommendations: NPO (frequent oral care) Medication Administration: Via alternative means                Oral Care Recommendations: Oral care QID Follow up  Recommendations: 24 hour supervision/assistance;Skilled Nursing facility SLP Visit Diagnosis: Dysphagia, oropharyngeal phase (R13.12) Plan: Continue with current plan of care       GO                [name] 01/21/2021, 6:52 PM  Kathleen Lime, MS [facility] SLP Acute Rehab Services Office [phone] Pager [phone]

## 2021-01-21 NOTE — Progress Notes (Signed)
Patient caught trying to pull out Dobhoff. Hospitalist Ouma, E NP, made aware, with order to confirm placement by KUB and showed feeding tube tip in the proximal stomach. Per Hospitalist, can safely resume TF. Will continue to monitor

## 2021-01-22 DIAGNOSIS — G9341 Metabolic encephalopathy: Secondary | ICD-10-CM | POA: Diagnosis not present

## 2021-01-22 LAB — COMPREHENSIVE METABOLIC PANEL
ALT: 82 U/L — ABNORMAL HIGH (ref 0–44)
AST: 41 U/L (ref 15–41)
Albumin: 2.1 g/dL — ABNORMAL LOW (ref 3.5–5.0)
Alkaline Phosphatase: 110 U/L (ref 38–126)
Anion gap: 6 (ref 5–15)
BUN: 59 mg/dL — ABNORMAL HIGH (ref 8–23)
CO2: 23 mmol/L (ref 22–32)
Calcium: 8.5 mg/dL — ABNORMAL LOW (ref 8.9–10.3)
Chloride: 121 mmol/L — ABNORMAL HIGH (ref 98–111)
Creatinine, Ser: 2.67 mg/dL — ABNORMAL HIGH (ref 0.44–1.00)
GFR, Estimated: 18 mL/min — ABNORMAL LOW (ref >60)
Glucose, Bld: 197 mg/dL — ABNORMAL HIGH (ref 70–99)
Potassium: 5.4 mmol/L — ABNORMAL HIGH (ref 3.5–5.1)
Sodium: 150 mmol/L — ABNORMAL HIGH (ref 135–145)
Total Bilirubin: 0.8 mg/dL (ref 0.3–1.2)
Total Protein: 5.7 g/dL — ABNORMAL LOW (ref 6.5–8.1)

## 2021-01-22 LAB — GLUCOSE, CAPILLARY
Glucose-Capillary: 105 mg/dL — ABNORMAL HIGH (ref 70–99)
Glucose-Capillary: 172 mg/dL — ABNORMAL HIGH (ref 70–99)
Glucose-Capillary: 184 mg/dL — ABNORMAL HIGH (ref 70–99)
Glucose-Capillary: 207 mg/dL — ABNORMAL HIGH (ref 70–99)
Glucose-Capillary: 227 mg/dL — ABNORMAL HIGH (ref 70–99)
Glucose-Capillary: 84 mg/dL (ref 70–99)
Glucose-Capillary: 87 mg/dL (ref 70–99)

## 2021-01-22 LAB — CBC WITH DIFFERENTIAL/PLATELET
Abs Immature Granulocytes: 0.11 10*3/uL — ABNORMAL HIGH (ref 0.00–0.07)
Basophils Absolute: 0 10*3/uL (ref 0.0–0.1)
Basophils Relative: 1 %
Eosinophils Absolute: 0.1 10*3/uL (ref 0.0–0.5)
Eosinophils Relative: 3 %
HCT: 26.5 % — ABNORMAL LOW (ref 36.0–46.0)
Hemoglobin: 8 g/dL — ABNORMAL LOW (ref 12.0–15.0)
Immature Granulocytes: 3 %
Lymphocytes Relative: 15 %
Lymphs Abs: 0.6 10*3/uL — ABNORMAL LOW (ref 0.7–4.0)
MCH: 28.9 pg (ref 26.0–34.0)
MCHC: 30.2 g/dL (ref 30.0–36.0)
MCV: 95.7 fL (ref 80.0–100.0)
Monocytes Absolute: 0.2 10*3/uL (ref 0.1–1.0)
Monocytes Relative: 5 %
Neutro Abs: 3.1 10*3/uL (ref 1.7–7.7)
Neutrophils Relative %: 73 %
Platelets: 129 10*3/uL — ABNORMAL LOW (ref 150–400)
RBC: 2.77 MIL/uL — ABNORMAL LOW (ref 3.87–5.11)
RDW: 15.2 % (ref 11.5–15.5)
WBC: 4.2 10*3/uL (ref 4.0–10.5)
nRBC: 0 % (ref 0.0–0.2)

## 2021-01-22 LAB — PHOSPHORUS: Phosphorus: 3.5 mg/dL (ref 2.5–4.6)

## 2021-01-22 LAB — MAGNESIUM: Magnesium: 2.3 mg/dL (ref 1.7–2.4)

## 2021-01-22 MED ORDER — DIPHENOXYLATE-ATROPINE 2.5-0.025 MG PO TABS
2.0000 | ORAL_TABLET | Freq: Three times a day (TID) | ORAL | Status: DC
  Administered 2021-01-22 – 2021-01-24 (×6): 2 via ORAL
  Filled 2021-01-22 (×6): qty 2

## 2021-01-22 MED ORDER — FREE WATER
300.0000 mL | Freq: Four times a day (QID) | Status: DC
  Administered 2021-01-22 – 2021-01-23 (×4): 300 mL

## 2021-01-22 MED ORDER — NEPRO/CARBSTEADY PO LIQD
1000.0000 mL | ORAL | Status: DC
  Administered 2021-01-22 – 2021-01-31 (×3): 1000 mL
  Filled 2021-01-22 (×13): qty 1000

## 2021-01-22 MED ORDER — DIPHENOXYLATE-ATROPINE 2.5-0.025 MG/5ML PO LIQD: 10.0000 mL | Freq: Three times a day (TID) | ORAL | Status: DC

## 2021-01-22 MED ORDER — SCOPOLAMINE 1 MG/3DAYS TD PT72
1.0000 | MEDICATED_PATCH | TRANSDERMAL | Status: DC
  Administered 2021-01-22: 1.5 mg via TRANSDERMAL
  Filled 2021-01-22 (×2): qty 1

## 2021-01-22 NOTE — Progress Notes (Signed)
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   She was started on Remdesivir.  There was initial concern for angioedema.  PCCM was consulted but did not feel that she required intubation.  MRI brain unremarkable for acute change. Hospitalization complicated by continued encephalopathy.   Assessment/Plan: Principal Problem:   Acute metabolic encephalopathy Active Problems:   Insulin dependent type 2 diabetes mellitus (HCC)   Hypertension   History of stroke   CKD (chronic kidney disease), stage IV ([place])   COVID-19 virus infection   Hypernatremia  Improving acute metabolic encephalopathy secondary to AKI Covid hypernatremia -At baseline, patient is conversant per sister.  Able to walk with walker at baseline.  -CT head x2 without acute intracranial abnormality -EEG shows diffuse encephalopathy no epileptiform discharges. -MRI brain without acute intracranial abnormality  -Dr. Curly [facility] on 01/08/21, recommended treating the treatable, hypernatremia, AKI, COVID-19 viral infection. -Cortrak in place for nutrition, failed swallow evaluation. Speech therapist recommended n.p.o. and PEG tube as an alternative to permanent feeding. -Palliative care meeting sought given poor likely prognosis overall this was discussed with family by Dr. [place] Crane-  Acute on chronic anemia of chronic disease Baseline Hg appears to be 9.8 Received 1 unit PRBC on 01/19/2021 Drop of hemoglobin down to 6.7 on 2 05/20/2021. Received another unit of blood on 01/20/2021 to maintain hemoglobin greater than 7.0. FOBT negative. No overt  bleeding.  Anasarca secondary to hypoalbuminemia Albumin 1.9 on 01/20/2021 1 dose of IV Lasix 40 mg daily given on 01/20/2021. Elevate extremities as able.  Severe dysphagia post cortrak placement. Appreciate speech therapist evaluation Cortrack in place HOB 30 degrees, increase free water to 300 every 6  Nonoliguric AKI on CKD stage IV likely prerenal in the setting of dehydration and poor oral intake. -Baseline creatinine 2.6, no evidence of hydronephrosis on renal ultrasound 01/10/2021. Creatinine ended 2.8-2.9 range dropping toda 2.6 L urine output recorded in the last 24 hours.  COVID-19 viral infection -Tested positive on 01/02/21 -Remdesivir x 3 doses -Supportive treatments as ordered, encourage IS, encourage prone positioning, encourage mobilization as able. -Currently on room air with O2 saturation 99%.  Hypernatremia\ Hyperkalemia Free water flushes through tube feeding increased to 300 as above Feeds changed to Neupro which has less potassium-recheck a.m. labs  Possible angioedema -Resolved, patient maintaining airway.  CT neck reassuring without significant swelling  HTN BP is not at goal Increase hydralazine dose to 50 mg 3 times daily. Coreg 6.25 mg twice daily. IV hydralazine as needed with parameters.  Chronic diastolic CHF -EF 37-85%  -Diuretics were held due to rising of creatinine. Continue to monitor strict I's and O's and daily weight. Net I&O 1 L  History of stroke -Diagnosed October 2021, with residual left-sided weakness and expressive aphasia -Continue Plavix, Lipitor via tube feeding -PT has recommended SNF  Resolved fever No fevers recorded in the last 24 hours.  Goals of care Very poor prognosis in the setting of history of stroke, persistent encephalopathy, severe dysphagia, CKD, generalized weakness, recent COVID-19 infection, hypoalbuminemia with anasarca, anemia of chronic disease requiring blood transfusion, and advanced  age. Palliative care consult to assist with establishing goals of care.  DVT prophylaxis:  SQ heparin 3 times daily Place and maintain sequential compression device Start: 01/02/21 2308  Code Status: Full code Family Communication:  Called and updated Sister [name] [phone]   Status is: Inpatient    Dispo: The patient is from: Home.              Anticipated d/c is to: SNF.              Anticipated d/c date is: 01/24/2021.               Patient currently not stable for discharge due to persistent altered mental status and Cortrak dependent.   Difficult to place patient N/A   Subjective  No meaningful interaction Passing multiple stools in the bed likely related to feeds Cannot control secretions Speech therapy note reviewed patient should be n.p.o. per them     Objective: Vitals:   01/22/21 0024 01/22/21 0234 01/22/21 0500 01/22/21 1214  BP: (!) 177/74  (!) 162/71 (!) 134/51  Pulse: 65   69  Resp: 15  18 (!) 22  Temp: (!) 96.6 F (35.9 C) 97.6 F (36.4 C) (!) 97.3 F (36.3 C) 99.9 F (37.7 C)  TempSrc: Axillary Axillary Axillary Axillary  SpO2: 98%  99%   Weight:   79.9 kg   Height:        Intake/Output Summary (Last 24 hours) at 01/22/2021 1245 Last data filed at 01/22/2021 0601 Gross per 24 hour  Intake 2230 ml  Output 1150 ml  Net 1080 ml   Filed Weights   01/20/21 0413 01/21/21 0503 01/22/21 0500  Weight: 85 kg 79.7 kg 79.9 kg    Exam:   Chronically ill-appearing black female no distress thick secretions Dobbhoff tube in place Awakens minimally Chest relatively clear Abdomen soft no rebound No lower extremity edema Foley in place  Data Reviewed: CBC: Recent Labs  Lab 01/18/21 0655 01/19/21 0820 01/20/21 0338 01/20/21 1103 01/20/21 2245 01/21/21 0422 01/22/21 0425  WBC 6.1 4.7 5.1  --   --  4.5 4.2  NEUTROABS 4.6  --  3.7  --   --   --  3.1  HGB 7.2* 7.0* 7.4* 6.7* 8.1* 8.1* 8.0*  HCT 24.9* 23.7* 24.9* 22.8* 27.0* 26.9* 26.5*  MCV  95.4 97.5 95.8  --   --  97.1 95.7  PLT 218 194 175  --   --  173 106*   Basic Metabolic Panel: Recent Labs  Lab 01/18/21 0655 01/19/21 0820 01/20/21 0338 01/21/21 0422 01/22/21 0425  NA 148* 147* 148* 150* 150*  K 4.0 4.7 4.9 5.0 5.4*  CL 123* 120* 120* 119* 121*  CO2 20* 20* 20* 22 23  GLUCOSE 203* 270* 245* 221* 197*  BUN 59* 61* 59* 61* 59*  CREATININE 2.98* 2.98* 2.81* 2.94* 2.67*  CALCIUM 8.2* 8.0* 8.0* 8.6* 8.5*  MG 2.5*  --  2.3  --  2.3  PHOS 3.7  --  3.6  --  3.5   GFR: Estimated Creatinine Clearance: 19.5 mL/min (A) (by C-G formula based on SCr of 2.67 mg/dL (H)). Liver Function Tests: Recent Labs  Lab 01/18/21 0655 01/20/21 0338 01/22/21 0425  AST 38 34 41  ALT 85* 71* 82*  ALKPHOS 105 107 110  BILITOT 0.2* 0.4 0.8  PROT 5.6* 5.3* 5.7*  ALBUMIN 2.2* 1.9* 2.1*   No results for input(s): LIPASE, AMYLASE in the last 168 hours. No results for input(s): AMMONIA in the last 168 hours. Coagulation Profile: No results for input(s): INR,  PROTIME in the last 168 hours. Cardiac Enzymes: No results for input(s): CKTOTAL, CKMB, CKMBINDEX, TROPONINI in the last 168 hours. BNP (last 3 results) No results for input(s): PROBNP in the last 8760 hours. HbA1C: No results for input(s): HGBA1C in the last 72 hours. CBG: Recent Labs  Lab 01/21/21 2014 01/22/21 0011 01/22/21 0503 01/22/21 0901 01/22/21 1211  GLUCAP 105* 184* 172* 227* 207*   Lipid Profile: No results for input(s): CHOL, HDL, LDLCALC, TRIG, CHOLHDL, LDLDIRECT in the last 72 hours. Thyroid Function Tests: No results for input(s): TSH, T4TOTAL, FREET4, T3FREE, THYROIDAB in the last 72 hours. Anemia Panel: Recent Labs    01/21/21 0422  FERRITIN 83  TIBC 267  IRON 103   Urine analysis:    Component Value Date/Time   COLORURINE YELLOW 01/02/2021 2053   APPEARANCEUR CLEAR 01/02/2021 2053   LABSPEC 1.014 01/02/2021 2053   PHURINE 5.0 01/02/2021 2053   GLUCOSEU 50 (A) 01/02/2021 2053   HGBUR  NEGATIVE 01/02/2021 2053   BILIRUBINUR NEGATIVE 01/02/2021 2053   KETONESUR NEGATIVE 01/02/2021 2053   PROTEINUR >=300 (A) 01/02/2021 2053   NITRITE NEGATIVE 01/02/2021 2053   LEUKOCYTESUR NEGATIVE 01/02/2021 2053   Sepsis Labs: @LABRCNTIP (procalcitonin:4,lacticidven:4)  )No results found for this or any previous visit (from the past 240 hour(s)).    Studies: No results found.  Scheduled Meds: . atorvastatin  80 mg Per Tube Daily  . carvedilol  6.25 mg Per NG tube BID WC  . chlorhexidine  15 mL Mouth Rinse BID  . Chlorhexidine Gluconate Cloth  6 each Topical Q0600  . clopidogrel  75 mg Per Tube Daily  . dorzolamide-timolol  1 drop Both Eyes BID  . ferrous sulfate  300 mg Per Tube QODAY  . free water  200 mL Per Tube Q6H  . heparin injection (subcutaneous)  5,000 Units Subcutaneous Q8H  . hydrALAZINE  50 mg Per Tube Q8H  . insulin aspart  0-9 Units Subcutaneous Q4H  . insulin glargine  5 Units Subcutaneous BID  . mouth rinse  15 mL Mouth Rinse q12n4p  . nystatin  5 mL Mouth/Throat QID  . pantoprazole sodium  40 mg Per Tube Daily    Continuous Infusions: . feeding supplement (NEPRO CARB STEADY)       LOS: 19 days   Verneita Griffes, MD [facility] 1:03 PM   If 7PM-7AM, please contact night-coverage [email or web address] Password [email or web address] 01/22/2021, 12:45 PM

## 2021-01-22 NOTE — Progress Notes (Signed)
CRITICAL VALUE ALERT  Critical Value:  K-5.4 from 5.0 yesterday  Date & Time Notied:  01/22/2021 0630H  Provider Notified: Paged/Notified/made aware  Orders Received/Actions taken: Awaiting orders if any

## 2021-01-22 NOTE — Progress Notes (Signed)
Nutrition Follow-up  DOCUMENTATION CODES:   Not applicable  INTERVENTION:   Change to Nepro 1.8 _0 /hr  Free water flushes 241m q6 hours per MD  Regimen provides 2160kcal/day, 97g/day protein, 15g/day fiber and 16740mday free water.   NUTRITION DIAGNOSIS:   Increased nutrient needs related to acute illness (COVID-19 infection) as evidenced by estimated needs.  GOAL:   Patient will meet greater than or equal to 90% of their needs -met with tube feeds   MONITOR:   Diet advancement,Labs,Weight trends,TF tolerance,Skin,I & O's  ASSESSMENT:   [age]ear old female with past medical history significant for previous stroke with residual left-sided weakness 10/21, HTN, breast cancer 2017, expressive aphasia, dementia, CKD stage IV and diabetes who presents from her nursing facility due to altered mental status and was found to have hypernatremia as well as COVID-19.  RD working remotely.  Pt tolerating tube feeds well via NGT. Potassium creeping up; will change tube feeds to Nepro formula as this will provide less potassium. Pt seen by SLP yesterday who continues to recommend NPO and PEG tube. Pt with anasarca; pt received lasix 2/7. Per chart, pt has remained fairly weight stable since admit but is noted to be ~26lbs up from her UBW. Pt +5.6L on her I & O's. UOP 115065mPlan is for SNF at discharge.   Medications reviewed and include: plavix, ferrous sulfate, heparin, insulin, protonix  Labs reviewed: Na 150(H), K 5.4(H), BUN 59(H), creat 2.67(H), P 3.5 wnl, Mg 2.3 wnl Hgb 8.0(L), Hct 26.5(L) cbgs- 184, 172, 227 x 24 hrs  Diet Order:   Diet Order            Diet NPO time specified  Diet effective now                EDUCATION NEEDS:   No education needs have been identified at this time  Skin:  Skin Assessment: Reviewed RN Assessment  Last BM:  2/9- type 7  Height:   Ht Readings from Last 1 Encounters:  01/03/21 _1  (1.702 m)    Weight:   Wt Readings from  Last 1 Encounters:  01/22/21 79.9 kg   BMI:  Body mass index is 27.59 kg/m.  Estimated Nutritional Needs:   Kcal:  1800-2100kcal/day  Protein:  90-105g/day  Fluid:  1.6-1.8L/day  CasKoleen Distance, RD, LDN Please refer to [facility], [facility] RD and/or RD on-call/weekend/after hours pager

## 2021-01-22 NOTE — Evaluation (Signed)
Physical Therapy Evaluation Patient Details Name: Desiree Pollard MRN: [id number] DOB: Feb 20, 1946 Today's Date: 01/22/2021   History of Present Illness  Desiree Pollard is a 75 y.o. female with medical history significant of strokes, DM2, HTN, dementia, CKD4.Per sister: Desiree Pollard with AMS since she visited on 1/17 which is new.  Does have left sided deficits and aphasia at baseline,patient presented 01/02/21 secondary to altered mental status. Also with hypernatremia and COVID-19 infection.   Patient  resides at [facility] term.  Clinical Impression  Patient was evaluated and DC'd from Desiree Pollard 01/07/21 . Patient is now less alert and not moving extremities, requires total care. At this time, Desiree Pollard is not indicated as patient is not participating to achieve functional goals. Desiree Pollard will sign off.  Follow Up Recommendations   no Desiree Pollard indicated-return to SNF   Equipment Recommendations       Recommendations for Other Services       Precautions / Restrictions Precautions Precautions: Fall Precaution Comments: NGfeeding tube      Mobility  Bed Mobility               General bed mobility comments: total assistance for rolling.    Transfers                    Ambulation/Gait                Stairs            Wheelchair Mobility    Modified Rankin (Stroke Patients Only)       Balance                                             Pertinent Vitals/Pain Pain Assessment: Faces Faces Pain Scale: No hurt    Home Living Family/patient expects to be discharged to:: Skilled nursing facility                 Additional Comments: total care    Prior Function                 Hand Dominance        Extremity/Trunk Assessment   Upper Extremity Assessment Upper Extremity Assessment: RUE deficits/detail;LUE deficits/detail RUE Deficits / Details: Desiree Pollard resists PROM, grips hand when touched when placing  mit back on. LUE Deficits / Details:  more falcid, very minimal active movement with PROM    Lower Extremity Assessment Lower Extremity Assessment:  (no moving either leg, no response to DPS.)       Communication      Cognition Arousal/Alertness: Lethargic   Overall Cognitive Status: No family/caregiver present to determine baseline cognitive functioning                                 General Comments: eyes open, no contact, does not blink to threat.      General Comments      Exercises     Assessment/Plan    Desiree Pollard Assessment Patent does not need any further Desiree Pollard services  Desiree Pollard Problem List Decreased cognition;Decreased mobility;Decreased activity tolerance;Impaired tone       Desiree Pollard Treatment Interventions      Desiree Pollard Goals (Current goals can be found in the Care Plan section)  Acute Rehab Desiree Pollard Goals Desiree Pollard Goal Formulation: All assessment and education complete, DC  therapy    Frequency     Barriers to discharge        Co-evaluation               AM-PAC Desiree Pollard "6 Clicks" Mobility  Outcome Measure Help needed turning from your back to your side while in a flat bed without using bedrails?: Total Help needed moving from lying on your back to sitting on the side of a flat bed without using bedrails?: Total Help needed moving to and from a bed to a chair (including a wheelchair)?: Total Help needed standing up from a chair using your arms (e.g., wheelchair or bedside chair)?: Total Help needed to walk in hospital room?: Total Help needed climbing 3-5 steps with a railing? : Total 6 Click Score: 6    End of Session   Activity Tolerance: Patient limited by lethargy Patient left: in bed;with call bell/phone within reach Nurse Communication: Mobility status;Need for lift equipment Desiree Pollard Visit Diagnosis: Other symptoms and signs involving the nervous system (R29.898)    Time: 9747-1855 Desiree Pollard Time Calculation (min) (ACUTE ONLY): 25 min   Charges:   Desiree Pollard Evaluation $Desiree Pollard Re-evaluation: 1 Re-eval Desiree Pollard  Treatments $Therapeutic Activity: 8-22 mins        Tresa Endo Desiree Pollard Acute Rehabilitation Services Pager [phone] Office [phone]   Claretha Cooper 01/22/2021, 12:13 PM

## 2021-01-22 NOTE — Progress Notes (Signed)
OT Cancellation Note  Patient Details Name: Desiree Pollard MRN: [id number] DOB: 10-12-1946   Cancelled Treatment:    Reason Eval/Treat Not Completed: OT screened, no needs identified, will sign off. Per PT patient is total care, not alert enough to follow commands and not purposefully moving any extremity. Patient not appropriate for therapy. OT will sign off.   Abria Vannostrand L Rylinn Linzy 01/22/2021, 1:04 PM

## 2021-01-23 DIAGNOSIS — G9341 Metabolic encephalopathy: Secondary | ICD-10-CM | POA: Diagnosis not present

## 2021-01-23 LAB — CBC WITH DIFFERENTIAL/PLATELET
Abs Immature Granulocytes: 0.05 10*3/uL (ref 0.00–0.07)
Basophils Absolute: 0.1 10*3/uL (ref 0.0–0.1)
Basophils Relative: 1 %
Eosinophils Absolute: 0.1 10*3/uL (ref 0.0–0.5)
Eosinophils Relative: 3 %
HCT: 27.4 % — ABNORMAL LOW (ref 36.0–46.0)
Hemoglobin: 8 g/dL — ABNORMAL LOW (ref 12.0–15.0)
Immature Granulocytes: 1 %
Lymphocytes Relative: 27 %
Lymphs Abs: 1 10*3/uL (ref 0.7–4.0)
MCH: 29 pg (ref 26.0–34.0)
MCHC: 29.2 g/dL — ABNORMAL LOW (ref 30.0–36.0)
MCV: 99.3 fL (ref 80.0–100.0)
Monocytes Absolute: 0.2 10*3/uL (ref 0.1–1.0)
Monocytes Relative: 6 %
Neutro Abs: 2.3 10*3/uL (ref 1.7–7.7)
Neutrophils Relative %: 62 %
Platelets: 151 10*3/uL (ref 150–400)
RBC: 2.76 MIL/uL — ABNORMAL LOW (ref 3.87–5.11)
RDW: 15.4 % (ref 11.5–15.5)
WBC: 3.7 10*3/uL — ABNORMAL LOW (ref 4.0–10.5)
nRBC: 0 % (ref 0.0–0.2)

## 2021-01-23 LAB — GLUCOSE, CAPILLARY
Glucose-Capillary: 103 mg/dL — ABNORMAL HIGH (ref 70–99)
Glucose-Capillary: 107 mg/dL — ABNORMAL HIGH (ref 70–99)
Glucose-Capillary: 126 mg/dL — ABNORMAL HIGH (ref 70–99)
Glucose-Capillary: 115 mg/dL — ABNORMAL HIGH (ref 70–99)
Glucose-Capillary: 135 mg/dL — ABNORMAL HIGH (ref 70–99)

## 2021-01-23 LAB — COMPREHENSIVE METABOLIC PANEL
ALT: 70 U/L — ABNORMAL HIGH (ref 0–44)
AST: 34 U/L (ref 15–41)
Albumin: 2.1 g/dL — ABNORMAL LOW (ref 3.5–5.0)
Alkaline Phosphatase: 106 U/L (ref 38–126)
Anion gap: 9 (ref 5–15)
BUN: 59 mg/dL — ABNORMAL HIGH (ref 8–23)
CO2: 22 mmol/L (ref 22–32)
Calcium: 8.5 mg/dL — ABNORMAL LOW (ref 8.9–10.3)
Chloride: 120 mmol/L — ABNORMAL HIGH (ref 98–111)
Creatinine, Ser: 2.8 mg/dL — ABNORMAL HIGH (ref 0.44–1.00)
GFR, Estimated: 17 mL/min — ABNORMAL LOW (ref >60)
Glucose, Bld: 111 mg/dL — ABNORMAL HIGH (ref 70–99)
Potassium: 5.1 mmol/L (ref 3.5–5.1)
Sodium: 151 mmol/L — ABNORMAL HIGH (ref 135–145)
Total Bilirubin: 0.4 mg/dL (ref 0.3–1.2)
Total Protein: 5.8 g/dL — ABNORMAL LOW (ref 6.5–8.1)

## 2021-01-23 MED ORDER — FREE WATER
300.0000 mL | Status: DC
  Administered 2021-01-23 – 2021-01-31 (×43): 300 mL

## 2021-01-23 NOTE — Progress Notes (Signed)
    Referral received for Desiree Pollard :goals of care discussion. Chart reviewed. Attempted to contact patient's sister, Desiree Pollard as reported she is the point of contact. Unable to reach and voicemail is full.    PMT will re-attempt to contact family at a later time/date. Detailed note and recommendations to follow once GOC has been completed.   Thank you for your referral and allowing PMT to assist in Mrs. Desiree Pollard's care.   Alda Lea, AGPCNP-BC Palliative Medicine Team  Phone: [phone]  NO CHARGE

## 2021-01-23 NOTE — Progress Notes (Signed)
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   She was started on Remdesivir.  There was initial concern for angioedema.  PCCM was consulted but did not feel that she required intubation.  MRI brain unremarkable for acute change. Hospitalization complicated by continued encephalopathy.   Assessment/Plan: Principal Problem:   Acute metabolic encephalopathy Active Problems:   Insulin dependent type 2 diabetes mellitus (HCC)   Hypertension   History of stroke   CKD (chronic kidney disease), stage IV ([place])   COVID-19 virus infection   Hypernatremia  Improving acute metabolic encephalopathy secondary to AKI Covid hypernatremia -At baseline, patient is conversant per sister.  Able to walk with walker at baseline.  -CT head x2 without acute intracranial abnormality -EEG shows diffuse encephalopathy no epileptiform discharges. -MRI brain without acute intracranial abnormality  -Dr. [facility] on 01/08/21, recommended treating the treatable, hypernatremia, AKI, COVID-19 viral infection. -Cortrak in place for nutrition, failed swallow evaluation. Speech recommended n.p.o. and PEG tube as an alternative to permanent feeding. -Palliative care meeting sought given poor likely prognosis overall this was discussed with family by Dr. Phylliss Bob further input from palliative care and goals of care discussions  Acute on chronic anemia of chronic disease Baseline Hg appears to be 9.8 Received 1 unit PRBC on 01/19/2021 Drop of hemoglobin down to 6.7 on 01/20/2021. Received another unit of blood on 01/20/2021 to  maintain hemoglobin greater than 7.0. FOBT negative. No overt bleeding.  Anasarca secondary to hypoalbuminemia Albumin 1.9 on 01/20/2021 1 dose of IV Lasix 40 mg daily given on 01/20/2021. Elevate extremities as able.  Severe dysphagia post cortrak placement. Appreciate speech therapist evaluation Cortrack in place HOB 30 degrees, increase free water to 300 every 6  Nonoliguric AKI on CKD stage IV likely prerenal in the setting of dehydration and poor oral intake. -Baseline creatinine 2.6, no evidence of hydronephrosis on renal ultrasound 01/10/2021. Creatinine ended 2.8-2.9 range dropping toda 2.6 L urine output recorded in the last 24 hours.  COVID-19 viral infection -Tested positive on 01/02/21 -Remdesivir x 3 doses -Supportive treatments as ordered, encourage IS, encourage prone positioning, encourage mobilization as able. -Currently on room air with O2 saturation 99%.  Hypernatremia\ Hyperkalemia Free water flushes increased to 300 as above although no real difference to sodium overnight 2/9 Increased frequency of free water to 300 every 4 instead Feeds changed to Neupro which has less potassium-recheck a.m. labs  Possible angioedema -Resolved, patient maintaining airway.  CT neck reassuring without significant swelling  HTN BP is not at goal Increase hydralazine dose to 50 mg 3 times daily. Coreg 6.25 mg twice daily. IV hydralazine as needed with parameters.  Chronic diastolic CHF -EF 76-73%  -Diuretics were held due to rising of creatinine. Continue to monitor strict I's and O's and daily weight. Net I&O 1 L  History of stroke -Diagnosed October 2021, with residual left-sided weakness and expressive aphasia -Continue Plavix, Lipitor via tube feeding -PT has recommended SNF  Resolved fever No fevers recorded in the last 24 hours.  Goals of care Very poor prognosis in the setting of history of stroke, persistent encephalopathy, severe dysphagia, CKD,  generalized weakness, recent COVID-19 infection, hypoalbuminemia with anasarca,  anemia of chronic disease requiring blood transfusion, and advanced age. Palliative care consult to assist with establishing goals of care.  DVT prophylaxis:  SQ heparin 3 times daily Place and maintain sequential compression device Start: 01/02/21 2308 Code Status: Full code Family Communication:  Called and updated Sister [name] [phone] 01/23/2021 and updated fully SHE wants to give a chance for recovery Have encouraged her to think carefully over several days about next steps as patient might not do well at home [no facilty as yet identified that can manager her needs and might have to d/c home]   Status is: Inpatient    Dispo: The patient is from: Home.              Anticipated d/c is to: SNF.              Anticipated d/c date is: 01/24/2021.               Patient currently not stable for discharge due to persistent altered mental status and Cortrak dependent.   Difficult to place patient N/A   Subjective  Improvement in interaction but not really doing a whole lot No chest pain NG tube remains in place Family was at bedside [facility] for the day Overall she seems about the same No reported diarrhea since initiation of Lomotil 2/9     Objective: Vitals:   01/23/21 0330 01/23/21 0500 01/23/21 0948 01/23/21 1315  BP: (!) 149/60  (!) 163/54   Pulse:   71   Resp:   13   Temp: 99.1 F (37.3 C)  (!) 97.3 F (36.3 C) 97.8 F (36.6 C)  TempSrc: Axillary  Axillary Axillary  SpO2: 99%  100%   Weight:  81 kg    Height:        Intake/Output Summary (Last 24 hours) at 01/23/2021 1351 Last data filed at 01/23/2021 0520 Gross per 24 hour  Intake 1760.84 ml  Output 700 ml  Net 1060.84 ml   Filed Weights   01/21/21 0503 01/22/21 0500 01/23/21 0500  Weight: 79.7 kg 79.9 kg 81 kg    Exam:   Chronically ill-appearing black female no distress thick secretions Dobbhoff tube in  place Awakens minimally Chest relatively clear Abdomen soft no rebound No lower extremity edema Foley in place  Data Reviewed: CBC: Recent Labs  Lab 01/18/21 0655 01/19/21 0820 01/20/21 0338 01/20/21 1103 01/20/21 2245 01/21/21 0422 01/22/21 0425 01/23/21 0425  WBC 6.1 4.7 5.1  --   --  4.5 4.2 3.7*  NEUTROABS 4.6  --  3.7  --   --   --  3.1 2.3  HGB 7.2* 7.0* 7.4* 6.7* 8.1* 8.1* 8.0* 8.0*  HCT 24.9* 23.7* 24.9* 22.8* 27.0* 26.9* 26.5* 27.4*  MCV 95.4 97.5 95.8  --   --  97.1 95.7 99.3  PLT 218 194 175  --   --  173 129* 856   Basic Metabolic Panel: Recent Labs  Lab 01/18/21 0655 01/19/21 0820 01/20/21 0338 01/21/21 0422 01/22/21 0425 01/23/21 0425  NA 148* 147* 148* 150* 150* 151*  K 4.0 4.7 4.9 5.0 5.4* 5.1  CL 123* 120* 120* 119* 121* 120*  CO2 20* 20* 20* 22 23 22   GLUCOSE 203* 270* 245* 221* 197* 111*  BUN 59* 61* 59* 61* 59* 59*  CREATININE 2.98* 2.98* 2.81* 2.94* 2.67* 2.80*  CALCIUM 8.2* 8.0* 8.0* 8.6* 8.5* 8.5*  MG 2.5*  --  2.3  --  2.3  --   PHOS 3.7  --  3.6  --  3.5  --    GFR: Estimated Creatinine Clearance: 18.7 mL/min (A) (by C-G formula based on SCr of 2.8 mg/dL (H)). Liver Function Tests: Recent Labs  Lab 01/18/21 0655 01/20/21 0338 01/22/21 0425 01/23/21 0425  AST 38 34 41 34  ALT 85* 71* 82* 70*  ALKPHOS 105 107 110 106  BILITOT 0.2* 0.4 0.8 0.4  PROT 5.6* 5.3* 5.7* 5.8*  ALBUMIN 2.2* 1.9* 2.1* 2.1*   No results for input(s): LIPASE, AMYLASE in the last 168 hours. No results for input(s): AMMONIA in the last 168 hours. Coagulation Profile: No results for input(s): INR, PROTIME in the last 168 hours. Cardiac Enzymes: No results for input(s): CKTOTAL, CKMB, CKMBINDEX, TROPONINI in the last 168 hours. BNP (last 3 results) No results for input(s): PROBNP in the last 8760 hours. HbA1C: No results for input(s): HGBA1C in the last 72 hours. CBG: Recent Labs  Lab 01/22/21 2042 01/22/21 2358 01/23/21 0327 01/23/21 0754  01/23/21 1145  GLUCAP 84 87 103* 107* 126*   Lipid Profile: No results for input(s): CHOL, HDL, LDLCALC, TRIG, CHOLHDL, LDLDIRECT in the last 72 hours. Thyroid Function Tests: No results for input(s): TSH, T4TOTAL, FREET4, T3FREE, THYROIDAB in the last 72 hours. Anemia Panel: Recent Labs    01/21/21 0422  FERRITIN 83  TIBC 267  IRON 103   Urine analysis:    Component Value Date/Time   COLORURINE YELLOW 01/02/2021 2053   APPEARANCEUR CLEAR 01/02/2021 2053   LABSPEC 1.014 01/02/2021 2053   PHURINE 5.0 01/02/2021 2053   GLUCOSEU 50 (A) 01/02/2021 2053   HGBUR NEGATIVE 01/02/2021 2053   BILIRUBINUR NEGATIVE 01/02/2021 2053   KETONESUR NEGATIVE 01/02/2021 2053   PROTEINUR >=300 (A) 01/02/2021 2053   NITRITE NEGATIVE 01/02/2021 2053   LEUKOCYTESUR NEGATIVE 01/02/2021 2053   Sepsis Labs: @LABRCNTIP (procalcitonin:4,lacticidven:4)  )No results found for this or any previous visit (from the past 240 hour(s)).    Studies: No results found.  Scheduled Meds: . atorvastatin  80 mg Per Tube Daily  . carvedilol  6.25 mg Per NG tube BID WC  . chlorhexidine  15 mL Mouth Rinse BID  . Chlorhexidine Gluconate Cloth  6 each Topical Q0600  . clopidogrel  75 mg Per Tube Daily  . diphenoxylate-atropine  2 tablet Oral Q8H  . dorzolamide-timolol  1 drop Both Eyes BID  . ferrous sulfate  300 mg Per Tube QODAY  . free water  300 mL Per Tube Q6H  . heparin injection (subcutaneous)  5,000 Units Subcutaneous Q8H  . hydrALAZINE  50 mg Per Tube Q8H  . insulin aspart  0-9 Units Subcutaneous Q4H  . insulin glargine  5 Units Subcutaneous BID  . mouth rinse  15 mL Mouth Rinse q12n4p  . nystatin  5 mL Mouth/Throat QID  . pantoprazole sodium  40 mg Per Tube Daily  . scopolamine  1 patch Transdermal Q72H    Continuous Infusions: . feeding supplement (NEPRO CARB STEADY) 1,000 mL (01/22/21 1832)     LOS: 20 days   Verneita Griffes, MD [facility] 1:51 PM   If 7PM-7AM, please contact  night-coverage [email or web address] Password [email or web address] 01/23/2021, 1:51 PM

## 2021-01-23 NOTE — Plan of Care (Signed)
  Problem: Clinical Measurements: Goal: Ability to maintain clinical measurements within normal limits will improve Outcome: Progressing Goal: Respiratory complications will improve Outcome: Progressing   Problem: Elimination: Goal: Will not experience complications related to bowel motility Outcome: Progressing   Problem: Safety: Goal: Ability to remain free from injury will improve Outcome: Progressing

## 2021-01-24 ENCOUNTER — Inpatient Hospital Stay (HOSPITAL_COMMUNITY): Payer: Medicare HMO

## 2021-01-24 DIAGNOSIS — N179 Acute kidney failure, unspecified: Secondary | ICD-10-CM

## 2021-01-24 DIAGNOSIS — R5383 Other fatigue: Secondary | ICD-10-CM

## 2021-01-24 DIAGNOSIS — G9341 Metabolic encephalopathy: Secondary | ICD-10-CM | POA: Diagnosis not present

## 2021-01-24 DIAGNOSIS — Z7189 Other specified counseling: Secondary | ICD-10-CM

## 2021-01-24 DIAGNOSIS — R4701 Aphasia: Secondary | ICD-10-CM

## 2021-01-24 DIAGNOSIS — Z515 Encounter for palliative care: Secondary | ICD-10-CM

## 2021-01-24 DIAGNOSIS — U071 COVID-19: Secondary | ICD-10-CM | POA: Diagnosis not present

## 2021-01-24 LAB — CBC WITH DIFFERENTIAL/PLATELET
Abs Immature Granulocytes: 0.11 10*3/uL — ABNORMAL HIGH (ref 0.00–0.07)
Basophils Absolute: 0.1 10*3/uL (ref 0.0–0.1)
Basophils Relative: 2 %
Eosinophils Absolute: 0.1 10*3/uL (ref 0.0–0.5)
Eosinophils Relative: 4 %
HCT: 28.3 % — ABNORMAL LOW (ref 36.0–46.0)
Hemoglobin: 8.2 g/dL — ABNORMAL LOW (ref 12.0–15.0)
Immature Granulocytes: 3 %
Lymphocytes Relative: 15 %
Lymphs Abs: 0.6 10*3/uL — ABNORMAL LOW (ref 0.7–4.0)
MCH: 28.9 pg (ref 26.0–34.0)
MCHC: 29 g/dL — ABNORMAL LOW (ref 30.0–36.0)
MCV: 99.6 fL (ref 80.0–100.0)
Monocytes Absolute: 0.2 10*3/uL (ref 0.1–1.0)
Monocytes Relative: 6 %
Neutro Abs: 2.6 10*3/uL (ref 1.7–7.7)
Neutrophils Relative %: 70 %
Platelets: 129 10*3/uL — ABNORMAL LOW (ref 150–400)
RBC: 2.84 MIL/uL — ABNORMAL LOW (ref 3.87–5.11)
RDW: 15.2 % (ref 11.5–15.5)
WBC: 3.6 10*3/uL — ABNORMAL LOW (ref 4.0–10.5)
nRBC: 0 % (ref 0.0–0.2)

## 2021-01-24 LAB — COMPREHENSIVE METABOLIC PANEL
ALT: 77 U/L — ABNORMAL HIGH (ref 0–44)
AST: 47 U/L — ABNORMAL HIGH (ref 15–41)
Albumin: 2.2 g/dL — ABNORMAL LOW (ref 3.5–5.0)
Alkaline Phosphatase: 121 U/L (ref 38–126)
Anion gap: 7 (ref 5–15)
BUN: 60 mg/dL — ABNORMAL HIGH (ref 8–23)
CO2: 22 mmol/L (ref 22–32)
Calcium: 8.9 mg/dL (ref 8.9–10.3)
Chloride: 120 mmol/L — ABNORMAL HIGH (ref 98–111)
Creatinine, Ser: 2.63 mg/dL — ABNORMAL HIGH (ref 0.44–1.00)
GFR, Estimated: 18 mL/min — ABNORMAL LOW (ref >60)
Glucose, Bld: 135 mg/dL — ABNORMAL HIGH (ref 70–99)
Potassium: 5 mmol/L (ref 3.5–5.1)
Sodium: 149 mmol/L — ABNORMAL HIGH (ref 135–145)
Total Bilirubin: 0.5 mg/dL (ref 0.3–1.2)
Total Protein: 6.2 g/dL — ABNORMAL LOW (ref 6.5–8.1)

## 2021-01-24 LAB — GLUCOSE, CAPILLARY
Glucose-Capillary: 110 mg/dL — ABNORMAL HIGH (ref 70–99)
Glucose-Capillary: 122 mg/dL — ABNORMAL HIGH (ref 70–99)
Glucose-Capillary: 126 mg/dL — ABNORMAL HIGH (ref 70–99)
Glucose-Capillary: 130 mg/dL — ABNORMAL HIGH (ref 70–99)
Glucose-Capillary: 137 mg/dL — ABNORMAL HIGH (ref 70–99)

## 2021-01-24 IMAGING — DX DG ABD PORTABLE 1V
2 series · 2 of 2 positions shown · non-contrast
Comparison: [DATE]

CLINICAL DATA: Feeding tube placement

EXAM:
PORTABLE ABDOMEN - 1 VIEW

[abdomen kub (1 of 2)]
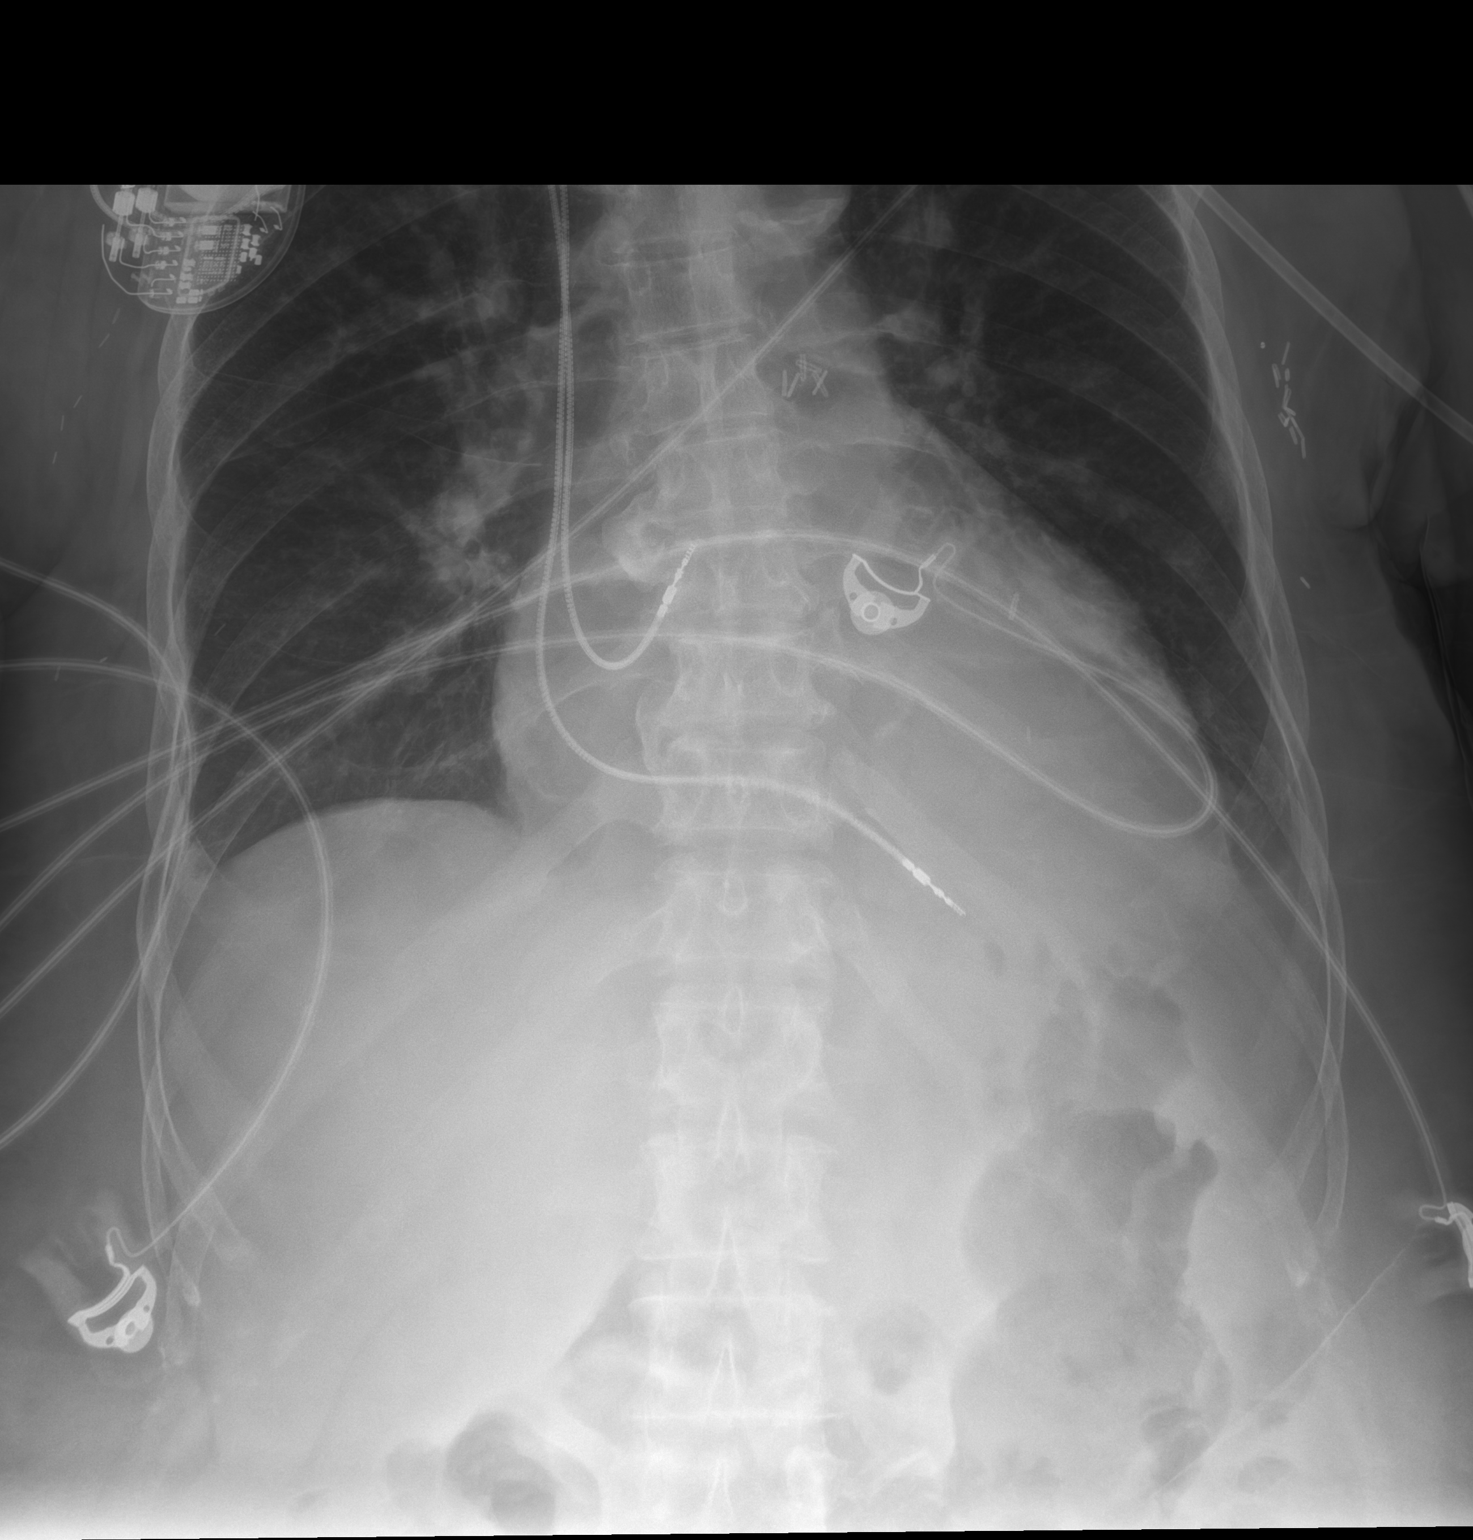

[abdomen kub (2 of 2)]
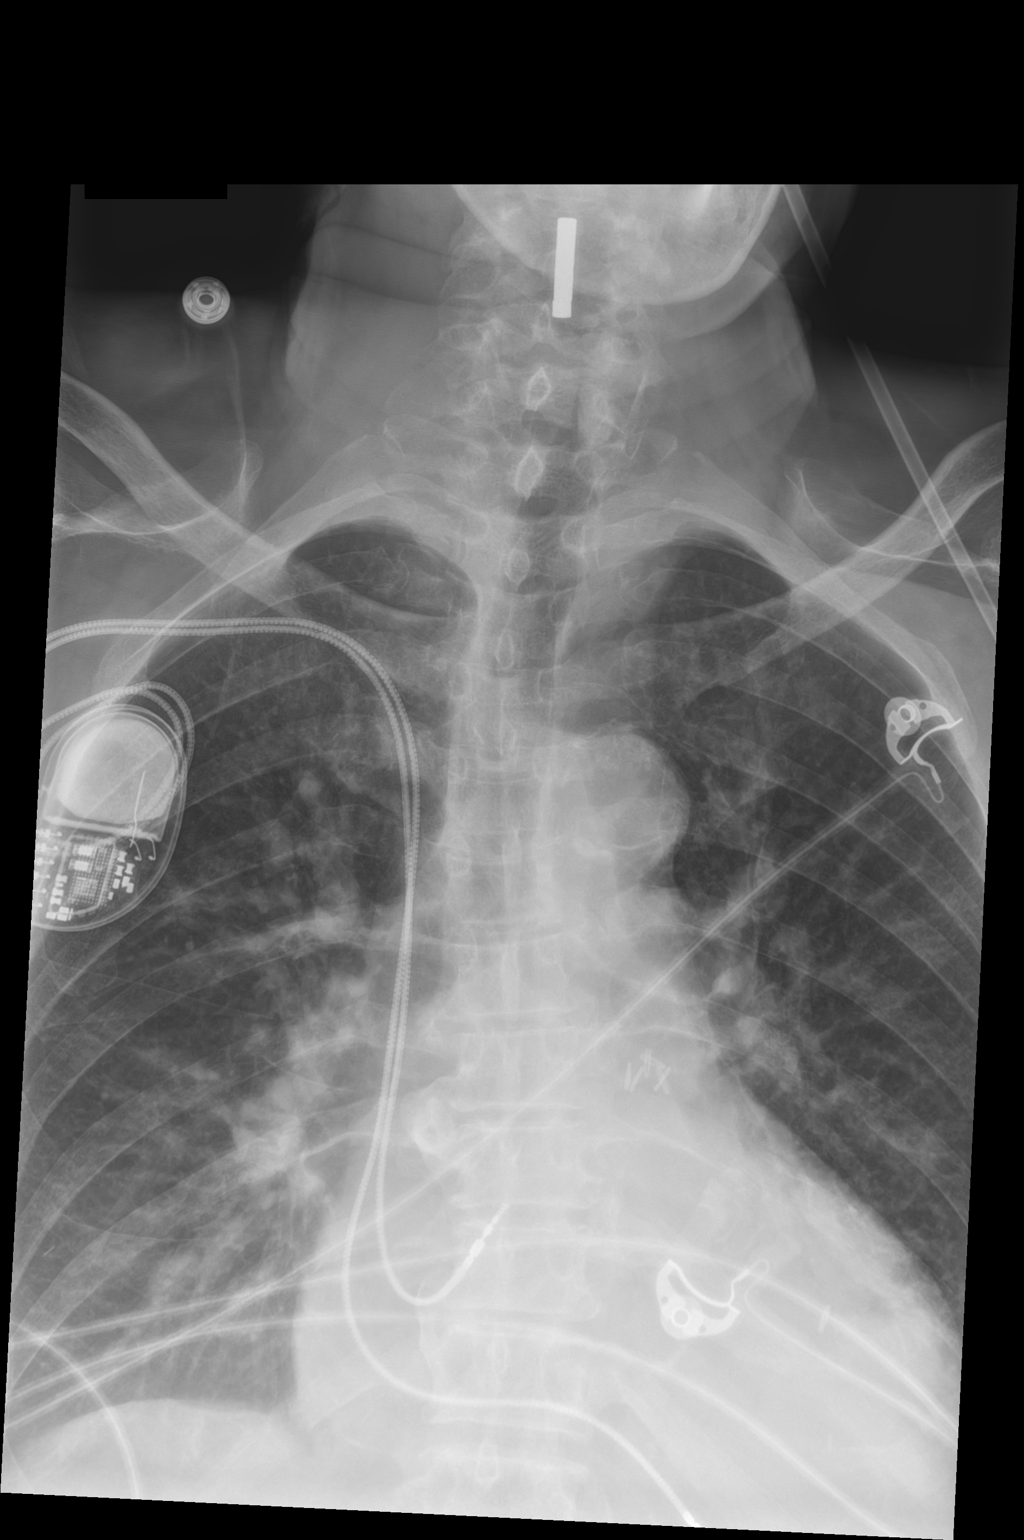

[2 of 2 positions shown; findings below may reference images not displayed]

FINDINGS: Feeding tube tip is at the level of the pharynx. No evident bowel
dilatation or air-fluid level to suggest bowel obstruction. No free
air. Consolidation left lower lobe noted.
IMPRESSION: 1.  Feeding tube tip in pharynx.

2.  No bowel obstruction or free air evident.

3.  Consolidation left lung base.

These results will be called to the ordering clinician or
representative by the Radiologist Assistant, and communication
documented in the PACS or [REDACTED].

## 2021-01-24 IMAGING — DX DG ABDOMEN 1V
1 series · 1 of 1 positions shown · non-contrast
Comparison: [DATE] study obtained earlier in the day

CLINICAL DATA: Feeding tube placement

EXAM:
ABDOMEN - 1 VIEW

[abdomen kub]
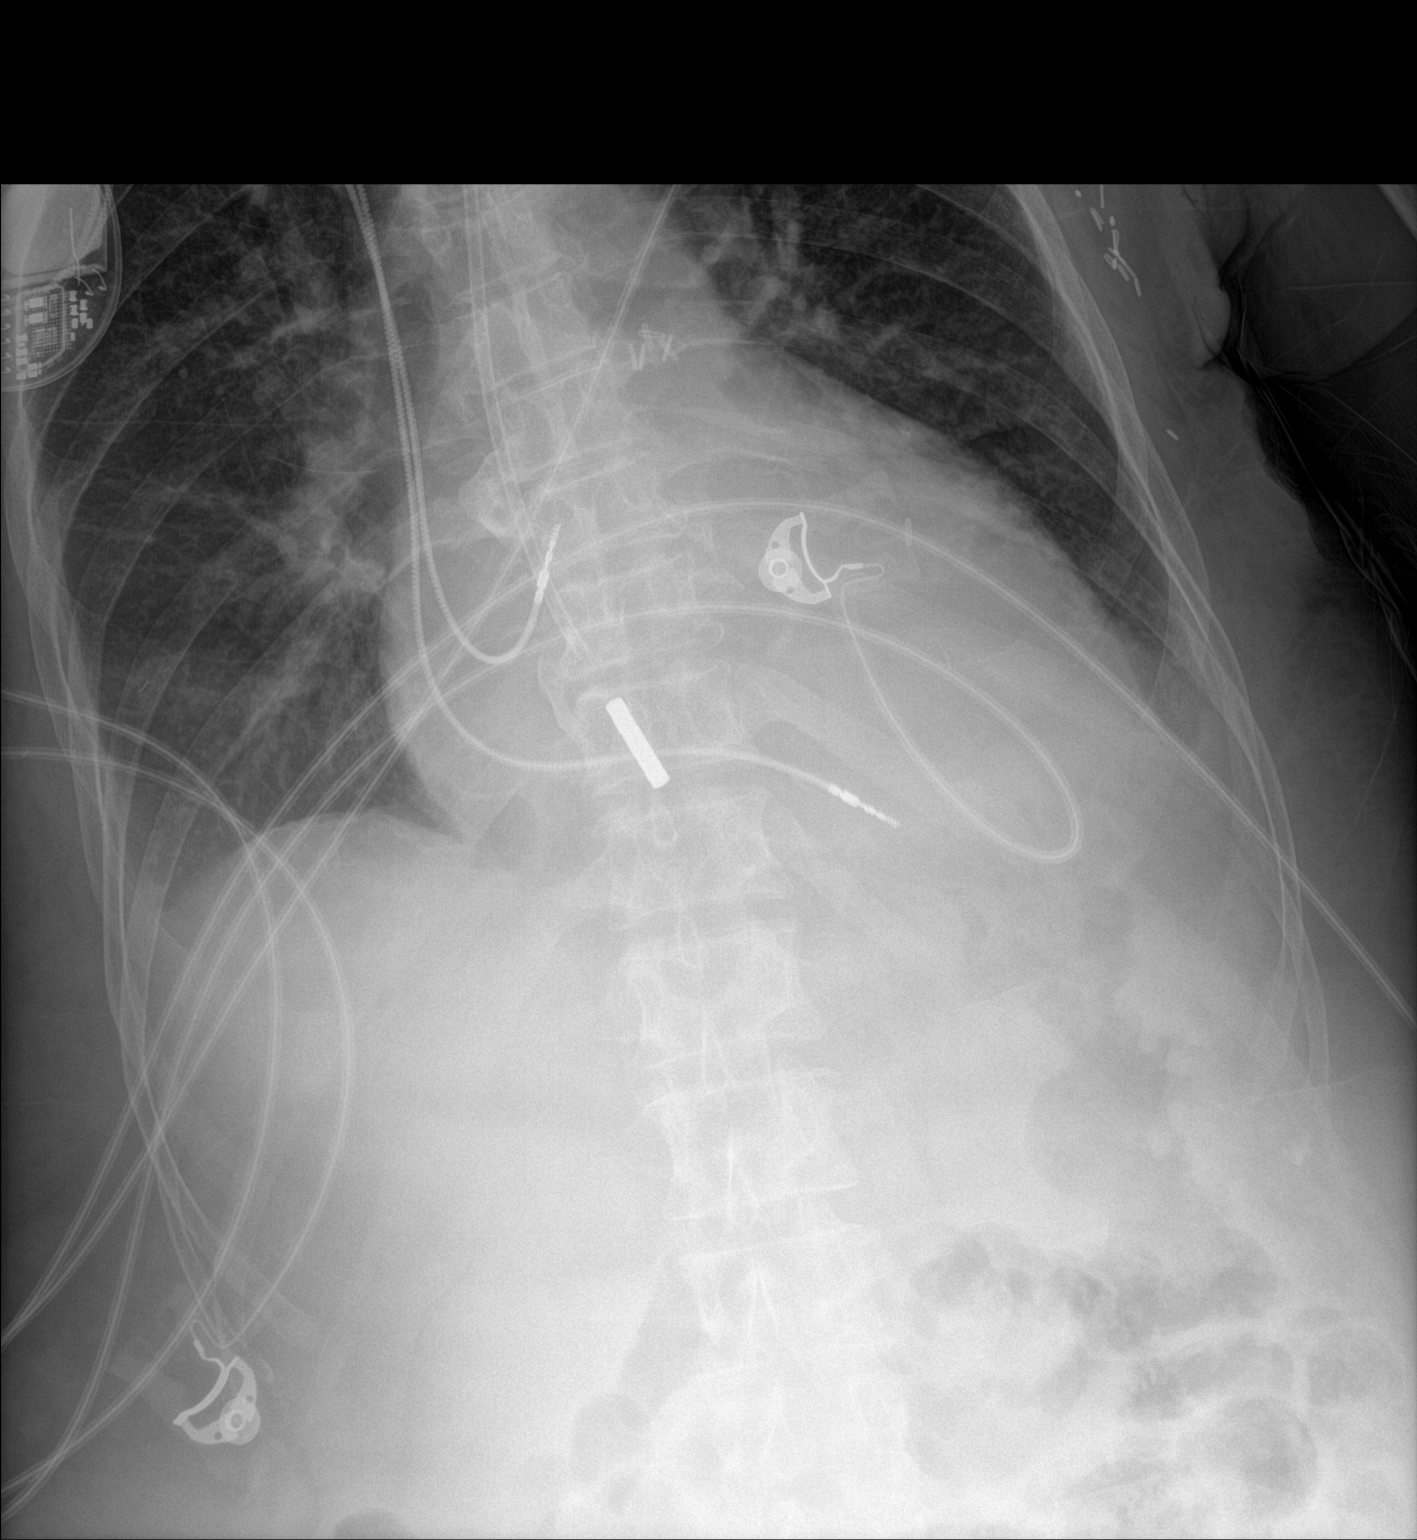

[1 of 1 positions shown; findings below may reference images not displayed]

FINDINGS: Feeding tube tip is in the distal esophagus with the tip directed
toward the gastroesophageal junction. There is ill-defined airspace
opacity in the left lower lobe region. Lungs elsewhere are clear.
Heart is mildly enlarged with pulmonary vascular normal. Pacemaker
leads are attached the right atrium and right ventricle. There is
aortic atherosclerosis. There are surgical clips in the left lower
chest region.
IMPRESSION: Feeding tube tip in distal esophagus with tip directed toward
gastroesophageal junction. Persistent opacity left lower lobe,
unchanged from earlier in the day. Stable cardiac prominence.
Pacemaker leads attached to right atrium and right ventricle. Aortic
Atherosclerosis ([HU]-[HU]).

These results will be called to the ordering clinician or
representative by the Radiologist Assistant, and communication
documented in the PACS or [REDACTED].

## 2021-01-24 IMAGING — DX DG ABD PORTABLE 1V
1 series · 1 of 1 positions shown · non-contrast
Comparison: [DATE]

CLINICAL DATA: Check feeding catheter placement

EXAM:
PORTABLE ABDOMEN - 1 VIEW

[abdomen kub]
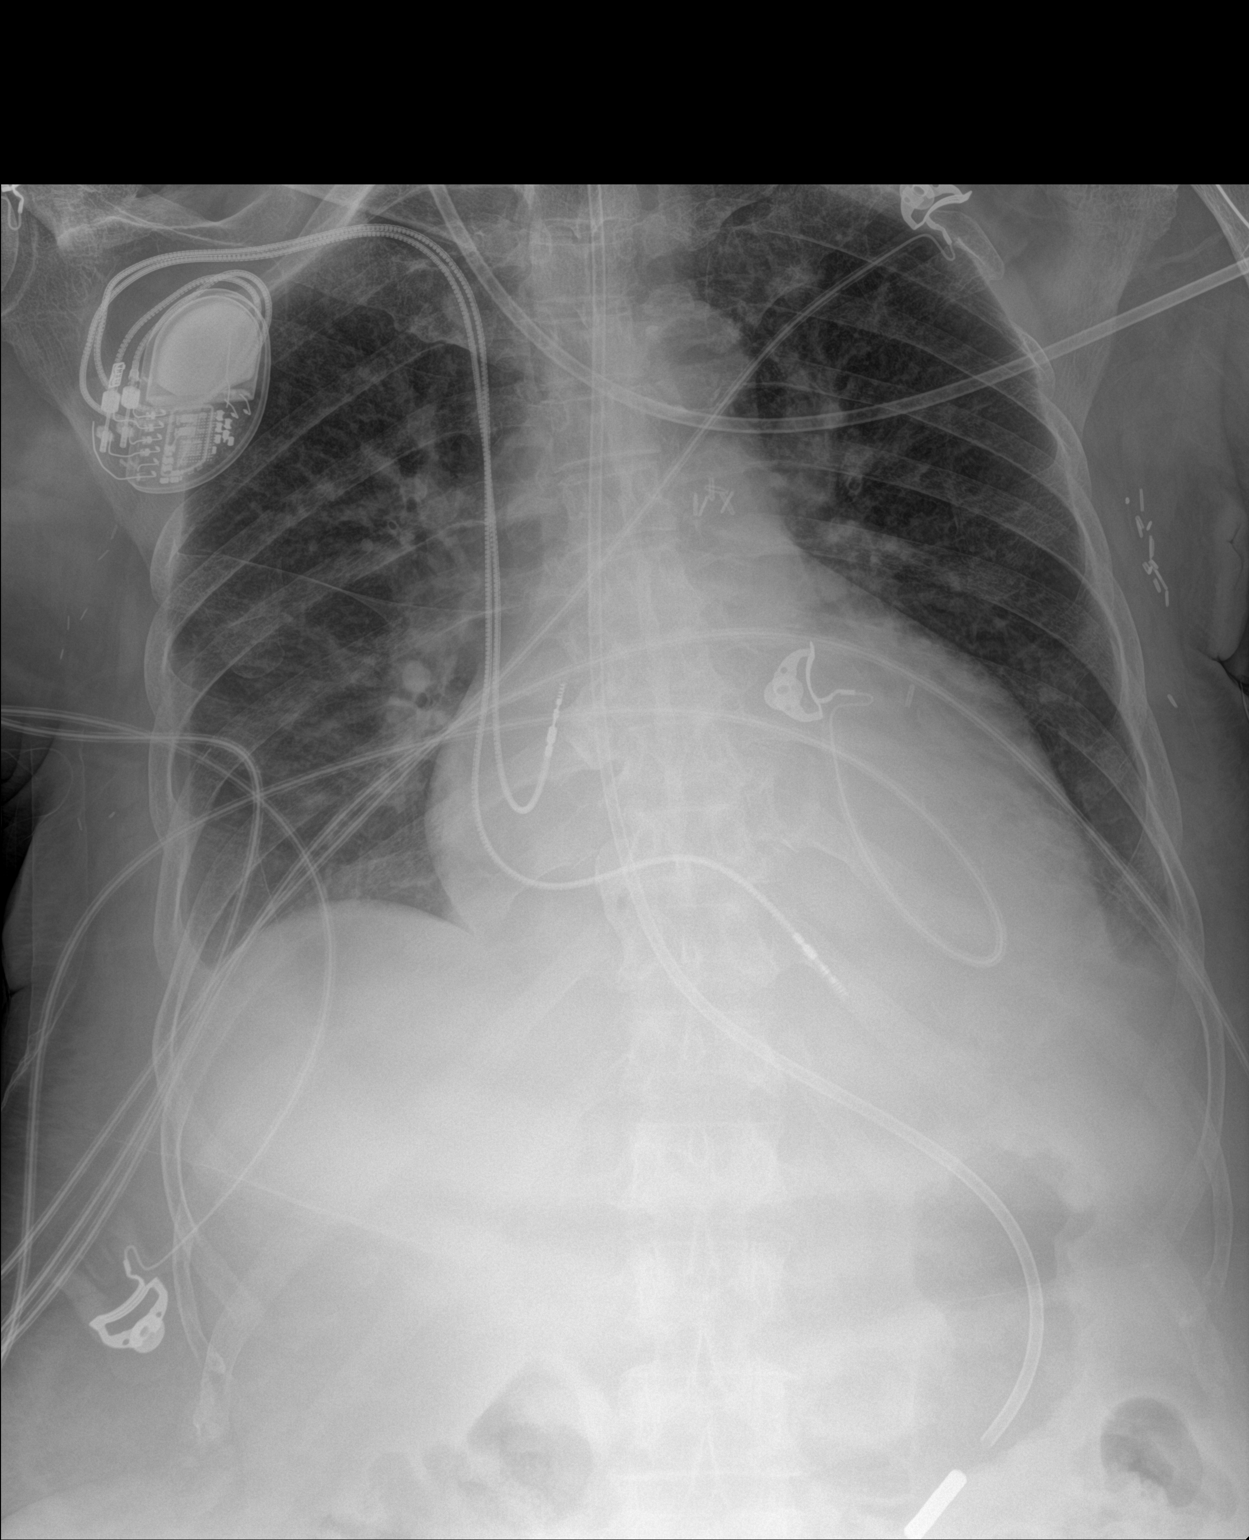

[1 of 1 positions shown; findings below may reference images not displayed]

FINDINGS: Feeding catheter is noted extending into the stomach. The weighted
portion is noted in the mid stomach directed towards the gastric
antrum. No free air is noted.
IMPRESSION: Weighted feeding catheter in the midportion of the stomach.

## 2021-01-24 NOTE — Progress Notes (Signed)
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   She was started on Remdesivir.  There was initial concern for angioedema.  PCCM was consulted but did not feel that she required intubation.  MRI brain unremarkable for acute change. Hospitalization complicated by continued encephalopathy.   Assessment/Plan: Principal Problem:   Acute metabolic encephalopathy Active Problems:   Insulin dependent type 2 diabetes mellitus (HCC)   Hypertension   History of stroke   CKD (chronic kidney disease), stage IV ([place])   COVID-19 virus infection   Hypernatremia  Improving acute metabolic encephalopathy secondary to AKI Covid hypernatremia -At baseline, patient is conversant per sister.  Able to walk with walker at baseline.  -CT head x2 without acute intracranial abnormality -EEG shows diffuse encephalopathy no epileptiform discharges. -MRI brain without acute intracranial abnormality  -Dr. [facility] on 01/08/21, recommended treating the treatable, hypernatremia, AKI, COVID-19 viral infection. -Cortrak in place for nutrition, failed swallow evaluation. Speech recommended n.p.o. and PEG tube as an alternative to permanent feeding. -Palliative care meeting sought given poor likely prognosis overall this was discussed with family by Dr. Phylliss Bob further input from palliative care and goals of care discussions as she is clearly not doing well  Acute on chronic anemia of chronic disease Baseline Hg appears to be 9.8 Received 1 unit PRBC on 01/19/2021 Drop of hemoglobin down to 6.7 on 01/20/2021. Received another  unit of blood on 01/20/2021 to maintain hemoglobin greater than 7.0. FOBT negative. No overt bleeding.  Anasarca secondary to hypoalbuminemia Albumin 1.9 on 01/20/2021 1 dose of IV Lasix 40 mg daily given on 01/20/2021. Elevate extremities as able.  Severe dysphagia post cortrak placement. Appreciate speech therapist evaluation Cortrack in place HOB 30 degrees, increase free water to 300 every 4 hours given hypernatremia  Nonoliguric AKI on CKD stage IV likely prerenal in the setting of dehydration and poor oral intake. -Baseline creatinine 2.6, no evidence of hydronephrosis on renal ultrasound 01/10/2021. Creatinine trending 2.8-2.9 range currently 2.6 Net +3 L  COVID-19 viral infection -Tested positive on 01/02/21 -Remdesivir x 3 doses -Supportive treatments as ordered, encourage IS, encourage prone positioning, encourage mobilization as able. -Currently on room air with O2 saturation 99%.  Hypernatremia\ Hyperkalemia Free water flushes increased to 300 as above although no real difference to sodium overnight 2/9 Increased frequency of free water to 300 every 4 instead Feeds changed to Nepro which has less potassium-recheck a.m. labs  Possible angioedema -Resolved, patient maintaining airway.  CT neck reassuring without significant swelling  HTN BP is not at goal Increase hydralazine dose to 50 mg 3 times daily. Coreg 6.25 mg twice daily. IV hydralazine as needed with parameters.  Chronic diastolic CHF -EF 17-51%  -Diuretics were held due to rising of creatinine. Continue to monitor strict I's and O's and daily weight. Net I&O 1 L  History of stroke -Diagnosed October 2021, with residual left-sided weakness and expressive aphasia -Continue Plavix, Lipitor via tube feeding -PT has recommended SNF  Resolved fever No fevers recorded in the last 24 hours.  Goals of care Very poor prognosis in the setting of history of stroke, persistent encephalopathy, severe  dysphagia, CKD, generalized weakness, recent COVID-19 infection,  hypoalbuminemia with anasarca, anemia of chronic disease requiring blood transfusion, and advanced age. Palliative care consult to assist with establishing goals of care.  DVT prophylaxis:  SQ heparin 3 times daily Place and maintain sequential compression device Start: 01/02/21 2308 Code Status: Full code Family Communication:  Called and updated Sister [name] [phone] 01/23/2021 and updated fully SHE wants to give a chance for recovery Have encouraged her to think carefully over several days about next steps as patient might not do well at home [no facilty as yet identified that can manager her needs and might have to d/c home]   Status is: Inpatient    Dispo: The patient is from: Home.              Anticipated d/c is to: SNF.              Anticipated d/c date is: 01/24/2021.               Patient currently not stable for discharge due to persistent altered mental status and Cortrak dependent.   Difficult to place patient N/A   Subjective  Improvement in interaction but still pretty noninteractive feeding tube problems today 2/11 which resolved with repositioning tube x-ray shows it is in place     Objective: Vitals:   01/23/21 1900 01/24/21 0500 01/24/21 0610 01/24/21 0859  BP: (!) 157/68  (!) 164/63 (!) 150/44  Pulse: 62  60 61  Resp: 15  16   Temp: (!) 97.2 F (36.2 C)  (!) 96.5 F (35.8 C) (!) 96.3 F (35.7 C)  TempSrc: Axillary  Rectal Axillary  SpO2: 99%  99% 99%  Weight:  83.1 kg 81.7 kg   Height:        Intake/Output Summary (Last 24 hours) at 01/24/2021 1302 Last data filed at 01/24/2021 8841 Gross per 24 hour  Intake 750 ml  Output 1150 ml  Net -400 ml   Filed Weights   01/23/21 0500 01/24/21 0500 01/24/21 0610  Weight: 81 kg 83.1 kg 81.7 kg    Exam:   Chronically ill-appearing black female no distress-secretions about the same Dobbhoff tube in place no new issues Awakens  interacts to some degree very muffled voice Chest relatively clear Abdomen soft no rebound No lower extremity edema Foley in place  Data Reviewed: CBC: Recent Labs  Lab 01/18/21 0655 01/19/21 0820 01/20/21 0338 01/20/21 1103 01/20/21 2245 01/21/21 0422 01/22/21 0425 01/23/21 0425 01/24/21 0420  WBC 6.1   < > 5.1  --   --  4.5 4.2 3.7* 3.6*  NEUTROABS 4.6  --  3.7  --   --   --  3.1 2.3 2.6  HGB 7.2*   < > 7.4*   < > 8.1* 8.1* 8.0* 8.0* 8.2*  HCT 24.9*   < > 24.9*   < > 27.0* 26.9* 26.5* 27.4* 28.3*  MCV 95.4   < > 95.8  --   --  97.1 95.7 99.3 99.6  PLT 218   < > 175  --   --  173 129* 151 129*   < > = values in this interval not displayed.   Basic Metabolic Panel: Recent Labs  Lab 01/18/21 0655 01/19/21 0820 01/20/21 0338 01/21/21 0422 01/22/21 0425 01/23/21 0425 01/24/21 0420  NA 148*   < > 148* 150* 150* 151* 149*  K 4.0   < > 4.9 5.0 5.4* 5.1 5.0  CL 123*   < > 120* 119* 121* 120* 120*  CO2 20*   < >  20* 22 23 22 22   GLUCOSE 203*   < > 245* 221* 197* 111* 135*  BUN 59*   < > 59* 61* 59* 59* 60*  CREATININE 2.98*   < > 2.81* 2.94* 2.67* 2.80* 2.63*  CALCIUM 8.2*   < > 8.0* 8.6* 8.5* 8.5* 8.9  MG 2.5*  --  2.3  --  2.3  --   --   PHOS 3.7  --  3.6  --  3.5  --   --    < > = values in this interval not displayed.   GFR: Estimated Creatinine Clearance: 20 mL/min (A) (by C-G formula based on SCr of 2.63 mg/dL (H)). Liver Function Tests: Recent Labs  Lab 01/18/21 0655 01/20/21 0338 01/22/21 0425 01/23/21 0425 01/24/21 0420  AST 38 34 41 34 47*  ALT 85* 71* 82* 70* 77*  ALKPHOS 105 107 110 106 121  BILITOT 0.2* 0.4 0.8 0.4 0.5  PROT 5.6* 5.3* 5.7* 5.8* 6.2*  ALBUMIN 2.2* 1.9* 2.1* 2.1* 2.2*   No results for input(s): LIPASE, AMYLASE in the last 168 hours. No results for input(s): AMMONIA in the last 168 hours. Coagulation Profile: No results for input(s): INR, PROTIME in the last 168 hours. Cardiac Enzymes: No results for input(s): CKTOTAL, CKMB,  CKMBINDEX, TROPONINI in the last 168 hours. BNP (last 3 results) No results for input(s): PROBNP in the last 8760 hours. HbA1C: No results for input(s): HGBA1C in the last 72 hours. CBG: Recent Labs  Lab 01/23/21 1636 01/23/21 2043 01/24/21 0006 01/24/21 0745 01/24/21 1206  GLUCAP 115* 135* 130* 110* 122*   Lipid Profile: No results for input(s): CHOL, HDL, LDLCALC, TRIG, CHOLHDL, LDLDIRECT in the last 72 hours. Thyroid Function Tests: No results for input(s): TSH, T4TOTAL, FREET4, T3FREE, THYROIDAB in the last 72 hours. Anemia Panel: No results for input(s): VITAMINB12, FOLATE, FERRITIN, TIBC, IRON, RETICCTPCT in the last 72 hours. Urine analysis:    Component Value Date/Time   COLORURINE YELLOW 01/02/2021 2053   APPEARANCEUR CLEAR 01/02/2021 2053   LABSPEC 1.014 01/02/2021 2053   PHURINE 5.0 01/02/2021 2053   GLUCOSEU 50 (A) 01/02/2021 2053   HGBUR NEGATIVE 01/02/2021 2053   BILIRUBINUR NEGATIVE 01/02/2021 2053   KETONESUR NEGATIVE 01/02/2021 2053   PROTEINUR >=300 (A) 01/02/2021 2053   NITRITE NEGATIVE 01/02/2021 2053   LEUKOCYTESUR NEGATIVE 01/02/2021 2053   Sepsis Labs: @LABRCNTIP (procalcitonin:4,lacticidven:4)  )No results found for this or any previous visit (from the past 240 hour(s)).    Studies: DG Abd 1 View  Result Date: 01/24/2021 CLINICAL DATA:  Feeding tube placement EXAM: ABDOMEN - 1 VIEW COMPARISON:  January 24, 2021 study obtained earlier in the day FINDINGS: Feeding tube tip is in the distal esophagus with the tip directed toward the gastroesophageal junction. There is ill-defined airspace opacity in the left lower lobe region. Lungs elsewhere are clear. Heart is mildly enlarged with pulmonary vascular normal. Pacemaker leads are attached the right atrium and right ventricle. There is aortic atherosclerosis. There are surgical clips in the left lower chest region. IMPRESSION: Feeding tube tip in distal esophagus with tip directed toward  gastroesophageal junction. Persistent opacity left lower lobe, unchanged from earlier in the day. Stable cardiac prominence. Pacemaker leads attached to right atrium and right ventricle. Aortic Atherosclerosis (ICD10-I70.0). These results will be called to the ordering clinician or representative by the Radiologist Assistant, and communication documented in the PACS or [employer]. Electronically Signed   By: Lowella Grip III M.D.   On: 01/24/2021  11:50   DG Abd Portable 1V  Result Date: 01/24/2021 CLINICAL DATA:  Check feeding catheter placement EXAM: PORTABLE ABDOMEN - 1 VIEW COMPARISON:  01/24/2021 FINDINGS: Feeding catheter is noted extending into the stomach. The weighted portion is noted in the mid stomach directed towards the gastric antrum. No free air is noted. IMPRESSION: Weighted feeding catheter in the midportion of the stomach. Electronically Signed   By: Inez Catalina M.D.   On: 01/24/2021 12:48   DG Abd Portable 1V  Result Date: 01/24/2021 CLINICAL DATA:  Feeding tube placement EXAM: PORTABLE ABDOMEN - 1 VIEW COMPARISON:  January 21, 2011 FINDINGS: Feeding tube tip is at the level of the pharynx. No evident bowel dilatation or air-fluid level to suggest bowel obstruction. No free air. Consolidation left lower lobe noted. IMPRESSION: 1.  Feeding tube tip in pharynx. 2.  No bowel obstruction or free air evident. 3.  Consolidation left lung base. These results will be called to the ordering clinician or representative by the Radiologist Assistant, and communication documented in the PACS or [employer]. Electronically Signed   By: Lowella Grip III M.D.   On: 01/24/2021 10:30    Scheduled Meds: . atorvastatin  80 mg Per Tube Daily  . carvedilol  6.25 mg Per NG tube BID WC  . chlorhexidine  15 mL Mouth Rinse BID  . Chlorhexidine Gluconate Cloth  6 each Topical Q0600  . clopidogrel  75 mg Per Tube Daily  . diphenoxylate-atropine  2 tablet Oral Q8H  . dorzolamide-timolol   1 drop Both Eyes BID  . ferrous sulfate  300 mg Per Tube QODAY  . free water  300 mL Per Tube Q4H  . heparin injection (subcutaneous)  5,000 Units Subcutaneous Q8H  . hydrALAZINE  50 mg Per Tube Q8H  . insulin aspart  0-9 Units Subcutaneous Q4H  . insulin glargine  5 Units Subcutaneous BID  . mouth rinse  15 mL Mouth Rinse q12n4p  . nystatin  5 mL Mouth/Throat QID  . pantoprazole sodium  40 mg Per Tube Daily  . scopolamine  1 patch Transdermal Q72H    Continuous Infusions: . feeding supplement (NEPRO CARB STEADY) Stopped (01/24/21 7262)     LOS: 21 days   Verneita Griffes, MD [facility] 1:02 PM   If 7PM-7AM, please contact night-coverage [email or web address] Password [facility] 01/24/2021, 1:02 PM

## 2021-01-24 NOTE — Progress Notes (Signed)
I was flushing NG Tube and patient started throwing up, so I stopped feedings and contacted the Dr. I noticed the measurement was at 20 and was supposed to be at 72. Advanced until it was at 16. X-ray and MD confirmed it was ok to use. Feedings resumed, will continue to monitor.

## 2021-01-24 NOTE — Consult Note (Addendum)
Renal Service Consult Note [facility]  [place] 01/24/2021 Sol Blazing, MD Requesting Physician: Dr Verlon Au  Reason for Consult: Hypernatremia  HPI: The patient is a 75 y.o. year-old w/ hx of CVA, HTN severe, recurrent diarrhea, DM w/ retinopathy, hx breast cancer and CKD IV presented on 01/02/21 w/ gen weakness and gargling sounds sent from [place] (hx prior CVA x 2 w/ left sided deficits at baseline, hx dementia), was more lethargic and hallucinating. In ED was COVID+, Na 154 and creat 2.98 w/ baseline 2.6. Pt was admitted for AMS w/ COVID 19 infection and dehydration. Other issues in hospital have been Cortrak placed for tube feeds, anemia a/c, anasarca d/t low albumin, AKI on CKD IV, recurrent hypernatremia. Asked to see for hypernatremia.     Total I/O here are 31L in and 23 L out = + 8L.    Wt's are up 4- 14kg depending on accuracy of 1st wt (often taken from prior admit)   BP's have been moderate high to high the whole admission     Has rec'd IV lasix 32m 1-2 per day earlier during hosp stay, not recently    Has rec'd lots of free H20 and prior to that lots of d5 1/2 NS    Lot of IV and po/ NG BP medications.     Na + 153 on admit 1/20 , improved to 141 on 1.27, gradual worsnening up to 150 yest and 151 today.     Creat 2.9 admit > peaked at 4.3 > down to 2.6 today    Pt seen in room, not responding to any questions, eyes partially open. Daughter at bedside. Notes new swelling of arms and legs. Says pt is f/b [facility], possibly Dr BCarolin Sicks    ROS n/a   Past Medical History  Past Medical History:  Diagnosis Date  . Breast cancer ([facility]    2017  . [place]cerebral vascular [place]) ([facility] 11[place]  . Dehydration 07/2020  . Diabetes mellitus without complication ([facility]   . Diabetic retinopathy ([facility]    PDR O[facility]  . Frequent diarrhea 07/2020  . Hyperkalemia 07/2020  . Hypertensive retinopathy    OU  . Stroke ([facility]    Past Surgical History  Past Surgical  History:  Procedure Laterality Date  . CATARACT EXTRACTION Right   . MASTECTOMY Bilateral    "2017" Left Mastectomy; "2019" Right Mastectomy  . PACEMAKER IMPLANT  2017   Family History  Family History  Problem Relation Age of Onset  . Ovarian cancer Mother   . Heart disease Father   . Diabetes Sister   . Diabetes Brother   . Colon cancer Neg Hx   . Esophageal cancer Neg Hx   . Pancreatic cancer Neg Hx   . Stomach cancer Neg Hx   . Liver disease Neg Hx    Social History  reports that she has quit smoking. She has never used smokeless tobacco. She reports previous alcohol use. She reports that she does not use drugs. Allergies  Allergies  Allergen Reactions  . Amlodipine Nausea And Vomiting    Pt refuses to take due to severe n/v that began after starting amlodipine and stopped after discontinuation.   Home medications Prior to Admission medications   Medication Sig Start Date End Date Taking? Authorizing Provider  atorvastatin (LIPITOR) 80 MG tablet Take 80 mg by mouth daily.  10/13/18  Yes [provider]  carvedilol (COREG) 25 MG tablet Take 25 mg by mouth 2 (two)  times daily with a meal.  10/13/18  Yes [provider]  clopidogrel (PLAVIX) 75 MG tablet Take 1 tablet (75 mg total) by mouth daily. 10/24/20  Yes Ghimire, Henreitta Leber, MD  dorzolamide-timolol (COSOPT) 22.3-6.8 MG/ML ophthalmic solution INSTILL 1 DROP INTO BOTH EYES TWICE A DAY Patient taking differently: Place 1 drop into both eyes 2 (two) times daily. 01/14/20  Yes Bernarda Caffey, MD  ferrous sulfate 325 (65 FE) MG tablet Take 325 mg by mouth every other day.   Yes [provider]  furosemide (LASIX) 40 MG tablet Take 40 mg by mouth daily.   Yes [provider]  hydrALAZINE (APRESOLINE) 25 MG tablet Take 1 tablet (25 mg total) by mouth See admin instructions. Take 50 mg by mouth TID. Patient taking differently: Take 25 mg by mouth in the morning and at bedtime. 11/26/20  Yes  Samuella Cota, MD  insulin lispro (HUMALOG) 100 UNIT/ML injection Inject 0-6 Units into the skin 3 (three) times daily. Sliding scale :  70-150= 0 units 151-200= 1 unit 201-250=  2 units 251-300= 3 units 301-350= 4 units 351-400= 5 units Over 400 = 6 units and call MD   Yes [provider]  loperamide (IMODIUM A-D) 2 MG tablet Take 1 tablet (2 mg total) by mouth 4 (four) times daily as needed for diarrhea or loose stools. 08/09/20  Yes [name], FNP  pantoprazole (PROTONIX) 40 MG tablet Take 1 tablet (40 mg total) by mouth daily. 10/24/20  Yes Ghimire, Henreitta Leber, MD  Accu-Chek FastClix Lancets MISC 1 each by Other route as directed. To test blood glucose daily. 05/24/19   [provider]  Blood Glucose Monitoring Suppl (FIFTY50 GLUCOSE METER 2.0) w/Device KIT 1 each by Other route See admin instructions. Use to check blood sugars 08/31/18   [provider]  Insulin Pen Needle (FIFTY50 PEN NEEDLES) 32G X 4 MM MISC 1 each by Other route See admin instructions. Use as instructed. 11/17/16   [provider]     Vitals:   01/24/21 0500 01/24/21 0610 01/24/21 0859 01/24/21 1312  BP:  (!) 164/63 (!) 150/44 (!) 159/67  Pulse:  60 61   Resp:  16  18  Temp:  (!) 96.5 F (35.8 C) (!) 96.3 F (35.7 C) (!) 96.4 F (35.8 C)  TempSrc:  Rectal Axillary Axillary  SpO2:  99% 99% 100%  Weight: 83.1 kg 81.7 kg    Height:       Exam Gen elderly lady, lying flat, no coughing or ^wob, flat, not responding verbally or to commands, eyes partially open, NG tube in place No rash, cyanosis or gangrene Sclera anicteric, throat clear  No jvd or bruits Chest bilat crackles at bases RRR no MRG Abd soft ntnd no mass or ascites +bs GU defer MS no joint effusions or deformity Ext diffuse 2-3+ edema or legs and arms Neuro as above    Home meds:  - coreg 25 bid/ plavix 75/ lipitor 80/ hydralazine 50 tid/ lasix 40 qd  - protonix 40  - insluin lispro 0-6 tid  ac  - prn's/ vitamins/ supplements/ eyedrops     UA 01/02/21 - >300 prot, otherwise neg     Renal US 01/10/21 - IMPRESSION: No hydronephrosis. 9.2 and 10 cm kidneys. Bilateral renal cysts. 6 mm nonobstructive left renal stone.     CT chest 01/06/21 - IMPRESSION: Fairly uniform ground-glass opacity and mild interlobular septal thickening throughout both lungs, compatible with mild cardiogenic pulmonary  edema. Moderate dependent bilateral pleural effusions. Moderate anasarca.     CXR - 01/02/21 >> new IS CHF vs infection                01/04/21 >> no change                 01/05/21  >> no change     Total I/O here are 31L in and 23 L out = + 8L.    Wt's are up 4- 14kg depending on accuracy of 1st wt (often taken from prior admit)     Na+ -- 153 on admit 1/20  > improved to 141 on 1/27 >> up to 150 yest and 151 today    BP's have been moderate high to high the whole admission        Date  Creat    eGFR    2020   1.61    36, CKD III    Mid 2021  2.56- 2.66   19- 20 ml/min, CKD IV    Late 2021  2.58- 3.35   14- 19 ml/min, CKD IV    Jan- Feb 2022 2.94 > 4.3 peak > 2.63 10- 18 ml/min       ECHO LVEF 60-65%, mod LVH   Assessment/ Plan: 1. Hypernatremia - hypervolemic, would stop any IVF"s. Treatment typically for this is diuresis. However given her borderline renal function, high dose IV lasix brings risk of significantly worsening renal function.  D/w daughter. They do not want aggressive diureiss at this time.   2. AKI on CKD IV - b/l creat 2.4- 2.6 from 2021, eGFR 19- 20 ml/min.  AKI here w/ creat 2.9 on admit, peak at 4.3 and now down to 2.63.  With sig vol overload some of creatinine fluctuations are probably "cardiorenal" in origin. Creat down to baseline now. Not a dialysis candidate due to severe comorbidities. See above regarding diuresis.   3. AMS - I suspect this is multifactorial w/ old CVA and low reserve, stress of stage IV-V CKD and COVID infection together.  Hypernatremia doubt is bad  enough to cause AMS in and of itself.  4. COVID infection 5. CHF - due to vol overload, related to bad renal function. No good Rx except for diuresis , family does not want to risk using high dose IV lasix given her borderline renal condition as above. No other suggestions at this time.  6. Prognosis - overall is very poor.  7. H/o CVA - prior , w/ L sided symptoms 8. Nutrition - w/ Cortrak in place 9. EOL - suggest DNR for this patient      Kelly Splinter  MD 01/24/2021, 4:06 PM  Recent Labs  Lab 01/23/21 0425 01/24/21 0420  WBC 3.7* 3.6*  HGB 8.0* 8.2*   Recent Labs  Lab 01/20/21 0338 01/21/21 0422 01/22/21 0425 01/23/21 0425 01/24/21 0420  K 4.9   < > 5.4* 5.1 5.0  BUN 59*   < > 59* 59* 60*  CREATININE 2.81*   < > 2.67* 2.80* 2.63*  CALCIUM 8.0*   < > 8.5* 8.5* 8.9  PHOS 3.6  --  3.5  --   --    < > = values in this interval not displayed.

## 2021-01-24 NOTE — Consult Note (Addendum)
Consultation Note Date: 01/24/2021   Patient Name: Desiree Pollard  DOB: 1946-12-12  MRN: [id number]  Age / Sex: 75 y.o., female   PCP: [name], FNP Referring Physician: Nita Sells, MD   REASON FOR CONSULTATION:Establishing goals of care  Palliative Care consult requested for goals of care discussion in this 75 y.o. female with multiple medical problems including strokes (02/2020 and 09/2020) with left sided weakness, aphasia, DM2, HTN, dementia, and CKD4. She presented to the ER from [facility] with concerns of altered mental status per sister. During work-up Sodium 154, BUN 42 (11/25/2020 BUN 17) Creatinine 2.98. Found to be COVID positive. Since admission continues to be encephalopathic. Evaluated by SLP and failed swallowing evaluation. Coretrk in place with recommendations for NPO.   Clinical Assessment and Goals of Care: I have reviewed medical records including lab results, imaging, Epic notes, and MAR. I spoke with patient's sister, Trudi Ida [facility]) by phone to discuss diagnosis prognosis, [place], EOL wishes, disposition and options.  I introduced Palliative Medicine as specialized medical care for people living with serious illness. It focuses on providing relief from the symptoms and stress of a serious illness. The goal is to improve quality of life for both the patient and the family. Sister verbalized understanding and appreciation.   We discussed a brief life review of the patient, along with her functional and nutritional status. [name] shares patient is divorced with 1 son, who unfortunately is incarcerated in federal prison. She is a retired [employer]ar[employer] manager and enjoys spending time with family and her church family. Christian faith.   Prior to admission patient was at [facility] facility due to recent stroke in October. Sister reports prior to October she lived in the home with her and was able to perform most of her ADLs, despite having her initial stroke  back in April 2021. [name] shares patient was ambulatory at the facility with a walker, had a good appetite, and able to engage in conversations with family (with some delay). She would attend church every Sunday (sister would pick her up). She spent Thanksgiving and Christmas in the home with family which she enjoyed. Sister shares how patient was tearful that she had to return back to the facility.   We discussed Her current illness and what it means in the larger context of Her on-going co-morbidities. With specific discussions regarding patient's poor overall functional and nutritional status, failed swallowing evaluation, dysphagia, and other co-morbidities. Natural disease trajectory and expectations at EOL were discussed.  Christine verbalized understanding of her sister's current illness and co-morbidities. She expressed she and family remains hopeful and is considering a timed trial with the feeding tube to allow her every opportunity to continue to "fight!"  I attempted to elicit values and goals of care important to the patient.    I discussed at length artificial feedings and what care would look like in a facility (with limitations on placement) vs. Home with full family support. I encouraged sister to consider patient's quality of life and keep patient as the center focused of all decisions and if she was able to speak for herself what would she want.   [name] became tearful expressing she knows patient would not want to suffer as she was a very independent woman. She continues to compare her current condition to patient's previous baseline a month ago and around the holiday times. Sister acknowledges some decline expressing her wishes that she could have brought patient home and provided care for her  allowing her to receive the attention she needed. Support provided.   Education provided on artificial feedings, with awareness PEG/artificial feedings do not decrease the risk for  aspiration, does not provide comfort, does not improve functional status or extend life. AMA guidelines advises against those patients with dementia due to the fear of dislodgement, agitation, and further complications. Sister shared use of restraints or mitts. Education provided on temporary use of these measures while hospitalized however ethically this is not to be done on a long-term basis for safety and respectable reasons to patient. Christine verbalized understanding.   The difference between aggressive medical intervention and comfort care was considered in light of the patient's goals of care.   Sister asked appropriate questions regarding hospice care in the home versus a facility. Extensive education provided.   She is clear in expressing although she understands her sister's poor prognosis she is not ready to "give up" as of yet. She also mentions request for further evaluations by Nephrology as mentioned by her Nephrologist outpatient.   We discussed best case and worst case scenario including at what point would it be time to focus on doing things for Ms. Hufstetler to (comfort) vs doing things to her (aggressively). Sister verbalized appreciation of discussion and shares this has forced her to somewhat think differently of what is needed for patient.   She would like to continue to treat the treatable, have any necessary work-ups to make sure her condition cannot be improved. She would like to continue ongoing discussions with her family including patient's incarcerated son to determine any final decisions, while watchfully waiting for any improvement/stability.   Patient does not have a documented advanced directive outlining her wishes, however after her initial stroke in April 2021 she did complete documentation naming her sister, Trudi Ida her HCPOA.  We also discussed at length patient's full code status with consideration to her current illness and co-morbidities. Sister is  requesting full code at this time expressing awareness of the need for DNR given patient's co-morbidities.   Hospice and Palliative Care services outpatient were explained and offered.  Christine verbalized her understanding and awareness of both palliative and hospice's goals and philosophy of care.  Discussing with family and will make a decision based on patient's condition.   Questions and concerns were addressed.  Hard Choices booklet emailed to sister for review and to utilize in family discussions. 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 The family was encouraged to call with questions or concerns.  PMT will continue to support holistically.   SOCIAL HISTORY:     reports that she has quit smoking. She has never used smokeless tobacco. She reports previous alcohol use. She reports that she does not use drugs.  CODE STATUS: Full code  ADVANCE DIRECTIVES: Primary Decision Maker: Christine Cheek (sister/POA)    SYMPTOM MANAGEMENT: per attending  Palliative Prophylaxis:   Aspiration, Bowel Regimen, Delirium Protocol, Eye Care, Frequent Pain Assessment, Oral Care, Palliative Wound Care and Turn Reposition  PSYCHO-SOCIAL/SPIRITUAL:  Support System: Family  Desire for further Chaplaincy support: did not address  Additional Recommendations (Limitations, Scope, Preferences):  Full Scope Treatment  Education on hospice/palliative    PAST MEDICAL HISTORY: Past Medical History:  Diagnosis Date  . Breast cancer ([place])    2017  . CVA (cerebral vascular accident) ([place]) 11/2018  . Dehydration 07/2020  . Diabetes mellitus without complication ([place])   . 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 Frequent diarrhea 07/2020  . Hyperkalemia 07/2020  . Hypertensive retinopathy    OU  . Stroke [facility])     ALLERGIES:  is allergic to amlodipine.   MEDICATIONS:  Current Facility-Administered Medications  Medication Dose  Route Frequency Provider Last Rate Last Admin  . acetaminophen (TYLENOL) tablet 650 mg  650 mg Per Tube Q6H PRN Dessa Phi, DO      . atorvastatin (LIPITOR) tablet 80 mg  80 mg Per Tube Daily Dessa Phi, DO   80 mg at 01/24/21 1309  . carvedilol (COREG) tablet 6.25 mg  6.25 mg Per NG tube BID WC Hall, Carole N, DO   6.25 mg at 01/24/21 0859  . chlorhexidine (PERIDEX) 0.12 % solution 15 mL  15 mL Mouth Rinse BID Mariel Aloe, MD   15 mL at 01/24/21 1214  . Chlorhexidine Gluconate Cloth 2 % PADS 6 each  6 each Topical Q0600 Mariel Aloe, MD   6 each at 01/24/21 1015  . clopidogrel (PLAVIX) tablet 75 mg  75 mg Per Tube Daily Dessa Phi, DO   75 mg at 01/24/21 1310  . diphenoxylate-atropine (LOMOTIL) 2.5-0.025 MG per tablet 2 tablet  2 tablet Oral Q8H Nita Sells, MD   2 tablet at 01/24/21 0519  . dorzolamide-timolol (COSOPT) 22.3-6.8 MG/ML ophthalmic solution 1 drop  1 drop Both Eyes BID Etta Quill, DO   1 drop at 01/24/21 1018  . feeding supplement (NEPRO CARB STEADY) liquid 1,000 mL  1,000 mL Per Tube Continuous Nita Sells, MD 50 mL/hr at 01/24/21 1311 Restarted at 01/24/21 1311  . ferrous sulfate 300 (60 Fe) MG/5ML syrup 300 mg  300 mg Per Tube Orest Dikes, DO   300 mg at 01/22/21 0902  . free water 300 mL  300 mL Per Tube Q4H Samtani, Jai-Gurmukh, MD   300 mL at 01/24/21 1310  . heparin injection 5,000 Units  5,000 Units Subcutaneous Q8H Hall, Carole N, DO   5,000 Units at 01/24/21 9417  . hydrALAZINE (APRESOLINE) injection 10-20 mg  10-20 mg Intravenous Q4H PRN Etta Quill, DO   20 mg at 01/16/21 1156  . hydrALAZINE (APRESOLINE) tablet 50 mg  50 mg Per Tube Q8H Hall, Carole N, DO   50 mg at 01/24/21 0520  . insulin aspart (novoLOG) injection 0-9 Units  0-9 Units Subcutaneous Q4H Mariel Aloe, MD   1 Units at 01/24/21 0042  . insulin glargine (LANTUS) injection 5 Units  5 Units Subcutaneous BID Irene Pap N, DO   5 Units at 01/24/21 1213   . MEDLINE mouth rinse  15 mL Mouth Rinse q12n4p Mariel Aloe, MD   15 mL at 01/24/21 1211  . metoprolol tartrate (LOPRESSOR) injection 2.5 mg  2.5 mg Intravenous Q6H PRN Irene Pap N, DO      . nystatin (MYCOSTATIN) 100000 UNIT/ML suspension 500,000 Units  5 mL Mouth/Throat QID Dessa Phi, DO   500,000 Units at 01/24/21 1214  . ondansetron (ZOFRAN) tablet 4 mg  4 mg Per Tube Q6H PRN Dessa Phi, DO       Or  . ondansetron [facility]) injection 4 mg  4 mg Intravenous Q6H PRN Dessa Phi, DO      . pantoprazole sodium (PROTONIX) 40 mg/20 mL oral suspension 40 mg  40 mg Per Tube Daily Dessa Phi, DO   40 mg at 01/24/21 1310  . scopolamine (TRANSDERM-SCOP) 1 MG/3DAYS 1.5 mg  1 patch  Transdermal Q72H Nita Sells, MD   1.5 mg at 01/22/21 1843    VITAL SIGNS: BP (!) 159/67 (BP Location: Left Arm)   Pulse 61   Temp (!) 96.4 F (35.8 C) (Axillary)   Resp 18   Ht 5\' 7"  (1.702 m)   Wt 81.7 kg   SpO2 100%   BMI 28.21 kg/m  Filed Weights   01/23/21 0500 01/24/21 0500 01/24/21 0610  Weight: 81 kg 83.1 kg 81.7 kg    Estimated body mass index is 28.21 kg/m as calculated from the following:   Height as of this encounter: 5\' 7"  (1.702 m).   Weight as of this encounter: 81.7 kg.  LABS: CBC:    Component Value Date/Time   WBC 3.6 (L) 01/24/2021 0420   HGB 8.2 (L) 01/24/2021 0420   HGB 10.5 (L) 08/07/2020 1559   HCT 28.3 (L) 01/24/2021 0420   HCT 34.4 08/07/2020 1559   PLT 129 (L) 01/24/2021 0420   PLT 199 08/07/2020 1559   Comprehensive Metabolic Panel:    Component Value Date/Time   NA 149 (H) 01/24/2021 0420   NA 143 08/07/2020 1559   K 5.0 01/24/2021 0420   BUN 60 (H) 01/24/2021 0420   BUN 27 08/07/2020 1559   CREATININE 2.63 (H) 01/24/2021 0420   ALBUMIN 2.2 (L) 01/24/2021 0420   ALBUMIN 4.4 08/07/2020 1559     Review of Systems  Unable to perform ROS: Dementia   The above conversation was completed via telephone due to the visitor restrictions  during the COVID-19 pandemic. Thorough chart review and discussion with necessary members of the care team was completed as part of assessment. All issues were discussed and addressed but no physical exam was performed.  Prognosis: POOR   Discharge Planning:  To Be Determined  Recommendations: . Full Code-as confirmed by sister, [name] . Continue with current plan of care  . Full Scope/Aggressive care . Family remains hopeful. Requesting Nephrology consult as advised by outpatient provider. Watchful waiting to allow patient every opportunity to improve/stabilize. Sister express awareness of poor prognosis and patient's decline over time.  . Ongoing family discussions. Detailed discussion/educatio provided regarding artificial feedings/PEG.  Marland Kitchen PMT will continue to support and follow. Please call team line with urgent needs.   Palliative Performance Scale:                [name] (sister) expressed understanding and was in agreement with this plan.   Thank you for allowing the Palliative Medicine Team to assist in the care of this patient.  Time In: 1300 Time Out: 1405 Time Total: 65 min.   Visit consisted of counseling and education dealing with the complex and emotionally intense issues of symptom management and palliative care in the setting of serious and potentially life-threatening illness.Greater than 50%  of this time was spent counseling and coordinating care related to the above assessment and plan.  Signed by:  Alda Lea, AGPCNP-BC Palliative Medicine Team  Phone: [phone] Pager: [phone] Amion: Bjorn Pippin

## 2021-01-24 NOTE — TOC Progression Note (Signed)
Transition of Care [facility]) - Progression Note    Patient Details  Name: Lashawne Dura MRN: [id number] Date of Birth: 27-Mar-1946  Transition of Care [facility]) CM/SW Contact  Ross Ludwig, [place] Phone Number: 01/24/2021, 5:05 PM  Clinical Narrative:     Patient from [place], plan to potentially return back to SNF once medically ready for discharge.    Expected Discharge Plan and Services  SNF                                               Social Determinants of Health (SDOH) Interventions    Readmission Risk Interventions Readmission Risk Prevention Plan 01/13/2021  Transportation Screening Complete  PCP or Specialist Appt within 3-5 Days Complete  Social Work Consult for [place] Planning/Counseling Complete  Palliative Care Screening Complete  Medication Review [employer]) Referral to Pharmacy

## 2021-01-25 DIAGNOSIS — G9341 Metabolic encephalopathy: Secondary | ICD-10-CM | POA: Diagnosis not present

## 2021-01-25 LAB — CBC WITH DIFFERENTIAL/PLATELET
Abs Immature Granulocytes: 0.14 10*3/uL — ABNORMAL HIGH (ref 0.00–0.07)
Basophils Absolute: 0.1 10*3/uL (ref 0.0–0.1)
Basophils Relative: 1 %
Eosinophils Absolute: 0.1 10*3/uL (ref 0.0–0.5)
Eosinophils Relative: 3 %
HCT: 26.6 % — ABNORMAL LOW (ref 36.0–46.0)
Hemoglobin: 7.8 g/dL — ABNORMAL LOW (ref 12.0–15.0)
Immature Granulocytes: 4 %
Lymphocytes Relative: 18 %
Lymphs Abs: 0.7 10*3/uL (ref 0.7–4.0)
MCH: 29.1 pg (ref 26.0–34.0)
MCHC: 29.3 g/dL — ABNORMAL LOW (ref 30.0–36.0)
MCV: 99.3 fL (ref 80.0–100.0)
Monocytes Absolute: 0.2 10*3/uL (ref 0.1–1.0)
Monocytes Relative: 4 %
Neutro Abs: 2.6 10*3/uL (ref 1.7–7.7)
Neutrophils Relative %: 70 %
Platelets: 127 10*3/uL — ABNORMAL LOW (ref 150–400)
RBC: 2.68 MIL/uL — ABNORMAL LOW (ref 3.87–5.11)
RDW: 15.3 % (ref 11.5–15.5)
WBC: 3.7 10*3/uL — ABNORMAL LOW (ref 4.0–10.5)
nRBC: 0 % (ref 0.0–0.2)

## 2021-01-25 LAB — COMPREHENSIVE METABOLIC PANEL
ALT: 60 U/L — ABNORMAL HIGH (ref 0–44)
AST: 30 U/L (ref 15–41)
Albumin: 2.3 g/dL — ABNORMAL LOW (ref 3.5–5.0)
Alkaline Phosphatase: 121 U/L (ref 38–126)
Anion gap: 6 (ref 5–15)
BUN: 64 mg/dL — ABNORMAL HIGH (ref 8–23)
CO2: 22 mmol/L (ref 22–32)
Calcium: 8.7 mg/dL — ABNORMAL LOW (ref 8.9–10.3)
Chloride: 118 mmol/L — ABNORMAL HIGH (ref 98–111)
Creatinine, Ser: 2.59 mg/dL — ABNORMAL HIGH (ref 0.44–1.00)
GFR, Estimated: 19 mL/min — ABNORMAL LOW (ref >60)
Glucose, Bld: 136 mg/dL — ABNORMAL HIGH (ref 70–99)
Potassium: 5.3 mmol/L — ABNORMAL HIGH (ref 3.5–5.1)
Sodium: 146 mmol/L — ABNORMAL HIGH (ref 135–145)
Total Bilirubin: 0.5 mg/dL (ref 0.3–1.2)
Total Protein: 6.1 g/dL — ABNORMAL LOW (ref 6.5–8.1)

## 2021-01-25 LAB — GLUCOSE, CAPILLARY
Glucose-Capillary: 109 mg/dL — ABNORMAL HIGH (ref 70–99)
Glucose-Capillary: 116 mg/dL — ABNORMAL HIGH (ref 70–99)
Glucose-Capillary: 117 mg/dL — ABNORMAL HIGH (ref 70–99)
Glucose-Capillary: 124 mg/dL — ABNORMAL HIGH (ref 70–99)
Glucose-Capillary: 127 mg/dL — ABNORMAL HIGH (ref 70–99)
Glucose-Capillary: 136 mg/dL — ABNORMAL HIGH (ref 70–99)
Glucose-Capillary: 148 mg/dL — ABNORMAL HIGH (ref 70–99)

## 2021-01-25 MED ORDER — DIPHENOXYLATE-ATROPINE 2.5-0.025 MG PO TABS: 2.0000 | ORAL_TABLET | Freq: Two times a day (BID) | ORAL | Status: DC | PRN

## 2021-01-25 NOTE — Progress Notes (Signed)
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   She was started on Remdesivir.  There was initial concern for angioedema.  PCCM was consulted but did not feel that she required intubation.  MRI brain unremarkable for acute change. Hospitalization complicated by continued encephalopathy.   Assessment/Plan: Principal Problem:   Acute metabolic encephalopathy Active Problems:   Insulin dependent type 2 diabetes mellitus (HCC)   Hypertension   History of stroke   CKD (chronic kidney disease), stage IV ([place])   COVID-19 virus infection   Hypernatremia  Improving acute metabolic encephalopathy secondary to AKI Covid hypernatremia -At baseline, patient is conversant per sister.  Able to walk with walker at baseline.  -CT head x2 without acute intracranial abnormality-EEG negative,-MRI brain without acute intracranial abnormality  -Dr. [facility]ly [facility] on 01/08/21, recommended treating the treatable, hypernatremia, AKI, COVID-19 viral infection.  Acute on chronic anemia of chronic disease Baseline Hg appears to be 9.8 Received 1 unit PRBC on 01/19/2021 Drop of hemoglobin down to 6.7 on 01/20/2021. Received another unit of blood on 01/20/2021 to maintain hemoglobin greater than 7.0. FOBT negative. No overt bleeding.  Anasarca secondary to hypoalbuminemia Albumin 1.9 on 01/20/2021 1 dose of IV Lasix 40 mg daily given on 01/20/2021. Elevate extremities as able.  Severe dysphagia post cortrak placement. Appreciate speech therapist evaluation Cortrack in place HOB 30 degrees, increase free water to 300 every 4 hours  given hypernatremia -Cortrak in place for nutrition, failed swallow evaluation. -Palliative care meeting sought given poor likely prognosis overall this was discussed with family by Dr. Nevada Crane -Family contemplating next steps  Nonoliguric AKI on CKD stage IV likely prerenal in the setting of dehydration and poor oral intake. -Baseline creatinine 2.6, no evidence of hydronephrosis on renal ultrasound 01/10/2021. Creatinine trending 2.8-2.9 range currently 2.6 Net +3 L -Nephrology evaluated the patient 2/11 feel patient is very poor candidate for dialysis and have discussed this with family -We will follow up in the morning regarding decision making  COVID-19 viral infection -Tested positive on 01/02/21 -Remdesivir x 3 doses -Supportive treatments as ordered, encourage IS, encourage prone positioning, encourage mobilization as able. -Currently on room air with O2 saturation 99%.  Hypernatremia/Hyperkalemia Increased frequency of free water to 300 every 4 instead Potassium remains elevated Feeds changed to Nepro which has less potassium-recheck a.m. labs  Possible angioedema -Resolved, patient maintaining airway.  CT neck reassuring without significant swelling  HTN BP is not at goal Increase hydralazine dose to 50 mg 3 times daily. Coreg 6.25 mg twice daily. IV hydralazine as needed with parameters.  Chronic diastolic CHF -EF 82-42%  -Diuretics were held due to rising of creatinine. Continue to monitor strict I's and O's and daily weight. Net I&O 4 L and we may have to back down off of free water in the next several days  History of stroke -Diagnosed October 2021, with residual left-sided weakness and expressive aphasia -Continue Plavix, Lipitor via tube feeding -PT has recommended SNF  Resolved fever No fevers recorded in the last 24 hours.  Goals of care Very poor prognosis in the setting of history of stroke, persistent encephalopathy, severe dysphagia, CKD,  generalized weakness, recent COVID-19 infection, hypoalbuminemia  with anasarca, anemia of chronic disease requiring blood transfusion, and advanced age. Palliative care consult to assist with establishing goals of care.  DVT prophylaxis:  SQ heparin 3 times daily Place and maintain sequential compression device Start: 01/02/21 2308 Code Status: Full code Family Communication:  Called and updated Sister [name] [phone] on 01/24/2021 SHE wants to give a chance for recovery Have encouraged her to think carefully over several days about next steps as we do not have a clear disposition for the patient as her facility may not be able to accept the patient with a feeding tube Family is undecided at this time about PEG tube placement and I have encouraged them to think over these options carefully as this does not reduce the risk of aspiration Status is: Inpatient  Dispo: The patient is from: Home.              Anticipated d/c is to: SNF.              Anticipated d/c date is: 01/24/2021.               Patient currently not stable for discharge due to persistent altered mental status and Cortrak dependent.  Patient will be difficult to place   Subjective  Remains minimally interactive No new issues reported Telemetry has been taken off/he has been predominantly in sinus with occasional tachycardia   Objective: Vitals:   01/24/21 1312 01/24/21 2000 01/25/21 0400 01/25/21 1109  BP: (!) 159/67   (!) 171/66  Pulse:    68  Resp: 18  19 19   Temp: (!) 96.4 F (35.8 C) 98.7 F (37.1 C) 98.7 F (37.1 C) 99.1 F (37.3 C)  TempSrc: Axillary Rectal Axillary Axillary  SpO2: 100%   97%  Weight:   83 kg   Height:        Intake/Output Summary (Last 24 hours) at 01/25/2021 1604 Last data filed at 01/25/2021 0900 Gross per 24 hour  Intake 1900 ml  Output 350 ml  Net 1550 ml   Filed Weights   01/24/21 0500 01/24/21 0610 01/25/21 0400  Weight: 83.1 kg 81.7 kg 83 kg    Exam:    Interactive but does not really voice concerns nonverbal for the most part pretty flat affect some secretions for diet as lethargic at times Poor exam but chest is clear S1-S2 no murmur Patient has underlying anasarca Does not follow commands meaningfully   Data Reviewed: CBC: Recent Labs  Lab 01/20/21 0338 01/20/21 1103 01/21/21 0422 01/22/21 0425 01/23/21 0425 01/24/21 0420 01/25/21 0429  WBC 5.1  --  4.5 4.2 3.7* 3.6* 3.7*  NEUTROABS 3.7  --   --  3.1 2.3 2.6 2.6  HGB 7.4*   < > 8.1* 8.0* 8.0* 8.2* 7.8*  HCT 24.9*   < > 26.9* 26.5* 27.4* 28.3* 26.6*  MCV 95.8  --  97.1 95.7 99.3 99.6 99.3  PLT 175  --  173 129* 151 129* 127*   < > = values in this interval not displayed.   Basic Metabolic Panel: Recent Labs  Lab 01/20/21 0338 01/21/21 0422 01/22/21 0425 01/23/21 0425 01/24/21 0420 01/25/21 0429  NA 148* 150* 150* 151* 149* 146*  K 4.9 5.0 5.4* 5.1 5.0 5.3*  CL 120* 119* 121* 120* 120* 118*  CO2 20* 22 23 22 22 22   GLUCOSE 245* 221* 197* 111* 135* 136*  BUN 59* 61* 59* 59* 60* 64*  CREATININE 2.81* 2.94* 2.67* 2.80* 2.63* 2.59*  CALCIUM 8.0*  8.6* 8.5* 8.5* 8.9 8.7*  MG 2.3  --  2.3  --   --   --   PHOS 3.6  --  3.5  --   --   --    GFR: Estimated Creatinine Clearance: 20.5 mL/min (A) (by C-G formula based on SCr of 2.59 mg/dL (H)). Liver Function Tests: Recent Labs  Lab 01/20/21 0338 01/22/21 0425 01/23/21 0425 01/24/21 0420 01/25/21 0429  AST 34 41 34 47* 30  ALT 71* 82* 70* 77* 60*  ALKPHOS 107 110 106 121 121  BILITOT 0.4 0.8 0.4 0.5 0.5  PROT 5.3* 5.7* 5.8* 6.2* 6.1*  ALBUMIN 1.9* 2.1* 2.1* 2.2* 2.3*   No results for input(s): LIPASE, AMYLASE in the last 168 hours. No results for input(s): AMMONIA in the last 168 hours. Coagulation Profile: No results for input(s): INR, PROTIME in the last 168 hours. Cardiac Enzymes: No results for input(s): CKTOTAL, CKMB, CKMBINDEX, TROPONINI in the last 168 hours. BNP (last 3 results) No results for  input(s): PROBNP in the last 8760 hours. HbA1C: No results for input(s): HGBA1C in the last 72 hours. CBG: Recent Labs  Lab 01/24/21 2038 01/25/21 0015 01/25/21 0359 01/25/21 0827 01/25/21 1242  GLUCAP 137* 148* 136* 124* 117*   Lipid Profile: No results for input(s): CHOL, HDL, LDLCALC, TRIG, CHOLHDL, LDLDIRECT in the last 72 hours. Thyroid Function Tests: No results for input(s): TSH, T4TOTAL, FREET4, T3FREE, THYROIDAB in the last 72 hours. Anemia Panel: No results for input(s): VITAMINB12, FOLATE, FERRITIN, TIBC, IRON, RETICCTPCT in the last 72 hours. Urine analysis:    Component Value Date/Time   COLORURINE YELLOW 01/02/2021 2053   APPEARANCEUR CLEAR 01/02/2021 2053   LABSPEC 1.014 01/02/2021 2053   PHURINE 5.0 01/02/2021 2053   GLUCOSEU 50 (A) 01/02/2021 2053   HGBUR NEGATIVE 01/02/2021 2053   BILIRUBINUR NEGATIVE 01/02/2021 2053   KETONESUR NEGATIVE 01/02/2021 2053   PROTEINUR >=300 (A) 01/02/2021 2053   NITRITE NEGATIVE 01/02/2021 2053   LEUKOCYTESUR NEGATIVE 01/02/2021 2053   Sepsis Labs: @LABRCNTIP (procalcitonin:4,lacticidven:4)  )No results found for this or any previous visit (from the past 240 hour(s)).    Studies: No results found.  Scheduled Meds: . atorvastatin  80 mg Per Tube Daily  . carvedilol  6.25 mg Per NG tube BID WC  . chlorhexidine  15 mL Mouth Rinse BID  . Chlorhexidine Gluconate Cloth  6 each Topical Q0600  . clopidogrel  75 mg Per Tube Daily  . diphenoxylate-atropine  2 tablet Oral Q8H  . dorzolamide-timolol  1 drop Both Eyes BID  . ferrous sulfate  300 mg Per Tube QODAY  . free water  300 mL Per Tube Q4H  . heparin injection (subcutaneous)  5,000 Units Subcutaneous Q8H  . hydrALAZINE  50 mg Per Tube Q8H  . insulin aspart  0-9 Units Subcutaneous Q4H  . insulin glargine  5 Units Subcutaneous BID  . mouth rinse  15 mL Mouth Rinse q12n4p  . nystatin  5 mL Mouth/Throat QID  . pantoprazole sodium  40 mg Per Tube Daily  . scopolamine  1  patch Transdermal Q72H    Continuous Infusions: . feeding supplement (NEPRO CARB STEADY) 50 mL/hr at 01/24/21 1311     LOS: 22 days   Verneita Griffes, MD [facility] 4:04 PM   If 7PM-7AM, please contact night-coverage [email or web address] Password [facility] 01/25/2021, 4:04 PM

## 2021-01-25 NOTE — Plan of Care (Signed)
  Problem: Education: °Goal: Knowledge of General Education information will improve °Description: Including pain rating scale, medication(s)/side effects and non-pharmacologic comfort measures °Outcome: Progressing °  °Problem: Health Behavior/Discharge Planning: °Goal: Ability to manage health-related needs will improve °Outcome: Progressing °  °Problem: Clinical Measurements: °Goal: Ability to maintain clinical measurements within normal limits will improve °Outcome: Progressing °Goal: Will remain free from infection °Outcome: Progressing °Goal: Diagnostic test results will improve °Outcome: Progressing °Goal: Respiratory complications will improve °Outcome: Progressing °Goal: Cardiovascular complication will be avoided °Outcome: Progressing °  °Problem: Activity: °Goal: Risk for activity intolerance will decrease °Outcome: Progressing °  °Problem: Nutrition: °Goal: Adequate nutrition will be maintained °Outcome: Progressing °  °Problem: Coping: °Goal: Level of anxiety will decrease °Outcome: Progressing °  °Problem: Elimination: °Goal: Will not experience complications related to bowel motility °Outcome: Progressing °Goal: Will not experience complications related to urinary retention °Outcome: Progressing °  °Problem: Pain Managment: °Goal: General experience of comfort will improve °Outcome: Progressing °  °Problem: Safety: °Goal: Ability to remain free from injury will improve °Outcome: Progressing °  °Problem: Skin Integrity: °Goal: Risk for impaired skin integrity will decrease °Outcome: Progressing °  °Problem: Education: °Goal: Knowledge of risk factors and measures for prevention of condition will improve °Outcome: Progressing °  °Problem: Coping: °Goal: Psychosocial and spiritual needs will be supported °Outcome: Progressing °  °Problem: Respiratory: °Goal: Will maintain a patent airway °Outcome: Progressing °Goal: Complications related to the disease process, condition or treatment will be avoided or  minimized °Outcome: Progressing °  °Problem: Education: °Goal: Knowledge of risk factors and measures for prevention of condition will improve °Outcome: Progressing °  °Problem: Coping: °Goal: Psychosocial and spiritual needs will be supported °Outcome: Progressing °  °Problem: Respiratory: °Goal: Will maintain a patent airway °Outcome: Progressing °Goal: Complications related to the disease process, condition or treatment will be avoided or minimized °Outcome: Progressing °  °

## 2021-01-25 NOTE — Progress Notes (Addendum)
[facility] Progress Note  Subjective: Na + down to 146, Creat down by one tenth of a point  Vitals:   01/24/21 1312 01/24/21 2000 01/25/21 0400 01/25/21 1109  BP: (!) 159/67   (!) 171/66  Pulse:    68  Resp: _0 Temp: (!) 96.4 F (35.8 C) 98.7 F (37.1 C) 98.7 F (37.1 C) 99.1 F (37.3 C)  TempSrc: Axillary Rectal Axillary Axillary  SpO2: 100%   97%  Weight:   83 kg   Height:        Exam: Gen elderly lady, lying flat, no coughing or ^wob, flat, not responding verbally, lethargic Chest bilat crackles at bases RRR no MRG Abd soft ntnd no mass or ascites +bs Ext diffuse 2-3+ edema or legs and arms Neuro as above    Home meds:  - coreg 25 bid/ plavix 75/ lipitor 80/ hydralazine 50 tid/ lasix 40 qd  - protonix 40  - insluin lispro 0-6 tid ac  - prn's/ vitamins/ supplements/ eyedrops     UA 01/02/21 - >300 prot, otherwise neg     Renal US 01/10/21 - IMPRESSION: No hydronephrosis. 9.2 and 10 cm kidneys. Bilateral renal cysts. 6 mm nonobstructive left renal stone.     CT chest 01/06/21 - IMPRESSION: Fairly uniform ground-glass opacity and mild interlobular septal thickening throughout both lungs, compatible with mild cardiogenic pulmonary edema. Moderate dependent bilateral pleural effusions. Moderate anasarca.     CXR - 01/02/21 >> new IS CHF vs infection                01/04/21 >> no change                 01/05/21  >> no change     Total I/O here are 31L in and 23 L out = + 8L.    Wt's are up 4- 14kg depending on accuracy of 1st wt (often taken from prior admit)     Na+ -- 153 on admit 1/20  > improved to 141 on 1/27 >> up to 150 yest and 151 today    BP's have been moderate high to high the whole admission        Date                      Creat                                       eGFR    2020                         1.61                                         36, CKD III    Mid 2021                  2.56- 2.66                                19- 20  ml/min, CKD IV    Late 2021                 2.58- 3.35  14- 19 ml/min, CKD IV    Jan- Feb 2022          2.94 > 4.3 peak > 2.63            10- 18 ml/min                   ECHO LVEF 60-65%, mod LVH   Assessment/ Plan: 1. Hypernatremia - hypervolemic. Would stop any IVF"s. Only thing there is to offer for this is IV lasix/ diuresis.  If family changes there mind, please call and we can see if diuresis would help for the hypernatremia.  If there are any other questions, please let us know. Will sign off.  2. AKI on CKD IV - b/l creat 2.4- 2.6 from 2021, eGFR 19- 20 ml/min.  AKI here w/ creat 2.9 on admit, peak at 4.3 and now down to 2.63.  With sig vol overload some of creatinine fluctuations may be "cardiorenal" in origin. Creat down to baseline now. Pt is not a dialysis candidate due to severe comorbidities and debility.   3. AMS - suspect multifactorial w/ prior CVA and COVID infection. Uremia is not contributing to AMS at the current levels of renal function (eGFR ~ 19).  4. COVID infection 5. Volume overload/ anasarca - combination of advanced renal faliure, IVF's, low albumin levels.  Per echo EF is normal.  6. Prognosis - overall is very poor.  7. H/o CVA - prior , w/ L sided symptoms 8. Nutrition - w/ Cortrak in place 9. EOL - pt is not doing well, palliative care is seeing for Rosana Fret 01/25/2021, 3:51 PM   Recent Labs  Lab 01/20/21 0338 01/20/21 1103 01/22/21 0425 01/23/21 0425 01/24/21 0420 01/25/21 0429  K 4.9   < > 5.4*   < > 5.0 5.3*  BUN 59*   < > 59*   < > 60* 64*  CREATININE 2.81*   < > 2.67*   < > 2.63* 2.59*  CALCIUM 8.0*   < > 8.5*   < > 8.9 8.7*  PHOS 3.6  --  3.5  --   --   --   HGB 7.4*   < > 8.0*   < > 8.2* 7.8*   < > = values in this interval not displayed.   Inpatient medications: . atorvastatin  80 mg Per Tube Daily  . carvedilol  6.25 mg Per NG tube BID WC  . chlorhexidine  15 mL Mouth Rinse BID  .  Chlorhexidine Gluconate Cloth  6 each Topical Q0600  . clopidogrel  75 mg Per Tube Daily  . diphenoxylate-atropine  2 tablet Oral Q8H  . dorzolamide-timolol  1 drop Both Eyes BID  . ferrous sulfate  300 mg Per Tube QODAY  . free water  300 mL Per Tube Q4H  . heparin injection (subcutaneous)  5,000 Units Subcutaneous Q8H  . hydrALAZINE  50 mg Per Tube Q8H  . insulin aspart  0-9 Units Subcutaneous Q4H  . insulin glargine  5 Units Subcutaneous BID  . mouth rinse  15 mL Mouth Rinse q12n4p  . nystatin  5 mL Mouth/Throat QID  . pantoprazole sodium  40 mg Per Tube Daily  . scopolamine  1 patch Transdermal Q72H   . feeding supplement (NEPRO CARB STEADY) 50 mL/hr at 01/24/21 1311   acetaminophen, hydrALAZINE, metoprolol tartrate, ondansetron **OR** ondansetron (ZOFRAN) IV

## 2021-01-26 ENCOUNTER — Inpatient Hospital Stay (HOSPITAL_COMMUNITY): Payer: Medicare HMO

## 2021-01-26 DIAGNOSIS — G9341 Metabolic encephalopathy: Secondary | ICD-10-CM | POA: Diagnosis not present

## 2021-01-26 LAB — GLUCOSE, CAPILLARY
Glucose-Capillary: 79 mg/dL (ref 70–99)
Glucose-Capillary: 70 mg/dL (ref 70–99)
Glucose-Capillary: 67 mg/dL — ABNORMAL LOW (ref 70–99)
Glucose-Capillary: 109 mg/dL — ABNORMAL HIGH (ref 70–99)
Glucose-Capillary: 113 mg/dL — ABNORMAL HIGH (ref 70–99)
Glucose-Capillary: 121 mg/dL — ABNORMAL HIGH (ref 70–99)

## 2021-01-26 IMAGING — DX DG ABDOMEN 1V
1 series · 1 of 1 positions shown · non-contrast
Comparison: Abdominal x-ray from same day at [facility] 44 hours.

CLINICAL DATA: Feeding tube placement.

EXAM:
ABDOMEN - 1 VIEW

[abdomen kub]
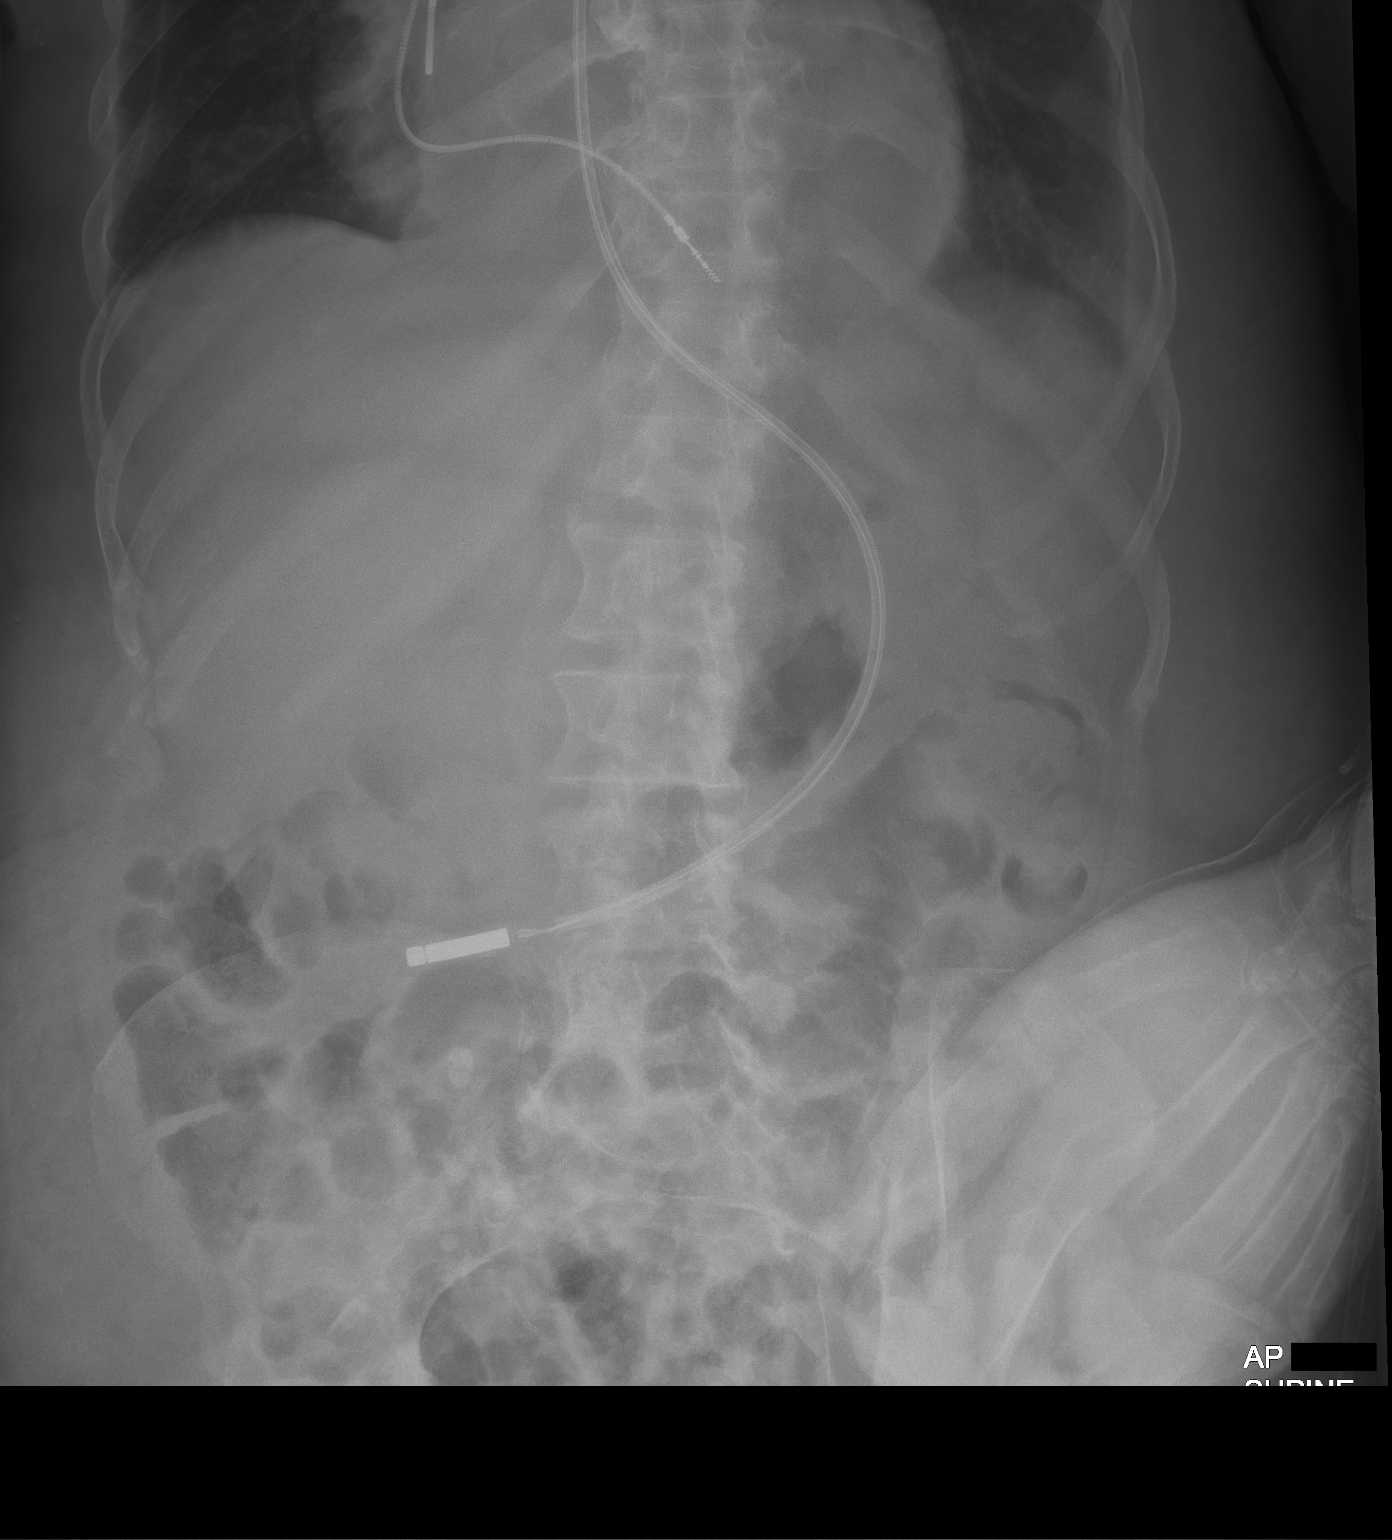

[1 of 1 positions shown; findings below may reference images not displayed]

FINDINGS: Tip of the feeding tube is in the distal stomach near the pylorus.
The bowel gas pattern is normal.
IMPRESSION: 1. Feeding tube tip in the distal stomach.

## 2021-01-26 IMAGING — DX DG ABDOMEN 1V
1 series · 1 of 1 positions shown · non-contrast
Comparison: To [WW] and earlier.

CLINICAL DATA: 74-year-old female feeding tube placement.

EXAM:
ABDOMEN - 1 VIEW

[abdomen kub]
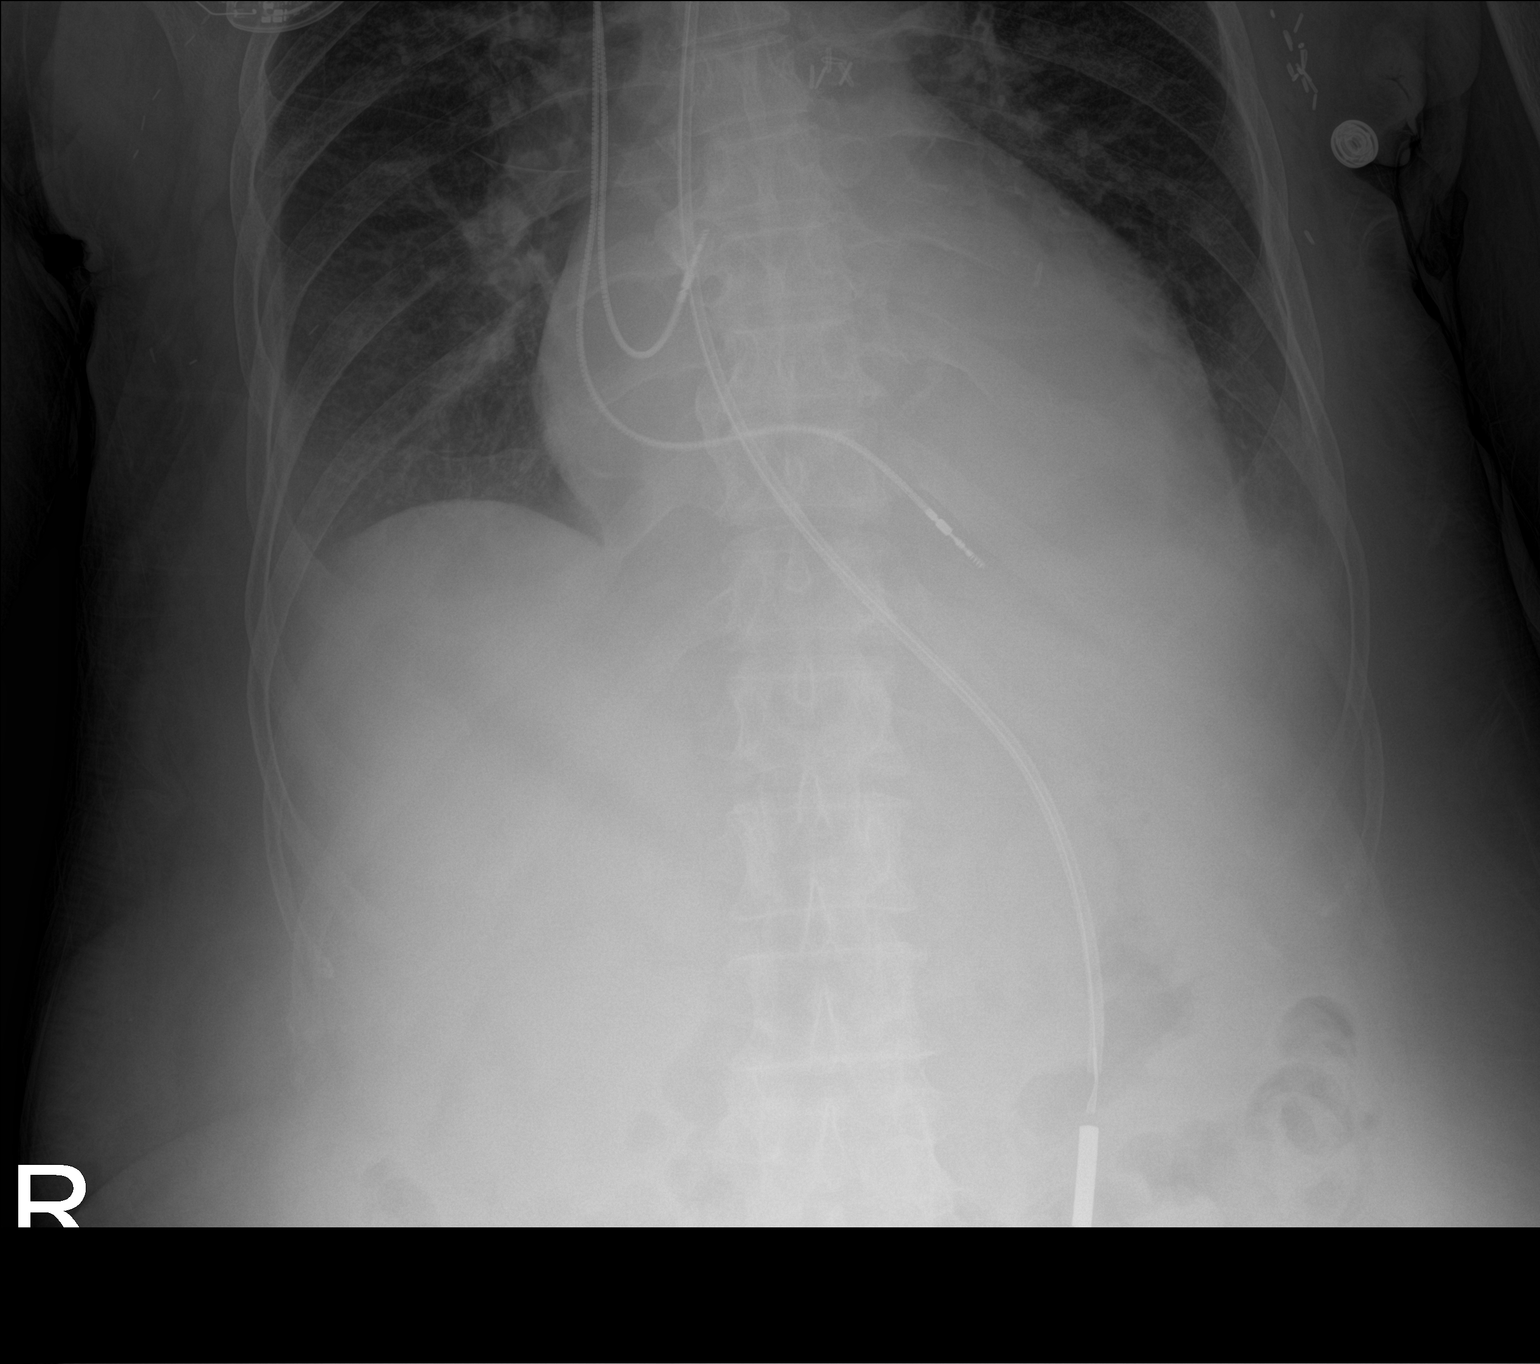

[1 of 1 positions shown; findings below may reference images not displayed]

FINDINGS: Portable AP semi upright view at [WW] hours. Enteric feeding tube
with guidewire in place terminates in the left lower abdomen
compatible with placement in the stomach, gastric ptosis. Negative
visible bowel gas pattern.

Stable cardiomegaly continued confluent retrocardiac opacity, mild
veiling lung base opacity. No acute osseous abnormality identified.
Stable chest wall surgical clips.
IMPRESSION: Enteric feeding tube tip placed into the stomach. Gastric ptosis
suspected. Advancement of roughly 9 cm would be necessary to allow
for post pyloric transit.

## 2021-01-26 IMAGING — DX DG ABDOMEN 1V
1 series · 1 of 1 positions shown · non-contrast
Comparison: Earlier film, same date.

CLINICAL DATA: Feeding tube placement.

EXAM:
ABDOMEN - 1 VIEW

[abdomen kub]
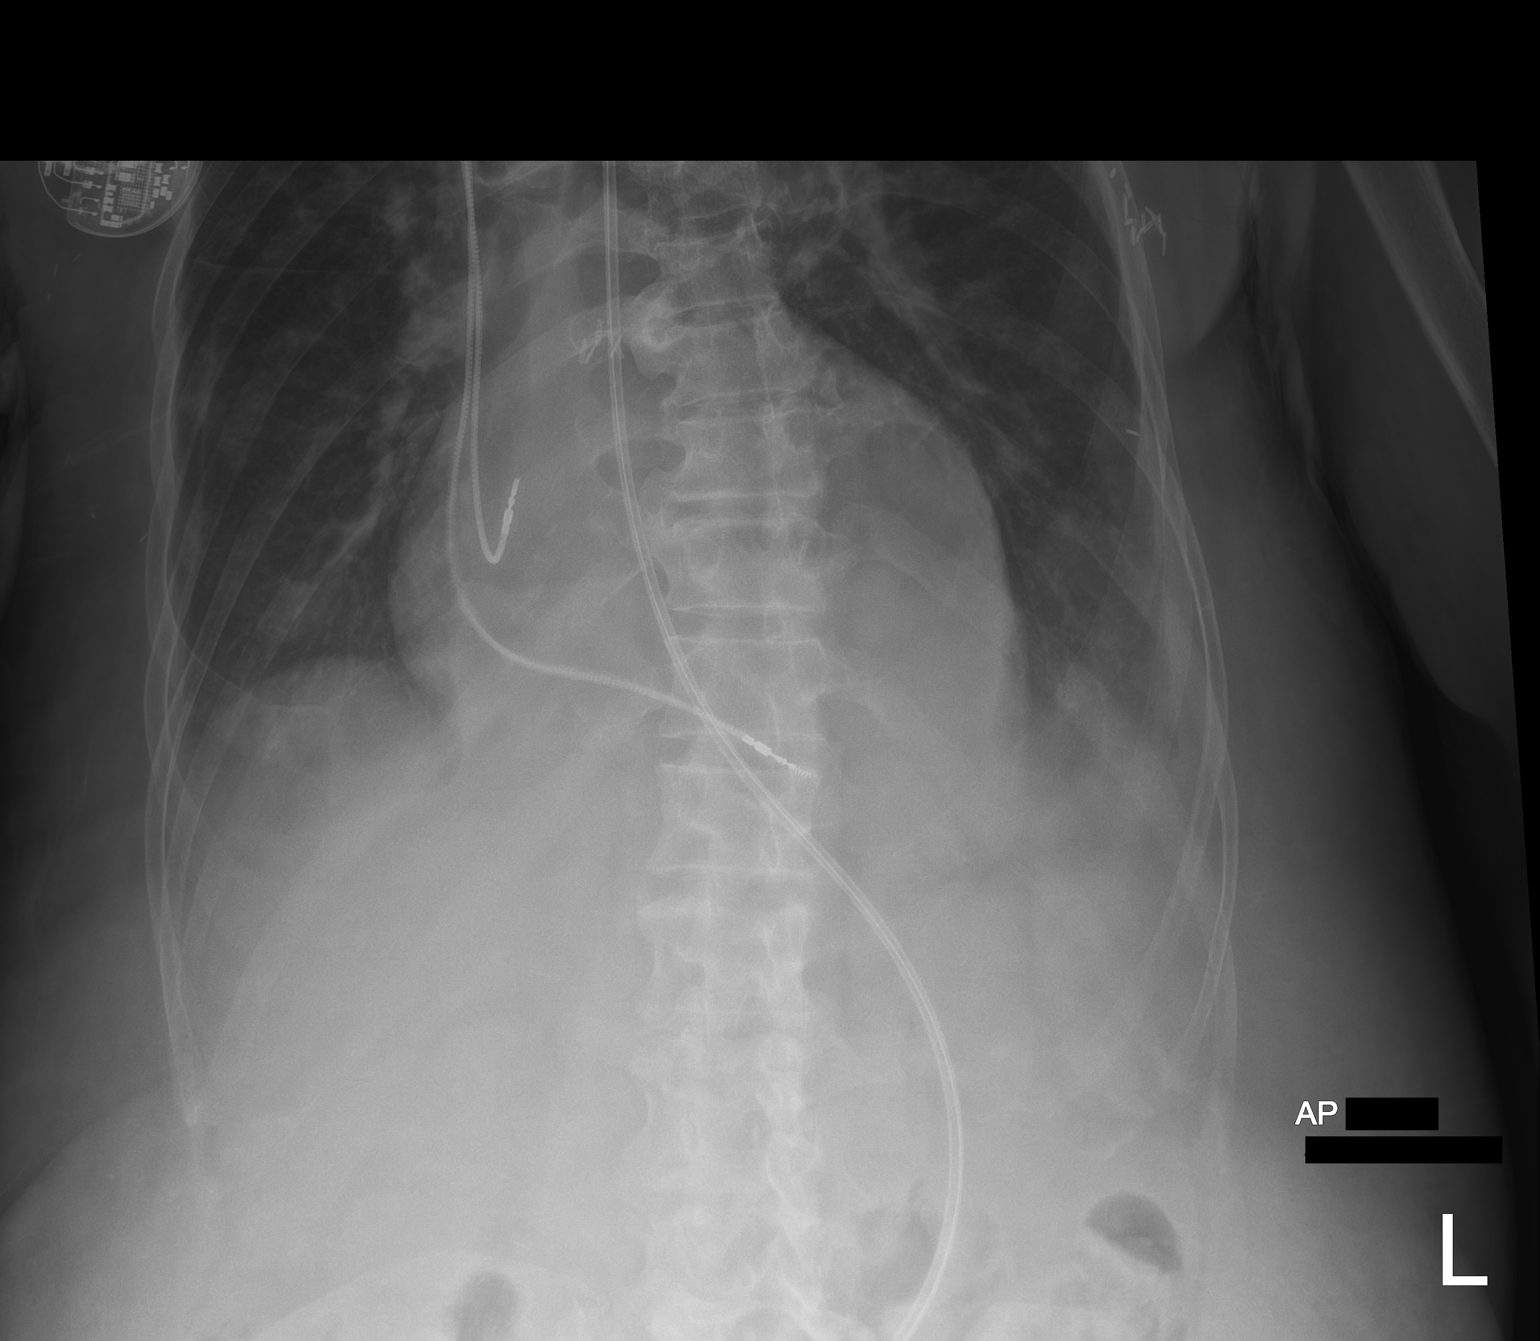

[1 of 1 positions shown; findings below may reference images not displayed]

FINDINGS: The tip of the feeding tube is not covered on this exam. The tube is
coursing down the esophagus and is at least in the body region of
the stomach. A lower film OB necessary to establish this exact
position.
IMPRESSION: The tip of the feeding tube is not included on this exam. A lower
abdominal film will be necessary.

## 2021-01-26 MED ORDER — DEXTROSE 50 % IV SOLN
INTRAVENOUS | Status: AC
  Administered 2021-01-26: 25 mL
  Filled 2021-01-26: qty 50

## 2021-01-26 MED ORDER — FUROSEMIDE 10 MG/ML IJ SOLN
20.0000 mg | Freq: Every day | INTRAMUSCULAR | Status: DC
  Administered 2021-01-26 – 2021-02-01 (×7): 20 mg via INTRAVENOUS
  Filled 2021-01-26 (×7): qty 2

## 2021-01-26 NOTE — Progress Notes (Signed)
Feeding tube replaced awaiting placement results. (65 cm) Pt tolerated well.

## 2021-01-26 NOTE — Progress Notes (Signed)
Patient's sister updated at the bedside on plan of care.

## 2021-01-26 NOTE — Progress Notes (Signed)
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   She was started on Remdesivir.  There was initial concern for angioedema.  PCCM was consulted but did not feel that she required intubation.  MRI brain unremarkable for acute change. Hospitalization complicated by continued encephalopathy. -Patient has had some mild improvements encephalopathy over the past several days Nephrology was consulted and at this time do not feel she is a great candidate for dialysis and have recommended DNR may be hospice Speech therapy has been asked to reevaluate the patient and see if she is a candidate for feeding otherwise will need ongoing discussions with palliative care regarding feeds and potential PEG tube placement  Assessment/Plan: Principal Problem:   Acute metabolic encephalopathy Active Problems:   Insulin dependent type 2 diabetes mellitus ([place])   Hypertension   History of stroke   CKD (chronic kidney disease), stage IV ([place])   COVID-19 virus infection   Hypernatremia  Improving acute metabolic encephalopathy secondary to AKI Covid hypernatremia -At baseline, patient is conversant per sister.  Able to walk with walker at baseline.  -Her mental status is gradually and slowly improving -CT head x2 without acute intracranial abnormality-EEG negative,-MRI brain without acute intracranial abnormality  -Dr. [facility]ly [facility] on 01/08/21, recommended treating the treatable, hypernatremia, AKI, COVID-19 viral infection.  Acute on chronic anemia of chronic disease Baseline Hg appears to be 9.8 Received 1 unit PRBC on  01/19/2021 Drop of hemoglobin down to 6.7 on 01/20/2021. Received another unit of blood on 01/20/2021 to maintain hemoglobin greater than 7.0. FOBT negative. No overt bleeding.  Anasarca secondary to hypoalbuminemia Albumin 1.9 on 01/20/2021 1 dose of IV Lasix 40 mg daily given on 01/20/2021. Repeating Lasix 20 mg IV daily from 2/13 Elevate extremities as able.  Severe dysphagia post cortrak placement. Appreciate speech therapist evaluation Cortrack in place HOB 30 degrees, increase free water to 300 every 4 hours given hypernatremia -She is removed core track x2 between 212, 2/13 -Cortrak in place for nutrition, failed swallow evaluation--- speech therapy have been asked to reevaluate the patient for feasibility of feeding otherwise may need to discuss PEG tube placement  Nonoliguric AKI on CKD stage IV likely prerenal in the setting of dehydration and poor oral intake. -Baseline creatinine 2.6, no evidence of hydronephrosis on renal ultrasound 01/10/2021. Creatinine trending 2.8-2.9 range currently 2.6 Net +3 L -Nephrology evaluated the patient 2/11 feel patient is very poor candidate for dialysis and have discussed this with family -We will asked that palliative care follow-up after speech therapy sees regarding next steps  COVID-19 viral infection -Tested positive on 01/02/21 -Remdesivir x 3 doses -Supportive treatments as ordered, encourage IS, encourage prone positioning, encourage mobilization as able. -Currently on room air with O2 saturation 99%.  Hypernatremia/Hyperkalemia Increased frequency of free water to 300 every 4 instead Potassium remains elevated Feeds changed to Nepro which has less potassium-recheck a.m. labs  Possible angioedema -Resolved, patient maintaining airway.  CT neck reassuring without significant swelling  HTN BP is not at goal Increase hydralazine dose to 50 mg 3 times daily. Coreg 6.25 mg twice daily. IV hydralazine as needed with  parameters.  Chronic diastolic CHF -EF 17-51%  -Cautious  intermittent use of diuretics Continue to monitor strict I's and O's and daily weight. Net I&O 4 L and we may have to back down off of free water in the next several days  History of stroke -Diagnosed October 2021, with residual left-sided weakness and expressive aphasia -Continue Plavix, Lipitor via tube feeding -PT has recommended SNF  Resolved fever No fevers recorded in the last 24 hours.  Goals of care Very poor prognosis in the setting of history of stroke, persistent encephalopathy, severe dysphagia, CKD, generalized weakness, recent COVID-19 infection, hypoalbuminemia with anasarca, anemia of chronic disease requiring blood transfusion, and advanced age. -Palliative care meeting sought given poor likely prognosis overall this was discussed with family by Dr. [place] Crane -Family contemplating next steps and as they are more than 9 family members-we will need to discuss if a PEG tube should be placed   DVT prophylaxis:  SQ heparin 3 times daily Place and maintain sequential compression device Start: 01/02/21 2308 Code Status: Full code Family Communication:  Called and updated Sister [name] [phone] on 01/25/2021 in great detail and expressed to her what our thoughts are SHE wants to give a chance for recovery Have encouraged her to think carefully over several days about next steps as we do not have a clear disposition for the patient as her facility may not be able to accept the patient with a feeding tube Family is undecided at this time about PEG tube placement and I have encouraged them to think over these options carefully as this does not reduce the risk of aspiration  Status is: Inpatient  Dispo: The patient is from: Home.              Anticipated d/c is to: SNF.              Anticipated d/c date is: 01/24/2021.               Patient currently not stable for discharge due to persistent altered mental status and  Cortrak dependent.  Patient will be difficult  place   Subjective  Patient removed the feeding tube x2 I reviewed the film and think can be fed She is more coherent today interactive can tell me where she is I spent a reasonable amount of time with her trying to understand if she understood the implications of not being able to eat-she tells me she would want a tube placed in her belly if she could not eat regularly    Objective: Vitals:   01/26/21 0500 01/26/21 0613 01/26/21 1204 01/26/21 1336  BP:  132/87 (!) 167/63 (!) 157/67  Pulse:  60 60   Resp:  20 16   Temp:  (!) 97.5 F (36.4 C) (!) 97.5 F (36.4 C)   TempSrc:  Axillary Oral   SpO2:  100%    Weight: 82.2 kg     Height:        Intake/Output Summary (Last 24 hours) at 01/26/2021 1507 Last data filed at 01/26/2021 1346 Gross per 24 hour  Intake 950 ml  Output 1325 ml  Net -375 ml   Filed Weights   01/24/21 0610 01/25/21 0400 01/26/21 0500  Weight: 81.7 kg 83 kg 82.2 kg    Exam:   Interactive more verbal today Poor exam but chest is clear S1-S2 no murmur Patient has underlying anasarca Following some commands now power 5/5 she is in mittens however   Data Reviewed: CBC: Recent Labs  Lab 01/20/21 0338 01/20/21 1103 01/21/21 0422 01/22/21  0425 01/23/21 0425 01/24/21 0420 01/25/21 0429  WBC 5.1  --  4.5 4.2 3.7* 3.6* 3.7*  NEUTROABS 3.7  --   --  3.1 2.3 2.6 2.6  HGB 7.4*   < > 8.1* 8.0* 8.0* 8.2* 7.8*  HCT 24.9*   < > 26.9* 26.5* 27.4* 28.3* 26.6*  MCV 95.8  --  97.1 95.7 99.3 99.6 99.3  PLT 175  --  173 129* 151 129* 127*   < > = values in this interval not displayed.   Basic Metabolic Panel: Recent Labs  Lab 01/20/21 0338 01/21/21 0422 01/22/21 0425 01/23/21 0425 01/24/21 0420 01/25/21 0429  NA 148* 150* 150* 151* 149* 146*  K 4.9 5.0 5.4* 5.1 5.0 5.3*  CL 120* 119* 121* 120* 120* 118*  CO2 20* 22 23 22 22 22   GLUCOSE 245* 221* 197* 111* 135* 136*  BUN 59* 61* 59* 59* 60* 64*   CREATININE 2.81* 2.94* 2.67* 2.80* 2.63* 2.59*  CALCIUM 8.0* 8.6* 8.5* 8.5* 8.9 8.7*  MG 2.3  --  2.3  --   --   --   PHOS 3.6  --  3.5  --   --   --    GFR: Estimated Creatinine Clearance: 20.4 mL/min (A) (by C-G formula based on SCr of 2.59 mg/dL (H)). Liver Function Tests: Recent Labs  Lab 01/20/21 0338 01/22/21 0425 01/23/21 0425 01/24/21 0420 01/25/21 0429  AST 34 41 34 47* 30  ALT 71* 82* 70* 77* 60*  ALKPHOS 107 110 106 121 121  BILITOT 0.4 0.8 0.4 0.5 0.5  PROT 5.3* 5.7* 5.8* 6.2* 6.1*  ALBUMIN 1.9* 2.1* 2.1* 2.2* 2.3*   No results for input(s): LIPASE, AMYLASE in the last 168 hours. No results for input(s): AMMONIA in the last 168 hours. Coagulation Profile: No results for input(s): INR, PROTIME in the last 168 hours. Cardiac Enzymes: No results for input(s): CKTOTAL, CKMB, CKMBINDEX, TROPONINI in the last 168 hours. BNP (last 3 results) No results for input(s): PROBNP in the last 8760 hours. HbA1C: No results for input(s): HGBA1C in the last 72 hours. CBG: Recent Labs  Lab 01/25/21 2338 01/26/21 0403 01/26/21 0821 01/26/21 1201 01/26/21 1241  GLUCAP 127* 79 70 67* 109*   Lipid Profile: No results for input(s): CHOL, HDL, LDLCALC, TRIG, CHOLHDL, LDLDIRECT in the last 72 hours. Thyroid Function Tests: No results for input(s): TSH, T4TOTAL, FREET4, T3FREE, THYROIDAB in the last 72 hours. Anemia Panel: No results for input(s): VITAMINB12, FOLATE, FERRITIN, TIBC, IRON, RETICCTPCT in the last 72 hours. Urine analysis:    Component Value Date/Time   COLORURINE YELLOW 01/02/2021 2053   APPEARANCEUR CLEAR 01/02/2021 2053   LABSPEC 1.014 01/02/2021 2053   PHURINE 5.0 01/02/2021 2053   GLUCOSEU 50 (A) 01/02/2021 2053   HGBUR NEGATIVE 01/02/2021 2053   BILIRUBINUR NEGATIVE 01/02/2021 2053   KETONESUR NEGATIVE 01/02/2021 2053   PROTEINUR >=300 (A) 01/02/2021 2053   NITRITE NEGATIVE 01/02/2021 2053   LEUKOCYTESUR NEGATIVE 01/02/2021 2053   Sepsis  Labs: @LABRCNTIP (procalcitonin:4,lacticidven:4)  )No results found for this or any previous visit (from the past 240 hour(s)).    Studies: DG Abd 1 View  Result Date: 01/26/2021 CLINICAL DATA:  Feeding tube placement. EXAM: ABDOMEN - 1 VIEW COMPARISON:  Abdominal x-ray from same day at [facility] hours. FINDINGS: Tip of the feeding tube is in the distal stomach near the pylorus. The bowel gas pattern is normal. IMPRESSION: 1. Feeding tube tip in the distal stomach. Electronically Signed   By:  Titus Dubin M.D.   On: 01/26/2021 12:11   DG Abd 1 View  Result Date: 01/26/2021 CLINICAL DATA:  Feeding tube placement. EXAM: ABDOMEN - 1 VIEW COMPARISON:  Earlier film, same date. FINDINGS: The tip of the feeding tube is not covered on this exam. The tube is coursing down the esophagus and is at least in the body region of the stomach. A lower film OB necessary to establish this exact position. IMPRESSION: The tip of the feeding tube is not included on this exam. A lower abdominal film will be necessary. Electronically Signed   By: Marijo Sanes M.D.   On: 01/26/2021 09:02   DG Abd 1 View  Result Date: 01/26/2021 CLINICAL DATA:  75 year old female feeding tube placement. EXAM: ABDOMEN - 1 VIEW COMPARISON:  To 1122 and earlier. FINDINGS: Portable AP semi upright view at 0509 hours. Enteric feeding tube with guidewire in place terminates in the left lower abdomen compatible with placement in the stomach, gastric ptosis. Negative visible bowel gas pattern. Stable cardiomegaly continued confluent retrocardiac opacity, mild veiling lung base opacity. No acute osseous abnormality identified. Stable chest wall surgical clips. IMPRESSION: Enteric feeding tube tip placed into the stomach. Gastric ptosis suspected. Advancement of roughly 9 cm would be necessary to allow for post pyloric transit. Electronically Signed   By: Genevie Ann M.D.   On: 01/26/2021 05:54    Scheduled Meds: . atorvastatin  80 mg Per Tube Daily   . carvedilol  6.25 mg Per NG tube BID WC  . chlorhexidine  15 mL Mouth Rinse BID  . Chlorhexidine Gluconate Cloth  6 each Topical Q0600  . clopidogrel  75 mg Per Tube Daily  . dorzolamide-timolol  1 drop Both Eyes BID  . ferrous sulfate  300 mg Per Tube QODAY  . free water  300 mL Per Tube Q4H  . heparin injection (subcutaneous)  5,000 Units Subcutaneous Q8H  . hydrALAZINE  50 mg Per Tube Q8H  . insulin aspart  0-9 Units Subcutaneous Q4H  . insulin glargine  5 Units Subcutaneous BID  . mouth rinse  15 mL Mouth Rinse q12n4p  . nystatin  5 mL Mouth/Throat QID  . pantoprazole sodium  40 mg Per Tube Daily    Continuous Infusions: . feeding supplement (NEPRO CARB STEADY) 50 mL/hr at 01/26/21 1318     LOS: 23 days   Verneita Griffes, MD [facility] 3:07 PM   If 7PM-7AM, please contact night-coverage [email or web address] Password [facility] 01/26/2021, 3:07 PM

## 2021-01-26 NOTE — Progress Notes (Signed)
Feeding tube is at (74 cm) after being advanced 9cm per previous KUB results, follow-up KUB placed. Pt tolerated well.

## 2021-01-26 NOTE — Plan of Care (Signed)
  Problem: Education: °Goal: Knowledge of General Education information will improve °Description: Including pain rating scale, medication(s)/side effects and non-pharmacologic comfort measures °Outcome: Progressing °  °Problem: Health Behavior/Discharge Planning: °Goal: Ability to manage health-related needs will improve °Outcome: Progressing °  °Problem: Clinical Measurements: °Goal: Ability to maintain clinical measurements within normal limits will improve °Outcome: Progressing °Goal: Will remain free from infection °Outcome: Progressing °Goal: Diagnostic test results will improve °Outcome: Progressing °Goal: Respiratory complications will improve °Outcome: Progressing °Goal: Cardiovascular complication will be avoided °Outcome: Progressing °  °Problem: Activity: °Goal: Risk for activity intolerance will decrease °Outcome: Progressing °  °Problem: Nutrition: °Goal: Adequate nutrition will be maintained °Outcome: Progressing °  °Problem: Coping: °Goal: Level of anxiety will decrease °Outcome: Progressing °  °Problem: Elimination: °Goal: Will not experience complications related to bowel motility °Outcome: Progressing °Goal: Will not experience complications related to urinary retention °Outcome: Progressing °  °Problem: Pain Managment: °Goal: General experience of comfort will improve °Outcome: Progressing °  °Problem: Safety: °Goal: Ability to remain free from injury will improve °Outcome: Progressing °  °Problem: Skin Integrity: °Goal: Risk for impaired skin integrity will decrease °Outcome: Progressing °  °Problem: Education: °Goal: Knowledge of risk factors and measures for prevention of condition will improve °Outcome: Progressing °  °Problem: Coping: °Goal: Psychosocial and spiritual needs will be supported °Outcome: Progressing °  °Problem: Respiratory: °Goal: Will maintain a patent airway °Outcome: Progressing °Goal: Complications related to the disease process, condition or treatment will be avoided or  minimized °Outcome: Progressing °  °Problem: Education: °Goal: Knowledge of risk factors and measures for prevention of condition will improve °Outcome: Progressing °  °Problem: Coping: °Goal: Psychosocial and spiritual needs will be supported °Outcome: Progressing °  °Problem: Respiratory: °Goal: Will maintain a patent airway °Outcome: Progressing °Goal: Complications related to the disease process, condition or treatment will be avoided or minimized °Outcome: Progressing °  °

## 2021-01-26 NOTE — Progress Notes (Signed)
Notified Dr. Verlon Au of patient's right arm edema and firm to touch with limited movement.

## 2021-01-26 NOTE — Progress Notes (Signed)
CRITICAL VALUE ALERT  Critical Value: Hypoglycemic Event  CBG:67  Treatment: D50 25 mL (12.5 gm)  Symptoms: None  Follow-up CBG: Time:1242 CBG Result:109  Possible Reasons for Event: Inadequate meal intake  Comments/MD notified:Dr. Margreta Journey, Larence Penning

## 2021-01-27 ENCOUNTER — Inpatient Hospital Stay (HOSPITAL_COMMUNITY): Payer: Medicare HMO

## 2021-01-27 DIAGNOSIS — M7989 Other specified soft tissue disorders: Secondary | ICD-10-CM

## 2021-01-27 DIAGNOSIS — G9341 Metabolic encephalopathy: Secondary | ICD-10-CM | POA: Diagnosis not present

## 2021-01-27 LAB — COMPREHENSIVE METABOLIC PANEL
ALT: 58 U/L — ABNORMAL HIGH (ref 0–44)
AST: 32 U/L (ref 15–41)
Albumin: 2.3 g/dL — ABNORMAL LOW (ref 3.5–5.0)
Alkaline Phosphatase: 143 U/L — ABNORMAL HIGH (ref 38–126)
Anion gap: 11 (ref 5–15)
BUN: 63 mg/dL — ABNORMAL HIGH (ref 8–23)
CO2: 22 mmol/L (ref 22–32)
Calcium: 8.6 mg/dL — ABNORMAL LOW (ref 8.9–10.3)
Chloride: 111 mmol/L (ref 98–111)
Creatinine, Ser: 2.46 mg/dL — ABNORMAL HIGH (ref 0.44–1.00)
GFR, Estimated: 20 mL/min — ABNORMAL LOW (ref >60)
Glucose, Bld: 149 mg/dL — ABNORMAL HIGH (ref 70–99)
Potassium: 4.6 mmol/L (ref 3.5–5.1)
Sodium: 144 mmol/L (ref 135–145)
Total Bilirubin: 0.6 mg/dL (ref 0.3–1.2)
Total Protein: 6.4 g/dL — ABNORMAL LOW (ref 6.5–8.1)

## 2021-01-27 LAB — CBC WITH DIFFERENTIAL/PLATELET
Abs Immature Granulocytes: 0.11 10*3/uL — ABNORMAL HIGH (ref 0.00–0.07)
Basophils Absolute: 0 10*3/uL (ref 0.0–0.1)
Basophils Relative: 1 %
Eosinophils Absolute: 0.1 10*3/uL (ref 0.0–0.5)
Eosinophils Relative: 4 %
HCT: 27.1 % — ABNORMAL LOW (ref 36.0–46.0)
Hemoglobin: 8.2 g/dL — ABNORMAL LOW (ref 12.0–15.0)
Immature Granulocytes: 3 %
Lymphocytes Relative: 16 %
Lymphs Abs: 0.5 10*3/uL — ABNORMAL LOW (ref 0.7–4.0)
MCH: 28.8 pg (ref 26.0–34.0)
MCHC: 30.3 g/dL (ref 30.0–36.0)
MCV: 95.1 fL (ref 80.0–100.0)
Monocytes Absolute: 0.3 10*3/uL (ref 0.1–1.0)
Monocytes Relative: 8 %
Neutro Abs: 2.3 10*3/uL (ref 1.7–7.7)
Neutrophils Relative %: 68 %
Platelets: 134 10*3/uL — ABNORMAL LOW (ref 150–400)
RBC: 2.85 MIL/uL — ABNORMAL LOW (ref 3.87–5.11)
RDW: 14.9 % (ref 11.5–15.5)
WBC: 3.3 10*3/uL — ABNORMAL LOW (ref 4.0–10.5)
nRBC: 0 % (ref 0.0–0.2)

## 2021-01-27 LAB — GLUCOSE, CAPILLARY
Glucose-Capillary: 121 mg/dL — ABNORMAL HIGH (ref 70–99)
Glucose-Capillary: 121 mg/dL — ABNORMAL HIGH (ref 70–99)
Glucose-Capillary: 130 mg/dL — ABNORMAL HIGH (ref 70–99)
Glucose-Capillary: 145 mg/dL — ABNORMAL HIGH (ref 70–99)
Glucose-Capillary: 147 mg/dL — ABNORMAL HIGH (ref 70–99)
Glucose-Capillary: 152 mg/dL — ABNORMAL HIGH (ref 70–99)
Glucose-Capillary: 96 mg/dL (ref 70–99)

## 2021-01-27 NOTE — Plan of Care (Signed)
  Problem: Education: °Goal: Knowledge of General Education information will improve °Description: Including pain rating scale, medication(s)/side effects and non-pharmacologic comfort measures °Outcome: Progressing °  °Problem: Health Behavior/Discharge Planning: °Goal: Ability to manage health-related needs will improve °Outcome: Progressing °  °Problem: Clinical Measurements: °Goal: Ability to maintain clinical measurements within normal limits will improve °Outcome: Progressing °Goal: Will remain free from infection °Outcome: Progressing °Goal: Diagnostic test results will improve °Outcome: Progressing °Goal: Respiratory complications will improve °Outcome: Progressing °Goal: Cardiovascular complication will be avoided °Outcome: Progressing °  °Problem: Activity: °Goal: Risk for activity intolerance will decrease °Outcome: Progressing °  °Problem: Nutrition: °Goal: Adequate nutrition will be maintained °Outcome: Progressing °  °Problem: Coping: °Goal: Level of anxiety will decrease °Outcome: Progressing °  °Problem: Elimination: °Goal: Will not experience complications related to bowel motility °Outcome: Progressing °Goal: Will not experience complications related to urinary retention °Outcome: Progressing °  °Problem: Pain Managment: °Goal: General experience of comfort will improve °Outcome: Progressing °  °Problem: Safety: °Goal: Ability to remain free from injury will improve °Outcome: Progressing °  °Problem: Skin Integrity: °Goal: Risk for impaired skin integrity will decrease °Outcome: Progressing °  °Problem: Education: °Goal: Knowledge of risk factors and measures for prevention of condition will improve °Outcome: Progressing °  °Problem: Coping: °Goal: Psychosocial and spiritual needs will be supported °Outcome: Progressing °  °Problem: Respiratory: °Goal: Will maintain a patent airway °Outcome: Progressing °Goal: Complications related to the disease process, condition or treatment will be avoided or  minimized °Outcome: Progressing °  °Problem: Education: °Goal: Knowledge of risk factors and measures for prevention of condition will improve °Outcome: Progressing °  °Problem: Coping: °Goal: Psychosocial and spiritual needs will be supported °Outcome: Progressing °  °Problem: Respiratory: °Goal: Will maintain a patent airway °Outcome: Progressing °Goal: Complications related to the disease process, condition or treatment will be avoided or minimized °Outcome: Progressing °  °

## 2021-01-27 NOTE — Progress Notes (Signed)
PROGRESS NOTE  Vermont Sandberg WSF:[id number] DOB: Sep 25, 1946 DOA: 01/02/2021 PCP: [name], FNP  HPI/Recap of past 73 hours: 75 year old female long-term [facility] Previous stroke 11/02/2020 residual left-sided weakness, expressive aphasia, dementia, CKD stage IV, complete heart block status post pacemaker in the past Breast cancer in remission Diabetes  admit  from [facility] 01/03/2021 Hypernatremic +, COVID-19 +.   She was started on Remdesivir.  There was initial concern for angioedema.  PCCM was consulted but did not feel that she required intubation.  MRI brain unremarkable for acute change. Hospitalization complicated by continued encephalopathy. -Patient has had some mild improvements encephalopathy over the past several days Nephrology was consulted and at this time do not feel she is a great candidate for dialysis and have recommended DNR may be hospice Speech therapy has been asked to reevaluate the patient and see if she is a candidate for feeding otherwise will need ongoing discussions with palliative care regarding feeds and potential PEG tube placement  Assessment/Plan: Principal Problem:   Acute metabolic encephalopathy Active Problems:   Insulin dependent type 2 diabetes mellitus ([place])   Hypertension   History of stroke   CKD (chronic kidney disease), stage IV ([place])   COVID-19 virus infection   Hypernatremia  Improving acute metabolic encephalopathy secondary to AKI Covid hypernatremia -At baseline, patient is conversant per sister.  Able to walk with walker at baseline.  -Her mental status is gradually and slowly improving -CT head x2 without acute intracranial abnormality-EEG negative,-MRI brain without acute intracranial abnormality  -Dr. [facility] on 01/08/21, recommended treating the treatable, hypernatremia, AKI, COVID-19 viral infection.  Acute right upper extremity swelling Get stat ultrasound upper extremity-initially felt to be anasarca  related however swelling is more pronounced despite giving low-dose Lasix yesterday  Acute on chronic anemia of chronic disease Baseline Hg appears to be 9.8 Received 1 unit PRBC on 01/19/2021 Drop of hemoglobin down to 6.7 on 01/20/2021. Received another unit of blood on 01/20/2021 to maintain hemoglobin greater than 7.0. FOBT negative. No overt bleeding.  Anasarca secondary to hypoalbuminemia Albumin 1.9 on 01/20/2021 1 dose of IV Lasix 40 mg daily given on 01/20/2021. Repeating Lasix 20 mg IV daily from 2/13 Elevate extremities as able.  Severe dysphagia post cortrak placement. Appreciate speech therapist evaluation Cortrack in place HOB 30 degrees, increase free water to 300 every 4 hours given hypernatremia -She is removed core track x2 between 212, 2/13 -Cortrak in place for nutrition, failed swallow evaluation--- speech therapy have been asked to reevaluate the patient for feasibility of feeding otherwise may need to discuss PEG tube placement  Nonoliguric AKI on CKD stage IV likely prerenal in the setting of dehydration and poor oral intake. -Baseline creatinine 2.6, no evidence of hydronephrosis on renal ultrasound 01/10/2021. Creatinine trending 2.8-2.9 range currently 2.4 Net + 3.3 L -Nephrology evaluated the patient 2/11 feel patient is very poor candidate for dialysis and have discussed this with family -We will asked that palliative care follow-up after speech therapy sees regarding next steps  COVID-19 viral infection -Tested positive on 01/02/21 -Remdesivir x 3 doses -Supportive treatments as ordered, encourage IS, encourage prone positioning, encourage mobilization as able. -Currently on room air with O2 saturation 99%.  Hypernatremia/Hyperkalemia Increased frequency of free water to 300 every 4 instead Potassium remains elevated Feeds changed to Nepro which has less potassium-periodic labs  Possible angioedema -Resolved, patient maintaining airway.  CT neck reassuring  without significant swelling  HTN BP is not at goal Increase hydralazine  dose to 50 mg 3 times daily. Coreg 6.25 mg twice daily. IV hydralazine as needed with parameters.  Chronic diastolic CHF -EF 62-13%  -Cautious intermittent use of diuretics Continue to monitor strict I's and O's and daily weight. Net I&O 4 L and we may have to back down off of free water in the next several days  History of stroke -Diagnosed October 2021, with residual left-sided weakness and expressive aphasia -Continue Plavix, Lipitor via tube feeding -PT has recommended SNF  Resolved fever No fevers recorded in the last 24 hours.  Goals of care Very poor prognosis in the setting of history of stroke, persistent encephalopathy, severe dysphagia, CKD, generalized weakness, recent COVID-19 infection, hypoalbuminemia with anasarca, anemia of chronic disease requiring blood transfusion, and advanced age. -Palliative care meeting sought given poor likely prognosis overall this was discussed with family by Dr. [place] Crane -Family contemplating next steps and as they are more than 9 family members-we will need to discuss if a PEG tube should be placed   DVT prophylaxis:  SQ heparin 3 times daily Place and maintain sequential compression device Start: 01/02/21 2308 Code Status: Full code Family Communication:  Called  Sister [name] [phone] on 01/27/21 but was unable to reach her   Status is: Inpatient  Dispo: The patient is from: Home.              Anticipated d/c is to: SNF.              Anticipated d/c date is: 01/31/2021.               Patient currently not stable for discharge due to persistent altered mental status and Cortrak dependent.  Patient will be difficult to place   Subjective  Continues to improve and become more coherent Again voiced to me would wish to have feeding tube placed if could not tolerate or graduate up on diet Speech therapy input reviewed in detail I called her sister but  did not receive a response today  Objective: Vitals:   01/26/21 2005 01/27/21 0401 01/27/21 0500 01/27/21 1201  BP: (!) 157/68 (!) 149/87  (!) 166/62  Pulse: 61 66  (!) 59  Resp: 18 20  20   Temp: (!) 97.4 F (36.3 C) 97.7 F (36.5 C)  97.8 F (36.6 C)  TempSrc: Axillary Oral  Oral  SpO2: 100% 99%  97%  Weight:   81.8 kg   Height:        Intake/Output Summary (Last 24 hours) at 01/27/2021 1302 Last data filed at 01/27/2021 0900 Gross per 24 hour  Intake 2008.34 ml  Output 2025 ml  Net -16.66 ml   Filed Weights   01/25/21 0400 01/26/21 0500 01/27/21 0500  Weight: 83 kg 82.2 kg 81.8 kg    Exam:   More interactive more verbal, NG tube in place Decreased posterior lateral air entry S1-S2 no murmur Patient has underlying anasarca Right upper extremity is very swollen Following some commands now  power 5/5 although somewhat limited on the right upper extremity She has wound protection on and lower extremities and her heels   Data Reviewed: CBC: Recent Labs  Lab 01/22/21 0425 01/23/21 0425 01/24/21 0420 01/25/21 0429 01/27/21 0845  WBC 4.2 3.7* 3.6* 3.7* 3.3*  NEUTROABS 3.1 2.3 2.6 2.6 2.3  HGB 8.0* 8.0* 8.2* 7.8* 8.2*  HCT 26.5* 27.4* 28.3* 26.6* 27.1*  MCV 95.7 99.3 99.6 99.3 95.1  PLT 129* 151 129* 127* 295*   Basic Metabolic Panel: Recent Labs  Lab 01/22/21 0425 01/23/21 0425 01/24/21 0420 01/25/21 0429 01/27/21 0845  NA 150* 151* 149* 146* 144  K 5.4* 5.1 5.0 5.3* 4.6  CL 121* 120* 120* 118* 111  CO2 23 22 22 22 22   GLUCOSE 197* 111* 135* 136* 149*  BUN 59* 59* 60* 64* 63*  CREATININE 2.67* 2.80* 2.63* 2.59* 2.46*  CALCIUM 8.5* 8.5* 8.9 8.7* 8.6*  MG 2.3  --   --   --   --   PHOS 3.5  --   --   --   --    GFR: Estimated Creatinine Clearance: 21.4 mL/min (A) (by C-G formula based on SCr of 2.46 mg/dL (H)). Liver Function Tests: Recent Labs  Lab 01/22/21 0425 01/23/21 0425 01/24/21 0420 01/25/21 0429 01/27/21 0845  AST 41 34 47* 30 32   ALT 82* 70* 77* 60* 58*  ALKPHOS 110 106 121 121 143*  BILITOT 0.8 0.4 0.5 0.5 0.6  PROT 5.7* 5.8* 6.2* 6.1* 6.4*  ALBUMIN 2.1* 2.1* 2.2* 2.3* 2.3*   No results for input(s): LIPASE, AMYLASE in the last 168 hours. No results for input(s): AMMONIA in the last 168 hours. Coagulation Profile: No results for input(s): INR, PROTIME in the last 168 hours. Cardiac Enzymes: No results for input(s): CKTOTAL, CKMB, CKMBINDEX, TROPONINI in the last 168 hours. BNP (last 3 results) No results for input(s): PROBNP in the last 8760 hours. HbA1C: No results for input(s): HGBA1C in the last 72 hours. CBG: Recent Labs  Lab 01/26/21 2000 01/27/21 0002 01/27/21 0356 01/27/21 0751 01/27/21 1157  GLUCAP 121* 145* 152* 147* 121*   Lipid Profile: No results for input(s): CHOL, HDL, LDLCALC, TRIG, CHOLHDL, LDLDIRECT in the last 72 hours. Thyroid Function Tests: No results for input(s): TSH, T4TOTAL, FREET4, T3FREE, THYROIDAB in the last 72 hours. Anemia Panel: No results for input(s): VITAMINB12, FOLATE, FERRITIN, TIBC, IRON, RETICCTPCT in the last 72 hours. Urine analysis:    Component Value Date/Time   COLORURINE YELLOW 01/02/2021 2053   APPEARANCEUR CLEAR 01/02/2021 2053   LABSPEC 1.014 01/02/2021 2053   PHURINE 5.0 01/02/2021 2053   GLUCOSEU 50 (A) 01/02/2021 2053   HGBUR NEGATIVE 01/02/2021 2053   BILIRUBINUR NEGATIVE 01/02/2021 2053   KETONESUR NEGATIVE 01/02/2021 2053   PROTEINUR >=300 (A) 01/02/2021 2053   NITRITE NEGATIVE 01/02/2021 2053   LEUKOCYTESUR NEGATIVE 01/02/2021 2053   Sepsis Labs: @LABRCNTIP (procalcitonin:4,lacticidven:4)  )No results found for this or any previous visit (from the past 240 hour(s)).    Studies: No results found.  Scheduled Meds: . atorvastatin  80 mg Per Tube Daily  . carvedilol  6.25 mg Per NG tube BID WC  . chlorhexidine  15 mL Mouth Rinse BID  . Chlorhexidine Gluconate Cloth  6 each Topical Q0600  . clopidogrel  75 mg Per Tube Daily  .  dorzolamide-timolol  1 drop Both Eyes BID  . ferrous sulfate  300 mg Per Tube QODAY  . free water  300 mL Per Tube Q4H  . furosemide  20 mg Intravenous Daily  . heparin injection (subcutaneous)  5,000 Units Subcutaneous Q8H  . hydrALAZINE  50 mg Per Tube Q8H  . insulin aspart  0-9 Units Subcutaneous Q4H  . insulin glargine  5 Units Subcutaneous BID  . mouth rinse  15 mL Mouth Rinse q12n4p  . nystatin  5 mL Mouth/Throat QID  . pantoprazole sodium  40 mg Per Tube Daily    Continuous Infusions: . feeding supplement (NEPRO CARB STEADY) 50 mL/hr at 01/26/21 1318  LOS: 24 days   Verneita Griffes, MD [facility] 1:02 PM   If 7PM-7AM, please contact night-coverage [email or web address] Password [facility] 01/27/2021, 1:02 PM

## 2021-01-27 NOTE — Progress Notes (Signed)
Orthopedic Tech Progress Note Patient Details:  Desiree Pollard 11-24-1946 [id number]  Ortho Devices Type of Ortho Device: Arm sling,Sling arm elevator Ortho Device/Splint Location: right Ortho Device/Splint Interventions: Application,Adjustment   Post Interventions Patient Tolerated: Well Instructions Provided: Care of device   Desiree Pollard 01/27/2021, 5:07 PM

## 2021-01-27 NOTE — Progress Notes (Signed)
Right Upper ext duplex completed    Please see CV Proc for preliminary results.   Vonzell Schlatter, RVT

## 2021-01-27 NOTE — Progress Notes (Signed)
  Speech Language Pathology Treatment: Dysphagia  Patient Details Name: Desiree Pollard MRN: [id number] DOB: May 14, 1946 Today's Date: 01/27/2021 Time: 1100-1110 SLP Time Calculation (min) (ACUTE ONLY): 10 min  Assessment / Plan / Recommendation Clinical Impression  Patient seen to address dysphagia goals with trials of 1/2 teaspoon sips water and 1/4 teaspoon bites of gelatin. Patient exhibited frequent oral holding of boluses requring maximal cues for initiating swallow. In addition, she exhibited laryngeal pumping and suspect delayed swallow initiation. During initial spoon sip of water, patient exhibited immediate explosive cough response which included eyes watering, wet vocal quality and patient appearing in significant discomfort. After taking a short break, SLP then gave patient three more spoon sips of water and two small bites of gelatin. She did not exhibit any further coughing or throat clearing. SLP plans to complete more PO trials next date and complete MBS to objectively assess patient's swallow function.   HPI HPI: Pt is a 75 yo female adm to [facility] with acute metabolic encephalopathy - COVID +, has h/o CVAs x2 - last October 2021, DM, CKD4, dementia, breast cancer.  Imaging of brain previously has shown Stable appearance of right frontal encephalomalacia and bilateral  lacunar type infarcts in the basal ganglia and cerebellar  hemispheres. . Expected evolution of encephalomalacia along the right  inferior cerebellum corresponding with a presumed subacute infarct  on comparison CT.  3. Chronic microvascular angiopathy and parenchymal volume loss.  CVAs have left pt with aphasia and left weakness per Admit note.  Most recent CXR concerning for potential infectious process.  Swallow eval ordered and pt has not been able to consume po intake due to dysphagia and mentation. She has pulled out 2 Dobbhoff tubes in the past 24 to 48 hours (2/11-2/13). MD requesting SLE to determine if patient is  cognitively more ready for PO's, due to patient with reported improved alertness, speech clarity.      SLP Plan  Continue with current plan of care;MBS  Patient needs continued Speech Lanaguage Pathology Services    Recommendations  Diet recommendations: NPO Medication Administration: Via alternative means                Oral Care Recommendations: Oral care QID Follow up Recommendations: 24 hour supervision/assistance;Skilled Nursing facility SLP Visit Diagnosis: Dysphagia, unspecified (R13.10) Plan: Continue with current plan of care;MBS                [name], MA, CCC-SLP Speech Therapy

## 2021-01-27 NOTE — Evaluation (Signed)
Speech Language Pathology Evaluation Patient Details Name: Desiree Pollard MRN: [id number] DOB: 27-Apr-1946 Today's Date: 01/27/2021 Time: 1045-1100 SLP Time Calculation (min) (ACUTE ONLY): 15 min  Problem List:  Patient Active Problem List   Diagnosis Date Noted  . COVID-19 virus infection 01/02/2021  . Hypernatremia 01/02/2021  . 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 CKD (chronic kidney disease), stage IV ([place]) 11/26/2020  . Acute metabolic encephalopathy 26/83/4196  . Generalized weakness 11/24/2020  . Acute lower UTI 11/24/2020  . Hypothermia 11/24/2020  . ARF (acute renal failure) ([place]) 10/19/2020  . Dizziness 10/19/2020  . Thrombocytopenia ([place]) 10/19/2020  . Chronic diarrhea 09/18/2020  . Loss of weight 09/18/2020  . Dysphagia 09/18/2020  . Insulin dependent type 2 diabetes mellitus ([place]) 06/16/2020  . Hemoglobin A1c less than 7.0% 06/16/2020  . Hyperglycemia 06/16/2020  . Hypertension 06/16/2020  . History of stroke 06/16/2020   Past Medical History:  Past Medical History:  Diagnosis Date  . Breast cancer ([place])    2017  . CVA (cerebral vascular accident) ([place]) 11/2018  . Dehydration 07/2020  . Diabetes mellitus without complication ([place])   . Diabetic retinopathy ([place])    PDR OU  . Frequent diarrhea 07/2020  . Hyperkalemia 07/2020  . Hypertensive retinopathy    OU  . Stroke [facility])    Past Surgical History:  Past Surgical History:  Procedure Laterality Date  . CATARACT EXTRACTION Right   . MASTECTOMY Bilateral    "2017" Left Mastectomy; "2019" Right Mastectomy  . PACEMAKER IMPLANT  2017   HPI:  Pt is a 75 yo female adm to [facility] with acute metabolic encephalopathy - COVID +, has h/o CVAs x2 - last October 2021, DM, CKD4, dementia, breast cancer.  Imaging of brain previously has shown Stable appearance of right frontal encephalomalacia and bilateral  lacunar type infarcts in the basal ganglia and cerebellar  hemispheres. . Expected evolution of  encephalomalacia along the right  inferior cerebellum corresponding with a presumed subacute infarct  on comparison CT.  3. Chronic microvascular angiopathy and parenchymal volume loss.  CVAs have left pt with aphasia and left weakness per Admit note.  Most recent CXR concerning for potential infectious process.  Swallow eval ordered and pt has not been able to consume po intake due to dysphagia and mentation. She has pulled out 2 Dobbhoff tubes in the past 24 to 48 hours (2/11-2/13). MD requesting SLE to determine if patient is cognitively more ready for PO's, due to patient with reported improved alertness, speech clarity.   Assessment / Plan / Recommendation Clinical Impression  Patient presents with a moderate cognitive-linguistic impairment and moderate flaccid dysarthria. Patient oriented to self, place (generally "hospital) but not time or situation. She did not initiate any verbalizations but would respond to SLP's direct questioning and follow basic level commands. Speech is more intelligible to this SLP since last time he saw her (1/31) and is not exhibiting the tongue protrustion to the extent she had been.  Although patient would alert easily, she frequently would start to close her eyes throughout evaluation.    SLP Assessment  SLP Recommendation/Assessment: Patient needs continued Speech Lanaguage Pathology Services SLP Visit Diagnosis: Cognitive communication deficit (R41.841);Dysarthria and anarthria (R47.1)    Follow Up Recommendations  24 hour supervision/assistance;Skilled Nursing facility    Frequency and Duration min 2x/week  1 week      SLP Evaluation Cognition  Overall Cognitive Status: Impaired/Different from baseline Arousal/Alertness: Awake/alert Orientation Level: Oriented to person;Oriented to place;Disoriented to  time;Disoriented to situation Attention: Focused;Sustained Focused Attention: Appears intact (focused attention adequate for PO intake trials) Sustained  Attention: Impaired Sustained Attention Impairment: Verbal basic;Functional basic Memory: Impaired Memory Impairment: Decreased recall of new information;Storage deficit;Retrieval deficit Awareness: Impaired Awareness Impairment: Intellectual impairment Executive Function: Initiating Initiating: Impaired Initiating Impairment: Verbal basic;Functional basic Safety/Judgment: Impaired       Comprehension  Auditory Comprehension Overall Auditory Comprehension: Impaired Yes/No Questions: Not tested Commands: Impaired One Step Basic Commands: 50-74% accurate Conversation: Simple Interfering Components: Attention;Processing speed EffectiveTechniques: Extra processing time;Repetition;Visual/Gestural cues Visual Recognition/Discrimination Discrimination: Not tested Reading Comprehension Reading Status: Not tested    Expression Expression Primary Mode of Expression: Verbal Verbal Expression Overall Verbal Expression: Impaired Initiation: Impaired Automatic Speech: Name Level of Generative/Spontaneous Verbalization: Word;Phrase Naming: Not tested Interfering Components: Attention Non-Verbal Means of Communication: Not applicable   Oral / Motor  Oral Motor/Sensory Function Overall Oral Motor/Sensory Function: Moderate impairment Facial Strength: Reduced right;Reduced left Lingual ROM: Reduced right;Reduced left Lingual Strength: Reduced;Suspected CN XII (hypoglossal) dysfunction Motor Speech Overall Motor Speech: Impaired Respiration: Within functional limits Phonation: Normal Resonance: Within functional limits Articulation: Impaired Level of Impairment: Word Intelligibility: Intelligibility reduced Word: 50-74% accurate Phrase: 25-49% accurate Sentence: Not tested Conversation: Not tested Motor Planning: Witnin functional limits Motor Speech Errors: Not applicable Effective Techniques: Slow rate;Increased vocal intensity   GO                   [name], MA,  CCC-SLP Speech Therapy

## 2021-01-28 DIAGNOSIS — R1310 Dysphagia, unspecified: Secondary | ICD-10-CM | POA: Diagnosis not present

## 2021-01-28 DIAGNOSIS — R4701 Aphasia: Secondary | ICD-10-CM | POA: Diagnosis not present

## 2021-01-28 DIAGNOSIS — N184 Chronic kidney disease, stage 4 (severe): Secondary | ICD-10-CM | POA: Diagnosis not present

## 2021-01-28 DIAGNOSIS — G9341 Metabolic encephalopathy: Secondary | ICD-10-CM | POA: Diagnosis not present

## 2021-01-28 LAB — GLUCOSE, CAPILLARY
Glucose-Capillary: 138 mg/dL — ABNORMAL HIGH (ref 70–99)
Glucose-Capillary: 155 mg/dL — ABNORMAL HIGH (ref 70–99)
Glucose-Capillary: 143 mg/dL — ABNORMAL HIGH (ref 70–99)
Glucose-Capillary: 130 mg/dL — ABNORMAL HIGH (ref 70–99)
Glucose-Capillary: 152 mg/dL — ABNORMAL HIGH (ref 70–99)

## 2021-01-28 NOTE — Progress Notes (Signed)
   Daily Progress Note   Patient Name: Desiree Pollard       Date: 01/28/2021 DOB: [birth year]  Age: 75 y.o. MRN#: [id number] Attending Physician: Nita Sells, MD Primary Care Physician: [name], FNP Admit Date: 01/02/2021  Reason for Consultation/Follow-up: Establishing goals of care  Subjective: Chart Reviewed. Updates Received.   Patient showing some signs of improvement including mentation and ability to take in some oral intake with precautions.   Spoke with sister Desiree Pollard. She reports her and family's appreciation of patient's improvement as she has visited with patient recently. She shares patient is slowing reaching her baseline.   We discussed at length previously discussed goals of care. Desiree Pollard reports continued discussions amongst family and they continue to remain hopeful. Sister is aware of continued involvement from the SLP team to provide updates regarding patient's progress including recommendations.   We discussed at length PEG placement including education on pros and cons. Desiree Pollard reports family would like to continue with watchful waiting with hopes of improvement eliminating the need for PEG tube placement decisions. She does state if PEG needed family most likely would like to pursue to allow patient an opportunity to further improve.   Desiree Pollard confirms wishes for full code/full scope care.   All questions answered and support provided.   Length of Stay: 25 days  Vital Signs: BP (!) 188/87 (BP Location: Left Arm)   Pulse 71   Temp 97.9 F (36.6 C) (Axillary)   Resp 19   Ht 5\' 7"  (1.702 m)   Wt 84.4 kg   SpO2 97%   BMI 29.14 kg/m  SpO2: SpO2: 97 % O2 Device: O2 Device: Room Air O2 Flow Rate:         Palliative Care Assessment & Plan   Goals of Care/Recommendations:  Full Code/Full Scope Care  Continue to treat the treatable  Family remains hopeful for continued improvement/stability. Patient is showing some signs of  improvement including mentation which family appreciates.   Watchful waiting. Sister reports would like to hold off on PEG to allow patient the opportunity to eat on her own. States if PEG is needed family would want to at least attempt to allow patient opportunity to continue to improve/thrive if possible   Outpatient Palliative support at discharge.   PMT will continue to support and follow as needed. Please call for urgent needs.   Prognosis: Guarded  Discharge Planning: To Be Determined  Thank you for allowing the Palliative Medicine Team to assist in the care of this patient.  Time Total: 40 min.   Visit consisted of counseling and education dealing with the complex and emotionally intense issues of symptom management and palliative care in the setting of serious and potentially life-threatening illness.Greater than 50%  of this time was spent counseling and coordinating care related to the above assessment and plan.  Alda Lea, AGPCNP-BC  Palliative Medicine Team [phone]

## 2021-01-28 NOTE — Progress Notes (Signed)
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   She was started on Remdesivir.  There was initial concern for angioedema.  PCCM was consulted but did not feel that she required intubation.  MRI brain unremarkable for acute change. Hospitalization complicated by continued encephalopathy. -Patient has had some mild improvements encephalopathy over the past several days Nephrology was consulted and at this time do not feel she is a great candidate for dialysis and have recommended DNR may be hospice Speech therapy has been asked to reevaluate the patient and see if she is a candidate for feeding  It is doubtful that the patient will be able to keep up with nutrition therefore we will tentatively ask for calorie count before moving to PEG tube placement  Assessment/Plan: Principal Problem:   Acute metabolic encephalopathy Active Problems:   Insulin dependent type 2 diabetes mellitus ([place])   Hypertension   History of stroke   CKD (chronic kidney disease), stage IV ([place])   COVID-19 virus infection   Hypernatremia  Improving acute metabolic encephalopathy secondary to AKI Covid hypernatremia -At baseline, patient is conversant per sister.  Able to walk with walker at baseline.  -Her mental status is gradually and slowly improving -CT head x2 without acute intracranial abnormality-EEG negative,-MRI brain without acute intracranial abnormality  -Dr. Curly [place] on 01/08/21, recommended treating the treatable, hypernatremia, AKI, COVID-19 viral infection.  Acute right upper extremity swelling Get stat  ultrasound upper extremity-initially felt to be anasarca related however swelling is more pronounced despite giving low-dose Lasix yesterday Elevate the arm  Acute on chronic anemia of chronic disease Baseline Hg appears to be 9.8 Received 1 unit PRBC on 01/19/2021 Drop of hemoglobin down to 6.7 on 01/20/2021. Received another unit of blood on 01/20/2021 to maintain hemoglobin greater than 7.0. FOBT negative. No overt bleeding.  Anasarca secondary to hypoalbuminemia Albumin 1.9 on 01/20/2021 1 dose of IV Lasix 40 mg daily given on 01/20/2021. Repeating Lasix 20 mg IV daily from 2/13 Elevate extremities as able.  Severe dysphagia post cortrak placement. Appreciate speech therapist evaluation Cortrack in place HOB 30 degrees, increase free water to 300 every 4 hours given hypernatremia -She is removed core track x2 between 212, 2/13 -Cortrak in place for nutrition, failed swallow evaluation--- speech therapy reevaluating and she has better swallowing mechanism today 2/15 -Hold PEG tube placement We will ask for calorie count and see if she can meet her nutritional needs over the next 5 to 7 days  Nonoliguric AKI on CKD stage IV likely prerenal in the setting of dehydration and poor oral intake. -Baseline creatinine 2.6, no evidence of hydronephrosis on renal ultrasound 01/10/2021. Creatinine trending 2.8-2.9 range currently 2.4 Net + 3.3 L -Nephrology evaluated the patient 2/11 feel patient is very poor candidate for dialysis and have discussed this with family -Patient's family wishes full scope of care  COVID-19 viral infection -Tested positive on 01/02/21 -Remdesivir x 3 doses -Supportive treatments as ordered, encourage IS, encourage prone positioning, encourage mobilization as able. -Currently on room air with O2 saturation 99%.  Hypernatremia/Hyperkalemia Increased frequency of free water to 300 every 4 instead Potassium remains elevated Feeds changed to Nepro which has less  potassium-periodic labs  Possible angioedema -Resolved, patient maintaining airway.  CT neck reassuring without significant swelling  HTN Continuing hydralazine dose to 50 mg 3 times daily. Coreg 6.25 mg twice daily. IV hydralazine as needed with parameters.  Chronic diastolic CHF -EF 66-06%  -Cautious intermittent use of diuretics Continue to monitor strict I's and O's and daily weight. Net I&O 4 L and we may have to back down off of free water in the next several days  History of stroke -Diagnosed October 2021, with residual left-sided weakness and expressive aphasia -Continue Plavix, Lipitor via tube feeding -PT has recommended SNF  Resolved fever No fevers recorded in the last 24 hours.  Goals of care Very poor prognosis in the setting of history of stroke, persistent encephalopathy, severe dysphagia, CKD, generalized weakness, recent COVID-19 infection, hypoalbuminemia with anasarca, anemia of chronic disease requiring blood transfusion, and advanced age. -Palliative care meeting sought given poor likely prognosis overall this was discussed with family by Dr. [place] Crane -Family have discussed with attending physician plan and wish full scope of care especially in the sense of her getting better   DVT prophylaxis:  SQ heparin 3 times daily Place and maintain sequential compression device Start: 01/02/21 2308 Code Status: Full code Family Communication:  Called  Sister [name] [phone] the next steps are to increase diet as per speech therapy and hold PEG tube for now and reassess later this week with calorie count if she is able to keep up with nutrition   Status is: Inpatient  Dispo: The patient is from: Home.              Anticipated d/c is to: SNF.              Anticipated d/c date is: 01/31/2021.               Patient currently not stable for discharge due to persistent altered mental status and Cortrak dependent.  Patient will be difficult to  place   Subjective  Continues to improve and become more coherent Minimally moves lower extremities but can raise upper extremities No pain  Objective: Vitals:   01/27/21 1856 01/27/21 2048 01/28/21 0531 01/28/21 1321  BP: (!) 165/67 (!) 175/65 (!) 188/87 (!) 156/66  Pulse:  62 71 61  Resp:   19 18  Temp:  (!) 97.5 F (36.4 C) 97.9 F (36.6 C) (!) 97.4 F (36.3 C)  TempSrc:  Oral Axillary Oral  SpO2:  98% 97% 100%  Weight:   84.4 kg   Height:        Intake/Output Summary (Last 24 hours) at 01/28/2021 1503 Last data filed at 01/28/2021 1330 Gross per 24 hour  Intake 1500 ml  Output 1775 ml  Net -275 ml   Filed Weights   01/26/21 0500 01/27/21 0500 01/28/21 0531  Weight: 82.2 kg 81.8 kg 84.4 kg    Exam:   More interactive verbal no distress EOMI NCAT Significantly swollen right upper extremity Abdomen soft no rebound no guarding Poor motion to lower extremities ROM intact otherwise Did not examine lower back   Data Reviewed: CBC: Recent Labs  Lab 01/22/21 0425 01/23/21 0425 01/24/21 0420 01/25/21 0429 01/27/21 0845  WBC 4.2 3.7* 3.6* 3.7* 3.3*  NEUTROABS 3.1 2.3 2.6 2.6 2.3  HGB 8.0* 8.0* 8.2* 7.8* 8.2*  HCT 26.5* 27.4* 28.3* 26.6* 27.1*  MCV 95.7 99.3 99.6 99.3 95.1  PLT 129* 151 129* 127* 355*   Basic Metabolic Panel: Recent Labs  Lab 01/22/21  0425 01/23/21 0425 01/24/21 0420 01/25/21 0429 01/27/21 0845  NA 150* 151* 149* 146* 144  K 5.4* 5.1 5.0 5.3* 4.6  CL 121* 120* 120* 118* 111  CO2 23 22 22 22 22   GLUCOSE 197* 111* 135* 136* 149*  BUN 59* 59* 60* 64* 63*  CREATININE 2.67* 2.80* 2.63* 2.59* 2.46*  CALCIUM 8.5* 8.5* 8.9 8.7* 8.6*  MG 2.3  --   --   --   --   PHOS 3.5  --   --   --   --    GFR: Estimated Creatinine Clearance: 21.7 mL/min (A) (by C-G formula based on SCr of 2.46 mg/dL (H)). Liver Function Tests: Recent Labs  Lab 01/22/21 0425 01/23/21 0425 01/24/21 0420 01/25/21 0429 01/27/21 0845  AST 41 34 47* 30 32  ALT  82* 70* 77* 60* 58*  ALKPHOS 110 106 121 121 143*  BILITOT 0.8 0.4 0.5 0.5 0.6  PROT 5.7* 5.8* 6.2* 6.1* 6.4*  ALBUMIN 2.1* 2.1* 2.2* 2.3* 2.3*   No results for input(s): LIPASE, AMYLASE in the last 168 hours. No results for input(s): AMMONIA in the last 168 hours. Coagulation Profile: No results for input(s): INR, PROTIME in the last 168 hours. Cardiac Enzymes: No results for input(s): CKTOTAL, CKMB, CKMBINDEX, TROPONINI in the last 168 hours. BNP (last 3 results) No results for input(s): PROBNP in the last 8760 hours. HbA1C: No results for input(s): HGBA1C in the last 72 hours. CBG: Recent Labs  Lab 01/27/21 2051 01/27/21 2338 01/28/21 0535 01/28/21 0816 01/28/21 1137  GLUCAP 121* 130* 138* 155* 143*   Lipid Profile: No results for input(s): CHOL, HDL, LDLCALC, TRIG, CHOLHDL, LDLDIRECT in the last 72 hours. Thyroid Function Tests: No results for input(s): TSH, T4TOTAL, FREET4, T3FREE, THYROIDAB in the last 72 hours. Anemia Panel: No results for input(s): VITAMINB12, FOLATE, FERRITIN, TIBC, IRON, RETICCTPCT in the last 72 hours. Urine analysis:    Component Value Date/Time   COLORURINE YELLOW 01/02/2021 2053   APPEARANCEUR CLEAR 01/02/2021 2053   LABSPEC 1.014 01/02/2021 2053   PHURINE 5.0 01/02/2021 2053   GLUCOSEU 50 (A) 01/02/2021 2053   HGBUR NEGATIVE 01/02/2021 2053   BILIRUBINUR NEGATIVE 01/02/2021 2053   KETONESUR NEGATIVE 01/02/2021 2053   PROTEINUR >=300 (A) 01/02/2021 2053   NITRITE NEGATIVE 01/02/2021 2053   LEUKOCYTESUR NEGATIVE 01/02/2021 2053   Sepsis Labs: @LABRCNTIP (procalcitonin:4,lacticidven:4)  )No results found for this or any previous visit (from the past 240 hour(s)).    Studies: No results found.  Scheduled Meds: . atorvastatin  80 mg Per Tube Daily  . carvedilol  6.25 mg Per NG tube BID WC  . chlorhexidine  15 mL Mouth Rinse BID  . Chlorhexidine Gluconate Cloth  6 each Topical Q0600  . dorzolamide-timolol  1 drop Both Eyes BID  .  ferrous sulfate  300 mg Per Tube QODAY  . free water  300 mL Per Tube Q4H  . furosemide  20 mg Intravenous Daily  . heparin injection (subcutaneous)  5,000 Units Subcutaneous Q8H  . hydrALAZINE  50 mg Per Tube Q8H  . insulin aspart  0-9 Units Subcutaneous Q4H  . insulin glargine  5 Units Subcutaneous BID  . mouth rinse  15 mL Mouth Rinse q12n4p  . nystatin  5 mL Mouth/Throat QID  . pantoprazole sodium  40 mg Per Tube Daily    Continuous Infusions: . feeding supplement (NEPRO CARB STEADY) 50 mL/hr at 01/26/21 1318     LOS: 25 days   Verneita Griffes, MD  [facility] 3:03 PM   If 7PM-7AM, please contact night-coverage [email or web address] Password [facility] 01/28/2021, 3:03 PM

## 2021-01-28 NOTE — Progress Notes (Addendum)
Nutrition Follow-up  DOCUMENTATION CODES:   Not applicable  INTERVENTION:  - continue Nepro @ 50 ml/hr. - free water flush per MD.  - recommend PEG placement.  NUTRITION DIAGNOSIS:   Increased nutrient needs related to acute illness (COVID-19 infection) as evidenced by estimated needs. -ongoing  GOAL:   Patient will meet greater than or equal to 90% of their needs -met with TF regimen  MONITOR:   Diet advancement,Labs,Weight trends,TF tolerance,Skin,I & O's  ASSESSMENT:   75 year old female with past medical history significant for previous stroke with residual left-sided weakness 10/21, HTN, breast cancer 2017, expressive aphasia, dementia, CKD stage IV and diabetes who presents from her nursing facility due to altered mental status and was found to have hypernatremia as well as COVID-19.  She remains NPO since 1/21. SLP note from this afternoon recommends ongoing NPO status.  She has NGT in place; current tube placed 2/13 in R nare. She is receiving Nepro @ 50 ml/hr with 300 ml free water every 4 hours. This regimen is providing 2160 kcal, 97 grams protein, and 2672 ml free water.   Weight has been fairly stable over the past 1 week. Deep pitting edema to RUE and mild pitting edema to LUE and BLE documented in the edema section of flow sheet.   Per notes: - very poor prognosis - remains Full Code - anticipated d/c date of 2/18 to SNF    Labs reviewed; CBGs: 155 and 143 mg/dl,  Medications reviewed; 20 mg IV lasix/day, sliding scale novolog, 5 units lantus BID, 5 ml mycostatin QID, 40 mg protonix/day.    Diet Order:   Diet Order            Diet NPO time specified  Diet effective now                 EDUCATION NEEDS:   No education needs have been identified at this time  Skin:  Skin Assessment: Reviewed RN Assessment  Last BM:  2/15 (type 6 x1)  Height:   Ht Readings from Last 1 Encounters:  01/03/21 _0  (1.702 m)    Weight:   Wt Readings from  Last 1 Encounters:  01/28/21 84.4 kg    Estimated Nutritional Needs:  Kcal:  1800-2100kcal/day Protein:  90-105g/day Fluid:  1.6-1.8L/day      Jarome Matin, MS, RD, LDN, CNSC Inpatient Clinical Dietitian RD pager # available in AMION  After hours/weekend pager # available in [facility]

## 2021-01-28 NOTE — Plan of Care (Signed)
  Problem: Education: Goal: Knowledge of General Education information will improve Description: Including pain rating scale, medication(s)/side effects and non-pharmacologic comfort measures Outcome: Progressing   Problem: Health Behavior/Discharge Planning: Goal: Ability to manage health-related needs will improve Outcome: Progressing   Problem: Clinical Measurements: Goal: Ability to maintain clinical measurements within normal limits will improve Outcome: Progressing Goal: Will remain free from infection Outcome: Progressing Goal: Diagnostic test results will improve Outcome: Progressing Goal: Respiratory complications will improve Outcome: Progressing Goal: Cardiovascular complication will be avoided Outcome: Progressing   Problem: Nutrition: Goal: Adequate nutrition will be maintained Outcome: Progressing   Problem: Coping: Goal: Level of anxiety will decrease Outcome: Progressing   Problem: Elimination: Goal: Will not experience complications related to bowel motility Outcome: Progressing Goal: Will not experience complications related to urinary retention Outcome: Progressing   Problem: Pain Managment: Goal: General experience of comfort will improve Outcome: Progressing   Problem: Safety: Goal: Ability to remain free from injury will improve Outcome: Progressing   Problem: Skin Integrity: Goal: Risk for impaired skin integrity will decrease Outcome: Progressing   Problem: Education: Goal: Knowledge of risk factors and measures for prevention of condition will improve Outcome: Progressing

## 2021-01-28 NOTE — Plan of Care (Signed)
  Problem: Education: °Goal: Knowledge of General Education information will improve °Description: Including pain rating scale, medication(s)/side effects and non-pharmacologic comfort measures °Outcome: Progressing °  °Problem: Health Behavior/Discharge Planning: °Goal: Ability to manage health-related needs will improve °Outcome: Progressing °  °Problem: Clinical Measurements: °Goal: Ability to maintain clinical measurements within normal limits will improve °Outcome: Progressing °Goal: Will remain free from infection °Outcome: Progressing °Goal: Diagnostic test results will improve °Outcome: Progressing °Goal: Respiratory complications will improve °Outcome: Progressing °Goal: Cardiovascular complication will be avoided °Outcome: Progressing °  °Problem: Activity: °Goal: Risk for activity intolerance will decrease °Outcome: Progressing °  °Problem: Nutrition: °Goal: Adequate nutrition will be maintained °Outcome: Progressing °  °Problem: Coping: °Goal: Level of anxiety will decrease °Outcome: Progressing °  °Problem: Elimination: °Goal: Will not experience complications related to bowel motility °Outcome: Progressing °Goal: Will not experience complications related to urinary retention °Outcome: Progressing °  °Problem: Pain Managment: °Goal: General experience of comfort will improve °Outcome: Progressing °  °Problem: Safety: °Goal: Ability to remain free from injury will improve °Outcome: Progressing °  °Problem: Skin Integrity: °Goal: Risk for impaired skin integrity will decrease °Outcome: Progressing °  °Problem: Education: °Goal: Knowledge of risk factors and measures for prevention of condition will improve °Outcome: Progressing °  °Problem: Coping: °Goal: Psychosocial and spiritual needs will be supported °Outcome: Progressing °  °Problem: Respiratory: °Goal: Will maintain a patent airway °Outcome: Progressing °Goal: Complications related to the disease process, condition or treatment will be avoided or  minimized °Outcome: Progressing °  °Problem: Education: °Goal: Knowledge of risk factors and measures for prevention of condition will improve °Outcome: Progressing °  °Problem: Coping: °Goal: Psychosocial and spiritual needs will be supported °Outcome: Progressing °  °Problem: Respiratory: °Goal: Will maintain a patent airway °Outcome: Progressing °Goal: Complications related to the disease process, condition or treatment will be avoided or minimized °Outcome: Progressing °  °

## 2021-01-28 NOTE — Progress Notes (Signed)
  Speech Language Pathology Treatment: Dysphagia  Patient Details Name: Desiree Pollard MRN: [id number] DOB: 07-24-1946 Today's Date: 01/28/2021 Time: 3825-0539 SLP Time Calculation (min) (ACUTE ONLY): 22 min  Assessment / Plan / Recommendation Clinical Impression  Pt today seen for skilled SLP dysphagia treatment.  She awoke to SLP gentle verbal stimulation.  Lingual thrust observed less and pt responds to cues to seal lips. Articulation of speech is much improved indicating improved lingual strength.  SLP provided po trials of ice chips, nectar thick juice, and icecream.  She is able for suction on a straw today!   Latency ini swallow continues = initially 22 seconds with ice. However after pt "warmed up", she was triggering a swallow more efficiently. Swallow trigger varied from 5 seconds to 8 seocnds with liquids.  Thermal stimulation of icecream used to improve oral transtiing.  No indication of aspiration observed but delayed multiple swallows present.  Due to h/o frontal left CVA, oral initiation is more impaired and sensory input contributes greatly to swallow.  Pt requiring moderate cues to seal lips to decreased pressure loss and improve pharyngeal clearance.  Using teach back, pt educated to plan.     Recommend she have ice chips today after oral care and proceed with MBS next date.  Concern is present for nutritional ability with po alone, however feeding tube will not prevent aspiration.  Pt educated and agreeable to plan.  RN and MD agree.  HPI HPI: Pt is a 75 yo female adm to [facility] with acute metabolic encephalopathy - COVID +, has h/o CVAs x2 - last October 2021, DM, CKD4, dementia, breast cancer.  Imaging of brain previously has shown Stable appearance of right frontal encephalomalacia and bilateral  lacunar type infarcts in the basal ganglia and cerebellar  hemispheres. . Expected evolution of encephalomalacia along the right  inferior cerebellum corresponding with a presumed subacute  infarct  on comparison CT.  3. Chronic microvascular angiopathy and parenchymal volume loss.  CVAs have left pt with aphasia and left weakness per Admit note.  Most recent CXR concerning for potential infectious process.  Swallow eval ordered and pt has not been able to consume po intake due to dysphagia and mentation. She has pulled out 2 Dobbhoff tubes in the past 24 to 48 hours (2/11-2/13). MD requesting SLE to determine if patient is cognitively more ready for PO's, due to patient with reported improved alertness, speech clarity.      SLP Plan  Continue with current plan of care       Recommendations  Diet recommendations: NPO (ice chips) Medication Administration: Via alternative means                Oral Care Recommendations: Oral care QID;Oral care prior to ice chip/H20 Follow up Recommendations: 24 hour supervision/assistance SLP Visit Diagnosis: Dysphagia, unspecified (R13.10) Plan: Continue with current plan of care       GO                Desiree Pollard 01/28/2021, 3:36 PM   Desiree Lime, MS [place] Office [phone] Pager [phone]

## 2021-01-28 NOTE — TOC Progression Note (Addendum)
Transition of Care [facility]) - Progression Note    Patient Details  Name: Desiree Pollard MRN: [id number] Date of Birth: 1946/06/10  Transition of Care [facility]) CM/SW Contact  Ross Ludwig, [place] Phone Number: 01/28/2021, 5:23 PM  Clinical Narrative:     CSW continuing to follow patient's progress throughout discharge planning.  Patient still has a NG tube, possible peg tube if patient can tolerate diet per physician note.          Expected Discharge Plan and Services  Patient to return back to [facility] once medically ready.                                               Social Determinants of Health (SDOH) Interventions    Readmission Risk Interventions Readmission Risk Prevention Plan 01/13/2021  Transportation Screening Complete  PCP or Specialist Appt within 3-5 Days Complete  Social Work Consult for [place] Planning/Counseling Complete  Palliative Care Screening Complete  Medication Review [employer]) Referral to Pharmacy

## 2021-01-29 ENCOUNTER — Inpatient Hospital Stay (HOSPITAL_COMMUNITY): Payer: Medicare HMO

## 2021-01-29 DIAGNOSIS — Z8673 Personal history of transient ischemic attack (TIA), and cerebral infarction without residual deficits: Secondary | ICD-10-CM | POA: Diagnosis not present

## 2021-01-29 DIAGNOSIS — I5032 Chronic diastolic (congestive) heart failure: Secondary | ICD-10-CM

## 2021-01-29 DIAGNOSIS — G9341 Metabolic encephalopathy: Secondary | ICD-10-CM | POA: Diagnosis not present

## 2021-01-29 DIAGNOSIS — R601 Generalized edema: Secondary | ICD-10-CM

## 2021-01-29 DIAGNOSIS — R338 Other retention of urine: Secondary | ICD-10-CM

## 2021-01-29 DIAGNOSIS — N189 Chronic kidney disease, unspecified: Secondary | ICD-10-CM

## 2021-01-29 DIAGNOSIS — U071 COVID-19: Secondary | ICD-10-CM | POA: Diagnosis not present

## 2021-01-29 DIAGNOSIS — R1312 Dysphagia, oropharyngeal phase: Secondary | ICD-10-CM | POA: Diagnosis not present

## 2021-01-29 LAB — CBC WITH DIFFERENTIAL/PLATELET
Abs Immature Granulocytes: 0.08 10*3/uL — ABNORMAL HIGH (ref 0.00–0.07)
Basophils Absolute: 0 10*3/uL (ref 0.0–0.1)
Basophils Relative: 1 %
Eosinophils Absolute: 0.1 10*3/uL (ref 0.0–0.5)
Eosinophils Relative: 4 %
HCT: 25.5 % — ABNORMAL LOW (ref 36.0–46.0)
Hemoglobin: 8.1 g/dL — ABNORMAL LOW (ref 12.0–15.0)
Immature Granulocytes: 3 %
Lymphocytes Relative: 23 %
Lymphs Abs: 0.6 10*3/uL — ABNORMAL LOW (ref 0.7–4.0)
MCH: 28.6 pg (ref 26.0–34.0)
MCHC: 31.8 g/dL (ref 30.0–36.0)
MCV: 90.1 fL (ref 80.0–100.0)
Monocytes Absolute: 0.3 10*3/uL (ref 0.1–1.0)
Monocytes Relative: 10 %
Neutro Abs: 1.6 10*3/uL — ABNORMAL LOW (ref 1.7–7.7)
Neutrophils Relative %: 59 %
Platelets: 140 10*3/uL — ABNORMAL LOW (ref 150–400)
RBC: 2.83 MIL/uL — ABNORMAL LOW (ref 3.87–5.11)
RDW: 15 % (ref 11.5–15.5)
WBC: 2.6 10*3/uL — ABNORMAL LOW (ref 4.0–10.5)
nRBC: 0 % (ref 0.0–0.2)

## 2021-01-29 LAB — COMPREHENSIVE METABOLIC PANEL
ALT: 46 U/L — ABNORMAL HIGH (ref 0–44)
AST: 30 U/L (ref 15–41)
Albumin: 2.2 g/dL — ABNORMAL LOW (ref 3.5–5.0)
Alkaline Phosphatase: 142 U/L — ABNORMAL HIGH (ref 38–126)
Anion gap: 8 (ref 5–15)
BUN: 58 mg/dL — ABNORMAL HIGH (ref 8–23)
CO2: 24 mmol/L (ref 22–32)
Calcium: 8.7 mg/dL — ABNORMAL LOW (ref 8.9–10.3)
Chloride: 110 mmol/L (ref 98–111)
Creatinine, Ser: 2.35 mg/dL — ABNORMAL HIGH (ref 0.44–1.00)
GFR, Estimated: 21 mL/min — ABNORMAL LOW (ref >60)
Glucose, Bld: 92 mg/dL (ref 70–99)
Potassium: 4.3 mmol/L (ref 3.5–5.1)
Sodium: 142 mmol/L (ref 135–145)
Total Bilirubin: 0.5 mg/dL (ref 0.3–1.2)
Total Protein: 6.4 g/dL — ABNORMAL LOW (ref 6.5–8.1)

## 2021-01-29 LAB — GLUCOSE, CAPILLARY
Glucose-Capillary: 119 mg/dL — ABNORMAL HIGH (ref 70–99)
Glucose-Capillary: 121 mg/dL — ABNORMAL HIGH (ref 70–99)
Glucose-Capillary: 131 mg/dL — ABNORMAL HIGH (ref 70–99)
Glucose-Capillary: 134 mg/dL — ABNORMAL HIGH (ref 70–99)
Glucose-Capillary: 88 mg/dL (ref 70–99)
Glucose-Capillary: 90 mg/dL (ref 70–99)
Glucose-Capillary: 93 mg/dL (ref 70–99)

## 2021-01-29 MED ORDER — BETHANECHOL CHLORIDE 10 MG PO TABS
10.0000 mg | ORAL_TABLET | Freq: Three times a day (TID) | ORAL | Status: DC
  Administered 2021-01-29 – 2021-02-03 (×16): 10 mg via ORAL
  Filled 2021-01-29 (×17): qty 1

## 2021-01-29 MED ORDER — BETHANECHOL CHLORIDE 25 MG PO TABS
25.0000 mg | ORAL_TABLET | Freq: Once | ORAL | Status: AC
  Administered 2021-01-29: 25 mg via ORAL
  Filled 2021-01-29: qty 1

## 2021-01-29 NOTE — Progress Notes (Signed)
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  MRI brain negative for acute finding.  Hospital course complicated by AKI and dysphagia.  Nephrology did not feel patient is a candidate for HD and recommended DNR and possible hospice. She pulled out NGT twice but replaced losartan.  SLP following.  She may need PEG tube given family's desire to continue full scope of care.   Subjective: Seen and examined earlier this morning.  No major events overnight of this morning.  No complaints but not a great historian.  She is oriented to self and "hospital".  Responds no to pain.  Objective: Vitals:   01/28/21 2110 01/29/21 0500 01/29/21 0513 01/29/21 1349  BP: (!) 164/61  (!) 166/65 (!) 151/65  Pulse: 63  62 (!) 59  Resp: (!) 22  20 16   Temp: (!) 97.5 F (36.4 C)  (!) 97.3 F (36.3 C) (!) 97.4 F (36.3 C)  TempSrc: Oral  Oral   SpO2: 100%  100% 99%  Weight:  81.8 kg    Height:        Intake/Output Summary (Last 24 hours) at 01/29/2021 1519 Last data filed at 01/29/2021 1049 Gross per 24 hour  Intake 1500 ml  Output 1350 ml  Net 150 ml   Filed Weights   01/27/21 0500 01/28/21 0531 01/29/21 0500  Weight: 81.8 kg 84.4 kg 81.8 kg    Examination:  GENERAL: No apparent distress.  Nontoxic. HEENT: MMM.  Vision and hearing grossly intact.  NECK: Supple.  No apparent JVD.  RESP:  No IWOB.  Fair aeration bilaterally. CVS:  RRR. Heart sounds normal.  ABD/GI/GU: BS+. Abd soft, NTND.  MSK/EXT:  Moves extremities. No apparent deformity. No edema.  SKIN: no apparent skin lesion or wound NEURO: Wakes to voice.  Oriented to self and  "hospital".  Follows commands.  PSYCH: Calm.  No distress or agitation.  Procedures:  None  Microbiology summarized: 1/20-COVID-19 PCR positive. 1/20-blood cultures NGTD. 1/20-urine culture NGTD.  Assessment & Plan: Acute metabolic encephalopathy in the setting of AKI, hypernatremia and COVID-19 infection: Improving.  Able to walk with walker and converse with sister at baseline.  EEG, CT head and MRI brain negative for acute finding.  Neurology recommended treating treatable causes. -Reorientation and delirium precautions. -Treat treatable causes.  Oropharyngeal dysphagia-removed NGT x2, and replaced for the third time -SLP following -Continue TF -May need PEG tube if no improvement.  Nonoliguric AKI on CKD-4: Likely prerenal from poor p.o. intake and dehydration.  Slowly improving. Recent Labs    01/18/21 0655 01/19/21 0820 01/20/21 0338 01/21/21 0422 01/22/21 0425 01/23/21 0425 01/24/21 0420 01/25/21 0429 01/27/21 0845 01/29/21 0422  BUN 59* 61* 59* 61* 59* 59* 60* 64* 63* 58*  CREATININE 2.98* 2.98* 2.81* 2.94* 2.67* 2.80* 2.63* 2.59* 2.46* 2.35*  -Renal ultrasound without acute finding. -Not a candidate for HD per nephrology -Continue monitoring -Avoid nephrotoxic meds  Acute on chronic anemia of chronic disease: H&H stable after 2 units.  FOBT negative.. Recent Labs    01/20/21 5053 01/20/21 1103 01/20/21 2245 01/21/21 0422 01/22/21 0425 01/23/21 0425 01/24/21 0420 01/25/21 0429 01/27/21 0845 01/29/21 0422  HGB 7.4* 6.7* 8.1*  8.1* 8.0* 8.0* 8.2* 7.8* 8.2* 8.1*  -Continue monitoring  Anasarca secondary to hypoalbuminemia Acute right upper extremity swelling: RUE negative for DVT.   -Seems to have improved with arm elevation and low-dose IV Lasix-continue. -Continue arm elevation -Optimize nutrition  COVID-19 viral infection: Tested positive on 01/02/2021.  Received remdesivir x3 doses. -No indication for airborne  isolation  Hypernatremia/Hyperkalemia: Resolved.  Essential HTN -Continue Coreg and hydralazine -As needed hydralazine with parameters.  Chronic diastolic CHF: Stable -Continue low-dose IV Lasix -Monitor fluid and respiratory status closely.  History of CVA in 09/2020 with residual left hemiparesis and expressive aphasia: CT head and MRI brain without acute finding. -Continue Plavix, Lipitor via tube feeding -PT has recommended SNF  Acute urinary retention? Has had foley catheter for about 3 weeks now. -Start low-dose bethanechol -Voiding trial in the morning  Goals of care: Multiple comorbidities as above.  Still full code which will pose more harm than benefit.  DNR/DNI recommended by multiple experts including palliative medicine in the past but family insisted on full code and full scope of care.   Body mass index is 28.24 kg/m. Nutrition Problem: Increased nutrient needs Etiology: acute illness (COVID-19 infection) Signs/Symptoms: estimated needs Interventions: Refer to RD note for recommendations Pressure Injury Sacrum Mid red open skin (Active)     Location: Sacrum  Location Orientation: Mid  Staging:   Wound Description (Comments): red open skin  Present on Admission: Yes   DVT prophylaxis:  heparin injection 5,000 Units Start: 01/15/21 2200 Place and maintain sequential compression device Start: 01/02/21 2308  Code Status: Full code Family Communication: Patient and/or RN. Available if any question.  Level of care: Progressive Status is: Inpatient  Remains inpatient appropriate because:Altered mental status, Unsafe d/c plan, IV treatments appropriate due to intensity of illness or inability to take PO and Inpatient level of care appropriate due to severity of illness   Dispo:  Patient From: [facility]  Planned Disposition: [place]  Expected discharge date: 01/31/2021  Medically stable for discharge: No          Consultants:  Nephrology Neurology Palliative medicine   Sch Meds:  Scheduled Meds: . atorvastatin  80 mg Per Tube Daily  . carvedilol  6.25 mg Per NG tube BID WC  . chlorhexidine  15 mL Mouth Rinse BID  . Chlorhexidine Gluconate Cloth  6 each Topical Q0600  . dorzolamide-timolol  1 drop Both Eyes BID  . ferrous sulfate  300 mg Per Tube QODAY  . free water  300 mL Per Tube Q4H  . furosemide  20 mg Intravenous Daily  . heparin injection (subcutaneous)  5,000 Units Subcutaneous Q8H  . hydrALAZINE  50 mg Per Tube Q8H  . insulin aspart  0-9 Units Subcutaneous Q4H  . insulin glargine  5 Units Subcutaneous BID  . mouth rinse  15 mL Mouth Rinse q12n4p  . nystatin  5 mL Mouth/Throat QID  . pantoprazole sodium  40 mg Per Tube Daily   Continuous Infusions: . feeding supplement (NEPRO CARB STEADY) 50 mL/hr at 01/28/21 1500   PRN Meds:.hydrALAZINE, metoprolol tartrate  Antimicrobials: Anti-infectives (From admission, onward)   Start     Dose/Rate Route Frequency Ordered Stop   01/07/21 0800  ceFEPIme (MAXIPIME) 2 g in sodium chloride 0.9 % 100 mL IVPB  Status:  Discontinued        2 g 200 mL/hr over 30 Minutes Intravenous Daily 01/06/21 1159 01/08/21 1257   01/05/21 1500  vancomycin (VANCOCIN) IVPB 1000  mg/200 mL premix  Status:  Discontinued        1,000 mg 200 mL/hr over 60 Minutes Intravenous Every 24 hours 01/05/21 1357 01/05/21 1414   01/05/21 1500  vancomycin (VANCOCIN) IVPB 1000 mg/200 mL premix  Status:  Discontinued        1,000 mg 200 mL/hr over 60 Minutes Intravenous Every 48 hours 01/05/21 1414 01/06/21 1146   01/05/21 1445  ceFEPIme (MAXIPIME) 2 g in sodium chloride 0.9 % 100 mL IVPB  Status:  Discontinued        2 g 200 mL/hr over 30 Minutes Intravenous Daily 01/05/21 1351 01/06/21 1146   01/03/21 1000  remdesivir 100 mg in sodium chloride 0.9 % 100 mL IVPB  Status:  Discontinued       "Followed by" Linked Group Details   100 mg 200 mL/hr over 30 Minutes  Intravenous Daily 01/02/21 2257 01/04/21 1330   01/02/21 2300  remdesivir 200 mg in sodium chloride 0.9% 250 mL IVPB       "Followed by" Linked Group Details   200 mg 580 mL/hr over 30 Minutes Intravenous Once 01/02/21 2257 01/03/21 0147       I have personally reviewed the following labs and images: CBC: Recent Labs  Lab 01/23/21 0425 01/24/21 0420 01/25/21 0429 01/27/21 0845 01/29/21 0422  WBC 3.7* 3.6* 3.7* 3.3* 2.6*  NEUTROABS 2.3 2.6 2.6 2.3 1.6*  HGB 8.0* 8.2* 7.8* 8.2* 8.1*  HCT 27.4* 28.3* 26.6* 27.1* 25.5*  MCV 99.3 99.6 99.3 95.1 90.1  PLT 151 129* 127* 134* 140*   BMP &GFR Recent Labs  Lab 01/23/21 0425 01/24/21 0420 01/25/21 0429 01/27/21 0845 01/29/21 0422  NA 151* 149* 146* 144 142  K 5.1 5.0 5.3* 4.6 4.3  CL 120* 120* 118* 111 110  CO2 22 22 22 22 24   GLUCOSE 111* 135* 136* 149* 92  BUN 59* 60* 64* 63* 58*  CREATININE 2.80* 2.63* 2.59* 2.46* 2.35*  CALCIUM 8.5* 8.9 8.7* 8.6* 8.7*   Estimated Creatinine Clearance: 22.4 mL/min (A) (by C-G formula based on SCr of 2.35 mg/dL (H)). Liver & Pancreas: Recent Labs  Lab 01/23/21 0425 01/24/21 0420 01/25/21 0429 01/27/21 0845 01/29/21 0422  AST 34 47* 30 32 30  ALT 70* 77* 60* 58* 46*  ALKPHOS 106 121 121 143* 142*  BILITOT 0.4 0.5 0.5 0.6 0.5  PROT 5.8* 6.2* 6.1* 6.4* 6.4*  ALBUMIN 2.1* 2.2* 2.3* 2.3* 2.2*   No results for input(s): LIPASE, AMYLASE in the last 168 hours. No results for input(s): AMMONIA in the last 168 hours. Diabetic: No results for input(s): HGBA1C in the last 72 hours. Recent Labs  Lab 01/28/21 1953 01/29/21 0041 01/29/21 0445 01/29/21 0745 01/29/21 1156  GLUCAP 152* 134* 90 88 93   Cardiac Enzymes: No results for input(s): CKTOTAL, CKMB, CKMBINDEX, TROPONINI in the last 168 hours. No results for input(s): PROBNP in the last 8760 hours. Coagulation Profile: No results for input(s): INR, PROTIME in the last 168 hours. Thyroid Function Tests: No results for input(s):  TSH, T4TOTAL, FREET4, T3FREE, THYROIDAB in the last 72 hours. Lipid Profile: No results for input(s): CHOL, HDL, LDLCALC, TRIG, CHOLHDL, LDLDIRECT in the last 72 hours. Anemia Panel: No results for input(s): VITAMINB12, FOLATE, FERRITIN, TIBC, IRON, RETICCTPCT in the last 72 hours. Urine analysis:    Component Value Date/Time   COLORURINE YELLOW 01/02/2021 2053   APPEARANCEUR CLEAR 01/02/2021 2053   LABSPEC 1.014 01/02/2021 2053   PHURINE 5.0 01/02/2021 2053  GLUCOSEU 50 (A) 01/02/2021 2053   HGBUR NEGATIVE 01/02/2021 2053   BILIRUBINUR NEGATIVE 01/02/2021 2053   KETONESUR NEGATIVE 01/02/2021 2053   PROTEINUR >=300 (A) 01/02/2021 2053   NITRITE NEGATIVE 01/02/2021 2053   LEUKOCYTESUR NEGATIVE 01/02/2021 2053   Sepsis Labs: Invalid input(s): PROCALCITONIN, [place]  Microbiology: No results found for this or any previous visit (from the past 240 hour(s)).  Radiology Studies: DG Swallowing Func-Speech Pathology  Result Date: 01/29/2021 Objective Swallowing Evaluation: Type of Study: MBS-Modified Barium Swallow Study  Patient Details Name: Desiree Pollard MRN: [id number] Date of Birth: 04/27/1946 Today's Date: 01/29/2021 Time: SLP Start Time (ACUTE ONLY): 5456 -SLP Stop Time (ACUTE ONLY): 0905 SLP Time Calculation (min) (ACUTE ONLY): 30 min Past Medical History: Past Medical History: Diagnosis Date . Breast cancer ([place])   2017 . CVA (cerebral vascular accident) ([place]) 11/2018 . Dehydration 07/2020 . Diabetes mellitus without complication ([place])  . Diabetic retinopathy ([place])   PDR OU . Frequent diarrhea 07/2020 . Hyperkalemia 07/2020 . Hypertensive retinopathy   OU . Stroke [facility])  Past Surgical History: Past Surgical History: Procedure Laterality Date . CATARACT EXTRACTION Right  . MASTECTOMY Bilateral   "2017" Left Mastectomy; "2019" Right Mastectomy . PACEMAKER IMPLANT  2017 HPI: Pt is a 75 yo female adm to [facility] with acute metabolic encephalopathy - COVID +, has h/o CVAs x2 - last October  2021, DM, CKD4, dementia, breast cancer.  Imaging of brain previously has shown Stable appearance of right frontal encephalomalacia and bilateral  lacunar type infarcts in the basal ganglia and cerebellar  hemispheres. . Expected evolution of encephalomalacia along the right  inferior cerebellum corresponding with a presumed subacute infarct  on comparison CT.  3. Chronic microvascular angiopathy and parenchymal volume loss.  CVAs have left pt with aphasia and left weakness per Admit note.  Most recent CXR concerning for potential infectious process.  Swallow eval ordered and pt has not been able to consume po intake due to dysphagia and mentation. She has pulled out 2 Dobbhoff tubes in the past 24 to 48 hours (2/11-2/13). MD requesting SLE to determine if patient is cognitively more ready for PO's, due to patient with reported improved alertness, speech clarity.  Subjective: alert but fatigued Assessment / Plan / Recommendation CHL IP CLINICAL IMPRESSIONS 01/29/2021 Clinical Impression Pt presents with moderately severe oral and mild pharyngeal dysphagia.  Oral transiting difficulties c/b excessive oral holding, lingual rocking, delayed transiting, decreased bolus cohesion and oral retention.  Pt frequently spills boluses (and residuals post swallow) into pharynx after oral holding for approx 30 seconds to 2 minutes.   Self feeding  holding cup - *using straw* and verbal cues to swallow improved transiting but delay was still significant.  Her pharyngeal swallow is not severely impaired = with trace aspiration of secretions noted x1 and mild pharyngeal retention.  Feeding tube in place from nares to stomach and this along with deconditioning may have impaired epiglottic deflection allowing retention which pt does not sense.   Pt did cough x1 during testing and she appeared with secretion aspiration *trace* but not barium.  Secretions also retained in pharynx even at completion of testing. Will follow up to determine  when/if pt may be appropriate to initiate full meals.  Hopeful for continued improvement, however with current level of swallow function, she will be unable to support herself by po alone. SLP Visit Diagnosis Dysphagia, oropharyngeal phase (R13.12) Attention and concentration deficit following -- Frontal lobe and executive function deficit following -- Impact on safety  and function Risk for inadequate nutrition/hydration;Mild aspiration risk;Moderate aspiration risk   CHL IP TREATMENT RECOMMENDATION 01/29/2021 Treatment Recommendations Therapy as outlined in treatment plan below   Prognosis 01/29/2021 Prognosis for Safe Diet Advancement Fair Barriers to Reach Goals Time post onset Barriers/Prognosis Comment -- CHL IP DIET RECOMMENDATION 01/29/2021 SLP Diet Recommendations Other (Comment) Liquid Administration via Straw;Spoon Medication Administration Via alternative means Compensations Slow rate;Small sips/bites;Other (Comment) Postural Changes --   CHL IP OTHER RECOMMENDATIONS 01/29/2021 Recommended Consults -- Oral Care Recommendations -- Other Recommendations Have oral suction available   CHL IP FOLLOW UP RECOMMENDATIONS 01/29/2021 Follow up Recommendations Skilled Nursing facility   [facility] IP FREQUENCY AND DURATION 01/29/2021 Speech Therapy Frequency (ACUTE ONLY) min 2x/week Treatment Duration 1 week      CHL IP ORAL PHASE 01/29/2021 Oral Phase Impaired Oral - Pudding Teaspoon -- Oral - Pudding Cup -- Oral - Honey Teaspoon -- Oral - Honey Cup -- Oral - Nectar Teaspoon Holding of bolus;Decreased bolus cohesion;Premature spillage;Lingual/palatal residue Oral - Nectar Cup -- Oral - Nectar Straw Holding of bolus;Premature spillage;Decreased bolus cohesion;Lingual/palatal residue Oral - Thin Teaspoon Lingual/palatal residue;Holding of bolus;Decreased bolus cohesion;Premature spillage Oral - Thin Cup -- Oral - Thin Straw Lingual/palatal residue;Decreased bolus cohesion;Premature spillage;Holding of bolus Oral - Puree Holding of  bolus;Delayed oral transit;Premature spillage;Lingual/palatal residue Oral - Mech Soft -- Oral - Regular -- Oral - Multi-Consistency -- Oral - Pill -- Oral Phase - Comment Use of counting 1,2,3 to aid in swallow, verbal cues to swallow and self feeding were attempted with inconsistent success, A-P lingual rocking noted with pt containing bolus with oral tongue base prolonged  CHL IP PHARYNGEAL PHASE 01/29/2021 Pharyngeal Phase Impaired Pharyngeal- Pudding Teaspoon -- Pharyngeal -- Pharyngeal- Pudding Cup -- Pharyngeal -- Pharyngeal- Honey Teaspoon -- Pharyngeal -- Pharyngeal- Honey Cup -- Pharyngeal -- Pharyngeal- Nectar Teaspoon Pharyngeal residue - valleculae;Pharyngeal residue - pyriform Pharyngeal Material does not enter airway Pharyngeal- Nectar Cup -- Pharyngeal -- Pharyngeal- Nectar Straw Pharyngeal residue - valleculae Pharyngeal Material does not enter airway Pharyngeal- Thin Teaspoon -- Pharyngeal -- Pharyngeal- Thin Cup -- Pharyngeal -- Pharyngeal- Thin Straw Pharyngeal residue - valleculae;Pharyngeal residue - pyriform Pharyngeal Material does not enter airway Pharyngeal- Puree Reduced epiglottic inversion;Reduced tongue base retraction;Pharyngeal residue - valleculae Pharyngeal -- Pharyngeal- Mechanical Soft -- Pharyngeal -- Pharyngeal- Regular -- Pharyngeal -- Pharyngeal- Multi-consistency -- Pharyngeal -- Pharyngeal- Pill -- Pharyngeal -- Pharyngeal Comment Barium adhered to feeding tube at times  CHL IP CERVICAL ESOPHAGEAL PHASE 01/29/2021 Cervical Esophageal Phase WFL Pudding Teaspoon -- Pudding Cup -- Honey Teaspoon -- Honey Cup -- Nectar Teaspoon -- Nectar Cup -- Nectar Straw -- Thin Teaspoon -- Thin Cup -- Thin Straw -- Puree -- Mechanical Soft -- Regular -- Multi-consistency -- Pill -- Cervical Esophageal Comment -- Kathleen Lime, MS [facility] SLP Acute Rehab Services Office [phone] Pager [phone] [name] 01/29/2021, 12:19 PM                Lyle Niblett [place]  If  7PM-7AM, please contact night-coverage [email or web address] 01/29/2021, 3:19 PM

## 2021-01-29 NOTE — TOC Progression Note (Signed)
Transition of Care [facility]) - Progression Note    Patient Details  Name: Desiree Pollard MRN: [id number] Date of Birth: 1946-08-11  [facility] CM/SW Contact  Ross Ludwig, [place] Phone Number: 01/29/2021, 10:46 AM  Clinical Narrative:     CSW spoke to patient's sister Desiree Pollard, [phone].  She stated they would like to try to get patient placed a different facility once she is ready for discharge.  Per patient's sister, they would like either [facility], [facility], or [facility] in [place] if possible or a facility in [place].  CSW explained it will depend on which facility has beds available, which take her insurance, and how many days insurance has paid for.  CSW explained that she only has 100 days that insurance pays for, and she will have to be out of a facility or nursing home for 60 days for the days to start back over.  CSW also informed her that she would have to get preapproved again. Per patient's sister she thought patient has been at [facility] since November, but was not sure how many days she was there.  CSW asked if patient has been paying for private pay, and sister was not sure.  CSW then asked if she has applied for Medicaid, per sister they have, but have not heard back yet if she has been approved.  CSW was given permission to begin bed search in [facility] and to contact [facility].   [place] faxed information to other facilities.       Expected Discharge Plan and Services  SNF                                        Social Determinants of Health (SDOH) Interventions    Readmission Risk Interventions Readmission Risk Prevention Plan 01/13/2021  Transportation Screening Complete  PCP or Specialist Appt within 3-5 Days Complete  Social Work Consult for [place] Planning/Counseling Complete  Palliative Care Screening Complete  Medication Review [employer]) Referral to  Pharmacy

## 2021-01-29 NOTE — Progress Notes (Signed)
MBS completed, full report to follow.  Pt presents with moderately severe oral and mild pharyngeal dysphagia.  Oral transiting difficulties c/b excessive oral holding, delayed transiting, decreased bolus cohesion and oral retention.  Pt frequently spills boluses (and residuals post swallow) into pharynx after oral holding for approx 30 seconds to 2 minutes benefiting from self feeding *straw* and verbal cues to swallow.  Her pharyngeal swallow is not severely impaired = with trace aspiration of secretions noted x1 and mild pharyngeal retention.    Will follow up to determine when/if pt may be appropriate to initiate full meals.  Hopeful for continued improvement, however with current level of swallow function, she will be unable to support herself by po alone.        Recommend initiate floor stock items of *liquid nutritional supplement, applesauce, icecream, juices, soda, water,       etc* only provided by RN and SLP.  Liquid supplements will be most efficient for pt's swallowing.    Assure pt swallows before giving more, observe larynx elevate.    Check for oral pocketing after each swallow.   Oral suction before and after po and if pt is not eliciting swallow due to degree of oral deficits.    Kathleen Lime, MS [facility] SLP Acute Rehab Services Office [phone] Pager [phone]

## 2021-01-29 NOTE — Progress Notes (Signed)
Modified Barium Swallow Progress Note  Patient Details  Name: Desiree Pollard MRN: [id number] Date of Birth: 1946/11/06  Today's Date: 01/29/2021  Modified Barium Swallow completed.  Full report located under Chart Review in the Imaging Section.  Brief recommendations include the following:  Clinical Impression  Pt presents with moderately severe oral and mild pharyngeal dysphagia.  Oral transiting difficulties c/b excessive oral holding, lingual rocking, delayed transiting, decreased bolus cohesion and oral retention.  Pt frequently spills boluses (and residuals post swallow) into pharynx after oral holding for approx 30 seconds to 2 minutes.   Self feeding  holding cup - *using straw* and verbal cues to swallow improved transiting but delay was still significant.  Her pharyngeal swallow is not severely impaired = with trace aspiration of secretions noted x1 and mild pharyngeal retention.  Feeding tube in place from nares to stomach and this along with deconditioning may have impaired epiglottic deflection allowing retention which pt does not sense.   Will follow up to determine when/if pt may be appropriate to initiate full meals.  Hopeful for continued improvement, however with current level of swallow function, she will be unable to support herself by po alone.   Swallow Evaluation Recommendations       SLP Diet Recommendations: Other (Comment) (floor stock items with RN, SLP Only)   Liquid Administration via: Straw;Spoon   Medication Administration: Via alternative means   Supervision: Staff to assist with self feeding   Compensations: Slow rate;Small sips/bites;Other (Comment) (oral suction before and after po, oral suction if holding)           Other Recommendations: Have oral suction available  Kathleen Lime, MS [facility] SLP Acute Rehab Services Office [phone] Pager [phone]    [name] 01/29/2021,12:19 PM

## 2021-01-29 NOTE — Progress Notes (Signed)
RN fed pt an entire cup of applesauce and assisted her with drinking water. No pocketing noted. She needed minimal reminder to swallow, however did need to be reminded a few times. No coughing after swallowing noted. She tolerated well. RN told pt we will attempt ice cream in a little bit. Pt agreed. RN will continue to monitor.

## 2021-01-29 NOTE — Plan of Care (Signed)
  Problem: Education: °Goal: Knowledge of General Education information will improve °Description: Including pain rating scale, medication(s)/side effects and non-pharmacologic comfort measures °Outcome: Progressing °  °Problem: Health Behavior/Discharge Planning: °Goal: Ability to manage health-related needs will improve °Outcome: Progressing °  °Problem: Clinical Measurements: °Goal: Ability to maintain clinical measurements within normal limits will improve °Outcome: Progressing °Goal: Will remain free from infection °Outcome: Progressing °Goal: Diagnostic test results will improve °Outcome: Progressing °Goal: Respiratory complications will improve °Outcome: Progressing °Goal: Cardiovascular complication will be avoided °Outcome: Progressing °  °Problem: Activity: °Goal: Risk for activity intolerance will decrease °Outcome: Progressing °  °Problem: Nutrition: °Goal: Adequate nutrition will be maintained °Outcome: Progressing °  °Problem: Coping: °Goal: Level of anxiety will decrease °Outcome: Progressing °  °Problem: Elimination: °Goal: Will not experience complications related to bowel motility °Outcome: Progressing °Goal: Will not experience complications related to urinary retention °Outcome: Progressing °  °Problem: Pain Managment: °Goal: General experience of comfort will improve °Outcome: Progressing °  °Problem: Safety: °Goal: Ability to remain free from injury will improve °Outcome: Progressing °  °Problem: Skin Integrity: °Goal: Risk for impaired skin integrity will decrease °Outcome: Progressing °  °Problem: Education: °Goal: Knowledge of risk factors and measures for prevention of condition will improve °Outcome: Progressing °  °Problem: Coping: °Goal: Psychosocial and spiritual needs will be supported °Outcome: Progressing °  °Problem: Respiratory: °Goal: Will maintain a patent airway °Outcome: Progressing °Goal: Complications related to the disease process, condition or treatment will be avoided or  minimized °Outcome: Progressing °  °Problem: Education: °Goal: Knowledge of risk factors and measures for prevention of condition will improve °Outcome: Progressing °  °Problem: Coping: °Goal: Psychosocial and spiritual needs will be supported °Outcome: Progressing °  °Problem: Respiratory: °Goal: Will maintain a patent airway °Outcome: Progressing °Goal: Complications related to the disease process, condition or treatment will be avoided or minimized °Outcome: Progressing °  °

## 2021-01-29 NOTE — Plan of Care (Signed)

## 2021-01-29 NOTE — Progress Notes (Signed)
Received paper instructions from speech therapist that pt is allowed to have applesauce, ice cream, juice, water with RN or ST observation only. RN informed pt. She does not want anything right now. RN told her that I would be back in about 30 minutes to have her try some ice cream or apple sauce. Pt responds "thank you." RN will continue to monitor.

## 2021-01-29 NOTE — Progress Notes (Signed)
Modified Barium Swallow Progress Note  Patient Details  Name: Desiree Pollard MRN: [id number] Date of Birth: [birth year]  Today's Date: 01/29/2021  Modified Barium Swallow completed.  Full report located under Chart Review in the Imaging Section.  Brief recommendations include the following:  Clinical Impression  Pt presents with moderately severe oral and mild pharyngeal dysphagia.  Oral transiting difficulties c/b excessive oral holding, lingual rocking, delayed transiting, decreased bolus cohesion and oral retention.  Pt frequently spills boluses (and residuals post swallow) into pharynx after oral holding for approx 30 seconds to 2 minutes.   Self feeding  holding cup - *using straw* and verbal cues to swallow improved transiting but delay was still significant.  Her pharyngeal swallow is not severely impaired = with trace aspiration of secretions noted x1 and mild pharyngeal retention.  Feeding tube in place from nares to stomach and this along with deconditioning may have impaired epiglottic deflection allowing retention which pt does not sense.    Pt did cough x1 during testing and she appeared with secretion aspiration *trace* but not barium.  Secretions also retained in pharynx even at completion of testing.   Will follow up to determine when/if pt may be appropriate to initiate full meals.  Hopeful for continued improvement, however with current level of swallow function, she will be unable to support herself by po alone.   Swallow Evaluation Recommendations       SLP Diet Recommendations: Other (Comment) (floor stock items with RN, SLP Only)   Liquid Administration via: Straw;Spoon   Medication Administration: Via alternative means   Supervision: Staff to assist with self feeding   Compensations: Slow rate;Small sips/bites;Other (Comment) (oral suction before and after po, oral suction if holding)           Other Recommendations: Have oral suction available  Kathleen Lime, MS  [facility] SLP Acute Rehab Services Office [phone] Pager [phone]    [name] 01/29/2021,12:21 PM

## 2021-01-29 NOTE — NC FL2 (Signed)
[place] LEVEL OF CARE SCREENING TOOL     IDENTIFICATION  Patient Name: Desiree Pollard Birthdate: 04-23-1946 Sex: female Admission Date (Current Location): 01/02/2021  Hospital [address] and [place] Number:  Herbalist and Address:  [facility],  [address] [place], [place]      Provider Number: [id number]  Attending Physician Name and Address:  Mercy Riding, MD  Relative Name and Phone Number:  cheek,christine Sister   [phone]  Bogen,theodore Spouse   [phone]  wash,katherine Sister   [phone]    Current Level of Care: Hospital Recommended Level of Care: [place] Prior Approval Number:    Date Approved/Denied:   PASRR Number: [id number]  Discharge Plan: SNF    Current Diagnoses: Patient Active Problem List   Diagnosis Date Noted  . COVID-19 virus infection 01/02/2021  . Hypernatremia 01/02/2021  . 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 CKD (chronic kidney disease), stage IV ([place]) 11/26/2020  . Acute metabolic encephalopathy 39/02/91  . Generalized weakness 11/24/2020  . Acute lower UTI 11/24/2020  . Hypothermia 11/24/2020  . ARF (acute renal failure) ([place]) 10/19/2020  . Dizziness 10/19/2020  . Thrombocytopenia ([place]) 10/19/2020  . Chronic diarrhea 09/18/2020  . Loss of weight 09/18/2020  . Dysphagia 09/18/2020  . Insulin dependent type 2 diabetes mellitus ([place]) 06/16/2020  . Hemoglobin A1c less than 7.0% 06/16/2020  . Hyperglycemia 06/16/2020  . Hypertension 06/16/2020  . History of stroke 06/16/2020    Orientation RESPIRATION BLADDER Height & Weight     Self,Place  Normal Incontinent Weight: 180 lb 5.4 oz (81.8 kg) (bedscale) Height:  5\' 7"  (170.2 cm)  BEHAVIORAL SYMPTOMS/MOOD NEUROLOGICAL BOWEL NUTRITION STATUS      Continent Diet (Ice Chips they are trying to advance diet, may end up need a peg tube.)  AMBULATORY STATUS COMMUNICATION OF NEEDS Skin   Limited Assist Verbally PU  Stage and Appropriate Care (PRN dressing changes)                       Personal Care Assistance Level of Assistance  Bathing,Feeding,Dressing Bathing Assistance: Limited assistance Feeding assistance: Limited assistance Dressing Assistance: Limited assistance     Functional Limitations Info  Sight,Hearing,Speech Sight Info: Adequate Hearing Info: Adequate Speech Info: Adequate    SPECIAL CARE FACTORS FREQUENCY  PT (By licensed PT),OT (By licensed OT)     PT Frequency: Minimum 5x a week OT Frequency: Minimum 5x a week            Contractures Contractures Info: Not present    Additional Factors Info  Code Status,Allergies,Insulin Sliding Scale Code Status Info: Full Code Allergies Info: Amlodipine   Insulin Sliding Scale Info: insulin glargine (LANTUS) injection 5 Units 2 times daily       Current Medications (01/29/2021):  This is the current hospital active medication list Current Facility-Administered Medications  Medication Dose Route Frequency Provider Last Rate Last Admin  . atorvastatin (LIPITOR) tablet 80 mg  80 mg Per Tube Daily Dessa Phi, DO   80 mg at 01/28/21 3300  . carvedilol (COREG) tablet 6.25 mg  6.25 mg Per NG tube BID WC Hall, Carole N, DO   6.25 mg at 01/28/21 1723  . chlorhexidine (PERIDEX) 0.12 % solution 15 mL  15 mL Mouth Rinse BID Mariel Aloe, MD   15 mL at 01/28/21 2136  . Chlorhexidine Gluconate Cloth 2 % PADS 6 each  6 each Topical Q0600 Mariel Aloe,  MD   6 each at 01/28/21 1034  . dorzolamide-timolol (COSOPT) 22.3-6.8 MG/ML ophthalmic solution 1 drop  1 drop Both Eyes BID Etta Quill, DO   1 drop at 01/28/21 2138  . feeding supplement (NEPRO CARB STEADY) liquid 1,000 mL  1,000 mL Per Tube Continuous Nita Sells, MD 50 mL/hr at 01/28/21 1500 Infusion Verify at 01/28/21 1500  . ferrous sulfate 300 (60 Fe) MG/5ML syrup 300 mg  300 mg Per Tube Orest Dikes, DO   300 mg at 01/28/21 2248  . free water 300 mL   300 mL Per Tube Q4H Samtani, Jai-Gurmukh, MD   300 mL at 01/29/21 0400  . furosemide (LASIX) injection 20 mg  20 mg Intravenous Daily Nita Sells, MD   20 mg at 01/28/21 0918  . heparin injection 5,000 Units  5,000 Units Subcutaneous Q8H Irene Pap N, DO   5,000 Units at 01/29/21 0507  . hydrALAZINE (APRESOLINE) injection 10-20 mg  10-20 mg Intravenous Q4H PRN Etta Quill, DO   20 mg at 01/27/21 1710  . hydrALAZINE (APRESOLINE) tablet 50 mg  50 mg Per Tube Q8H Hall, Carole N, DO   50 mg at 01/29/21 0507  . insulin aspart (novoLOG) injection 0-9 Units  0-9 Units Subcutaneous Q4H Mariel Aloe, MD   1 Units at 01/29/21 0100  . insulin glargine (LANTUS) injection 5 Units  5 Units Subcutaneous BID Kayleen Memos, DO   5 Units at 01/28/21 2137  . MEDLINE mouth rinse  15 mL Mouth Rinse q12n4p Mariel Aloe, MD   15 mL at 01/28/21 1727  . metoprolol tartrate (LOPRESSOR) injection 2.5 mg  2.5 mg Intravenous Q6H PRN Irene Pap N, DO      . nystatin (MYCOSTATIN) 100000 UNIT/ML suspension 500,000 Units  5 mL Mouth/Throat QID Dessa Phi, DO   500,000 Units at 01/28/21 2137  . pantoprazole sodium (PROTONIX) 40 mg/20 mL oral suspension 40 mg  40 mg Per Tube Daily Dessa Phi, DO   40 mg at 01/28/21 2500     Discharge Medications: Please see discharge summary for a list of discharge medications.  Relevant Imaging Results:  Relevant Lab Results:   Additional Information SSN [id number]  Ross Ludwig, LCSW

## 2021-01-30 DIAGNOSIS — Z8673 Personal history of transient ischemic attack (TIA), and cerebral infarction without residual deficits: Secondary | ICD-10-CM | POA: Diagnosis not present

## 2021-01-30 DIAGNOSIS — U071 COVID-19: Secondary | ICD-10-CM | POA: Diagnosis not present

## 2021-01-30 DIAGNOSIS — G9341 Metabolic encephalopathy: Secondary | ICD-10-CM | POA: Diagnosis not present

## 2021-01-30 DIAGNOSIS — R1312 Dysphagia, oropharyngeal phase: Secondary | ICD-10-CM | POA: Diagnosis not present

## 2021-01-30 LAB — CBC WITH DIFFERENTIAL/PLATELET
Abs Immature Granulocytes: 0.06 10*3/uL (ref 0.00–0.07)
Basophils Absolute: 0 10*3/uL (ref 0.0–0.1)
Basophils Relative: 1 %
Eosinophils Absolute: 0.1 10*3/uL (ref 0.0–0.5)
Eosinophils Relative: 3 %
HCT: 25.9 % — ABNORMAL LOW (ref 36.0–46.0)
Hemoglobin: 7.7 g/dL — ABNORMAL LOW (ref 12.0–15.0)
Immature Granulocytes: 2 %
Lymphocytes Relative: 22 %
Lymphs Abs: 0.6 10*3/uL — ABNORMAL LOW (ref 0.7–4.0)
MCH: 28.5 pg (ref 26.0–34.0)
MCHC: 29.7 g/dL — ABNORMAL LOW (ref 30.0–36.0)
MCV: 95.9 fL (ref 80.0–100.0)
Monocytes Absolute: 0.3 10*3/uL (ref 0.1–1.0)
Monocytes Relative: 13 %
Neutro Abs: 1.6 10*3/uL — ABNORMAL LOW (ref 1.7–7.7)
Neutrophils Relative %: 59 %
Platelets: 145 10*3/uL — ABNORMAL LOW (ref 150–400)
RBC: 2.7 MIL/uL — ABNORMAL LOW (ref 3.87–5.11)
RDW: 15.2 % (ref 11.5–15.5)
WBC: 2.7 10*3/uL — ABNORMAL LOW (ref 4.0–10.5)
nRBC: 0 % (ref 0.0–0.2)

## 2021-01-30 LAB — RENAL FUNCTION PANEL
Albumin: 2.2 g/dL — ABNORMAL LOW (ref 3.5–5.0)
Anion gap: 9 (ref 5–15)
BUN: 61 mg/dL — ABNORMAL HIGH (ref 8–23)
CO2: 24 mmol/L (ref 22–32)
Calcium: 8.6 mg/dL — ABNORMAL LOW (ref 8.9–10.3)
Chloride: 107 mmol/L (ref 98–111)
Creatinine, Ser: 2.38 mg/dL — ABNORMAL HIGH (ref 0.44–1.00)
GFR, Estimated: 21 mL/min — ABNORMAL LOW (ref >60)
Glucose, Bld: 110 mg/dL — ABNORMAL HIGH (ref 70–99)
Phosphorus: 3.9 mg/dL (ref 2.5–4.6)
Potassium: 4.1 mmol/L (ref 3.5–5.1)
Sodium: 140 mmol/L (ref 135–145)

## 2021-01-30 LAB — GLUCOSE, CAPILLARY
Glucose-Capillary: 104 mg/dL — ABNORMAL HIGH (ref 70–99)
Glucose-Capillary: 106 mg/dL — ABNORMAL HIGH (ref 70–99)
Glucose-Capillary: 143 mg/dL — ABNORMAL HIGH (ref 70–99)
Glucose-Capillary: 178 mg/dL — ABNORMAL HIGH (ref 70–99)
Glucose-Capillary: 144 mg/dL — ABNORMAL HIGH (ref 70–99)

## 2021-01-30 LAB — MAGNESIUM: Magnesium: 2 mg/dL (ref 1.7–2.4)

## 2021-01-30 MED ORDER — NEPRO/CARBSTEADY PO LIQD
237.0000 mL | Freq: Three times a day (TID) | ORAL | Status: DC
  Filled 2021-01-30: qty 237

## 2021-01-30 NOTE — TOC Progression Note (Signed)
Transition of Care [facility]) - Progression Note    Patient Details  Name: Desiree Pollard MRN: [id number] Date of Birth: 1946/07/03  Transition of [facility] CM/SW Contact  Ross Ludwig, [place] Phone Number: 01/30/2021, 4:48 PM  Clinical Narrative:     CSW received phone call from Carollee Leitz at [facility], they said if patient is able to advance diet and not need NG tube they could potentially accept her.  CSW continuing to follow patient's progress throughout discharge planning.      Expected Discharge Plan and Services                                                 Social Determinants of Health (SDOH) Interventions    Readmission Risk Interventions Readmission Risk Prevention Plan 01/13/2021  Transportation Screening Complete  PCP or Specialist Appt within 3-5 Days Complete  Social Work Consult for [place] Planning/Counseling Complete  Palliative Care Screening Complete  Medication Review [employer] photographer) Referral to Pharmacy

## 2021-01-30 NOTE — Progress Notes (Signed)
Pt voided 200cc yellow urine post foley removal. SRP,RN

## 2021-01-30 NOTE — Progress Notes (Signed)
Foley D/C'd will monitor voiding...Marland KitchenMarland KitchenLasix 20 mg given as ordered. Due to void. Will check post void residual, etc. SRP, RN

## 2021-01-30 NOTE — Plan of Care (Signed)

## 2021-01-30 NOTE — Progress Notes (Signed)
Pt ate 15% of meal tray---4 bites potatoes, 4 bites pureed chicken, 2 bites of vanilla Magic cup(did not like), 2 bites of applesauce, 0 corn. Will continue to encourage intake. Pt voided 2nd time 100 to total of 300cc yellow urine. Will cont to monitor. SRP, RN

## 2021-01-30 NOTE — Progress Notes (Signed)
  Speech Language Pathology Treatment: Dysphagia  Patient Details Name: Desiree Pollard MRN: [id number] DOB: [birth year] Today's Date: 01/30/2021 Time: 2542-7062 SLP Time Calculation (min) (ACUTE ONLY): 41 min  Assessment / Plan / Recommendation Clinical Impression  PT seen to determine readiness for po diet initiation. She was oriented to location for this first time with this SLP since admission!  SlP set up oral suction and performed oral care on pt prior to po administration.  Desiree Pollard consumed approx 3/4 container of orange sherbert, and 4 ounces of cranberry juice over approx 30 minutes.    Her swallow continues to be excessively delayed however self feeding *Holding her own cup and spoon - allows improved neuro input for automaticity of swallowing.  Efficiency improved from more than 22 seconds to initiate swallow with being  fed - to 8 seconds most latent with self feeding.  She continues with dry swallows that are delayed - dry swallows to clear oropharyngeal retention based on MBS.  However, when pt did not conduct a dry swallow with every bolus, she continued to manage without indication of aspiration with small boluses.  Cough x1 noted with larger consecutive boluses of cranberry juice.      Recommend pt initiate a diet of puree/thin with very strict precautions.  Messaged RD to inquire regarding tube feeding titration to improve pt's intake.  Pt has made significant progress during hospital coarse and she is able to teach back precautions.  Messaged MD with recommendations and obtained orders.  Thank you.   Will follow up closely.    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  Imaging of brain previously has shown Stable appearance of right frontal encephalomalacia and bilateral  lacunar type infarcts in the basal ganglia and cerebellar  hemispheres. . Expected evolution of  encephalomalacia along the right  inferior cerebellum corresponding with a presumed subacute infarct  on comparison CT.  3. Chronic microvascular angiopathy and parenchymal volume loss.  CVAs have left pt with aphasia and left weakness per Admit note.  Most recent CXR concerning for potential infectious process.  Swallow eval ordered and pt has not been able to consume po intake due to dysphagia and mentation. She has pulled out 2 Dobbhoff tubes in the past 24 to 48 hours (2/11-2/13). MD requesting SLE to determine if patient is cognitively more ready for PO's, due to patient with reported improved alertness, speech clarity.      SLP Plan  Continue with current plan of care       Recommendations  Diet recommendations: Thin liquid;Dysphagia 1 (puree) Liquids provided via: Cup;Straw Medication Administration: Via alternative means Compensations: Slow rate;Small sips/bites;Other (Comment) (pt must help self feed) Postural Changes and/or Swallow Maneuvers: Seated upright 90 degrees;Upright 30-60 min after meal                Oral Care Recommendations: Oral care QID;Oral care prior to ice chip/H20 Follow up Recommendations: 24 hour supervision/assistance SLP Visit Diagnosis: Dysphagia, unspecified (R13.10) Plan: Continue with current plan of care       GO                Desiree Pollard 01/30/2021, 12:34 PM   Desiree Lime, Desiree [place] Office [phone] Pager [phone]

## 2021-01-30 NOTE — Progress Notes (Signed)
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  MRI brain negative for acute finding.  Hospital course complicated by AKI and dysphagia.  Nephrology did not feel patient is a candidate for HD and recommended DNR and possible hospice. She pulled out NGT twice but replaced for the third time.  SLP upgraded to dysphagia 1 diet.  Optimistic to avoid PEG tube if possible.  Subjective: Seen and examined earlier this morning.  No major events overnight of this morning.  No complaints but not a great historian.  She is awake but not quite alert.  She is oriented to self and "hospital".  She follows commands.  Objective: Vitals:   01/30/21 0500 01/30/21 0649 01/30/21 0650 01/30/21 1259  BP:  (!) 142/79 (!) 161/59 (!) 139/55  Pulse:  72 60 60  Resp:  20 20 18   Temp:   97.7 F (36.5 C) 97.6 F (36.4 C)  TempSrc:   Oral Axillary  SpO2:  100% 99% 100%  Weight: 83.8 kg     Height:        Intake/Output Summary (Last 24 hours) at 01/30/2021 1542 Last data filed at 01/30/2021 1511 Gross per 24 hour  Intake 3210 ml  Output 1875 ml  Net 1335 ml   Filed Weights   01/28/21 0531 01/29/21 0500 01/30/21 0500  Weight: 84.4 kg 81.8 kg 83.8 kg    Examination:  GENERAL: No apparent distress.  Nontoxic. HEENT: MMM.  Vision and hearing grossly intact.  NECK: Supple.  No apparent JVD.  RESP: 99% on RA.  No IWOB.  Fair aeration bilaterally. CVS:  RRR. Heart sounds normal.  ABD/GI/GU: BS+. Abd soft, NTND.  MSK/EXT:  Moves extremities. No apparent deformity.  RUE elevated.  SKIN: no apparent skin lesion or wound NEURO: Awake, alert and oriented  appropriately.  Expressive aphasia.  No apparent focal neuro deficit. PSYCH: Calm. Normal affect.  Procedures:  None  Microbiology summarized: 1/20-COVID-19 PCR positive. 1/20-blood cultures NGTD. 1/20-urine culture NGTD.  Assessment & Plan: Acute metabolic encephalopathy in the setting of AKI, hypernatremia and COVID-19 infection: Improving.  Able to walk with walker and converse with sister at baseline.  EEG, CT head and MRI brain negative for acute finding.  Neurology recommended treating treatable causes. -Reorientation and delirium precautions. -Treat treatable causes.  Oropharyngeal dysphagia-removed NGT x2, and replaced for the third time -Upgraded to dysphagia 1 diet. -Continue TF  Nonoliguric AKI on CKD-4: Likely prerenal from poor p.o. intake and dehydration.  Slowly improving. Recent Labs    01/19/21 0820 01/20/21 0338 01/21/21 0422 01/22/21 0425 01/23/21 0425 01/24/21 0420 01/25/21 0429 01/27/21 0845 01/29/21 0422 01/30/21 0413  BUN 61* 59* 61* 59* 59* 60* 64* 63* 58* 61*  CREATININE 2.98* 2.81* 2.94* 2.67* 2.80* 2.63* 2.59* 2.46* 2.35* 2.38*  -Renal ultrasound without acute finding. -Not a candidate for HD per nephrology -Continue monitoring -Avoid nephrotoxic meds  Acute on chronic anemia of chronic disease: H&H stable after 2 units.  FOBT negative.anemia panel consistent with ACD. Recent Labs    01/20/21 1103 01/20/21 2245 01/21/21 0422 01/22/21 0425 01/23/21 0425 01/24/21 0420 01/25/21 0429 01/27/21 0845 01/29/21 0422 01/30/21 0413  HGB 6.7* 8.1* 8.1*  8.0* 8.0* 8.2* 7.8* 8.2* 8.1* 7.7*  -Continue monitoring -Optimize nutrition.  Anasarca secondary to hypoalbuminemia Acute right upper extremity swelling: RUE negative for DVT.   -Seems to have improved with arm elevation and low-dose IV Lasix-continue. -Optimize nutrition  COVID-19 viral infection: Tested positive on 01/02/2021.  Received remdesivir x3 doses. -No indication for airborne  isolation  Hypernatremia/Hyperkalemia: Resolved.  Essential HTN: BP within fair range. -Continue Coreg and hydralazine -As needed hydralazine with parameters.  Chronic diastolic CHF: Stable -Continue low-dose IV Lasix -Monitor fluid and respiratory status closely.  History of CVA in 09/2020 with residual left hemiparesis and expressive aphasia: CT head and MRI brain without acute finding. -Continue Plavix, Lipitor via tube feeding -PT has recommended SNF  Acute urinary retention? Has had foley catheter for about 3 weeks now. -Start low-dose bethanechol -Voiding trial today.  Goals of care: Multiple comorbidities as above.  Still full code which will pose more harm than benefit.  DNR/DNI recommended by multiple experts including palliative medicine in the past but family insisted on full code and full scope of care.   Body mass index is 28.94 kg/m. Nutrition Problem: Increased nutrient needs Etiology: acute illness (COVID-19 infection) Signs/Symptoms: estimated needs Interventions: Refer to RD note for recommendations Pressure Injury Sacrum Mid red open skin (Active)     Location: Sacrum  Location Orientation: Mid  Staging:   Wound Description (Comments): red open skin  Present on Admission: Yes   DVT prophylaxis:  heparin injection 5,000 Units Start: 01/15/21 2200 Place and maintain sequential compression device Start: 01/02/21 2308  Code Status: Full code Family Communication: Updated patient's sister over the phone. Level of care: Progressive.  Transfer to medsurg Status is: Inpatient  Remains inpatient appropriate because:Altered mental status, Unsafe d/c plan, IV treatments appropriate due to intensity of illness or inability to take PO and Inpatient level of care appropriate due to severity of illness   Dispo:  Patient From: [place]  Planned Disposition: [place]  Expected discharge date: 01/31/2021  Medically stable for  discharge: No         Consultants:  Nephrology Neurology Palliative medicine   Sch Meds:  Scheduled Meds: . atorvastatin  80 mg Per Tube Daily  . bethanechol  10 mg Oral TID  . carvedilol  6.25 mg Per NG tube BID WC  . chlorhexidine  15 mL Mouth Rinse BID  . Chlorhexidine Gluconate Cloth  6 each Topical Q0600  . dorzolamide-timolol  1 drop Both Eyes BID  . feeding supplement (NEPRO CARB STEADY)  237 mL Oral TID WC  . ferrous sulfate  300 mg Per Tube QODAY  . free water  300 mL Per Tube Q4H  . furosemide  20 mg Intravenous Daily  . heparin injection (subcutaneous)  5,000 Units Subcutaneous Q8H  . hydrALAZINE  50 mg Per Tube Q8H  . insulin aspart  0-9 Units Subcutaneous Q4H  . insulin glargine  5 Units Subcutaneous BID  . mouth rinse  15 mL Mouth Rinse q12n4p  . nystatin  5 mL Mouth/Throat QID  . pantoprazole sodium  40 mg Per Tube Daily   Continuous Infusions: . feeding supplement (NEPRO CARB STEADY) 50 mL/hr at 01/29/21 1600   PRN Meds:.hydrALAZINE, metoprolol tartrate  Antimicrobials: Anti-infectives (From admission, onward)   Start     Dose/Rate Route Frequency Ordered Stop   01/07/21 0800  ceFEPIme (MAXIPIME) 2 g in sodium chloride 0.9 % 100 mL IVPB  Status:  Discontinued  2 g 200 mL/hr over 30 Minutes Intravenous Daily 01/06/21 1159 01/08/21 1257   01/05/21 1500  vancomycin (VANCOCIN) IVPB 1000 mg/200 mL premix  Status:  Discontinued        1,000 mg 200 mL/hr over 60 Minutes Intravenous Every 24 hours 01/05/21 1357 01/05/21 1414   01/05/21 1500  vancomycin (VANCOCIN) IVPB 1000 mg/200 mL premix  Status:  Discontinued        1,000 mg 200 mL/hr over 60 Minutes Intravenous Every 48 hours 01/05/21 1414 01/06/21 1146   01/05/21 1445  ceFEPIme (MAXIPIME) 2 g in sodium chloride 0.9 % 100 mL IVPB  Status:  Discontinued        2 g 200 mL/hr over 30 Minutes Intravenous Daily 01/05/21 1351 01/06/21 1146   01/03/21 1000  remdesivir 100 mg in sodium chloride 0.9 % 100  mL IVPB  Status:  Discontinued       "Followed by" Linked Group Details   100 mg 200 mL/hr over 30 Minutes Intravenous Daily 01/02/21 2257 01/04/21 1330   01/02/21 2300  remdesivir 200 mg in sodium chloride 0.9% 250 mL IVPB       "Followed by" Linked Group Details   200 mg 580 mL/hr over 30 Minutes Intravenous Once 01/02/21 2257 01/03/21 0147       I have personally reviewed the following labs and images: CBC: Recent Labs  Lab 01/24/21 0420 01/25/21 0429 01/27/21 0845 01/29/21 0422 01/30/21 0413  WBC 3.6* 3.7* 3.3* 2.6* 2.7*  NEUTROABS 2.6 2.6 2.3 1.6* 1.6*  HGB 8.2* 7.8* 8.2* 8.1* 7.7*  HCT 28.3* 26.6* 27.1* 25.5* 25.9*  MCV 99.6 99.3 95.1 90.1 95.9  PLT 129* 127* 134* 140* 145*   BMP &GFR Recent Labs  Lab 01/24/21 0420 01/25/21 0429 01/27/21 0845 01/29/21 0422 01/30/21 0413  NA 149* 146* 144 142 140  K 5.0 5.3* 4.6 4.3 4.1  CL 120* 118* 111 110 107  CO2 22 22 22 24 24   GLUCOSE 135* 136* 149* 92 110*  BUN 60* 64* 63* 58* 61*  CREATININE 2.63* 2.59* 2.46* 2.35* 2.38*  CALCIUM 8.9 8.7* 8.6* 8.7* 8.6*  MG  --   --   --   --  2.0  PHOS  --   --   --   --  3.9   Estimated Creatinine Clearance: 22.4 mL/min (A) (by C-G formula based on SCr of 2.38 mg/dL (H)). Liver & Pancreas: Recent Labs  Lab 01/24/21 0420 01/25/21 0429 01/27/21 0845 01/29/21 0422 01/30/21 0413  AST 47* 30 32 30  --   ALT 77* 60* 58* 46*  --   ALKPHOS 121 121 143* 142*  --   BILITOT 0.5 0.5 0.6 0.5  --   PROT 6.2* 6.1* 6.4* 6.4*  --   ALBUMIN 2.2* 2.3* 2.3* 2.2* 2.2*   No results for input(s): LIPASE, AMYLASE in the last 168 hours. No results for input(s): AMMONIA in the last 168 hours. Diabetic: No results for input(s): HGBA1C in the last 72 hours. Recent Labs  Lab 01/29/21 1928 01/29/21 2342 01/30/21 0340 01/30/21 0756 01/30/21 1119  GLUCAP 131* 119* 104* 106* 143*   Cardiac Enzymes: No results for input(s): CKTOTAL, CKMB, CKMBINDEX, TROPONINI in the last 168 hours. No  results for input(s): PROBNP in the last 8760 hours. Coagulation Profile: No results for input(s): INR, PROTIME in the last 168 hours. Thyroid Function Tests: No results for input(s): TSH, T4TOTAL, FREET4, T3FREE, THYROIDAB in the last 72 hours. Lipid Profile: No results for input(s):  CHOL, HDL, LDLCALC, TRIG, CHOLHDL, LDLDIRECT in the last 72 hours. Anemia Panel: No results for input(s): VITAMINB12, FOLATE, FERRITIN, TIBC, IRON, RETICCTPCT in the last 72 hours. Urine analysis:    Component Value Date/Time   COLORURINE YELLOW 01/02/2021 2053   APPEARANCEUR CLEAR 01/02/2021 2053   LABSPEC 1.014 01/02/2021 2053   PHURINE 5.0 01/02/2021 2053   GLUCOSEU 50 (A) 01/02/2021 2053   HGBUR NEGATIVE 01/02/2021 2053   BILIRUBINUR NEGATIVE 01/02/2021 2053   KETONESUR NEGATIVE 01/02/2021 2053   PROTEINUR >=300 (A) 01/02/2021 2053   NITRITE NEGATIVE 01/02/2021 2053   LEUKOCYTESUR NEGATIVE 01/02/2021 2053   Sepsis Labs: Invalid input(s): PROCALCITONIN, [place]  Microbiology: No results found for this or any previous visit (from the past 240 hour(s)).  Radiology Studies: No results found.   Verna Hamon T. Larimore  If 7PM-7AM, please contact night-coverage [email or web address] 01/30/2021, 3:42 PM

## 2021-01-31 DIAGNOSIS — R1312 Dysphagia, oropharyngeal phase: Secondary | ICD-10-CM | POA: Diagnosis not present

## 2021-01-31 DIAGNOSIS — Z8673 Personal history of transient ischemic attack (TIA), and cerebral infarction without residual deficits: Secondary | ICD-10-CM | POA: Diagnosis not present

## 2021-01-31 DIAGNOSIS — G9341 Metabolic encephalopathy: Secondary | ICD-10-CM | POA: Diagnosis not present

## 2021-01-31 DIAGNOSIS — D61818 Other pancytopenia: Secondary | ICD-10-CM

## 2021-01-31 DIAGNOSIS — U071 COVID-19: Secondary | ICD-10-CM | POA: Diagnosis not present

## 2021-01-31 LAB — CBC
HCT: 27.5 % — ABNORMAL LOW (ref 36.0–46.0)
Hemoglobin: 8.3 g/dL — ABNORMAL LOW (ref 12.0–15.0)
MCH: 28.9 pg (ref 26.0–34.0)
MCHC: 30.2 g/dL (ref 30.0–36.0)
MCV: 95.8 fL (ref 80.0–100.0)
Platelets: 148 10*3/uL — ABNORMAL LOW (ref 150–400)
RBC: 2.87 MIL/uL — ABNORMAL LOW (ref 3.87–5.11)
RDW: 15 % (ref 11.5–15.5)
WBC: 2.6 10*3/uL — ABNORMAL LOW (ref 4.0–10.5)
nRBC: 0 % (ref 0.0–0.2)

## 2021-01-31 LAB — GLUCOSE, CAPILLARY
Glucose-Capillary: 112 mg/dL — ABNORMAL HIGH (ref 70–99)
Glucose-Capillary: 122 mg/dL — ABNORMAL HIGH (ref 70–99)
Glucose-Capillary: 123 mg/dL — ABNORMAL HIGH (ref 70–99)
Glucose-Capillary: 143 mg/dL — ABNORMAL HIGH (ref 70–99)
Glucose-Capillary: 151 mg/dL — ABNORMAL HIGH (ref 70–99)
Glucose-Capillary: 184 mg/dL — ABNORMAL HIGH (ref 70–99)
Glucose-Capillary: 81 mg/dL (ref 70–99)

## 2021-01-31 LAB — RENAL FUNCTION PANEL
Albumin: 2.4 g/dL — ABNORMAL LOW (ref 3.5–5.0)
Anion gap: 8 (ref 5–15)
BUN: 61 mg/dL — ABNORMAL HIGH (ref 8–23)
CO2: 24 mmol/L (ref 22–32)
Calcium: 8.7 mg/dL — ABNORMAL LOW (ref 8.9–10.3)
Chloride: 107 mmol/L (ref 98–111)
Creatinine, Ser: 2.42 mg/dL — ABNORMAL HIGH (ref 0.44–1.00)
GFR, Estimated: 20 mL/min — ABNORMAL LOW (ref >60)
Glucose, Bld: 135 mg/dL — ABNORMAL HIGH (ref 70–99)
Phosphorus: 4.1 mg/dL (ref 2.5–4.6)
Potassium: 4.3 mmol/L (ref 3.5–5.1)
Sodium: 139 mmol/L (ref 135–145)

## 2021-01-31 LAB — MAGNESIUM: Magnesium: 2.2 mg/dL (ref 1.7–2.4)

## 2021-01-31 MED ORDER — FREE WATER
200.0000 mL | Freq: Four times a day (QID) | Status: DC
  Administered 2021-01-31 – 2021-02-17 (×62): 200 mL

## 2021-01-31 MED ORDER — ENSURE ENLIVE PO LIQD
237.0000 mL | ORAL | Status: DC
  Administered 2021-02-01 – 2021-02-02 (×2): 237 mL via ORAL

## 2021-01-31 MED ORDER — NEPRO/CARBSTEADY PO LIQD
1000.0000 mL | ORAL | Status: DC
  Administered 2021-01-31 – 2021-02-04 (×6): 1000 mL
  Filled 2021-01-31 (×8): qty 1000

## 2021-01-31 NOTE — Progress Notes (Signed)
Nutrition Follow-up  DOCUMENTATION CODES:   Not applicable  INTERVENTION:  - will order Magic cup TID with meals, each supplement provides 290 kcal and 9 grams of protein - will order vanilla Ensure Enlive once/day, each supplement provides 350 kcal and 20 grams of protein - will adjust TF regimen: Nepro @ 65 x18 hours/day (1800-1200) with 200 ml free water QID. - this regimen will provide 2106 kcal, 95 grams protein, and 1650 ml free water.   NUTRITION DIAGNOSIS:   Increased nutrient needs related to acute illness (COVID-19 infection) as evidenced by estimated needs. -ongoing  GOAL:   Patient will meet greater than or equal to 90% of their needs -to be met with TF regimen and PO intakes  MONITOR:   PO intake,Supplement acceptance,TF tolerance,Labs,Weight trends  REASON FOR ASSESSMENT:   Consult Enteral/tube feeding initiation and management  ASSESSMENT:   75 year old female with past medical history significant for previous stroke with residual left-sided weakness 10/21, HTN, breast cancer 2017, expressive aphasia, dementia, CKD stage IV and diabetes who presents from her nursing facility due to altered mental status and was found to have hypernatremia as well as COVID-19.  Significant Events:  1/21- admission 1/27- TF initiation 2/9- TF formula changed from Osmolite 1.5 to Nepro 2/17- diet advanced from NPO to Dysphasia 1, thin liquids at noon   Patient laying in bed with no family or visitors present at bedside. Patient able to respond to simple yes/no-type questions. She denies abdominal discomfort at this time.   Able to talk with RN who reports that patient consumed 3 bites of breakfast and then stated/indicated that she did not want any more. Flow sheet documentation from yesterday indicates that she ate 15% of dinner; no documentation of lunch.  Able to talk with SLP yesterday via secure chat. Able to communicate with MD today via secure chat about plan for TF  regimen change to encourage PO intakes.   Weight has been mainly stable since 2/10. Moderate pitting edema to BUE and mild pitting edema to BLE.   Patient remains Full Code at this time. Patient from SNF and plan for return to SNF at the time of d/c.   She has small bore NGT and is receiving Nepro @ 50 ml/hr with 300 ml free water every 4 hours. This regimen provides 2160 kcal, 97 grams protein, and 2672 ml free water.    Labs reviewed; CBGs: 151, 122, 123 mg/dl, BUN: 61 mg/dl, creatinine: 2.42 mg/dl, Ca: 8.7 mg/dl, GFR: 20 ml/min. K WDL, Mg and Phos at the high end of WDL.   Medications reviewed; 300 mg ferrous sulfate/day, 20 mg IV lasix/day, sliding scale novolog, 5 units lantus BID, 5 ml mycostatin QID, 40 mg protonix/day.    Diet Order:   Diet Order            DIET - DYS 1 Room service appropriate? Yes; Fluid consistency: Thin  Diet effective now                 EDUCATION NEEDS:   Not appropriate for education at this time  Skin:  Skin Assessment: Reviewed RN Assessment  Last BM:  2/17 (type 6 x1)  Height:   Ht Readings from Last 1 Encounters:  01/03/21 $RemoveB'5\' 7"'AgNwIema$  (1.702 m)    Weight:   Wt Readings from Last 1 Encounters:  01/31/21 83.8 kg    Estimated Nutritional Needs:  Kcal:  1800-2100kcal/day Protein:  90-105g/day Fluid:  1.6-1.8L/day     Jarome Matin, MS, RD,  LDN, CNSC Inpatient Clinical Dietitian RD pager # available in [place]  After hours/weekend pager # available in [facility]

## 2021-01-31 NOTE — Plan of Care (Signed)
  Problem: Clinical Measurements: Goal: Respiratory complications will improve Outcome: Adequate for Discharge   

## 2021-01-31 NOTE — Progress Notes (Signed)
  Speech Language Pathology Treatment: Dysphagia  Patient Details Name: Desiree Pollard MRN: [id number] DOB: [birth year] Today's Date: 01/31/2021 Time: 5170-0174 SLP Time Calculation (min) (ACUTE ONLY): 12 min  Assessment / Plan / Recommendation Clinical Impression  Pt very lethargic this afternoon.  Repositioned to optimize participation; removed mitts so that pt could self feed with physical assist.  Max verbal/visual/tactile cues needed to awaken and encourage participation.  Pt could not use a straw today; with max assist she accepted sips of liquid from edge of cup, but without lip seal or observable effort.  No attempt to swallow could be elicited and oral suctioning was required to remove limited boluses from oral cavity. Pt observed with spontaneous swallowing x1. She answered yes/no questions regarding comfort, maintained eyes closed. Discontinued efforts given lethargy.  Mitts replaced and pt reclined and covered with blankets for comfort.  SLP will continue to follow for toleration/advancement of diet when appropriate.    HPI HPI: Pt is a 75 yo female adm [facility] with acute metabolic encephalopathy - COVID +, has h/o CVAs x2 - last October 2021, DM, CKD4, dementia, breast cancer.  Imaging of brain previously has shown Stable appearance of right frontal encephalomalacia and bilateral  lacunar type infarcts in the basal ganglia and cerebellar  hemispheres. . Expected evolution of encephalomalacia along the right  inferior cerebellum corresponding with a presumed subacute infarct  on comparison CT.  3. Chronic microvascular angiopathy and parenchymal volume loss.  CVAs have left pt with aphasia and left weakness per Admit note.  Most recent CXR concerning for potential infectious process.  Swallow eval ordered and pt has not been able to consume po intake due to dysphagia and mentation. She has pulled out 2 Dobbhoff tubes in the past 24 to 48 hours (2/11-2/13). MD requesting SLE to determine if patient  is cognitively more ready for PO's, due to patient with reported improved alertness, speech clarity.      SLP Plan  Continue with current plan of care       Recommendations  Diet recommendations: Dysphagia 1 (puree);Thin liquid Liquids provided via: Cup;Straw Medication Administration: Via alternative means Supervision: Staff to assist with self feeding Compensations: Slow rate;Small sips/bites;Other (Comment) (pt must help self-feed) Postural Changes and/or Swallow Maneuvers: Seated upright 90 degrees;Upright 30-60 min after meal                Oral Care Recommendations: Oral care QID;Oral care prior to ice chip/H20 Follow up Recommendations: 24 hour supervision/assistance SLP Visit Diagnosis: Dysphagia, unspecified (R13.10) Plan: Continue with current plan of care       GO                Desiree Pollard 01/31/2021, 2:46 PM  Desiree Pollard, [place] Office number [phone] Pager [phone]

## 2021-01-31 NOTE — Progress Notes (Signed)
PROGRESS NOTE  Desiree Pollard HFW:[id number] DOB: November 27, 1946   PCP: [name], FNP  Patient is from: SNF  DOA: 01/02/2021 LOS: 36  Chief complaints: Altered mental status  Brief Narrative / Interim history: 75 year old F with PMH of CVA with left hemiparesis and expressive aphasia, dementia, CKD-4 and CHB s/p PPM admitted from SNF with hypernatremia, cough 19 infection and encephalopathy.  MRI brain negative for acute finding.  Hospital course complicated by AKI and dysphagia.  Nephrology did not feel patient is a candidate for HD and recommended DNR and possible hospice. She pulled out NGT twice but replaced for the third time.  SLP upgraded to dysphagia 1 diet.  Dietitian adjusted tube feeds to 18 hours regimen and encourage p.o. intake.  Optimistic to avoid PEG tube if possible.  Subjective: Seen and examined earlier this morning.  No major events overnight of this morning.  No complaints.  She is awake and alert.  She is oriented to self and place but not time.  She follows commands appropriately.  Responds no to pain or trouble breathing.  Objective: Vitals:   01/30/21 0650 01/30/21 1259 01/30/21 2123 01/31/21 0602  BP: (!) 161/59 (!) 139/55 (!) 165/82 (!) 156/67  Pulse: 60 60 (!) 59 60  Resp: 20 18 20 20   Temp: 97.7 F (36.5 C) 97.6 F (36.4 C) 98.1 F (36.7 C) 98.6 F (37 C)  TempSrc: Oral Axillary Oral Axillary  SpO2: 99% 100% 100% 100%  Weight:    83.8 kg  Height:        Intake/Output Summary (Last 24 hours) at 01/31/2021 1319 Last data filed at 01/31/2021 1100 Gross per 24 hour  Intake 1703 ml  Output 1450 ml  Net 253 ml   Filed Weights   01/29/21 0500 01/30/21 0500 01/31/21 0602  Weight: 81.8 kg 83.8 kg 83.8 kg    Examination:   GENERAL: No apparent distress.  Nontoxic. HEENT: MMM.  Vision and hearing grossly intact.  NECK: Supple.  No apparent JVD.  RESP: 100% on RA.  No IWOB.  Fair aeration bilaterally. CVS:  RRR. Heart sounds normal.  ABD/GI/GU:  BS+. Abd soft, NTND.  MSK/EXT:  Moves extremities. No apparent deformity.  Dependent edematous SKIN: Ruber thick skin in RUE.  No fluctuance, tenderness, erythema or increased warmth to touch. NEURO: Awake and alert.  Oriented to self and place.  Follows commands appropriately.  No apparent focal neuro deficit. PSYCH: Calm. Normal affect.  Procedures:  None  Microbiology summarized: 1/20-COVID-19 PCR positive. 1/20-blood cultures NGTD. 1/20-urine culture NGTD.  Assessment & Plan: Acute metabolic encephalopathy in the setting of AKI, hypernatremia and COVID-19 infection: Improving.  Able to walk with walker and converse with sister at baseline.  EEG, CT head and MRI brain negative for acute finding.  Neurology recommended treating treatable causes.  Improving. -Reorientation and delirium precautions. -Treat treatable causes.  Oropharyngeal dysphagia-removed NGT x2, and replaced for the third time -Upgraded to dysphagia 1 diet. -Appreciate help by dietitian.  Tube feed regimen adjusted to 18 hours a day to improve p.o. intake  Nonoliguric AKI on CKD-4: Likely prerenal from poor p.o. intake and dehydration.  Slowly improving. Recent Labs    01/20/21 0338 01/21/21 0422 01/22/21 0425 01/23/21 0425 01/24/21 0420 01/25/21 0429 01/27/21 0845 01/29/21 0422 01/30/21 0413 01/31/21 0359  BUN 59* 61* 59* 59* 60* 64* 63* 58* 61* 61*  CREATININE 2.81* 2.94* 2.67* 2.80* 2.63* 2.59* 2.46* 2.35* 2.38* 2.42*  -Renal ultrasound without acute finding. -Not a candidate for  HD per nephrology -Continue monitoring -Avoid nephrotoxic meds  Pancytopenia: Stable.  Not neutropenic.  FOBT negative. Anemia panel consistent with ACD.  Due to malnutrition? -Continue monitoring  Anasarca secondary to hypoalbuminemia Acute right upper extremity swelling: RUE negative for DVT.   -Seems to have improved with arm elevation and low-dose IV Lasix-continue. -Optimize nutrition  COVID-19 viral infection:  Tested positive on 01/02/2021.  Received remdesivir x3 doses. -No indication for airborne isolation  Hypernatremia/Hyperkalemia: Resolved.  Essential HTN: BP within fair range. -Continue Coreg and hydralazine -As needed hydralazine with parameters.  Chronic diastolic CHF: Stable -Continue low-dose IV Lasix -Monitor fluid and respiratory status closely.  History of CVA in 09/2020 with residual left hemiparesis and expressive aphasia: CT head and MRI brain without acute finding. -Continue Plavix, Lipitor via tube -PT has recommended SNF  Acute urinary retention: Seems to have resolved.  She had about 1.2 L UOP/24 hours. -Continue low-dose bethanechol -Monitor urinary output.  Goals of care: Multiple comorbidities as above.  Still full code which will pose more harm than benefit.  DNR/DNI recommended by multiple experts including palliative medicine in the past but family insisted on full code and full scope of care.   Body mass index is 28.94 kg/m. Nutrition Problem: Increased nutrient needs Etiology: acute illness (COVID-19 infection) Signs/Symptoms: estimated needs Interventions: Magic cup,Ensure Enlive (each supplement provides 350kcal and 20 grams of protein),Tube feeding Pressure Injury Sacrum Mid red open skin (Active)     Location: Sacrum  Location Orientation: Mid  Staging:   Wound Description (Comments): red open skin  Present on Admission: Yes   DVT prophylaxis:  heparin injection 5,000 Units Start: 01/15/21 2200 Place and maintain sequential compression device Start: 01/02/21 2308  Code Status: Full code Family Communication: Updated patient's sister over the phone on 2/16. Level of care: Med-Surg.  Status is: Inpatient  Remains inpatient appropriate because:Altered mental status, Unsafe d/c plan, IV treatments appropriate due to intensity of illness or inability to take PO and Inpatient level of care appropriate due to severity of  illness   Dispo:  Patient From: [place]  Planned Disposition: [place]  Expected discharge date: 02/03/2021  Medically stable for discharge: No         Consultants:  Nephrology Neurology Palliative medicine   Sch Meds:  Scheduled Meds: . atorvastatin  80 mg Per Tube Daily  . bethanechol  10 mg Oral TID  . carvedilol  6.25 mg Per NG tube BID WC  . chlorhexidine  15 mL Mouth Rinse BID  . Chlorhexidine Gluconate Cloth  6 each Topical Q0600  . dorzolamide-timolol  1 drop Both Eyes BID  . feeding supplement  237 mL Oral Q24H  . ferrous sulfate  300 mg Per Tube QODAY  . free water  200 mL Per Tube QID  . furosemide  20 mg Intravenous Daily  . heparin injection (subcutaneous)  5,000 Units Subcutaneous Q8H  . hydrALAZINE  50 mg Per Tube Q8H  . insulin aspart  0-9 Units Subcutaneous Q4H  . insulin glargine  5 Units Subcutaneous BID  . mouth rinse  15 mL Mouth Rinse q12n4p  . nystatin  5 mL Mouth/Throat QID  . pantoprazole sodium  40 mg Per Tube Daily   Continuous Infusions: . feeding supplement (NEPRO CARB STEADY)     PRN Meds:.hydrALAZINE, metoprolol tartrate  Antimicrobials: Anti-infectives (From admission, onward)   Start     Dose/Rate Route Frequency Ordered Stop   01/07/21 0800  ceFEPIme (MAXIPIME) 2 g  in sodium chloride 0.9 % 100 mL IVPB  Status:  Discontinued        2 g 200 mL/hr over 30 Minutes Intravenous Daily 01/06/21 1159 01/08/21 1257   01/05/21 1500  vancomycin (VANCOCIN) IVPB 1000 mg/200 mL premix  Status:  Discontinued        1,000 mg 200 mL/hr over 60 Minutes Intravenous Every 24 hours 01/05/21 1357 01/05/21 1414   01/05/21 1500  vancomycin (VANCOCIN) IVPB 1000 mg/200 mL premix  Status:  Discontinued        1,000 mg 200 mL/hr over 60 Minutes Intravenous Every 48 hours 01/05/21 1414 01/06/21 1146   01/05/21 1445  ceFEPIme (MAXIPIME) 2 g in sodium chloride 0.9 % 100 mL IVPB  Status:  Discontinued        2 g 200 mL/hr  over 30 Minutes Intravenous Daily 01/05/21 1351 01/06/21 1146   01/03/21 1000  remdesivir 100 mg in sodium chloride 0.9 % 100 mL IVPB  Status:  Discontinued       "Followed by" Linked Group Details   100 mg 200 mL/hr over 30 Minutes Intravenous Daily 01/02/21 2257 01/04/21 1330   01/02/21 2300  remdesivir 200 mg in sodium chloride 0.9% 250 mL IVPB       "Followed by" Linked Group Details   200 mg 580 mL/hr over 30 Minutes Intravenous Once 01/02/21 2257 01/03/21 0147       I have personally reviewed the following labs and images: CBC: Recent Labs  Lab 01/25/21 0429 01/27/21 0845 01/29/21 0422 01/30/21 0413 01/31/21 0359  WBC 3.7* 3.3* 2.6* 2.7* 2.6*  NEUTROABS 2.6 2.3 1.6* 1.6*  --   HGB 7.8* 8.2* 8.1* 7.7* 8.3*  HCT 26.6* 27.1* 25.5* 25.9* 27.5*  MCV 99.3 95.1 90.1 95.9 95.8  PLT 127* 134* 140* 145* 148*   BMP &GFR Recent Labs  Lab 01/25/21 0429 01/27/21 0845 01/29/21 0422 01/30/21 0413 01/31/21 0359  NA 146* 144 142 140 139  K 5.3* 4.6 4.3 4.1 4.3  CL 118* 111 110 107 107  CO2 22 22 24 24 24   GLUCOSE 136* 149* 92 110* 135*  BUN 64* 63* 58* 61* 61*  CREATININE 2.59* 2.46* 2.35* 2.38* 2.42*  CALCIUM 8.7* 8.6* 8.7* 8.6* 8.7*  MG  --   --   --  2.0 2.2  PHOS  --   --   --  3.9 4.1   Estimated Creatinine Clearance: 22 mL/min (A) (by C-G formula based on SCr of 2.42 mg/dL (H)). Liver & Pancreas: Recent Labs  Lab 01/25/21 0429 01/27/21 0845 01/29/21 0422 01/30/21 0413 01/31/21 0359  AST 30 32 30  --   --   ALT 60* 58* 46*  --   --   ALKPHOS 121 143* 142*  --   --   BILITOT 0.5 0.6 0.5  --   --   PROT 6.1* 6.4* 6.4*  --   --   ALBUMIN 2.3* 2.3* 2.2* 2.2* 2.4*   No results for input(s): LIPASE, AMYLASE in the last 168 hours. No results for input(s): AMMONIA in the last 168 hours. Diabetic: No results for input(s): HGBA1C in the last 72 hours. Recent Labs  Lab 01/30/21 1955 01/31/21 0034 01/31/21 0419 01/31/21 0750 01/31/21 1135  GLUCAP 144* 151* 122*  123* 112*   Cardiac Enzymes: No results for input(s): CKTOTAL, CKMB, CKMBINDEX, TROPONINI in the last 168 hours. No results for input(s): PROBNP in the last 8760 hours. Coagulation Profile: No results for input(s): INR, PROTIME in  the last 168 hours. Thyroid Function Tests: No results for input(s): TSH, T4TOTAL, FREET4, T3FREE, THYROIDAB in the last 72 hours. Lipid Profile: No results for input(s): CHOL, HDL, LDLCALC, TRIG, CHOLHDL, LDLDIRECT in the last 72 hours. Anemia Panel: No results for input(s): VITAMINB12, FOLATE, FERRITIN, TIBC, IRON, RETICCTPCT in the last 72 hours. Urine analysis:    Component Value Date/Time   COLORURINE YELLOW 01/02/2021 2053   APPEARANCEUR CLEAR 01/02/2021 2053   LABSPEC 1.014 01/02/2021 2053   PHURINE 5.0 01/02/2021 2053   GLUCOSEU 50 (A) 01/02/2021 2053   HGBUR NEGATIVE 01/02/2021 2053   BILIRUBINUR NEGATIVE 01/02/2021 2053   KETONESUR NEGATIVE 01/02/2021 2053   PROTEINUR >=300 (A) 01/02/2021 2053   NITRITE NEGATIVE 01/02/2021 2053   LEUKOCYTESUR NEGATIVE 01/02/2021 2053   Sepsis Labs: Invalid input(s): PROCALCITONIN, [place]  Microbiology: No results found for this or any previous visit (from the past 240 hour(s)).  Radiology Studies: No results found.   Desiree Pollard [place]  If 7PM-7AM, please contact night-coverage [email or web address] 01/31/2021, 1:19 PM

## 2021-02-01 DIAGNOSIS — T68XXXA Hypothermia, initial encounter: Secondary | ICD-10-CM

## 2021-02-01 DIAGNOSIS — G9341 Metabolic encephalopathy: Secondary | ICD-10-CM | POA: Diagnosis not present

## 2021-02-01 DIAGNOSIS — R338 Other retention of urine: Secondary | ICD-10-CM | POA: Diagnosis not present

## 2021-02-01 DIAGNOSIS — R1312 Dysphagia, oropharyngeal phase: Secondary | ICD-10-CM | POA: Diagnosis not present

## 2021-02-01 DIAGNOSIS — Z8673 Personal history of transient ischemic attack (TIA), and cerebral infarction without residual deficits: Secondary | ICD-10-CM | POA: Diagnosis not present

## 2021-02-01 LAB — CBC
HCT: 26.4 % — ABNORMAL LOW (ref 36.0–46.0)
Hemoglobin: 8 g/dL — ABNORMAL LOW (ref 12.0–15.0)
MCH: 29 pg (ref 26.0–34.0)
MCHC: 30.3 g/dL (ref 30.0–36.0)
MCV: 95.7 fL (ref 80.0–100.0)
Platelets: 149 10*3/uL — ABNORMAL LOW (ref 150–400)
RBC: 2.76 MIL/uL — ABNORMAL LOW (ref 3.87–5.11)
RDW: 15.2 % (ref 11.5–15.5)
WBC: 2.2 10*3/uL — ABNORMAL LOW (ref 4.0–10.5)
nRBC: 0 % (ref 0.0–0.2)

## 2021-02-01 LAB — RENAL FUNCTION PANEL
Albumin: 2.3 g/dL — ABNORMAL LOW (ref 3.5–5.0)
Anion gap: 9 (ref 5–15)
BUN: 66 mg/dL — ABNORMAL HIGH (ref 8–23)
CO2: 23 mmol/L (ref 22–32)
Calcium: 8.5 mg/dL — ABNORMAL LOW (ref 8.9–10.3)
Chloride: 106 mmol/L (ref 98–111)
Creatinine, Ser: 2.36 mg/dL — ABNORMAL HIGH (ref 0.44–1.00)
GFR, Estimated: 21 mL/min — ABNORMAL LOW (ref >60)
Glucose, Bld: 189 mg/dL — ABNORMAL HIGH (ref 70–99)
Phosphorus: 3.9 mg/dL (ref 2.5–4.6)
Potassium: 4.2 mmol/L (ref 3.5–5.1)
Sodium: 138 mmol/L (ref 135–145)

## 2021-02-01 LAB — MAGNESIUM: Magnesium: 2.1 mg/dL (ref 1.7–2.4)

## 2021-02-01 LAB — GLUCOSE, CAPILLARY
Glucose-Capillary: 125 mg/dL — ABNORMAL HIGH (ref 70–99)
Glucose-Capillary: 127 mg/dL — ABNORMAL HIGH (ref 70–99)
Glucose-Capillary: 146 mg/dL — ABNORMAL HIGH (ref 70–99)
Glucose-Capillary: 153 mg/dL — ABNORMAL HIGH (ref 70–99)
Glucose-Capillary: 164 mg/dL — ABNORMAL HIGH (ref 70–99)
Glucose-Capillary: 187 mg/dL — ABNORMAL HIGH (ref 70–99)
Glucose-Capillary: 192 mg/dL — ABNORMAL HIGH (ref 70–99)

## 2021-02-01 MED ORDER — FUROSEMIDE 10 MG/ML IJ SOLN: 40.0000 mg | Freq: Every day | INTRAMUSCULAR | Status: DC

## 2021-02-01 NOTE — Progress Notes (Signed)
   01/31/21 2137  Assess: MEWS Score  Temp (!) 94.4 F (34.7 C)  BP (!) 157/72  Pulse Rate 68  Resp (!) 22  Level of Consciousness Alert  SpO2 100 %  O2 Device Room Air  Assess: MEWS Score  MEWS Temp 2  MEWS Systolic 0  MEWS Pulse 0  MEWS RR 1  MEWS LOC 0  MEWS Score 3  MEWS Score Color Yellow  Assess: if the MEWS score is Yellow or Red  Were vital signs taken at a resting state? Yes  Focused Assessment No change from prior assessment  Early Detection of Sepsis Score *See Row Information* High  MEWS guidelines implemented *See Row Information* Yes  Treat  MEWS Interventions Escalated (See documentation below)  Pain Scale 0-10  Pain Score 0  Take Vital Signs  Increase Vital Sign Frequency  Yellow: Q 2hr X 2 then Q 4hr X 2, if remains yellow, continue Q 4hrs  Escalate  MEWS: Escalate Yellow: discuss with charge nurse/RN and consider discussing with provider and RRT  Notify: Charge Nurse/RN  Name of Charge Nurse/RN Notified Joana RN  Date Charge Nurse/RN Notified 01/31/21  Time Charge Nurse/RN Notified 2210  Notify: Provider  Provider Name/Title Jeannette Corpus NP  Date Provider Notified 01/31/21  Time Provider Notified 2210  Notification Type [place]  Notification Reason Other (Comment) (Yellow MEWS, Temp 94.4 Rectally)  Provider response See new orders  Date of Provider Response 01/31/21  Time of Provider Response 2210  Notify: Rapid Response  Name of Rapid Response RN Notified Genell RN  Date Rapid Response Notified 01/31/21  Time Rapid Response Notified 2220  Document  Patient Outcome Transferred/level of care increased  Progress note created (see row info) Yes

## 2021-02-01 NOTE — Progress Notes (Signed)
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  MRI brain negative for acute finding.  Hospital course complicated by AKI and dysphagia.  Nephrology did not feel patient is a candidate for HD and recommended DNR and possible hospice. She pulled out NGT twice but replaced for the third time.  SLP upgraded to dysphagia 1 diet.  Dietitian adjusted tube feeds to 18 hours regimen and encourage p.o. intake.  Optimistic to avoid PEG tube if possible.  Subjective: Seen and examined earlier this morning.  She had episode of hypothermia to 24 F on rectal temperature.  Hypothermia resolved with bear hugger.  She is somewhat sleepy this morning.  She is only oriented to self and follows some commands.  Objective: Vitals:   02/01/21 0328 02/01/21 0429 02/01/21 1311 02/01/21 1351  BP: (!) 170/66  (!) (P) 151/58   Pulse: 75  (P) 80   Resp: 20  (P) 18   Temp: 97.6 F (36.4 C)   98 F (36.7 C)  TempSrc: Oral   Rectal  SpO2: 98%  (P) 100%   Weight:  83 kg    Height:        Intake/Output Summary (Last 24 hours) at 02/01/2021 1448 Last data filed at 02/01/2021 0843 Gross per 24 hour  Intake 320 ml  Output 575 ml  Net -255 ml   Filed Weights   01/30/21 0500 01/31/21 0602 02/01/21 0429  Weight: 83.8 kg 83.8 kg 83 kg    Examination:   GENERAL: No apparent distress.  Nontoxic. HEENT: MMM.  Vision and hearing grossly intact.  NECK: Supple.  No apparent JVD.  RESP: On RA.  No IWOB.  Fair aeration bilaterally. CVS:  RRR. Heart sounds normal.  ABD/GI/GU: BS+. Abd soft, NTND.  MSK/EXT:  Moves extremities.  Dependent edema.  SKIN: Rubbery skin over  RUE.  No fluctuance, tenderness, erythema or increased warmth to touch. NEURO: Sleepy but wakes to voice.  Oriented to self.  No apparent focal neuro deficit. PSYCH: Calm.  No distress or agitation.  Procedures:  None  Microbiology summarized: 1/20-COVID-19 PCR positive. 1/20-blood cultures NGTD. 1/20-urine culture NGTD.  Assessment & Plan: Acute metabolic encephalopathy in the setting of AKI, hypernatremia and COVID-19 infection: Improving.  Able to walk with walker and converse with sister at baseline.  EEG, CT head and MRI brain negative for acute finding.  Neurology recommended treating treatable causes.  She is somewhat sleepy and only oriented to self today. -Reorientation and delirium precautions. -Treat treatable causes.  Oropharyngeal dysphagia-removed NGT x2, and replaced for the third time -Upgraded to dysphagia 1 diet. -Appreciate help by dietitian.  Tube feed regimen adjusted to 18 hours a day to improve p.o. intake  Nonoliguric AKI on CKD-4: Likely prerenal from poor p.o. intake and dehydration.  Slowly improving. Recent Labs    01/21/21 0422 01/22/21 0425 01/23/21 0425 01/24/21 0420 01/25/21 0429 01/27/21 0845 01/29/21 0422 01/30/21 0413 01/31/21 0359 02/01/21 0127  BUN 61* 59* 59* 60* 64* 63* 58* 61* 61* 66*  CREATININE 2.94* 2.67* 2.80* 2.63* 2.59* 2.46* 2.35* 2.38* 2.42* 2.36*  -Renal ultrasound without acute finding. -Not a candidate for HD per nephrology -Continue monitoring -Avoid  nephrotoxic meds  Pancytopenia: Stable.  Not neutropenic.  FOBT negative. Anemia panel consistent with ACD.  Due to malnutrition? -Continue monitoring  Anasarca secondary to hypoalbuminemia Acute right upper extremity swelling: RUE negative for DVT.   -Increase IV Lasix to 40 mg daily starting tomorrow -Right forearm CT without contrast  Hypothermia: Unclear record of this but resolved.  Leukopenic but no clear source of infection. -check right forearm CT without  contrast to exclude soft tissue infection.  COVID-19 viral infection: Tested positive on 01/02/2021.  Received remdesivir x3 doses. -No indication for airborne isolation  Hypernatremia/Hyperkalemia: Resolved.  Essential HTN: SBP elevated. -Continue Coreg and hydralazine -Increased IV Lasix to 40 mg daily. -As needed hydralazine with parameters.  Chronic diastolic CHF: Stable -Continue low-dose IV Lasix -Monitor fluid and respiratory status closely.  History of CVA in 09/2020 with residual left hemiparesis and expressive aphasia: CT head and MRI brain without acute finding. -Continue Plavix, Lipitor via tube -PT has recommended SNF  Acute urinary retention: Seems to have resolved.  She had about 1.2 L UOP/24 hours. -Continue low-dose bethanechol -Monitor urinary output.  Goals of care: Multiple comorbidities as above.  Still full code which will pose more harm than benefit.  DNR/DNI recommended by multiple experts including palliative medicine in the past but family insisted on full code and full scope of care.   Body mass index is 28.66 kg/m. Nutrition Problem: Increased nutrient needs Etiology: acute illness (COVID-19 infection) Signs/Symptoms: estimated needs Interventions: Magic cup,Ensure Enlive (each supplement provides 350kcal and 20 grams of protein),Tube feeding Pressure Injury Sacrum Mid red open skin (Active)     Location: Sacrum  Location Orientation: Mid  Staging:   Wound Description (Comments): red open skin  Present on Admission: Yes   DVT prophylaxis:  heparin injection 5,000 Units Start: 01/15/21 2200 Place and maintain sequential compression device Start: 01/02/21 2308  Code Status: Full code Family Communication: Updated patient's sister over the phone on 2/16. Level of care: Stepdown.  Status is: Inpatient  Remains inpatient appropriate because:Altered mental status, Unsafe d/c plan, IV treatments appropriate due to intensity of illness or  inability to take PO and Inpatient level of care appropriate due to severity of illness   Dispo:  Patient From: [place]  Planned Disposition: [place]  Expected discharge date: 02/03/2021  Medically stable for discharge: No         Consultants:  Nephrology Neurology Palliative medicine   Sch Meds:  Scheduled Meds: . atorvastatin  80 mg Per Tube Daily  . bethanechol  10 mg Oral TID  . carvedilol  6.25 mg Per NG tube BID WC  . chlorhexidine  15 mL Mouth Rinse BID  . Chlorhexidine Gluconate Cloth  6 each Topical Q0600  . dorzolamide-timolol  1 drop Both Eyes BID  . feeding supplement  237 mL Oral Q24H  . ferrous sulfate  300 mg Per Tube QODAY  . free water  200 mL Per Tube QID  . furosemide  20 mg Intravenous Daily  . heparin injection (subcutaneous)  5,000 Units Subcutaneous Q8H  . hydrALAZINE  50 mg Per Tube Q8H  . insulin aspart  0-9 Units Subcutaneous Q4H  . insulin glargine  5 Units Subcutaneous BID  . mouth rinse  15 mL Mouth Rinse q12n4p  . nystatin  5 mL Mouth/Throat QID  . pantoprazole sodium  40 mg Per Tube Daily   Continuous Infusions: . feeding supplement (NEPRO CARB STEADY) 1,000 mL (02/01/21 0502)   PRN Meds:.hydrALAZINE,  metoprolol tartrate  Antimicrobials: Anti-infectives (From admission, onward)   Start     Dose/Rate Route Frequency Ordered Stop   01/07/21 0800  ceFEPIme (MAXIPIME) 2 g in sodium chloride 0.9 % 100 mL IVPB  Status:  Discontinued        2 g 200 mL/hr over 30 Minutes Intravenous Daily 01/06/21 1159 01/08/21 1257   01/05/21 1500  vancomycin (VANCOCIN) IVPB 1000 mg/200 mL premix  Status:  Discontinued        1,000 mg 200 mL/hr over 60 Minutes Intravenous Every 24 hours 01/05/21 1357 01/05/21 1414   01/05/21 1500  vancomycin (VANCOCIN) IVPB 1000 mg/200 mL premix  Status:  Discontinued        1,000 mg 200 mL/hr over 60 Minutes Intravenous Every 48 hours 01/05/21 1414 01/06/21 1146   01/05/21 1445   ceFEPIme (MAXIPIME) 2 g in sodium chloride 0.9 % 100 mL IVPB  Status:  Discontinued        2 g 200 mL/hr over 30 Minutes Intravenous Daily 01/05/21 1351 01/06/21 1146   01/03/21 1000  remdesivir 100 mg in sodium chloride 0.9 % 100 mL IVPB  Status:  Discontinued       "Followed by" Linked Group Details   100 mg 200 mL/hr over 30 Minutes Intravenous Daily 01/02/21 2257 01/04/21 1330   01/02/21 2300  remdesivir 200 mg in sodium chloride 0.9% 250 mL IVPB       "Followed by" Linked Group Details   200 mg 580 mL/hr over 30 Minutes Intravenous Once 01/02/21 2257 01/03/21 0147       I have personally reviewed the following labs and images: CBC: Recent Labs  Lab 01/27/21 0845 01/29/21 0422 01/30/21 0413 01/31/21 0359 02/01/21 0127  WBC 3.3* 2.6* 2.7* 2.6* 2.2*  NEUTROABS 2.3 1.6* 1.6*  --   --   HGB 8.2* 8.1* 7.7* 8.3* 8.0*  HCT 27.1* 25.5* 25.9* 27.5* 26.4*  MCV 95.1 90.1 95.9 95.8 95.7  PLT 134* 140* 145* 148* 149*   BMP &GFR Recent Labs  Lab 01/27/21 0845 01/29/21 0422 01/30/21 0413 01/31/21 0359 02/01/21 0127  NA 144 142 140 139 138  K 4.6 4.3 4.1 4.3 4.2  CL 111 110 107 107 106  CO2 22 24 24 24 23   GLUCOSE 149* 92 110* 135* 189*  BUN 63* 58* 61* 61* 66*  CREATININE 2.46* 2.35* 2.38* 2.42* 2.36*  CALCIUM 8.6* 8.7* 8.6* 8.7* 8.5*  MG  --   --  2.0 2.2 2.1  PHOS  --   --  3.9 4.1 3.9   Estimated Creatinine Clearance: 22.5 mL/min (A) (by C-G formula based on SCr of 2.36 mg/dL (H)). Liver & Pancreas: Recent Labs  Lab 01/27/21 0845 01/29/21 0422 01/30/21 0413 01/31/21 0359 02/01/21 0127  AST 32 30  --   --   --   ALT 58* 46*  --   --   --   ALKPHOS 143* 142*  --   --   --   BILITOT 0.6 0.5  --   --   --   PROT 6.4* 6.4*  --   --   --   ALBUMIN 2.3* 2.2* 2.2* 2.4* 2.3*   No results for input(s): LIPASE, AMYLASE in the last 168 hours. No results for input(s): AMMONIA in the last 168 hours. Diabetic: No results for input(s): HGBA1C in the last 72 hours. Recent  Labs  Lab 01/31/21 2257 02/01/21 0007 02/01/21 0432 02/01/21 0729 02/01/21 1153  GLUCAP 184* 187* 164* 146*  125*   Cardiac Enzymes: No results for input(s): CKTOTAL, CKMB, CKMBINDEX, TROPONINI in the last 168 hours. No results for input(s): PROBNP in the last 8760 hours. Coagulation Profile: No results for input(s): INR, PROTIME in the last 168 hours. Thyroid Function Tests: No results for input(s): TSH, T4TOTAL, FREET4, T3FREE, THYROIDAB in the last 72 hours. Lipid Profile: No results for input(s): CHOL, HDL, LDLCALC, TRIG, CHOLHDL, LDLDIRECT in the last 72 hours. Anemia Panel: No results for input(s): VITAMINB12, FOLATE, FERRITIN, TIBC, IRON, RETICCTPCT in the last 72 hours. Urine analysis:    Component Value Date/Time   COLORURINE YELLOW 01/02/2021 2053   APPEARANCEUR CLEAR 01/02/2021 2053   LABSPEC 1.014 01/02/2021 2053   PHURINE 5.0 01/02/2021 2053   GLUCOSEU 50 (A) 01/02/2021 2053   HGBUR NEGATIVE 01/02/2021 2053   BILIRUBINUR NEGATIVE 01/02/2021 2053   KETONESUR NEGATIVE 01/02/2021 2053   PROTEINUR >=300 (A) 01/02/2021 2053   NITRITE NEGATIVE 01/02/2021 2053   LEUKOCYTESUR NEGATIVE 01/02/2021 2053   Sepsis Labs: Invalid input(s): PROCALCITONIN, [place]  Microbiology: No results found for this or any previous visit (from the past 240 hour(s)).  Radiology Studies: No results found.   [place] Lesure T. Bartlett  If 7PM-7AM, please contact night-coverage [email or web address] 02/01/2021, 2:48 PM

## 2021-02-01 NOTE — Progress Notes (Signed)
Earlier tonight,the vitals signs were taken : 94.83F(Rectal);HR68;RR22;157 72;100% Room Air. Initially Warm Blankets were placed on the patient.  Rapid Response was notified and the PCP was notified.  Rapid Response assessed/monitored  the patient and  A warming blanket Patent attorney) was placed on the patient and the patient was closely monitored.  Will continue to monitor the patient.

## 2021-02-01 NOTE — Progress Notes (Signed)
Warming Blanket removed at 4 am pt temperature has normalized at this time, other VSS.

## 2021-02-02 ENCOUNTER — Inpatient Hospital Stay (HOSPITAL_COMMUNITY): Payer: Medicare HMO

## 2021-02-02 DIAGNOSIS — R338 Other retention of urine: Secondary | ICD-10-CM | POA: Diagnosis not present

## 2021-02-02 DIAGNOSIS — Z8673 Personal history of transient ischemic attack (TIA), and cerebral infarction without residual deficits: Secondary | ICD-10-CM | POA: Diagnosis not present

## 2021-02-02 DIAGNOSIS — R1312 Dysphagia, oropharyngeal phase: Secondary | ICD-10-CM | POA: Diagnosis not present

## 2021-02-02 DIAGNOSIS — G9341 Metabolic encephalopathy: Secondary | ICD-10-CM | POA: Diagnosis not present

## 2021-02-02 LAB — RENAL FUNCTION PANEL
Albumin: 2.4 g/dL — ABNORMAL LOW (ref 3.5–5.0)
Anion gap: 10 (ref 5–15)
BUN: 73 mg/dL — ABNORMAL HIGH (ref 8–23)
CO2: 24 mmol/L (ref 22–32)
Calcium: 8.4 mg/dL — ABNORMAL LOW (ref 8.9–10.3)
Chloride: 104 mmol/L (ref 98–111)
Creatinine, Ser: 2.57 mg/dL — ABNORMAL HIGH (ref 0.44–1.00)
GFR, Estimated: 19 mL/min — ABNORMAL LOW (ref >60)
Glucose, Bld: 144 mg/dL — ABNORMAL HIGH (ref 70–99)
Phosphorus: 3 mg/dL (ref 2.5–4.6)
Potassium: 4 mmol/L (ref 3.5–5.1)
Sodium: 138 mmol/L (ref 135–145)

## 2021-02-02 LAB — GLUCOSE, CAPILLARY
Glucose-Capillary: 155 mg/dL — ABNORMAL HIGH (ref 70–99)
Glucose-Capillary: 112 mg/dL — ABNORMAL HIGH (ref 70–99)
Glucose-Capillary: 139 mg/dL — ABNORMAL HIGH (ref 70–99)
Glucose-Capillary: 147 mg/dL — ABNORMAL HIGH (ref 70–99)
Glucose-Capillary: 146 mg/dL — ABNORMAL HIGH (ref 70–99)
Glucose-Capillary: 164 mg/dL — ABNORMAL HIGH (ref 70–99)

## 2021-02-02 LAB — CBC
HCT: 25.1 % — ABNORMAL LOW (ref 36.0–46.0)
Hemoglobin: 7.8 g/dL — ABNORMAL LOW (ref 12.0–15.0)
MCH: 29.1 pg (ref 26.0–34.0)
MCHC: 31.1 g/dL (ref 30.0–36.0)
MCV: 93.7 fL (ref 80.0–100.0)
Platelets: 163 10*3/uL (ref 150–400)
RBC: 2.68 MIL/uL — ABNORMAL LOW (ref 3.87–5.11)
RDW: 15.7 % — ABNORMAL HIGH (ref 11.5–15.5)
WBC: 4.3 10*3/uL (ref 4.0–10.5)
nRBC: 0 % (ref 0.0–0.2)

## 2021-02-02 LAB — CK: Total CK: 41 U/L (ref 38–234)

## 2021-02-02 LAB — MAGNESIUM: Magnesium: 2.1 mg/dL (ref 1.7–2.4)

## 2021-02-02 IMAGING — CT CT FOREARM*R* W/O CM
2 of 8 series · 6 of 20 positions shown, 7 images · IV contrast (agent unspecified)
Comparison: None.

CLINICAL DATA: Diabetic patient with right arm swelling and pain.
Although the exam was ordered with contrast, contrast could not be
administered due to renal insufficiency.

EXAM:
CT OF THE RIGHT FOREARM WITHOUT CONTRAST; CT OF THE RIGHT HUMERUS
WITHOUT CONTRAST
TECHNIQUE: Multidetector CT imaging was performed according to the standard
protocol. Multiplanar CT image reconstructions were also generated.

[Series 9: ax bone · axial · 0.39mm/px · z∈[-833,-736]mm · 2 of 175 slices shown]
[im 59/175  bone]
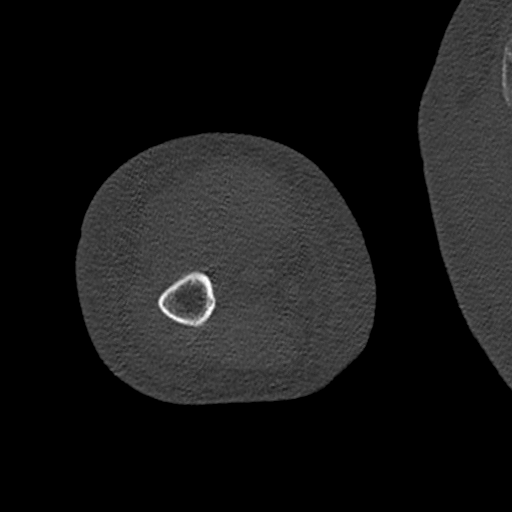
[im 117/175  bone]
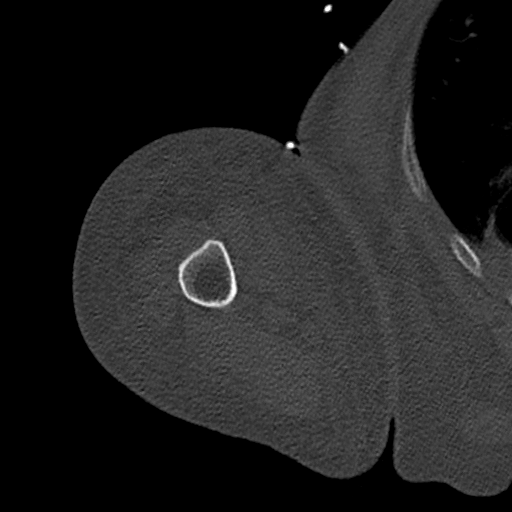

[Series 12: ax st · axial · 0.39mm/px · z∈[-876,-669]mm · 4 of 208 slices shown, 5 images]
[im 42/208  soft-tissue]
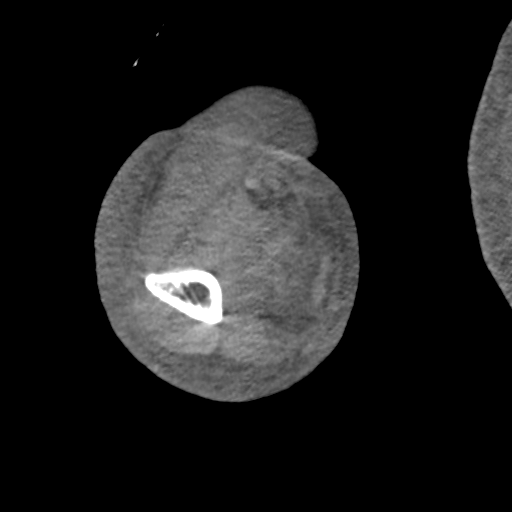
[im 42/208  bone]
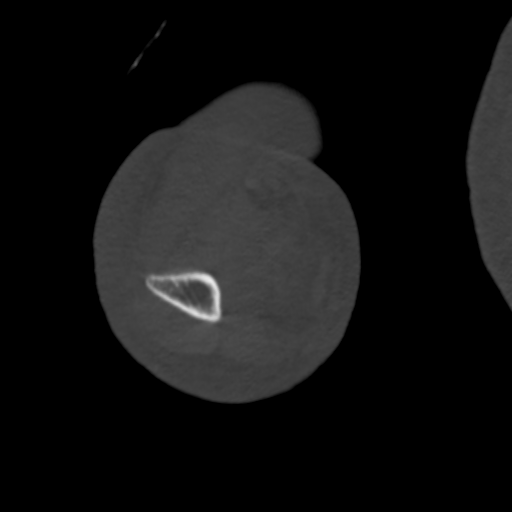
[im 83/208  bone]
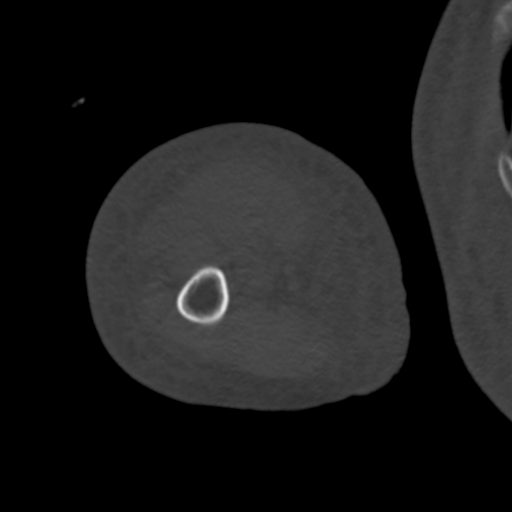
[im 125/208  bone]
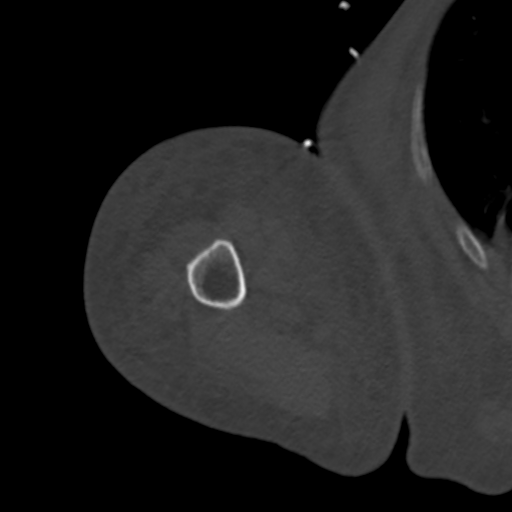
[im 166/208  bone]
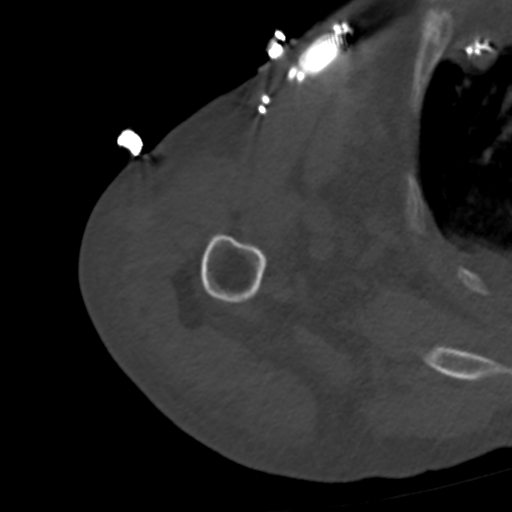

[6 of 20 positions shown; findings below may reference images not displayed]

FINDINGS: Bones/Joint/Cartilage

No acute bony or joint abnormality is seen. No bony destructive
change or periosteal reaction. No gas within bone. No joint
effusion.

Ligaments

Suboptimally assessed by CT.

Muscles and Tendons

No intramuscular fluid collection. No gas within muscle or tracking
along fascial planes. No fatty atrophy. No mass.

Soft tissues

Diffuse subcutaneous edema is present throughout the arm. No focal
fluid collection. Subcutaneous edema is also seen in the right
abdominal and chest wall. There is partial visualization of a small
appearing right pleural effusion.
IMPRESSION: Diffuse subcutaneous edema in the arm and right body wall is
compatible with cellulitis, lymphedema or volume overload/anasarca.

Negative for CT evidence of osteomyelitis, myositis or septic joint.

Partial visualization of a small appearing right pleural effusion.

## 2021-02-02 IMAGING — CT CT HUMERUS*R* W/O CM
3 of 4 series · 12 of 20 positions shown, 14 images · IV contrast (agent unspecified)
Comparison: None.

CLINICAL DATA: Diabetic patient with right arm swelling and pain.
Although the exam was ordered with contrast, contrast could not be
administered due to renal insufficiency.

EXAM:
CT OF THE RIGHT FOREARM WITHOUT CONTRAST; CT OF THE RIGHT HUMERUS
WITHOUT CONTRAST
TECHNIQUE: Multidetector CT imaging was performed according to the standard
protocol. Multiplanar CT image reconstructions were also generated.

[Series 9: ax bone · axial · 0.39mm/px · z∈[-873,-698]mm · 4 of 175 slices shown]
[im 35/175  bone]
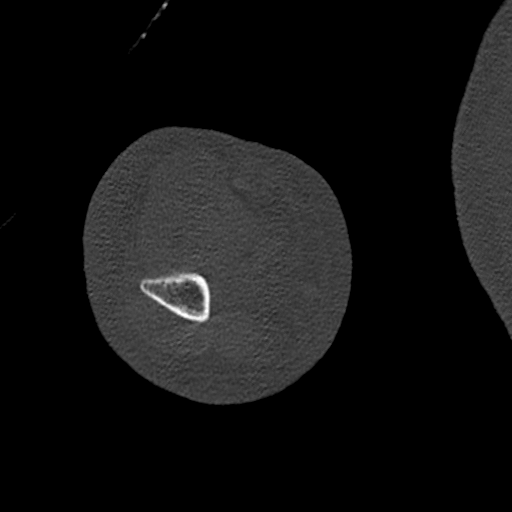
[im 70/175  bone]
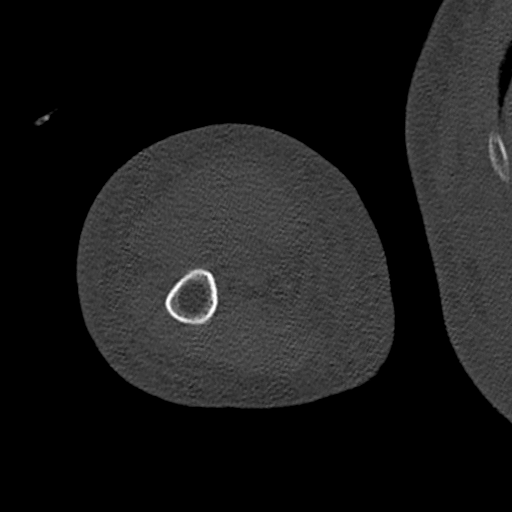
[im 105/175  bone]
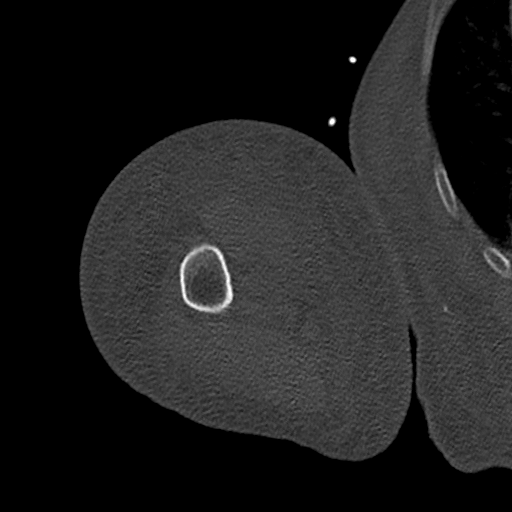
[im 140/175  bone]
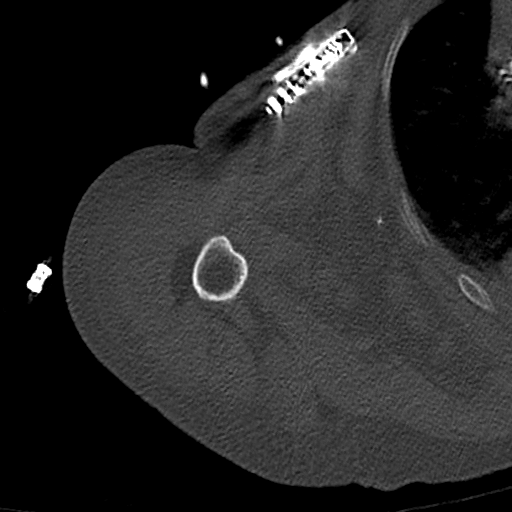

[Series 10: cor bone · coronal · 0.39mm/px · 2 of 101 slices shown]
[im 14/101  bone]
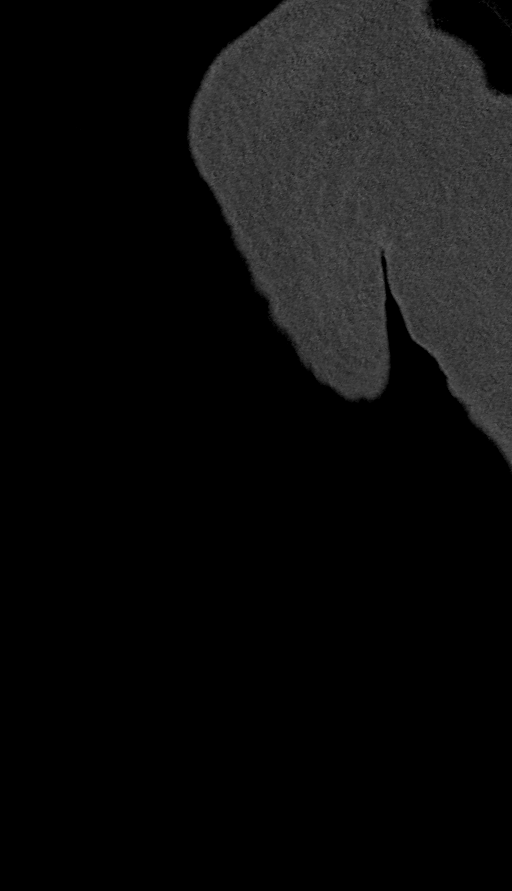
[im 57/101  bone]
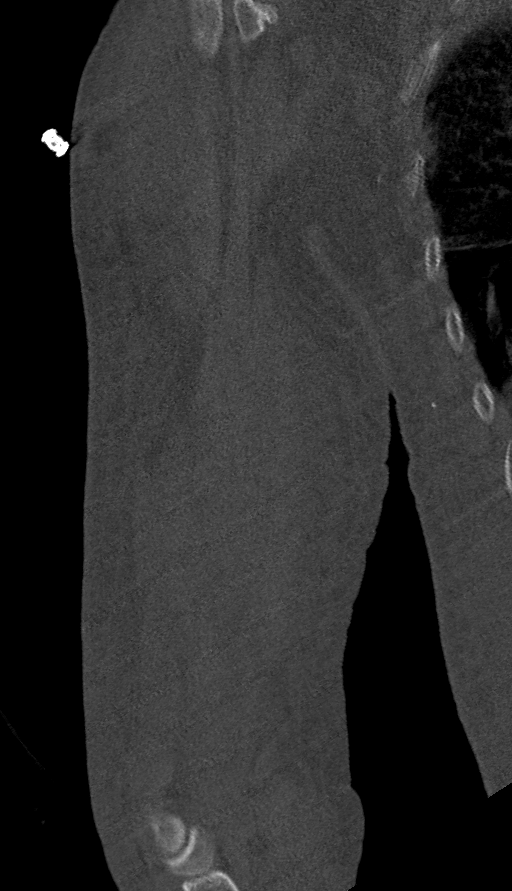

[Series 12: ax st · axial · 0.39mm/px · z∈[-896,-649]mm · 6 of 208 slices shown, 8 images]
[im 30/208  soft-tissue]
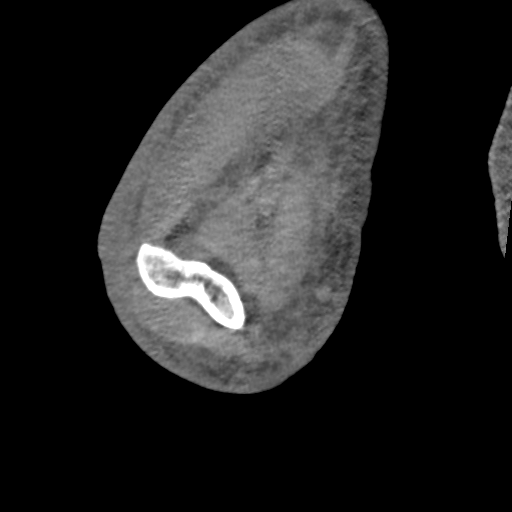
[im 30/208  bone]
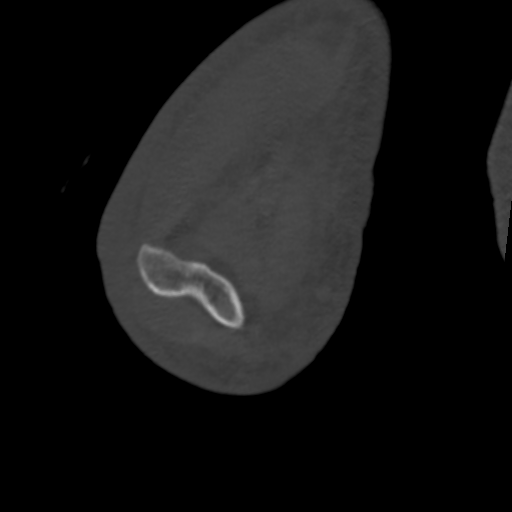
[im 60/208  bone]
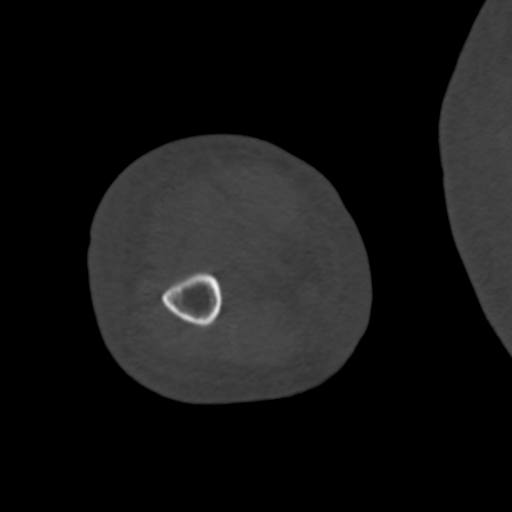
[im 89/208  bone]
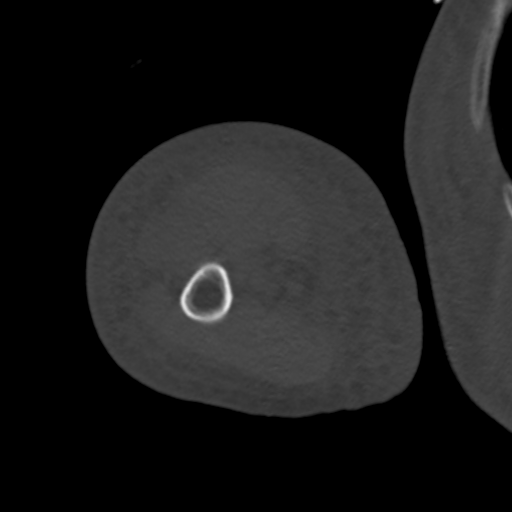
[im 119/208  bone]
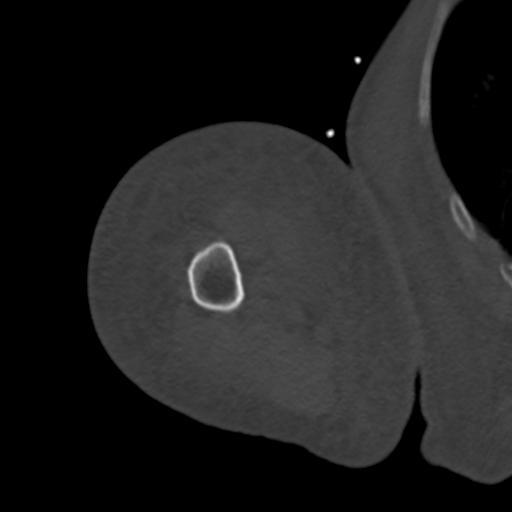
[im 148/208  soft-tissue]
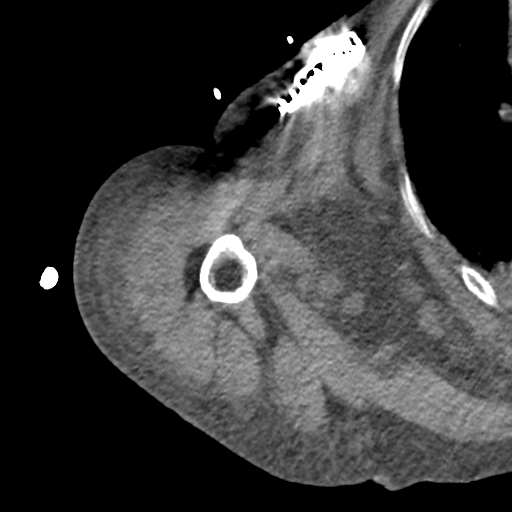
[im 148/208  bone]
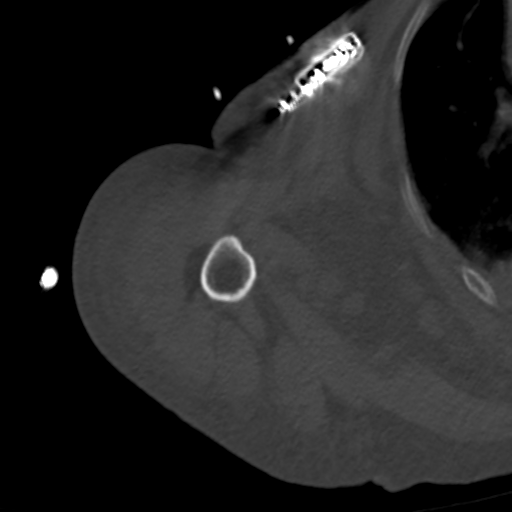
[im 178/208  bone]
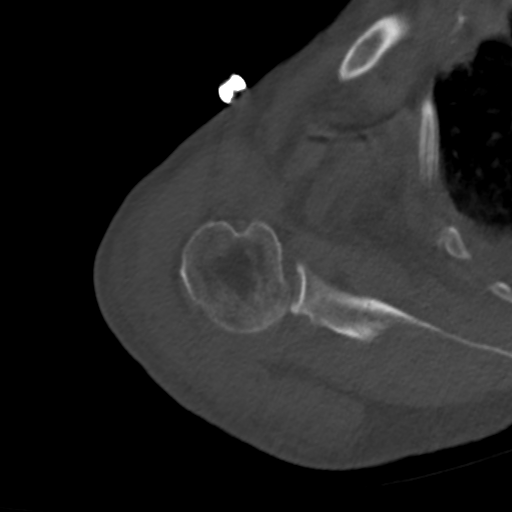

[12 of 20 positions shown; findings below may reference images not displayed]

FINDINGS: Bones/Joint/Cartilage

No acute bony or joint abnormality is seen. No bony destructive
change or periosteal reaction. No gas within bone. No joint
effusion.

Ligaments

Suboptimally assessed by CT.

Muscles and Tendons

No intramuscular fluid collection. No gas within muscle or tracking
along fascial planes. No fatty atrophy. No mass.

Soft tissues

Diffuse subcutaneous edema is present throughout the arm. No focal
fluid collection. Subcutaneous edema is also seen in the right
abdominal and chest wall. There is partial visualization of a small
appearing right pleural effusion.
IMPRESSION: Diffuse subcutaneous edema in the arm and right body wall is
compatible with cellulitis, lymphedema or volume overload/anasarca.

Negative for CT evidence of osteomyelitis, myositis or septic joint.

Partial visualization of a small appearing right pleural effusion.

## 2021-02-02 NOTE — Progress Notes (Signed)
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  MRI brain negative for acute finding.  Hospital course complicated by AKI and dysphagia.  Nephrology did not feel patient is a candidate for HD and recommended DNR and possible hospice. She pulled out NGT twice but replaced for the third time.  SLP upgraded to dysphagia 1 diet.  Dietitian adjusted tube feeds to 18 hours regimen and encourage p.o. intake.  Optimistic to avoid PEG tube if possible.  Subjective: Seen and examined earlier this morning.  No major events overnight of this morning.  She was awake but not quite alert.  She is oriented to self and follows some commands but not able to provide history.  She responds no to pain.  Objective: Vitals:   02/01/21 2024 02/02/21 0430 02/02/21 0500 02/02/21 1200  BP: (!) 147/55 (!) 153/95    Pulse:  76    Resp: (!) 23 18  (!) 26  Temp: 97.7 F (36.5 C) 97.7 F (36.5 C)  97.6 F (36.4 C)  TempSrc: Oral Oral  Axillary  SpO2:    98%  Weight:   84.3 kg   Height:        Intake/Output Summary (Last 24 hours) at 02/02/2021 1357 Last data filed at 02/02/2021 1027 Gross per 24 hour  Intake 5 ml  Output 1000 ml  Net -995 ml   Filed Weights   01/31/21 0602 02/01/21 0429 02/02/21 0500  Weight: 83.8 kg 83 kg 84.3 kg    Examination:   GENERAL: No apparent distress.  Chronically ill-appearing. HEENT: MMM.  Vision and hearing grossly intact.  NECK: Supple.  No apparent JVD.  RESP: On RA.  No IWOB.  Fair aeration bilaterally. CVS:  RRR. Heart sounds normal.  ABD/GI/GU: BS+. Abd soft, NTND.  MSK/EXT:  Moves extremities.  Dependent edema in  all extremities. SKIN: Rubber-thick skin in RUE.  No erythema, fluctuance or tenderness. NEURO: Awake but not alert.  Oriented to self.  Follows some commands.  No apparent focal neuro deficit but limited exam. PSYCH: Calm.  No distress or agitation.  Procedures:  None  Microbiology summarized: 1/20-COVID-19 PCR positive. 1/20-blood cultures NGTD. 1/20-urine culture NGTD.  Assessment & Plan: Acute metabolic encephalopathy in the setting of AKI, hypernatremia and COVID-19 infection: Seems to be waxing or waning.  Awake but not alert.  Only oriented to self.  Follows some commands.  Able to walk with walker and converse with sister at baseline.  Ammonia, TSH, B12, EEG, CT head and MRI brain negative for acute finding.  She has some azotemia which could contribute.  Neurology recommended treating treatable causes.  -Reorientation and delirium precautions. -Treat treatable causes. -Recheck ammonia, TSH and B12. -Check RPR.  Oropharyngeal dysphagia-removed NGT x2, and replaced for the third time -Upgraded to dysphagia 1 diet but mental status could be a.. -Tube feed regimen adjusted to 18 hours a day to improve p.o. intake  Nonoliguric AKI on CKD-4: Likely prerenal from poor p.o. intake and dehydration.  Slowly improving. Recent Labs    01/22/21 0425 01/23/21 0425 01/24/21 6578 01/25/21 4696 01/27/21 0845 01/29/21 0422 01/30/21 0413 01/31/21 0359 02/01/21 0127 02/02/21 0437  BUN 59* 59*  60* 64* 63* 58* 61* 61* 66* 73*  CREATININE 2.67* 2.80* 2.63* 2.59* 2.46* 2.35* 2.38* 2.42* 2.36* 2.57*  -Renal ultrasound without acute finding. -Not a candidate for HD per nephrology -Continue monitoring -Avoid nephrotoxic meds-discontinued IV Lasix  Pancytopenia: Leukopenia and thrombocytopenia resolved. FOBT negative. Anemia panel consistent with ACD.  Due to malnutrition? -Continue monitoring  Anasarca secondary to hypoalbuminemia Acute right upper extremity swelling: RUE negative for  DVT.  CT suggests anasarca. -Holding IV Lasix due to worsening renal function/azotemia -Optimize nutrition  Hypothermia: Unclear cause of this but resolved.   COVID-19 viral infection: Tested positive on 01/02/2021.  Received remdesivir x3 doses. -No indication for airborne isolation  Hypernatremia/Hyperkalemia: Resolved.  Essential HTN: SBP elevated. -Continue Coreg and hydralazine -As needed hydralazine with parameters.  Chronic diastolic CHF: Stable -Holding Lasix due to worsening renal function -Monitor fluid and respiratory status closely.  History of CVA in 09/2020 with residual left hemiparesis and expressive aphasia: CT head and MRI brain without acute finding. -Continue Plavix, Lipitor via tube -PT has recommended SNF  Acute urinary retention: Resolved. -Continue low-dose bethanechol -Monitor urinary output.  Goals of care: Multiple comorbidities as above.  Poor prognosis.  However, still full code which will pose more harm than benefit.  DNR/DNI recommended by multiple experts including palliative medicine in the past but family insisted on full code and full scope of care.  Difficult case  Overweight Body mass index is 29.11 kg/m. Nutrition Problem: Increased nutrient needs Etiology: acute illness (COVID-19 infection) Signs/Symptoms: estimated needs Interventions: Magic cup,Ensure Enlive (each supplement provides 350kcal and 20 grams of protein),Tube feeding Pressure Injury Sacrum Mid red open skin (Active)     Location: Sacrum  Location Orientation: Mid  Staging:   Wound Description (Comments): red open skin  Present on Admission: Yes   DVT prophylaxis:  heparin injection 5,000 Units Start: 01/15/21 2200 Place and maintain sequential compression device Start: 01/02/21 2308  Code Status: Full code Family Communication: No family member at bedside. Level of care: Stepdown.  Change to telemetry bed. Status is: Inpatient  Remains inpatient appropriate  because:Altered mental status, Unsafe d/c plan, IV treatments appropriate due to intensity of illness or inability to take PO and Inpatient level of care appropriate due to severity of illness   Dispo:  Patient From: [place]  Planned Disposition: [place]  Expected discharge date: 02/03/2021  Medically stable for discharge: No         Consultants:  Nephrology Neurology Palliative medicine   Sch Meds:  Scheduled Meds: . atorvastatin  80 mg Per Tube Daily  . bethanechol  10 mg Oral TID  . carvedilol  6.25 mg Per NG tube BID WC  . chlorhexidine  15 mL Mouth Rinse BID  . Chlorhexidine Gluconate Cloth  6 each Topical Q0600  . dorzolamide-timolol  1 drop Both Eyes BID  . feeding supplement  237 mL Oral Q24H  . ferrous sulfate  300 mg Per Tube QODAY  . free water  200 mL Per Tube QID  . heparin injection (subcutaneous)  5,000 Units Subcutaneous Q8H  . hydrALAZINE  50 mg Per Tube Q8H  . insulin aspart  0-9 Units Subcutaneous Q4H  . insulin glargine  5 Units Subcutaneous BID  . mouth rinse  15 mL Mouth Rinse q12n4p  . nystatin  5 mL Mouth/Throat QID  . pantoprazole sodium  40 mg Per Tube Daily   Continuous Infusions: . feeding supplement (NEPRO CARB STEADY) 1,000 mL (02/02/21 0426)  PRN Meds:.hydrALAZINE, metoprolol tartrate  Antimicrobials: Anti-infectives (From admission, onward)   Start     Dose/Rate Route Frequency Ordered Stop   01/07/21 0800  ceFEPIme (MAXIPIME) 2 g in sodium chloride 0.9 % 100 mL IVPB  Status:  Discontinued        2 g 200 mL/hr over 30 Minutes Intravenous Daily 01/06/21 1159 01/08/21 1257   01/05/21 1500  vancomycin (VANCOCIN) IVPB 1000 mg/200 mL premix  Status:  Discontinued        1,000 mg 200 mL/hr over 60 Minutes Intravenous Every 24 hours 01/05/21 1357 01/05/21 1414   01/05/21 1500  vancomycin (VANCOCIN) IVPB 1000 mg/200 mL premix  Status:  Discontinued        1,000 mg 200 mL/hr over 60 Minutes Intravenous  Every 48 hours 01/05/21 1414 01/06/21 1146   01/05/21 1445  ceFEPIme (MAXIPIME) 2 g in sodium chloride 0.9 % 100 mL IVPB  Status:  Discontinued        2 g 200 mL/hr over 30 Minutes Intravenous Daily 01/05/21 1351 01/06/21 1146   01/03/21 1000  remdesivir 100 mg in sodium chloride 0.9 % 100 mL IVPB  Status:  Discontinued       "Followed by" Linked Group Details   100 mg 200 mL/hr over 30 Minutes Intravenous Daily 01/02/21 2257 01/04/21 1330   01/02/21 2300  remdesivir 200 mg in sodium chloride 0.9% 250 mL IVPB       "Followed by" Linked Group Details   200 mg 580 mL/hr over 30 Minutes Intravenous Once 01/02/21 2257 01/03/21 0147       I have personally reviewed the following labs and images: CBC: Recent Labs  Lab 01/27/21 0845 01/29/21 0422 01/30/21 0413 01/31/21 0359 02/01/21 0127 02/02/21 0437  WBC 3.3* 2.6* 2.7* 2.6* 2.2* 4.3  NEUTROABS 2.3 1.6* 1.6*  --   --   --   HGB 8.2* 8.1* 7.7* 8.3* 8.0* 7.8*  HCT 27.1* 25.5* 25.9* 27.5* 26.4* 25.1*  MCV 95.1 90.1 95.9 95.8 95.7 93.7  PLT 134* 140* 145* 148* 149* 163   BMP &GFR Recent Labs  Lab 01/29/21 0422 01/30/21 0413 01/31/21 0359 02/01/21 0127 02/02/21 0437  NA 142 140 139 138 138  K 4.3 4.1 4.3 4.2 4.0  CL 110 107 107 106 104  CO2 24 24 24 23 24   GLUCOSE 92 110* 135* 189* 144*  BUN 58* 61* 61* 66* 73*  CREATININE 2.35* 2.38* 2.42* 2.36* 2.57*  CALCIUM 8.7* 8.6* 8.7* 8.5* 8.4*  MG  --  2.0 2.2 2.1 2.1  PHOS  --  3.9 4.1 3.9 3.0   Estimated Creatinine Clearance: 20.8 mL/min (A) (by C-G formula based on SCr of 2.57 mg/dL (H)). Liver & Pancreas: Recent Labs  Lab 01/27/21 0845 01/29/21 0422 01/30/21 0413 01/31/21 0359 02/01/21 0127 02/02/21 0437  AST 32 30  --   --   --   --   ALT 58* 46*  --   --   --   --   ALKPHOS 143* 142*  --   --   --   --   BILITOT 0.6 0.5  --   --   --   --   PROT 6.4* 6.4*  --   --   --   --   ALBUMIN 2.3* 2.2* 2.2* 2.4* 2.3* 2.4*   No results for input(s): LIPASE, AMYLASE in  the last 168 hours. No results for input(s): AMMONIA in the last 168 hours. Diabetic: No results for input(s):  HGBA1C in the last 72 hours. Recent Labs  Lab 02/01/21 2020 02/01/21 2342 02/02/21 0401 02/02/21 0732 02/02/21 1125  GLUCAP 192* 127* 155* 112* 139*   Cardiac Enzymes: Recent Labs  Lab 02/02/21 0437  CKTOTAL 41   No results for input(s): PROBNP in the last 8760 hours. Coagulation Profile: No results for input(s): INR, PROTIME in the last 168 hours. Thyroid Function Tests: No results for input(s): TSH, T4TOTAL, FREET4, T3FREE, THYROIDAB in the last 72 hours. Lipid Profile: No results for input(s): CHOL, HDL, LDLCALC, TRIG, CHOLHDL, LDLDIRECT in the last 72 hours. Anemia Panel: No results for input(s): VITAMINB12, FOLATE, FERRITIN, TIBC, IRON, RETICCTPCT in the last 72 hours. Urine analysis:    Component Value Date/Time   COLORURINE YELLOW 01/02/2021 2053   APPEARANCEUR CLEAR 01/02/2021 2053   LABSPEC 1.014 01/02/2021 2053   PHURINE 5.0 01/02/2021 2053   GLUCOSEU 50 (A) 01/02/2021 2053   HGBUR NEGATIVE 01/02/2021 2053   BILIRUBINUR NEGATIVE 01/02/2021 2053   KETONESUR NEGATIVE 01/02/2021 2053   PROTEINUR >=300 (A) 01/02/2021 2053   NITRITE NEGATIVE 01/02/2021 2053   LEUKOCYTESUR NEGATIVE 01/02/2021 2053   Sepsis Labs: Invalid input(s): PROCALCITONIN, [place]  Microbiology: No results found for this or any previous visit (from the past 240 hour(s)).  Radiology Studies: CT HUMERUS RIGHT WO CONTRAST  Result Date: 02/02/2021 CLINICAL DATA:  Diabetic patient with right arm swelling and pain. Although the exam was ordered with contrast, contrast could not be administered due to renal insufficiency. EXAM: CT OF THE RIGHT FOREARM WITHOUT CONTRAST; CT OF THE RIGHT HUMERUS WITHOUT CONTRAST TECHNIQUE: Multidetector CT imaging was performed according to the standard protocol. Multiplanar CT image reconstructions were also generated. COMPARISON:  None. FINDINGS:  Bones/Joint/Cartilage No acute bony or joint abnormality is seen. No bony destructive change or periosteal reaction. No gas within bone. No joint effusion. Ligaments Suboptimally assessed by CT. Muscles and Tendons No intramuscular fluid collection. No gas within muscle or tracking along fascial planes. No fatty atrophy. No mass. Soft tissues Diffuse subcutaneous edema is present throughout the arm. No focal fluid collection. Subcutaneous edema is also seen in the right abdominal and chest wall. There is partial visualization of a small appearing right pleural effusion. IMPRESSION: Diffuse subcutaneous edema in the arm and right body wall is compatible with cellulitis, lymphedema or volume overload/anasarca. Negative for CT evidence of osteomyelitis, myositis or septic joint. Partial visualization of a small appearing right pleural effusion. Electronically Signed   By: Inge Rise M.D.   On: 02/02/2021 12:55   CT FOREARM RIGHT WO CONTRAST  Result Date: 02/02/2021 CLINICAL DATA:  Diabetic patient with right arm swelling and pain. Although the exam was ordered with contrast, contrast could not be administered due to renal insufficiency. EXAM: CT OF THE RIGHT FOREARM WITHOUT CONTRAST; CT OF THE RIGHT HUMERUS WITHOUT CONTRAST TECHNIQUE: Multidetector CT imaging was performed according to the standard protocol. Multiplanar CT image reconstructions were also generated. COMPARISON:  None. FINDINGS: Bones/Joint/Cartilage No acute bony or joint abnormality is seen. No bony destructive change or periosteal reaction. No gas within bone. No joint effusion. Ligaments Suboptimally assessed by CT. Muscles and Tendons No intramuscular fluid collection. No gas within muscle or tracking along fascial planes. No fatty atrophy. No mass. Soft tissues Diffuse subcutaneous edema is present throughout the arm. No focal fluid collection. Subcutaneous edema is also seen in the right abdominal and chest wall. There is partial  visualization of a small appearing right pleural effusion. IMPRESSION: Diffuse subcutaneous edema in the arm  and right body wall is compatible with cellulitis, lymphedema or volume overload/anasarca. Negative for CT evidence of osteomyelitis, myositis or septic joint. Partial visualization of a small appearing right pleural effusion. Electronically Signed   By: Inge Rise M.D.   On: 02/02/2021 12:55     Korissa Horsford T. [place]  If 7PM-7AM, please contact night-coverage [email or web address] 02/02/2021, 1:57 PM

## 2021-02-03 DIAGNOSIS — Z8673 Personal history of transient ischemic attack (TIA), and cerebral infarction without residual deficits: Secondary | ICD-10-CM | POA: Diagnosis not present

## 2021-02-03 DIAGNOSIS — R1312 Dysphagia, oropharyngeal phase: Secondary | ICD-10-CM | POA: Diagnosis not present

## 2021-02-03 DIAGNOSIS — G9341 Metabolic encephalopathy: Secondary | ICD-10-CM | POA: Diagnosis not present

## 2021-02-03 DIAGNOSIS — R338 Other retention of urine: Secondary | ICD-10-CM | POA: Diagnosis not present

## 2021-02-03 LAB — GLUCOSE, CAPILLARY
Glucose-Capillary: 123 mg/dL — ABNORMAL HIGH (ref 70–99)
Glucose-Capillary: 131 mg/dL — ABNORMAL HIGH (ref 70–99)
Glucose-Capillary: 133 mg/dL — ABNORMAL HIGH (ref 70–99)
Glucose-Capillary: 150 mg/dL — ABNORMAL HIGH (ref 70–99)
Glucose-Capillary: 152 mg/dL — ABNORMAL HIGH (ref 70–99)
Glucose-Capillary: 97 mg/dL (ref 70–99)

## 2021-02-03 LAB — MAGNESIUM: Magnesium: 2.1 mg/dL (ref 1.7–2.4)

## 2021-02-03 LAB — RENAL FUNCTION PANEL
Albumin: 2.3 g/dL — ABNORMAL LOW (ref 3.5–5.0)
Anion gap: 9 (ref 5–15)
BUN: 78 mg/dL — ABNORMAL HIGH (ref 8–23)
CO2: 24 mmol/L (ref 22–32)
Calcium: 8.7 mg/dL — ABNORMAL LOW (ref 8.9–10.3)
Chloride: 104 mmol/L (ref 98–111)
Creatinine, Ser: 2.56 mg/dL — ABNORMAL HIGH (ref 0.44–1.00)
GFR, Estimated: 19 mL/min — ABNORMAL LOW (ref >60)
Glucose, Bld: 133 mg/dL — ABNORMAL HIGH (ref 70–99)
Phosphorus: 3.2 mg/dL (ref 2.5–4.6)
Potassium: 4 mmol/L (ref 3.5–5.1)
Sodium: 137 mmol/L (ref 135–145)

## 2021-02-03 LAB — HEMOGLOBIN AND HEMATOCRIT, BLOOD
HCT: 24.5 % — ABNORMAL LOW (ref 36.0–46.0)
Hemoglobin: 7.5 g/dL — ABNORMAL LOW (ref 12.0–15.0)

## 2021-02-03 MED ORDER — FUROSEMIDE 10 MG/ML IJ SOLN
80.0000 mg | Freq: Two times a day (BID) | INTRAMUSCULAR | Status: DC
  Administered 2021-02-03: 80 mg via INTRAVENOUS
  Filled 2021-02-03: qty 8

## 2021-02-03 NOTE — Progress Notes (Signed)
SLP Cancellation Note  Patient Details Name: Desiree Pollard MRN: [id number] DOB: 05-23-1946   Cancelled treatment:       Reason Eval/Treat Not Completed: Patient declined, no reason specified;Patient's level of consciousness.Patient very lethargic, would open eyes minimally and when asked about PO's she refused, replying "uh uh!"    [name], MA, CCC-SLP Speech Therapy

## 2021-02-03 NOTE — Progress Notes (Signed)
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  MRI brain negative for acute finding.  Hospital course complicated by AKI and dysphagia.  Nephrology did not feel patient is a candidate for HD and recommended DNR and possible hospice. She pulled out NGT twice but replaced for the third time.  SLP upgraded to dysphagia 1 diet.  Dietitian adjusted tube feeds to 18 hours regimen and encourage p.o. intake with the hope to avoid PEG tube but patient's mental status continued to deteriorate.  She also have progressive renal failure with azotemia and possible uremia.   Subjective: Seen and examined earlier this morning. She groans in response her name.  Not able to follow commands.  Objective: Vitals:   02/02/21 0430 02/02/21 0500 02/02/21 1200 02/03/21 0500  BP: (!) 153/95   (!) 157/72  Pulse: 76   68  Resp: 18  (!) 26 (!) 22  Temp: 97.7 F (36.5 C)  97.6 F (36.4 C) 98.2 F (36.8 C)  TempSrc: Oral  Axillary Oral  SpO2:   98% 99%  Weight:  84.3 kg  84.9 kg  Height:        Intake/Output Summary (Last 24 hours) at 02/03/2021 1207 Last data filed at 02/03/2021 0500 Gross per 24 hour  Intake 699 ml  Output 650 ml  Net 49 ml   Filed Weights   02/01/21 0429 02/02/21 0500 02/03/21 0500  Weight: 83 kg 84.3 kg 84.9 kg    Examination:   GENERAL: Chronically ill-appearing.  HEENT: MMM.  Vision and hearing grossly intact.  NECK: Supple.  No apparent JVD.  RESP: On RA.  No IWOB.  Fair aeration bilaterally. CVS:  RRR. Heart sounds normal.  ABD/GI/GU: BS+. Abd soft, NTND.  MSK/EXT: Edema in all extremities.  Anasarca. SKIN: Rubber-thick skin  in RUE.  No erythema, fluctuance or tenderness. NEURO: Moans in response to her name. Doesn't answer questions.  Does not follow command.  No apparent focal neuro deficit but limited exam due to mental status. PSYCH: Somnolent.  No distress or agitation.  Procedures:  None  Microbiology summarized: 1/20-COVID-19 PCR positive. 1/20-blood cultures NGTD. 1/20-urine culture NGTD.  Assessment & Plan: Acute metabolic encephalopathy in the setting of AKI, hypernatremia and COVID-19 infection: Progressive.  Only moans in response to her name.  Does not follow command.  Able to walk with walker and converse with sister at baseline.  Ammonia, TSH, B12, EEG, CT head and MRI brain negative for acute finding.  She has some azotemia which could contribute.  Neurology recommended treating treatable causes.  -Reorientation and delirium precautions. -Recheck ammonia, TSH and B12. -Check RPR.  Oropharyngeal dysphagia-removed NGT x2, and replaced for the third time -Upgraded to dysphagia 1 diet but not able to take p.o. due to deteriorating mental status. -Resume continuous tube feed -I am not sure if PEG tube can reverse the course.  I have discussed this with patient's sister over the phone.  Nonoliguric AKI on CKD-4: Progressive azotemia and possible uremia. Recent Labs    01/23/21 0425 01/24/21 0420 01/25/21 0429 01/27/21 0845 01/29/21 0422 01/30/21 0413 01/31/21 0359 02/01/21 0127 02/02/21 0437 02/03/21 0354  BUN 59* 60* 64* 63* 58*  61* 61* 66* 73* 78*  CREATININE 2.80* 2.63* 2.59* 2.46* 2.35* 2.38* 2.42* 2.36* 2.57* 2.56*  -Renal ultrasound without acute finding. -Not a candidate for HD per nephrology -Continue monitoring -We will try high dose lasix today  Pancytopenia: Leukopenia and thrombocytopenia resolved. FOBT negative. Anemia panel consistent with ACD.  Due to malnutrition? -Continue monitoring  Anasarca secondary to hypoalbuminemia Acute right upper extremity swelling: RUE  negative for DVT.  CT suggests anasarca. -Trial of high-dose Lasix -Optimize nutrition  Hypothermia: Unclear cause of this but resolved.   COVID-19 viral infection: Tested positive on 01/02/2021.  Received remdesivir x3 doses. -No indication for airborne isolation  Hypernatremia/Hyperkalemia: Resolved.  Essential HTN: SBP elevated. -Continue Coreg and hydralazine -Added IV Lasix today -As needed hydralazine with parameters.  Chronic diastolic CHF: Stable -Lasix 80 mg twice daily as above -Monitor fluid and respiratory status closely.  History of CVA in 09/2020 with residual left hemiparesis and expressive aphasia: CT head and MRI brain without acute finding. -Continue Plavix, Lipitor via tube -PT has recommended SNF  Acute urinary retention: Resolved. -Continue low-dose bethanechol -Monitor urinary output.  Goals of care: Multiple comorbidities as above.  Progressive decline.  Poor prognosis.  However, still full code which will pose more harm than benefit.  DNR/DNI recommended by multiple experts including palliative medicine in the past but family insisted on full code and full scope of care.  I have discussed patient's progressive decline with patient's sister, [name] over the phone.  Again, I recommended against DNR, DNI and tube feed.  [name] is undecided but will visit patient this afternoon and let us know.  Overweight Body mass index is 29.32 kg/m. Nutrition Problem: Increased nutrient needs Etiology: acute illness (COVID-19 infection) Signs/Symptoms: estimated needs Interventions: Magic cup,Ensure Enlive (each supplement provides 350kcal and 20 grams of protein),Tube feeding Pressure Injury Sacrum Mid red open skin (Active)     Location: Sacrum  Location Orientation: Mid  Staging:   Wound Description (Comments): red open skin  Present on Admission: Yes   DVT prophylaxis:  heparin injection 5,000 Units Start: 01/15/21 2200 Place and maintain sequential  compression device Start: 01/02/21 2308  Code Status: Full code Family Communication: Updated patient's sister, [name] over the phone. Level of care: Telemetry.  Status is: Inpatient  Remains inpatient appropriate because:Altered mental status, Unsafe d/c plan, IV treatments appropriate due to intensity of illness or inability to take PO and Inpatient level of care appropriate due to severity of illness   Dispo:  Patient From: [facility]  Planned Disposition: [place]  Expected discharge date: 02/06/2021  Medically stable for discharge: No         Consultants:  Nephrology Neurology Palliative medicine   Sch Meds:  Scheduled Meds: . atorvastatin  80 mg Per Tube Daily  . bethanechol  10 mg Oral TID  . carvedilol  6.25 mg Per NG tube BID WC  . chlorhexidine  15 mL Mouth Rinse BID  . Chlorhexidine Gluconate Cloth  6 each Topical Q0600  . dorzolamide-timolol  1 drop Both Eyes BID  . feeding supplement  237 mL Oral Q24H  . ferrous sulfate  300 mg Per Tube QODAY  . free water  200 mL Per Tube QID  . furosemide  80 mg Intravenous BID  . heparin injection (subcutaneous)  5,000 Units Subcutaneous Q8H  . hydrALAZINE  50 mg Per Tube Q8H  . insulin aspart  0-9 Units Subcutaneous Q4H  . insulin glargine  5 Units  Subcutaneous BID  . mouth rinse  15 mL Mouth Rinse q12n4p  . nystatin  5 mL Mouth/Throat QID  . pantoprazole sodium  40 mg Per Tube Daily   Continuous Infusions: . feeding supplement (NEPRO CARB STEADY) 1,000 mL (02/03/21 0100)   PRN Meds:.hydrALAZINE, metoprolol tartrate  Antimicrobials: Anti-infectives (From admission, onward)   Start     Dose/Rate Route Frequency Ordered Stop   01/07/21 0800  ceFEPIme (MAXIPIME) 2 g in sodium chloride 0.9 % 100 mL IVPB  Status:  Discontinued        2 g 200 mL/hr over 30 Minutes Intravenous Daily 01/06/21 1159 01/08/21 1257   01/05/21 1500  vancomycin (VANCOCIN) IVPB 1000 mg/200 mL premix  Status:   Discontinued        1,000 mg 200 mL/hr over 60 Minutes Intravenous Every 24 hours 01/05/21 1357 01/05/21 1414   01/05/21 1500  vancomycin (VANCOCIN) IVPB 1000 mg/200 mL premix  Status:  Discontinued        1,000 mg 200 mL/hr over 60 Minutes Intravenous Every 48 hours 01/05/21 1414 01/06/21 1146   01/05/21 1445  ceFEPIme (MAXIPIME) 2 g in sodium chloride 0.9 % 100 mL IVPB  Status:  Discontinued        2 g 200 mL/hr over 30 Minutes Intravenous Daily 01/05/21 1351 01/06/21 1146   01/03/21 1000  remdesivir 100 mg in sodium chloride 0.9 % 100 mL IVPB  Status:  Discontinued       "Followed by" Linked Group Details   100 mg 200 mL/hr over 30 Minutes Intravenous Daily 01/02/21 2257 01/04/21 1330   01/02/21 2300  remdesivir 200 mg in sodium chloride 0.9% 250 mL IVPB       "Followed by" Linked Group Details   200 mg 580 mL/hr over 30 Minutes Intravenous Once 01/02/21 2257 01/03/21 0147       I have personally reviewed the following labs and images: CBC: Recent Labs  Lab 01/29/21 0422 01/30/21 0413 01/31/21 0359 02/01/21 0127 02/02/21 0437 02/03/21 0354  WBC 2.6* 2.7* 2.6* 2.2* 4.3  --   NEUTROABS 1.6* 1.6*  --   --   --   --   HGB 8.1* 7.7* 8.3* 8.0* 7.8* 7.5*  HCT 25.5* 25.9* 27.5* 26.4* 25.1* 24.5*  MCV 90.1 95.9 95.8 95.7 93.7  --   PLT 140* 145* 148* 149* 163  --    BMP &GFR Recent Labs  Lab 01/30/21 0413 01/31/21 0359 02/01/21 0127 02/02/21 0437 02/03/21 0354  NA 140 139 138 138 137  K 4.1 4.3 4.2 4.0 4.0  CL 107 107 106 104 104  CO2 24 24 23 24 24   GLUCOSE 110* 135* 189* 144* 133*  BUN 61* 61* 66* 73* 78*  CREATININE 2.38* 2.42* 2.36* 2.57* 2.56*  CALCIUM 8.6* 8.7* 8.5* 8.4* 8.7*  MG 2.0 2.2 2.1 2.1 2.1  PHOS 3.9 4.1 3.9 3.0 3.2   Estimated Creatinine Clearance: 20.9 mL/min (A) (by C-G formula based on SCr of 2.56 mg/dL (H)). Liver & Pancreas: Recent Labs  Lab 01/29/21 0422 01/30/21 0413 01/31/21 0359 02/01/21 0127 02/02/21 0437 02/03/21 0354  AST 30  --    --   --   --   --   ALT 46*  --   --   --   --   --   ALKPHOS 142*  --   --   --   --   --   BILITOT 0.5  --   --   --   --   --  PROT 6.4*  --   --   --   --   --   ALBUMIN 2.2* 2.2* 2.4* 2.3* 2.4* 2.3*   No results for input(s): LIPASE, AMYLASE in the last 168 hours. No results for input(s): AMMONIA in the last 168 hours. Diabetic: No results for input(s): HGBA1C in the last 72 hours. Recent Labs  Lab 02/02/21 2016 02/02/21 2155 02/03/21 0028 02/03/21 0352 02/03/21 0810  GLUCAP 146* 164* 152* 123* 131*   Cardiac Enzymes: Recent Labs  Lab 02/02/21 0437  CKTOTAL 41   No results for input(s): PROBNP in the last 8760 hours. Coagulation Profile: No results for input(s): INR, PROTIME in the last 168 hours. Thyroid Function Tests: No results for input(s): TSH, T4TOTAL, FREET4, T3FREE, THYROIDAB in the last 72 hours. Lipid Profile: No results for input(s): CHOL, HDL, LDLCALC, TRIG, CHOLHDL, LDLDIRECT in the last 72 hours. Anemia Panel: No results for input(s): VITAMINB12, FOLATE, FERRITIN, TIBC, IRON, RETICCTPCT in the last 72 hours. Urine analysis:    Component Value Date/Time   COLORURINE YELLOW 01/02/2021 2053   APPEARANCEUR CLEAR 01/02/2021 2053   LABSPEC 1.014 01/02/2021 2053   PHURINE 5.0 01/02/2021 2053   GLUCOSEU 50 (A) 01/02/2021 2053   HGBUR NEGATIVE 01/02/2021 2053   BILIRUBINUR NEGATIVE 01/02/2021 2053   KETONESUR NEGATIVE 01/02/2021 2053   PROTEINUR >=300 (A) 01/02/2021 2053   NITRITE NEGATIVE 01/02/2021 2053   LEUKOCYTESUR NEGATIVE 01/02/2021 2053   Sepsis Labs: Invalid input(s): PROCALCITONIN, [place]  Microbiology: No results found for this or any previous visit (from the past 240 hour(s)).  Radiology Studies: CT HUMERUS RIGHT WO CONTRAST  Result Date: 02/02/2021 CLINICAL DATA:  Diabetic patient with right arm swelling and pain. Although the exam was ordered with contrast, contrast could not be administered due to renal insufficiency. EXAM:  CT OF THE RIGHT FOREARM WITHOUT CONTRAST; CT OF THE RIGHT HUMERUS WITHOUT CONTRAST TECHNIQUE: Multidetector CT imaging was performed according to the standard protocol. Multiplanar CT image reconstructions were also generated. COMPARISON:  None. FINDINGS: Bones/Joint/Cartilage No acute bony or joint abnormality is seen. No bony destructive change or periosteal reaction. No gas within bone. No joint effusion. Ligaments Suboptimally assessed by CT. Muscles and Tendons No intramuscular fluid collection. No gas within muscle or tracking along fascial planes. No fatty atrophy. No mass. Soft tissues Diffuse subcutaneous edema is present throughout the arm. No focal fluid collection. Subcutaneous edema is also seen in the right abdominal and chest wall. There is partial visualization of a small appearing right pleural effusion. IMPRESSION: Diffuse subcutaneous edema in the arm and right body wall is compatible with cellulitis, lymphedema or volume overload/anasarca. Negative for CT evidence of osteomyelitis, myositis or septic joint. Partial visualization of a small appearing right pleural effusion. Electronically Signed   By: Inge Rise M.D.   On: 02/02/2021 12:55   CT FOREARM RIGHT WO CONTRAST  Result Date: 02/02/2021 CLINICAL DATA:  Diabetic patient with right arm swelling and pain. Although the exam was ordered with contrast, contrast could not be administered due to renal insufficiency. EXAM: CT OF THE RIGHT FOREARM WITHOUT CONTRAST; CT OF THE RIGHT HUMERUS WITHOUT CONTRAST TECHNIQUE: Multidetector CT imaging was performed according to the standard protocol. Multiplanar CT image reconstructions were also generated. COMPARISON:  None. FINDINGS: Bones/Joint/Cartilage No acute bony or joint abnormality is seen. No bony destructive change or periosteal reaction. No gas within bone. No joint effusion. Ligaments Suboptimally assessed by CT. Muscles and Tendons No intramuscular fluid collection. No gas within  muscle or  tracking along fascial planes. No fatty atrophy. No mass. Soft tissues Diffuse subcutaneous edema is present throughout the arm. No focal fluid collection. Subcutaneous edema is also seen in the right abdominal and chest wall. There is partial visualization of a small appearing right pleural effusion. IMPRESSION: Diffuse subcutaneous edema in the arm and right body wall is compatible with cellulitis, lymphedema or volume overload/anasarca. Negative for CT evidence of osteomyelitis, myositis or septic joint. Partial visualization of a small appearing right pleural effusion. Electronically Signed   By: Inge Rise M.D.   On: 02/02/2021 12:55     Tore Carreker T. Kinde  If 7PM-7AM, please contact night-coverage [email or web address] 02/03/2021, 12:07 PM

## 2021-02-04 DIAGNOSIS — Z8673 Personal history of transient ischemic attack (TIA), and cerebral infarction without residual deficits: Secondary | ICD-10-CM | POA: Diagnosis not present

## 2021-02-04 DIAGNOSIS — R338 Other retention of urine: Secondary | ICD-10-CM | POA: Diagnosis not present

## 2021-02-04 DIAGNOSIS — G9341 Metabolic encephalopathy: Secondary | ICD-10-CM | POA: Diagnosis not present

## 2021-02-04 DIAGNOSIS — R1312 Dysphagia, oropharyngeal phase: Secondary | ICD-10-CM | POA: Diagnosis not present

## 2021-02-04 LAB — AMMONIA: Ammonia: 44 umol/L — ABNORMAL HIGH (ref 9–35)

## 2021-02-04 LAB — HEMOGLOBIN AND HEMATOCRIT, BLOOD
HCT: 26.9 % — ABNORMAL LOW (ref 36.0–46.0)
Hemoglobin: 8 g/dL — ABNORMAL LOW (ref 12.0–15.0)

## 2021-02-04 LAB — RENAL FUNCTION PANEL
Albumin: 2.4 g/dL — ABNORMAL LOW (ref 3.5–5.0)
Anion gap: 11 (ref 5–15)
BUN: 80 mg/dL — ABNORMAL HIGH (ref 8–23)
CO2: 22 mmol/L (ref 22–32)
Calcium: 8.9 mg/dL (ref 8.9–10.3)
Chloride: 103 mmol/L (ref 98–111)
Creatinine, Ser: 2.73 mg/dL — ABNORMAL HIGH (ref 0.44–1.00)
GFR, Estimated: 17 mL/min — ABNORMAL LOW (ref >60)
Glucose, Bld: 168 mg/dL — ABNORMAL HIGH (ref 70–99)
Phosphorus: 3.8 mg/dL (ref 2.5–4.6)
Potassium: 4 mmol/L (ref 3.5–5.1)
Sodium: 136 mmol/L (ref 135–145)

## 2021-02-04 LAB — GLUCOSE, CAPILLARY
Glucose-Capillary: 123 mg/dL — ABNORMAL HIGH (ref 70–99)
Glucose-Capillary: 148 mg/dL — ABNORMAL HIGH (ref 70–99)
Glucose-Capillary: 149 mg/dL — ABNORMAL HIGH (ref 70–99)
Glucose-Capillary: 153 mg/dL — ABNORMAL HIGH (ref 70–99)
Glucose-Capillary: 157 mg/dL — ABNORMAL HIGH (ref 70–99)
Glucose-Capillary: 157 mg/dL — ABNORMAL HIGH (ref 70–99)
Glucose-Capillary: 162 mg/dL — ABNORMAL HIGH (ref 70–99)

## 2021-02-04 LAB — VITAMIN B12: Vitamin B-12: 745 pg/mL (ref 180–914)

## 2021-02-04 LAB — RPR: RPR Ser Ql: NONREACTIVE

## 2021-02-04 LAB — MAGNESIUM: Magnesium: 2.3 mg/dL (ref 1.7–2.4)

## 2021-02-04 LAB — TSH: TSH: 3.124 u[IU]/mL (ref 0.350–4.500)

## 2021-02-04 MED ORDER — FUROSEMIDE 10 MG/ML IJ SOLN
60.0000 mg | Freq: Two times a day (BID) | INTRAMUSCULAR | Status: DC
  Administered 2021-02-04 – 2021-02-06 (×4): 60 mg via INTRAVENOUS
  Filled 2021-02-04 (×4): qty 6

## 2021-02-04 MED ORDER — LACTULOSE 10 GM/15ML PO SOLN
20.0000 g | Freq: Two times a day (BID) | ORAL | Status: DC
  Administered 2021-02-04 – 2021-02-19 (×29): 20 g
  Filled 2021-02-04 (×29): qty 30

## 2021-02-04 NOTE — Progress Notes (Signed)
Chaplain providing support at bedside with pt and family in response to family request / RN Goehring.    Pt's sister Desiree Pollard met with chaplain and MD.  States that pt is legally married to spouse, but has lived with Desiree Pollard since discharge from rehab last year.  She describes that pt and spouse are "separated."    Chaplain provided education around AD - noting that pt would need to demonstrate alert and oriented / sound mind in order to notarize.  Explained that Desiree Pollard's spouse is default HCPOA until paperwork is in place. Desiree Pollard expressed understanding.   Pt awoke during encounter.  Chaplain engaged in brief competency assessment wherein pt was oriented to self, and place - noting that she was in hospital.  When asked open question of who she would want to make decisions, she states "Desiree Pollard" on several occasions.    Desiree Pollard's sister wishes to attempt to help pt make advance directive tomorrow in a time where pt is awake.  This chaplain will follow-up with more complete competency assessment at that time.     , MDiv, BCC 

## 2021-02-04 NOTE — Plan of Care (Signed)

## 2021-02-04 NOTE — Progress Notes (Signed)
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  MRI brain negative for acute finding.  Hospital course complicated by AKI and dysphagia.  Nephrology did not feel patient is a candidate for HD and recommended DNR and possible hospice. She pulled out NGT twice but replaced for the third time.  SLP upgraded to dysphagia 1 diet.  Dietitian adjusted tube feeds to 18 hours regimen and encourage p.o. intake with the hope to avoid PEG tube but patient's mental status remain poor.  She also have progressive renal failure with azotemia and possible uremia. Reportedly separated from spouse about a year ago. Sister trying to get HCPOA done here.    Subjective: Seen and examined earlier this morning.  She was awake and oriented to self and "hospital" but thinks she is in [place].  No complaints.  She denies pain.  She follows some commands.  Objective: Vitals:   02/04/21 0430 02/04/21 0454 02/04/21 1332 02/04/21 1440  BP: 139/61  (!) 128/50 132/60  Pulse: 65  61   Resp: 20  (!) 26 19  Temp: (!) 96 F (35.6 C)  97.6 F (36.4 C)   TempSrc: Rectal  Oral   SpO2: 98%  97%   Weight:  84.6 kg    Height:        Intake/Output Summary (Last 24 hours) at 02/04/2021 1733 Last data filed at 02/04/2021 0455 Gross per 24 hour  Intake 466 ml  Output 750 ml  Net -284 ml   Filed Weights   02/02/21 0500 02/03/21 0500 02/04/21 0454  Weight: 84.3 kg 84.9 kg 84.6 kg    Examination:   GENERAL: Chronically ill-appearing.  No apparent distress. HEENT: MMM.  Vision and hearing grossly intact.  NECK: Supple.  No apparent JVD.  RESP: On RA.  No IWOB.  Fair  aeration bilaterally. CVS:  RRR. Heart sounds normal.  ABD/GI/GU: BS+. Abd soft, NTND.  MSK/EXT: Edema/anasarca in all extremities, more pronounced today in RUE. SKIN: no apparent skin lesion or wound NEURO: Awake.  Oriented to self and "hospital" but thinks she is in [place].  Follows some commands.  No apparent focal neuro deficit but limited exam.  PSYCH: Calm.  No distress or agitation.  Procedures:  None  Microbiology summarized: 1/20-COVID-19 PCR positive. 1/20-blood cultures NGTD. 1/20-urine culture NGTD.  Assessment & Plan: Acute metabolic encephalopathy/delirium in patient with history of dementia-per sister, able to walk with walker and converse at baseline.  She now have significant azotemia concerning for uremia.  TSH, B12, EEG, CT head and MRI brain negative for acute finding. Neurology recommended treating treatable causes.  -Reorientation and delirium precautions. -Repeat ammonia slightly elevated.  Start lactulose 20 g twice daily -Check RPR.  Oropharyngeal dysphagia-removed NGT x2, and replaced for the third time -Upgraded to dysphagia 1 diet but not able to take p.o. due to deteriorating mental status. -Resume continuous tube feed -I am not sure if PEG tube can reverse the course.  I have discussed this with patient's sister over the phone.  Nonoliguric AKI on CKD-4: Progressive azotemia and possible uremia. Recent Labs    01/24/21 0420 01/25/21 0429 01/27/21 0845 01/29/21 0422 01/30/21 0413 01/31/21  4765 02/01/21 0127 02/02/21 0437 02/03/21 0354 02/04/21 0418  BUN 60* 64* 63* 58* 61* 61* 66* 73* 78* 80*  CREATININE 2.63* 2.59* 2.46* 2.35* 2.38* 2.42* 2.36* 2.57* 2.56* 2.73*  -Renal ultrasound without acute finding. -Not a candidate for HD per nephrology -IV Lasix 60 mg twice daily-discussed risk and benefit with patient's sister at bedside  Pancytopenia: Leukopenia and thrombocytopenia resolved. FOBT negative. Anemia panel consistent with ACD.  Due  to malnutrition? -Continue monitoring  Anasarca secondary to hypoalbuminemia Acute right upper extremity swelling: RUE negative for DVT.  CT suggests anasarca. -IV Lasix as above -Optimize nutrition  Hypothermia: Unclear cause of this but resolved.   COVID-19 viral infection: Tested positive on 01/02/2021.  Received remdesivir x3 doses. -No indication for airborne isolation  Hypernatremia/Hyperkalemia: Resolved.  Essential HTN: SBP elevated. -Continue Coreg and hydralazine -As needed hydralazine with parameters.  Chronic diastolic CHF: No cardiopulmonary symptoms but anasarca. -IV Lasix as above -Monitor fluid and respiratory status closely.  History of CVA in 09/2020 with residual left hemiparesis and expressive aphasia: CT head and MRI brain without acute finding. -Continue Plavix, Lipitor via tube -PT has recommended SNF  Acute urinary retention: Resolved. -Monitor urinary output.  Goals of care: Multiple comorbidities as above.  Progressive decline.  Poor prognosis.  However, still full code which will pose more harm than benefit.  DNR/DNI recommended by multiple experts including palliative medicine in the past but patient's sister, [name] insisted on full code and full scope of care. I have discussed patient's progressive decline with Ms. cheek at bedside.  [name] is trying to obtain [facility] POA.  [name] states [name] and [name] were "separated" about a year ago when she went into nursing home.  [name] lived with Ms. Cheek when she was out of nursing home since then.  I attempted to call [name] since he is still listed as spouse in our system but  went to voicemail. I offered palliative care revisit but she declined. May need to involve ethic  Overweight Body mass index is 29.21 kg/m. Nutrition Problem: Increased nutrient needs Etiology: acute illness (COVID-19 infection) Signs/Symptoms: estimated needs Interventions: Magic cup,Ensure Enlive (each supplement  provides 350kcal and 20 grams of protein),Tube feeding Pressure Injury Sacrum Mid red open skin (Active)     Location: Sacrum  Location Orientation: Mid  Staging:   Wound Description (Comments): red open skin  Present on Admission: Yes   DVT prophylaxis:  heparin injection 5,000 Units Start: 01/15/21 2200 Place and maintain sequential compression device Start: 01/02/21 2308  Code Status: Full code Family Communication: Updated patient's sister, Ms. Cheek at bedside Level of care: Telemetry.  Status is: Inpatient  Remains inpatient appropriate because:Altered mental status, Unsafe d/c plan, IV treatments appropriate due to intensity of illness or inability to take PO and Inpatient level of care appropriate due to severity of illness   Dispo:  Patient From: [facility]  Planned Disposition: [place]  Expected discharge date: 02/06/2021  Medically stable for discharge: No         Consultants:  Nephrology Neurology Palliative medicine   Sch Meds:  Scheduled Meds: . atorvastatin  80 mg Per Tube Daily  . carvedilol  6.25 mg Per NG tube BID WC  . chlorhexidine  15 mL Mouth Rinse BID  . Chlorhexidine Gluconate Cloth  6 each Topical Q0600  . dorzolamide-timolol  1 drop Both Eyes BID  . feeding supplement  237 mL Oral Q24H  . ferrous  sulfate  300 mg Per Tube QODAY  . free water  200 mL Per Tube QID  . furosemide  60 mg Intravenous Q12H  . heparin injection (subcutaneous)  5,000 Units Subcutaneous Q8H  . hydrALAZINE  50 mg Per Tube Q8H  . insulin aspart  0-9 Units Subcutaneous Q4H  . insulin glargine  5 Units Subcutaneous BID  . lactulose  20 g Per Tube BID  . mouth rinse  15 mL Mouth Rinse q12n4p  . nystatin  5 mL Mouth/Throat QID  . pantoprazole sodium  40 mg Per Tube Daily   Continuous Infusions: . feeding supplement (NEPRO CARB STEADY) 1,000 mL (02/04/21 0014)   PRN Meds:.hydrALAZINE, metoprolol  tartrate  Antimicrobials: Anti-infectives (From admission, onward)   Start     Dose/Rate Route Frequency Ordered Stop   01/07/21 0800  ceFEPIme (MAXIPIME) 2 g in sodium chloride 0.9 % 100 mL IVPB  Status:  Discontinued        2 g 200 mL/hr over 30 Minutes Intravenous Daily 01/06/21 1159 01/08/21 1257   01/05/21 1500  vancomycin (VANCOCIN) IVPB 1000 mg/200 mL premix  Status:  Discontinued        1,000 mg 200 mL/hr over 60 Minutes Intravenous Every 24 hours 01/05/21 1357 01/05/21 1414   01/05/21 1500  vancomycin (VANCOCIN) IVPB 1000 mg/200 mL premix  Status:  Discontinued        1,000 mg 200 mL/hr over 60 Minutes Intravenous Every 48 hours 01/05/21 1414 01/06/21 1146   01/05/21 1445  ceFEPIme (MAXIPIME) 2 g in sodium chloride 0.9 % 100 mL IVPB  Status:  Discontinued        2 g 200 mL/hr over 30 Minutes Intravenous Daily 01/05/21 1351 01/06/21 1146   01/03/21 1000  remdesivir 100 mg in sodium chloride 0.9 % 100 mL IVPB  Status:  Discontinued       "Followed by" Linked Group Details   100 mg 200 mL/hr over 30 Minutes Intravenous Daily 01/02/21 2257 01/04/21 1330   01/02/21 2300  remdesivir 200 mg in sodium chloride 0.9% 250 mL IVPB       "Followed by" Linked Group Details   200 mg 580 mL/hr over 30 Minutes Intravenous Once 01/02/21 2257 01/03/21 0147       I have personally reviewed the following labs and images: CBC: Recent Labs  Lab 01/29/21 0422 01/30/21 0413 01/31/21 0359 02/01/21 0127 02/02/21 0437 02/03/21 0354 02/04/21 0418  WBC 2.6* 2.7* 2.6* 2.2* 4.3  --   --   NEUTROABS 1.6* 1.6*  --   --   --   --   --   HGB 8.1* 7.7* 8.3* 8.0* 7.8* 7.5* 8.0*  HCT 25.5* 25.9* 27.5* 26.4* 25.1* 24.5* 26.9*  MCV 90.1 95.9 95.8 95.7 93.7  --   --   PLT 140* 145* 148* 149* 163  --   --    BMP &GFR Recent Labs  Lab 01/31/21 0359 02/01/21 0127 02/02/21 0437 02/03/21 0354 02/04/21 0418  NA 139 138 138 137 136  K 4.3 4.2 4.0 4.0 4.0  CL 107 106 104 104 103  CO2 24 23 24 24 22    GLUCOSE 135* 189* 144* 133* 168*  BUN 61* 66* 73* 78* 80*  CREATININE 2.42* 2.36* 2.57* 2.56* 2.73*  CALCIUM 8.7* 8.5* 8.4* 8.7* 8.9  MG 2.2 2.1 2.1 2.1 2.3  PHOS 4.1 3.9 3.0 3.2 3.8   Estimated Creatinine Clearance: 19.6 mL/min (A) (by C-G formula based on SCr of 2.73 mg/dL (H)).  Liver & Pancreas: Recent Labs  Lab 01/29/21 0422 01/30/21 0413 01/31/21 0359 02/01/21 0127 02/02/21 0437 02/03/21 0354 02/04/21 0418  AST 30  --   --   --   --   --   --   ALT 46*  --   --   --   --   --   --   ALKPHOS 142*  --   --   --   --   --   --   BILITOT 0.5  --   --   --   --   --   --   PROT 6.4*  --   --   --   --   --   --   ALBUMIN 2.2*   < > 2.4* 2.3* 2.4* 2.3* 2.4*   < > = values in this interval not displayed.   No results for input(s): LIPASE, AMYLASE in the last 168 hours. Recent Labs  Lab 02/03/21 2326  AMMONIA 44*   Diabetic: No results for input(s): HGBA1C in the last 72 hours. Recent Labs  Lab 02/04/21 0005 02/04/21 0411 02/04/21 0733 02/04/21 1202 02/04/21 1603  GLUCAP 157* 162* 123* 149* 153*   Cardiac Enzymes: Recent Labs  Lab 02/02/21 0437  CKTOTAL 41   No results for input(s): PROBNP in the last 8760 hours. Coagulation Profile: No results for input(s): INR, PROTIME in the last 168 hours. Thyroid Function Tests: Recent Labs    02/03/21 2326  TSH 3.124   Lipid Profile: No results for input(s): CHOL, HDL, LDLCALC, TRIG, CHOLHDL, LDLDIRECT in the last 72 hours. Anemia Panel: Recent Labs    02/03/21 2326  VITAMINB12 745   Urine analysis:    Component Value Date/Time   COLORURINE YELLOW 01/02/2021 2053   APPEARANCEUR CLEAR 01/02/2021 2053   LABSPEC 1.014 01/02/2021 2053   PHURINE 5.0 01/02/2021 2053   GLUCOSEU 50 (A) 01/02/2021 2053   HGBUR NEGATIVE 01/02/2021 2053   BILIRUBINUR NEGATIVE 01/02/2021 2053   KETONESUR NEGATIVE 01/02/2021 2053   PROTEINUR >=300 (A) 01/02/2021 2053   NITRITE NEGATIVE 01/02/2021 2053   LEUKOCYTESUR NEGATIVE  01/02/2021 2053   Sepsis Labs: Invalid input(s): PROCALCITONIN, [place]  Microbiology: No results found for this or any previous visit (from the past 240 hour(s)).  Radiology Studies: No results found.   Taye T. Tavares  If 7PM-7AM, please contact night-coverage [email or web address] 02/04/2021, 5:33 PM

## 2021-02-04 NOTE — Progress Notes (Signed)
RN at bedside to assist pt with eating ice cream and applesauce. RN had to remind pt several times to swallow foods and checked for pocketing after each bite. Pt ate approximately 5 bites of ice cream and approximately 3-4 bites of applesauce. She did drink 4-5 sips of sweet tea, again, having to remind her to swallow.   Sister, [name], also at the bedside and wishes to become pt's 41 and also has several questions regarding patient's status. RN paged MD Cyndia Skeeters and the Chaplain to come speak with pt and sister.    Pt wishes to go back to sleep. No further needs at this time.

## 2021-02-04 NOTE — TOC Progression Note (Signed)
Transition of Care [facility]) - Progression Note    Patient Details  Name: Alisah Grandberry MRN: [id number] Date of Birth: 04/04/1946  [facility] CM/SW Contact  Ross Ludwig, [place] Phone Number: 02/04/2021, 6:00 PM  Clinical Narrative:     CSW continuing to follow patient's progress throughout discharge planning.   Per SNFs, they can not accept patient if she has a NG tube.  CSW to continue to follow patient's progress throughout discharge planning.    Expected Discharge Plan and Services  Patient to go to a SNF.                                               Social Determinants of Health (SDOH) Interventions    Readmission Risk Interventions Readmission Risk Prevention Plan 01/13/2021  Transportation Screening Complete  PCP or Specialist Appt within 3-5 Days Complete  Social Work Consult for [place] Planning/Counseling Complete  Palliative Care Screening Complete  Medication Review [employer]) Referral to Pharmacy

## 2021-02-04 NOTE — Plan of Care (Signed)
  Problem: Clinical Measurements: Goal: Ability to maintain clinical measurements within normal limits will improve Outcome: Not Progressing Goal: Will remain free from infection Outcome: Progressing Goal: Respiratory complications will improve Outcome: Progressing   Problem: Activity: Goal: Risk for activity intolerance will decrease Outcome: Not Progressing   Problem: Nutrition: Goal: Adequate nutrition will be maintained Outcome: Not Progressing   Problem: Coping: Goal: Level of anxiety will decrease Outcome: Progressing   Problem: Elimination: Goal: Will not experience complications related to bowel motility Outcome: Progressing Goal: Will not experience complications related to urinary retention Outcome: Progressing   Problem: Pain Managment: Goal: General experience of comfort will improve Outcome: Progressing   Problem: Skin Integrity: Goal: Risk for impaired skin integrity will decrease Outcome: Progressing   Problem: Respiratory: Goal: Will maintain a patent airway Outcome: Progressing

## 2021-02-04 NOTE — Progress Notes (Signed)
RN at bedside to assist pt with supper, pt refused to try and eat.

## 2021-02-05 DIAGNOSIS — R4701 Aphasia: Secondary | ICD-10-CM | POA: Diagnosis not present

## 2021-02-05 DIAGNOSIS — N184 Chronic kidney disease, stage 4 (severe): Secondary | ICD-10-CM | POA: Diagnosis not present

## 2021-02-05 DIAGNOSIS — G9341 Metabolic encephalopathy: Secondary | ICD-10-CM | POA: Diagnosis not present

## 2021-02-05 DIAGNOSIS — Z8673 Personal history of transient ischemic attack (TIA), and cerebral infarction without residual deficits: Secondary | ICD-10-CM | POA: Diagnosis not present

## 2021-02-05 DIAGNOSIS — U071 COVID-19: Secondary | ICD-10-CM | POA: Diagnosis not present

## 2021-02-05 LAB — RENAL FUNCTION PANEL
Albumin: 2.4 g/dL — ABNORMAL LOW (ref 3.5–5.0)
Anion gap: 12 (ref 5–15)
BUN: 85 mg/dL — ABNORMAL HIGH (ref 8–23)
CO2: 22 mmol/L (ref 22–32)
Calcium: 8.9 mg/dL (ref 8.9–10.3)
Chloride: 100 mmol/L (ref 98–111)
Creatinine, Ser: 2.66 mg/dL — ABNORMAL HIGH (ref 0.44–1.00)
GFR, Estimated: 18 mL/min — ABNORMAL LOW
Glucose, Bld: 175 mg/dL — ABNORMAL HIGH (ref 70–99)
Phosphorus: 4.1 mg/dL (ref 2.5–4.6)
Potassium: 4 mmol/L (ref 3.5–5.1)
Sodium: 134 mmol/L — ABNORMAL LOW (ref 135–145)

## 2021-02-05 LAB — CBC
HCT: 25.1 % — ABNORMAL LOW (ref 36.0–46.0)
Hemoglobin: 7.6 g/dL — ABNORMAL LOW (ref 12.0–15.0)
MCH: 28.9 pg (ref 26.0–34.0)
MCHC: 30.3 g/dL (ref 30.0–36.0)
MCV: 95.4 fL (ref 80.0–100.0)
Platelets: 140 K/uL — ABNORMAL LOW (ref 150–400)
RBC: 2.63 MIL/uL — ABNORMAL LOW (ref 3.87–5.11)
RDW: 16.1 % — ABNORMAL HIGH (ref 11.5–15.5)
WBC: 3.3 K/uL — ABNORMAL LOW (ref 4.0–10.5)
nRBC: 0 % (ref 0.0–0.2)

## 2021-02-05 LAB — GLUCOSE, CAPILLARY
Glucose-Capillary: 158 mg/dL — ABNORMAL HIGH (ref 70–99)
Glucose-Capillary: 153 mg/dL — ABNORMAL HIGH (ref 70–99)
Glucose-Capillary: 177 mg/dL — ABNORMAL HIGH (ref 70–99)
Glucose-Capillary: 150 mg/dL — ABNORMAL HIGH (ref 70–99)
Glucose-Capillary: 163 mg/dL — ABNORMAL HIGH (ref 70–99)

## 2021-02-05 LAB — MAGNESIUM: Magnesium: 2.4 mg/dL (ref 1.7–2.4)

## 2021-02-05 MED ORDER — NEPRO/CARBSTEADY PO LIQD
1000.0000 mL | ORAL | Status: DC
  Administered 2021-02-06 – 2021-02-17 (×9): 1000 mL
  Filled 2021-02-05 (×21): qty 1000

## 2021-02-05 NOTE — Progress Notes (Addendum)
Chaplain follow up for continued support around AD.     Pt expressed awareness of self, place, time.  In response to open questions regarding whom she would like to make medical decisions, she expressed "[name]"    This is consistent with her response yesterday.     Pt again answered these questions in front of witness and notary.     ADVANCE DIRECTIVE (HCPOA) COMPLETED.   Document notarized.  Pt and sister with original and copies.  Copy placed in chart.  Copy emailed to ACP for placement in [facility]

## 2021-02-05 NOTE — Progress Notes (Signed)
RN at bedside to assist pt with eating breakfast. Pt refused to attempt to eat anything at this time. RN will continue trying throughout the day.

## 2021-02-05 NOTE — Progress Notes (Signed)
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  MRI brain negative for acute finding.  Hospital course complicated by AKI and dysphagia.  Nephrology did not feel patient is a candidate for HD and recommended DNR and possible hospice. She pulled out NGT twice but replaced for the third time.  SLP upgraded to dysphagia 1 diet.  Dietitian adjusted tube feeds to 18 hours regimen and encourage p.o. intake with the hope to avoid PEG tube but patient's mental status remain poor still dietitian has adjusted tube feedings to 24-hour feeds.  She also have progressive renal failure with azotemia and possible uremia but Cr has stabilized. Reportedly separated from spouse about a year ago. Sister trying to get HCPOA done here and this was done today with Chaplain as patient was aware of self, place and time and designated her Sister.  Will need to discuss further goals of care with the patient and the patient's sister given her continued poor p.o. intake   Assessment & Plan:   Principal Problem:   Acute metabolic encephalopathy Active Problems:   Insulin dependent type 2 diabetes mellitus ([place])   Hypertension   History of stroke   CKD (chronic kidney disease), stage IV ([place])   COVID-19 virus infection   Hypernatremia  Acute metabolic encephalopathy/delirium in patient with history of dementia-per sister, able to walk with walker and converse at baseline.  She now have significant azotemia concerning for uremia.  TSH, B12, EEG, CT head and MRI brain negative for acute finding. Neurology recommended treating treatable causes.  -Reorientation and delirium precautions. -Repeat ammonia slightly elevated.  Start lactulose 20 g twice  daily given Ammonia Level of 44 -Check RPR and non-reactive -Continue to Monitor closely   Oropharyngeal dysphagia-removed NGT x2, and replaced for the third time -Upgraded to dysphagia 1 diet but not able to take p.o. due to deteriorating mental status. -Resume continuous tube feed again given that her po Intake remains poor -I am not sure if PEG tube can reverse the course.  Dr. Cyndia Skeeters discussed this with patient's sister over the phone yesterday -Palliative Care involved closely   Nonoliguric AKI on CKD-4: Progressive azotemia and possible uremia. Recent Labs (within last 365 days)              Recent Labs    01/24/21 0420 01/25/21 0429 01/27/21 0845 01/29/21 0422 01/30/21 0413 01/31/21 0359 02/01/21 0127 02/02/21 0437 02/03/21 0354 02/04/21 0418  BUN 60* 64* 63* 58* 61* 61* 66* 73* 78* 80*  CREATININE 2.63* 2.59* 2.46* 2.35* 2.38* 2.42* 2.36* 2.57* 2.56* 2.73*    -Renal ultrasound without acute finding. -Not a candidate for HD per nephrology -IV Lasix 60 mg twice daily and Dr. Cyndia Skeeters discussed risk and benefit with patient's sister at bedside yesterday   Pancytopenia: Leukopenia and thrombocytopenia had resolved but now back. FOBT negative. Anemia panel consistent with ACD.  Due to malnutrition? -Continue with ferrous sulfate 300 mg per tube every other day -Patient's WBC is 3.3, hemoglobin/hematocrit 10.6/25.1, and platelet count is 140 -Continue monitoring  Anasarca secondary to hypoalbuminemia Acute right upper extremity swelling: RUE negative for DVT.  CT suggests anasarca. -IV Lasix as above and getting IV 60 mg q12h -Optimize nutrition and nutritionist to change  to tube feedings continuously now  Hypothermia: Unclear cause of this but resolved.   COVID-19 viral infection: Tested positive on 01/02/2021.  Received remdesivir x3 doses. -No indication for airborne isolation  Hypernatremia/Hyperkalemia: Resolved and is now slightly hypokalemic  Essential  HTN: SBP elevated. -Continue carvedilol 6.25 mg twice daily per tube. and hydralazine 50 mg per tube every 8 -As needed hydralazine with parameters.  Chronic Diastolic CHF:  -No cardiopulmonary symptoms but anasarca. -IV Lasix as above -Monitor fluid and respiratory status closely.  History of CVA in 09/2020 with residual left hemiparesis and expressive aphasia:  -CT head and MRI brain without acute finding. -Continue Plavix, Lipitor via tube -PT has recommended SNF  Acute urinary retention -Resolved. -Monitor urinary output.  Goals of care: Multiple comorbidities as above.  Continued to have Progressive decline.  Poor prognosis.  However, still full code which will pose more harm than benefit.  DNR/DNI recommended by multiple experts including palliative medicine in the past but patient's sister, [name] insisted on full code and full scope of care. My colleague had discussed patient's progressive decline with Ms. cheek at bedside.  [name] is trying to obtain [facility] POA and this has now been done.  [name] states Ms. Langille and Mr.Weller were "separated" about a year ago when she went into nursing home.  Ms. Pasley lived with Ms. Cheek when she was out of nursing home since then.  Dr. Cyndia Skeeters attempted to call Mr. Seyller since he is still listed as spouse in our system but  went to voicemail. Palliative Care reconsulted for [place] [facility] and Notified Ethics team about current situation.  He continued to currently recommend the patient's CODE STATUS be changed to DNR given that her work measures would be somewhat futile given the patient's state of health and current comorbidities.  The patient's mental status continues to wax and wane and she is expressed to the medical team that she would not want tube feedings and is tired however she continues to have very poor p.o. intake.  Currently the situation is very difficult and despite the current findings artificial feeding/PEG tube will not provide any  meaningful recovery she may end up with a PEG tube regardless now that her sister is healthcare power of attorney  Overweight Body mass index is 29.21 kg/m. Nutrition Problem: Increased nutrient needs Etiology: acute illness (COVID-19 infection) Signs/Symptoms: estimated needs Interventions: Magic cup,Ensure Enlive (each supplement provides 350kcal and 20 grams of protein),Tube feeding  Pressure Injury Pressure Injury Sacrum Mid red open skin (Active)     Location: Sacrum  Location Orientation: Mid  Staging:   Wound Description (Comments): red open skin  Present on Admission: Yes   DVT prophylaxis: Heparin 5,000 units sq q8h Code Status: FULL CODE  Family Communication: No family present at bedside  Disposition Plan: SNF placement if her oral intake picks up and her tube feedings via he Cortrak have  been removed; May need LTAC   Status is: Inpatient  Remains inpatient appropriate because:Unsafe d/c plan, IV treatments appropriate due to intensity of illness or inability to take PO and Inpatient level of care appropriate due to severity of illness   Dispo:  Patient From: [place]  Planned Disposition: [place]  Expected discharge date: 02/08/2021  Medically stable for discharge:       Consultants:   Nephrology  Neurology  Palliative Care Medicine  Discussed with Ethics Team Dr. Emeterio Reeve    Procedures: NG Placement   Antimicrobials:  Anti-infectives (From admission, onward)   Start     Dose/Rate Route Frequency Ordered Stop   01/07/21 0800  ceFEPIme (MAXIPIME) 2 g in sodium chloride 0.9 % 100 mL IVPB  Status:  Discontinued        2 g 200 mL/hr over 30 Minutes Intravenous Daily 01/06/21 1159 01/08/21 1257   01/05/21 1500  vancomycin (VANCOCIN) IVPB 1000 mg/200 mL premix  Status:  Discontinued        1,000 mg 200 mL/hr over 60 Minutes Intravenous Every 24 hours 01/05/21 1357 01/05/21 1414   01/05/21 1500  vancomycin  (VANCOCIN) IVPB 1000 mg/200 mL premix  Status:  Discontinued        1,000 mg 200 mL/hr over 60 Minutes Intravenous Every 48 hours 01/05/21 1414 01/06/21 1146   01/05/21 1445  ceFEPIme (MAXIPIME) 2 g in sodium chloride 0.9 % 100 mL IVPB  Status:  Discontinued        2 g 200 mL/hr over 30 Minutes Intravenous Daily 01/05/21 1351 01/06/21 1146   01/03/21 1000  remdesivir 100 mg in sodium chloride 0.9 % 100 mL IVPB  Status:  Discontinued       "Followed by" Linked Group Details   100 mg 200 mL/hr over 30 Minutes Intravenous Daily 01/02/21 2257 01/04/21 1330   01/02/21 2300  remdesivir 200 mg in sodium chloride 0.9% 250 mL IVPB       "Followed by" Linked Group Details   200 mg 580 mL/hr over 30 Minutes Intravenous Once 01/02/21 2257 01/03/21 0147        Subjective: Seen and examined at bedside was still a little confused but was awake.  No nausea or vomiting.  Continues to have very poor p.o. intake is minimally taking food and has been prompted significantly to even swallow.  Palliative care reevaluated.  Objective: Vitals:   02/04/21 1940 02/04/21 2110 02/05/21 0600 02/05/21 1305  BP:  (!) 145/58  (!) 145/53  Pulse:    64  Resp:    15  Temp:  97.6 F (36.4 C)  (!) 97 F (36.1 C)  TempSrc:  Axillary  Tympanic  SpO2: 97%   100%  Weight:   85.5 kg   Height:        Intake/Output Summary (Last 24 hours) at 02/05/2021 1750 Last data filed at 02/05/2021 1300 Gross per 24 hour  Intake 1100 ml  Output 1800 ml  Net -700 ml   Filed Weights   02/03/21 0500 02/04/21 0454 02/05/21 0600  Weight: 84.9 kg 84.6 kg 85.5 kg   Examination: Physical Exam:  Constitutional: Overweight African-American female who is chronically ill-appearing with a Cortrak in place Eyes: Lids and conjunctivae normal, sclerae anicteric  ENMT: External Ears, Nose appear normal. Grossly normal hearing; Cotrak in place Neck: Appears normal, supple, no cervical masses, normal ROM, no appreciable thyromegaly,: No  JVD Respiratory: Diminished to auscultation bilaterally, no wheezing, rales, rhonchi or crackles. Normal respiratory effort and patient is not tachypenic. No accessory muscle use.  Unlabored breathing Cardiovascular: RRR, no murmurs / rubs / gallops. S1 and S2 auscultated.  1+ lower extremity edema Abdomen: Soft, non-tender, distended secondary body habitus. Bowel sounds positive.  GU: Deferred. Musculoskeletal: No clubbing / cyanosis of digits/nails. No joint deformity upper and lower extremities.  Skin: No rashes, lesions, ulcers on limited skin evaluation. No induration; Warm and dry.  Neurologic: CN 2-12 grossly intact with no focal deficits. Romberg sign and cerebellar reflexes not assessed.  Psychiatric: Impaired judgment and insight. Alert and  oriented x 2. Normal mood and appropriate affect.   Data Reviewed: I have personally reviewed following labs and imaging studies  CBC: Recent Labs  Lab 01/30/21 0413 01/31/21 0359 02/01/21 0127 02/02/21 0437 02/03/21 0354 02/04/21 0418 02/05/21 0426  WBC 2.7* 2.6* 2.2* 4.3  --   --  3.3*  NEUTROABS 1.6*  --   --   --   --   --   --   HGB 7.7* 8.3* 8.0* 7.8* 7.5* 8.0* 7.6*  HCT 25.9* 27.5* 26.4* 25.1* 24.5* 26.9* 25.1*  MCV 95.9 95.8 95.7 93.7  --   --  95.4  PLT 145* 148* 149* 163  --   --  825*   Basic Metabolic Panel: Recent Labs  Lab 02/01/21 0127 02/02/21 0437 02/03/21 0354 02/04/21 0418 02/05/21 0426  NA 138 138 137 136 134*  K 4.2 4.0 4.0 4.0 4.0  CL 106 104 104 103 100  CO2 23 24 24 22 22   GLUCOSE 189* 144* 133* 168* 175*  BUN 66* 73* 78* 80* 85*  CREATININE 2.36* 2.57* 2.56* 2.73* 2.66*  CALCIUM 8.5* 8.4* 8.7* 8.9 8.9  MG 2.1 2.1 2.1 2.3 2.4  PHOS 3.9 3.0 3.2 3.8 4.1   GFR: Estimated Creatinine Clearance: 20.2 mL/min (A) (by C-G formula based on SCr of 2.66 mg/dL (H)). Liver Function Tests: Recent Labs  Lab 02/01/21 0127 02/02/21 0437 02/03/21 0354 02/04/21 0418 02/05/21 0426  ALBUMIN 2.3* 2.4* 2.3* 2.4*  2.4*   No results for input(s): LIPASE, AMYLASE in the last 168 hours. Recent Labs  Lab 02/03/21 2326  AMMONIA 44*   Coagulation Profile: No results for input(s): INR, PROTIME in the last 168 hours. Cardiac Enzymes: Recent Labs  Lab 02/02/21 0437  CKTOTAL 41   BNP (last 3 results) No results for input(s): PROBNP in the last 8760 hours. HbA1C: No results for input(s): HGBA1C in the last 72 hours. CBG: Recent Labs  Lab 02/04/21 2319 02/05/21 0352 02/05/21 0748 02/05/21 1156 02/05/21 1705  GLUCAP 148* 158* 153* 177* 150*   Lipid Profile: No results for input(s): CHOL, HDL, LDLCALC, TRIG, CHOLHDL, LDLDIRECT in the last 72 hours. Thyroid Function Tests: Recent Labs    02/03/21 2326  TSH 3.124   Anemia Panel: Recent Labs    02/03/21 2326  VITAMINB12 745   Sepsis Labs: No results for input(s): PROCALCITON, LATICACIDVEN in the last 168 hours.  No results found for this or any previous visit (from the past 240 hour(s)).   RN Pressure Injury Documentation: Pressure Injury Sacrum Mid red open skin (Active)     Location: Sacrum  Location Orientation: Mid  Staging:   Wound Description (Comments): red open skin  Present on Admission: Yes     Estimated body mass index is 29.52 kg/m as calculated from the following:   Height as of this encounter: 5\' 7"  (1.702 m).   Weight as of this encounter: 85.5 kg.  Malnutrition Type:  Nutrition Problem: Increased nutrient needs Etiology: acute illness (COVID-19 infection)   Malnutrition Characteristics:  Signs/Symptoms: estimated needs   Nutrition Interventions:  Interventions: Magic cup,Ensure Enlive (each supplement provides 350kcal and 20 grams of protein),Tube feeding   Radiology Studies: No results found.  Scheduled Meds: . atorvastatin  80 mg Per Tube Daily  . carvedilol  6.25 mg Per NG tube BID WC  . chlorhexidine  15 mL Mouth Rinse BID  . Chlorhexidine Gluconate Cloth  6 each Topical Q0600  .  dorzolamide-timolol  1 drop Both Eyes BID  .  ferrous sulfate  300 mg Per Tube QODAY  . free water  200 mL Per Tube QID  . furosemide  60 mg Intravenous Q12H  . heparin injection (subcutaneous)  5,000 Units Subcutaneous Q8H  . hydrALAZINE  50 mg Per Tube Q8H  . insulin aspart  0-9 Units Subcutaneous Q4H  . insulin glargine  5 Units Subcutaneous BID  . lactulose  20 g Per Tube BID  . mouth rinse  15 mL Mouth Rinse q12n4p  . nystatin  5 mL Mouth/Throat QID  . pantoprazole sodium  40 mg Per Tube Daily   Continuous Infusions: . feeding supplement (NEPRO CARB STEADY) 50 mL/hr at 02/05/21 1146    LOS: 39 days   Kerney Elbe, DO [facility] PAGER is on [place]  If 7PM-7AM, please contact night-coverage [email or web address]

## 2021-02-05 NOTE — Progress Notes (Signed)
Nutrition Follow-up  INTERVENTION:   Adjust TF to 24 hour feeds given continued poor PO intakes: -Change Nepro to goal rate of 50 ml/hr x 24 hours via small bore NGT. -Provides 2124 kcals, 97g protein and 872 ml H2O.  -Free water flushes per MD: currently 200 ml QID (800 ml daily)  -D/c Ensure   NUTRITION DIAGNOSIS:   Increased nutrient needs related to acute illness (COVID-19 infection) as evidenced by estimated needs.  Ongoing.  GOAL:   Patient will meet greater than or equal to 90% of their needs  Meeting with TF.  MONITOR:   PO intake,Supplement acceptance,TF tolerance,Labs,Weight trends  ASSESSMENT:   75 year old female with past medical history significant for previous stroke with residual left-sided weakness 10/21, HTN, breast cancer 2017, expressive aphasia, dementia, CKD stage IV and diabetes who presents from her nursing facility due to altered mental status and was found to have hypernatremia as well as COVID-19.  Significant Events:  1/21- admission 1/27- TF initiation 2/9- TF formula changed from Osmolite 1.5 to Nepro 2/17- diet advanced from NPO to Dysphasia 1, thin liquids at noon  RN paged RD at request of MD given pt's continued poor POs. Per RN, pt consumed 5 tiny bites of Magic cup and some sips of sweet tea. RN also concerned about possible aspiration. Will switch to 24 hour feeds at this time. Can be transitioned to 18 hour feeds again if pt shows improvement. Per RN, pt's daughter still deciding about PEG tube.  Admission weight: 172 lbs Current weight: 188 lbs.  Medications: Ferrous sulfate, Lasix, Lactulose  Labs reviewed: CBGs: 148-158 Low Na  Diet Order:   Diet Order            DIET - DYS 1 Room service appropriate? Yes; Fluid consistency: Thin  Diet effective now                 EDUCATION NEEDS:   Not appropriate for education at this time  Skin:  Skin Assessment: Reviewed RN Assessment  Last BM:  2/22 -type 6  Height:    Ht Readings from Last 1 Encounters:  01/03/21 5\' 7"  (1.702 m)    Weight:   Wt Readings from Last 1 Encounters:  02/05/21 85.5 kg   BMI:  Body mass index is 29.52 kg/m.  Estimated Nutritional Needs:   Kcal:  1800-2100kcal/day  Protein:  90-105g/day  Fluid:  1.6-1.8L/day  Clayton Bibles, MS, RD, LDN Inpatient Clinical Dietitian Contact information available via Amion

## 2021-02-05 NOTE — Progress Notes (Signed)
RN updated dietician about pt refusing to eat or only eating 4-5 tiny bites of ice cream for each meal. Dietician to follow up. RN will continue to monitor.

## 2021-02-05 NOTE — Progress Notes (Signed)
Dr Alfredia Ferguson and I spoke re: ethics consultation  I'm working remotely will add more detailed note when able   Re: surrogate decision maker If legal spouse is unavailable / declines involvement, and if patient has no children or parents (or no reasonably available/involved children or parents) medical team is ethically justified in working with legal next of kin to guide decisions. At this point sounds like sister. [place] statut e specifically outlines hierarchy here - please reference this.   Re: futility of treatments If attending physician and another physician consultant agree that a given treatment is medically futile, the attending physician is ethically justified in not offering that treatment/procedure. Shared decision making with next of kin is desirable but if this is not possible the medical team is ethically justified in acting unilaterally . Family may seek transfer. [facility] Futility Policy specifically outlines this process - please reference this document.   Dr Alfredia Ferguson has my personal cell phone and has my permission to share this at his discretion   Thank you for involving ethics. Will continue to follow!

## 2021-02-05 NOTE — Progress Notes (Signed)
Speech Language Pathology Treatment: Dysphagia  Patient Details Name: Desiree Pollard MRN: [id number] DOB: 07/10/1946 Today's Date: 02/05/2021 Time: 1101-1130 SLP Time Calculation (min) (ACUTE ONLY): 29 min  Assessment / Plan / Recommendation Clinical Impression  Pt seen today to assess tolerance of po diet and readiness/indication for dietary advancement.  Pt asleep upon SLP arrival to room and needed moderate verbal/tactile cues to awaken adequately for po intake.  She initially declined to consume any intake but when offered to obtain a "honey bun" for her.  She consumed only approximately 3 bites of honey bun requiring ice cream to help orally transit and water.  Swallow delays continued to vary from 9 seconds to 17 seconds with use of ice cream to aid transiting. Anterior loss of water noted due to decreased labial seal along with multiple swallows.  Pt then declined to consume all other po offered.  Pt reports she does not want to eat regardless and does not want a PEG.  Given pt has been on a diet since her MBS on 2/16 and is only consuming small bites/sips - recommend consider to re-engage palliative.  Pt is also advising that she is "tired".    Given pt's advanced age of 75, h/o dementia, etc, hopeful that family understands that PEG tubes do not change morbidity or mortality with people over the age of 79 with cognitive deficits.    Recommend to consider addressing burden vs benefit of longer term feeding tube with this pt and pt's family.  As she remains elevated aspiration risk due to continued delay with swallow and malnutrition risk due to poor intake.         HPI HPI: Pt is a 75 yo female adm to [facility] with acute metabolic encephalopathy - COVID +, has h/o CVAs x2 - last October 2021, DM, CKD4, dementia, breast cancer.  Imaging of brain previously has shown Stable appearance of right frontal encephalomalacia and bilateral  lacunar type infarcts in the basal ganglia and cerebellar   hemispheres. . Expected evolution of encephalomalacia along the right  inferior cerebellum corresponding with a presumed subacute infarct  on comparison CT.  3. Chronic microvascular angiopathy and parenchymal volume loss.  CVAs have left pt with aphasia and left weakness per Admit note.  Most recent CXR concerning for potential infectious process.  Swallow eval ordered and pt has not been able to consume po intake due to dysphagia and mentation. She has pulled out 2 Dobbhoff tubes in the past 24 to 48 hours (2/11-2/13). MD requesting SLE to determine if patient is cognitively more ready for PO's, due to patient with reported improved alertness, speech clarity.  Pt has not been consuming po intake - per RN both due to AMS and lack of appetite/desire.  Given pt had her MBS 2/16 and intake has not improved, she is not meeting nutritional needs.  Per chart review, ongoing discussions taking place re: plan of care.      SLP Plan  Continue with current plan of care       Recommendations  Diet recommendations: Dysphagia 1 (puree);Thin liquid Liquids provided via: Cup;Straw Medication Administration: Via alternative means Supervision: Staff to assist with self feeding;Full supervision/cueing for compensatory strategies Compensations: Slow rate;Small sips/bites;Other (Comment) (have pt help self feed) Postural Changes and/or Swallow Maneuvers: Seated upright 90 degrees;Upright 30-60 min after meal                Oral Care Recommendations: Oral care QID Follow up Recommendations: 24 hour supervision/assistance SLP  Visit Diagnosis: Dysphagia, oral phase (R13.11) Plan: Continue with current plan of care       [place], Joandy Burget Ann 02/05/2021, 1:09 PM  Kathleen Lime, MS [facility] SLP Acute Rehab Services Office [phone] Pager [phone]

## 2021-02-05 NOTE — Progress Notes (Signed)
Daily Progress Note   Patient Name: Desiree Pollard       Date: 02/05/2021 DOB: 01/06/1946  Age: 75 y.o. MRN#: [id number] Attending Physician: Kerney Elbe, DO Primary Care Physician: [name], FNP Admit Date: 01/02/2021  Reason for Consultation/Follow-up: Establishing goals of care  Subjective: Chart Reviewed. Updates Received.   Patient's prognosis remains poor. Poor oral nutrition and lethargic. A&O x2.   I spoke at length with sister, Desiree Pollard. Family is remaining hopeful for improvement. Sister shares she would like to allow every opportunity to show some improvement.   I created space and opportunity to discuss patient's quality of life. I acknowledged to sister that patient has expressed wishes to be tired and would not want any artificial feedings. She verbalized understanding but also expresses she has not said these things to her. I attempted to explain sometimes patients spare feelings of their loved ones and tend to be more open to expressing the feelings to the medical team.   We discussed at length her full code status and concerns for decline and high risk of sudden death or further decline. Desiree Pollard states she is not in a position to make any decisions at this time and again states wishes for patient to remain a full code.   Sister shares frustrations in obtaining HCPOA documents. I explained patient has not been in a position to complete appropriate documentation. She verbalized understanding expressing she has a meeting wit the Chaplain and would like to see for herself that patient is not in a position to complete documents. She shares concerns that patient and her husband has been separated for a year and he has been estranged due to his aggressiveness. Desiree Pollard shares she and her sister reached out to him, most recently this morning. She states husband is declining to have any interactions or involvement in patient's care. Education provided to sister if  husband wished to relinquish his decision making he could also do this allowing her to make decisions.   Given family dynamics and to allow due dilligence of reaching Mr. Kirstein (given he and patient are still legally married) I attempted to reach out to number listed. Call was sent to voicemail and message has been left with contact information.    Length of Stay: 33 days  Vital Signs: BP (!) 145/53 (BP Location: Left Arm)   Pulse 64   Temp (!) 97 F (36.1 C) (Tympanic)   Resp 15   Ht 5\' 7"  (1.702 m)   Wt 85.5 kg   SpO2 100%   BMI 29.52 kg/m  SpO2: SpO2: 100 % O2 Device: O2 Device: Room Air O2 Flow Rate:           Palliative Care Assessment & Plan   Goals of Care/Recommendations:  Sister reports meeting with Chaplain to seek completion of HCPOA if patient is eligible to complete.   Sister, Desiree Pollard requesting continued aggressive interventions. Not interested in making any further decisions or engage in any further Palliative focused discussions at this time. She is requesting full code despite advisement againstu and heroic measures would be somewhat futile given patient's current state of health and co-morbidities.   Artificial feedings/PEG will not provided any meaningful recovery for patient. Patient has expressed to medical team members she would not want feeding tube and is tired.   Patient and husband is legally married, as such he is the current medical decision maker in the absence of advanced directives. He has not been involved in patient's care  and estranged over the past year. I have called and left VM with contact information to allow for due diligence. If husband does not respond to request ethically and if medical team agrees it will be most appropriate for next of kin (siblings) to serve as surrogate decision makers.   Sister is not interested in continued Palliative discussions at this time Palliative will follow distantly for need to appropriate re-engage.  Please  call team line for urgent needs.    Prognosis: POOR  Discharge Planning: To Be Determined  Thank you for allowing the Palliative Medicine Team to assist in the care of this patient.  Time Total: 45 min.   Visit consisted of counseling and education dealing with the complex and emotionally intense issues of symptom management and palliative care in the setting of serious and potentially life-threatening illness.Greater than 50%  of this time was spent counseling and coordinating care related to the above assessment and plan.  Alda Lea, AGPCNP-BC  Palliative Medicine Team [phone]

## 2021-02-06 ENCOUNTER — Inpatient Hospital Stay (HOSPITAL_COMMUNITY): Payer: Medicare HMO

## 2021-02-06 DIAGNOSIS — N184 Chronic kidney disease, stage 4 (severe): Secondary | ICD-10-CM | POA: Diagnosis not present

## 2021-02-06 DIAGNOSIS — R4701 Aphasia: Secondary | ICD-10-CM | POA: Diagnosis not present

## 2021-02-06 DIAGNOSIS — G9341 Metabolic encephalopathy: Secondary | ICD-10-CM | POA: Diagnosis not present

## 2021-02-06 DIAGNOSIS — U071 COVID-19: Secondary | ICD-10-CM | POA: Diagnosis not present

## 2021-02-06 LAB — CBC WITH DIFFERENTIAL/PLATELET
Abs Immature Granulocytes: 0.15 10*3/uL — ABNORMAL HIGH (ref 0.00–0.07)
Basophils Absolute: 0 10*3/uL (ref 0.0–0.1)
Basophils Relative: 1 %
Eosinophils Absolute: 0.1 10*3/uL (ref 0.0–0.5)
Eosinophils Relative: 3 %
HCT: 24 % — ABNORMAL LOW (ref 36.0–46.0)
Hemoglobin: 7.4 g/dL — ABNORMAL LOW (ref 12.0–15.0)
Immature Granulocytes: 4 %
Lymphocytes Relative: 17 %
Lymphs Abs: 0.6 10*3/uL — ABNORMAL LOW (ref 0.7–4.0)
MCH: 29.2 pg (ref 26.0–34.0)
MCHC: 30.8 g/dL (ref 30.0–36.0)
MCV: 94.9 fL (ref 80.0–100.0)
Monocytes Absolute: 0.4 10*3/uL (ref 0.1–1.0)
Monocytes Relative: 12 %
Neutro Abs: 2.2 10*3/uL (ref 1.7–7.7)
Neutrophils Relative %: 63 %
Platelets: 130 10*3/uL — ABNORMAL LOW (ref 150–400)
RBC: 2.53 MIL/uL — ABNORMAL LOW (ref 3.87–5.11)
RDW: 16.1 % — ABNORMAL HIGH (ref 11.5–15.5)
WBC: 3.5 10*3/uL — ABNORMAL LOW (ref 4.0–10.5)
nRBC: 0 % (ref 0.0–0.2)

## 2021-02-06 LAB — GLUCOSE, CAPILLARY
Glucose-Capillary: 122 mg/dL — ABNORMAL HIGH (ref 70–99)
Glucose-Capillary: 123 mg/dL — ABNORMAL HIGH (ref 70–99)
Glucose-Capillary: 131 mg/dL — ABNORMAL HIGH (ref 70–99)
Glucose-Capillary: 139 mg/dL — ABNORMAL HIGH (ref 70–99)
Glucose-Capillary: 155 mg/dL — ABNORMAL HIGH (ref 70–99)
Glucose-Capillary: 181 mg/dL — ABNORMAL HIGH (ref 70–99)

## 2021-02-06 LAB — COMPREHENSIVE METABOLIC PANEL
ALT: 69 U/L — ABNORMAL HIGH (ref 0–44)
AST: 48 U/L — ABNORMAL HIGH (ref 15–41)
Albumin: 2.4 g/dL — ABNORMAL LOW (ref 3.5–5.0)
Alkaline Phosphatase: 213 U/L — ABNORMAL HIGH (ref 38–126)
Anion gap: 11 (ref 5–15)
BUN: 90 mg/dL — ABNORMAL HIGH (ref 8–23)
CO2: 24 mmol/L (ref 22–32)
Calcium: 8.9 mg/dL (ref 8.9–10.3)
Chloride: 100 mmol/L (ref 98–111)
Creatinine, Ser: 2.77 mg/dL — ABNORMAL HIGH (ref 0.44–1.00)
GFR, Estimated: 17 mL/min — ABNORMAL LOW (ref >60)
Glucose, Bld: 132 mg/dL — ABNORMAL HIGH (ref 70–99)
Potassium: 4 mmol/L (ref 3.5–5.1)
Sodium: 135 mmol/L (ref 135–145)
Total Bilirubin: 0.8 mg/dL (ref 0.3–1.2)
Total Protein: 6.5 g/dL (ref 6.5–8.1)

## 2021-02-06 LAB — MAGNESIUM: Magnesium: 2.2 mg/dL (ref 1.7–2.4)

## 2021-02-06 LAB — PHOSPHORUS: Phosphorus: 4.1 mg/dL (ref 2.5–4.6)

## 2021-02-06 IMAGING — CT CT ABDOMEN W/O CM
2 of 4 series · 15 of 46 positions shown, 17 images · non-contrast
Comparison: [DATE] abdominal radiograph. [DATE] CT
abdomen/pelvis.

CLINICAL DATA: Inpatient. Assess anatomy for possible gastrostomy
tube placement. Chronic kidney disease.

EXAM:
CT ABDOMEN WITHOUT CONTRAST
TECHNIQUE: Multidetector CT imaging of the abdomen was performed following the
standard protocol without IV contrast.

[Series 2: axial st · axial · 0.98mm/px · z∈[+1090,+1310]mm · 12 of 50 slices shown, 14 images]
[im 3/50  soft-tissue]
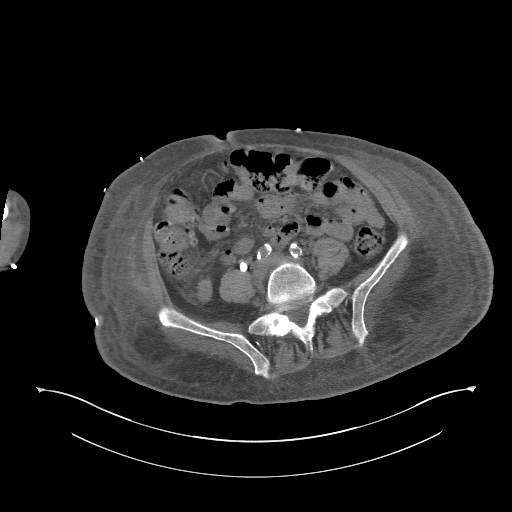
[im 3/50  bone]
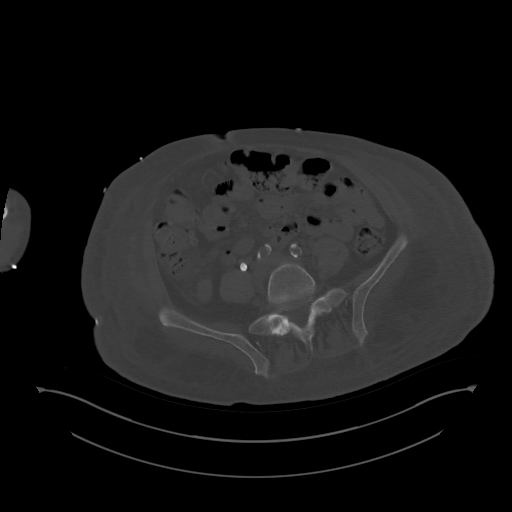
[im 9/50  soft-tissue]
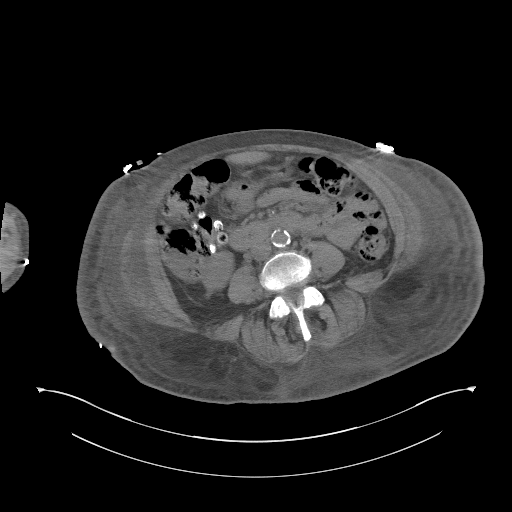
[im 12/50  soft-tissue]
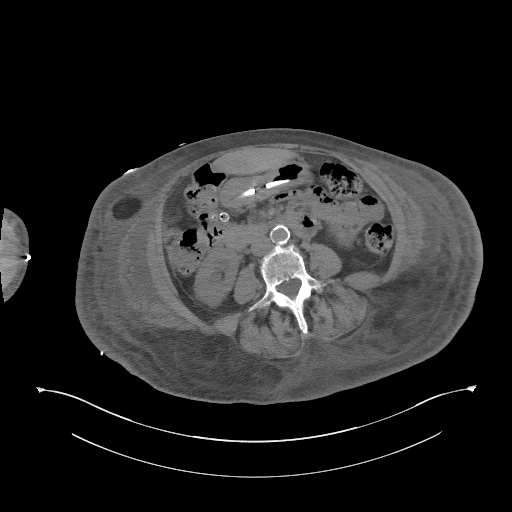
[im 15/50  soft-tissue]
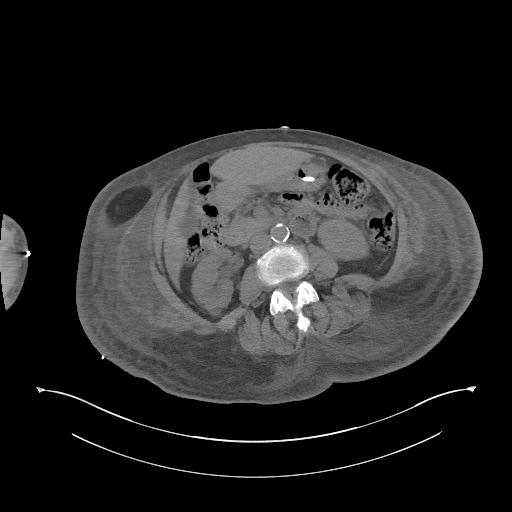
[im 21/50  soft-tissue]
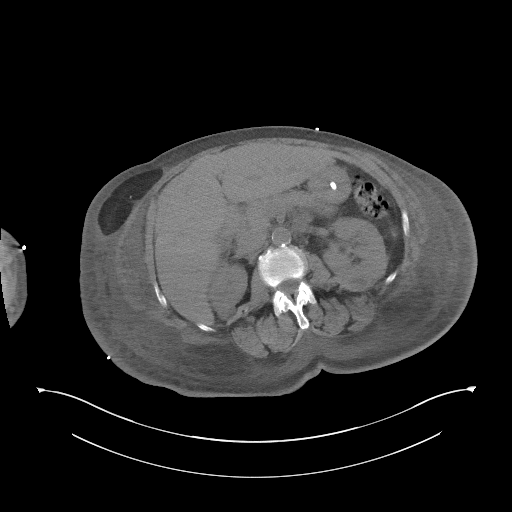
[im 24/50  soft-tissue]
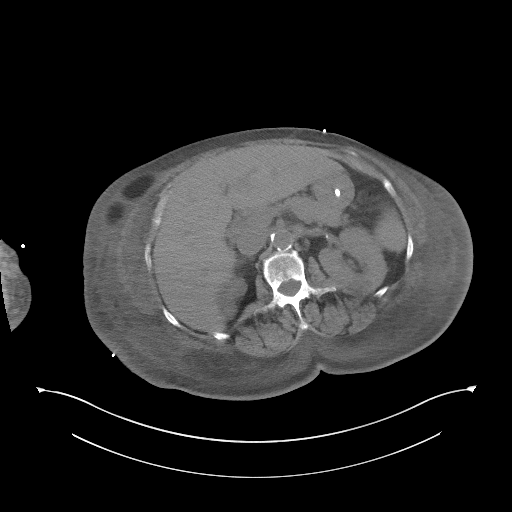
[im 26/50  soft-tissue]
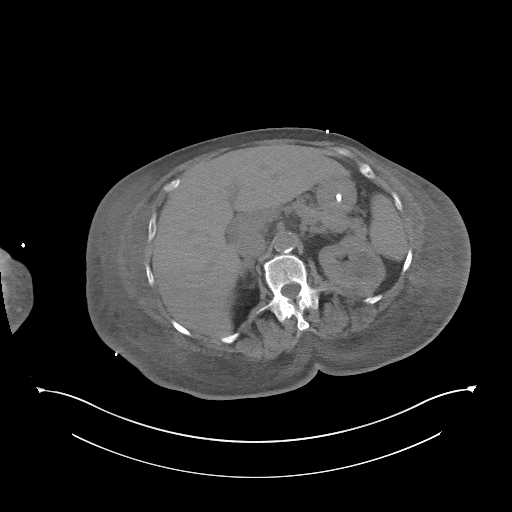
[im 32/50  soft-tissue]
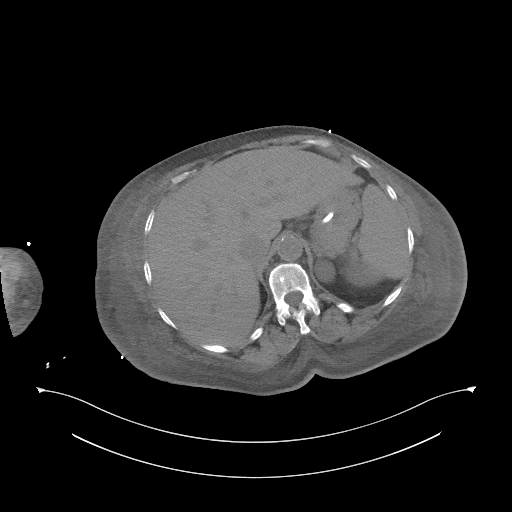
[im 35/50  soft-tissue]
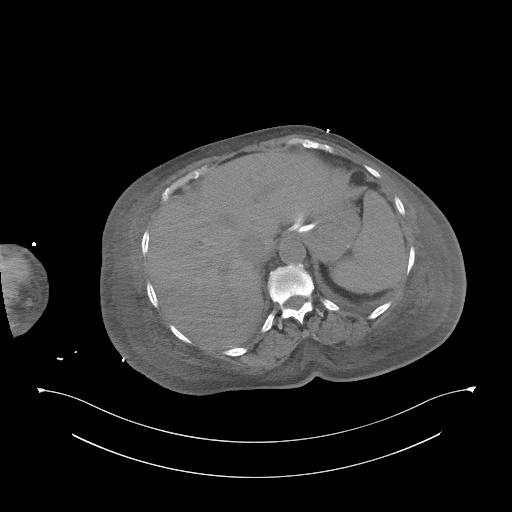
[im 35/50  bone]
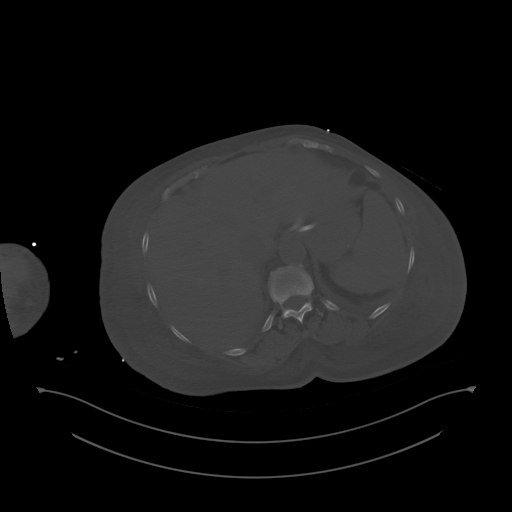
[im 38/50  soft-tissue]
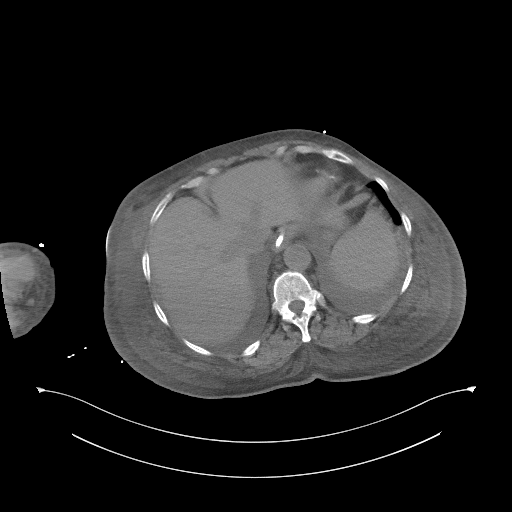
[im 44/50  soft-tissue]
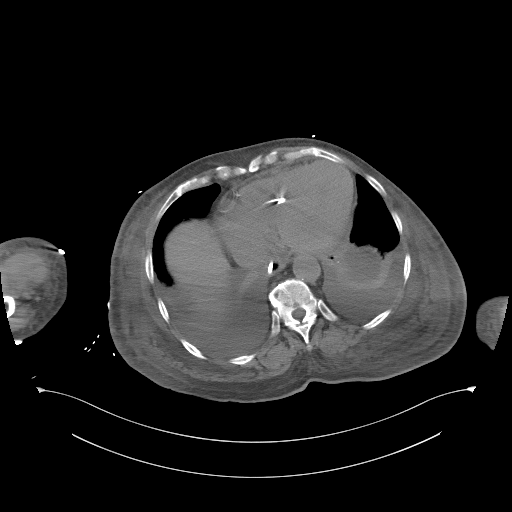
[im 47/50  soft-tissue]
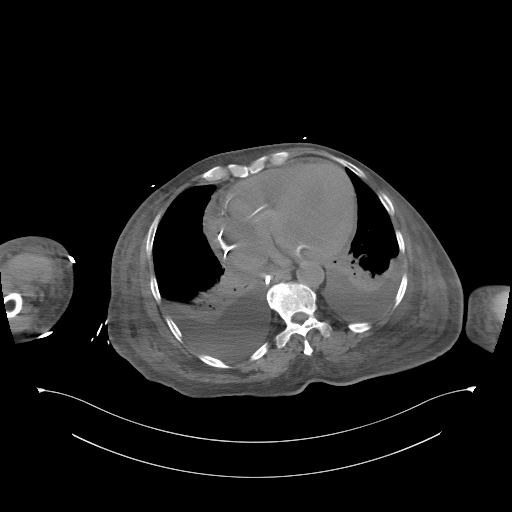

[Series 4: coronal st · coronal · 0.48mm/px · 3 of 90 slices shown]
[im 30/90  soft-tissue]
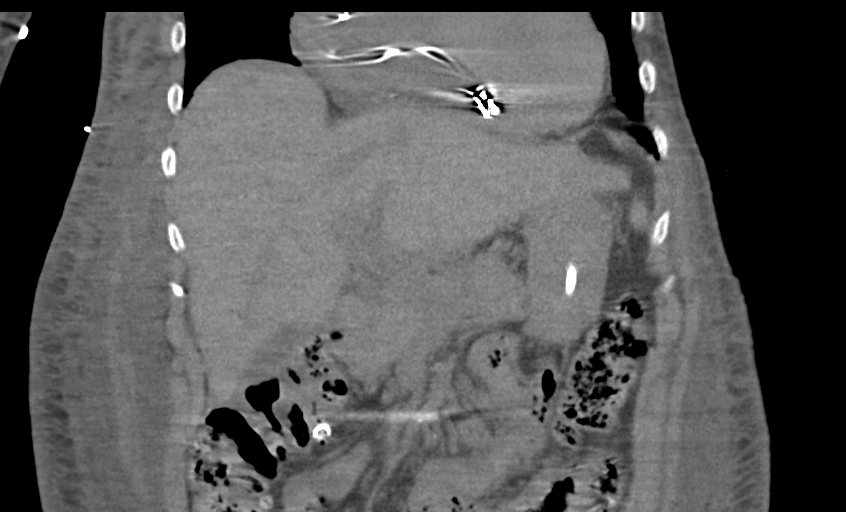
[im 40/90  soft-tissue]
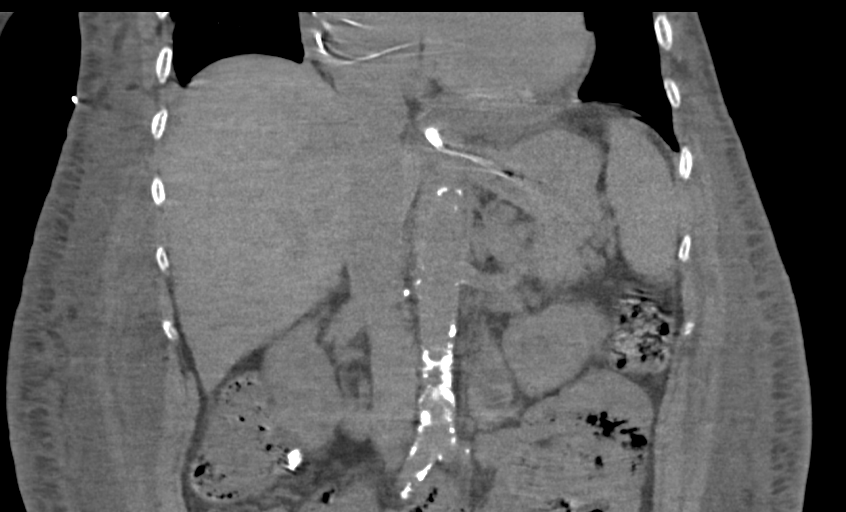
[im 50/90  soft-tissue]
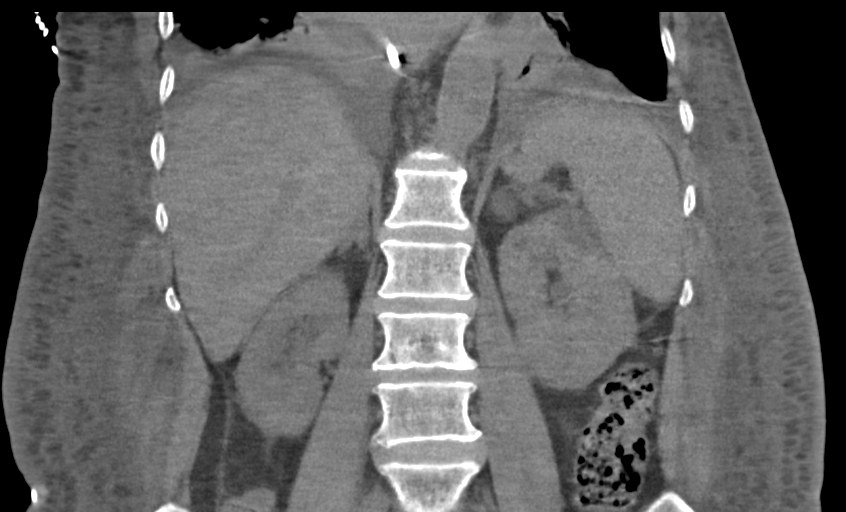

[15 of 46 positions shown; findings below may reference images not displayed]

FINDINGS: Lower chest: Small to moderate right and small left dependent
pleural effusions with mild to moderate passive atelectasis at the
dependent lung bases bilaterally. Cardiomegaly. ICD lead seen in the
right atrium and terminating in the interventricular septum.

Hepatobiliary: Normal liver with no liver mass. Normal gallbladder
with no radiopaque cholelithiasis. No biliary ductal dilatation.

Pancreas: Normal, with no mass or duct dilation.

Spleen: Normal size. No mass.

Adrenals/Urinary Tract: Normal adrenals. Nonobstructing 2 mm
interpolar right renal and 1 mm upper polar left renal stones. No
hydronephrosis. Simple bilateral renal cysts, largest 4.0 cm in the
upper left kidney.

Stomach/Bowel: Weighted enteric tube terminates in gastric antrum.
Stomach is collapsed and grossly normal. Normal caliber small and
large bowel loops. Moderate right colonic diverticulosis with no
large bowel wall thickening or significant pericolonic fat
stranding.

Vascular/Lymphatic: Atherosclerotic nonaneurysmal abdominal aorta.
No pathologically enlarged lymph nodes in the abdomen.

Other: No pneumoperitoneum, ascites or focal fluid collection.
Extensive anasarca.

Musculoskeletal: No aggressive appearing focal osseous lesions. Mild
thoracolumbar spondylosis.
IMPRESSION: 1. Well-positioned enteric tube with tip in the gastric antrum.
Stomach is collapsed and grossly normal.
2. Extensive anasarca.
3. Small to moderate right and small left dependent pleural
effusions with bibasilar passive atelectasis.
4. Cardiomegaly.
5. Nonobstructing bilateral nephrolithiasis.
6. Moderate right colonic diverticulosis.
7. Aortic Atherosclerosis ([C4]-[C4]).

## 2021-02-06 MED ORDER — FUROSEMIDE 10 MG/ML IJ SOLN
60.0000 mg | Freq: Every day | INTRAMUSCULAR | Status: DC
  Administered 2021-02-07 – 2021-02-08 (×2): 60 mg via INTRAVENOUS
  Filled 2021-02-06 (×2): qty 6

## 2021-02-06 NOTE — Progress Notes (Signed)
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  MRI brain negative for acute finding.  Hospital course complicated by AKI and dysphagia.  Nephrology did not feel patient is a candidate for HD and recommended DNR and possible hospice. She pulled out NGT twice but replaced for the third time.  SLP upgraded to dysphagia 1 diet.  Dietitian adjusted tube feeds to 18 hours regimen and encourage p.o. intake with the hope to avoid PEG tube but patient's mental status remain poor still dietitian has adjusted tube feedings to 24-hour feeds.  She also have progressive renal failure with azotemia and possible uremia but Cr has stabilized. Reportedly separated from spouse about a year ago. Sister trying to get HCPOA done here and this was done today with Chaplain as patient was aware of self, place and time and designated her Sister.  Will need to discuss further goals of care with the patient and the patient's sister given her continued poor p.o. intake and sister feels that she is taking in more with her at bedside and with the nursing staff.  The sister has elected to get a PEG tube so that she can discharge from the hospital but does not want this done and does not want to give verbal consent until she is talked to the rest of her family and patient sister wants to be at the hospital when this is happening and the only time that this can be done when she is off of work is next Wednesday 03-01-2021.   Assessment & Plan:   Principal Problem:   Acute metabolic encephalopathy Active Problems:   Insulin dependent type 2 diabetes mellitus (HCC)   Hypertension   History of stroke   CKD (chronic kidney disease), stage IV ([place])   COVID-19  virus infection   Hypernatremia  Acute metabolic encephalopathy/delirium in patient with history of dementia-per sister, able to walk with walker and converse at baseline.  She now have significant azotemia concerning for uremia.  TSH, B12, EEG, CT head and MRI brain negative for acute finding. Neurology recommended treating treatable causes.  -Reorientation and delirium precautions. -Repeat ammonia slightly elevated.  Start lactulose 20 g twice daily given Ammonia Level of 44 -Check RPR and non-reactive -Continue to Monitor closely as she waxes and wanes and showed little bit more lethargic today  Oropharyngeal dysphagia-removed NGT x2, and replaced for the third time -Upgraded to dysphagia 1 diet but not able to take p.o. due to deteriorating mental status. -Resume continuous tube feed again given that her po Intake remains poor -I am not sure if PEG tube can reverse the course but patient is sister is insistent that she get some nutrition and did not want to make her palliative care so she elected for PEG tube placement.  CT scan done of the abdomen pelvis today and her anatomy is amenable to getting a PEG tube.  PEG tube to be placed however patient sister wants to be at the hospital when this is done and this cannot be done till next Wednesday given that she is now the sole caregiver of her sister.  PEG tube is to be placed on March 01, 2021 with consent given on Monday, 02/10/2021 -Palliative Care involved closely but patient's sister  does not want any more palliative discussions  Nonoliguric AKI on CKD-4: Progressive azotemia and possible uremia. Recent Labs (within last 365 days)              Recent Labs    01/24/21 0420 01/25/21 0429 01/27/21 0845 01/29/21 0422 01/30/21 0413 01/31/21 0359 02/01/21 0127 02/02/21 0437 02/03/21 0354 02/04/21 0418  BUN 60* 64* 63* 58* 61* 61* 66* 73* 78* 80*  CREATININE 2.63* 2.59* 2.46* 2.35* 2.38* 2.42* 2.36* 2.57* 2.56* 2.73*    -Renal ultrasound  without acute finding. -Not a candidate for HD per nephrology -IV Lasix 60 mg twice daily and Dr. Cyndia Skeeters discussed risk and benefit with patient's sister at bedside yesterday and her creatinine is slowly trending up so we will go once a day  Pancytopenia: Leukopenia and thrombocytopenia had resolved but now back. FOBT negative. Anemia panel consistent with ACD.  Due to malnutrition? -Continue with ferrous sulfate 300 mg per tube every other day -Patient's WBC is now 3.5, hemoglobin/hematocrit 7.4/24.0, and platelet count is 130 -Use for hemoglobin less than 7 -Continue monitoring  Anasarca secondary to hypoalbuminemia Acute right upper extremity swelling: RUE negative for DVT.  CT suggests anasarca. -IV Lasix as above and was getting IV 60 mg q12h but will change to 60 mg daily -Optimize nutrition and nutritionist to change to tube feedings continuously now -Patient is +1.421 L -May give some Albumin   Hypothermia: Unclear cause of this but resolved.   COVID-19 viral infection: Tested positive on 01/02/2021.  Received remdesivir x3 doses. -No indication for airborne isolation now that she is out of the  Hypernatremia/Hyperkalemia: Resolved and is now slightly hypokalemic  Essential HTN: SBP elevated. -Continue carvedilol 6.25 mg twice daily per tube. and hydralazine 50 mg per tube every 8 -As needed hydralazine with parameters.  Chronic Diastolic CHF:  -No cardiopulmonary symptoms but anasarca. -IV Lasix as above and is now on 60 mg daily -Monitor fluid and respiratory status closely.  History of CVA in 09/2020 with residual left hemiparesis and expressive aphasia:  -CT head and MRI brain without acute finding. -Continue Plavix, Lipitor via tube -PT has recommended SNF and will get TOC to assist with placement  Acute urinary retention -Resolved. -Monitor urinary output.  Goals of care: Multiple comorbidities as above.  Continued to have Progressive decline.  Poor  prognosis.  However, still full code which will pose more harm than benefit.  DNR/DNI recommended by multiple experts including palliative medicine in the past but patient's sister, Ms. Cheek strongly insists on full code and full scope of care. My colleague had discussed patient's progressive decline with Ms. cheek at bedside and I have called her over the phone daily.  [name] is trying to obtain [facility] POA and this has now been done.  [name] states Ms. Mclaine and Mr.Hollinshead were "separated" about a year ago when she went into nursing home.  Ms. Petion lived with Ms. Cheek when she was out of nursing home since then.   Palliative Care reconsulted for [facility] and Notified Ethics team about current situation.  We continued to currently recommend the patient's CODE STATUS be changed to DNR given that her work measures would be somewhat futile given the patient's state of health and current comorbidities.  The patient's mental status continues to wax and wane and she is expressed to the medical team that she would not want tube feedings and is tired however she continues to have very poor p.o. intake.  Currently the  situation is very difficult and despite the current findings artificial feeding/PEG tube will not provide any meaningful recovery she may end up with a PEG tube regardless now that her sister is healthcare power of attorney.  Sister has elected for PEG tube placement and this will be done next week  Abnormal LFTs -Mild.  AST is 48, ALT 69 -Continue to monitor and trend and repeat CMP in a.m. -If necessary will obtain a right upper quadrant ultrasound and acute hepatitis panel but they have been intermittently elevated since the beginning of her admission on 01/02/2021  Overweight Body mass index is 29.21 kg/m. Nutrition Problem: Increased nutrient needs Etiology: acute illness (COVID-19 infection) Signs/Symptoms: estimated needs Interventions: Magic cup,Ensure Enlive (each supplement  provides 350kcal and 20 grams of protein),Tube feeding  Pressure Injury Pressure Injury Sacrum Mid red open skin (Active)     Location: Sacrum  Location Orientation: Mid  Staging:   Wound Description (Comments): red open skin  Present on Admission: Yes   DVT prophylaxis: Heparin 5,000 units sq q8h Code Status: FULL CODE  Family Communication: No family present at bedside call sister and discussed with her the case extensively Disposition Plan: SNF placement if her oral intake picks up and her tube feedings via he Cortrak have  been removed; May need LTAC   Status is: Inpatient  Remains inpatient appropriate because:Unsafe d/c plan, IV treatments appropriate due to intensity of illness or inability to take PO and Inpatient level of care appropriate due to severity of illness   Dispo:  Patient From: [facility]  Planned Disposition: [facility]  Expected discharge date: 02/08/2021  Medically stable for discharge:       Consultants:   Nephrology  Neurology  Palliative Care Medicine  Interventional Radiology  Discussed with Ethics Team Dr. Emeterio Reeve    Procedures: NG Placement   Antimicrobials:  Anti-infectives (From admission, onward)   Start     Dose/Rate Route Frequency Ordered Stop   01/07/21 0800  ceFEPIme (MAXIPIME) 2 g in sodium chloride 0.9 % 100 mL IVPB  Status:  Discontinued        2 g 200 mL/hr over 30 Minutes Intravenous Daily 01/06/21 1159 01/08/21 1257   01/05/21 1500  vancomycin (VANCOCIN) IVPB 1000 mg/200 mL premix  Status:  Discontinued        1,000 mg 200 mL/hr over 60 Minutes Intravenous Every 24 hours 01/05/21 1357 01/05/21 1414   01/05/21 1500  vancomycin (VANCOCIN) IVPB 1000 mg/200 mL premix  Status:  Discontinued        1,000 mg 200 mL/hr over 60 Minutes Intravenous Every 48 hours 01/05/21 1414 01/06/21 1146   01/05/21 1445  ceFEPIme (MAXIPIME) 2 g in sodium chloride 0.9 % 100 mL IVPB  Status:  Discontinued         2 g 200 mL/hr over 30 Minutes Intravenous Daily 01/05/21 1351 01/06/21 1146   01/03/21 1000  remdesivir 100 mg in sodium chloride 0.9 % 100 mL IVPB  Status:  Discontinued       "Followed by" Linked Group Details   100 mg 200 mL/hr over 30 Minutes Intravenous Daily 01/02/21 2257 01/04/21 1330   01/02/21 2300  remdesivir 200 mg in sodium chloride 0.9% 250 mL IVPB       "Followed by" Linked Group Details   200 mg 580 mL/hr over 30 Minutes Intravenous Once 01/02/21 2257 01/03/21 0147        Subjective: Seen and examined at bedside was confused and  a little lethargic.  She would moan on questioning but not really give a subjective history or appropriate answers.  Called the sister and sister is elected for PEG tube placement and this will be done next week given that this is only time patient sister is off.  No nausea or vomiting per nursing.  Nursing states that her appetite is still extremely poor despite the patient's sister insisting that she eats more with the patient's sister.  No other concerns or complaints at this time.  Objective: Vitals:   02/06/21 0221 02/06/21 0403 02/06/21 1040 02/06/21 1120  BP:  (!) 137/114 (!) 161/66 (!) 147/58  Pulse:  60 66 63  Resp:  16 17 18   Temp:  97.6 F (36.4 C) (!) 96.8 F (36 C)   TempSrc:  Axillary Axillary   SpO2:  100% 100% 100%  Weight: 85 kg     Height:        Intake/Output Summary (Last 24 hours) at 02/06/2021 1627 Last data filed at 02/06/2021 1300 Gross per 24 hour  Intake 900 ml  Output 1150 ml  Net -250 ml   Filed Weights   02/04/21 0454 02/05/21 0600 02/06/21 0221  Weight: 84.6 kg 85.5 kg 85 kg   Examination: Physical Exam:  Constitutional: Overweight African-American female currently who is ill-appearing and continues to have a core track in place and unable to provide a subjective history today given her current condition Eyes: Lids and conjunctivae normal, sclerae anicteric  ENMT: External Ears, Nose appear normal.  Grossly normal hearing.  Neck: Appears normal, supple, no cervical masses, normal ROM, no appreciable thyromegaly; no JVD Respiratory: Diminished to auscultation bilaterally with coarse breath sounds, no wheezing, rales, rhonchi or crackles. Normal respiratory effort and patient is not tachypenic. No accessory muscle use.  Not wearing any supplemental oxygen via nasal cannula and has unlabored breathing Cardiovascular: RRR, no murmurs / rubs / gallops. S1 and S2 auscultated.  1+ lower extremity edema Abdomen: Soft, non-tender, distended secondary body habitus. Bowel sounds positive.  GU: Deferred. Musculoskeletal: No clubbing / cyanosis of digits/nails. No joint deformity upper and lower extremities.  Skin: No rashes, lesions, ulcers on limited skin evaluation. No induration; Warm and dry.  Neurologic: Only moans and does not really follow commands today Psychiatric: Impaired judgment and insight. Drowsy and Lethargic and not oriented x 3. Normal mood and appropriate affect.   Data Reviewed: I have personally reviewed following labs and imaging studies  CBC: Recent Labs  Lab 01/31/21 0359 02/01/21 0127 02/02/21 0437 02/03/21 0354 02/04/21 0418 02/05/21 0426 02/06/21 0415  WBC 2.6* 2.2* 4.3  --   --  3.3* 3.5*  NEUTROABS  --   --   --   --   --   --  2.2  HGB 8.3* 8.0* 7.8* 7.5* 8.0* 7.6* 7.4*  HCT 27.5* 26.4* 25.1* 24.5* 26.9* 25.1* 24.0*  MCV 95.8 95.7 93.7  --   --  95.4 94.9  PLT 148* 149* 163  --   --  140* 063*   Basic Metabolic Panel: Recent Labs  Lab 02/02/21 0437 02/03/21 0354 02/04/21 0418 02/05/21 0426 02/06/21 0415  NA 138 137 136 134* 135  K 4.0 4.0 4.0 4.0 4.0  CL 104 104 103 100 100  CO2 24 24 22 22 24   GLUCOSE 144* 133* 168* 175* 132*  BUN 73* 78* 80* 85* 90*  CREATININE 2.57* 2.56* 2.73* 2.66* 2.77*  CALCIUM 8.4* 8.7* 8.9 8.9 8.9  MG 2.1 2.1 2.3 2.4  2.2  PHOS 3.0 3.2 3.8 4.1 4.1   GFR: Estimated Creatinine Clearance: 19.4 mL/min (A) (by C-G formula  based on SCr of 2.77 mg/dL (H)). Liver Function Tests: Recent Labs  Lab 02/02/21 0437 02/03/21 0354 02/04/21 0418 02/05/21 0426 02/06/21 0415  AST  --   --   --   --  48*  ALT  --   --   --   --  69*  ALKPHOS  --   --   --   --  213*  BILITOT  --   --   --   --  0.8  PROT  --   --   --   --  6.5  ALBUMIN 2.4* 2.3* 2.4* 2.4* 2.4*   No results for input(s): LIPASE, AMYLASE in the last 168 hours. Recent Labs  Lab 02/03/21 2326  AMMONIA 44*   Coagulation Profile: No results for input(s): INR, PROTIME in the last 168 hours. Cardiac Enzymes: Recent Labs  Lab 02/02/21 0437  CKTOTAL 41   BNP (last 3 results) No results for input(s): PROBNP in the last 8760 hours. HbA1C: No results for input(s): HGBA1C in the last 72 hours. CBG: Recent Labs  Lab 02/05/21 2022 02/06/21 0015 02/06/21 0401 02/06/21 0733 02/06/21 1114  GLUCAP 163* 155* 122* 131* 139*   Lipid Profile: No results for input(s): CHOL, HDL, LDLCALC, TRIG, CHOLHDL, LDLDIRECT in the last 72 hours. Thyroid Function Tests: Recent Labs    02/03/21 2326  TSH 3.124   Anemia Panel: Recent Labs    02/03/21 2326  VITAMINB12 745   Sepsis Labs: No results for input(s): PROCALCITON, LATICACIDVEN in the last 168 hours.  No results found for this or any previous visit (from the past 240 hour(s)).   RN Pressure Injury Documentation: Pressure Injury Sacrum Mid red open skin (Active)     Location: Sacrum  Location Orientation: Mid  Staging:   Wound Description (Comments): red open skin  Present on Admission: Yes     Estimated body mass index is 29.35 kg/m as calculated from the following:   Height as of this encounter: 5\' 7"  (1.702 m).   Weight as of this encounter: 85 kg.  Malnutrition Type:  Nutrition Problem: Increased nutrient needs Etiology: acute illness (COVID-19 infection)   Malnutrition Characteristics:  Signs/Symptoms: estimated needs   Nutrition Interventions:  Interventions: Magic  cup,Ensure Enlive (each supplement provides 350kcal and 20 grams of protein),Tube feeding   Radiology Studies: No results found.  Scheduled Meds: . atorvastatin  80 mg Per Tube Daily  . carvedilol  6.25 mg Per NG tube BID WC  . chlorhexidine  15 mL Mouth Rinse BID  . dorzolamide-timolol  1 drop Both Eyes BID  . ferrous sulfate  300 mg Per Tube QODAY  . free water  200 mL Per Tube QID  . furosemide  60 mg Intravenous Q12H  . heparin injection (subcutaneous)  5,000 Units Subcutaneous Q8H  . hydrALAZINE  50 mg Per Tube Q8H  . insulin aspart  0-9 Units Subcutaneous Q4H  . insulin glargine  5 Units Subcutaneous BID  . lactulose  20 g Per Tube BID  . mouth rinse  15 mL Mouth Rinse q12n4p  . nystatin  5 mL Mouth/Throat QID  . pantoprazole sodium  40 mg Per Tube Daily   Continuous Infusions: . feeding supplement (NEPRO CARB STEADY) 1,000 mL (02/06/21 1330)    LOS: 34 days   Kerney Elbe, DO [facility] PAGER is on [place]  If  7PM-7AM, please contact night-coverage [email or web address]

## 2021-02-06 NOTE — Progress Notes (Signed)
Patient has been very lethargic and I have been unable to give her anything by mouth. Went to CT and returned. Removed tele and continues to be on room air. [place] in place. Safety maintained, will continue to monitor.

## 2021-02-06 NOTE — Progress Notes (Signed)
IR consulted by Dr. Alfredia Ferguson for possible image-guided percutaneous gastrostomy tube placement.  Images have been reviewed by Dr. Anselm Pancoast who states patient's anatomy is amendable to percutaneous placement. Chart reviewed today, in addition discussed case with [facility] and palliative care. This is a very difficult situation- patient's mentation waxing/waning, once stating she did not want tube feeds but now altered, husband refuses to participate in care and HCPOA currently patient's sister Desiree Pollard) who wants to pursue all aggressive interventions. At this time plan to proceed with gastrostomy tube placement in hopes to move towards discharge.  Called patient's sister, Desiree Pollard, at 44 to discuss possible procedure/obtain consent. Desiree Pollard states that she was not aware that "this would happen so quickly". She requests multiple thing: 1- that she is at [facility] during day of procedure (she is aware she will not be able to come to IR during procedure) which cannot occur until Wednesday 03/09/2021 and 2- that she does not give verbal consent until she has time to "let the family know what is going on", requests IR PA to call back regarding consent on Monday 02/10/2021.  At this time, plan for image-guided percutaneous gastrostomy tube placement in IR tentatively for Wednesday 03-09-21 pending IR scheduling. In addition, plan for consent to be obtained by patient's sister via telephone on Monday 02/10/2021. Dr. Anselm Pancoast (IR) and Dr. Alfredia Ferguson made aware of above. Formal consult to follow early next week.  Please call IR with questions/concerns.   Desiree Graff Levern Pitter, PA-C 02/06/2021, 3:54 PM

## 2021-02-07 DIAGNOSIS — N184 Chronic kidney disease, stage 4 (severe): Secondary | ICD-10-CM | POA: Diagnosis not present

## 2021-02-07 DIAGNOSIS — R4701 Aphasia: Secondary | ICD-10-CM | POA: Diagnosis not present

## 2021-02-07 DIAGNOSIS — U071 COVID-19: Secondary | ICD-10-CM | POA: Diagnosis not present

## 2021-02-07 DIAGNOSIS — G9341 Metabolic encephalopathy: Secondary | ICD-10-CM | POA: Diagnosis not present

## 2021-02-07 LAB — CBC WITH DIFFERENTIAL/PLATELET
Abs Immature Granulocytes: 0.12 10*3/uL — ABNORMAL HIGH (ref 0.00–0.07)
Abs Immature Granulocytes: 0.17 10*3/uL — ABNORMAL HIGH (ref 0.00–0.07)
Basophils Absolute: 0 10*3/uL (ref 0.0–0.1)
Basophils Absolute: 0.1 10*3/uL (ref 0.0–0.1)
Basophils Relative: 1 %
Basophils Relative: 1 %
Eosinophils Absolute: 0.1 10*3/uL (ref 0.0–0.5)
Eosinophils Absolute: 0.1 10*3/uL (ref 0.0–0.5)
Eosinophils Relative: 4 %
Eosinophils Relative: 3 %
HCT: 24 % — ABNORMAL LOW (ref 36.0–46.0)
HCT: 23.6 % — ABNORMAL LOW (ref 36.0–46.0)
Hemoglobin: 7.1 g/dL — ABNORMAL LOW (ref 12.0–15.0)
Hemoglobin: 7.3 g/dL — ABNORMAL LOW (ref 12.0–15.0)
Immature Granulocytes: 4 %
Immature Granulocytes: 4 %
Lymphocytes Relative: 14 %
Lymphocytes Relative: 14 %
Lymphs Abs: 0.5 10*3/uL — ABNORMAL LOW (ref 0.7–4.0)
Lymphs Abs: 0.6 10*3/uL — ABNORMAL LOW (ref 0.7–4.0)
MCH: 28.4 pg (ref 26.0–34.0)
MCH: 29 pg (ref 26.0–34.0)
MCHC: 29.6 g/dL — ABNORMAL LOW (ref 30.0–36.0)
MCHC: 30.9 g/dL (ref 30.0–36.0)
MCV: 96 fL (ref 80.0–100.0)
MCV: 93.7 fL (ref 80.0–100.0)
Monocytes Absolute: 0.4 10*3/uL (ref 0.1–1.0)
Monocytes Absolute: 0.4 10*3/uL (ref 0.1–1.0)
Monocytes Relative: 10 %
Monocytes Relative: 10 %
Neutro Abs: 2.4 10*3/uL (ref 1.7–7.7)
Neutro Abs: 2.8 10*3/uL (ref 1.7–7.7)
Neutrophils Relative %: 67 %
Neutrophils Relative %: 68 %
Platelets: 120 10*3/uL — ABNORMAL LOW (ref 150–400)
Platelets: 117 10*3/uL — ABNORMAL LOW (ref 150–400)
RBC: 2.5 MIL/uL — ABNORMAL LOW (ref 3.87–5.11)
RBC: 2.52 MIL/uL — ABNORMAL LOW (ref 3.87–5.11)
RDW: 16.2 % — ABNORMAL HIGH (ref 11.5–15.5)
RDW: 16.3 % — ABNORMAL HIGH (ref 11.5–15.5)
WBC: 3.5 10*3/uL — ABNORMAL LOW (ref 4.0–10.5)
WBC: 4.1 10*3/uL (ref 4.0–10.5)
nRBC: 0 % (ref 0.0–0.2)
nRBC: 0 % (ref 0.0–0.2)

## 2021-02-07 LAB — PHOSPHORUS: Phosphorus: 4.2 mg/dL (ref 2.5–4.6)

## 2021-02-07 LAB — COMPREHENSIVE METABOLIC PANEL
ALT: 84 U/L — ABNORMAL HIGH (ref 0–44)
AST: 69 U/L — ABNORMAL HIGH (ref 15–41)
Albumin: 2.4 g/dL — ABNORMAL LOW (ref 3.5–5.0)
Alkaline Phosphatase: 249 U/L — ABNORMAL HIGH (ref 38–126)
Anion gap: 10 (ref 5–15)
BUN: 100 mg/dL — ABNORMAL HIGH (ref 8–23)
CO2: 26 mmol/L (ref 22–32)
Calcium: 8.9 mg/dL (ref 8.9–10.3)
Chloride: 101 mmol/L (ref 98–111)
Creatinine, Ser: 2.8 mg/dL — ABNORMAL HIGH (ref 0.44–1.00)
GFR, Estimated: 17 mL/min — ABNORMAL LOW (ref >60)
Glucose, Bld: 178 mg/dL — ABNORMAL HIGH (ref 70–99)
Potassium: 4.2 mmol/L (ref 3.5–5.1)
Sodium: 137 mmol/L (ref 135–145)
Total Bilirubin: 0.5 mg/dL (ref 0.3–1.2)
Total Protein: 6.4 g/dL — ABNORMAL LOW (ref 6.5–8.1)

## 2021-02-07 LAB — GLUCOSE, CAPILLARY
Glucose-Capillary: 141 mg/dL — ABNORMAL HIGH (ref 70–99)
Glucose-Capillary: 147 mg/dL — ABNORMAL HIGH (ref 70–99)
Glucose-Capillary: 160 mg/dL — ABNORMAL HIGH (ref 70–99)
Glucose-Capillary: 177 mg/dL — ABNORMAL HIGH (ref 70–99)
Glucose-Capillary: 200 mg/dL — ABNORMAL HIGH (ref 70–99)
Glucose-Capillary: 209 mg/dL — ABNORMAL HIGH (ref 70–99)

## 2021-02-07 LAB — PREPARE RBC (CROSSMATCH)

## 2021-02-07 LAB — MAGNESIUM: Magnesium: 2.1 mg/dL (ref 1.7–2.4)

## 2021-02-07 MED ORDER — SODIUM CHLORIDE 0.9% IV SOLUTION: Freq: Once | INTRAVENOUS | Status: AC

## 2021-02-07 NOTE — Plan of Care (Signed)
  Problem: Clinical Measurements: Goal: Respiratory complications will improve Outcome: Adequate for Discharge   

## 2021-02-07 NOTE — Plan of Care (Signed)
  Problem: Education: Goal: Knowledge of General Education information will improve Description: Including pain rating scale, medication(s)/side effects and non-pharmacologic comfort measures Outcome: Not Progressing   Problem: Health Behavior/Discharge Planning: Goal: Ability to manage health-related needs will improve Outcome: Not Progressing   Problem: Clinical Measurements: Goal: Ability to maintain clinical measurements within normal limits will improve Outcome: Not Progressing Goal: Will remain free from infection Outcome: Not Progressing Goal: Diagnostic test results will improve Outcome: Not Progressing Goal: Respiratory complications will improve Outcome: Not Progressing Goal: Cardiovascular complication will be avoided Outcome: Not Progressing   Problem: Activity: Goal: Risk for activity intolerance will decrease Outcome: Not Progressing   Problem: Nutrition: Goal: Adequate nutrition will be maintained Outcome: Not Progressing   Problem: Coping: Goal: Level of anxiety will decrease Outcome: Not Progressing   Problem: Elimination: Goal: Will not experience complications related to bowel motility Outcome: Not Progressing Goal: Will not experience complications related to urinary retention Outcome: Not Progressing   Problem: Pain Managment: Goal: General experience of comfort will improve Outcome: Not Progressing   Problem: Safety: Goal: Ability to remain free from injury will improve Outcome: Not Progressing   Problem: Skin Integrity: Goal: Risk for impaired skin integrity will decrease Outcome: Not Progressing   Problem: Education: Goal: Knowledge of risk factors and measures for prevention of condition will improve Outcome: Not Progressing   Problem: Coping: Goal: Psychosocial and spiritual needs will be supported Outcome: Not Progressing   Problem: Respiratory: Goal: Will maintain a patent airway Outcome: Not Progressing Goal: Complications  related to the disease process, condition or treatment will be avoided or minimized Outcome: Not Progressing   Problem: Education: Goal: Knowledge of risk factors and measures for prevention of condition will improve Outcome: Not Progressing   Problem: Coping: Goal: Psychosocial and spiritual needs will be supported Outcome: Not Progressing   Problem: Respiratory: Goal: Will maintain a patent airway Outcome: Not Progressing Goal: Complications related to the disease process, condition or treatment will be avoided or minimized Outcome: Not Progressing

## 2021-02-07 NOTE — Progress Notes (Signed)
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  MRI brain negative for acute finding.  Hospital course complicated by AKI and dysphagia.  Nephrology did not feel patient is a candidate for HD and recommended DNR and possible hospice. She pulled out NGT twice but replaced for the third time.  SLP upgraded to dysphagia 1 diet.  Dietitian adjusted tube feeds to 18 hours regimen and encourage p.o. intake with the hope to avoid PEG tube but patient's mental status remain poor still dietitian has adjusted tube feedings to 24-hour feeds.  She also have progressive renal failure with azotemia and possible uremia but Cr has stabilized. Reportedly separated from spouse about a year ago. Sister trying to get HCPOA done here and this was done today with Chaplain as patient was aware of self, place and time and designated her Sister.  Will need to discuss further goals of care with the patient and the patient's sister given her continued poor p.o. intake and sister feels that she is taking in more with her at bedside and with the nursing staff.  The sister has elected to get a PEG tube so that she can discharge from the hospital but does not want this done and does not want to give verbal consent until she is talked to the rest of her family and patient sister wants to be at the hospital when this is happening and the only time that this can be done when she is off of work is next Wednesday Mar 13, 2021.   Assessment & Plan:   Principal Problem:   Acute metabolic encephalopathy Active Problems:   Insulin dependent type 2 diabetes mellitus (HCC)   Hypertension   History of stroke   CKD (chronic kidney disease), stage IV ([place])   COVID-19  virus infection   Hypernatremia  Acute metabolic encephalopathy/delirium in patient with history of dementia-per sister, able to walk with walker and converse at baseline.  She now have significant azotemia concerning for uremia.  TSH, B12, EEG, CT head and MRI brain negative for acute finding. Neurology recommended treating treatable causes.  -Reorientation and delirium precautions. -Repeat ammonia slightly elevated.  Start lactulose 20 g twice daily given Ammonia Level of 44 -Check RPR and non-reactive -Continue to Monitor closely as she waxes and wanes and she was a little more awake today   Oropharyngeal dysphagia-removed NGT x2, and replaced for the third time -Upgraded to dysphagia 1 diet but not able to take p.o. due to deteriorating mental status. -Resume continuous tube feed again given that her po Intake remains poor -I am not sure if PEG tube can reverse the course but patient is sister is insistent that she get some nutrition and did not want to make her palliative care so she elected for PEG tube placement.  CT scan done of the abdomen pelvis today and her anatomy is amenable to getting a PEG tube.  PEG tube to be placed however patient sister wants to be at the hospital when this is done and this cannot be done till next Wednesday given that she is now the sole caregiver of her sister.  PEG tube is to be placed on 03/13/21 with consent given on Monday, 02/10/2021 -Palliative Care involved closely but  patient's sister does not want any more palliative discussions  Nonoliguric AKI on CKD-4:  -Progressive azotemia and possible uremia. -BUN/Cr the last week or so has been trending up very slightly; BUN/Cr went from 85/2.66 -> 90/2.77 -> 100/2.80 and relatively stable with a GFR is ranging from 17-21 the last week or so -Renal ultrasound without acute finding. -Not a candidate for HD per nephrology -IV Lasix 60 mg twice daily and Dr. Cyndia Skeeters discussed risk and benefit with patient's  sister at bedside yesterday and her creatinine is slowly trending up so reduced to 1x day and will go to 40 mg po at D/C -I spoke with Dr. Jonnie Finner of nephrology who feels that there is really not much else to offer as they will not going to do anything with her elevated BUN/creatinine but recommends to cut back on the Lasix as appropriate  Pancytopenia: Leukopenia and thrombocytopenia had resolved but now back. FOBT negative. Anemia panel consistent with ACD.  Due to malnutrition? -Continue with ferrous sulfate 300 mg per tube every other day -Patient's WBC is now 3.5, hemoglobin/hematocrit has been slowly trending down and today is 7.1/24.0 so we will type and screen again and transfuse 1 unit PRBCs, and platelet count is 120 -Will transfuse 1 unit of pRBC and continue to transfuse for hemoglobin less than 7 -Continue monitoring  Anasarca secondary to hypoalbuminemia Acute right upper extremity swelling: RUE negative for DVT.  CT suggests anasarca. -IV Lasix as above and was getting IV 60 mg q12h but will change to 60 mg daily to p.o. 40 at discharge -Optimize nutrition and nutritionist to change to tube feedings continuously now -Patient is +1.761 L -May give some Albumin but after discussion with nephrology they recommend against it -Recommend elevation and TED hose now  Hypothermia: Unclear cause of this but resolved.   COVID-19 viral infection: Tested positive on 01/02/2021.  Received remdesivir x3 doses. -No indication for airborne isolation now that she is out of the  Hypernatremia/Hyperkalemia: Resolved and is now slightly hypokalemic  Essential HTN: SBP elevated. -Continue carvedilol 6.25 mg twice daily per tube. and hydralazine 50 mg per tube every 8 -As needed hydralazine with parameters. -Continue to monitor blood pressures per protocol and blood pressure was slightly elevated at 098/11  Chronic Diastolic CHF:  -No cardiopulmonary symptoms but anasarca. -IV Lasix as  above and is now on 60 mg daily -Monitor fluid and respiratory status closely; as above  History of CVA in 09/2020 with residual left hemiparesis and expressive aphasia:  -CT head and MRI brain without acute finding. -Continue Plavix, Lipitor via tube -PT has recommended SNF and will get TOC to assist with placement  Acute urinary retention -Resolved. -Monitor urinary output.  Goals of care: Multiple comorbidities as above.  Continued to have Progressive decline.  Poor prognosis.  However, still full code which will pose more harm than benefit.  DNR/DNI recommended by multiple experts including palliative medicine in the past but patient's sister, Ms. Cheek strongly insists on full code and full scope of care. My colleague had discussed patient's progressive decline with Ms. cheek at bedside and I have called her over the phone daily.  [name] is trying to obtain [place] POA and this has now been done.  [name] states Ms. Enck and Mr.Timson were "separated" about a year ago when she went into nursing home.  Ms. Rubey lived with Ms. Cheek when she was out of nursing home since then.   Palliative Care reconsulted for [facility]  and Notified Ethics team about current situation.  We continued to currently recommend the patient's CODE STATUS be changed to DNR given that her work measures would be somewhat futile given the patient's state of health and current comorbidities.  The patient's mental status continues to wax and wane and she is expressed to the medical team that she would not want tube feedings and is tired however she continues to have very poor p.o. intake.  Currently the situation is very difficult and despite the current findings artificial feeding/PEG tube will not provide any meaningful recovery she may end up with a PEG tube regardless now that her sister is healthcare power of attorney.  Sister has elected for PEG tube placement and this will be done in the middle of next  week  Abnormal LFTs -Mild.  AST is 48, ALT 69 but have been relatively elevated since the beginning of her admission; now AST 69 and ALT is 84 and will continue monitor and trend -Continue to monitor and trend and repeat CMP in a.m. -If necessary will obtain a right upper quadrant ultrasound and acute hepatitis panel but they have been intermittently elevated since the beginning of her admission on 01/02/2021 so we will watch for now  Overweight Body mass index is 29.21 kg/m. Nutrition Problem: Increased nutrient needs Etiology: acute illness (COVID-19 infection) Signs/Symptoms: estimated needs Interventions: Magic cup,Ensure Enlive (each supplement provides 350kcal and 20 grams of protein),Tube feeding  Pressure Injury Pressure Injury Sacrum Mid red open skin (Active)     Location: Sacrum  Location Orientation: Mid  Staging:   Wound Description (Comments): red open skin  Present on Admission: Yes   DVT prophylaxis: Heparin 5,000 units sq q8h; may hold if hemoglobin continues to drop further after blood has been transfused Code Status: FULL CODE  Family Communication: No family present at bedside call sister and discussed with her the case extensively Disposition Plan: SNF placement if her oral intake picks up and her tube feedings via he Cortrak have  been removed and PEG is placed; May need LTAC   Status is: Inpatient  Remains inpatient appropriate because:Unsafe d/c plan, IV treatments appropriate due to intensity of illness or inability to take PO and Inpatient level of care appropriate due to severity of illness   Dispo:  Patient From: [place]  Planned Disposition: [place]  Expected discharge date:02/13/2021  Medically stable for discharge:  No     Consultants:   Nephrology  Neurology  Palliative Care Medicine  Interventional Radiology  Discussed with Ethics Team Dr. Emeterio Reeve    Procedures: NG Placement    Antimicrobials:  Anti-infectives (From admission, onward)   Start     Dose/Rate Route Frequency Ordered Stop   01/07/21 0800  ceFEPIme (MAXIPIME) 2 g in sodium chloride 0.9 % 100 mL IVPB  Status:  Discontinued        2 g 200 mL/hr over 30 Minutes Intravenous Daily 01/06/21 1159 01/08/21 1257   01/05/21 1500  vancomycin (VANCOCIN) IVPB 1000 mg/200 mL premix  Status:  Discontinued        1,000 mg 200 mL/hr over 60 Minutes Intravenous Every 24 hours 01/05/21 1357 01/05/21 1414   01/05/21 1500  vancomycin (VANCOCIN) IVPB 1000 mg/200 mL premix  Status:  Discontinued        1,000 mg 200 mL/hr over 60 Minutes Intravenous Every 48 hours 01/05/21 1414 01/06/21 1146   01/05/21 1445  ceFEPIme (MAXIPIME) 2 g in sodium chloride 0.9 % 100  mL IVPB  Status:  Discontinued        2 g 200 mL/hr over 30 Minutes Intravenous Daily 01/05/21 1351 01/06/21 1146   01/03/21 1000  remdesivir 100 mg in sodium chloride 0.9 % 100 mL IVPB  Status:  Discontinued       "Followed by" Linked Group Details   100 mg 200 mL/hr over 30 Minutes Intravenous Daily 01/02/21 2257 01/04/21 1330   01/02/21 2300  remdesivir 200 mg in sodium chloride 0.9% 250 mL IVPB       "Followed by" Linked Group Details   200 mg 580 mL/hr over 30 Minutes Intravenous Once 01/02/21 2257 01/03/21 0147        Subjective: Seen and examined at bedside and she was a little more awake and interactive.  Nursing feels that her NG is becoming a little bit more difficult to push medications and feedings but still usable now.  Her blood count has dropped slightly so we will type and screen and transfuse 1 unit PRBCs.  Order TED hose as well.  Plan is still to get the PEG tube next week.  No other concerns or complaints at this time.  Objective: Vitals:   02/06/21 1120 02/06/21 2256 02/07/21 0419 02/07/21 0924  BP: (!) 147/58 (!) 160/57 (!) 154/63 (!) 171/64  Pulse: 63 67 69 70  Resp: 18 20 20 17   Temp:  97.8 F (36.6 C) 97.6 F (36.4 C) 98.6 F (37  C)  TempSrc:  Axillary Axillary Oral  SpO2: 100% 97% 100% 100%  Weight:      Height:        Intake/Output Summary (Last 24 hours) at 02/07/2021 1138 Last data filed at 02/07/2021 0152 Gross per 24 hour  Intake 740 ml  Output 850 ml  Net -110 ml   Filed Weights   02/04/21 0454 02/05/21 0600 02/06/21 0221  Weight: 84.6 kg 85.5 kg 85 kg   Examination: Physical Exam:  Constitutional: The patient is a chronically ill-appearing overweight African-American female who continues to have a Cortrak in place a little bit more awake today and does answer some questions remains slightly incomprehensible Eyes: Lids and conjunctivae normal, sclerae anicteric  ENMT: External Ears, Nose appear normal. Grossly normal hearing; Cortrak in Right Nare Neck: Appears normal, supple, no cervical masses, normal ROM, no appreciable thyromegaly Respiratory: Diminished to auscultation bilaterally with coarse breath sounds, no wheezing, rales, rhonchi or crackles. Normal respiratory effort and patient is not tachypenic. No accessory muscle use.  Unlabored breathing Cardiovascular: RRR, no murmurs / rubs / gallops. S1 and S2 auscultated.  2+ lower extremity edema and also has right upper arm swelling Abdomen: Soft, non-tender, distended secondary to body habitus. Bowel sounds positive.  GU: Deferred. Musculoskeletal: No clubbing / cyanosis of digits/nails. No joint deformity upper and lower extremities.  Skin: No rashes, lesions, ulcers on limited skin evaluation. No induration; Warm and dry.  Neurologic: She is awake and does answer questions.  Is incomprehensible and has some dysarthria Psychiatric: Slightly impaired judgment and insight.  She is awake and alert but not fully oriented x 3. Normal mood and appropriate affect.   Data Reviewed: I have personally reviewed following labs and imaging studies  CBC: Recent Labs  Lab 02/01/21 0127 02/02/21 0437 02/03/21 0354 02/04/21 0418 02/05/21 0426  02/06/21 0415 02/07/21 0404  WBC 2.2* 4.3  --   --  3.3* 3.5* 3.5*  NEUTROABS  --   --   --   --   --  2.2 2.4  HGB 8.0* 7.8* 7.5* 8.0* 7.6* 7.4* 7.1*  HCT 26.4* 25.1* 24.5* 26.9* 25.1* 24.0* 24.0*  MCV 95.7 93.7  --   --  95.4 94.9 96.0  PLT 149* 163  --   --  140* 130* 469*   Basic Metabolic Panel: Recent Labs  Lab 02/03/21 0354 02/04/21 0418 02/05/21 0426 02/06/21 0415 02/07/21 0404  NA 137 136 134* 135 137  K 4.0 4.0 4.0 4.0 4.2  CL 104 103 100 100 101  CO2 24 22 22 24 26   GLUCOSE 133* 168* 175* 132* 178*  BUN 78* 80* 85* 90* 100*  CREATININE 2.56* 2.73* 2.66* 2.77* 2.80*  CALCIUM 8.7* 8.9 8.9 8.9 8.9  MG 2.1 2.3 2.4 2.2 2.1  PHOS 3.2 3.8 4.1 4.1 4.2   GFR: Estimated Creatinine Clearance: 19.2 mL/min (A) (by C-G formula based on SCr of 2.8 mg/dL (H)). Liver Function Tests: Recent Labs  Lab 02/03/21 0354 02/04/21 0418 02/05/21 0426 02/06/21 0415 02/07/21 0404  AST  --   --   --  48* 69*  ALT  --   --   --  69* 84*  ALKPHOS  --   --   --  213* 249*  BILITOT  --   --   --  0.8 0.5  PROT  --   --   --  6.5 6.4*  ALBUMIN 2.3* 2.4* 2.4* 2.4* 2.4*   No results for input(s): LIPASE, AMYLASE in the last 168 hours. Recent Labs  Lab 02/03/21 2326  AMMONIA 44*   Coagulation Profile: No results for input(s): INR, PROTIME in the last 168 hours. Cardiac Enzymes: Recent Labs  Lab 02/02/21 0437  CKTOTAL 41   BNP (last 3 results) No results for input(s): PROBNP in the last 8760 hours. HbA1C: No results for input(s): HGBA1C in the last 72 hours. CBG: Recent Labs  Lab 02/06/21 1628 02/06/21 2005 02/07/21 0003 02/07/21 0416 02/07/21 0736  GLUCAP 123* 181* 209* 160* 147*   Lipid Profile: No results for input(s): CHOL, HDL, LDLCALC, TRIG, CHOLHDL, LDLDIRECT in the last 72 hours. Thyroid Function Tests: No results for input(s): TSH, T4TOTAL, FREET4, T3FREE, THYROIDAB in the last 72 hours. Anemia Panel: No results for input(s): VITAMINB12, FOLATE, FERRITIN,  TIBC, IRON, RETICCTPCT in the last 72 hours. Sepsis Labs: No results for input(s): PROCALCITON, LATICACIDVEN in the last 168 hours.  No results found for this or any previous visit (from the past 240 hour(s)).   RN Pressure Injury Documentation: Pressure Injury Sacrum Mid red open skin (Active)     Location: Sacrum  Location Orientation: Mid  Staging:   Wound Description (Comments): red open skin  Present on Admission: Yes     Estimated body mass index is 29.35 kg/m as calculated from the following:   Height as of this encounter: 5\' 7"  (1.702 m).   Weight as of this encounter: 85 kg.  Malnutrition Type:  Nutrition Problem: Increased nutrient needs Etiology: acute illness (COVID-19 infection)   Malnutrition Characteristics:  Signs/Symptoms: estimated needs   Nutrition Interventions:  Interventions: Magic cup,Ensure Enlive (each supplement provides 350kcal and 20 grams of protein),Tube feeding   Radiology Studies: CT ABDOMEN WO CONTRAST  Result Date: 02/06/2021 CLINICAL DATA:  Inpatient. Assess anatomy for possible gastrostomy tube placement. Chronic kidney disease. EXAM: CT ABDOMEN WITHOUT CONTRAST TECHNIQUE: Multidetector CT imaging of the abdomen was performed following the standard protocol without IV contrast. COMPARISON:  01/26/2021 abdominal radiograph. 12/01/2014 CT abdomen/pelvis. FINDINGS: Lower chest: Small to moderate right  and small left dependent pleural effusions with mild to moderate passive atelectasis at the dependent lung bases bilaterally. Cardiomegaly. ICD lead seen in the right atrium and terminating in the interventricular septum. Hepatobiliary: Normal liver with no liver mass. Normal gallbladder with no radiopaque cholelithiasis. No biliary ductal dilatation. Pancreas: Normal, with no mass or duct dilation. Spleen: Normal size. No mass. Adrenals/Urinary Tract: Normal adrenals. Nonobstructing 2 mm interpolar right renal and 1 mm upper polar left renal  stones. No hydronephrosis. Simple bilateral renal cysts, largest 4.0 cm in the upper left kidney. Stomach/Bowel: Weighted enteric tube terminates in gastric antrum. Stomach is collapsed and grossly normal. Normal caliber small and large bowel loops. Moderate right colonic diverticulosis with no large bowel wall thickening or significant pericolonic fat stranding. Vascular/Lymphatic: Atherosclerotic nonaneurysmal abdominal aorta. No pathologically enlarged lymph nodes in the abdomen. Other: No pneumoperitoneum, ascites or focal fluid collection. Extensive anasarca. Musculoskeletal: No aggressive appearing focal osseous lesions. Mild thoracolumbar spondylosis. IMPRESSION: 1. Well-positioned enteric tube with tip in the gastric antrum. Stomach is collapsed and grossly normal. 2. Extensive anasarca. 3. Small to moderate right and small left dependent pleural effusions with bibasilar passive atelectasis. 4. Cardiomegaly. 5. Nonobstructing bilateral nephrolithiasis. 6. Moderate right colonic diverticulosis. 7. Aortic Atherosclerosis (ICD10-I70.0). Electronically Signed   By: Ilona Sorrel M.D.   On: 02/06/2021 16:47    Scheduled Meds: . sodium chloride   Intravenous Once  . atorvastatin  80 mg Per Tube Daily  . carvedilol  6.25 mg Per NG tube BID WC  . chlorhexidine  15 mL Mouth Rinse BID  . dorzolamide-timolol  1 drop Both Eyes BID  . ferrous sulfate  300 mg Per Tube QODAY  . free water  200 mL Per Tube QID  . furosemide  60 mg Intravenous Daily  . heparin injection (subcutaneous)  5,000 Units Subcutaneous Q8H  . hydrALAZINE  50 mg Per Tube Q8H  . insulin aspart  0-9 Units Subcutaneous Q4H  . insulin glargine  5 Units Subcutaneous BID  . lactulose  20 g Per Tube BID  . mouth rinse  15 mL Mouth Rinse q12n4p  . nystatin  5 mL Mouth/Throat QID  . pantoprazole sodium  40 mg Per Tube Daily   Continuous Infusions: . feeding supplement (NEPRO CARB STEADY) 1,000 mL (02/07/21 0514)    LOS: 4 days   Kerney Elbe, DO [facility] PAGER is on [place]  If 7PM-7AM, please contact night-coverage [email or web address]

## 2021-02-08 DIAGNOSIS — U071 COVID-19: Secondary | ICD-10-CM | POA: Diagnosis not present

## 2021-02-08 DIAGNOSIS — G9341 Metabolic encephalopathy: Secondary | ICD-10-CM | POA: Diagnosis not present

## 2021-02-08 DIAGNOSIS — N184 Chronic kidney disease, stage 4 (severe): Secondary | ICD-10-CM | POA: Diagnosis not present

## 2021-02-08 DIAGNOSIS — Z8673 Personal history of transient ischemic attack (TIA), and cerebral infarction without residual deficits: Secondary | ICD-10-CM | POA: Diagnosis not present

## 2021-02-08 LAB — TYPE AND SCREEN
ABO/RH(D): O POS
Antibody Screen: NEGATIVE
Unit division: 0

## 2021-02-08 LAB — CBC WITH DIFFERENTIAL/PLATELET
Abs Immature Granulocytes: 0.19 10*3/uL — ABNORMAL HIGH (ref 0.00–0.07)
Basophils Absolute: 0.1 10*3/uL (ref 0.0–0.1)
Basophils Relative: 1 %
Eosinophils Absolute: 0.1 10*3/uL (ref 0.0–0.5)
Eosinophils Relative: 3 %
HCT: 25.7 % — ABNORMAL LOW (ref 36.0–46.0)
Hemoglobin: 8.1 g/dL — ABNORMAL LOW (ref 12.0–15.0)
Immature Granulocytes: 4 %
Lymphocytes Relative: 12 %
Lymphs Abs: 0.6 10*3/uL — ABNORMAL LOW (ref 0.7–4.0)
MCH: 29 pg (ref 26.0–34.0)
MCHC: 31.5 g/dL (ref 30.0–36.0)
MCV: 92.1 fL (ref 80.0–100.0)
Monocytes Absolute: 0.5 10*3/uL (ref 0.1–1.0)
Monocytes Relative: 11 %
Neutro Abs: 3.3 10*3/uL (ref 1.7–7.7)
Neutrophils Relative %: 69 %
Platelets: 121 10*3/uL — ABNORMAL LOW (ref 150–400)
RBC: 2.79 MIL/uL — ABNORMAL LOW (ref 3.87–5.11)
RDW: 16 % — ABNORMAL HIGH (ref 11.5–15.5)
WBC: 4.8 10*3/uL (ref 4.0–10.5)
nRBC: 0 % (ref 0.0–0.2)

## 2021-02-08 LAB — COMPREHENSIVE METABOLIC PANEL
ALT: 93 U/L — ABNORMAL HIGH (ref 0–44)
AST: 77 U/L — ABNORMAL HIGH (ref 15–41)
Albumin: 2.4 g/dL — ABNORMAL LOW (ref 3.5–5.0)
Alkaline Phosphatase: 246 U/L — ABNORMAL HIGH (ref 38–126)
Anion gap: 10 (ref 5–15)
BUN: 84 mg/dL — ABNORMAL HIGH (ref 8–23)
CO2: 26 mmol/L (ref 22–32)
Calcium: 9 mg/dL (ref 8.9–10.3)
Chloride: 100 mmol/L (ref 98–111)
Creatinine, Ser: 2.82 mg/dL — ABNORMAL HIGH (ref 0.44–1.00)
GFR, Estimated: 17 mL/min — ABNORMAL LOW (ref >60)
Glucose, Bld: 135 mg/dL — ABNORMAL HIGH (ref 70–99)
Potassium: 3.9 mmol/L (ref 3.5–5.1)
Sodium: 136 mmol/L (ref 135–145)
Total Bilirubin: 0.6 mg/dL (ref 0.3–1.2)
Total Protein: 6.8 g/dL (ref 6.5–8.1)

## 2021-02-08 LAB — GLUCOSE, CAPILLARY
Glucose-Capillary: 119 mg/dL — ABNORMAL HIGH (ref 70–99)
Glucose-Capillary: 119 mg/dL — ABNORMAL HIGH (ref 70–99)
Glucose-Capillary: 138 mg/dL — ABNORMAL HIGH (ref 70–99)
Glucose-Capillary: 151 mg/dL — ABNORMAL HIGH (ref 70–99)
Glucose-Capillary: 153 mg/dL — ABNORMAL HIGH (ref 70–99)
Glucose-Capillary: 209 mg/dL — ABNORMAL HIGH (ref 70–99)

## 2021-02-08 LAB — BPAM RBC
Blood Product Expiration Date: 202203242359
ISSUE DATE / TIME: 202202251427
Unit Type and Rh: 5100

## 2021-02-08 LAB — MAGNESIUM: Magnesium: 2.2 mg/dL (ref 1.7–2.4)

## 2021-02-08 LAB — PHOSPHORUS: Phosphorus: 4.3 mg/dL (ref 2.5–4.6)

## 2021-02-08 NOTE — Progress Notes (Addendum)
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  MRI brain negative for acute finding.  Hospital course complicated by AKI and dysphagia.  Nephrology did not feel patient is a candidate for HD and recommended DNR and possible hospice. She pulled out NGT twice but replaced for the third time.  SLP upgraded to dysphagia 1 diet.  Dietitian adjusted tube feeds to 18 hours regimen and encourage p.o. intake with the hope to avoid PEG tube but patient's mental status remain poor still dietitian has adjusted tube feedings to 24-hour feeds.  She also have progressive renal failure with azotemia and possible uremia but Cr has stabilized. Reportedly separated from spouse about a year ago. Sister trying to get HCPOA done here and this was done today with Chaplain as patient was aware of self, place and time and designated her Sister.  Will need to discuss further goals of care with the patient and the patient's sister given her continued poor p.o. intake and sister feels that she is taking in more with her at bedside and with the nursing staff.  The sister has elected to get a PEG tube so that she can discharge from the hospital but does not want this done and does not want to give verbal consent until she is talked to the rest of her family and patient sister wants to be at the hospital when this is happening and the only time that this can be done when she is off of work is next Wednesday 2021-03-09.  Renal Fxn is relatively stable and Hemoglobin improved after 1 unit of pRBC.    Assessment & Plan:   Principal Problem:   Acute metabolic encephalopathy Active Problems:   Insulin dependent type 2 diabetes mellitus (HCC)   Hypertension    History of stroke   CKD (chronic kidney disease), stage IV ([place])   COVID-19 virus infection   Hypernatremia  Acute metabolic encephalopathy/delirium in patient with history of dementia -Per sister, able to walk with walker and converse at baseline but nowhere close to baseline and may be at a new baseline.  She now have significant azotemia concerning for uremia.  TSH, B12, EEG, CT head and MRI brain negative for acute finding. Neurology recommended treating treatable causes.  -Reorientation and delirium precautions. -Repeat ammonia slightly elevated.  Start lactulose 20 g twice daily given Ammonia Level of 44 -Check RPR and non-reactive -Continue to Monitor closely as she waxes and wanes and she was more somnolent   Oropharyngeal dysphagia-removed NGT x2, and replaced for the third time -Upgraded to dysphagia 1 diet but not able to take p.o. due to deteriorating mental status. -Resume continuous tube feed again given that her po Intake remains poor -I am not sure if PEG tube can reverse the course but patient is sister is insistent that she get some nutrition and did not want to make her palliative care so she elected for PEG tube placement.  CT scan done of the abdomen pelvis today and her anatomy is amenable to getting a PEG tube.  PEG tube to be placed however patient sister wants to be at the hospital when this is done and this cannot be done till next Wednesday given that she is now the  sole caregiver of her sister.  PEG tube is to be placed on 2021/02/19 with consent given on Monday, 02/10/2021 -Palliative Care involved closely but patient's sister does not want any more palliative discussions  Nonoliguric AKI on CKD-4:  -Progressive azotemia and possible uremia. -BUN/Cr the last week or so has been trending up very slightly; BUN/Cr went from 85/2.66 -> 90/2.77 -> 100/2.80 -> 84/2.82 and relatively stable with a GFR is ranging from 17-21 the last week or so and today has been 17 -Renal  ultrasound without acute finding. -Not a candidate for HD per nephrology -Was initiated IV Lasix 60 mg twice daily and now reduced to 60 mg Daily  -I spoke with Dr. Jonnie Finner of nephrology who feels that there is really not much else to offer as they will not going to do anything with her elevated BUN/creatinine but recommends to cut back on the Lasix as appropriate  Pancytopenia: Leukopenia improved and thrombocytopenia stable. FOBT negative. Anemia panel consistent with ACD.  Due to malnutrition? -Continue with ferrous sulfate 300 mg per tube every other day -She was typed and Screened and transfused 1 unit -Patient's WBC is now 4.8, Hgb/Hct is now 8.1/25.7, and Platelet Cont improved from 117 -> 121 -Continue to transfuse for hemoglobin less than 7 -Continue to Monitor Carefully   Anasarca secondary to hypoalbuminemia Acute right upper extremity swelling: RUE negative for DVT.  CT suggests anasarca. -IV Lasix as above and was getting IV 60 mg q12h but will change to 60 mg daily to p.o. 40 at discharge -Optimize nutrition and nutritionist to change to tube feedings continuously now -Patient is +103.5 mL now  -May give some Albumin but after discussion with nephrology they recommend against it -Recommend elevation and TED hose now and TED hose not placed   Hypothermia: Unclear cause of this but resolved.   COVID-19 viral infection: Tested positive on 01/02/2021.  Received remdesivir x3 doses. -No indication for airborne isolation now that she is out of the window now   Hypernatremia/Hyperkalemia: Resolved and is now slightly hypokalemic  Essential HTN: SBP elevated and was 163/78 -Continue carvedilol 6.25 mg twice daily per tube. and hydralazine 50 mg per tube every 8 -As needed hydralazine with parameters. -Continue to monitor blood pressures per protocol and blood pressure was elevated yesterday at 171/64 and todays reading as above   Chronic Diastolic CHF:  -No  cardiopulmonary symptoms but anasarca. -IV Lasix as above and is now on 60 mg daily -As above -Monitor fluid and respiratory status closely; as above  History of CVA in 09/2020 with residual left hemiparesis and expressive aphasia:  -CT head and MRI brain without acute finding. -Continue Plavix, Lipitor via tube -PT has recommended SNF and will get TOC to assist with placement  Acute urinary retention -Resolved. -Monitor urinary output.  Goals of care: Multiple comorbidities as above.  Continued to have Progressive decline.  Poor prognosis.  However, still full code which will pose more harm than benefit.  DNR/DNI recommended by multiple experts including palliative medicine in the past but patient's sister, Ms. Cheek strongly insists on full code and full scope of care. My colleague had discussed patient's progressive decline with Ms. cheek at bedside and I have called her over the phone daily.  [name] is trying to obtain [facility] POA and this has now been done.  [name] states Ms. Gasaway and Mr.Monical were "separated" about a year ago when she went into nursing home.  Ms. Erxleben lived with Ms. Cheek  when she was out of nursing home since then.   Palliative Care reconsulted for [place] Discussion and Notified Ethics team about current situation.  We continued to currently recommend the patient's CODE STATUS be changed to DNR given that her work measures would be somewhat futile given the patient's state of health and current comorbidities.  The patient's mental status continues to wax and wane and she is expressed to the medical team that she would not want tube feedings and is tired however she continues to have very poor p.o. intake.  Currently the situation is very difficult and despite the current findings artificial feeding/PEG tube will not provide any meaningful recovery she may end up with a PEG tube regardless now that her sister is healthcare power of attorney.  Sister has elected for PEG tube  placement and this will be done in the middle of next week  Abnormal LFTs, slowly trending up -Mild.  AST is 48, ALT 69 but have been relatively elevated since the beginning of her admission; now AST 77and ALT 9384 and will continue monitor and trend -Continue to monitor and trend and repeat CMP in a.m. -If necessary will obtain a right upper quadrant ultrasound and acute hepatitis panel but they have been intermittently elevated since the beginning of her admission on 01/02/2021 so we will watch for now  Overweight Body mass index is 29.21 kg/m. Nutrition Problem: Increased nutrient needs Etiology: acute illness (COVID-19 infection) Signs/Symptoms: estimated needs Interventions: Magic cup,Ensure Enlive (each supplement provides 350kcal and 20 grams of protein),Tube feeding  Pressure Injury Pressure Injury Sacrum Mid red open skin (Active)     Location: Sacrum  Location Orientation: Mid  Staging:   Wound Description (Comments): red open skin  Present on Admission: Yes   DVT prophylaxis: Heparin 5,000 units sq q8h; may hold if hemoglobin continues to drop further after blood has been transfused Code Status: FULL CODE  Family Communication: No family present at bedside but spoke to Carloyn Jaeger via Telephone Disposition Plan: SNF placement if her oral intake picks up and her tube feedings via he Cortrak have  been removed and PEG is placed; May need LTAC   Status is: Inpatient  Remains inpatient appropriate because:Unsafe d/c plan, IV treatments appropriate due to intensity of illness or inability to take PO and Inpatient level of care appropriate due to severity of illness   Dispo:  Patient From: [place]  Planned Disposition: [place]  Expected discharge date:02/13/2021  Medically stable for discharge:  No     Consultants:   Nephrology  Neurology  Palliative Care Medicine  Interventional Radiology  Discussed with Ethics Team Dr.  Emeterio Reeve    Procedures: NG Placement   Antimicrobials:  Anti-infectives (From admission, onward)   Start     Dose/Rate Route Frequency Ordered Stop   01/07/21 0800  ceFEPIme (MAXIPIME) 2 g in sodium chloride 0.9 % 100 mL IVPB  Status:  Discontinued        2 g 200 mL/hr over 30 Minutes Intravenous Daily 01/06/21 1159 01/08/21 1257   01/05/21 1500  vancomycin (VANCOCIN) IVPB 1000 mg/200 mL premix  Status:  Discontinued        1,000 mg 200 mL/hr over 60 Minutes Intravenous Every 24 hours 01/05/21 1357 01/05/21 1414   01/05/21 1500  vancomycin (VANCOCIN) IVPB 1000 mg/200 mL premix  Status:  Discontinued        1,000 mg 200 mL/hr over 60 Minutes Intravenous Every 48 hours 01/05/21 1414 01/06/21  1146   01/05/21 1445  ceFEPIme (MAXIPIME) 2 g in sodium chloride 0.9 % 100 mL IVPB  Status:  Discontinued        2 g 200 mL/hr over 30 Minutes Intravenous Daily 01/05/21 1351 01/06/21 1146   01/03/21 1000  remdesivir 100 mg in sodium chloride 0.9 % 100 mL IVPB  Status:  Discontinued       "Followed by" Linked Group Details   100 mg 200 mL/hr over 30 Minutes Intravenous Daily 01/02/21 2257 01/04/21 1330   01/02/21 2300  remdesivir 200 mg in sodium chloride 0.9% 250 mL IVPB       "Followed by" Linked Group Details   200 mg 580 mL/hr over 30 Minutes Intravenous Once 01/02/21 2257 01/03/21 0147        Subjective: Seen and examined at bedside and she is more awake yesterday and then she was today and today she is respond but does grunts and moans.  Nursing states that she is more awake this morning.  No chest pain, lightheadedness or dizziness.  Legs remain swollen.  Plan still for PEG tube placement next week and I am hopeful discharge to SNF.  No other concerns or complaints at this time and renal function is relatively stable with a GFR being 17 last 4 days.  No other concerns or plans at this time  Objective: Vitals:   02/07/21 1651 02/07/21 2006 02/08/21 0430 02/08/21 0907  BP: (!)  160/56 (!) 171/65 (!) 169/69 (!) 163/78  Pulse: 69 64 64 64  Resp: 18  20 20   Temp: 97.8 F (36.6 C) 97.6 F (36.4 C) 98.2 F (36.8 C) 97.7 F (36.5 C)  TempSrc: Oral Oral Oral Axillary  SpO2: 100% 99% 99% 97%  Weight:   84.9 kg   Height:        Intake/Output Summary (Last 24 hours) at 02/08/2021 1020 Last data filed at 02/08/2021 9379 Gross per 24 hour  Intake 1691.75 ml  Output 1900 ml  Net -208.25 ml   Filed Weights   02/05/21 0600 02/06/21 0221 02/08/21 0430  Weight: 85.5 kg 85 kg 84.9 kg   Examination: Physical Exam:  Constitutional: The patient is a thin chronically ill-appearing overweight African-American female who is not as awake today and only grunts and moans when answering questions; continues to have a Cortrak in place Eyes: Lids are normal  ENMT: External Ears, Nose appear normal. Grossly normal hearing. Cortrak in Right Nare Neck: Appears normal, supple, no cervical masses, normal ROM, no appreciable thyromegaly; No appreciable JVD Respiratory: Diminished to auscultation bilaterally, no wheezing, rales, rhonchi or crackles. Normal respiratory effort and patient is not tachypenic. No accessory muscle use. Unlabored breathing  Cardiovascular: RRR, no murmurs / rubs / gallops. S1 and S2 auscultated. 1-2+ LE Edema in the LE and RUE also swollen Abdomen: Soft, non-tender, Distended 2/2 body habitus. Bowel sounds positive.  GU: Deferred. Musculoskeletal: No clubbing / cyanosis of digits/nails. No joint deformity upper and lower extremities.  Skin: No rashes, lesions, ulcers. No induration; Warm and dry.  Neurologic: She is more somnolent and just grunts today and does not really want to interact.  Continues have some dysarthria Psychiatric: Impaired judgment and insight.  She is somnolent and drowsy today.  Normal mood and appropriate affect.   Data Reviewed: I have personally reviewed following labs and imaging studies  CBC: Recent Labs  Lab 02/05/21 0426  02/06/21 0415 02/07/21 0404 02/07/21 1222 02/08/21 0336  WBC 3.3* 3.5* 3.5* 4.1 4.8  NEUTROABS  --  2.2 2.4 2.8 3.3  HGB 7.6* 7.4* 7.1* 7.3* 8.1*  HCT 25.1* 24.0* 24.0* 23.6* 25.7*  MCV 95.4 94.9 96.0 93.7 92.1  PLT 140* 130* 120* 117* 921*   Basic Metabolic Panel: Recent Labs  Lab 02/04/21 0418 02/05/21 0426 02/06/21 0415 02/07/21 0404 02/08/21 0336  NA 136 134* 135 137 136  K 4.0 4.0 4.0 4.2 3.9  CL 103 100 100 101 100  CO2 22 22 24 26 26   GLUCOSE 168* 175* 132* 178* 135*  BUN 80* 85* 90* 100* 84*  CREATININE 2.73* 2.66* 2.77* 2.80* 2.82*  CALCIUM 8.9 8.9 8.9 8.9 9.0  MG 2.3 2.4 2.2 2.1 2.2  PHOS 3.8 4.1 4.1 4.2 4.3   GFR: Estimated Creatinine Clearance: 19 mL/min (A) (by C-G formula based on SCr of 2.82 mg/dL (H)). Liver Function Tests: Recent Labs  Lab 02/04/21 0418 02/05/21 0426 02/06/21 0415 02/07/21 0404 02/08/21 0336  AST  --   --  48* 69* 77*  ALT  --   --  69* 84* 93*  ALKPHOS  --   --  213* 249* 246*  BILITOT  --   --  0.8 0.5 0.6  PROT  --   --  6.5 6.4* 6.8  ALBUMIN 2.4* 2.4* 2.4* 2.4* 2.4*   No results for input(s): LIPASE, AMYLASE in the last 168 hours. Recent Labs  Lab 02/03/21 2326  AMMONIA 44*   Coagulation Profile: No results for input(s): INR, PROTIME in the last 168 hours. Cardiac Enzymes: Recent Labs  Lab 02/02/21 0437  CKTOTAL 41   BNP (last 3 results) No results for input(s): PROBNP in the last 8760 hours. HbA1C: No results for input(s): HGBA1C in the last 72 hours. CBG: Recent Labs  Lab 02/07/21 1643 02/07/21 2003 02/08/21 0007 02/08/21 0427 02/08/21 0734  GLUCAP 200* 141* 151* 119* 119*   Lipid Profile: No results for input(s): CHOL, HDL, LDLCALC, TRIG, CHOLHDL, LDLDIRECT in the last 72 hours. Thyroid Function Tests: No results for input(s): TSH, T4TOTAL, FREET4, T3FREE, THYROIDAB in the last 72 hours. Anemia Panel: No results for input(s): VITAMINB12, FOLATE, FERRITIN, TIBC, IRON, RETICCTPCT in the last 72  hours. Sepsis Labs: No results for input(s): PROCALCITON, LATICACIDVEN in the last 168 hours.  No results found for this or any previous visit (from the past 240 hour(s)).   RN Pressure Injury Documentation: Pressure Injury Sacrum Mid red open skin (Active)     Location: Sacrum  Location Orientation: Mid  Staging:   Wound Description (Comments): red open skin  Present on Admission: Yes     Estimated body mass index is 29.32 kg/m as calculated from the following:   Height as of this encounter: 5\' 7"  (1.702 m).   Weight as of this encounter: 84.9 kg.  Malnutrition Type:  Nutrition Problem: Increased nutrient needs Etiology: acute illness (COVID-19 infection)   Malnutrition Characteristics:  Signs/Symptoms: estimated needs   Nutrition Interventions:  Interventions: Magic cup,Ensure Enlive (each supplement provides 350kcal and 20 grams of protein),Tube feeding   Radiology Studies: CT ABDOMEN WO CONTRAST  Result Date: 02/06/2021 CLINICAL DATA:  Inpatient. Assess anatomy for possible gastrostomy tube placement. Chronic kidney disease. EXAM: CT ABDOMEN WITHOUT CONTRAST TECHNIQUE: Multidetector CT imaging of the abdomen was performed following the standard protocol without IV contrast. COMPARISON:  01/26/2021 abdominal radiograph. 12/01/2014 CT abdomen/pelvis. FINDINGS: Lower chest: Small to moderate right and small left dependent pleural effusions with mild to moderate passive atelectasis at the dependent lung bases bilaterally. Cardiomegaly. ICD lead seen in the  right atrium and terminating in the interventricular septum. Hepatobiliary: Normal liver with no liver mass. Normal gallbladder with no radiopaque cholelithiasis. No biliary ductal dilatation. Pancreas: Normal, with no mass or duct dilation. Spleen: Normal size. No mass. Adrenals/Urinary Tract: Normal adrenals. Nonobstructing 2 mm interpolar right renal and 1 mm upper polar left renal stones. No hydronephrosis. Simple  bilateral renal cysts, largest 4.0 cm in the upper left kidney. Stomach/Bowel: Weighted enteric tube terminates in gastric antrum. Stomach is collapsed and grossly normal. Normal caliber small and large bowel loops. Moderate right colonic diverticulosis with no large bowel wall thickening or significant pericolonic fat stranding. Vascular/Lymphatic: Atherosclerotic nonaneurysmal abdominal aorta. No pathologically enlarged lymph nodes in the abdomen. Other: No pneumoperitoneum, ascites or focal fluid collection. Extensive anasarca. Musculoskeletal: No aggressive appearing focal osseous lesions. Mild thoracolumbar spondylosis. IMPRESSION: 1. Well-positioned enteric tube with tip in the gastric antrum. Stomach is collapsed and grossly normal. 2. Extensive anasarca. 3. Small to moderate right and small left dependent pleural effusions with bibasilar passive atelectasis. 4. Cardiomegaly. 5. Nonobstructing bilateral nephrolithiasis. 6. Moderate right colonic diverticulosis. 7. Aortic Atherosclerosis (ICD10-I70.0). Electronically Signed   By: Ilona Sorrel M.D.   On: 02/06/2021 16:47    Scheduled Meds: . atorvastatin  80 mg Per Tube Daily  . carvedilol  6.25 mg Per NG tube BID WC  . chlorhexidine  15 mL Mouth Rinse BID  . dorzolamide-timolol  1 drop Both Eyes BID  . ferrous sulfate  300 mg Per Tube QODAY  . free water  200 mL Per Tube QID  . furosemide  60 mg Intravenous Daily  . heparin injection (subcutaneous)  5,000 Units Subcutaneous Q8H  . hydrALAZINE  50 mg Per Tube Q8H  . insulin aspart  0-9 Units Subcutaneous Q4H  . insulin glargine  5 Units Subcutaneous BID  . lactulose  20 g Per Tube BID  . mouth rinse  15 mL Mouth Rinse q12n4p  . nystatin  5 mL Mouth/Throat QID  . pantoprazole sodium  40 mg Per Tube Daily   Continuous Infusions: . feeding supplement (NEPRO CARB STEADY) 1,000 mL (02/08/21 0503)    LOS: 27 days   Kerney Elbe, DO [facility] PAGER is on [place]  If 7PM-7AM,  please contact night-coverage [email or web address]

## 2021-02-08 NOTE — Plan of Care (Signed)
  Problem: Pain Managment: Goal: General experience of comfort will improve Outcome: Progressing   Problem: Safety: Goal: Ability to remain free from injury will improve Outcome: Progressing   Problem: Skin Integrity: Goal: Risk for impaired skin integrity will decrease Outcome: Progressing   

## 2021-02-09 ENCOUNTER — Inpatient Hospital Stay (HOSPITAL_COMMUNITY): Payer: Medicare HMO

## 2021-02-09 DIAGNOSIS — N184 Chronic kidney disease, stage 4 (severe): Secondary | ICD-10-CM | POA: Diagnosis not present

## 2021-02-09 DIAGNOSIS — R945 Abnormal results of liver function studies: Secondary | ICD-10-CM

## 2021-02-09 DIAGNOSIS — Z8673 Personal history of transient ischemic attack (TIA), and cerebral infarction without residual deficits: Secondary | ICD-10-CM | POA: Diagnosis not present

## 2021-02-09 DIAGNOSIS — U071 COVID-19: Secondary | ICD-10-CM | POA: Diagnosis not present

## 2021-02-09 DIAGNOSIS — G9341 Metabolic encephalopathy: Secondary | ICD-10-CM | POA: Diagnosis not present

## 2021-02-09 LAB — COMPREHENSIVE METABOLIC PANEL
ALT: 110 U/L — ABNORMAL HIGH (ref 0–44)
AST: 89 U/L — ABNORMAL HIGH (ref 15–41)
Albumin: 2.4 g/dL — ABNORMAL LOW (ref 3.5–5.0)
Alkaline Phosphatase: 261 U/L — ABNORMAL HIGH (ref 38–126)
Anion gap: 10 (ref 5–15)
BUN: 105 mg/dL — ABNORMAL HIGH (ref 8–23)
CO2: 28 mmol/L (ref 22–32)
Calcium: 9.2 mg/dL (ref 8.9–10.3)
Chloride: 99 mmol/L (ref 98–111)
Creatinine, Ser: 2.87 mg/dL — ABNORMAL HIGH (ref 0.44–1.00)
GFR, Estimated: 16 mL/min — ABNORMAL LOW (ref >60)
Glucose, Bld: 158 mg/dL — ABNORMAL HIGH (ref 70–99)
Potassium: 4 mmol/L (ref 3.5–5.1)
Sodium: 137 mmol/L (ref 135–145)
Total Bilirubin: 0.7 mg/dL (ref 0.3–1.2)
Total Protein: 6.6 g/dL (ref 6.5–8.1)

## 2021-02-09 LAB — GLUCOSE, CAPILLARY
Glucose-Capillary: 110 mg/dL — ABNORMAL HIGH (ref 70–99)
Glucose-Capillary: 113 mg/dL — ABNORMAL HIGH (ref 70–99)
Glucose-Capillary: 128 mg/dL — ABNORMAL HIGH (ref 70–99)
Glucose-Capillary: 137 mg/dL — ABNORMAL HIGH (ref 70–99)
Glucose-Capillary: 149 mg/dL — ABNORMAL HIGH (ref 70–99)
Glucose-Capillary: 159 mg/dL — ABNORMAL HIGH (ref 70–99)
Glucose-Capillary: 162 mg/dL — ABNORMAL HIGH (ref 70–99)

## 2021-02-09 LAB — CBC WITH DIFFERENTIAL/PLATELET
Abs Immature Granulocytes: 0.24 10*3/uL — ABNORMAL HIGH (ref 0.00–0.07)
Basophils Absolute: 0.1 10*3/uL (ref 0.0–0.1)
Basophils Relative: 2 %
Eosinophils Absolute: 0.2 10*3/uL (ref 0.0–0.5)
Eosinophils Relative: 4 %
HCT: 26.8 % — ABNORMAL LOW (ref 36.0–46.0)
Hemoglobin: 8.3 g/dL — ABNORMAL LOW (ref 12.0–15.0)
Immature Granulocytes: 5 %
Lymphocytes Relative: 10 %
Lymphs Abs: 0.5 10*3/uL — ABNORMAL LOW (ref 0.7–4.0)
MCH: 29.1 pg (ref 26.0–34.0)
MCHC: 31 g/dL (ref 30.0–36.0)
MCV: 94 fL (ref 80.0–100.0)
Monocytes Absolute: 0.5 10*3/uL (ref 0.1–1.0)
Monocytes Relative: 10 %
Neutro Abs: 3.6 10*3/uL (ref 1.7–7.7)
Neutrophils Relative %: 69 %
Platelets: 110 10*3/uL — ABNORMAL LOW (ref 150–400)
RBC: 2.85 MIL/uL — ABNORMAL LOW (ref 3.87–5.11)
RDW: 15.9 % — ABNORMAL HIGH (ref 11.5–15.5)
WBC: 5.2 10*3/uL (ref 4.0–10.5)
nRBC: 0 % (ref 0.0–0.2)

## 2021-02-09 LAB — PHOSPHORUS: Phosphorus: 4.7 mg/dL — ABNORMAL HIGH (ref 2.5–4.6)

## 2021-02-09 LAB — MAGNESIUM: Magnesium: 2.4 mg/dL (ref 1.7–2.4)

## 2021-02-09 IMAGING — DX DG CHEST 1V PORT
1 series · 1 of 1 positions shown · non-contrast
Comparison: [DATE]

CLINICAL DATA: The hip knee.

EXAM:
PORTABLE CHEST 1 VIEW

[chest ap]
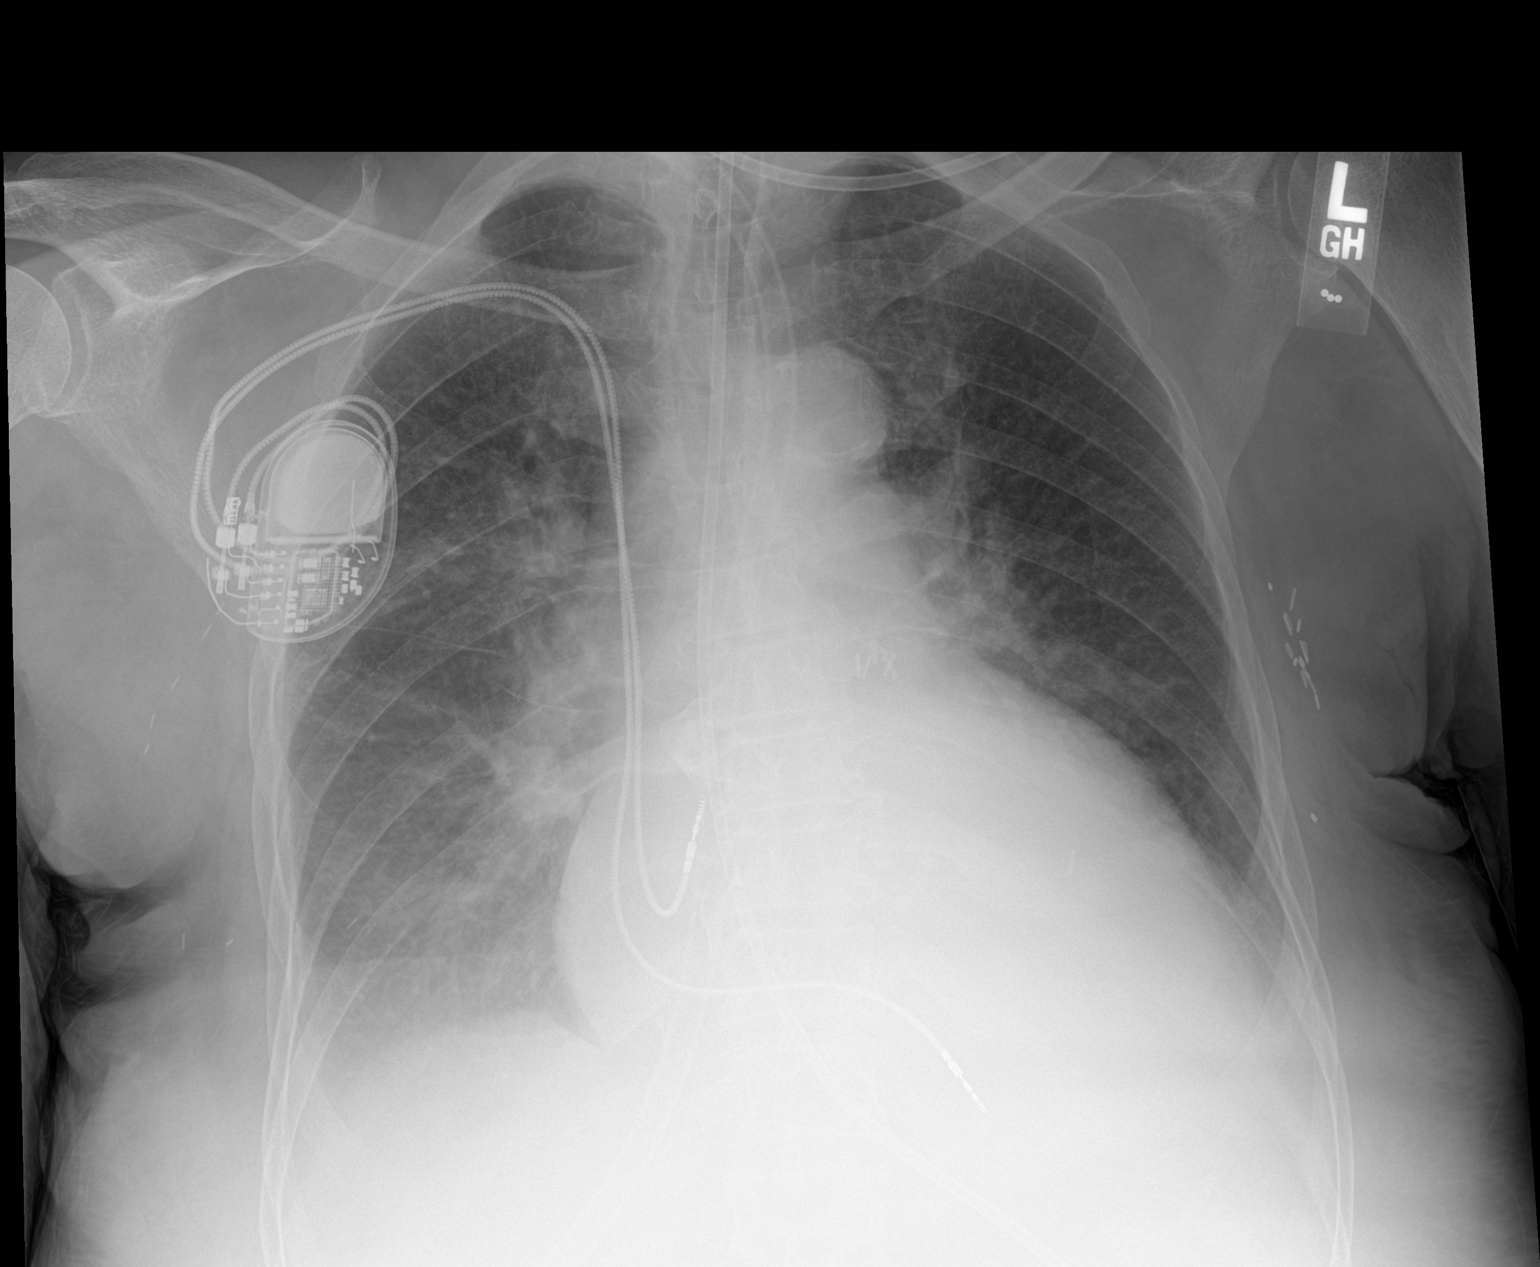

[1 of 1 positions shown; findings below may reference images not displayed]

FINDINGS: Feeding catheter transverses the thorax. Dual lead cardiac pacemaker
in stable position.

Enlarged cardiac silhouette. Calcific atherosclerotic disease of the
aorta.

Bilateral pleural effusions.  Mild interstitial pulmonary edema.

Chest wall postsurgical changes.
IMPRESSION: 1. Enlarged cardiac silhouette with mild interstitial pulmonary
edema and bilateral pleural effusions.

## 2021-02-09 IMAGING — US US ABDOMEN LIMITED RUQ/ASCITES
1 series · 14 of 25 positions shown · non-contrast
Comparison: None.

CLINICAL DATA: Abnormal LFTs

EXAM:
ULTRASOUND ABDOMEN LIMITED RIGHT UPPER QUADRANT

[Series 1: us abdomen limited ruq/ascites · 14 of 33 slices shown]
[im 1/33]
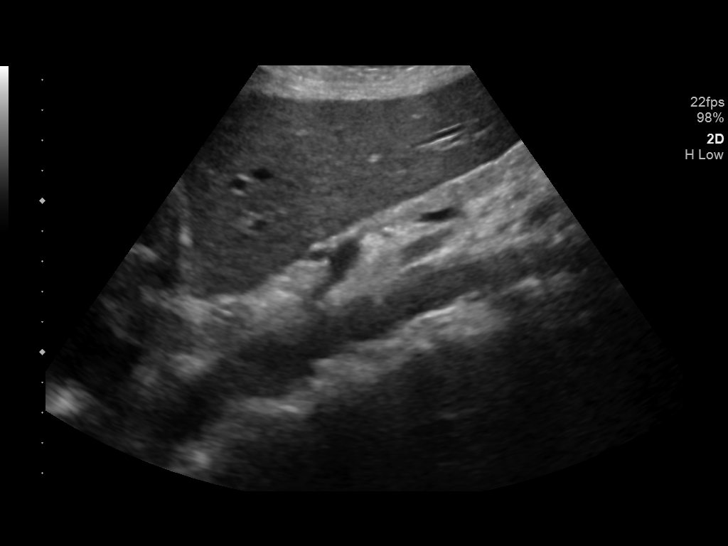
[im 3/33]
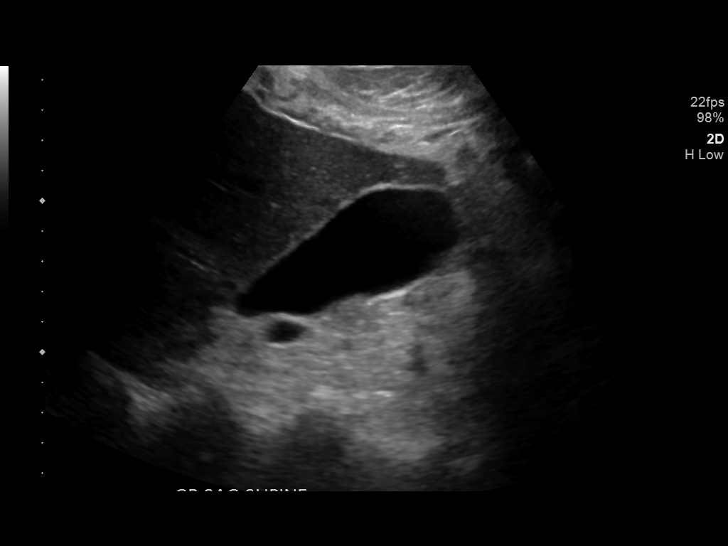
[im 6/33]
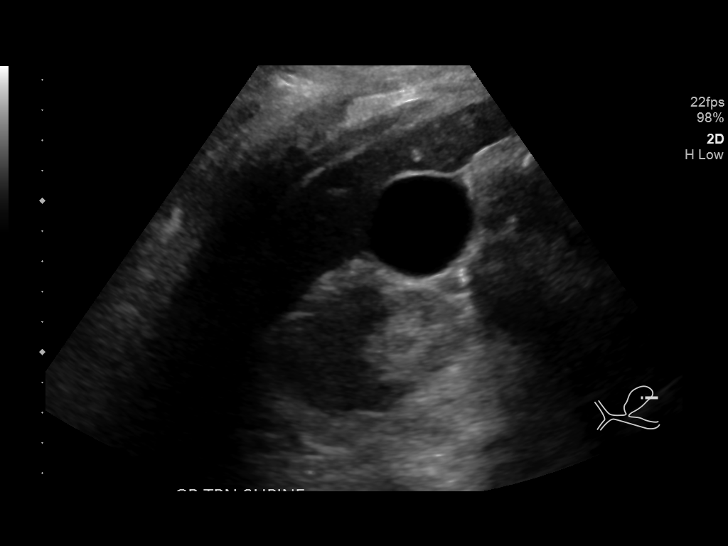
[im 9/33]
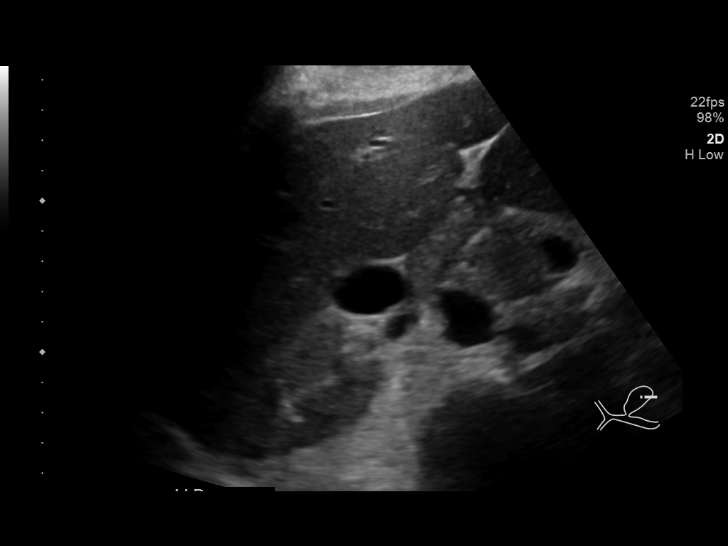
[im 11/33]
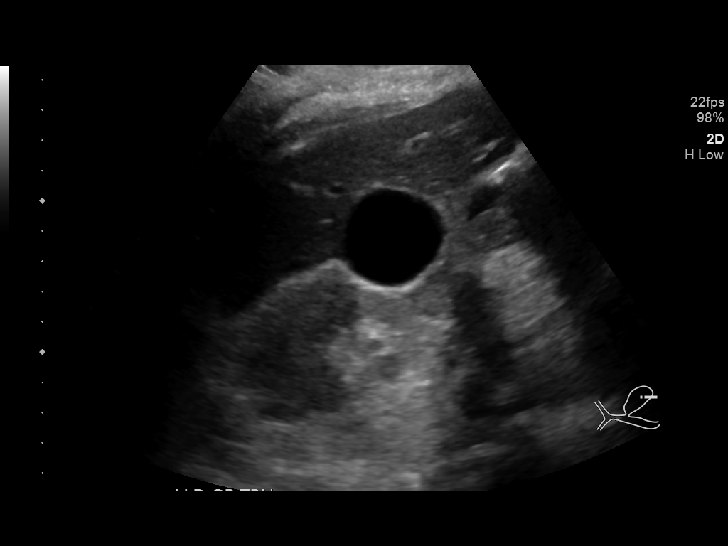
[im 13/33]
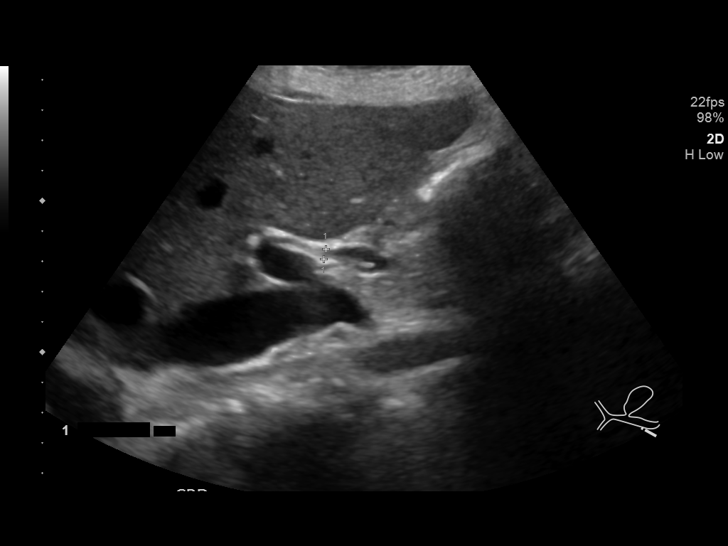
[im 15/33]
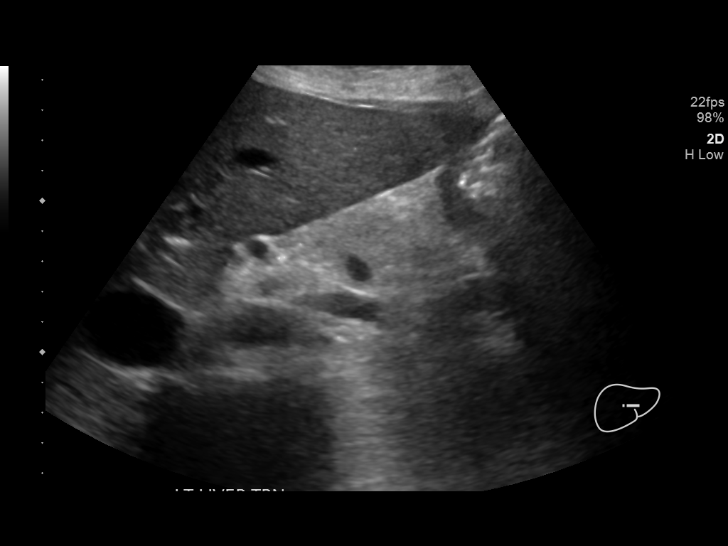
[im 18/33]
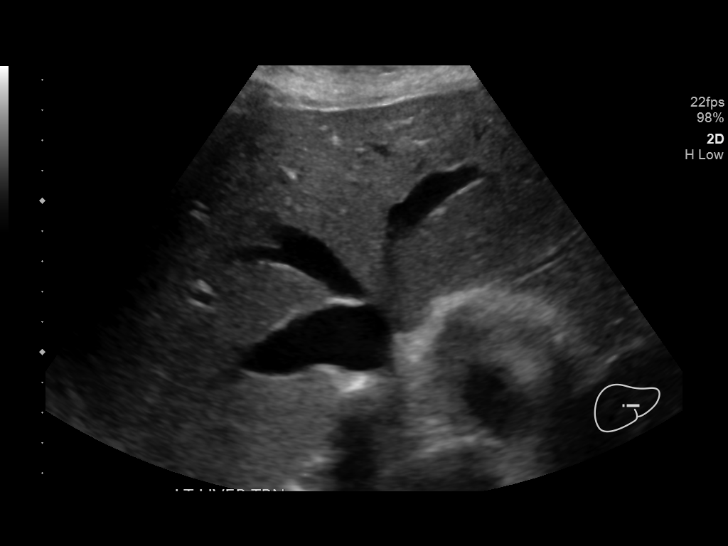
[im 21/33]
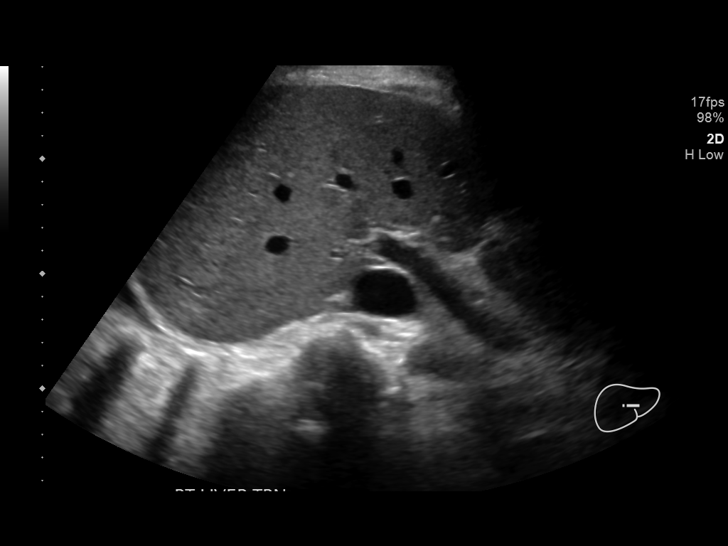
[im 22/33]
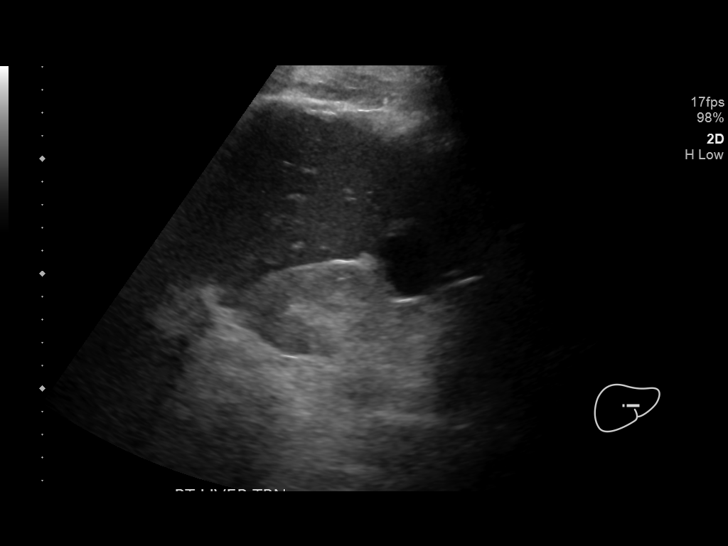
[im 25/33]
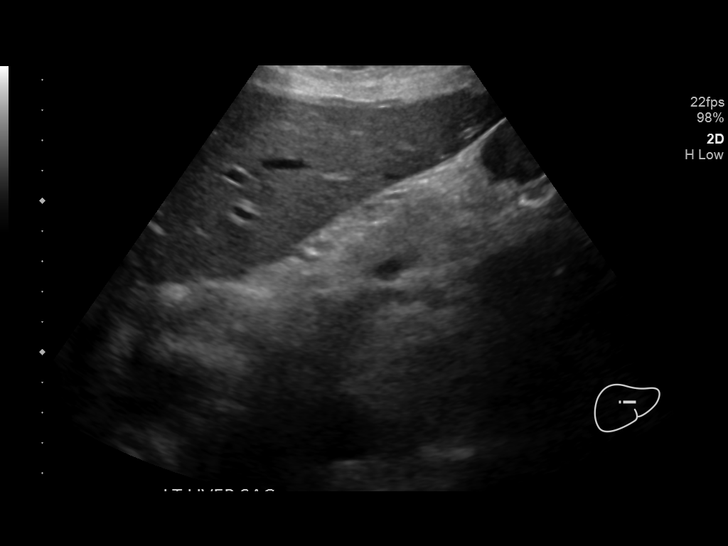
[im 27/33]
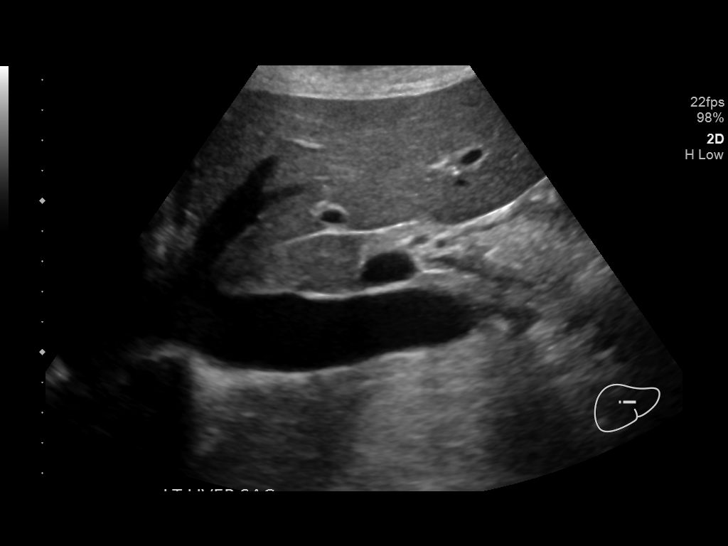
[im 30/33]
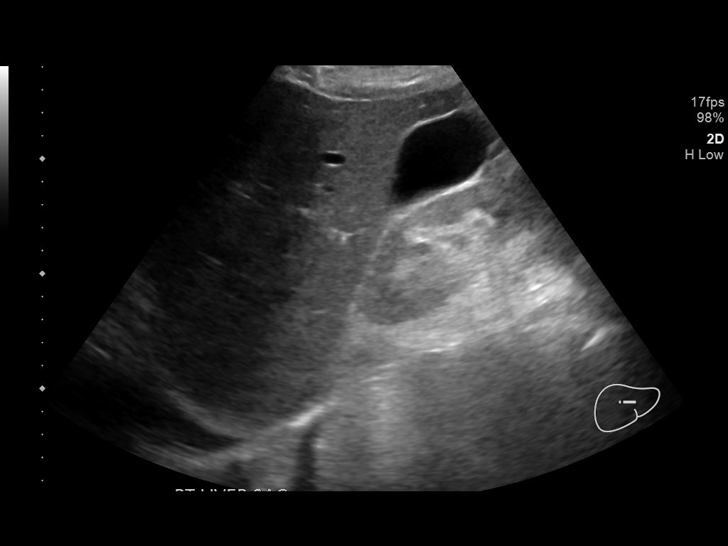
[im 33/33]
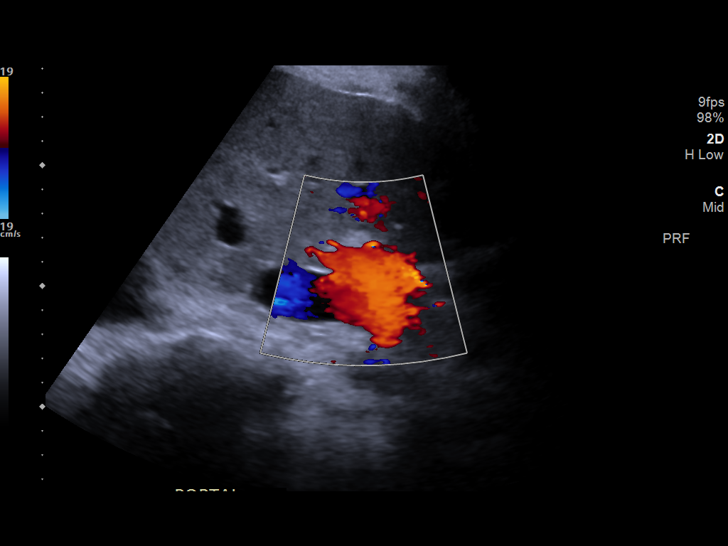

[14 of 25 positions shown; findings below may reference images not displayed]

FINDINGS: Gallbladder:

No gallstones or wall thickening visualized. No sonographic Murphy
sign noted by sonographer.

Common bile duct:

Diameter: 3 mm

Liver:

No focal lesion identified. Within normal limits in parenchymal
echogenicity. Portal vein is patent on color Doppler imaging with
normal direction of blood flow towards the liver.

Other: Right-sided pleural effusion.
IMPRESSION: 1. Unremarkable ultrasound of the right upper quadrant.
2. Right-sided pleural effusion.

## 2021-02-09 MED ORDER — IPRATROPIUM BROMIDE 0.02 % IN SOLN
0.5000 mg | Freq: Three times a day (TID) | RESPIRATORY_TRACT | Status: DC
  Administered 2021-02-10 – 2021-02-12 (×7): 0.5 mg via RESPIRATORY_TRACT
  Filled 2021-02-09 (×7): qty 2.5

## 2021-02-09 MED ORDER — LEVALBUTEROL HCL 0.63 MG/3ML IN NEBU
0.6300 mg | INHALATION_SOLUTION | Freq: Three times a day (TID) | RESPIRATORY_TRACT | Status: DC
  Administered 2021-02-10 – 2021-02-12 (×7): 0.63 mg via RESPIRATORY_TRACT
  Filled 2021-02-09 (×7): qty 3

## 2021-02-09 MED ORDER — IPRATROPIUM BROMIDE 0.02 % IN SOLN
0.5000 mg | Freq: Four times a day (QID) | RESPIRATORY_TRACT | Status: DC
  Administered 2021-02-09: 0.5 mg via RESPIRATORY_TRACT
  Filled 2021-02-09: qty 2.5

## 2021-02-09 MED ORDER — GUAIFENESIN 100 MG/5ML PO SOLN
5.0000 mL | ORAL | Status: DC | PRN
  Administered 2021-02-09 – 2021-02-11 (×2): 100 mg
  Filled 2021-02-09 (×3): qty 10

## 2021-02-09 MED ORDER — FUROSEMIDE 10 MG/ML IJ SOLN
60.0000 mg | Freq: Once | INTRAMUSCULAR | Status: AC
  Administered 2021-02-09: 60 mg via INTRAVENOUS
  Filled 2021-02-09: qty 6

## 2021-02-09 MED ORDER — HYDRALAZINE HCL 20 MG/ML IJ SOLN
10.0000 mg | Freq: Four times a day (QID) | INTRAMUSCULAR | Status: DC | PRN
  Administered 2021-02-11: 10 mg via INTRAVENOUS
  Administered 2021-02-11 – 2021-02-16 (×5): 20 mg via INTRAVENOUS
  Filled 2021-02-09 (×8): qty 1

## 2021-02-09 MED ORDER — LEVALBUTEROL HCL 0.63 MG/3ML IN NEBU
0.6300 mg | INHALATION_SOLUTION | Freq: Four times a day (QID) | RESPIRATORY_TRACT | Status: DC
  Administered 2021-02-09: 0.63 mg via RESPIRATORY_TRACT
  Filled 2021-02-09: qty 3

## 2021-02-09 NOTE — Progress Notes (Signed)
Patient's temp is 95 rectally, Dr.Sheik informed, awaiting orders, report given to oncoming nurse. To assume care and continue same.

## 2021-02-09 NOTE — Plan of Care (Signed)
  Problem: Clinical Measurements: Goal: Ability to maintain clinical measurements within normal limits will improve Outcome: Progressing Goal: Will remain free from infection Outcome: Progressing   Problem: Activity: Goal: Risk for activity intolerance will decrease Outcome: Progressing   Problem: Pain Managment: Goal: General experience of comfort will improve Outcome: Progressing   Problem: Safety: Goal: Ability to remain free from injury will improve Outcome: Progressing   Problem: Skin Integrity: Goal: Risk for impaired skin integrity will decrease Outcome: Progressing   

## 2021-02-09 NOTE — Progress Notes (Signed)
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  MRI brain negative for acute finding.  Hospital course complicated by AKI and dysphagia.  Nephrology did not feel patient is a candidate for HD and recommended DNR and possible hospice. She pulled out NGT twice but replaced for the third time.  SLP upgraded to dysphagia 1 diet.  Dietitian adjusted tube feeds to 18 hours regimen and encourage p.o. intake with the hope to avoid PEG tube but patient's mental status remain poor still dietitian has adjusted tube feedings to 24-hour feeds.  She also have progressive renal failure with azotemia and possible uremia but Cr has stabilized. Reportedly separated from spouse about a year ago. Sister trying to get HCPOA done here and this was done today with Chaplain as patient was aware of self, place and time and designated her Sister.  Will need to discuss further goals of care with the patient and the patient's sister given her continued poor p.o. intake and sister feels that she is taking in more with her at bedside and with the nursing staff.  The sister has elected to get a PEG tube so that she can discharge from the hospital but does not want this done and does not want to give verbal consent until she is talked to the rest of her family and patient sister wants to be at the hospital when this is happening and the only time that this can be done when she is off of work is next Wednesday 02/24/2021.  Renal Fxn is relatively stable and Hemoglobin improved after 1 unit of pRBC.  Overnight she became hypothermic so bear hugger was reinitiated and temperature is now improving on repeat.  Unclear etiology why she became hypothermic again.  We will  hold her Lasix given her continued climb and creatinine.  We will also obtain a right upper quadrant ultrasound given her abnormal LFTs.  Plan is still for PEG tube next week.   Assessment & Plan:   Principal Problem:   Acute metabolic encephalopathy Active Problems:   Insulin dependent type 2 diabetes mellitus (HCC)   Hypertension   History of stroke   CKD (chronic kidney disease), stage IV ([place])   COVID-19 virus infection   Hypernatremia  Acute metabolic encephalopathy/delirium in patient with history of dementia -Per sister, able to walk with walker and converse at baseline but nowhere close to baseline and may be at a new baseline.  She now have significant azotemia concerning for uremia.  TSH, B12, EEG, CT head and MRI brain negative for acute finding. Neurology recommended treating treatable causes.  -Reorientation and delirium precautions. -Repeat ammonia slightly elevated.  Start lactulose 20 g twice daily given Ammonia Level of 44 -Check RPR and non-reactive -Continue to Monitor closely as she waxes and wanes and she is awake and mumbles sometimes but then becomes somnolent at other times.  Oropharyngeal dysphagia-removed NGT x2, and replaced for the third time -Upgraded to dysphagia 1 diet but not able to take p.o. due to deteriorating mental status. -Resume continuous tube feed again given that her po Intake remains poor -I am not sure if PEG tube can reverse the course but patient is sister is insistent that she get some nutrition and  did not want to make her palliative care so she elected for PEG tube placement.  CT scan done of the abdomen pelvis today and her anatomy is amenable to getting a PEG tube.  PEG tube to be placed however patient sister wants to be at the hospital when this is done and this cannot be done till next Wednesday given that she is now the sole caregiver of her sister.  PEG tube is to be placed on February 25, 2021 with consent given on Monday,  02/10/2021 -Palliative Care involved closely but patient's sister does not want any more palliative discussions  Nonoliguric AKI on CKD-4:  -Progressive azotemia and possible uremia. -BUN/Cr the last week or so has been trending up very slightly; BUN/Cr went from 85/2.66 -> 90/2.77 -> 100/2.80 -> 84/2.82 is continue to trend up and today it is 105/2.87 but has a relatively stable GFR is ranging from 17-21 the last week or so and today it was 16 -Renal ultrasound without acute finding. -Not a candidate for HD per nephrology -Was initiated IV Lasix 60 mg twice daily and now reduced to 60 mg Daily but will hold today's dose given her continued to climb. -I spoke with Dr. Jonnie Finner of nephrology who feels that there is really not much else to offer as they will not going to do anything with her elevated BUN/creatinine but recommends to cut back on the Lasix as appropriate  Pancytopenia: Leukopenia improved and thrombocytopenia stable. FOBT negative. Anemia panel consistent with ACD.  Due to malnutrition? -Continue with ferrous sulfate 300 mg per tube every other day -She was typed and Screened and transfused 1 unit -Patient's WBC is now 5.2, Hgb/Hct is now 8.3/26.8, and Platelet Cont improved from 117 -> 121 and today it is now 110 -Continue to transfuse for hemoglobin less than 7 -Continue to Monitor Carefully   Anasarca secondary to hypoalbuminemia Acute right upper extremity swelling: RUE negative for DVT.  CT suggests anasarca. -IV Lasix as above and was getting IV 60 mg q12h but will change to 60 mg daily and hold today's dose and consider changing to to p.o. 40 at discharge -Optimize nutrition and nutritionist to change to tube feedings continuously now -Patient is + 2.960 L now  -May give some Albumin but after discussion with nephrology they recommend against it -Recommend elevation and TED hose now and TED hose not placed   Hypothermia: Unclear cause; became hypothermic overnight had  temperature 94.1 -Now back on the Bair Hugger  COVID-19 viral infection: Tested positive on 01/02/2021.  Received remdesivir x3 doses. -No indication for airborne isolation now that she is out of the window now   Hypernatremia/Hyperkalemia: Resolved as sodium is 137 and potassium is 4.0 -Continue to monitor and trend and repeat CMP in a.m.  Essential HTN:  -Continue carvedilol 6.25 mg twice daily per tube. and hydralazine 50 mg per tube every 8 -As needed hydralazine with parameters. -Continue to monitor blood pressures per protocol and blood pressure was elevated this morning  Chronic Diastolic CHF:  -No cardiopulmonary symptoms but anasarca. -IV Lasix as above and with IV 60 mg daily but will hold today's dose given her continued climb and renal function -As above -Monitor fluid and respiratory status closely; as above  History of CVA in 09/2020 with residual left hemiparesis and expressive aphasia:  -CT head and MRI brain without acute finding. -Continue Plavix, Lipitor via tube -PT has recommended SNF and will get TOC to assist with placement  Acute urinary retention -Resolved. -Monitor  urinary output.  Goals of care: Multiple comorbidities as above.  Continued to have Progressive decline.  Poor prognosis.  However, still full code which will pose more harm than benefit.  DNR/DNI recommended by multiple experts including palliative medicine in the past but patient's sister, Desiree Pollard strongly insists on full code and full scope of care. My colleague had discussed patient's progressive decline with Desiree Pollard at bedside and I have called her over the phone daily.  Desiree Pollard is trying to obtain [facility] PO[facility] and this has now been done.  Desiree Pollard states Desiree Pollard and Desiree Pollard were "separated" about a year ago when she went into nursing home.  Ms. Donnan lived with Desiree Pollard when she was out of nursing home since then.   Palliative Care reconsulted for [facility] and Notified Ethics team  about current situation.  We continued to currently recommend the patient's CODE STATUS be changed to DNR given that her work measures would be somewhat futile given the patient's state of health and current comorbidities.  The patient's mental status continues to wax and wane and she is expressed to the medical team that she would not want tube feedings and is tired however she continues to have very poor p.o. intake.  Currently the situation is very difficult and despite the current findings artificial feeding/PEG tube will not provide any meaningful recovery she may end up with a PEG tube regardless now that her sister is healthcare power of attorney.  Sister has elected for PEG tube placement and this will be done in the middle of next week  Abnormal LFTs, slowly trending up -Mild.  AST is 48, ALT 69 but have been relatively elevated since the beginning of her admission; now AST is 89 and ALT 110 and will continue monitor and trend -Continue to monitor and trend and repeat CMP in a.m. -We will obtain an acute hepatitis panel and right upper quadrant ultrasound -Continue to monitor and trend hepatic function panel and repeat CMP in a.m.  Overweight Body mass index is 29.21 kg/m. Nutrition Problem: Increased nutrient needs Etiology: acute illness (COVID-19 infection) Signs/Symptoms: estimated needs Interventions: Magic cup,Ensure Enlive (each supplement provides 350kcal and 20 grams of protein),Tube feeding  Pressure Injury Pressure Injury Sacrum Mid red open skin (Active)     Location: Sacrum  Location Orientation: Mid  Staging:   Wound Description (Comments): red open skin  Present on Admission: Yes   DVT prophylaxis: Heparin 5,000 units sq q8h Code Status: FULL CODE  Family Communication: No family present at bedside but spoke to Desiree Pollard via Telephone Disposition Plan: SNF placement if her oral intake picks up and her tube feedings via he Cortrak have  been removed and PEG  is placed; May need LTAC   Status is: Inpatient  Remains inpatient appropriate because:Unsafe d/c plan, IV treatments appropriate due to intensity of illness or inability to take PO and Inpatient level of care appropriate due to severity of illness   Dispo:  Patient From: [facility]  Planned Disposition: [facility]  Expected discharge date:02/13/2021  Medically stable for discharge:  No     Consultants:   Nephrology  Neurology  Palliative Care Medicine  Interventional Radiology  Discussed with Ethics Team Dr. Emeterio Reeve    Procedures: NG Placement   Antimicrobials:  Anti-infectives (From admission, onward)   Start     Dose/Rate Route Frequency Ordered Stop   01/07/21 0800  ceFEPIme (MAXIPIME) 2 g in sodium chloride 0.9 %  100 mL IVPB  Status:  Discontinued        2 g 200 mL/hr over 30 Minutes Intravenous Daily 01/06/21 1159 01/08/21 1257   01/05/21 1500  vancomycin (VANCOCIN) IVPB 1000 mg/200 mL premix  Status:  Discontinued        1,000 mg 200 mL/hr over 60 Minutes Intravenous Every 24 hours 01/05/21 1357 01/05/21 1414   01/05/21 1500  vancomycin (VANCOCIN) IVPB 1000 mg/200 mL premix  Status:  Discontinued        1,000 mg 200 mL/hr over 60 Minutes Intravenous Every 48 hours 01/05/21 1414 01/06/21 1146   01/05/21 1445  ceFEPIme (MAXIPIME) 2 g in sodium chloride 0.9 % 100 mL IVPB  Status:  Discontinued        2 g 200 mL/hr over 30 Minutes Intravenous Daily 01/05/21 1351 01/06/21 1146   01/03/21 1000  remdesivir 100 mg in sodium chloride 0.9 % 100 mL IVPB  Status:  Discontinued       "Followed by" Linked Group Details   100 mg 200 mL/hr over 30 Minutes Intravenous Daily 01/02/21 2257 01/04/21 1330   01/02/21 2300  remdesivir 200 mg in sodium chloride 0.9% 250 mL IVPB       "Followed by" Linked Group Details   200 mg 580 mL/hr over 30 Minutes Intravenous Once 01/02/21 2257 01/03/21 0147        Subjective: Seen and examined at  bedside and she is more awake yesterday and then she was today and today she is respond but does grunts and moans.  Nursing states that she is more awake this morning.  No chest pain, lightheadedness or dizziness.  Legs remain swollen.  Plan still for PEG tube placement next week and I am hopeful discharge to SNF.  No other concerns or complaints at this time and renal function is relatively stable with a GFR being 17 last 4 days.  No other concerns or plans at this time  Objective: Vitals:   02/09/21 0653 02/09/21 0700 02/09/21 1013 02/09/21 1100  BP:  (!) 145/58 (!) 188/68 (!) 148/63  Pulse:  60 66 69  Resp:   16   Temp: (!) 95 F (35 C) (!) 94.1 F (34.5 C) (!) 96.3 F (35.7 C)   TempSrc: Rectal  Rectal   SpO2:  98% 97%   Weight:      Height:        Intake/Output Summary (Last 24 hours) at 02/09/2021 1256 Last data filed at 02/09/2021 0600 Gross per 24 hour  Intake 3406.67 ml  Output 700 ml  Net 2706.67 ml   Filed Weights   02/06/21 0221 02/08/21 0430 02/09/21 0500  Weight: 85 kg 84.9 kg 85.8 kg   Examination: Physical Exam:  Constitutional: Patient is a overweight chronically ill-appearing African-American female who continues to be awake but does not provide a subjective history and just really grunts when you speak with her Eyes: Lids and conjunctivae normal, sclerae anicteric  ENMT: External Ears, Nose appear normal. Grossly normal hearing. Cortrak in Right Nare Neck: Appears normal, supple, no cervical masses, normal ROM, no appreciable thyromegaly; no JVD Respiratory: Diminished to auscultation bilaterally, no wheezing, rales, rhonchi or crackles. Normal respiratory effort and patient is not tachypenic. No accessory muscle use.  Unlabored breathing Cardiovascular: RRR, no murmurs / rubs / gallops. S1 and S2 auscultated.  Is 1+ lower extremity edema and now she has tidal volume Abdomen: Soft, non-tender, distended secondary body habitus. Bowel sounds positive.  GU:  Deferred. Musculoskeletal:  No clubbing / cyanosis of digits/nails. No joint deformity upper and lower extremities.  Skin: No rashes, lesions, ulcers or limited skin evaluation. No induration; Warm and dry.  Neurologic: Continues to be a little somnolent and grunts today in when asked she does not really respond appropriately given her aphasia Psychiatric: Impaired judgment and insight.  She is awake now but not really oriented but is alert and grunts when she tries to answer`. Normal mood and appropriate affect.   Data Reviewed: I have personally reviewed following labs and imaging studies  CBC: Recent Labs  Lab 02/06/21 0415 02/07/21 0404 02/07/21 1222 02/08/21 0336 02/09/21 0409  WBC 3.5* 3.5* 4.1 4.8 5.2  NEUTROABS 2.2 2.4 2.8 3.3 3.6  HGB 7.4* 7.1* 7.3* 8.1* 8.3*  HCT 24.0* 24.0* 23.6* 25.7* 26.8*  MCV 94.9 96.0 93.7 92.1 94.0  PLT 130* 120* 117* 121* 314*   Basic Metabolic Panel: Recent Labs  Lab 02/05/21 0426 02/06/21 0415 02/07/21 0404 02/08/21 0336 02/09/21 0409  NA 134* 135 137 136 137  K 4.0 4.0 4.2 3.9 4.0  CL 100 100 101 100 99  CO2 22 24 26 26 28   GLUCOSE 175* 132* 178* 135* 158*  BUN 85* 90* 100* 84* 105*  CREATININE 2.66* 2.77* 2.80* 2.82* 2.87*  CALCIUM 8.9 8.9 8.9 9.0 9.2  MG 2.4 2.2 2.1 2.2 2.4  PHOS 4.1 4.1 4.2 4.3 4.7*   GFR: Estimated Creatinine Clearance: 18.8 mL/min (A) (by C-G formula based on SCr of 2.87 mg/dL (H)). Liver Function Tests: Recent Labs  Lab 02/05/21 0426 02/06/21 0415 02/07/21 0404 02/08/21 0336 02/09/21 0409  AST  --  48* 69* 77* 89*  ALT  --  69* 84* 93* 110*  ALKPHOS  --  213* 249* 246* 261*  BILITOT  --  0.8 0.5 0.6 0.7  PROT  --  6.5 6.4* 6.8 6.6  ALBUMIN 2.4* 2.4* 2.4* 2.4* 2.4*   No results for input(s): LIPASE, AMYLASE in the last 168 hours. Recent Labs  Lab 02/03/21 2326  AMMONIA 44*   Coagulation Profile: No results for input(s): INR, PROTIME in the last 168 hours. Cardiac Enzymes: No results for  input(s): CKTOTAL, CKMB, CKMBINDEX, TROPONINI in the last 168 hours. BNP (last 3 results) No results for input(s): PROBNP in the last 8760 hours. HbA1C: No results for input(s): HGBA1C in the last 72 hours. CBG: Recent Labs  Lab 02/08/21 1700 02/08/21 1958 02/09/21 0008 02/09/21 0417 02/09/21 0748  GLUCAP 153* 138* 149* 159* 137*   Lipid Profile: No results for input(s): CHOL, HDL, LDLCALC, TRIG, CHOLHDL, LDLDIRECT in the last 72 hours. Thyroid Function Tests: No results for input(s): TSH, T4TOTAL, FREET4, T3FREE, THYROIDAB in the last 72 hours. Anemia Panel: No results for input(s): VITAMINB12, FOLATE, FERRITIN, TIBC, IRON, RETICCTPCT in the last 72 hours. Sepsis Labs: No results for input(s): PROCALCITON, LATICACIDVEN in the last 168 hours.  No results found for this or any previous visit (from the past 240 hour(s)).   RN Pressure Injury Documentation: Pressure Injury Sacrum Mid red open skin (Active)     Location: Sacrum  Location Orientation: Mid  Staging:   Wound Description (Comments): red open skin  Present on Admission: Yes     Estimated body mass index is 29.63 kg/m as calculated from the following:   Height as of this encounter: 5\' 7"  (1.702 m).   Weight as of this encounter: 85.8 kg.  Malnutrition Type:  Nutrition Problem: Increased nutrient needs Etiology: acute illness (COVID-19 infection)  Malnutrition Characteristics:  Signs/Symptoms: estimated needs   Nutrition Interventions:  Interventions: Magic cup,Ensure Enlive (each supplement provides 350kcal and 20 grams of protein),Tube feeding   Radiology Studies: No results found.  Scheduled Meds: . atorvastatin  80 mg Per Tube Daily  . carvedilol  6.25 mg Per NG tube BID WC  . chlorhexidine  15 mL Mouth Rinse BID  . dorzolamide-timolol  1 drop Both Eyes BID  . ferrous sulfate  300 mg Per Tube QODAY  . free water  200 mL Per Tube QID  . heparin injection (subcutaneous)  5,000 Units  Subcutaneous Q8H  . hydrALAZINE  50 mg Per Tube Q8H  . insulin aspart  0-9 Units Subcutaneous Q4H  . insulin glargine  5 Units Subcutaneous BID  . lactulose  20 g Per Tube BID  . mouth rinse  15 mL Mouth Rinse q12n4p  . nystatin  5 mL Mouth/Throat QID  . pantoprazole sodium  40 mg Per Tube Daily   Continuous Infusions: . feeding supplement (NEPRO CARB STEADY) 50 mL/hr at 02/08/21 1501    LOS: 33 days   Kerney Elbe, DO [facility] PAGER is on [place]  If 7PM-7AM, please contact night-coverage [email or web address]

## 2021-02-10 ENCOUNTER — Encounter (HOSPITAL_COMMUNITY): Payer: Self-pay | Admitting: Internal Medicine

## 2021-02-10 DIAGNOSIS — Z8673 Personal history of transient ischemic attack (TIA), and cerebral infarction without residual deficits: Secondary | ICD-10-CM | POA: Diagnosis not present

## 2021-02-10 DIAGNOSIS — N184 Chronic kidney disease, stage 4 (severe): Secondary | ICD-10-CM | POA: Diagnosis not present

## 2021-02-10 DIAGNOSIS — U071 COVID-19: Secondary | ICD-10-CM | POA: Diagnosis not present

## 2021-02-10 DIAGNOSIS — G9341 Metabolic encephalopathy: Secondary | ICD-10-CM | POA: Diagnosis not present

## 2021-02-10 LAB — CBC WITH DIFFERENTIAL/PLATELET
Abs Immature Granulocytes: 0.27 10*3/uL — ABNORMAL HIGH (ref 0.00–0.07)
Basophils Absolute: 0.1 10*3/uL (ref 0.0–0.1)
Basophils Relative: 2 %
Eosinophils Absolute: 0.2 10*3/uL (ref 0.0–0.5)
Eosinophils Relative: 3 %
HCT: 25.5 % — ABNORMAL LOW (ref 36.0–46.0)
Hemoglobin: 8.2 g/dL — ABNORMAL LOW (ref 12.0–15.0)
Immature Granulocytes: 5 %
Lymphocytes Relative: 13 %
Lymphs Abs: 0.7 10*3/uL (ref 0.7–4.0)
MCH: 29.3 pg (ref 26.0–34.0)
MCHC: 32.2 g/dL (ref 30.0–36.0)
MCV: 91.1 fL (ref 80.0–100.0)
Monocytes Absolute: 0.5 10*3/uL (ref 0.1–1.0)
Monocytes Relative: 9 %
Neutro Abs: 3.8 10*3/uL (ref 1.7–7.7)
Neutrophils Relative %: 68 %
Platelets: 98 10*3/uL — ABNORMAL LOW (ref 150–400)
RBC: 2.8 MIL/uL — ABNORMAL LOW (ref 3.87–5.11)
RDW: 15.9 % — ABNORMAL HIGH (ref 11.5–15.5)
WBC: 5.5 10*3/uL (ref 4.0–10.5)
nRBC: 0 % (ref 0.0–0.2)

## 2021-02-10 LAB — GLUCOSE, CAPILLARY
Glucose-Capillary: 142 mg/dL — ABNORMAL HIGH (ref 70–99)
Glucose-Capillary: 146 mg/dL — ABNORMAL HIGH (ref 70–99)
Glucose-Capillary: 152 mg/dL — ABNORMAL HIGH (ref 70–99)
Glucose-Capillary: 156 mg/dL — ABNORMAL HIGH (ref 70–99)
Glucose-Capillary: 163 mg/dL — ABNORMAL HIGH (ref 70–99)
Glucose-Capillary: 169 mg/dL — ABNORMAL HIGH (ref 70–99)
Glucose-Capillary: 180 mg/dL — ABNORMAL HIGH (ref 70–99)

## 2021-02-10 LAB — COMPREHENSIVE METABOLIC PANEL
ALT: 103 U/L — ABNORMAL HIGH (ref 0–44)
AST: 78 U/L — ABNORMAL HIGH (ref 15–41)
Albumin: 2.4 g/dL — ABNORMAL LOW (ref 3.5–5.0)
Alkaline Phosphatase: 235 U/L — ABNORMAL HIGH (ref 38–126)
Anion gap: 12 (ref 5–15)
BUN: 105 mg/dL — ABNORMAL HIGH (ref 8–23)
CO2: 24 mmol/L (ref 22–32)
Calcium: 9.1 mg/dL (ref 8.9–10.3)
Chloride: 99 mmol/L (ref 98–111)
Creatinine, Ser: 2.85 mg/dL — ABNORMAL HIGH (ref 0.44–1.00)
GFR, Estimated: 17 mL/min — ABNORMAL LOW (ref >60)
Glucose, Bld: 151 mg/dL — ABNORMAL HIGH (ref 70–99)
Potassium: 3.9 mmol/L (ref 3.5–5.1)
Sodium: 135 mmol/L (ref 135–145)
Total Bilirubin: 0.8 mg/dL (ref 0.3–1.2)
Total Protein: 6.5 g/dL (ref 6.5–8.1)

## 2021-02-10 LAB — PHOSPHORUS: Phosphorus: 4.4 mg/dL (ref 2.5–4.6)

## 2021-02-10 LAB — MAGNESIUM: Magnesium: 2.3 mg/dL (ref 1.7–2.4)

## 2021-02-10 MED ORDER — FUROSEMIDE 10 MG/ML IJ SOLN
60.0000 mg | Freq: Once | INTRAMUSCULAR | Status: AC
  Administered 2021-02-10: 60 mg via INTRAVENOUS
  Filled 2021-02-10: qty 6

## 2021-02-10 NOTE — Progress Notes (Signed)
   02/10/21 1322  Assess: MEWS Score  Temp (!) 95 F (35 C)  BP (!) 141/62  Pulse Rate 61  Resp (!) 30  SpO2 98 %  O2 Device Room Air  Assess: MEWS Score  MEWS Temp 1  MEWS Systolic 0  MEWS Pulse 0  MEWS RR 2  MEWS LOC 0  MEWS Score 3  MEWS Score Color Yellow  Assess: if the MEWS score is Yellow or Red  Were vital signs taken at a resting state? Yes  Focused Assessment Change from prior assessment (see assessment flowsheet)  Early Detection of Sepsis Score *See Row Information* Medium  MEWS guidelines implemented *See Row Information* Yes  Treat  MEWS Interventions Escalated (See documentation below) (start bear hugger)  Take Vital Signs  Increase Vital Sign Frequency  Yellow: Q 2hr X 2 then Q 4hr X 2, if remains yellow, continue Q 4hrs  Escalate  MEWS: Escalate Yellow: discuss with charge nurse/RN and consider discussing with provider and RRT  Notify: Charge Nurse/RN  Name of Charge Nurse/RN Notified charge nurse  Date Charge Nurse/RN Notified 02/10/21  Time Charge Nurse/RN Notified 1345  Notify: Provider  Provider [facility]  Date Provider Notified 02/10/21  Time Provider Notified 1345  Notification Type [place]  Notification Reason Change in status  Provider response See new orders  Date of Provider Response 02/10/21  Time of Provider Response 1346  Document  Patient Outcome Other (Comment) (bear hugger in place, re check temp)  Progress note created (see row info) Yes

## 2021-02-10 NOTE — Progress Notes (Signed)
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  MRI brain negative for acute finding.  Hospital course complicated by AKI and dysphagia.  Nephrology did not feel patient is a candidate for HD and recommended DNR and possible hospice. She pulled out NGT twice but replaced for the third time.  SLP upgraded to dysphagia 1 diet.  Dietitian adjusted tube feeds to 18 hours regimen and encourage p.o. intake with the hope to avoid PEG tube but patient's mental status remain poor still dietitian has adjusted tube feedings to 24-hour feeds.  She also have progressive renal failure with azotemia and possible uremia but Cr has stabilized. Reportedly separated from spouse about a year ago. Sister trying to get HCPOA done here and this was done today with Chaplain as patient was aware of self, place and time and designated her Sister.  Will need to discuss further goals of care with the patient and the patient's sister given her continued poor p.o. intake and sister feels that she is taking in more with her at bedside and with the nursing staff.  The sister has elected to get a PEG tube so that she can discharge from the hospital but does not want this done and does not want to give verbal consent until she is talked to the rest of her family and patient sister wants to be at the hospital when this is happening and the only time that this can be done when she is off of work is next Wednesday Mar 09, 2021.  Renal Fxn  has trende been relatively stable d up and Hemoglobin improved after 1 unit of pRBC.  Overnight on 2/26-2/27 she became hypothermic so bear hugger was reinitiated and temperature is now improving on repeat.  Unclear etiology why she  became hypothermic again.  LFTs are slowly trending down now and right upper quadrant ultrasound was negative for any for any pathology.  Patient was a little tachypneic yesterday so obtained a chest x-ray done yesterday showed enlarged cardiac silhouette with mild interstitial pulmonary edema and bilateral pleural effusions so she was given a dose of IV Lasix again.  We will continue IV Lasix again today at 60 mg daily.  Plan is still for PEG tube placement Wednesday with consent to be given today.  Assessment & Plan:   Principal Problem:   Acute metabolic encephalopathy Active Problems:   Insulin dependent type 2 diabetes mellitus (HCC)   Hypertension   History of stroke   CKD (chronic kidney disease), stage IV ([place])   COVID-19 virus infection   Hypernatremia  Acute metabolic encephalopathy/delirium in patient with history of dementia -Per sister, able to walk with walker and converse at baseline but nowhere close to baseline and may be at a new baseline.  She now have significant azotemia concerning for uremia.  TSH, B12, EEG, CT head and MRI brain negative for acute finding. Neurology recommended treating treatable causes.  -Reorientation and delirium precautions. -Repeat ammonia slightly elevated.  Start lactulose 20 g twice daily given Ammonia Level of 44 -Check RPR and non-reactive -Continue to Monitor closely as she waxes and wanes and she is awake and mumbles sometimes but then becomes somnolent at other times.  Oropharyngeal dysphagia-removed NGT x2, and replaced for  the third time -Upgraded to dysphagia 1 diet but not able to take p.o. due to deteriorating mental status. -Resume continuous tube feed again given that her po Intake remains poor -I am not sure if PEG tube can reverse the course but patient is sister is insistent that she get some nutrition and did not want to make her palliative care so she elected for PEG tube placement.  CT scan done of the abdomen pelvis today and  her anatomy is amenable to getting a PEG tube.  PEG tube to be placed however patient sister wants to be at the hospital when this is done and this cannot be done till next Wednesday given that she is now the sole caregiver of her sister.  PEG tube is to be placed on March 13, 2021 with consent given on Monday, 02/10/2021 -SLP is following and recommending continuing current plan of care as patient is still tolerating dysphagia 1 with thin liquids but p.o. intake continues to be extremely poor -Palliative Care involved closely but patient's sister does not want any more palliative discussions  Nonoliguric AKI on CKD-4:  -Progressive azotemia and possible uremia. -BUN/Cr the last week or so has been trending up very slightly; BUN/Cr went from 85/2.66 -> 90/2.77 -> 100/2.80 -> 84/2.82 and has continued to climb slowly and trend up and yesterday it is 105/2.87 and today it is 105/2.85 but has a relatively stable GFR is ranging from 17-21 the last week or so and today it was 17 -Renal ultrasound without acute finding. -Not a candidate for HD per nephrology -Was initiated on IV Lasix 60 mg twice daily and dose was reduced to 60 mg daily but it was held yesterday and then given later in the evening after chest x-ray findings -I spoke with Dr. Jonnie Finner of nephrology who feels that there is really not much else to offer as they will not going to do anything with her elevated BUN/creatinine but recommends to cut back on the Lasix as appropriate  Pancytopenia: Leukopenia improved and thrombocytopenia stable. FOBT negative. Anemia panel consistent with ACD.  Due to malnutrition? -Continue with ferrous sulfate 300 mg per tube every other day -She was typed and Screened and transfused 1 unit -Patient's WBC is now 5.5, Hgb/Hct is now 8.2/25.5, and Platelet Cont went from 117 -> 121 -> 110 -> 98 -Continue to transfuse for hemoglobin less than 7; will hold for VT prophylaxis today and reevaluate platelet count in the  morning -Continue to Monitor Carefully   Anasarca secondary to hypoalbuminemia Acute right upper extremity swelling: RUE negative for DVT.  CT suggests anasarca. -IV Lasix as above and was getting IV 60 mg q12h and was changed to 60 mg p.o. daily IV and yesterday's dose was initially held but then given again given her chest x-ray findings -We will likely need to be on p.o. Lasix at discharge now likely 40 mg p.o. daily -Optimize nutrition and nutritionist to change to tube feedings continuously now -Patient is + 1.376 L now  -May give some Albumin but after discussion with nephrology they recommend against it -Recommend elevation and TED hose now and TED hose not placed   Hypothermia: Unclear cause; became hypothermic overnight had temperature 94.1 -Now back on the [facility] yesterday and temperature is improved and now stabilized  COVID-19 viral infection: Tested positive on 01/02/2021.  Received remdesivir x3 doses. -No indication for airborne isolation now that she is out of the window now   Hypernatremia/Hyperkalemia: Resolved as sodium is 135 and  potassium is 3.9 -Continue to monitor and trend and repeat CMP in a.m.  Essential HTN:  -Continue carvedilol 6.25 mg twice daily per tube. and hydralazine 50 mg per tube every 8 -As needed hydralazine with parameters. -Continue to monitor blood pressures per protocol and blood pressure was 2/33  Chronic Diastolic CHF:  -No cardiopulmonary symptoms but anasarca. -IV Lasix as above and is now on IV 60 daily.  Renal function has continued to climb but now appears to be stabilized given her CKD stage IV -As above -Monitor fluid and respiratory status closely; as above  History of CVA in 09/2020 with residual left hemiparesis and expressive aphasia:  -CT head and MRI brain without acute finding. -Continue Plavix, Lipitor via tube -PT has recommended SNF and will get TOC to assist with placement  Acute urinary  retention -Resolved. -Monitor urinary output.  Goals of care: Multiple comorbidities as above.  Continued to have Progressive decline.  Poor prognosis.  However, still full code which will pose more harm than benefit.  DNR/DNI recommended by multiple experts including palliative medicine in the past but patient's sister, Ms. Cheek strongly insists on full code and full scope of care. My colleague had discussed patient's progressive decline with Ms. cheek at bedside and I have called her over the phone daily.  [name] is trying to obtain [facility] POA and this has now been done.  [name] states Ms. Blatz and Mr.Goering were "separated" about a year ago when she went into nursing home.  Ms. Agent lived with Ms. Cheek when she was out of nursing home since then.   Palliative Care reconsulted for [place] Discussion and Notified Ethics team about current situation.  We continued to currently recommend the patient's CODE STATUS be changed to DNR given that her work measures would be somewhat futile given the patient's state of health and current comorbidities.  The patient's mental status continues to wax and wane and she is expressed to the medical team that she would not want tube feedings and is tired however she continues to have very poor p.o. intake.  Currently the situation is very difficult and despite the current findings artificial feeding/PEG tube will not provide any meaningful recovery she may end up with a PEG tube regardless now that her sister is healthcare power of attorney.  Sister has elected for PEG tube placement and this will be done in the middle of next week  Abnormal LFTs, slowly trending up and now stabilized and improving -Mild.  AST is 48, ALT 69 but have been relatively elevated since the beginning of her admission; now AST is 89 yesterday and is improved to 78 today and ALT 110 yesterday and is improved to 103 today and will continue monitor and trend -Continue to monitor and trend and repeat  CMP in a.m. -Right upper quadrant ultrasound was unremarkable acute hepatitis panel is pending -Continue to monitor and trend hepatic function panel and repeat CMP in a.m.  Overweight Body mass index is 29.21 kg/m. Nutrition Problem: Increased nutrient needs Etiology: acute illness (COVID-19 infection) Signs/Symptoms: estimated needs Interventions: Magic cup,Ensure Enlive (each supplement provides 350kcal and 20 grams of protein),Tube feeding  Pressure Injury Pressure Injury Sacrum Mid red open skin (Active)     Location: Sacrum  Location Orientation: Mid  Staging:   Wound Description (Comments): red open skin  Present on Admission: Yes   DVT prophylaxis: Heparin 5,000 units sq q8h Code Status: FULL CODE  Family Communication: No family present at bedside  but spoke to Carloyn Jaeger via Telephone Disposition Plan: SNF placement if her oral intake picks up and her tube feedings via he [place] have  been removed and PEG is placed; May need LTAC   Status is: Inpatient  Remains inpatient appropriate because:Unsafe d/c plan, IV treatments appropriate due to intensity of illness or inability to take PO and Inpatient level of care appropriate due to severity of illness   Dispo:  Patient From:    Planned Disposition: [place]  Expected discharge date:02/13/2021  Medically stable for discharge:  No     Consultants:   Nephrology  Neurology  Palliative Care Medicine  Interventional Radiology  Discussed with Ethics Team Dr. Emeterio Reeve    Procedures: NG Placement   Antimicrobials:  Anti-infectives (From admission, onward)   Start     Dose/Rate Route Frequency Ordered Stop   01/07/21 0800  ceFEPIme (MAXIPIME) 2 g in sodium chloride 0.9 % 100 mL IVPB  Status:  Discontinued        2 g 200 mL/hr over 30 Minutes Intravenous Daily 01/06/21 1159 01/08/21 1257   01/05/21 1500  vancomycin (VANCOCIN) IVPB 1000 mg/200 mL premix  Status:  Discontinued         1,000 mg 200 mL/hr over 60 Minutes Intravenous Every 24 hours 01/05/21 1357 01/05/21 1414   01/05/21 1500  vancomycin (VANCOCIN) IVPB 1000 mg/200 mL premix  Status:  Discontinued        1,000 mg 200 mL/hr over 60 Minutes Intravenous Every 48 hours 01/05/21 1414 01/06/21 1146   01/05/21 1445  ceFEPIme (MAXIPIME) 2 g in sodium chloride 0.9 % 100 mL IVPB  Status:  Discontinued        2 g 200 mL/hr over 30 Minutes Intravenous Daily 01/05/21 1351 01/06/21 1146   01/03/21 1000  remdesivir 100 mg in sodium chloride 0.9 % 100 mL IVPB  Status:  Discontinued       "Followed by" Linked Group Details   100 mg 200 mL/hr over 30 Minutes Intravenous Daily 01/02/21 2257 01/04/21 1330   01/02/21 2300  remdesivir 200 mg in sodium chloride 0.9% 250 mL IVPB       "Followed by" Linked Group Details   200 mg 580 mL/hr over 30 Minutes Intravenous Once 01/02/21 2257 01/03/21 0147        Subjective: Seen and examined at bedside and she is more awake and alert and still continues to grunt when she tries to answer question.  SLP states that she did have a Magic cup today but her appetite remains extremely poor.  Her respiratory status appears improved from yesterday.  We will resume her Lasix IV.  Plan is for the patient's sister to consent for her PEG tube placement for Wednesday.  No other acute events noted overnight.  Objective: Vitals:   02/09/21 2001 02/09/21 2006 02/10/21 0545 02/10/21 0802  BP:  (!) 160/64 (!) 152/58   Pulse:  61 (!) 59   Resp:  (!) 21 (!) 24   Temp:  97.9 F (36.6 C) (!) 97.4 F (36.3 C)   TempSrc:  Axillary Axillary   SpO2: 93% 100% 97% 95%  Weight:      Height:        Intake/Output Summary (Last 24 hours) at 02/10/2021 1203 Last data filed at 02/10/2021 1026 Gross per 24 hour  Intake 1300 ml  Output 2650 ml  Net -1350 ml   Filed Weights   02/06/21 0221 02/08/21 0430 02/09/21 0500  Weight: 85  kg 84.9 kg 85.8 kg   Examination: Physical Exam:  Constitutional: Patient  is an overweight chronically ill-appearing African-American female currently who is more awake but still really unable to provide [place] referred to the history given her current condition.  She is extremely aphasic when you try to speak with her Eyes: Lds and conjunctivae normal, sclerae anicteric  ENMT: External Ears, Nose appear normal. Grossly normal hearing Neck: Appears normal, supple, no cervical masses, normal ROM, no appreciable thyromegaly; no JVD Respiratory: Diminished to auscultation bilaterally with coarse breath sounds at the bases, no wheezing, rales, rhonchi or crackles.  She is slightly tachypneic but has no accessory muscle use.  Not wearing any supplemental oxygen via nasal cannula Cardiovascular: RRR, no murmurs / rubs / gallops. S1 and S2 auscultated.  Continues to have 1-2+ lower extremity and upper extremity edema. Abdomen: Soft, non-tender, distended secondary to body. Bowel sounds positive.  GU: Deferred. Musculoskeletal: No clubbing / cyanosis of digits/nails. No joint deformity upper and lower extremities.  Skin: No rashes, lesions, ulcers on limited skin evaluation. No induration; Warm and dry.  Neurologic: CN 2-12 grossly intact but she remains aphasic and is incomprehensible and grunts mainly. Psychiatric: Impaired judgment and insight.  She is awake and alert this morning.  Normal mood and appropriate affect.   Data Reviewed: I have personally reviewed following labs and imaging studies  CBC: Recent Labs  Lab 02/07/21 0404 02/07/21 1222 02/08/21 0336 02/09/21 0409 02/10/21 0344  WBC 3.5* 4.1 4.8 5.2 5.5  NEUTROABS 2.4 2.8 3.3 3.6 3.8  HGB 7.1* 7.3* 8.1* 8.3* 8.2*  HCT 24.0* 23.6* 25.7* 26.8* 25.5*  MCV 96.0 93.7 92.1 94.0 91.1  PLT 120* 117* 121* 110* 98*   Basic Metabolic Panel: Recent Labs  Lab 02/06/21 0415 02/07/21 0404 02/08/21 0336 02/09/21 0409 02/10/21 0344  NA 135 137 136 137 135  K 4.0 4.2 3.9 4.0 3.9  CL 100 101 100 99 99  CO2 24 26 26  28 24   GLUCOSE 132* 178* 135* 158* 151*  BUN 90* 100* 84* 105* 105*  CREATININE 2.77* 2.80* 2.82* 2.87* 2.85*  CALCIUM 8.9 8.9 9.0 9.2 9.1  MG 2.2 2.1 2.2 2.4 2.3  PHOS 4.1 4.2 4.3 4.7* 4.4   GFR: Estimated Creatinine Clearance: 18.9 mL/min (A) (by C-G formula based on SCr of 2.85 mg/dL (H)). Liver Function Tests: Recent Labs  Lab 02/06/21 0415 02/07/21 0404 02/08/21 0336 02/09/21 0409 02/10/21 0344  AST 48* 69* 77* 89* 78*  ALT 69* 84* 93* 110* 103*  ALKPHOS 213* 249* 246* 261* 235*  BILITOT 0.8 0.5 0.6 0.7 0.8  PROT 6.5 6.4* 6.8 6.6 6.5  ALBUMIN 2.4* 2.4* 2.4* 2.4* 2.4*   No results for input(s): LIPASE, AMYLASE in the last 168 hours. Recent Labs  Lab 02/03/21 2326  AMMONIA 44*   Coagulation Profile: No results for input(s): INR, PROTIME in the last 168 hours. Cardiac Enzymes: No results for input(s): CKTOTAL, CKMB, CKMBINDEX, TROPONINI in the last 168 hours. BNP (last 3 results) No results for input(s): PROBNP in the last 8760 hours. HbA1C: No results for input(s): HGBA1C in the last 72 hours. CBG: Recent Labs  Lab 02/09/21 2350 02/10/21 0349 02/10/21 0526 02/10/21 0727 02/10/21 1133  GLUCAP 162* 146* 142* 156* 180*   Lipid Profile: No results for input(s): CHOL, HDL, LDLCALC, TRIG, CHOLHDL, LDLDIRECT in the last 72 hours. Thyroid Function Tests: No results for input(s): TSH, T4TOTAL, FREET4, T3FREE, THYROIDAB in the last 72 hours. Anemia Panel: No results  for input(s): VITAMINB12, FOLATE, FERRITIN, TIBC, IRON, RETICCTPCT in the last 72 hours. Sepsis Labs: No results for input(s): PROCALCITON, LATICACIDVEN in the last 168 hours.  No results found for this or any previous visit (from the past 240 hour(s)).   RN Pressure Injury Documentation: Pressure Injury Sacrum Mid red open skin (Active)     Location: Sacrum  Location Orientation: Mid  Staging:   Wound Description (Comments): red open skin  Present on Admission: Yes     Estimated body mass  index is 29.63 kg/m as calculated from the following:   Height as of this encounter: 5\' 7"  (1.702 m).   Weight as of this encounter: 85.8 kg.  Malnutrition Type:  Nutrition Problem: Increased nutrient needs Etiology: acute illness (COVID-19 infection)   Malnutrition Characteristics:  Signs/Symptoms: estimated needs   Nutrition Interventions:  Interventions: Magic cup,Ensure Enlive (each supplement provides 350kcal and 20 grams of protein),Tube feeding   Radiology Studies: DG CHEST PORT 1 VIEW  Result Date: 02/09/2021 CLINICAL DATA:  The hip knee. EXAM: PORTABLE CHEST 1 VIEW COMPARISON:  January 05, 2021 FINDINGS: Feeding catheter transverses the thorax. Dual lead cardiac pacemaker in stable position. Enlarged cardiac silhouette. Calcific atherosclerotic disease of the aorta. Bilateral pleural effusions.  Mild interstitial pulmonary edema. Chest wall postsurgical changes. IMPRESSION: 1. Enlarged cardiac silhouette with mild interstitial pulmonary edema and bilateral pleural effusions. Electronically Signed   By: Fidela Salisbury M.D.   On: 02/09/2021 18:30   US Abdomen Limited RUQ (LIVER/GB)  Result Date: 02/09/2021 CLINICAL DATA:  Abnormal LFTs EXAM: ULTRASOUND ABDOMEN LIMITED RIGHT UPPER QUADRANT COMPARISON:  None. FINDINGS: Gallbladder: No gallstones or wall thickening visualized. No sonographic Murphy sign noted by sonographer. Common bile duct: Diameter: 3 mm Liver: No focal lesion identified. Within normal limits in parenchymal echogenicity. Portal vein is patent on color Doppler imaging with normal direction of blood flow towards the liver. Other: Right-sided pleural effusion. IMPRESSION: 1. Unremarkable ultrasound of the right upper quadrant. 2. Right-sided pleural effusion. Electronically Signed   By: Dahlia Bailiff MD   On: 02/09/2021 15:45    Scheduled Meds: . atorvastatin  80 mg Per Tube Daily  . carvedilol  6.25 mg Per NG tube BID WC  . chlorhexidine  15 mL Mouth Rinse  BID  . dorzolamide-timolol  1 drop Both Eyes BID  . ferrous sulfate  300 mg Per Tube QODAY  . free water  200 mL Per Tube QID  . heparin injection (subcutaneous)  5,000 Units Subcutaneous Q8H  . hydrALAZINE  50 mg Per Tube Q8H  . insulin aspart  0-9 Units Subcutaneous Q4H  . insulin glargine  5 Units Subcutaneous BID  . ipratropium  0.5 mg Nebulization TID  . lactulose  20 g Per Tube BID  . levalbuterol  0.63 mg Nebulization TID  . mouth rinse  15 mL Mouth Rinse q12n4p  . nystatin  5 mL Mouth/Throat QID  . pantoprazole sodium  40 mg Per Tube Daily   Continuous Infusions: . feeding supplement (NEPRO CARB STEADY) 50 mL/hr at 02/08/21 1501    LOS: 18 days   Kerney Elbe, DO [facility] PAGER is on [place]  If 7PM-7AM, please contact night-coverage [email or web address]

## 2021-02-10 NOTE — Consult Note (Signed)
Chief Complaint: Patient was seen in consultation today for image guided gastrostomy tube placement  Chief Complaint  Patient presents with  . Aphasia   at the request of Dr. Alfredia Pollard, Desiree Pollard  Referring Physician(s): Dr. Alfredia Pollard, Desiree Pollard  Supervising Physician: Mir, Desiree Pollard  Patient Status: [facility] - In-pt  History of Present Illness: Desiree Pollard is a 75 y.o. 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 Hospital course complicated by AKI and dysphagia. Patient's p.o intake has bee poor due to deteriorating mental status. Despite the benefit of gastrostomy tube placement is very minimal due to patient's complicated medical situation, patient's sister who is HCPOA  elected for gastrostomy tube placement. CT abdomen on 02/06/21 showed the patient's anatomy is amendable for the gastrostomy tube.   IR was requested for image guided gastrostomy tube placement  Patient laying in bed, not in acute distress.  Patient appears ill and short of breath.  ROS could not obtained due to patient's mental status.   Past Medical History:  Diagnosis Date  . Breast cancer ([place])    2017  . CVA (cerebral vascular accident) ([place]) 11/2018  . Dehydration 07/2020  . Diabetes mellitus without complication ([place])   . Diabetic retinopathy ([place])    PDR OU  . Frequent diarrhea 07/2020  . Hyperkalemia 07/2020  . Hypertensive retinopathy    OU  . Stroke [facility])     Past Surgical History:  Procedure Laterality Date  . CATARACT EXTRACTION Right   . MASTECTOMY Bilateral    "2017" Left Mastectomy; "2019" Right Mastectomy  . PACEMAKER IMPLANT  2017    Allergies: Amlodipine  Medications: Prior to Admission medications   Medication Sig Start Date End Date Taking? Authorizing Provider  atorvastatin (LIPITOR) 80 MG tablet Take 80 mg by mouth daily.  10/13/18  Yes [provider]   carvedilol (COREG) 25 MG tablet Take 25 mg by mouth 2 (two) times daily with a meal.  10/13/18  Yes [provider]  clopidogrel (PLAVIX) 75 MG tablet Take 1 tablet (75 mg total) by mouth daily. 10/24/20  Yes Ghimire, Henreitta Leber, MD  dorzolamide-timolol (COSOPT) 22.3-6.8 MG/ML ophthalmic solution INSTILL 1 DROP INTO BOTH EYES TWICE A DAY Patient taking differently: Place 1 drop into both eyes 2 (two) times daily. 01/14/20  Yes Bernarda Caffey, MD  ferrous sulfate 325 (65 FE) MG tablet Take 325 mg by mouth every other day.   Yes [provider]  furosemide (LASIX) 40 MG tablet Take 40 mg by mouth daily.   Yes [provider]  hydrALAZINE (APRESOLINE) 25 MG tablet Take 1 tablet (25 mg total) by mouth See admin instructions. Take 50 mg by mouth TID. Patient taking differently: Take 25 mg by mouth in the morning and at bedtime. 11/26/20  Yes Samuella Cota, MD  insulin lispro (HUMALOG) 100 UNIT/ML injection Inject 0-6 Units into the skin 3 (three) times daily. Sliding scale :  70-150= 0 units 151-200= 1 unit 201-250=  2 units 251-300= 3 units 301-350= 4 units 351-400= 5 units Over 400 = 6 units and call MD   Yes [provider]  loperamide (IMODIUM A-D) 2 MG tablet Take 1 tablet (2 mg total) by mouth 4 (four) times daily as needed for diarrhea or loose stools. 08/09/20  Yes [name], FNP  pantoprazole (PROTONIX) 40 MG tablet Take 1 tablet (40 mg total) by mouth daily.  10/24/20  Yes Ghimire, Henreitta Leber, MD  Accu-Chek FastClix Lancets MISC 1 each by Other route as directed. To test blood glucose daily. 05/24/19   [provider]  Blood Glucose Monitoring Suppl (FIFTY50 GLUCOSE METER 2.0) w/Device KIT 1 each by Other route See admin instructions. Use to check blood sugars 08/31/18   [provider]  Insulin Pen Needle (FIFTY50 PEN NEEDLES) 32G X 4 MM MISC 1 each by Other route See admin instructions. Use as instructed. 11/17/16   [provider]     Family History  Problem Relation Age of Onset  . Ovarian cancer Mother   . Heart disease Father   . Diabetes Sister   . Diabetes Brother   . Colon cancer Neg Hx   . Esophageal cancer Neg Hx   . Pancreatic cancer Neg Hx   . Stomach cancer Neg Hx   . Liver disease Neg Hx     Social History   Socioeconomic History  . Marital status: Legally Separated    Spouse name: Not on file  . Number of children: 1  . Years of education: Not on file  . Highest education level: Not on file  Occupational History  . Not on file  Tobacco Use  . Smoking status: Former Research scientist (life sciences)  . Smokeless tobacco: Never Used  Vaping Use  . Vaping Use: Never used  Substance and Sexual Activity  . Alcohol use: Not Currently  . Drug use: Never  . Sexual activity: Not Currently  Other Topics Concern  . Not on file  Social History Narrative   Lives with Sister   Social Determinants of Health   Financial Resource Strain: Not on file  Food Insecurity: Not on file  Transportation Needs: Not on file  Physical Activity: Not on file  Stress: Not on file  Social Connections: Not on file     Review of Systems: not obtainable due to patient's mental status    Vital Signs: BP (!) 141/62 (BP Location: Left Arm)   Pulse 61   Temp (!) 95 F (35 C)   Resp (!) 30   Ht _0  (1.702 m)   Wt 189 lb 2.5 oz (85.8 kg)   SpO2 96%   BMI 29.63 kg/m   Physical Exam Constitutional:      Appearance: She is ill-appearing.  HENT:     Nose:     Comments: Has NG tube  Cardiovascular:     Rate and Rhythm: Normal rate and regular rhythm.     Pulses: Normal pulses.     Heart sounds: Normal heart sounds.  Pulmonary:     Breath sounds: Normal breath sounds.     Comments: tachypnia with acssesary muscle use noted  Abdominal:     General: Bowel sounds are normal.  Neurological:     Mental Status: Mental status is at baseline. She is disoriented.     MD Evaluation Airway: Other (comments)  (hard to assess due to patient condition) Heart: WNL Abdomen: WNL Chest/ Lungs: Other (comments) (tachypneic) ASA  Classification: 3 Mallampati/Airway Score: Two (two-three, patient not able to follow command, cannot assess airway properly)  Imaging: CT ABDOMEN WO CONTRAST  Result Date: 02/06/2021 CLINICAL DATA:  Inpatient. Assess anatomy for possible gastrostomy tube placement. Chronic kidney disease. EXAM: CT ABDOMEN WITHOUT CONTRAST TECHNIQUE: Multidetector CT imaging of the abdomen was performed following the standard protocol without IV contrast. COMPARISON:  01/26/2021 abdominal radiograph. 12/01/2014 CT abdomen/pelvis. FINDINGS: Lower chest: Small to moderate right and  small left dependent pleural effusions with mild to moderate passive atelectasis at the dependent lung bases bilaterally. Cardiomegaly. ICD lead seen in the right atrium and terminating in the interventricular septum. Hepatobiliary: Normal liver with no liver mass. Normal gallbladder with no radiopaque cholelithiasis. No biliary ductal dilatation. Pancreas: Normal, with no mass or duct dilation. Spleen: Normal size. No mass. Adrenals/Urinary Tract: Normal adrenals. Nonobstructing 2 mm interpolar right renal and 1 mm upper polar left renal stones. No hydronephrosis. Simple bilateral renal cysts, largest 4.0 cm in the upper left kidney. Stomach/Bowel: Weighted enteric tube terminates in gastric antrum. Stomach is collapsed and grossly normal. Normal caliber small and large bowel loops. Moderate right colonic diverticulosis with no large bowel wall thickening or significant pericolonic fat stranding. Vascular/Lymphatic: Atherosclerotic nonaneurysmal abdominal aorta. No pathologically enlarged lymph nodes in the abdomen. Other: No pneumoperitoneum, ascites or focal fluid collection. Extensive anasarca. Musculoskeletal: No aggressive appearing focal osseous lesions. Mild thoracolumbar spondylosis. IMPRESSION: 1. Well-positioned enteric  tube with tip in the gastric antrum. Stomach is collapsed and grossly normal. 2. Extensive anasarca. 3. Small to moderate right and small left dependent pleural effusions with bibasilar passive atelectasis. 4. Cardiomegaly. 5. Nonobstructing bilateral nephrolithiasis. 6. Moderate right colonic diverticulosis. 7. Aortic Atherosclerosis (ICD10-I70.0). Electronically Signed   By: Ilona Sorrel M.D.   On: 02/06/2021 16:47   DG Abd 1 View  Result Date: 01/26/2021 CLINICAL DATA:  Feeding tube placement. EXAM: ABDOMEN - 1 VIEW COMPARISON:  Abdominal x-ray from same day at [facility] hours. FINDINGS: Tip of the feeding tube is in the distal stomach near the pylorus. The bowel gas pattern is normal. IMPRESSION: 1. Feeding tube tip in the distal stomach. Electronically Signed   By: Titus Dubin M.D.   On: 01/26/2021 12:11   DG Abd 1 View  Result Date: 01/26/2021 CLINICAL DATA:  Feeding tube placement. EXAM: ABDOMEN - 1 VIEW COMPARISON:  Earlier film, same date. FINDINGS: The tip of the feeding tube is not covered on this exam. The tube is coursing down the esophagus and is at least in the body region of the stomach. A lower film OB necessary to establish this exact position. IMPRESSION: The tip of the feeding tube is not included on this exam. A lower abdominal film will be necessary. Electronically Signed   By: Marijo Sanes M.D.   On: 01/26/2021 09:02   DG Abd 1 View  Result Date: 01/26/2021 CLINICAL DATA:  75 year old female feeding tube placement. EXAM: ABDOMEN - 1 VIEW COMPARISON:  To 1122 and earlier. FINDINGS: Portable AP semi upright view at 0509 hours. Enteric feeding tube with guidewire in place terminates in the left lower abdomen compatible with placement in the stomach, gastric ptosis. Negative visible bowel gas pattern. Stable cardiomegaly continued confluent retrocardiac opacity, mild veiling lung base opacity. No acute osseous abnormality identified. Stable chest wall surgical clips. IMPRESSION:  Enteric feeding tube tip placed into the stomach. Gastric ptosis suspected. Advancement of roughly 9 cm would be necessary to allow for post pyloric transit. Electronically Signed   By: Genevie Ann M.D.   On: 01/26/2021 05:54   DG Abd 1 View  Result Date: 01/24/2021 CLINICAL DATA:  Feeding tube placement EXAM: ABDOMEN - 1 VIEW COMPARISON:  January 24, 2021 study obtained earlier in the day FINDINGS: Feeding tube tip is in the distal esophagus with the tip directed toward the gastroesophageal junction. There is ill-defined airspace opacity in the left lower lobe region. Lungs elsewhere are clear. Heart is mildly enlarged with pulmonary  vascular normal. Pacemaker leads are attached the right atrium and right ventricle. There is aortic atherosclerosis. There are surgical clips in the left lower chest region. IMPRESSION: Feeding tube tip in distal esophagus with tip directed toward gastroesophageal junction. Persistent opacity left lower lobe, unchanged from earlier in the day. Stable cardiac prominence. Pacemaker leads attached to right atrium and right ventricle. Aortic Atherosclerosis (ICD10-I70.0). These results will be called to the ordering clinician or representative by the Radiologist Assistant, and communication documented in the PACS or [employer]. Electronically Signed   By: Lowella Grip III M.D.   On: 01/24/2021 11:50   DG Abd 1 View  Result Date: 01/21/2021 CLINICAL DATA:  NG tube placement EXAM: ABDOMEN - 1 VIEW COMPARISON:  01/16/2021 FINDINGS: Feeding tube in place with the tip in the proximal stomach. IMPRESSION: Feeding tube tip in the proximal stomach. Electronically Signed   By: Rolm Baptise M.D.   On: 01/21/2021 03:40   DG Abd 1 View  Result Date: 01/16/2021 CLINICAL DATA:  Feeding tube placement. EXAM: ABDOMEN - 1 VIEW COMPARISON:  Radiograph 01/07/2021, and prior FINDINGS: Tip of the weighted enteric tube is in the left upper quadrant in the region of the mid stomach.  Nonobstructive bowel gas pattern in the upper abdomen. Ovoid calcification in the right abdomen unchanged from prior imaging. Bilateral pleural effusions. IMPRESSION: Tip of the weighted enteric tube in the mid stomach. Electronically Signed   By: Keith Rake M.D.   On: 01/16/2021 17:21   CT HUMERUS RIGHT WO CONTRAST  Result Date: 02/02/2021 CLINICAL DATA:  Diabetic patient with right arm swelling and pain. Although the exam was ordered with contrast, contrast could not be administered due to renal insufficiency. EXAM: CT OF THE RIGHT FOREARM WITHOUT CONTRAST; CT OF THE RIGHT HUMERUS WITHOUT CONTRAST TECHNIQUE: Multidetector CT imaging was performed according to the standard protocol. Multiplanar CT image reconstructions were also generated. COMPARISON:  None. FINDINGS: Bones/Joint/Cartilage No acute bony or joint abnormality is seen. No bony destructive change or periosteal reaction. No gas within bone. No joint effusion. Ligaments Suboptimally assessed by CT. Muscles and Tendons No intramuscular fluid collection. No gas within muscle or tracking along fascial planes. No fatty atrophy. No mass. Soft tissues Diffuse subcutaneous edema is present throughout the arm. No focal fluid collection. Subcutaneous edema is also seen in the right abdominal and chest wall. There is partial visualization of a small appearing right pleural effusion. IMPRESSION: Diffuse subcutaneous edema in the arm and right body wall is compatible with cellulitis, lymphedema or volume overload/anasarca. Negative for CT evidence of osteomyelitis, myositis or septic joint. Partial visualization of a small appearing right pleural effusion. Electronically Signed   By: Inge Rise M.D.   On: 02/02/2021 12:55   CT FOREARM RIGHT WO CONTRAST  Result Date: 02/02/2021 CLINICAL DATA:  Diabetic patient with right arm swelling and pain. Although the exam was ordered with contrast, contrast could not be administered due to renal  insufficiency. EXAM: CT OF THE RIGHT FOREARM WITHOUT CONTRAST; CT OF THE RIGHT HUMERUS WITHOUT CONTRAST TECHNIQUE: Multidetector CT imaging was performed according to the standard protocol. Multiplanar CT image reconstructions were also generated. COMPARISON:  None. FINDINGS: Bones/Joint/Cartilage No acute bony or joint abnormality is seen. No bony destructive change or periosteal reaction. No gas within bone. No joint effusion. Ligaments Suboptimally assessed by CT. Muscles and Tendons No intramuscular fluid collection. No gas within muscle or tracking along fascial planes. No fatty atrophy. No mass. Soft tissues Diffuse subcutaneous edema is  present throughout the arm. No focal fluid collection. Subcutaneous edema is also seen in the right abdominal and chest wall. There is partial visualization of a small appearing right pleural effusion. IMPRESSION: Diffuse subcutaneous edema in the arm and right body wall is compatible with cellulitis, lymphedema or volume overload/anasarca. Negative for CT evidence of osteomyelitis, myositis or septic joint. Partial visualization of a small appearing right pleural effusion. Electronically Signed   By: Inge Rise M.D.   On: 02/02/2021 12:55   DG CHEST PORT 1 VIEW  Result Date: 02/09/2021 CLINICAL DATA:  The hip knee. EXAM: PORTABLE CHEST 1 VIEW COMPARISON:  January 05, 2021 FINDINGS: Feeding catheter transverses the thorax. Dual lead cardiac pacemaker in stable position. Enlarged cardiac silhouette. Calcific atherosclerotic disease of the aorta. Bilateral pleural effusions.  Mild interstitial pulmonary edema. Chest wall postsurgical changes. IMPRESSION: 1. Enlarged cardiac silhouette with mild interstitial pulmonary edema and bilateral pleural effusions. Electronically Signed   By: Fidela Salisbury M.D.   On: 02/09/2021 18:30   DG Abd Portable 1V  Result Date: 01/24/2021 CLINICAL DATA:  Check feeding catheter placement EXAM: PORTABLE ABDOMEN - 1 VIEW  COMPARISON:  01/24/2021 FINDINGS: Feeding catheter is noted extending into the stomach. The weighted portion is noted in the mid stomach directed towards the gastric antrum. No free air is noted. IMPRESSION: Weighted feeding catheter in the midportion of the stomach. Electronically Signed   By: Inez Catalina M.D.   On: 01/24/2021 12:48   DG Abd Portable 1V  Result Date: 01/24/2021 CLINICAL DATA:  Feeding tube placement EXAM: PORTABLE ABDOMEN - 1 VIEW COMPARISON:  January 21, 2011 FINDINGS: Feeding tube tip is at the level of the pharynx. No evident bowel dilatation or air-fluid level to suggest bowel obstruction. No free air. Consolidation left lower lobe noted. IMPRESSION: 1.  Feeding tube tip in pharynx. 2.  No bowel obstruction or free air evident. 3.  Consolidation left lung base. These results will be called to the ordering clinician or representative by the Radiologist Assistant, and communication documented in the PACS or [employer]. Electronically Signed   By: Lowella Grip III M.D.   On: 01/24/2021 10:30   DG Swallowing Func-Speech Pathology  Result Date: 01/29/2021 Objective Swallowing Evaluation: Type of Study: MBS-Modified Barium Swallow Study  Patient Details Name: Washington MRN: [id number] Date of Birth: 02/09/1946 Today's Date: 01/29/2021 Time: SLP Start Time (ACUTE ONLY): 5784 -SLP Stop Time (ACUTE ONLY): 0905 SLP Time Calculation (min) (ACUTE ONLY): 30 min Past Medical History: Past Medical History: Diagnosis Date . Breast cancer ([place])   2017 . CVA (cerebral vascular accident) ([place]) 11/2018 . Dehydration 07/2020 . Diabetes mellitus without complication ([place])  . Diabetic retinopathy ([place])   PDR OU . Frequent diarrhea 07/2020 . Hyperkalemia 07/2020 . Hypertensive retinopathy   OU . Stroke [facility])  Past Surgical History: Past Surgical History: Procedure Laterality Date . CATARACT EXTRACTION Right  . MASTECTOMY Bilateral   "2017" Left Mastectomy; "2019" Right Mastectomy . PACEMAKER  IMPLANT  2017 HPI: Pt is a 75 yo female adm to [facility] with acute metabolic encephalopathy - COVID +, has h/o CVAs x2 - last October 2021, DM, CKD4, dementia, breast cancer.  Imaging of brain previously has shown Stable appearance of right frontal encephalomalacia and bilateral  lacunar type infarcts in the basal ganglia and cerebellar  hemispheres. . Expected evolution of encephalomalacia along the right  inferior cerebellum corresponding with a presumed subacute infarct  on comparison CT.  3. Chronic microvascular angiopathy and parenchymal volume  loss.  CVAs have left pt with aphasia and left weakness per Admit note.  Most recent CXR concerning for potential infectious process.  Swallow eval ordered and pt has not been able to consume po intake due to dysphagia and mentation. She has pulled out 2 Dobbhoff tubes in the past 24 to 48 hours (2/11-2/13). MD requesting SLE to determine if patient is cognitively more ready for PO's, due to patient with reported improved alertness, speech clarity.  Subjective: alert but fatigued Assessment / Plan / Recommendation CHL IP CLINICAL IMPRESSIONS 01/29/2021 Clinical Impression Pt presents with moderately severe oral and mild pharyngeal dysphagia.  Oral transiting difficulties c/b excessive oral holding, lingual rocking, delayed transiting, decreased bolus cohesion and oral retention.  Pt frequently spills boluses (and residuals post swallow) into pharynx after oral holding for approx 30 seconds to 2 minutes.   Self feeding  holding cup - *using straw* and verbal cues to swallow improved transiting but delay was still significant.  Her pharyngeal swallow is not severely impaired = with trace aspiration of secretions noted x1 and mild pharyngeal retention.  Feeding tube in place from nares to stomach and this along with deconditioning may have impaired epiglottic deflection allowing retention which pt does not sense.   Pt did cough x1 during testing and she appeared with secretion  aspiration *trace* but not barium.  Secretions also retained in pharynx even at completion of testing. Will follow up to determine when/if pt may be appropriate to initiate full meals.  Hopeful for continued improvement, however with current level of swallow function, she will be unable to support herself by po alone. SLP Visit Diagnosis Dysphagia, oropharyngeal phase (R13.12) Attention and concentration deficit following -- Frontal lobe and executive function deficit following -- Impact on safety and function Risk for inadequate nutrition/hydration;Mild aspiration risk;Moderate aspiration risk   CHL IP TREATMENT RECOMMENDATION 01/29/2021 Treatment Recommendations Therapy as outlined in treatment plan below   Prognosis 01/29/2021 Prognosis for Safe Diet Advancement Fair Barriers to Reach Goals Time post onset Barriers/Prognosis Comment -- CHL IP DIET RECOMMENDATION 01/29/2021 SLP Diet Recommendations Other (Comment) Liquid Administration via Straw;Spoon Medication Administration Via alternative means Compensations Slow rate;Small sips/bites;Other (Comment) Postural Changes --   CHL IP OTHER RECOMMENDATIONS 01/29/2021 Recommended Consults -- Oral Care Recommendations -- Other Recommendations Have oral suction available   CHL IP FOLLOW UP RECOMMENDATIONS 01/29/2021 Follow up Recommendations Skilled Nursing facility   [facility] IP FREQUENCY AND DURATION 01/29/2021 Speech Therapy Frequency (ACUTE ONLY) min 2x/week Treatment Duration 1 week      CHL IP ORAL PHASE 01/29/2021 Oral Phase Impaired Oral - Pudding Teaspoon -- Oral - Pudding Cup -- Oral - Honey Teaspoon -- Oral - Honey Cup -- Oral - Nectar Teaspoon Holding of bolus;Decreased bolus cohesion;Premature spillage;Lingual/palatal residue Oral - Nectar Cup -- Oral - Nectar Straw Holding of bolus;Premature spillage;Decreased bolus cohesion;Lingual/palatal residue Oral - Thin Teaspoon Lingual/palatal residue;Holding of bolus;Decreased bolus cohesion;Premature spillage Oral - Thin  Cup -- Oral - Thin Straw Lingual/palatal residue;Decreased bolus cohesion;Premature spillage;Holding of bolus Oral - Puree Holding of bolus;Delayed oral transit;Premature spillage;Lingual/palatal residue Oral - Mech Soft -- Oral - Regular -- Oral - Multi-Consistency -- Oral - Pill -- Oral Phase - Comment Use of counting 1,2,3 to aid in swallow, verbal cues to swallow and self feeding were attempted with inconsistent success, A-P lingual rocking noted with pt containing bolus with oral tongue base prolonged  CHL IP PHARYNGEAL PHASE 01/29/2021 Pharyngeal Phase Impaired Pharyngeal- Pudding Teaspoon -- Pharyngeal -- Pharyngeal- Pudding Cup --  Pharyngeal -- Pharyngeal- Honey Teaspoon -- Pharyngeal -- Pharyngeal- Honey Cup -- Pharyngeal -- Pharyngeal- Nectar Teaspoon Pharyngeal residue - valleculae;Pharyngeal residue - pyriform Pharyngeal Material does not enter airway Pharyngeal- Nectar Cup -- Pharyngeal -- Pharyngeal- Nectar Straw Pharyngeal residue - valleculae Pharyngeal Material does not enter airway Pharyngeal- Thin Teaspoon -- Pharyngeal -- Pharyngeal- Thin Cup -- Pharyngeal -- Pharyngeal- Thin Straw Pharyngeal residue - valleculae;Pharyngeal residue - pyriform Pharyngeal Material does not enter airway Pharyngeal- Puree Reduced epiglottic inversion;Reduced tongue base retraction;Pharyngeal residue - valleculae Pharyngeal -- Pharyngeal- Mechanical Soft -- Pharyngeal -- Pharyngeal- Regular -- Pharyngeal -- Pharyngeal- Multi-consistency -- Pharyngeal -- Pharyngeal- Pill -- Pharyngeal -- Pharyngeal Comment Barium adhered to feeding tube at times  CHL IP CERVICAL ESOPHAGEAL PHASE 01/29/2021 Cervical Esophageal Phase WFL Pudding Teaspoon -- Pudding Cup -- Honey Teaspoon -- Honey Cup -- Nectar Teaspoon -- Nectar Cup -- Nectar Straw -- Thin Teaspoon -- Thin Cup -- Thin Straw -- Puree -- Mechanical Soft -- Regular -- Multi-consistency -- Pill -- Cervical Esophageal Comment -- Kathleen Lime, MS [facility] SLP Acute Rehab Services Office  [phone] Pager [phone] [name] 01/29/2021, 12:19 PM              VAS [place] UPPER EXTREMITY VENOUS DUPLEX  Result Date: 01/27/2021 UPPER VENOUS STUDY  Indications: Swelling Risk Factors: Immobility. Comparison Study: No previous exam Performing Technologist: Vonzell Schlatter RVT  Examination Guidelines: A complete evaluation includes B-mode imaging, spectral Doppler, color Doppler, and power Doppler as needed of all accessible portions of each vessel. Bilateral testing is considered an integral part of a complete examination. Limited examinations for reoccurring indications may be performed as noted.  Right Findings: +----------+------------+---------+-----------+----------+-------+ RIGHT     CompressiblePhasicitySpontaneousPropertiesSummary +----------+------------+---------+-----------+----------+-------+ IJV           Full       Yes       Yes                      +----------+------------+---------+-----------+----------+-------+ Subclavian    Full       Yes       Yes                      +----------+------------+---------+-----------+----------+-------+ Axillary      Full       Yes       Yes                      +----------+------------+---------+-----------+----------+-------+ Brachial      Full       Yes       Yes                      +----------+------------+---------+-----------+----------+-------+ Radial        Full                                          +----------+------------+---------+-----------+----------+-------+ Ulnar         Full                                          +----------+------------+---------+-----------+----------+-------+ Cephalic      Full                                          +----------+------------+---------+-----------+----------+-------+  Basilic       Full                                          +----------+------------+---------+-----------+----------+-------+  Summary:  Right: No evidence of  deep vein thrombosis in the upper extremity. No evidence of superficial vein thrombosis in the upper extremity.  Left: No evidence of thrombosis in the subclavian.  *See table(s) above for measurements and observations.  Diagnosing physician: Ruta Hinds MD Electronically signed by Ruta Hinds MD on 01/27/2021 at 4:24:36 PM.    Final    US Abdomen Limited RUQ (LIVER/GB)  Result Date: 02/09/2021 CLINICAL DATA:  Abnormal LFTs EXAM: ULTRASOUND ABDOMEN LIMITED RIGHT UPPER QUADRANT COMPARISON:  None. FINDINGS: Gallbladder: No gallstones or wall thickening visualized. No sonographic Murphy sign noted by sonographer. Common bile duct: Diameter: 3 mm Liver: No focal lesion identified. Within normal limits in parenchymal echogenicity. Portal vein is patent on color Doppler imaging with normal direction of blood flow towards the liver. Other: Right-sided pleural effusion. IMPRESSION: 1. Unremarkable ultrasound of the right upper quadrant. 2. Right-sided pleural effusion. Electronically Signed   By: Dahlia Bailiff MD   On: 02/09/2021 15:45    Labs:  CBC: Recent Labs    02/07/21 1222 02/08/21 0336 02/09/21 0409 02/10/21 0344  WBC 4.1 4.8 5.2 5.5  HGB 7.3* 8.1* 8.3* 8.2*  HCT 23.6* 25.7* 26.8* 25.5*  PLT 117* 121* 110* 98*    COAGS: Recent Labs    11/24/20 0943  INR 1.2  APTT 33    BMP: Recent Labs    06/10/20 1433 08/07/20 1559 10/19/20 0001 02/07/21 0404 02/08/21 0336 02/09/21 0409 02/10/21 0344  NA 140 143   < > 137 136 137 135  K 5.8* 6.8*   < > 4.2 3.9 4.0 3.9  CL 113* 115*   < > 101 100 99 99  CO2 17* 17*   < > _0 GLUCOSE 106* 113*   < > 178* 135* 158* 151*  BUN 27 27   < > 100* 84* 105* 105*  CALCIUM 9.8 9.8   < > 8.9 9.0 9.2 9.1  CREATININE 2.66* 2.56*   < > 2.80* 2.82* 2.87* 2.85*  GFRNONAA 17* 18*   < > 17* 17* 16* 17*  GFRAA 19* 20*  --   --   --   --   --    < > = values in this interval not displayed.    LIVER FUNCTION TESTS: Recent Labs     02/07/21 0404 02/08/21 0336 02/09/21 0409 02/10/21 0344  BILITOT 0.5 0.6 0.7 0.8  AST 69* 77* 89* 78*  ALT 84* 93* 110* 103*  ALKPHOS 249* 246* 261* 235*  PROT 6.4* 6.8 6.6 6.5  ALBUMIN 2.4* 2.4* 2.4* 2.4*    TUMOR MARKERS: No results for input(s): AFPTM, CEA, CA199, CHROMGRNA in the last 8760 hours.  Assessment and Plan: 75 y.o. 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 Patient's p.o. intake has been poor due to deteriorating mental status.  Despite the benefit of gastrostomy tube placement is very minimal due to patient's complicated medical situation, patient's sister who is HCPOA  elected for gastrostomy tube placement.   Tentatively scheduled for Wednesday Mar 14, 2021.  Currently on SQ heparin q8h and tube feeding.  Held heparin after  morning dose on Tuesday 02/11/21.  Will hold tube feed at 0000 02/16/21.  Will order INR to be drawn the day of procedure.   Risks and benefits image guided gastrostomy tube placement was discussed with the patient's sister over the phone including, but not limited to bleeding, infection, peritonitis and/or damage to adjacent structures.  All of the sister's questions were answered, the she is agreeable to proceed.   Consent signed and in chart.   Thank you for this interesting consult.  I greatly enjoyed meeting Desiree Pollard and look forward to participating in their care.  A copy of this report was sent to the requesting provider on this date.  Electronically Signed: Tera Mater, PA-C 02/10/2021, 2:47 PM   I spent a total of 40 Minutes   in face to face in clinical consultation, greater than 50% of which was counseling/coordinating care for gastrostomy tube placement.

## 2021-02-10 NOTE — Progress Notes (Signed)
  Speech Language Pathology Treatment: Dysphagia  Patient Details Name: Desiree Pollard MRN: [id number] DOB: 08/21/1946 Today's Date: 02/10/2021 Time: 2409-7353 SLP Time Calculation (min) (ACUTE ONLY): 20 min  Assessment / Plan / Recommendation Clinical Impression  Patient seen for skilled ST treatment focusing on dysphagia goals. Patient was alert and verbally responsive throughout session but continues to be easily fatigued. SLP performed oral care prior to PO's and patient's oral mucosa was moist and oral cavity and dentures were free of any secretions or PO residuals. Patient accepted 1/2 container of Magic Cup supplement and although she continues with delayed oral transit and decreased oral control, full clearance of PO's were observed with each spoonful. Patient observed to have suspected delayed swallow initiation as well as multiple swallows but no coughing, throat clearing or other s/s that would be concerning for aspiration or penetration. Patient's voice remained clear throughout. Patient had to be cued to verbalize a more definitive yes/no as her "uh huh"/ yes response often mistaken for "uh uh"/no. SLP performed oral care however patient's oral cavity free of any PO residuals or secretions. When asked, patient indicated she wished to keep her dentures in place. Patient able to clearly verbalize at 1-2 word level to respond, such as when SLP told her it was nice to see her more awake and she replied "for now".   HPI HPI: Pt is a 75 yo female adm to [facility] with acute metabolic encephalopathy - COVID +, has h/o CVAs x2 - last October 2021, DM, CKD4, dementia, breast cancer.  Imaging of brain previously has shown Stable appearance of right frontal encephalomalacia and bilateral  lacunar type infarcts in the basal ganglia and cerebellar  hemispheres. . Expected evolution of encephalomalacia along the right  inferior cerebellum corresponding with a presumed subacute infarct  on comparison CT.  3.  Chronic microvascular angiopathy and parenchymal volume loss.  CVAs have left pt with aphasia and left weakness per Admit note. After 2/16 MBS, patient was started on Dys 1 and thin liquids trials and currently is tolerating a full Dys 1, thin liquids diet. PO intake continues to be very poor and patient's sister has agreed to PEG with plan for PEG placement on 3/2.      SLP Plan  Continue with current plan of care       Recommendations  Diet recommendations: Dysphagia 1 (puree);Thin liquid Liquids provided via: Cup;Straw Medication Administration: Via alternative means Supervision: Staff to assist with self feeding;Full supervision/cueing for compensatory strategies Compensations: Slow rate;Small sips/bites;Other (Comment) Postural Changes and/or Swallow Maneuvers: Seated upright 90 degrees;Upright 30-60 min after meal                Oral Care Recommendations: Oral care QID Follow up Recommendations: 24 hour supervision/assistance SLP Visit Diagnosis: Dysphagia, oral phase (R13.11) Plan: Continue with current plan of care       [place], MA, CCC-SLP Speech Therapy

## 2021-02-10 NOTE — Plan of Care (Signed)
  Problem: Clinical Measurements: Goal: Will remain free from infection Outcome: Progressing   Problem: Nutrition: Goal: Adequate nutrition will be maintained Outcome: Progressing   Problem: Pain Managment: Goal: General experience of comfort will improve Outcome: Progressing   Problem: Safety: Goal: Ability to remain free from injury will improve Outcome: Progressing   Problem: Skin Integrity: Goal: Risk for impaired skin integrity will decrease Outcome: Progressing

## 2021-02-10 NOTE — Progress Notes (Signed)
Warming blanket applied. Per Doctor Alfredia Ferguson take blanket off if tempeture is >99. Will re check temp in 1 hour.

## 2021-02-11 ENCOUNTER — Inpatient Hospital Stay (HOSPITAL_COMMUNITY): Payer: Medicare HMO

## 2021-02-11 DIAGNOSIS — Z8673 Personal history of transient ischemic attack (TIA), and cerebral infarction without residual deficits: Secondary | ICD-10-CM | POA: Diagnosis not present

## 2021-02-11 DIAGNOSIS — N184 Chronic kidney disease, stage 4 (severe): Secondary | ICD-10-CM | POA: Diagnosis not present

## 2021-02-11 DIAGNOSIS — U071 COVID-19: Secondary | ICD-10-CM | POA: Diagnosis not present

## 2021-02-11 DIAGNOSIS — G9341 Metabolic encephalopathy: Secondary | ICD-10-CM | POA: Diagnosis not present

## 2021-02-11 LAB — COMPREHENSIVE METABOLIC PANEL
ALT: 103 U/L — ABNORMAL HIGH (ref 0–44)
AST: 69 U/L — ABNORMAL HIGH (ref 15–41)
Albumin: 2.6 g/dL — ABNORMAL LOW (ref 3.5–5.0)
Alkaline Phosphatase: 283 U/L — ABNORMAL HIGH (ref 38–126)
Anion gap: 14 (ref 5–15)
BUN: 103 mg/dL — ABNORMAL HIGH (ref 8–23)
CO2: 25 mmol/L (ref 22–32)
Calcium: 9.4 mg/dL (ref 8.9–10.3)
Chloride: 99 mmol/L (ref 98–111)
Creatinine, Ser: 2.82 mg/dL — ABNORMAL HIGH (ref 0.44–1.00)
GFR, Estimated: 17 mL/min — ABNORMAL LOW (ref >60)
Glucose, Bld: 136 mg/dL — ABNORMAL HIGH (ref 70–99)
Potassium: 4 mmol/L (ref 3.5–5.1)
Sodium: 138 mmol/L (ref 135–145)
Total Bilirubin: 0.5 mg/dL (ref 0.3–1.2)
Total Protein: 7.3 g/dL (ref 6.5–8.1)

## 2021-02-11 LAB — CBC WITH DIFFERENTIAL/PLATELET
Abs Immature Granulocytes: 0.3 10*3/uL — ABNORMAL HIGH (ref 0.00–0.07)
Basophils Absolute: 0.1 10*3/uL (ref 0.0–0.1)
Basophils Relative: 2 %
Eosinophils Absolute: 0.2 10*3/uL (ref 0.0–0.5)
Eosinophils Relative: 2 %
HCT: 27.8 % — ABNORMAL LOW (ref 36.0–46.0)
Hemoglobin: 8.7 g/dL — ABNORMAL LOW (ref 12.0–15.0)
Immature Granulocytes: 4 %
Lymphocytes Relative: 7 %
Lymphs Abs: 0.5 10*3/uL — ABNORMAL LOW (ref 0.7–4.0)
MCH: 29.3 pg (ref 26.0–34.0)
MCHC: 31.3 g/dL (ref 30.0–36.0)
MCV: 93.6 fL (ref 80.0–100.0)
Monocytes Absolute: 0.6 10*3/uL (ref 0.1–1.0)
Monocytes Relative: 8 %
Neutro Abs: 5.6 10*3/uL (ref 1.7–7.7)
Neutrophils Relative %: 77 %
Platelets: 122 10*3/uL — ABNORMAL LOW (ref 150–400)
RBC: 2.97 MIL/uL — ABNORMAL LOW (ref 3.87–5.11)
RDW: 16.2 % — ABNORMAL HIGH (ref 11.5–15.5)
WBC: 7.3 10*3/uL (ref 4.0–10.5)
nRBC: 0 % (ref 0.0–0.2)

## 2021-02-11 LAB — GLUCOSE, CAPILLARY
Glucose-Capillary: 126 mg/dL — ABNORMAL HIGH (ref 70–99)
Glucose-Capillary: 134 mg/dL — ABNORMAL HIGH (ref 70–99)
Glucose-Capillary: 134 mg/dL — ABNORMAL HIGH (ref 70–99)
Glucose-Capillary: 99 mg/dL (ref 70–99)
Glucose-Capillary: 75 mg/dL (ref 70–99)

## 2021-02-11 LAB — PHOSPHORUS: Phosphorus: 3.6 mg/dL (ref 2.5–4.6)

## 2021-02-11 LAB — MAGNESIUM: Magnesium: 2.5 mg/dL — ABNORMAL HIGH (ref 1.7–2.4)

## 2021-02-11 IMAGING — DX DG CHEST 1V PORT
1 series · 2 of 2 positions shown · non-contrast
Comparison: Portable exam [CN] hours compared to [DATE]

CLINICAL DATA: Shortness of breath, history breast cancer, stroke,
hypertension, diabetes mellitus

EXAM:
PORTABLE CHEST 1 VIEW

[Series 1: chest ap · 0.14mm/px · 2 of 2 slices shown]
[im 1/2]
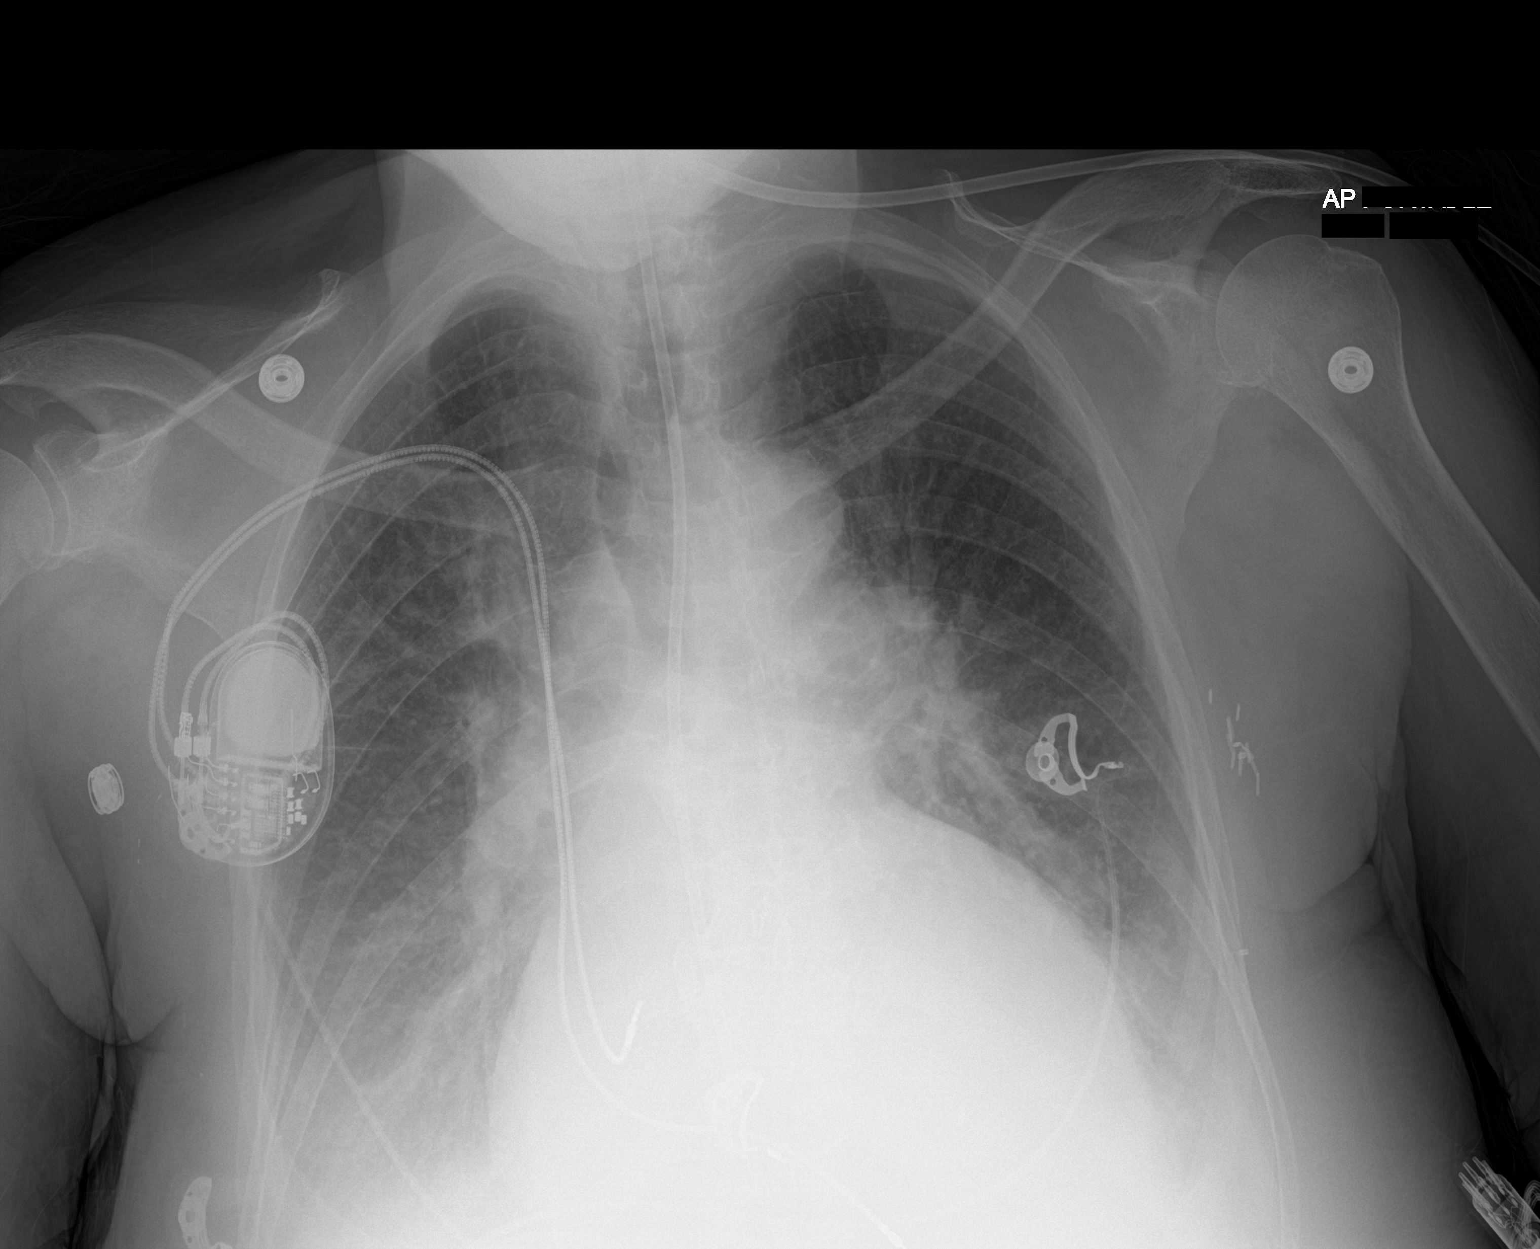
[im 2/2]
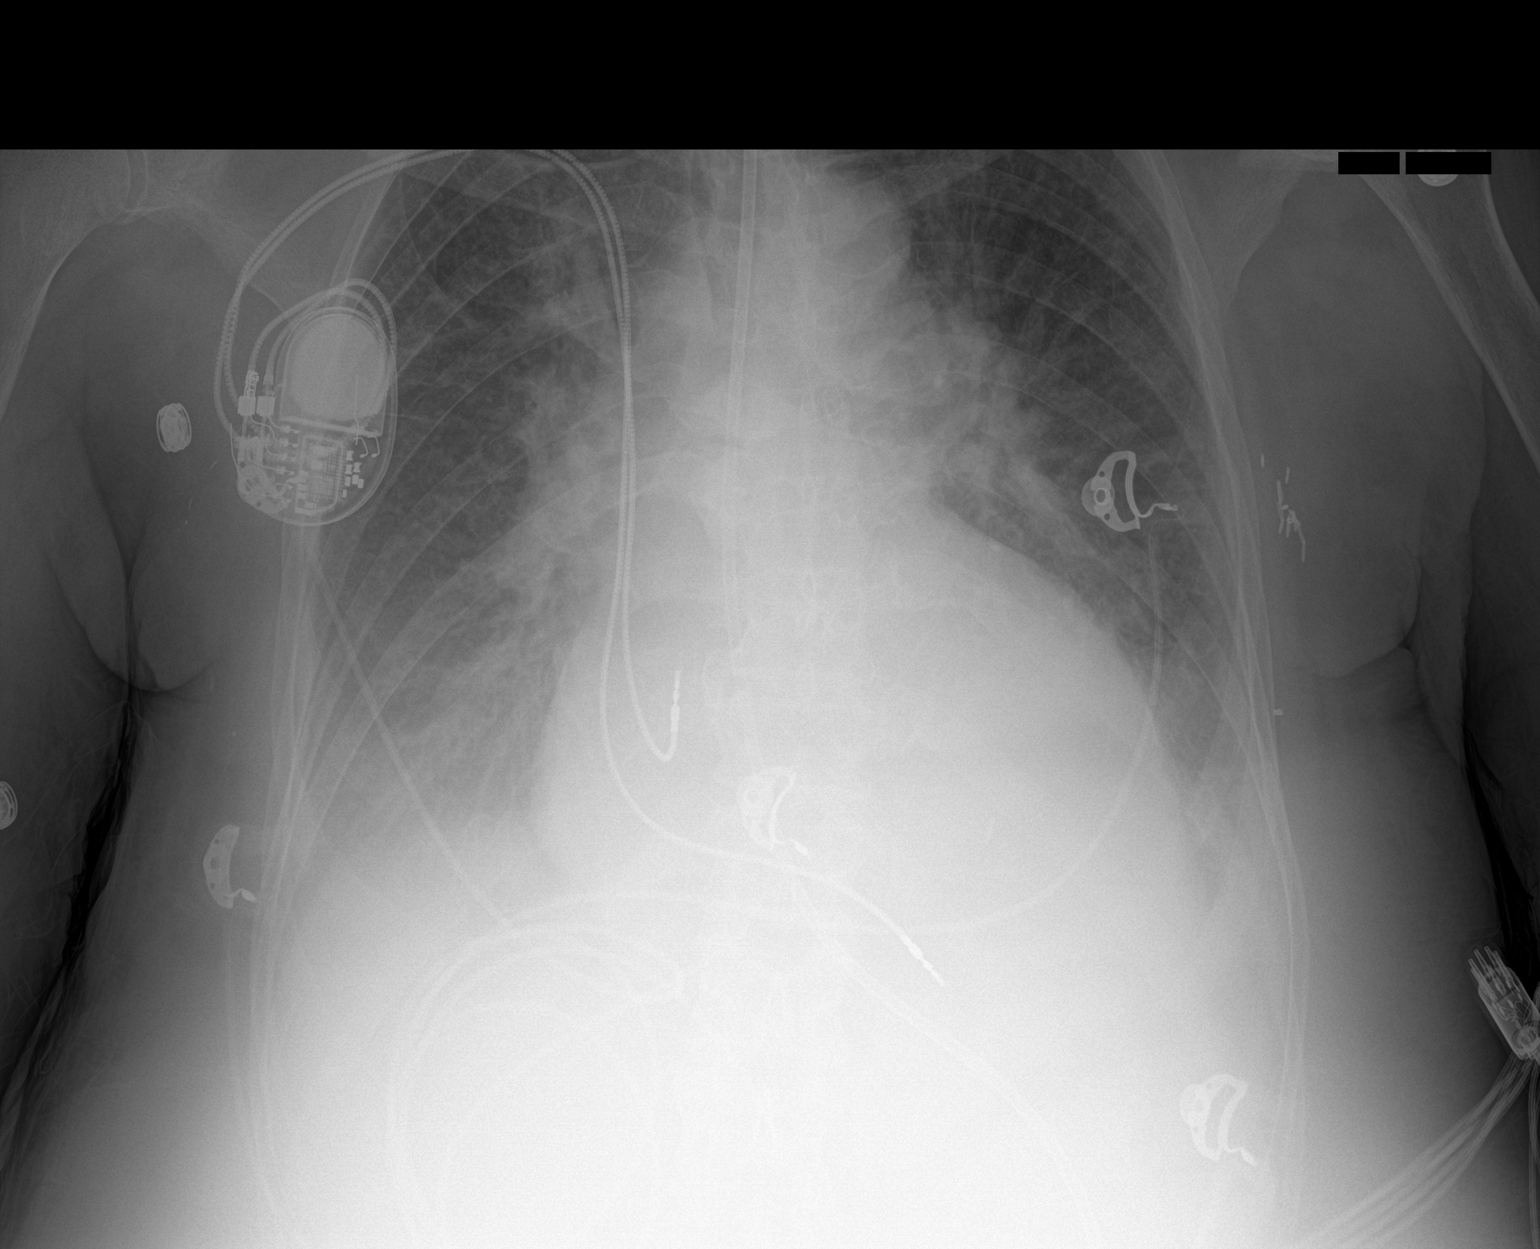

[2 of 2 positions shown; findings below may reference images not displayed]

FINDINGS: Feeding tube extends into abdomen.

RIGHT subclavian pacemaker with leads projecting over RIGHT atrium
and RIGHT ventricle.

Enlargement of cardiac silhouette with pulmonary vascular
congestion.

BILATERAL pulmonary infiltrates consistent with pulmonary edema.

Bibasilar pleural effusions, small.

No pneumothorax.

Atherosclerotic calcification at aortic arch.

Surgical clips at LEFT axilla.
IMPRESSION: Persistent pulmonary edema and CHF.

Aortic Atherosclerosis ([CN]-[CN]).

## 2021-02-11 MED ORDER — CEFAZOLIN SODIUM-DEXTROSE 2-4 GM/100ML-% IV SOLN
2.0000 g | Freq: Once | INTRAVENOUS | Status: AC
  Administered 2021-02-12: 2 g via INTRAVENOUS
  Filled 2021-02-11: qty 100

## 2021-02-11 MED ORDER — PANCRELIPASE (LIP-PROT-AMYL) 10440-39150 UNITS PO TABS
20880.0000 [IU] | ORAL_TABLET | Freq: Once | ORAL | Status: DC
  Filled 2021-02-11: qty 2

## 2021-02-11 MED ORDER — FUROSEMIDE 10 MG/ML IJ SOLN
60.0000 mg | Freq: Two times a day (BID) | INTRAMUSCULAR | Status: DC
  Administered 2021-02-11 – 2021-02-12 (×2): 60 mg via INTRAVENOUS
  Filled 2021-02-11 (×2): qty 6

## 2021-02-11 MED ORDER — PANCRELIPASE (LIP-PROT-AMYL) 10440-39150 UNITS PO TABS
20880.0000 [IU] | ORAL_TABLET | Freq: Once | ORAL | Status: AC
  Administered 2021-02-11: 20880 [IU]
  Filled 2021-02-11 (×2): qty 2

## 2021-02-11 MED ORDER — SODIUM BICARBONATE 650 MG PO TABS
650.0000 mg | ORAL_TABLET | Freq: Once | ORAL | Status: AC
  Administered 2021-02-11: 650 mg
  Filled 2021-02-11 (×2): qty 1

## 2021-02-11 MED ORDER — SODIUM BICARBONATE 650 MG PO TABS: 650.0000 mg | ORAL_TABLET | Freq: Once | ORAL | Status: DC

## 2021-02-11 MED ORDER — FUROSEMIDE 10 MG/ML IJ SOLN
60.0000 mg | Freq: Once | INTRAMUSCULAR | Status: AC
  Administered 2021-02-11: 60 mg via INTRAVENOUS
  Filled 2021-02-11: qty 6

## 2021-02-11 NOTE — Progress Notes (Signed)
The protocol for unclogging the feeding tube was implemented. However, the Sodium Bicarbonate and the Viokace (Lipase/protease/Amylase) were not able to unclog the feeding tube.  The PCP was notified.

## 2021-02-11 NOTE — Progress Notes (Signed)
NUTRITION NOTE RD working remotely.  Secure chat message received that patient's small bore NGT is clogged pending PEG placement 3/2.  RN reports that she has entered the unclogged protocol. RD recommends this and if unsuccessful recommended removal of tube and replacement vs. no replacement pending PEG 3/2.  RD will continue to follow per protocol.      Jarome Matin, MS, RD, LDN, CNSC Inpatient Clinical Dietitian RD pager # available in [place]  After hours/weekend pager # available in [facility]

## 2021-02-11 NOTE — Progress Notes (Signed)
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  MRI brain negative for acute finding.  Hospital course complicated by AKI and dysphagia.  Nephrology did not feel patient is a candidate for HD and recommended DNR and possible hospice. She pulled out NGT twice but replaced for the third time.  SLP upgraded to dysphagia 1 diet.  Dietitian adjusted tube feeds to 18 hours regimen and encourage p.o. intake with the hope to avoid PEG tube but patient's mental status remain poor still dietitian has adjusted tube feedings to 24-hour feeds.  She also have progressive renal failure with azotemia and possible uremia but Cr has stabilized. Reportedly separated from spouse about a year ago. Sister trying to get HCPOA done here and this was done today with Chaplain as patient was aware of self, place and time and designated her Sister.  Will need to discuss further goals of care with the patient and the patient's sister given her continued poor p.o. intake and sister feels that she is taking in more with her at bedside and with the nursing staff.  The sister has elected to get a PEG tube so that she can discharge from the hospital but does not want this done and does not want to give verbal consent until she is talked to the rest of her family and patient sister wants to be at the hospital when this is happening and the only time that this can be done when she is off of work is next Wednesday Feb 13, 2021.  Renal Fxn  has trende been relatively stable d up and Hemoglobin improved after 1 unit of pRBC.  Overnight on 2/26-2/27 she became hypothermic so bear hugger was reinitiated and temperature is now improving on repeat.  Unclear etiology why she  became hypothermic again.  LFTs are slowly trending down now and right upper quadrant ultrasound was negative for any for any pathology.  Patient was a little tachypneic yesterday so obtained a chest x-ray done yesterday showed enlarged cardiac silhouette with mild interstitial pulmonary edema and bilateral pleural effusions so she was given a dose of IV Lasix again.  We will continue IV Lasix again today at 60 mg daily.  Plan is still for PEG tube placement Wednesday with consent to be given today.  Assessment & Plan:   Principal Problem:   Acute metabolic encephalopathy Active Problems:   Insulin dependent type 2 diabetes mellitus (HCC)   Hypertension   History of stroke   CKD (chronic kidney disease), stage IV ([place])   COVID-19 virus infection   Hypernatremia  Acute metabolic encephalopathy/delirium in patient with history of dementia -Per sister, able to walk with walker and converse at baseline but nowhere close to baseline and may be at a new baseline.  She now have significant azotemia concerning for uremia.  TSH, B12, EEG, CT head and MRI brain negative for acute finding. Neurology recommended treating treatable causes.  -Reorientation and delirium precautions. -Repeat ammonia slightly elevated.  Start lactulose 20 g twice daily given Ammonia Level of 44 -Check RPR and non-reactive -Continue to Monitor closely as she waxes and wanes and she is awake and mumbles sometimes but then becomes somnolent at other times.  Oropharyngeal dysphagia-removed NGT x2, and replaced for  the third time -Upgraded to dysphagia 1 diet but not able to take p.o. due to deteriorating mental status. -Resume continuous tube feed again given that her po Intake remains poor -I am not sure if PEG tube can reverse the course but patient is sister is insistent that she get some nutrition and did not want to make her palliative care so she elected for PEG tube placement.  CT scan done of the abdomen pelvis today and  her anatomy is amenable to getting a PEG tube.  PEG tube to be placed however patient sister wants to be at the hospital when this is done and this cannot be done till next Wednesday given that she is now the sole caregiver of her sister.  PEG tube is to be placed on February 23, 2021 with consent given on Monday, 02/10/2021 -SLP is following and recommending continuing current plan of care as patient is still tolerating dysphagia 1 with thin liquids but p.o. intake continues to be extremely poor -Today in the NG tube got clogged and she is pending PEG placement in the morning; dietitian was notified and dietitian placed unclogging protocol orders; if this is unsuccessful then will recommend removing the tubal and replacement versus no replacement as patient is PEG tube in the morning -Palliative Care involved closely but patient's sister does not want any more palliative discussions  Nonoliguric AKI on CKD-4:  -Progressive azotemia and possible uremia. -BUN/Cr the last week or so has been trending up very slightly; is relatively stable now and within the same range as BUN/creatinine has gone from 105/2.87 -> 105/2.85 -> 103/2.82 with her GFR being around 17-21 last week or so -Renal ultrasound without acute finding. -Not a candidate for HD per nephrology -Was initiated on IV Lasix 60 mg twice daily and dose was reduced to 60 mg daily but it was held yesterday and then given later in the evening after chest x-ray findings -I spoke with Dr. Jonnie Finner of nephrology who feels that there is really not much else to offer as they will not going to do anything with her elevated BUN/creatinine but recommends to cut back on the Lasix as appropriate  Pancytopenia: Leukopenia improved and thrombocytopenia stable. FOBT negative. Anemia panel consistent with ACD.  Due to malnutrition? -Continue with ferrous sulfate 300 mg per tube every other day -She was typed and Screened and transfused 1 unit -Patient's WBC is now 7.3,  Hgb/Hct is now 8.7/27.8, and Platelet Cont went from 117 -> 121 -> 110 -> 98 and is now 122 today -Continue to transfuse for hemoglobin less than 7; will hold for VT prophylaxis today and reevaluate platelet count in the morning -Continue to Monitor Carefully   Anasarca secondary to hypoalbuminemia Acute right upper extremity swelling: RUE negative for DVT.  CT suggests anasarca. -IV Lasix as above and was getting IV 60 mg q12h and was changed to 60 mg p.o. daily IV and yesterday's dose was initially held but then given again given her chest x-ray findings -We will likely need to be on p.o. Lasix at discharge now likely 40 mg p.o. daily -Optimize nutrition and nutritionist to change to tube feedings continuously now -Patient is -964 mL now  -May give some Albumin but after discussion with nephrology they recommend against it -Recommend elevation and TED hose now and TED hose not placed   Hypothermia: Unclear cause; became hypothermic overnight had temperature 94.1 -Intermittently on the Bair Hugger and temperature is improved and now stabilized  COVID-19 viral infection: Tested positive  on 01/02/2021.  Received remdesivir x3 doses. -No indication for airborne isolation now that she is out of the window now   Hypernatremia/Hyperkalemia: Resolved as sodium is 138 and potassium is 4.0 -Continue to monitor and trend and repeat CMP in a.m.  Essential HTN:  -Continue carvedilol 6.25 mg twice daily per tube. and hydralazine 50 mg per tube every 8 -As needed hydralazine with parameters. -Continue to monitor blood pressures per protocol and blood pressure was 141/59  Acute on Chronic Diastolic CHF Acute respiratory failure with hypoxia in setting of acute on chronic diastolic CHF complicated by [id number] viral infection -She is status post Covid treatment -SpO2: (!) 85 % O2 Flow Rate (L/min): 3 L/min  -Start Xopenex and Atrovent and continue with diuresis and increase back to twice daily  dosing -Chest x-ray shows "Feeding tube extends into abdomen. RIGHT subclavian pacemaker with leads projecting over RIGHT atrium and RIGHT ventricle. Enlargement of cardiac silhouette with pulmonary vascular congestion. BILATERAL pulmonary infiltrates consistent with pulmonary edema. Bibasilar pleural effusions, small. No pneumothorax. Atherosclerotic calcification at aortic arch. Surgical clips at LEFT axilla." -IV Lasix as above and is now on IV 60 daily but will go up to IV 60 twice daily.  Renal function has continued to climb but now appears to be stabilized given her CKD stage IV -As above -Strict I's and O's and daily weights and she is -964 mL since admission -Monitor fluid and respiratory status closely; as above  History of CVA in 09/2020 with residual left hemiparesis and expressive aphasia:  -CT head and MRI brain without acute finding. -Continue Plavix, Lipitor via tube -PT has recommended SNF and will get TOC to assist with placement  Acute urinary retention -Resolved. -Monitor urinary output. -She is -964 mL since admission  Goals of care: Multiple comorbidities as above.  Continued to have Progressive decline.  Poor prognosis.  However, still full code which will pose more harm than benefit.  DNR/DNI recommended by multiple experts including palliative medicine in the past but patient's sister, Ms. Cheek strongly insists on full code and full scope of care. My colleague had discussed patient's progressive decline with Ms. cheek at bedside and I have called her over the phone daily.  [name] is trying to obtain [facility] POA and this has now been done.  [name] states Ms. Kole and Mr.Cordy were "separated" about a year ago when she went into nursing home.  Ms. Mccarrell lived with Ms. Cheek when she was out of nursing home since then.   Palliative Care reconsulted for [place] Discussion and Notified Ethics team about current situation.  We continued to currently recommend the patient's CODE  STATUS be changed to DNR given that her work measures would be somewhat futile given the patient's state of health and current comorbidities.  The patient's mental status continues to wax and wane and she is expressed to the medical team that she would not want tube feedings and is tired however she continues to have very poor p.o. intake.  Currently the situation is very difficult and despite the current findings artificial feeding/PEG tube will not provide any meaningful recovery she may end up with a PEG tube regardless now that her sister is healthcare power of attorney.  Sister has elected for PEG tube placement and this will be done on 02-19-21.  Patient's prognosis extremely poor and limited given her comorbidities and I feel that she would not do well even with the PEG  Abnormal LFTs, slowly trending up and  now stabilized and improving -Mild.  AST is 48, ALT 69 but have been relatively elevated since the beginning of her admission; now trended up with the peak of AST being 89 and ALT being 110 and now slowly trending back down and AST is 69 and ALT is 103 -Continue to monitor and trend and repeat CMP in a.m. -Right upper quadrant ultrasound was unremarkable acute hepatitis panel is pending -Continue to monitor and trend hepatic function panel and repeat CMP in a.m.  Overweight Body mass index is 29.21 kg/m. Nutrition Problem: Increased nutrient needs Etiology: acute illness (COVID-19 infection) Signs/Symptoms: estimated needs Interventions: Magic cup,Ensure Enlive (each supplement provides 350kcal and 20 grams of protein),Tube feeding; unclogging protocol in place given that it was clogged this morning.  Pressure Injury Pressure Injury Sacrum Mid red open skin (Active)     Location: Sacrum  Location Orientation: Mid  Staging:   Wound Description (Comments): red open skin  Present on Admission: Yes   DVT prophylaxis: Heparin 5,000 units sq q8h Code Status: FULL CODE  Family  Communication: No family present at bedside but spoke to Carloyn Jaeger via Telephone Disposition Plan: SNF placement if her oral intake picks up and her tube feedings via he Cortrak have  been removed and PEG is placed; May need LTAC   Status is: Inpatient  Remains inpatient appropriate because:Unsafe d/c plan, IV treatments appropriate due to intensity of illness or inability to take PO and Inpatient level of care appropriate due to severity of illness   Dispo:  Patient From:    Planned Disposition:    Expected discharge date:02/13/2021  Medically stable for discharge:  No     Consultants:   Nephrology  Neurology  Palliative Care Medicine  Interventional Radiology  Discussed with Ethics Team Dr. Emeterio Reeve    Procedures: NG Placement   Antimicrobials:  Anti-infectives (From admission, onward)   Start     Dose/Rate Route Frequency Ordered Stop   02/18/2021 1200  ceFAZolin (ANCEF) IVPB 2g/100 mL premix        2 g 200 mL/hr over 30 Minutes Intravenous  Once 02/11/21 1052     01/07/21 0800  ceFEPIme (MAXIPIME) 2 g in sodium chloride 0.9 % 100 mL IVPB  Status:  Discontinued        2 g 200 mL/hr over 30 Minutes Intravenous Daily 01/06/21 1159 01/08/21 1257   01/05/21 1500  vancomycin (VANCOCIN) IVPB 1000 mg/200 mL premix  Status:  Discontinued        1,000 mg 200 mL/hr over 60 Minutes Intravenous Every 24 hours 01/05/21 1357 01/05/21 1414   01/05/21 1500  vancomycin (VANCOCIN) IVPB 1000 mg/200 mL premix  Status:  Discontinued        1,000 mg 200 mL/hr over 60 Minutes Intravenous Every 48 hours 01/05/21 1414 01/06/21 1146   01/05/21 1445  ceFEPIme (MAXIPIME) 2 g in sodium chloride 0.9 % 100 mL IVPB  Status:  Discontinued        2 g 200 mL/hr over 30 Minutes Intravenous Daily 01/05/21 1351 01/06/21 1146   01/03/21 1000  remdesivir 100 mg in sodium chloride 0.9 % 100 mL IVPB  Status:  Discontinued       "Followed by" Linked Group Details   100 mg 200 mL/hr over 30  Minutes Intravenous Daily 01/02/21 2257 01/04/21 1330   01/02/21 2300  remdesivir 200 mg in sodium chloride 0.9% 250 mL IVPB       "Followed by" Linked Group Details   200  mg 580 mL/hr over 30 Minutes Intravenous Once 01/02/21 2257 01/03/21 0147        Subjective: Seen and examined at bedside and was more sleepy today and and decreased responsiveness.  Nursing states that she was little hypoxic today but after Lasix she improved.  Nursing also stated that he Cortrak feeding tube got clogged.  Dietitian was consulted for further evaluation recommendations.  No other concerns or plans at this time.  Objective: Vitals:   02/11/21 0324 02/11/21 0500 02/11/21 0900 02/11/21 0929  BP: (!) 150/60  (!) 170/66 (!) 143/56  Pulse: 76  83 76  Resp: 20  (!) 30   Temp: 98.9 F (37.2 C)  99.1 F (37.3 C)   TempSrc: Oral  Rectal   SpO2: 97%  (!) 88% (!) 85%  Weight:  85.2 kg    Height:        Intake/Output Summary (Last 24 hours) at 02/11/2021 1126 Last data filed at 02/11/2021 0600 Gross per 24 hour  Intake 645 ml  Output 1200 ml  Net -555 ml   Filed Weights   02/08/21 0430 02/09/21 0500 02/11/21 0500  Weight: 84.9 kg 85.8 kg 85.2 kg   Examination: Physical Exam:  Constitutional: Patient is an overweight chronically ill-appearing African-American female currently who is not as responsive today as she was yesterday.  Remains extremely aphasic and responds to painful stimuli but not as alert as she was yesterday. Eyes: Lids appear normal. ENMT: External Ears, Nose appear normal. Cortrak Feeding Tube in Right Nare Neck: Appears normal, supple, no cervical masses, normal ROM, no appreciable thyromegaly; mild JVD Respiratory: Diminished to auscultation bilaterally with coarse breath sounds and some crackles.  Has a slightly increased respiratory rate and is tachypneic.  Wearing supplemental oxygen via nasal cannula now Cardiovascular: Mildly tachycardic but regular rate., no murmurs / rubs /  gallops. S1 and S2 auscultated.  1+ lower extremity edema Abdomen: Soft, non-tender, distended secondary body habitus. Bowel sounds positive.  GU: Deferred. Musculoskeletal: No clubbing / cyanosis of digits/nails. No joint deformity upper and lower extremities.  Skin: No rashes, lesions, ulcers limited skin evaluation. No induration; Warm and dry.  Neurologic: Patient is not as responsive as she was yesterday but responds to painful stimuli. Psychiatric: Impaired judgment and insight.  She is not alert and oriented x 3. Normal mood and appropriate affect.   Data Reviewed: I have personally reviewed following labs and imaging studies  CBC: Recent Labs  Lab 02/07/21 1222 02/08/21 0336 02/09/21 0409 02/10/21 0344 02/11/21 0407  WBC 4.1 4.8 5.2 5.5 7.3  NEUTROABS 2.8 3.3 3.6 3.8 5.6  HGB 7.3* 8.1* 8.3* 8.2* 8.7*  HCT 23.6* 25.7* 26.8* 25.5* 27.8*  MCV 93.7 92.1 94.0 91.1 93.6  PLT 117* 121* 110* 98* 892*   Basic Metabolic Panel: Recent Labs  Lab 02/07/21 0404 02/08/21 0336 02/09/21 0409 02/10/21 0344 02/11/21 0407  NA 137 136 137 135 138  K 4.2 3.9 4.0 3.9 4.0  CL 101 100 99 99 99  CO2 26 26 28 24 25   GLUCOSE 178* 135* 158* 151* 136*  BUN 100* 84* 105* 105* 103*  CREATININE 2.80* 2.82* 2.87* 2.85* 2.82*  CALCIUM 8.9 9.0 9.2 9.1 9.4  MG 2.1 2.2 2.4 2.3 2.5*  PHOS 4.2 4.3 4.7* 4.4 3.6   GFR: Estimated Creatinine Clearance: 19 mL/min (A) (by C-G formula based on SCr of 2.82 mg/dL (H)). Liver Function Tests: Recent Labs  Lab 02/07/21 0404 02/08/21 1194 02/09/21 0409 02/10/21 0344 02/11/21 0407  AST 69* 77* 89* 78* 69*  ALT 84* 93* 110* 103* 103*  ALKPHOS 249* 246* 261* 235* 283*  BILITOT 0.5 0.6 0.7 0.8 0.5  PROT 6.4* 6.8 6.6 6.5 7.3  ALBUMIN 2.4* 2.4* 2.4* 2.4* 2.6*   No results for input(s): LIPASE, AMYLASE in the last 168 hours. No results for input(s): AMMONIA in the last 168 hours. Coagulation Profile: No results for input(s): INR, PROTIME in the last 168  hours. Cardiac Enzymes: No results for input(s): CKTOTAL, CKMB, CKMBINDEX, TROPONINI in the last 168 hours. BNP (last 3 results) No results for input(s): PROBNP in the last 8760 hours. HbA1C: No results for input(s): HGBA1C in the last 72 hours. CBG: Recent Labs  Lab 02/10/21 1621 02/10/21 2002 02/10/21 2339 02/11/21 0352 02/11/21 0737  GLUCAP 169* 163* 152* 126* 134*   Lipid Profile: No results for input(s): CHOL, HDL, LDLCALC, TRIG, CHOLHDL, LDLDIRECT in the last 72 hours. Thyroid Function Tests: No results for input(s): TSH, T4TOTAL, FREET4, T3FREE, THYROIDAB in the last 72 hours. Anemia Panel: No results for input(s): VITAMINB12, FOLATE, FERRITIN, TIBC, IRON, RETICCTPCT in the last 72 hours. Sepsis Labs: No results for input(s): PROCALCITON, LATICACIDVEN in the last 168 hours.  No results found for this or any previous visit (from the past 240 hour(s)).   RN Pressure Injury Documentation: Pressure Injury Sacrum Mid red open skin (Active)     Location: Sacrum  Location Orientation: Mid  Staging:   Wound Description (Comments): red open skin  Present on Admission: Yes     Estimated body mass index is 29.42 kg/m as calculated from the following:   Height as of this encounter: 5\' 7"  (1.702 m).   Weight as of this encounter: 85.2 kg.  Malnutrition Type:  Nutrition Problem: Increased nutrient needs Etiology: acute illness (COVID-19 infection)   Malnutrition Characteristics:  Signs/Symptoms: estimated needs   Nutrition Interventions:  Interventions: Magic cup,Ensure Enlive (each supplement provides 350kcal and 20 grams of protein),Tube feeding   Radiology Studies: DG CHEST PORT 1 VIEW  Result Date: 02/11/2021 CLINICAL DATA:  Shortness of breath, history breast cancer, stroke, hypertension, diabetes mellitus EXAM: PORTABLE CHEST 1 VIEW COMPARISON:  Portable exam 0459 hours compared to 02/09/2021 FINDINGS: Feeding tube extends into abdomen. RIGHT subclavian  pacemaker with leads projecting over RIGHT atrium and RIGHT ventricle. Enlargement of cardiac silhouette with pulmonary vascular congestion. BILATERAL pulmonary infiltrates consistent with pulmonary edema. Bibasilar pleural effusions, small. No pneumothorax. Atherosclerotic calcification at aortic arch. Surgical clips at LEFT axilla. IMPRESSION: Persistent pulmonary edema and CHF. Aortic Atherosclerosis (ICD10-I70.0). Electronically Signed   By: Lavonia Dana M.D.   On: 02/11/2021 08:20   DG CHEST PORT 1 VIEW  Result Date: 02/09/2021 CLINICAL DATA:  The hip knee. EXAM: PORTABLE CHEST 1 VIEW COMPARISON:  January 05, 2021 FINDINGS: Feeding catheter transverses the thorax. Dual lead cardiac pacemaker in stable position. Enlarged cardiac silhouette. Calcific atherosclerotic disease of the aorta. Bilateral pleural effusions.  Mild interstitial pulmonary edema. Chest wall postsurgical changes. IMPRESSION: 1. Enlarged cardiac silhouette with mild interstitial pulmonary edema and bilateral pleural effusions. Electronically Signed   By: Fidela Salisbury M.D.   On: 02/09/2021 18:30   US Abdomen Limited RUQ (LIVER/GB)  Result Date: 02/09/2021 CLINICAL DATA:  Abnormal LFTs EXAM: ULTRASOUND ABDOMEN LIMITED RIGHT UPPER QUADRANT COMPARISON:  None. FINDINGS: Gallbladder: No gallstones or wall thickening visualized. No sonographic Murphy sign noted by sonographer. Common bile duct: Diameter: 3 mm Liver: No focal lesion identified. Within normal limits in parenchymal echogenicity. Portal vein is  patent on color Doppler imaging with normal direction of blood flow towards the liver. Other: Right-sided pleural effusion. IMPRESSION: 1. Unremarkable ultrasound of the right upper quadrant. 2. Right-sided pleural effusion. Electronically Signed   By: Dahlia Bailiff MD   On: 02/09/2021 15:45    Scheduled Meds: . atorvastatin  80 mg Per Tube Daily  . carvedilol  6.25 mg Per NG tube BID WC  . chlorhexidine  15 mL Mouth Rinse BID   . dorzolamide-timolol  1 drop Both Eyes BID  . ferrous sulfate  300 mg Per Tube QODAY  . free water  200 mL Per Tube QID  . furosemide  60 mg Intravenous BID  . hydrALAZINE  50 mg Per Tube Q8H  . insulin aspart  0-9 Units Subcutaneous Q4H  . insulin glargine  5 Units Subcutaneous BID  . ipratropium  0.5 mg Nebulization TID  . lactulose  20 g Per Tube BID  . levalbuterol  0.63 mg Nebulization TID  . mouth rinse  15 mL Mouth Rinse q12n4p  . nystatin  5 mL Mouth/Throat QID  . pantoprazole sodium  40 mg Per Tube Daily   Continuous Infusions: . [START ON February 25, 2021]  ceFAZolin (ANCEF) IV    . feeding supplement (NEPRO CARB STEADY) 50 mL/hr at 02/11/21 0303    LOS: 36 days   Kerney Elbe, DO [facility] PAGER is on [place]  If 7PM-7AM, please contact night-coverage [email or web address]

## 2021-02-12 ENCOUNTER — Inpatient Hospital Stay (HOSPITAL_COMMUNITY): Payer: Medicare HMO

## 2021-02-12 DIAGNOSIS — R9389 Abnormal findings on diagnostic imaging of other specified body structures: Secondary | ICD-10-CM | POA: Diagnosis not present

## 2021-02-12 DIAGNOSIS — R4701 Aphasia: Secondary | ICD-10-CM | POA: Diagnosis not present

## 2021-02-12 DIAGNOSIS — N179 Acute kidney failure, unspecified: Secondary | ICD-10-CM | POA: Diagnosis not present

## 2021-02-12 DIAGNOSIS — Z4659 Encounter for fitting and adjustment of other gastrointestinal appliance and device: Secondary | ICD-10-CM

## 2021-02-12 DIAGNOSIS — G9341 Metabolic encephalopathy: Secondary | ICD-10-CM | POA: Diagnosis not present

## 2021-02-12 HISTORY — PX: IR GASTROSTOMY TUBE MOD SED: IMG625

## 2021-02-12 LAB — CBC WITH DIFFERENTIAL/PLATELET
Abs Immature Granulocytes: 0.2 10*3/uL — ABNORMAL HIGH (ref 0.00–0.07)
Basophils Absolute: 0.1 10*3/uL (ref 0.0–0.1)
Basophils Relative: 2 %
Eosinophils Absolute: 0.1 10*3/uL (ref 0.0–0.5)
Eosinophils Relative: 2 %
HCT: 27 % — ABNORMAL LOW (ref 36.0–46.0)
Hemoglobin: 8.3 g/dL — ABNORMAL LOW (ref 12.0–15.0)
Immature Granulocytes: 4 %
Lymphocytes Relative: 12 %
Lymphs Abs: 0.7 10*3/uL (ref 0.7–4.0)
MCH: 29.9 pg (ref 26.0–34.0)
MCHC: 30.7 g/dL (ref 30.0–36.0)
MCV: 97.1 fL (ref 80.0–100.0)
Monocytes Absolute: 0.5 10*3/uL (ref 0.1–1.0)
Monocytes Relative: 10 %
Neutro Abs: 3.8 10*3/uL (ref 1.7–7.7)
Neutrophils Relative %: 70 %
Platelets: 111 10*3/uL — ABNORMAL LOW (ref 150–400)
RBC: 2.78 MIL/uL — ABNORMAL LOW (ref 3.87–5.11)
RDW: 16.3 % — ABNORMAL HIGH (ref 11.5–15.5)
WBC: 5.4 10*3/uL (ref 4.0–10.5)
nRBC: 0 % (ref 0.0–0.2)

## 2021-02-12 LAB — COMPREHENSIVE METABOLIC PANEL
ALT: 95 U/L — ABNORMAL HIGH (ref 0–44)
AST: 74 U/L — ABNORMAL HIGH (ref 15–41)
Albumin: 2.5 g/dL — ABNORMAL LOW (ref 3.5–5.0)
Alkaline Phosphatase: 232 U/L — ABNORMAL HIGH (ref 38–126)
Anion gap: 11 (ref 5–15)
BUN: 113 mg/dL — ABNORMAL HIGH (ref 8–23)
CO2: 26 mmol/L (ref 22–32)
Calcium: 9.1 mg/dL (ref 8.9–10.3)
Chloride: 100 mmol/L (ref 98–111)
Creatinine, Ser: 2.93 mg/dL — ABNORMAL HIGH (ref 0.44–1.00)
GFR, Estimated: 16 mL/min — ABNORMAL LOW (ref >60)
Glucose, Bld: 117 mg/dL — ABNORMAL HIGH (ref 70–99)
Potassium: 3.8 mmol/L (ref 3.5–5.1)
Sodium: 137 mmol/L (ref 135–145)
Total Bilirubin: 0.5 mg/dL (ref 0.3–1.2)
Total Protein: 6.8 g/dL (ref 6.5–8.1)

## 2021-02-12 LAB — GLUCOSE, CAPILLARY
Glucose-Capillary: 100 mg/dL — ABNORMAL HIGH (ref 70–99)
Glucose-Capillary: 110 mg/dL — ABNORMAL HIGH (ref 70–99)
Glucose-Capillary: 118 mg/dL — ABNORMAL HIGH (ref 70–99)
Glucose-Capillary: 64 mg/dL — ABNORMAL LOW (ref 70–99)
Glucose-Capillary: 89 mg/dL (ref 70–99)
Glucose-Capillary: 90 mg/dL (ref 70–99)
Glucose-Capillary: 97 mg/dL (ref 70–99)
Glucose-Capillary: 97 mg/dL (ref 70–99)

## 2021-02-12 LAB — PHOSPHORUS: Phosphorus: 5.6 mg/dL — ABNORMAL HIGH (ref 2.5–4.6)

## 2021-02-12 LAB — MAGNESIUM: Magnesium: 2.4 mg/dL (ref 1.7–2.4)

## 2021-02-12 LAB — PROTIME-INR
INR: 1.1 (ref 0.8–1.2)
Prothrombin Time: 13.5 seconds (ref 11.4–15.2)

## 2021-02-12 IMAGING — XA IR PERC PLACEMENT GASTROSTOMY
8 series · 15 of 24 positions shown · non-contrast
Comparison: none

INDICATION: 74-year-old female with history of stroke and dysphagia.

[Series 1: fl - angio · 2 of 19 frames shown (1 of 7)]
[frame 3/19]
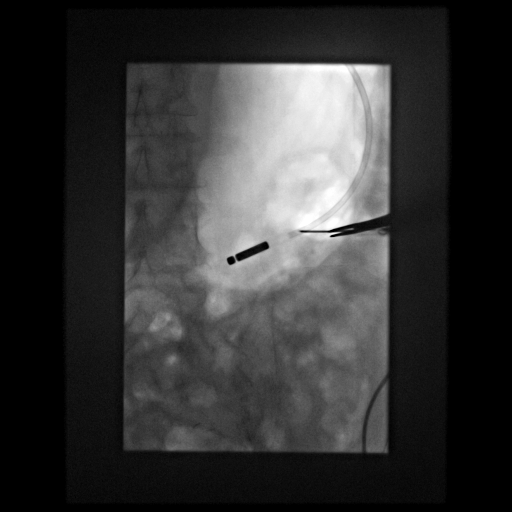
[frame 17/19]
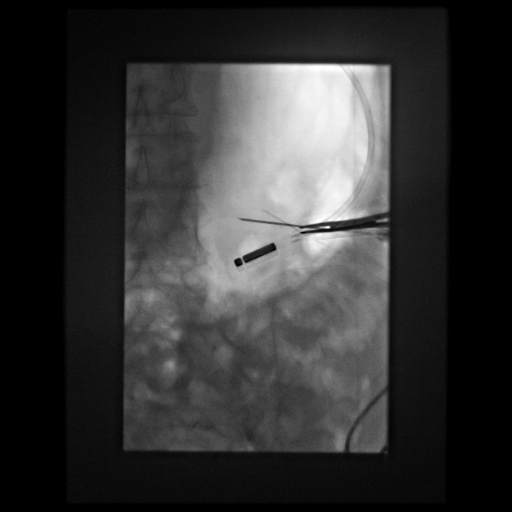

[Series 2: fl - angio · 2 of 9 frames shown (2 of 7)]
[frame 4/9]
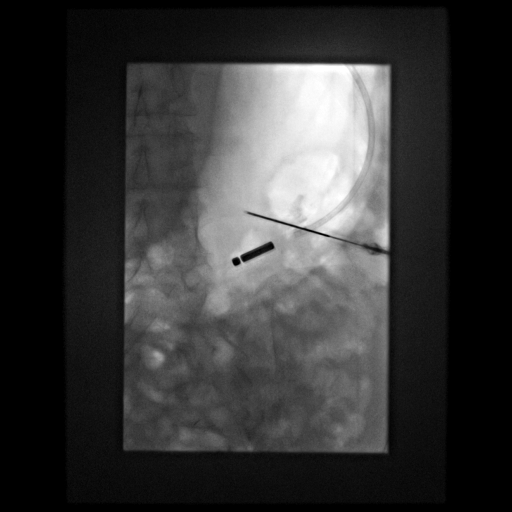
[frame 5/9]
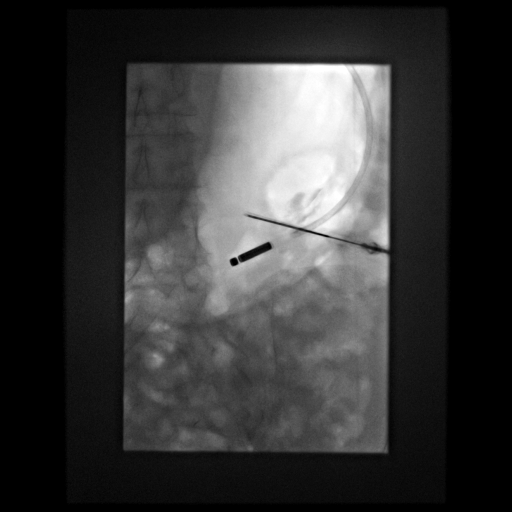

[Series 3: fl - angio · 2 of 10 frames shown (3 of 7)]
[frame 6/10]
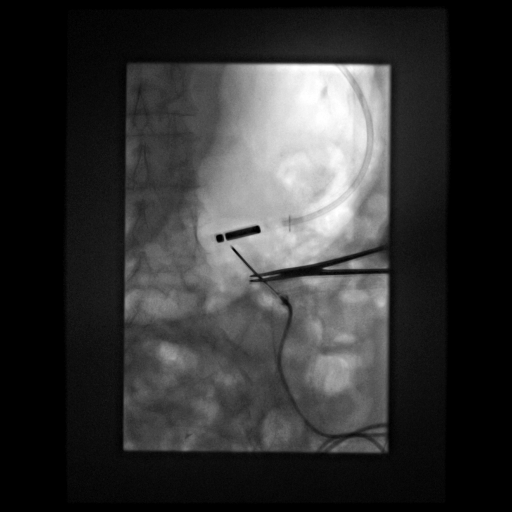
[frame 9/10]
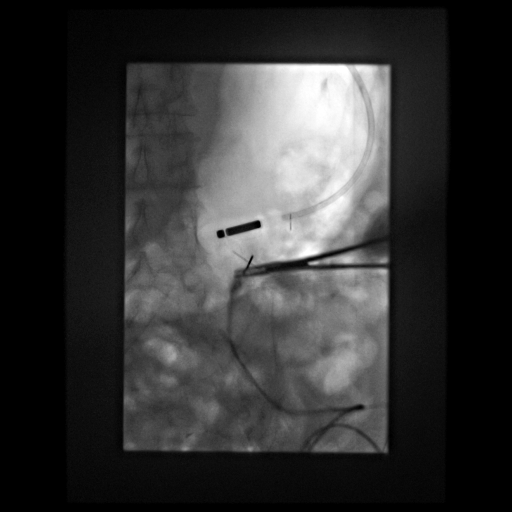

[Series 4: fl - angio · 1 of 12 frames shown (4 of 7)]
[frame 10/12]
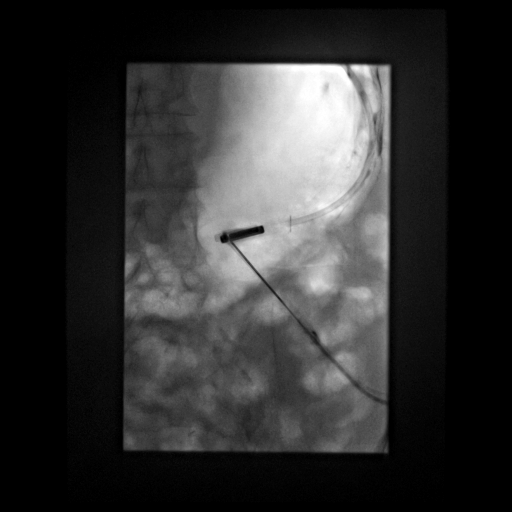

[Series 5: fl - angio · 2 of 14 frames shown (5 of 7)]
[frame 3/14]
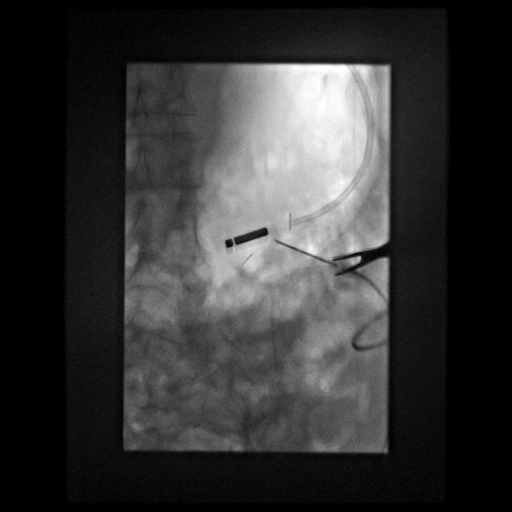
[frame 8/14]
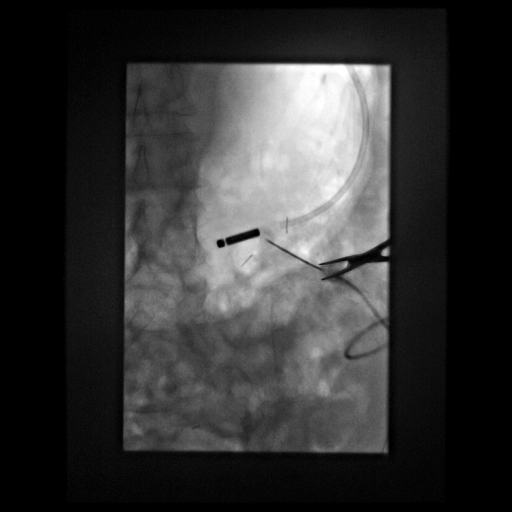

[Series 6: fl - angio · 2 of 7 frames shown (6 of 7)]
[frame 2/7]
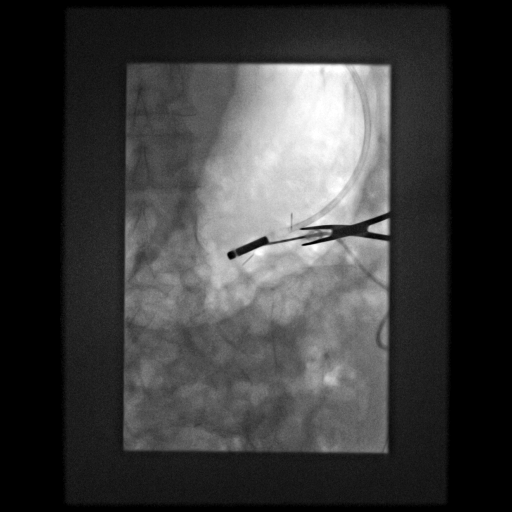
[frame 4/7]
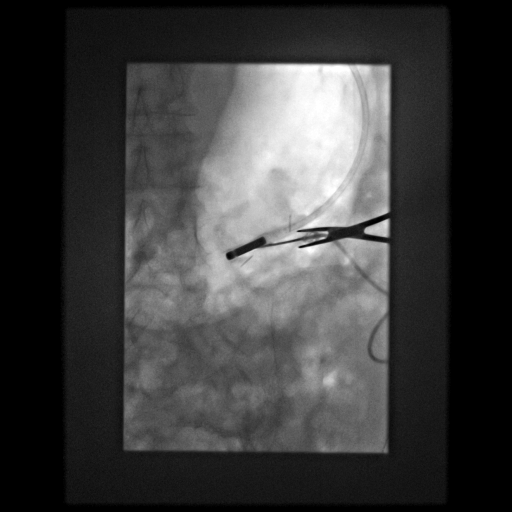

[Series 8: fl - angio · 3 of 15 frames shown (7 of 7)]
[frame 3/15]
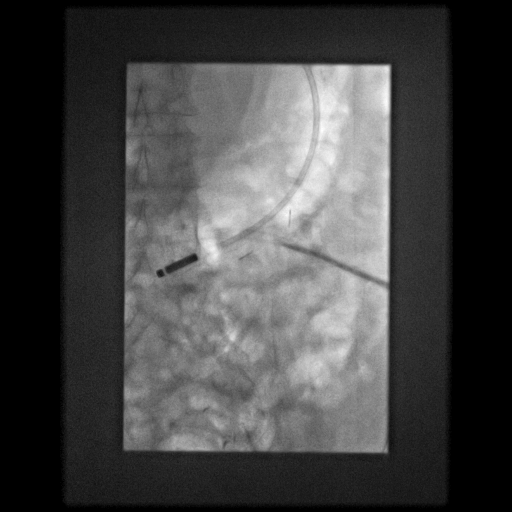
[frame 8/15]
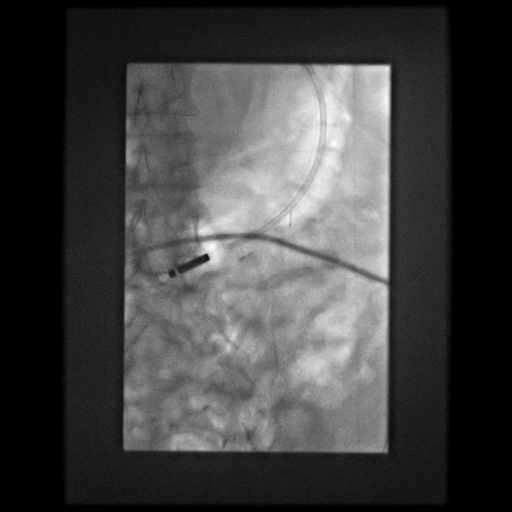
[frame 13/15]
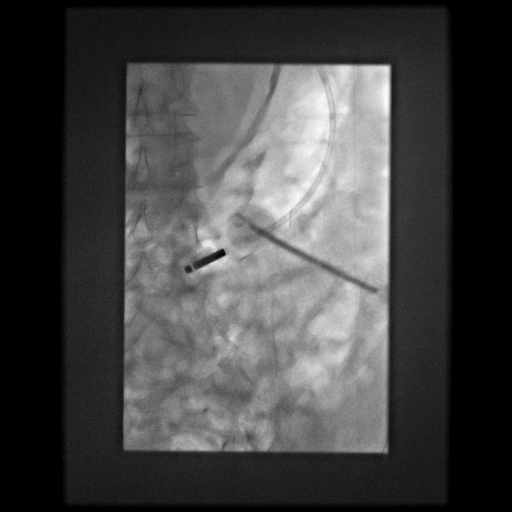

[Series 300: tube placements · 1 of 3 slices shown]
[im 3/3]
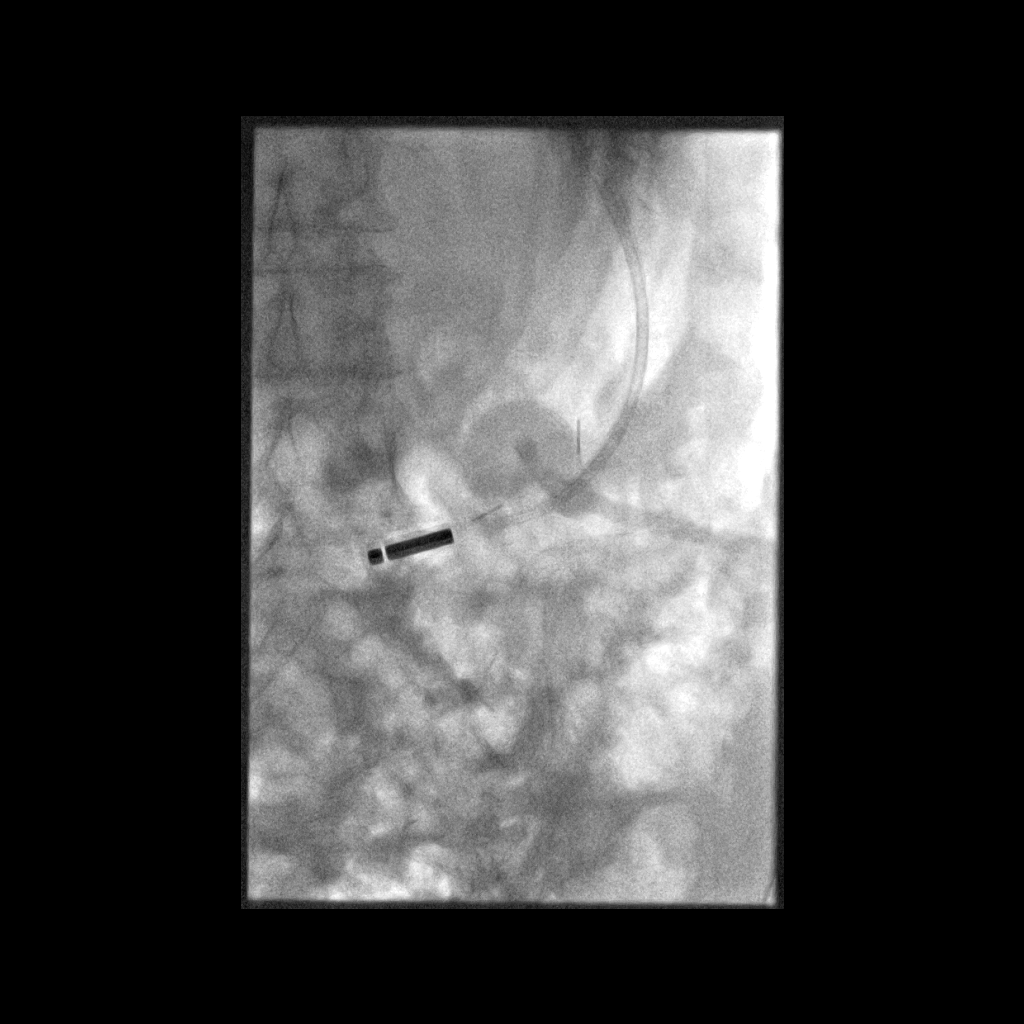

[15 of 24 positions shown; findings below may reference images not displayed]

EXAM:
PERC PLACEMENT GASTROSTOMY

MEDICATIONS:
Ancef 2 gm IV; Antibiotics were administered within 1 hour of the
procedure.

ANESTHESIA/SEDATION:
Versed 0 mg IV; Fentanyl 0 mcg IV

Moderate Sedation Time:  0

The patient was continuously monitored during the procedure by the
interventional radiology nurse under my direct supervision.

CONTRAST:  15mL OMNIPAQUE IOHEXOL 300 MG/ML SOLN - administered into
the gastric lumen.

FLUOROSCOPY TIME:  Fluoroscopy Time: 0 minutes 18 seconds (3 mGy).

COMPLICATIONS:
None immediate.

PROCEDURE:
Informed written consent was obtained from the patient after a
thorough discussion of the procedural risks, benefits and
alternatives. All questions were addressed. Maximal Sterile Barrier
Technique was utilized including caps, mask, sterile gowns, sterile
gloves, sterile drape, hand hygiene and skin antiseptic. A timeout
was performed prior to the initiation of the procedure.

The patient was placed on the procedure table in the supine
position. Pre-procedure abdominal film confirmed visualization of
the transverse colon. The stomach was insufflated with air via the
indwelling nasogastric tube. Under fluoroscopy, a puncture site was
selected and local analgesia achieved with 1% lidocaine infiltrated
subcutaneously.

Under fluoroscopic guidance, a gastropexy needle was passed into the
stomach and the T-bar suture was released. Entry into the stomach
was confirmed with fluoroscopy, aspiration of air, and injection of
contrast material. This was repeated with an additional gastropexy
suture (for a total of 2 fasteners). At the center of these
gastropexy sutures, a dermatotomy was performed. An 18 gauge needle
was passed into the stomach at

the site of this dermatotomy, and position within the gastric lumen
again confirmed under fluoroscopy using aspiration of air and
contrast injection. An Amplatz guidewire was passed through this
needle and intraluminal placement within the stomach was

confirmed by fluoroscopy. The needle was removed. Over the
guidewire, the percutaneous tract was dilated using a 10 mm
non-compliant balloon. The balloon was deflated, then pushed into
the gastric lumen followed in concert by the 20 Fr gastrostomy tube.

The retention balloon of the percutaneous gastrostomy tube

was inflated with 20 mL of sterile water. The tube was withdrawn
until the retention balloon was at the edge of the gastric lumen.
The external bumper was brought to the

abdominal wall. Contrast was injected through the gastrostomy tube,
confirming intraluminal positioning.

The patient tolerated the procedure well without any immediate
post-procedural complications.
IMPRESSION: Technically successful placement of 20 Fr gastrostomy tube with
absorbable gastropexy sutures.

## 2021-02-12 IMAGING — DX DG CHEST 1V PORT
1 series · 1 of 1 positions shown · non-contrast
Comparison: [DATE].

CLINICAL DATA: Shortness of breath.

EXAM:
PORTABLE CHEST 1 VIEW

[chest ap]
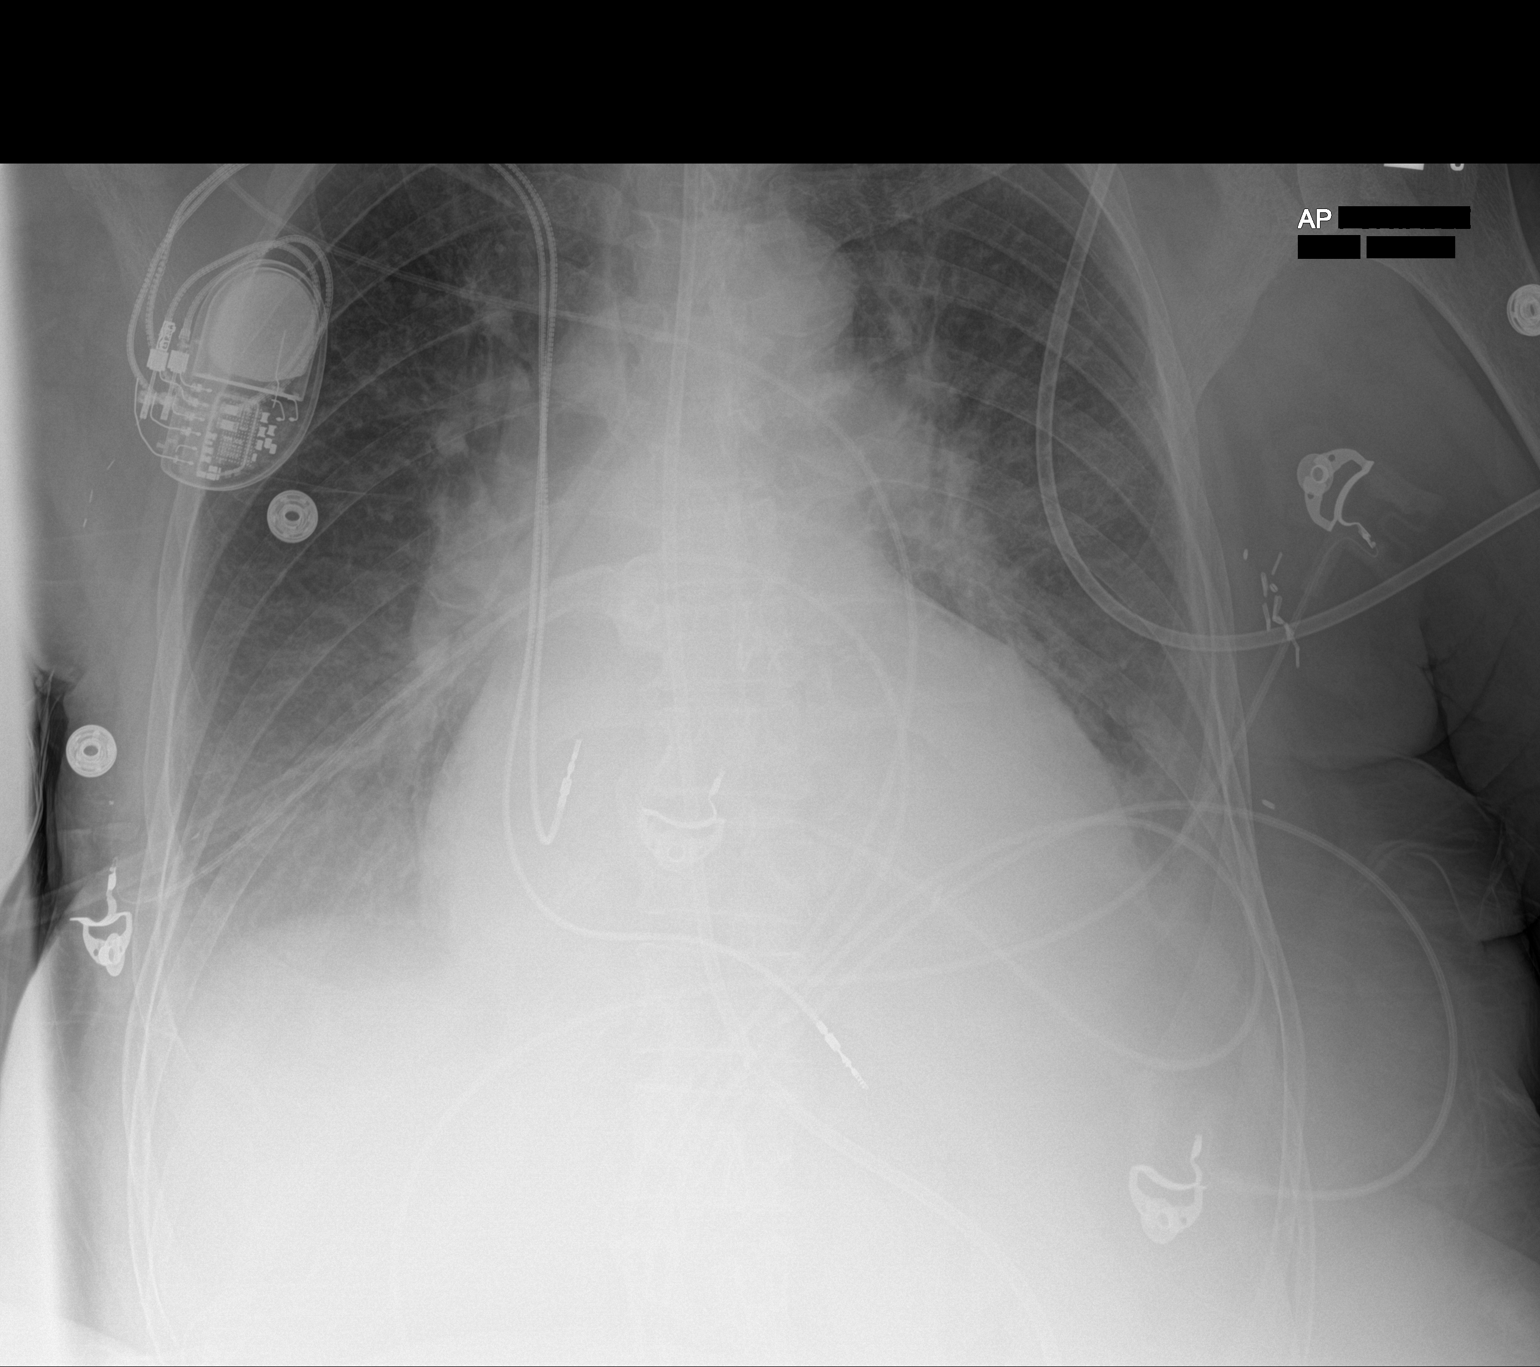

[1 of 1 positions shown; findings below may reference images not displayed]

FINDINGS: Feeding tube noted with tip below left hemidiaphragm. Cardiac pacer
stable position. Cardiomegaly with diffuse bilateral interstitial
prominence and small pleural effusions again noted. Findings most
consistent with CHF. Pneumonitis cannot be excluded. No interim
change. No pneumothorax. Surgical clips left chest.
IMPRESSION: Cardiac pacer in stable position. Cardiomegaly with diffuse
bilateral interstitial prominence and small pleural effusions again
noted. Findings most consistent with CHF. Similar findings noted on
prior exam.

## 2021-02-12 MED ORDER — LEVALBUTEROL HCL 0.63 MG/3ML IN NEBU: 0.6300 mg | INHALATION_SOLUTION | Freq: Four times a day (QID) | RESPIRATORY_TRACT | Status: DC | PRN

## 2021-02-12 MED ORDER — IOHEXOL 300 MG/ML  SOLN
50.0000 mL | Freq: Once | INTRAMUSCULAR | Status: AC | PRN
  Administered 2021-02-12: 15 mL

## 2021-02-12 MED ORDER — DEXTROSE 50 % IV SOLN
12.5000 g | INTRAVENOUS | Status: AC
  Administered 2021-02-12: 12.5 g via INTRAVENOUS

## 2021-02-12 MED ORDER — LIDOCAINE-EPINEPHRINE 1 %-1:100000 IJ SOLN
INTRAMUSCULAR | Status: AC
  Filled 2021-02-12: qty 1

## 2021-02-12 MED ORDER — LIDOCAINE-EPINEPHRINE 1 %-1:100000 IJ SOLN
INTRAMUSCULAR | Status: AC | PRN
  Administered 2021-02-12: 10 mL

## 2021-02-12 MED ORDER — MIDAZOLAM HCL 2 MG/2ML IJ SOLN
INTRAMUSCULAR | Status: AC
  Filled 2021-02-12: qty 2

## 2021-02-12 MED ORDER — GLUCAGON HCL RDNA (DIAGNOSTIC) 1 MG IJ SOLR
INTRAMUSCULAR | Status: AC
  Filled 2021-02-12: qty 1

## 2021-02-12 MED ORDER — FENTANYL CITRATE (PF) 100 MCG/2ML IJ SOLN
INTRAMUSCULAR | Status: AC
  Filled 2021-02-12: qty 2

## 2021-02-12 MED ORDER — IPRATROPIUM BROMIDE 0.02 % IN SOLN: 0.5000 mg | Freq: Four times a day (QID) | RESPIRATORY_TRACT | Status: DC | PRN

## 2021-02-12 MED ORDER — DEXTROSE 50 % IV SOLN
INTRAVENOUS | Status: AC
  Filled 2021-02-12: qty 50

## 2021-02-13 DIAGNOSIS — R1310 Dysphagia, unspecified: Secondary | ICD-10-CM | POA: Diagnosis not present

## 2021-02-13 DIAGNOSIS — G9341 Metabolic encephalopathy: Secondary | ICD-10-CM | POA: Diagnosis not present

## 2021-02-13 DIAGNOSIS — N179 Acute kidney failure, unspecified: Secondary | ICD-10-CM | POA: Diagnosis not present

## 2021-02-13 LAB — COMPREHENSIVE METABOLIC PANEL
ALT: 82 U/L — ABNORMAL HIGH (ref 0–44)
AST: 66 U/L — ABNORMAL HIGH (ref 15–41)
Albumin: 2.6 g/dL — ABNORMAL LOW (ref 3.5–5.0)
Alkaline Phosphatase: 209 U/L — ABNORMAL HIGH (ref 38–126)
Anion gap: 10 (ref 5–15)
BUN: 112 mg/dL — ABNORMAL HIGH (ref 8–23)
CO2: 28 mmol/L (ref 22–32)
Calcium: 9.1 mg/dL (ref 8.9–10.3)
Chloride: 103 mmol/L (ref 98–111)
Creatinine, Ser: 3.02 mg/dL — ABNORMAL HIGH (ref 0.44–1.00)
GFR, Estimated: 15 mL/min — ABNORMAL LOW (ref >60)
Glucose, Bld: 94 mg/dL (ref 70–99)
Potassium: 4 mmol/L (ref 3.5–5.1)
Sodium: 141 mmol/L (ref 135–145)
Total Bilirubin: 0.7 mg/dL (ref 0.3–1.2)
Total Protein: 6.8 g/dL (ref 6.5–8.1)

## 2021-02-13 LAB — CBC
HCT: 27.3 % — ABNORMAL LOW (ref 36.0–46.0)
Hemoglobin: 8.2 g/dL — ABNORMAL LOW (ref 12.0–15.0)
MCH: 29.2 pg (ref 26.0–34.0)
MCHC: 30 g/dL (ref 30.0–36.0)
MCV: 97.2 fL (ref 80.0–100.0)
Platelets: 118 10*3/uL — ABNORMAL LOW (ref 150–400)
RBC: 2.81 MIL/uL — ABNORMAL LOW (ref 3.87–5.11)
RDW: 15.7 % — ABNORMAL HIGH (ref 11.5–15.5)
WBC: 6.9 10*3/uL (ref 4.0–10.5)
nRBC: 0 % (ref 0.0–0.2)

## 2021-02-13 LAB — GLUCOSE, CAPILLARY
Glucose-Capillary: 103 mg/dL — ABNORMAL HIGH (ref 70–99)
Glucose-Capillary: 120 mg/dL — ABNORMAL HIGH (ref 70–99)
Glucose-Capillary: 124 mg/dL — ABNORMAL HIGH (ref 70–99)
Glucose-Capillary: 133 mg/dL — ABNORMAL HIGH (ref 70–99)
Glucose-Capillary: 85 mg/dL (ref 70–99)
Glucose-Capillary: 87 mg/dL (ref 70–99)
Glucose-Capillary: 87 mg/dL (ref 70–99)

## 2021-02-13 NOTE — Progress Notes (Signed)
[facility]  PROGRESS NOTE  Desiree Pollard TOI:[id number] DOB: 1946-04-11 DOA: 01/02/2021 PCP: [name], FNP   Brief HPI:   75 year old female with history of CVA with left hemiparesis, expressive aphasia, dementia, CKD stage IV, complete heart block status post pacemaker placement who was admitted from skilled nursing facility with hyper natremia, COVID-19 infection encephalopathy.  MRI was negative.  Hospital course was complicated by acute kidney injury and dysphagia.  Nephrology did not feel that patient was a hemodialysis candidate.  NG tube was placed but patient pulled out twice and was replaced for 3rd time.  Plan for PEG tube placement today.  Patient sister is trying to obtain healthcare power of attorney status.  She has decided to get PEG tube.  Patient's renal function has been worsening in the hospital.  As above she is not a HD candidate.  Patient is on furosemide 60 mg IV twice daily.    Subjective   Patient seen and examined, s/p PEG tube placement yesterday.  Tube feeding started this morning.  No change in mental status.  BUN still elevated under 12.   Assessment/Plan:     1. Acute metabolic encephalopathy-likely multifactorial from worsening renal function.  Patient's BUN is steadily rising, 113.  Concerning for uremia.  TSH, B12, EEG, CT head, MRI brain were negative for acute abnormality.  Patient is not a hemodialysis candidate due to multiple comorbidities as per nephrology. 2. Oropharyngeal dysphagia-NG tube was placed twice which patient pulled out.  Feeding tube has been placed for 3rd time.  She is on dysphagia one diet, not been eating due to the worsening mental status.  Patient sister has requested PEG tube placement, palliative care was consulted however patient sister does not want anymore palliative care discussion. 3. Nonoliguric acute kidney injury on CKD stage IV-patient has progressive azotemia, possible uremia.  BUN/creatinine is slightly  trending up.  Today BUN is 112.  Renal ultrasound showed no finding.  Patient not hemodialysis candidate per nephrology.  Started on Lasix 60 mg IV twice daily.  Called and discussed with nephrology, Dr. Moshe Cipro, will hold Lasix at this time.  She will see patient in AM. 4. Pancytopenia-patient has pancytopenia with leukopenia and thrombocytopenia.  FOBT was negative.  Anemia panel was consistent with anemia of chronic disease.  Likely from malnutrition.  Hemoglobin is stable at 8.3. 5. Anasarca-secondary to hypoalbuminemia-nephrology does not recommend giving albumin.  Continue Lasix as above. 6. Hypothermia-patient had episode of hypothermia with temperature 94.1 2 days ago.  She was applied [employer], temperature has improved.  Now stable. 7. COVID-19 infection-she tested positive on 01/02/2021.  Received remdesivir x3 doses.  No indication for airborne isolation. 8. Hypertension-continue Coreg 6.25 mg p.o. twice daily, hydralazine 50 mg 3 times daily. 9. Acute on chronic diastolic CHF-patient is currently requiring 2 L/min of oxygen by nasal cannula.  Chest x-ray done 2 days ago still shows pulmonary edema.  Continue IV Lasix as above. 10. History of CVA-patient has history of CVA in October 2021 with residual left hemiparesis and expressive aphasia.  CT head and MRI brain showed no acute finding. 46. Transaminitis-patient has mild transaminitis, which is slowly improving.  Right upper quadrant ultrasound was unremarkable.  Hepatitis panel is pending. 12. Goals of care-discussed with patient's sister Ms. Cheek at bedside.  Discussed in detail regarding patient's condition and worsening azotemia.  Also discussed poor prognosis considering persistent lethargy, encephalopathy.  Patient is going for PEG tube placement today, and sister is hopeful that  she might improve after she receives nutrition.  I discussed in detail need for intubation and mechanical ventilation in case patient develops  acute respiratory failure, also discussed CPR in the event of cardiac arrest.  Also discussed the futility of these measures considering patient's worsening clinical condition.  Patient sister agrees for DNR.  Also patient sister had questions on why she is not a hemodialysis candidate.  I have called Dr. Moshe Cipro to update patient sister regarding questions on hemodialysis.     COVID-19 Labs  No results for input(s): DDIMER, FERRITIN, LDH, CRP in the last 72 hours.  Lab Results  Component Value Date   SARSCOV2NAA POSITIVE (A) 01/02/2021   [place] NEGATIVE 11/24/2020   [place] NEGATIVE 10/28/2020   [place] NEGATIVE 10/25/2020     Scheduled medications:   . atorvastatin  80 mg Per Tube Daily  . carvedilol  6.25 mg Per NG tube BID WC  . chlorhexidine  15 mL Mouth Rinse BID  . dorzolamide-timolol  1 drop Both Eyes BID  . ferrous sulfate  300 mg Per Tube QODAY  . free water  200 mL Per Tube QID  . hydrALAZINE  50 mg Per Tube Q8H  . insulin aspart  0-9 Units Subcutaneous Q4H  . insulin glargine  5 Units Subcutaneous BID  . lactulose  20 g Per Tube BID  . lipase/protease/amylase)  20,880 Units Per Tube Once  . mouth rinse  15 mL Mouth Rinse q12n4p  . nystatin  5 mL Mouth/Throat QID  . pantoprazole sodium  40 mg Per Tube Daily  . sodium bicarbonate  650 mg Per Tube Once         CBG: Recent Labs  Lab 02/20/21 2338 02/13/21 0056 02/13/21 0336 02/13/21 0756 02/13/21 1157  GLUCAP 89 87 85 87 103*    SpO2: 100 % O2 Flow Rate (L/min): 2 L/min    CBC: Recent Labs  Lab 02/08/21 0336 02/09/21 0409 02/10/21 0344 02/11/21 0407 02/20/21 0348 02/13/21 0400  WBC 4.8 5.2 5.5 7.3 5.4 6.9  NEUTROABS 3.3 3.6 3.8 5.6 3.8  --   HGB 8.1* 8.3* 8.2* 8.7* 8.3* 8.2*  HCT 25.7* 26.8* 25.5* 27.8* 27.0* 27.3*  MCV 92.1 94.0 91.1 93.6 97.1 97.2  PLT 121* 110* 98* 122* 111* 118*    Basic Metabolic Panel: Recent Labs  Lab 02/08/21 0336 02/09/21 0409  02/10/21 0344 02/11/21 0407 02-20-21 0348 02/13/21 0400  NA 136 137 135 138 137 141  K 3.9 4.0 3.9 4.0 3.8 4.0  CL 100 99 99 99 100 103  CO2 26 28 24 25 26 28   GLUCOSE 135* 158* 151* 136* 117* 94  BUN 84* 105* 105* 103* 113* 112*  CREATININE 2.82* 2.87* 2.85* 2.82* 2.93* 3.02*  CALCIUM 9.0 9.2 9.1 9.4 9.1 9.1  MG 2.2 2.4 2.3 2.5* 2.4  --   PHOS 4.3 4.7* 4.4 3.6 5.6*  --      Liver Function Tests: Recent Labs  Lab 02/09/21 0409 02/10/21 0344 02/11/21 0407 02-20-2021 0348 02/13/21 0400  AST 89* 78* 69* 74* 66*  ALT 110* 103* 103* 95* 82*  ALKPHOS 261* 235* 283* 232* 209*  BILITOT 0.7 0.8 0.5 0.5 0.7  PROT 6.6 6.5 7.3 6.8 6.8  ALBUMIN 2.4* 2.4* 2.6* 2.5* 2.6*     Antibiotics: Anti-infectives (From admission, onward)   Start     Dose/Rate Route Frequency Ordered Stop   02-20-21 1200  ceFAZolin (ANCEF) IVPB 2g/100 mL premix        2  g 200 mL/hr over 30 Minutes Intravenous  Once 02/11/21 1052 February 18, 2021 1333   01/07/21 0800  ceFEPIme (MAXIPIME) 2 g in sodium chloride 0.9 % 100 mL IVPB  Status:  Discontinued        2 g 200 mL/hr over 30 Minutes Intravenous Daily 01/06/21 1159 01/08/21 1257   01/05/21 1500  vancomycin (VANCOCIN) IVPB 1000 mg/200 mL premix  Status:  Discontinued        1,000 mg 200 mL/hr over 60 Minutes Intravenous Every 24 hours 01/05/21 1357 01/05/21 1414   01/05/21 1500  vancomycin (VANCOCIN) IVPB 1000 mg/200 mL premix  Status:  Discontinued        1,000 mg 200 mL/hr over 60 Minutes Intravenous Every 48 hours 01/05/21 1414 01/06/21 1146   01/05/21 1445  ceFEPIme (MAXIPIME) 2 g in sodium chloride 0.9 % 100 mL IVPB  Status:  Discontinued        2 g 200 mL/hr over 30 Minutes Intravenous Daily 01/05/21 1351 01/06/21 1146   01/03/21 1000  remdesivir 100 mg in sodium chloride 0.9 % 100 mL IVPB  Status:  Discontinued       "Followed by" Linked Group Details   100 mg 200 mL/hr over 30 Minutes Intravenous Daily 01/02/21 2257 01/04/21 1330   01/02/21 2300   remdesivir 200 mg in sodium chloride 0.9% 250 mL IVPB       "Followed by" Linked Group Details   200 mg 580 mL/hr over 30 Minutes Intravenous Once 01/02/21 2257 01/03/21 0147       DVT prophylaxis: Heparin  Code Status: Full code  Family Communication: Discussed with patient's sister at bedside   Consultants:    Procedures:      Objective   Vitals:   02/13/21 0341 02/13/21 0419 02/13/21 0548 02/13/21 1020  BP: (!) 166/60 (!) 151/55 (!) 158/65 (!) 159/65  Pulse: 68 68  73  Resp: 20   14  Temp: (!) 97.5 F (36.4 C)     TempSrc:      SpO2: 100%   100%  Weight:      Height:        Intake/Output Summary (Last 24 hours) at 02/13/2021 1341 Last data filed at 02/13/2021 0300 Gross per 24 hour  Intake 100 ml  Output 1200 ml  Net -1100 ml    03/01 1901 - 03/03 0700 In: 350  Out: 1700 [Urine:1700]  Filed Weights   02/11/21 0500 02-18-2021 0500 02/13/21 0258  Weight: 85.2 kg 83.3 kg 82.6 kg    Physical Examination:   General-somnolent Heart-S1-S2, regular, no murmur auscultated Lungs-clear to auscultation bilaterally, no wheezing or crackles auscultated Abdomen-soft, nontender, no organomegaly Extremities-no edema in the lower extremities Neuro-somnolent, not following commands  Status is: Inpatient  Dispo: The patient is from: Home              Anticipated d/c is to: Skilled nursing facility              Anticipated d/c date is: 02/17/2021              Patient currently not stable for discharge  Barrier to discharge-ongoing management for encephalopathy, poor p.o. intake  Pressure Injury Sacrum Mid red open skin (Active)     Location: Sacrum  Location Orientation: Mid  Staging:   Wound Description (Comments): red open skin  Present on Admission: Yes           Data Reviewed:   No results found for this or any previous  visit (from the past 240 hour(s)).  No results for input(s): LIPASE, AMYLASE in the last 168 hours. No results for input(s):  AMMONIA in the last 168 hours.  Cardiac Enzymes: No results for input(s): CKTOTAL, CKMB, CKMBINDEX, TROPONINI in the last 168 hours. BNP (last 3 results) Recent Labs    10/19/20 0001 01/02/21 1947  BNP 321.9* 536.1*    ProBNP (last 3 results) No results for input(s): PROBNP in the last 8760 hours.  Studies:  IR GASTROSTOMY TUBE MOD SED  Result Date: 03/01/2021 INDICATION: 75 year old female with history of stroke and dysphagia. EXAM: PERC PLACEMENT GASTROSTOMY MEDICATIONS: Ancef 2 gm IV; Antibiotics were administered within 1 hour of the procedure. ANESTHESIA/SEDATION: Versed 0 mg IV; Fentanyl 0 mcg IV Moderate Sedation Time:  0 The patient was continuously monitored during the procedure by the interventional radiology nurse under my direct supervision. CONTRAST:  68mL OMNIPAQUE IOHEXOL 300 MG/ML SOLN - administered into the gastric lumen. FLUOROSCOPY TIME:  Fluoroscopy Time: 0 minutes 18 seconds (3 mGy). COMPLICATIONS: None immediate. PROCEDURE: Informed written consent was obtained from the patient after a thorough discussion of the procedural risks, benefits and alternatives. All questions were addressed. Maximal Sterile Barrier Technique was utilized including caps, mask, sterile gowns, sterile gloves, sterile drape, hand hygiene and skin antiseptic. A timeout was performed prior to the initiation of the procedure. The patient was placed on the procedure table in the supine position. Pre-procedure abdominal film confirmed visualization of the transverse colon. The stomach was insufflated with air via the indwelling nasogastric tube. Under fluoroscopy, a puncture site was selected and local analgesia achieved with 1% lidocaine infiltrated subcutaneously. Under fluoroscopic guidance, a gastropexy needle was passed into the stomach and the T-bar suture was released. Entry into the stomach was confirmed with fluoroscopy, aspiration of air, and injection of contrast material. This was repeated with  an additional gastropexy suture (for a total of 2 fasteners). At the center of these gastropexy sutures, a dermatotomy was performed. An 18 gauge needle was passed into the stomach at the site of this dermatotomy, and position within the gastric lumen again confirmed under fluoroscopy using aspiration of air and contrast injection. An Amplatz guidewire was passed through this needle and intraluminal placement within the stomach was confirmed by fluoroscopy. The needle was removed. Over the guidewire, the percutaneous tract was dilated using a 10 mm non-compliant balloon. The balloon was deflated, then pushed into the gastric lumen followed in concert by the 20 Fr gastrostomy tube. The retention balloon of the percutaneous gastrostomy tube was inflated with 20 mL of sterile water. The tube was withdrawn until the retention balloon was at the edge of the gastric lumen. The external bumper was brought to the abdominal wall. Contrast was injected through the gastrostomy tube, confirming intraluminal positioning. The patient tolerated the procedure well without any immediate post-procedural complications. IMPRESSION: Technically successful placement of 20 Fr gastrostomy tube with absorbable gastropexy sutures. Ruthann Cancer, MD [facility] Radiology Electronically Signed   By: Ruthann Cancer MD   On: 03-01-21 17:08   DG CHEST PORT 1 VIEW  Result Date: 03-01-2021 CLINICAL DATA:  Shortness of breath. EXAM: PORTABLE CHEST 1 VIEW COMPARISON:  02/11/2021. FINDINGS: Feeding tube noted with tip below left hemidiaphragm. Cardiac pacer stable position. Cardiomegaly with diffuse bilateral interstitial prominence and small pleural effusions again noted. Findings most consistent with CHF. Pneumonitis cannot be excluded. No interim change. No pneumothorax. Surgical clips left chest. IMPRESSION: Cardiac pacer in stable position. Cardiomegaly  with diffuse bilateral interstitial  prominence and small pleural effusions again noted. Findings most consistent with CHF. Similar findings noted on prior exam. Electronically Signed   By: [place]   On: March 09, 2021 05:38       Oswald Hillock   [facility] If 7PM-7AM, please contact night-coverage at [email or web address], Office  [phone]   02/13/2021, 1:41 PM  LOS: 41 days

## 2021-02-13 NOTE — Plan of Care (Signed)
  Problem: Clinical Measurements: Goal: Will remain free from infection Outcome: Progressing Goal: Respiratory complications will improve Outcome: Progressing   Problem: Nutrition: Goal: Adequate nutrition will be maintained Outcome: Progressing   Problem: Elimination: Goal: Will not experience complications related to bowel motility Outcome: Progressing

## 2021-02-13 NOTE — NC FL2 (Addendum)
[place] LEVEL OF CARE SCREENING TOOL     IDENTIFICATION  Patient Name: Desiree Pollard Birthdate: 1946-08-03 Sex: female Admission Date (Current Location): 01/02/2021  [facility] and [place] Number:  Herbalist and Address:  [facility],  [place], [place]      Provider Number: [id number]  Attending Physician Name and Address:  Oswald Hillock, MD  Relative Name and Phone Number:  cheek,christine Sister   [phone] wash,katherine Sister   [phone]    Current Level of Care: Hospital Recommended Level of Care: [place] Prior Approval Number:    Date Approved/Denied:   PASRR Number: [id number]  Discharge Plan: SNF    Current Diagnoses: Patient Active Problem List   Diagnosis Date Noted  . COVID-19 virus infection 01/02/2021  . Hypernatremia 01/02/2021  . 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 CKD (chronic kidney disease), stage IV ([place]) 11/26/2020  . Acute metabolic encephalopathy 09/32/6712  . Generalized weakness 11/24/2020  . Acute lower UTI 11/24/2020  . Hypothermia 11/24/2020  . ARF (acute renal failure) ([place]) 10/19/2020  . Dizziness 10/19/2020  . Thrombocytopenia ([place]) 10/19/2020  . Chronic diarrhea 09/18/2020  . Loss of weight 09/18/2020  . Dysphagia 09/18/2020  . 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 Hemoglobin A1c less than 7.0% 06/16/2020  . Hyperglycemia 06/16/2020  . Hypertension 06/16/2020  . History of stroke 06/16/2020    Orientation RESPIRATION BLADDER Height & Weight     Self  O2 2L Incontinent Weight: 182 lb 1.6 oz (82.6 kg) Height:  5\' 7"  (170.2 cm)  BEHAVIORAL SYMPTOMS/MOOD NEUROLOGICAL BOWEL NUTRITION STATUS      Incontinent Feeding tube  AMBULATORY STATUS COMMUNICATION OF NEEDS Skin   Limited Assist Verbally PU Stage and Appropriate Care (Prn dressing change)                       Personal Care Assistance Level of  Assistance  Bathing,Feeding,Dressing Bathing Assistance: Limited assistance Feeding assistance: Limited assistance Dressing Assistance: Limited assistance     Functional Limitations Info  Sight,Hearing,Speech Sight Info: Adequate Hearing Info: Adequate Speech Info: Adequate    SPECIAL CARE FACTORS FREQUENCY  PT (By licensed PT),OT (By licensed OT)     PT Frequency: Minimum 5x a week OT Frequency: Minimum 5x a week            Contractures Contractures Info: Not present    Additional Factors Info  Code Status,Allergies Code Status Info: DNR Allergies Info: Amlodipine   Insulin Sliding Scale Info: insulin aspart (novoLOG) injection 0-9 Units       Current Medications (02/13/2021):  This is the current hospital active medication list Current Facility-Administered Medications  Medication Dose Route Frequency Provider Last Rate Last Admin  . atorvastatin (LIPITOR) tablet 80 mg  80 mg Per Tube Daily Dessa Phi, DO   80 mg at 02/13/21 0958  . carvedilol (COREG) tablet 6.25 mg  6.25 mg Per NG tube BID WC Hall, Carole N, DO   6.25 mg at 02/13/21 0959  . chlorhexidine (PERIDEX) 0.12 % solution 15 mL  15 mL Mouth Rinse BID Mariel Aloe, MD   15 mL at 02/13/21 1000  . dorzolamide-timolol (COSOPT) 22.3-6.8 MG/ML ophthalmic solution 1 drop  1 drop Both Eyes BID Etta Quill, DO   1 drop at 02/13/21 0959  . feeding supplement (NEPRO CARB STEADY) liquid 1,000 mL  1,000 mL Per Tube Continuous Raiford Noble  Latif, DO 50 mL/hr at 02/13/21 1009 1,000 mL at 02/13/21 1009  . ferrous sulfate 300 (60 Fe) MG/5ML syrup 300 mg  300 mg Per Tube Orest Dikes, DO   300 mg at 02/13/21 0959  . free water 200 mL  200 mL Per Tube QID Wendee Beavers T, MD   200 mL at 02/13/21 1003  . guaiFENesin (ROBITUSSIN) 100 MG/5ML solution 100 mg  5 mL Per Tube Q4H PRN Raiford Noble [place], DO   100 mg at 02/11/21 1791  . hydrALAZINE (APRESOLINE) injection 10-20 mg  10-20 mg Intravenous Q6H PRN Raiford Noble Latif, DO   20 mg at 02/13/21 0348  . hydrALAZINE (APRESOLINE) tablet 50 mg  50 mg Per Tube Q8H Hall, Carole N, DO   50 mg at 02/13/21 0548  . insulin aspart (novoLOG) injection 0-9 Units  0-9 Units Subcutaneous Q4H Mariel Aloe, MD   1 Units at 02/11/21 0357  . insulin glargine (LANTUS) injection 5 Units  5 Units Subcutaneous BID Kayleen Memos, DO   5 Units at 02/13/21 5056  . ipratropium (ATROVENT) nebulizer solution 0.5 mg  0.5 mg Nebulization Q6H PRN Oswald Hillock, MD      . lactulose (CHRONULAC) 10 GM/15ML solution 20 g  20 g Per Tube BID Wendee Beavers T, MD   20 g at 02/13/21 1000  . levalbuterol (XOPENEX) nebulizer solution 0.63 mg  0.63 mg Nebulization Q6H PRN Oswald Hillock, MD      . lipase/protease/amylase) Rosann Auerbach) tablets 20,880 Units  20,880 Units Per Tube Once Opyd, Ilene Qua, MD      . MEDLINE mouth rinse  15 mL Mouth Rinse q12n4p Mariel Aloe, MD   15 mL at 03/01/21 1827  . metoprolol tartrate (LOPRESSOR) injection 2.5 mg  2.5 mg Intravenous Q6H PRN Irene Pap N, DO      . nystatin (MYCOSTATIN) 100000 UNIT/ML suspension 500,000 Units  5 mL Mouth/Throat QID Dessa Phi, DO   500,000 Units at 02/13/21 0959  . pantoprazole sodium (PROTONIX) 40 mg/20 mL oral suspension 40 mg  40 mg Per Tube Daily Dessa Phi, DO   40 mg at 02/13/21 1008  . sodium bicarbonate tablet 650 mg  650 mg Per Tube Once Opyd, Ilene Qua, MD         Discharge Medications: Please see discharge summary for a list of discharge medications.  Relevant Imaging Results:  Relevant Lab Results:   Additional Information SSN [id number]  Ross Ludwig, LCSW

## 2021-02-13 NOTE — Progress Notes (Signed)
Referring Physician(s): Dr. Alfredia Ferguson, Jenetta Downer  Supervising Physician: Jacqulynn Cadet  Patient Status:  [facility] - In-pt  Chief Complaint: S/p gastrostomy tube placement with Dr. Serafina Royals on 03-10-2021  Subjective: Patient sleeping in bed, not in acute distress.  G-tube connected to a suction canister, clear yellow fluid noted in the canister.   Allergies: Amlodipine  Medications: Prior to Admission medications   Medication Sig Start Date End Date Taking? Authorizing Provider  atorvastatin (LIPITOR) 80 MG tablet Take 80 mg by mouth daily.  10/13/18  Yes [provider]  carvedilol (COREG) 25 MG tablet Take 25 mg by mouth 2 (two) times daily with a meal.  10/13/18  Yes [provider]  clopidogrel (PLAVIX) 75 MG tablet Take 1 tablet (75 mg total) by mouth daily. 10/24/20  Yes Ghimire, Henreitta Leber, MD  dorzolamide-timolol (COSOPT) 22.3-6.8 MG/ML ophthalmic solution INSTILL 1 DROP INTO BOTH EYES TWICE A DAY Patient taking differently: Place 1 drop into both eyes 2 (two) times daily. 01/14/20  Yes Bernarda Caffey, MD  ferrous sulfate 325 (65 FE) MG tablet Take 325 mg by mouth every other day.   Yes [provider]  furosemide (LASIX) 40 MG tablet Take 40 mg by mouth daily.   Yes [provider]  hydrALAZINE (APRESOLINE) 25 MG tablet Take 1 tablet (25 mg total) by mouth See admin instructions. Take 50 mg by mouth TID. Patient taking differently: Take 25 mg by mouth in the morning and at bedtime. 11/26/20  Yes Samuella Cota, MD  insulin lispro (HUMALOG) 100 UNIT/ML injection Inject 0-6 Units into the skin 3 (three) times daily. Sliding scale :  70-150= 0 units 151-200= 1 unit 201-250=  2 units 251-300= 3 units 301-350= 4 units 351-400= 5 units Over 400 = 6 units and call MD   Yes [provider]  loperamide (IMODIUM A-D) 2 MG tablet Take 1 tablet (2 mg total) by mouth 4 (four) times daily as needed for diarrhea or loose stools. 08/09/20  Yes [name], FNP  pantoprazole (PROTONIX) 40 MG tablet Take 1 tablet (40 mg total) by mouth daily. 10/24/20  Yes Ghimire, Henreitta Leber, MD  Accu-Chek FastClix Lancets MISC 1 each by Other route as directed. To test blood glucose daily. 05/24/19   [provider]  Blood Glucose Monitoring Suppl (FIFTY50 GLUCOSE METER 2.0) w/Device KIT 1 each by Other route See admin instructions. Use to check blood sugars 08/31/18   [provider]  Insulin Pen Needle (FIFTY50 PEN NEEDLES) 32G X 4 MM MISC 1 each by Other route See admin instructions. Use as instructed. 11/17/16   [provider]     Vital Signs: BP (!) 158/65   Pulse 68   Temp (!) 97.5 F (36.4 C)   Resp 20   Ht 5' 7" (1.702 m)   Wt 182 lb 1.6 oz (82.6 kg)   SpO2 100%   BMI 28.52 kg/m   Physical Exam Abdominal:     Palpations: Abdomen is soft.     Comments: Positive for gastrostomy tube in LUQ. Dressing clean and dry. G tube intact, bumper with two T-tags under. Minimal bright red blood and old blood around the T-tags noted. Site is unremarkable with no erythema, edema, tenderness, or drainage.      Imaging: IR GASTROSTOMY TUBE MOD SED  Result Date: 03/10/2021 INDICATION: 75 year old female with history of stroke and dysphagia. EXAM: PERC PLACEMENT GASTROSTOMY MEDICATIONS: Ancef 2 gm IV; Antibiotics were administered within 1 hour of  the procedure. ANESTHESIA/SEDATION: Versed 0 mg IV; Fentanyl 0 mcg IV Moderate Sedation Time:  0 The patient was continuously monitored during the procedure by the interventional radiology nurse under my direct supervision. CONTRAST:  15mL OMNIPAQUE IOHEXOL 300 MG/ML SOLN - administered into the gastric lumen. FLUOROSCOPY TIME:  Fluoroscopy Time: 0 minutes 18 seconds (3 mGy). COMPLICATIONS: None immediate. PROCEDURE: Informed written consent was obtained from the patient after a thorough discussion of the procedural risks, benefits and alternatives. All questions were addressed. Maximal  Sterile Barrier Technique was utilized including caps, mask, sterile gowns, sterile gloves, sterile drape, hand hygiene and skin antiseptic. A timeout was performed prior to the initiation of the procedure. The patient was placed on the procedure table in the supine position. Pre-procedure abdominal film confirmed visualization of the transverse colon. The stomach was insufflated with air via the indwelling nasogastric tube. Under fluoroscopy, a puncture site was selected and local analgesia achieved with 1% lidocaine infiltrated subcutaneously. Under fluoroscopic guidance, a gastropexy needle was passed into the stomach and the T-bar suture was released. Entry into the stomach was confirmed with fluoroscopy, aspiration of air, and injection of contrast material. This was repeated with an additional gastropexy suture (for a total of 2 fasteners). At the center of these gastropexy sutures, a dermatotomy was performed. An 18 gauge needle was passed into the stomach at the site of this dermatotomy, and position within the gastric lumen again confirmed under fluoroscopy using aspiration of air and contrast injection. An Amplatz guidewire was passed through this needle and intraluminal placement within the stomach was confirmed by fluoroscopy. The needle was removed. Over the guidewire, the percutaneous tract was dilated using a 10 mm non-compliant balloon. The balloon was deflated, then pushed into the gastric lumen followed in concert by the 20 Fr gastrostomy tube. The retention balloon of the percutaneous gastrostomy tube was inflated with 20 mL of sterile water. The tube was withdrawn until the retention balloon was at the edge of the gastric lumen. The external bumper was brought to the abdominal wall. Contrast was injected through the gastrostomy tube, confirming intraluminal positioning. The patient tolerated the procedure well without any immediate post-procedural complications. IMPRESSION: Technically successful  placement of 20 Fr gastrostomy tube with absorbable gastropexy sutures. Dylan Suttle, MD [facility] Electronically Signed   By: Dylan  Suttle MD   On: 02/13/2021 17:08   DG CHEST PORT 1 VIEW  Result Date: 03/08/2021 CLINICAL DATA:  Shortness of breath. EXAM: PORTABLE CHEST 1 VIEW COMPARISON:  02/11/2021. FINDINGS: Feeding tube noted with tip below left hemidiaphragm. Cardiac pacer stable position. Cardiomegaly with diffuse bilateral interstitial prominence and small pleural effusions again noted. Findings most consistent with CHF. Pneumonitis cannot be excluded. No interim change. No pneumothorax. Surgical clips left chest. IMPRESSION: Cardiac pacer in stable position. Cardiomegaly with diffuse bilateral interstitial prominence and small pleural effusions again noted. Findings most consistent with CHF. Similar findings noted on prior exam. Electronically Signed   By: Thomas  Register   On: 02/22/2021 05:38   DG CHEST PORT 1 VIEW  Result Date: 02/11/2021 CLINICAL DATA:  Shortness of breath, history breast cancer, stroke, hypertension, diabetes mellitus EXAM: PORTABLE CHEST 1 VIEW COMPARISON:  Portable exam 0459 hours compared to 02/09/2021 FINDINGS: Feeding tube extends into abdomen. RIGHT subclavian pacemaker with leads projecting over RIGHT atrium and RIGHT ventricle. Enlargement of cardiac silhouette with pulmonary vascular congestion. BILATERAL pulmonary infiltrates consistent with pulmonary edema. Bibasilar pleural effusions, small. No pneumothorax. Atherosclerotic calcification at   aortic arch. Surgical clips at LEFT axilla. IMPRESSION: Persistent pulmonary edema and CHF. Aortic Atherosclerosis (ICD10-I70.0). Electronically Signed   By: Mark  Boles M.D.   On: 02/11/2021 08:20   DG CHEST PORT 1 VIEW  Result Date: 02/09/2021 CLINICAL DATA:  The hip knee. EXAM: PORTABLE CHEST 1 VIEW COMPARISON:  January 05, 2021 FINDINGS: Feeding catheter  transverses the thorax. Dual lead cardiac pacemaker in stable position. Enlarged cardiac silhouette. Calcific atherosclerotic disease of the aorta. Bilateral pleural effusions.  Mild interstitial pulmonary edema. Chest wall postsurgical changes. IMPRESSION: 1. Enlarged cardiac silhouette with mild interstitial pulmonary edema and bilateral pleural effusions. Electronically Signed   By: Dobrinka  Dimitrova M.D.   On: 02/09/2021 18:30   US Abdomen Limited RUQ (LIVER/GB)  Result Date: 02/09/2021 CLINICAL DATA:  Abnormal LFTs EXAM: ULTRASOUND ABDOMEN LIMITED RIGHT UPPER QUADRANT COMPARISON:  None. FINDINGS: Gallbladder: No gallstones or wall thickening visualized. No sonographic Murphy sign noted by sonographer. Common bile duct: Diameter: 3 mm Liver: No focal lesion identified. Within normal limits in parenchymal echogenicity. Portal vein is patent on color Doppler imaging with normal direction of blood flow towards the liver. Other: Right-sided pleural effusion. IMPRESSION: 1. Unremarkable ultrasound of the right upper quadrant. 2. Right-sided pleural effusion. Electronically Signed   By: Jeffrey  Waltz MD   On: 02/09/2021 15:45    Labs:  CBC: Recent Labs    02/10/21 0344 02/11/21 0407 03/10/2021 0348 02/13/21 0400  WBC 5.5 7.3 5.4 6.9  HGB 8.2* 8.7* 8.3* 8.2*  HCT 25.5* 27.8* 27.0* 27.3*  PLT 98* 122* 111* 118*    COAGS: Recent Labs    11/24/20 0943 02/17/2021 0348  INR 1.2 1.1  APTT 33  --     BMP: Recent Labs    06/10/20 1433 08/07/20 1559 10/19/20 0001 02/10/21 0344 02/11/21 0407 02/23/2021 0348 02/13/21 0400  NA 140 143   < > 135 138 137 141  K 5.8* 6.8*   < > 3.9 4.0 3.8 4.0  CL 113* 115*   < > 99 99 100 103  CO2 17* 17*   < > 24 25 26 28  GLUCOSE 106* 113*   < > 151* 136* 117* 94  BUN 27 27   < > 105* 103* 113* 112*  CALCIUM 9.8 9.8   < > 9.1 9.4 9.1 9.1  CREATININE 2.66* 2.56*   < > 2.85* 2.82* 2.93* 3.02*  GFRNONAA 17* 18*   < > 17* 17* 16* 15*  GFRAA 19* 20*  --    --   --   --   --    < > = values in this interval not displayed.    LIVER FUNCTION TESTS: Recent Labs    02/10/21 0344 02/11/21 0407 03/12/2021 0348 02/13/21 0400  BILITOT 0.8 0.5 0.5 0.7  AST 78* 69* 74* 66*  ALT 103* 103* 95* 82*  ALKPHOS 235* 283* 232* 209*  PROT 6.5 7.3 6.8 6.8  ALBUMIN 2.4* 2.6* 2.5* 2.6*    Assessment and Plan: S/p gastrostomy tube placement with Dr. Suttle on 03/03/2021 G-tube is ready for use.  T-tags should fall off, but will need to be removed in 10 days it they does not fall off (on or before 3/13.)  Further plans per [facility]- appreciate and agree with management. Please call IR for questions and further assistant.   Electronically Signed: Aimee H Han, PA-C 02/13/2021, 9:48 AM   I spent a total of 15 Minutes at the the patient's bedside AND on the patient's   hospital floor or unit, greater than 50% of which was counseling/coordinating care for gastrostomy tube placement.      

## 2021-02-13 NOTE — Progress Notes (Signed)
I was asked to speak with Desiree. Desiree Pollard regarding Desiree Pollard and her candidacy for dialysis.  I went to assess pt-  She did look at me initially but then would not track and did not follow any commands-  BUN is over 100 and she does have peripheral edema  I explained to Desiree Pollard that the idea behind dialysis is either a bridge to transplant or to maintain some QOL in the setting of renal failure.  I explained that hemodialysis is not a benign procedure and that even the patients undergoing dialysis in an OP setting experience some semblence of discomfort as a result of dialysis.  When the patient is able to identify and deal with the risk/benefit ratio that is one thing but when they cannot then dialysis just feels like torture for no reason.  I also explained that she could not be a candidate for OP dialysis in her current state, could not sit up for the porlonged amount of time needed-  Would not be able to give the dialysis staff any feedback so that treatments could be altered for comfort  It is for those reasons that I agree with Dr. Jonnie Finner assessment that pt is not a dialysis candidate. I also explained to the sister that without the option of dialysis her life expectancy is not good.    She thanked me for our conversation  Louis Meckel

## 2021-02-14 DIAGNOSIS — G9341 Metabolic encephalopathy: Secondary | ICD-10-CM | POA: Diagnosis not present

## 2021-02-14 DIAGNOSIS — N184 Chronic kidney disease, stage 4 (severe): Secondary | ICD-10-CM | POA: Diagnosis not present

## 2021-02-14 DIAGNOSIS — N179 Acute kidney failure, unspecified: Secondary | ICD-10-CM | POA: Diagnosis not present

## 2021-02-14 LAB — GLUCOSE, CAPILLARY
Glucose-Capillary: 144 mg/dL — ABNORMAL HIGH (ref 70–99)
Glucose-Capillary: 132 mg/dL — ABNORMAL HIGH (ref 70–99)
Glucose-Capillary: 132 mg/dL — ABNORMAL HIGH (ref 70–99)
Glucose-Capillary: 125 mg/dL — ABNORMAL HIGH (ref 70–99)
Glucose-Capillary: 133 mg/dL — ABNORMAL HIGH (ref 70–99)

## 2021-02-14 LAB — BASIC METABOLIC PANEL
Anion gap: 14 (ref 5–15)
BUN: 112 mg/dL — ABNORMAL HIGH (ref 8–23)
CO2: 26 mmol/L (ref 22–32)
Calcium: 9.3 mg/dL (ref 8.9–10.3)
Chloride: 103 mmol/L (ref 98–111)
Creatinine, Ser: 2.98 mg/dL — ABNORMAL HIGH (ref 0.44–1.00)
GFR, Estimated: 16 mL/min — ABNORMAL LOW (ref >60)
Glucose, Bld: 160 mg/dL — ABNORMAL HIGH (ref 70–99)
Potassium: 3.8 mmol/L (ref 3.5–5.1)
Sodium: 143 mmol/L (ref 135–145)

## 2021-02-14 NOTE — Progress Notes (Signed)
[facility]  PROGRESS NOTE  Vermont Coulon LYY:[id number] DOB: November 18, 1946 DOA: 01/02/2021 PCP: [name], FNP   Brief HPI:   75 year old female with history of CVA with left hemiparesis, expressive aphasia, dementia, CKD stage IV, complete heart block status post pacemaker placement who was admitted from skilled nursing facility with hyper natremia, COVID-19 infection encephalopathy.  MRI was negative.  Hospital course was complicated by acute kidney injury and dysphagia.  Nephrology did not feel that patient was a hemodialysis candidate.  NG tube was placed but patient pulled out twice and was replaced for 3rd time.  Plan for PEG tube placement today.  Patient sister is trying to obtain healthcare power of attorney status.  She has decided to get PEG tube.  Patient's renal function has been worsening in the hospital.  As above she is not a HD candidate.  Patient is on furosemide 60 mg IV twice daily.    Subjective   Patient seen and examined, s/p PEG tube placement.  Still continues to be lethargic.   Assessment/Plan:     1. Acute metabolic encephalopathy-likely multifactorial from worsening renal function.  Patient's BUN is stable at 112 after stopping Lasix.  TSH, B12, EEG, CT head, MRI brain were negative for acute abnormality.  Patient is not a hemodialysis candidate due to multiple comorbidities as per nephrology. 2. Oropharyngeal dysphagia-NG tube was placed twice which patient pulled out.  Feeding tube has been placed for 3rd time.  She is on dysphagia one diet, not been eating due to the worsening mental status.  Patient sister has requested PEG tube placement, palliative care was consulted however patient sister does not want anymore palliative care discussion.  PEG tube placed on 02/26/2021. 3. Nonoliguric acute kidney injury on CKD stage IV-patient has progressive azotemia, possible uremia.  BUN/creatinine is slightly trending up.  Today BUN is 112.  Renal ultrasound showed  no finding.  Patient not hemodialysis candidate per nephrology.  Started on Lasix 60 mg IV twice daily.  Called and discussed with nephrology, Dr. Moshe Cipro, will hold Lasix at this time.  She discussed with patient's sister, patient is not hemodialysis candidate. 4. Pancytopenia-patient has pancytopenia with leukopenia and thrombocytopenia.  FOBT was negative.  Anemia panel was consistent with anemia of chronic disease.  Likely from malnutrition.  Hemoglobin is stable at 8.3. 5. Anasarca-secondary to hypoalbuminemia-nephrology does not recommend giving albumin.  Continue Lasix as above. 6. Hypothermia-patient had episode of hypothermia with temperature 94.1 2 days ago.  Resolved.  She was applied [employer], temperature has improved.  Now stable. 7. COVID-19 infection-she tested positive on 01/02/2021.  Received remdesivir x3 doses.  No indication for airborne isolation. 8. Hypertension-continue Coreg 6.25 mg p.o. twice daily, hydralazine 50 mg 3 times daily. 9. Acute on chronic diastolic CHF-patient is currently requiring 2 L/min of oxygen by nasal cannula.  Chest x-ray done 2 days ago still shows pulmonary edema.  Continue IV Lasix as above. 10. History of CVA-patient has history of CVA in October 2021 with residual left hemiparesis and expressive aphasia.  CT head and MRI brain showed no acute finding. 58. Transaminitis-patient has mild transaminitis, which is slowly improving.  Right upper quadrant ultrasound was unremarkable.  Hepatitis panel is pending. 12. Goals of care-discussed with patient's sister Ms. Cheek at bedside.  Discussed in detail regarding patient's condition and worsening azotemia.  Also discussed poor prognosis considering persistent lethargy, encephalopathy.  Patient is going for PEG tube placement today, and sister is hopeful that she might  improve after she receives nutrition.  I discussed in detail need for intubation and mechanical ventilation in case patient develops  acute respiratory failure, also discussed CPR in the event of cardiac arrest.  Also discussed the futility of these measures considering patient's worsening clinical condition.  Patient sister agrees for DNR.  Also patient sister had questions on why she is not a hemodialysis candidate.  Dr. Lyda Kalata discussed with patient sister, she is not a hemodialysis candidate.     COVID-19 Labs  No results for input(s): DDIMER, FERRITIN, LDH, CRP in the last 72 hours.  Lab Results  Component Value Date   SARSCOV2NAA POSITIVE (A) 01/02/2021   [place] NEGATIVE 11/24/2020   [place] NEGATIVE 10/28/2020   [place] NEGATIVE 10/25/2020     Scheduled medications:   . atorvastatin  80 mg Per Tube Daily  . carvedilol  6.25 mg Per NG tube BID WC  . chlorhexidine  15 mL Mouth Rinse BID  . dorzolamide-timolol  1 drop Both Eyes BID  . ferrous sulfate  300 mg Per Tube QODAY  . free water  200 mL Per Tube QID  . hydrALAZINE  50 mg Per Tube Q8H  . insulin aspart  0-9 Units Subcutaneous Q4H  . insulin glargine  5 Units Subcutaneous BID  . lactulose  20 g Per Tube BID  . lipase/protease/amylase)  20,880 Units Per Tube Once  . mouth rinse  15 mL Mouth Rinse q12n4p  . nystatin  5 mL Mouth/Throat QID  . pantoprazole sodium  40 mg Per Tube Daily  . sodium bicarbonate  650 mg Per Tube Once         CBG: Recent Labs  Lab 02/13/21 1703 02/13/21 1957 02/13/21 2347 02/14/21 0406 02/14/21 0741  GLUCAP 120* 133* 124* 144* 132*    SpO2: 98 % O2 Flow Rate (L/min): 2 L/min    CBC: Recent Labs  Lab 02/08/21 0336 02/09/21 0409 02/10/21 0344 02/11/21 0407 03/07/2021 0348 02/13/21 0400  WBC 4.8 5.2 5.5 7.3 5.4 6.9  NEUTROABS 3.3 3.6 3.8 5.6 3.8  --   HGB 8.1* 8.3* 8.2* 8.7* 8.3* 8.2*  HCT 25.7* 26.8* 25.5* 27.8* 27.0* 27.3*  MCV 92.1 94.0 91.1 93.6 97.1 97.2  PLT 121* 110* 98* 122* 111* 118*    Basic Metabolic Panel: Recent Labs  Lab 02/08/21 0336 02/09/21 0409 02/10/21 0344  02/11/21 0407 03-07-2021 0348 02/13/21 0400 02/14/21 0942  NA 136 137 135 138 137 141 143  K 3.9 4.0 3.9 4.0 3.8 4.0 3.8  CL 100 99 99 99 100 103 103  CO2 26 28 24 25 26 28 26   GLUCOSE 135* 158* 151* 136* 117* 94 160*  BUN 84* 105* 105* 103* 113* 112* 112*  CREATININE 2.82* 2.87* 2.85* 2.82* 2.93* 3.02* 2.98*  CALCIUM 9.0 9.2 9.1 9.4 9.1 9.1 9.3  MG 2.2 2.4 2.3 2.5* 2.4  --   --   PHOS 4.3 4.7* 4.4 3.6 5.6*  --   --      Liver Function Tests: Recent Labs  Lab 02/09/21 0409 02/10/21 0344 02/11/21 0407 07-Mar-2021 0348 02/13/21 0400  AST 89* 78* 69* 74* 66*  ALT 110* 103* 103* 95* 82*  ALKPHOS 261* 235* 283* 232* 209*  BILITOT 0.7 0.8 0.5 0.5 0.7  PROT 6.6 6.5 7.3 6.8 6.8  ALBUMIN 2.4* 2.4* 2.6* 2.5* 2.6*     Antibiotics: Anti-infectives (From admission, onward)   Start     Dose/Rate Route Frequency Ordered Stop   03-07-2021 1200  ceFAZolin (  ANCEF) IVPB 2g/100 mL premix        2 g 200 mL/hr over 30 Minutes Intravenous  Once 02/11/21 1052 Mar 10, 2021 1333   01/07/21 0800  ceFEPIme (MAXIPIME) 2 g in sodium chloride 0.9 % 100 mL IVPB  Status:  Discontinued        2 g 200 mL/hr over 30 Minutes Intravenous Daily 01/06/21 1159 01/08/21 1257   01/05/21 1500  vancomycin (VANCOCIN) IVPB 1000 mg/200 mL premix  Status:  Discontinued        1,000 mg 200 mL/hr over 60 Minutes Intravenous Every 24 hours 01/05/21 1357 01/05/21 1414   01/05/21 1500  vancomycin (VANCOCIN) IVPB 1000 mg/200 mL premix  Status:  Discontinued        1,000 mg 200 mL/hr over 60 Minutes Intravenous Every 48 hours 01/05/21 1414 01/06/21 1146   01/05/21 1445  ceFEPIme (MAXIPIME) 2 g in sodium chloride 0.9 % 100 mL IVPB  Status:  Discontinued        2 g 200 mL/hr over 30 Minutes Intravenous Daily 01/05/21 1351 01/06/21 1146   01/03/21 1000  remdesivir 100 mg in sodium chloride 0.9 % 100 mL IVPB  Status:  Discontinued       "Followed by" Linked Group Details   100 mg 200 mL/hr over 30 Minutes Intravenous Daily  01/02/21 2257 01/04/21 1330   01/02/21 2300  remdesivir 200 mg in sodium chloride 0.9% 250 mL IVPB       "Followed by" Linked Group Details   200 mg 580 mL/hr over 30 Minutes Intravenous Once 01/02/21 2257 01/03/21 0147       DVT prophylaxis: Heparin  Code Status: Full code  Family Communication: Discussed with patient's sister at bedside   Consultants:    Procedures:      Objective   Vitals:   02/13/21 2001 02/14/21 0236 02/14/21 0422 02/14/21 0953  BP: (!) 146/57  (!) 141/61   Pulse: 64  60   Resp: 20  (!) 22   Temp: 98.4 F (36.9 C)  97.9 F (36.6 C)   TempSrc: Oral     SpO2: 100%  100% 98%  Weight:  81.2 kg    Height:        Intake/Output Summary (Last 24 hours) at 02/14/2021 1113 Last data filed at 02/14/2021 0400 Gross per 24 hour  Intake 1492.5 ml  Output --  Net 1492.5 ml    03/02 1901 - 03/04 0700 In: 1547.5  Out: 700 [Urine:700]  Filed Weights   2021/03/10 0500 02/13/21 0258 02/14/21 0236  Weight: 83.3 kg 82.6 kg 81.2 kg    Physical Examination:   General-somnolent Heart-S1-S2, regular, no murmur auscultated Lungs-clear to auscultation bilaterally, no wheezing or crackles auscultated Abdomen-soft, nontender, no organomegaly Extremities-no edema in the lower extremities Neuro-somnolent, not following commands  Status is: Inpatient  Dispo: The patient is from: Home              Anticipated d/c is to: Skilled nursing facility              Anticipated d/c date is: 02/17/2021              Patient currently not stable for discharge  Barrier to discharge-ongoing management for encephalopathy, poor p.o. intake  Pressure Injury Sacrum Mid red open skin (Active)     Location: Sacrum  Location Orientation: Mid  Staging:   Wound Description (Comments): red open skin  Present on Admission: Yes  Data Reviewed:   No results found for this or any previous visit (from the past 240 hour(s)).  No results for input(s): LIPASE,  AMYLASE in the last 168 hours. No results for input(s): AMMONIA in the last 168 hours.  Cardiac Enzymes: No results for input(s): CKTOTAL, CKMB, CKMBINDEX, TROPONINI in the last 168 hours. BNP (last 3 results) Recent Labs    10/19/20 0001 01/02/21 1947  BNP 321.9* 536.1*    ProBNP (last 3 results) No results for input(s): PROBNP in the last 8760 hours.  Studies:  IR GASTROSTOMY TUBE MOD SED  Result Date: 03-08-2021 INDICATION: 75 year old female with history of stroke and dysphagia. EXAM: PERC PLACEMENT GASTROSTOMY MEDICATIONS: Ancef 2 gm IV; Antibiotics were administered within 1 hour of the procedure. ANESTHESIA/SEDATION: Versed 0 mg IV; Fentanyl 0 mcg IV Moderate Sedation Time:  0 The patient was continuously monitored during the procedure by the interventional radiology nurse under my direct supervision. CONTRAST:  58mL OMNIPAQUE IOHEXOL 300 MG/ML SOLN - administered into the gastric lumen. FLUOROSCOPY TIME:  Fluoroscopy Time: 0 minutes 18 seconds (3 mGy). COMPLICATIONS: None immediate. PROCEDURE: Informed written consent was obtained from the patient after a thorough discussion of the procedural risks, benefits and alternatives. All questions were addressed. Maximal Sterile Barrier Technique was utilized including caps, mask, sterile gowns, sterile gloves, sterile drape, hand hygiene and skin antiseptic. A timeout was performed prior to the initiation of the procedure. The patient was placed on the procedure table in the supine position. Pre-procedure abdominal film confirmed visualization of the transverse colon. The stomach was insufflated with air via the indwelling nasogastric tube. Under fluoroscopy, a puncture site was selected and local analgesia achieved with 1% lidocaine infiltrated subcutaneously. Under fluoroscopic guidance, a gastropexy needle was passed into the stomach and the T-bar suture was released. Entry into the stomach was confirmed with fluoroscopy, aspiration of air,  and injection of contrast material. This was repeated with an additional gastropexy suture (for a total of 2 fasteners). At the center of these gastropexy sutures, a dermatotomy was performed. An 18 gauge needle was passed into the stomach at the site of this dermatotomy, and position within the gastric lumen again confirmed under fluoroscopy using aspiration of air and contrast injection. An Amplatz guidewire was passed through this needle and intraluminal placement within the stomach was confirmed by fluoroscopy. The needle was removed. Over the guidewire, the percutaneous tract was dilated using a 10 mm non-compliant balloon. The balloon was deflated, then pushed into the gastric lumen followed in concert by the 20 Fr gastrostomy tube. The retention balloon of the percutaneous gastrostomy tube was inflated with 20 mL of sterile water. The tube was withdrawn until the retention balloon was at the edge of the gastric lumen. The external bumper was brought to the abdominal wall. Contrast was injected through the gastrostomy tube, confirming intraluminal positioning. The patient tolerated the procedure well without any immediate post-procedural complications. IMPRESSION: Technically successful placement of 20 Fr gastrostomy tube with absorbable gastropexy sutures. Ruthann Cancer, MD [facility] Radiology Electronically Signed   By: Ruthann Cancer MD   On: 03/08/21 17:08       Oswald Hillock   [facility] If 7PM-7AM, please contact night-coverage at [email or web address], Office  [phone]   02/14/2021, 11:13 AM  LOS: 42 days

## 2021-02-14 NOTE — Progress Notes (Signed)
SLP Cancellation Note  Patient Details Name: Desiree Pollard MRN: [id number] DOB: 1946/05/22   Cancelled treatment:       Reason Eval/Treat Not Completed: Other (comment);Medical issues which prohibited therapy (pt npo and has peg hooked up to suction, will follow up next week)  Kathleen Lime, MS [facility] SLP Acute Rehab Services Office [phone] Pager [phone]    [name] 02/14/2021, 12:27 PM

## 2021-02-15 DIAGNOSIS — G9341 Metabolic encephalopathy: Secondary | ICD-10-CM | POA: Diagnosis not present

## 2021-02-15 DIAGNOSIS — N179 Acute kidney failure, unspecified: Secondary | ICD-10-CM | POA: Diagnosis not present

## 2021-02-15 DIAGNOSIS — R4701 Aphasia: Secondary | ICD-10-CM | POA: Diagnosis not present

## 2021-02-15 DIAGNOSIS — R9389 Abnormal findings on diagnostic imaging of other specified body structures: Secondary | ICD-10-CM | POA: Diagnosis not present

## 2021-02-15 LAB — BASIC METABOLIC PANEL
Anion gap: 6 (ref 5–15)
BUN: 114 mg/dL — ABNORMAL HIGH (ref 8–23)
CO2: 32 mmol/L (ref 22–32)
Calcium: 9 mg/dL (ref 8.9–10.3)
Chloride: 103 mmol/L (ref 98–111)
Creatinine, Ser: 2.81 mg/dL — ABNORMAL HIGH (ref 0.44–1.00)
GFR, Estimated: 17 mL/min — ABNORMAL LOW (ref >60)
Glucose, Bld: 164 mg/dL — ABNORMAL HIGH (ref 70–99)
Potassium: 3.7 mmol/L (ref 3.5–5.1)
Sodium: 141 mmol/L (ref 135–145)

## 2021-02-15 LAB — GLUCOSE, CAPILLARY
Glucose-Capillary: 141 mg/dL — ABNORMAL HIGH (ref 70–99)
Glucose-Capillary: 147 mg/dL — ABNORMAL HIGH (ref 70–99)
Glucose-Capillary: 150 mg/dL — ABNORMAL HIGH (ref 70–99)
Glucose-Capillary: 161 mg/dL — ABNORMAL HIGH (ref 70–99)
Glucose-Capillary: 164 mg/dL — ABNORMAL HIGH (ref 70–99)
Glucose-Capillary: 172 mg/dL — ABNORMAL HIGH (ref 70–99)
Glucose-Capillary: 180 mg/dL — ABNORMAL HIGH (ref 70–99)

## 2021-02-15 NOTE — Progress Notes (Signed)
[facility]  PROGRESS NOTE  Vermont Salaz YFV:[id number] DOB: [birth year] DOA: 01/02/2021 PCP: [name], FNP   Brief HPI:   75 year old female with history of CVA with left hemiparesis, expressive aphasia, dementia, CKD stage IV, complete heart block status post pacemaker placement who was admitted from skilled nursing facility with hyper natremia, COVID-19 infection encephalopathy.  MRI was negative.  Hospital course was complicated by acute kidney injury and dysphagia.  Nephrology did not feel that patient was a hemodialysis candidate.  NG tube was placed but patient pulled out twice and was replaced for 3rd time.  Plan for PEG tube placement today.  Patient sister is trying to obtain healthcare power of attorney status.  She has decided to get PEG tube.  Patient's renal function has been worsening in the hospital.  As above she is not a HD candidate.  Patient is on furosemide 60 mg IV twice daily.    Subjective   Patient seen and examined, she is much more alert.  Communicating.  Denies any pain.  Yesterday developed hypothermia with temperature dropped to 95.  Improved with [employer].  No signs and symptoms of sepsis.  Vitals are stable.   Assessment/Plan:     1. Acute metabolic encephalopathy-significantly improved, likely multifactorial from worsening renal function.  Patient's BUN is stable at 114 after stopping Lasix.  TSH, B12, EEG, CT head, MRI brain were negative for acute abnormality.  Patient is not a hemodialysis candidate due to multiple comorbidities as per nephrology. 2. Hypothermia-unclear etiology, patient has temperature dropped to 95 yesterday, improved with Bair hugger blanket.  Today she is back to baseline.  Mentally she is alert and oriented x3.  No signs symptoms of sepsis.  We will continue to monitor. 3. Oropharyngeal dysphagia-NG tube was placed twice which patient pulled out.  NG feeding tube was placed for third time.  NG feeding tube is currently  out after PEG tube was placed.    Patient sister has requested PEG tube placement, palliative care was consulted however patient sister does not want anymore palliative care discussion.  PEG tube placed on 2021/03/10. 4. Nonoliguric acute kidney injury on CKD stage IV-patient has progressive azotemia, possible uremia.  BUN has stabilized after stopping Lasix.  Today BUN is 114.  Renal ultrasound showed no finding.  Patient not hemodialysis candidate per nephrology.  Started on Lasix 60 mg IV twice daily for pulmonary edema.  Called and discussed with nephrology, Dr. Moshe Cipro, will hold Lasix at this time.  She discussed with patient's sister, patient is not hemodialysis candidate. 5. Pancytopenia-patient has pancytopenia with leukopenia and thrombocytopenia.  FOBT was negative.  Anemia panel was consistent with anemia of chronic disease.  Likely from malnutrition.  Hemoglobin is stable at 8.2.. 6. Anasarca-secondary to hypoalbuminemia-nephrology does not recommend giving albumin.  Lasix on hold due to worsening renal function as above. 7. COVID-19 infection-she tested positive on 01/02/2021.  Received remdesivir x3 doses.  No indication for airborne isolation. 8. Hypertension-continue Coreg 6.25 mg p.o. twice daily, hydralazine 50 mg 3 times daily. 9. Acute on chronic diastolic CHF-patient is currently requiring 2 L/min of oxygen by nasal cannula.  Chest x-ray done 2 days ago still shows pulmonary edema.  Lasix held due to worsening renal function.  Currently patient not requiring oxygen.  Will monitor. 10. Diabetes mellitus type 2-continue Lantus, sliding scale insulin NovoLog.  CBG well controlled.  Continue monitoring CBG every 4 hours. 11. History of CVA-patient has history of CVA in October 2021 with  residual left hemiparesis and expressive aphasia.  CT head and MRI brain showed no acute finding. 12. Transaminitis-patient has mild transaminitis, which is slowly improving.  Right upper quadrant  ultrasound was unremarkable.  Hepatitis panel is pending. 13. Goals of care-discussed with patient's sister Ms. Cheek at bedside.  Discussed in detail regarding patient's condition and worsening azotemia.  Also discussed poor prognosis considering persistent lethargy, encephalopathy.  Patient is going for PEG tube placement today, and sister is hopeful that she might improve after she receives nutrition.  I discussed in detail need for intubation and mechanical ventilation in case patient develops acute respiratory failure, also discussed CPR in the event of cardiac arrest.  Also discussed the futility of these measures considering patient's worsening clinical condition.  Patient sister agrees for DNR.  Also patient sister had questions on why she is not a hemodialysis candidate.  Dr. Lyda Kalata discussed with patient sister, she is not a hemodialysis candidate.     COVID-19 Labs  No results for input(s): DDIMER, FERRITIN, LDH, CRP in the last 72 hours.  Lab Results  Component Value Date   SARSCOV2NAA POSITIVE (A) 01/02/2021   [place] NEGATIVE 11/24/2020   [place] NEGATIVE 10/28/2020   [place] NEGATIVE 10/25/2020     Scheduled medications:   . atorvastatin  80 mg Per Tube Daily  . carvedilol  6.25 mg Per NG tube BID WC  . chlorhexidine  15 mL Mouth Rinse BID  . dorzolamide-timolol  1 drop Both Eyes BID  . ferrous sulfate  300 mg Per Tube QODAY  . free water  200 mL Per Tube QID  . hydrALAZINE  50 mg Per Tube Q8H  . insulin aspart  0-9 Units Subcutaneous Q4H  . insulin glargine  5 Units Subcutaneous BID  . lactulose  20 g Per Tube BID  . lipase/protease/amylase)  20,880 Units Per Tube Once  . mouth rinse  15 mL Mouth Rinse q12n4p  . nystatin  5 mL Mouth/Throat QID  . pantoprazole sodium  40 mg Per Tube Daily  . sodium bicarbonate  650 mg Per Tube Once         CBG: Recent Labs  Lab 02/14/21 1632 02/14/21 2010 02/15/21 0020 02/15/21 0527 02/15/21 0730  GLUCAP  125* 133* 141* 147* 150*    SpO2: 97 % O2 Flow Rate (L/min): 2 L/min    CBC: Recent Labs  Lab 02/09/21 0409 02/10/21 0344 02/11/21 0407 Feb 22, 2021 0348 02/13/21 0400  WBC 5.2 5.5 7.3 5.4 6.9  NEUTROABS 3.6 3.8 5.6 3.8  --   HGB 8.3* 8.2* 8.7* 8.3* 8.2*  HCT 26.8* 25.5* 27.8* 27.0* 27.3*  MCV 94.0 91.1 93.6 97.1 97.2  PLT 110* 98* 122* 111* 118*    Basic Metabolic Panel: Recent Labs  Lab 02/09/21 0409 02/10/21 0344 02/11/21 0407 02-22-21 0348 02/13/21 0400 02/14/21 0942 02/15/21 0359  NA 137 135 138 137 141 143 141  K 4.0 3.9 4.0 3.8 4.0 3.8 3.7  CL 99 99 99 100 103 103 103  CO2 28 24 25 26 28 26  32  GLUCOSE 158* 151* 136* 117* 94 160* 164*  BUN 105* 105* 103* 113* 112* 112* 114*  CREATININE 2.87* 2.85* 2.82* 2.93* 3.02* 2.98* 2.81*  CALCIUM 9.2 9.1 9.4 9.1 9.1 9.3 9.0  MG 2.4 2.3 2.5* 2.4  --   --   --   PHOS 4.7* 4.4 3.6 5.6*  --   --   --      Liver Function Tests: Recent Labs  Lab  02/09/21 0409 02/10/21 0344 02/11/21 0407 2021/03/11 0348 02/13/21 0400  AST 89* 78* 69* 74* 66*  ALT 110* 103* 103* 95* 82*  ALKPHOS 261* 235* 283* 232* 209*  BILITOT 0.7 0.8 0.5 0.5 0.7  PROT 6.6 6.5 7.3 6.8 6.8  ALBUMIN 2.4* 2.4* 2.6* 2.5* 2.6*     Antibiotics: Anti-infectives (From admission, onward)   Start     Dose/Rate Route Frequency Ordered Stop   11-Mar-2021 1200  ceFAZolin (ANCEF) IVPB 2g/100 mL premix        2 g 200 mL/hr over 30 Minutes Intravenous  Once 02/11/21 1052 2021/03/11 1333   01/07/21 0800  ceFEPIme (MAXIPIME) 2 g in sodium chloride 0.9 % 100 mL IVPB  Status:  Discontinued        2 g 200 mL/hr over 30 Minutes Intravenous Daily 01/06/21 1159 01/08/21 1257   01/05/21 1500  vancomycin (VANCOCIN) IVPB 1000 mg/200 mL premix  Status:  Discontinued        1,000 mg 200 mL/hr over 60 Minutes Intravenous Every 24 hours 01/05/21 1357 01/05/21 1414   01/05/21 1500  vancomycin (VANCOCIN) IVPB 1000 mg/200 mL premix  Status:  Discontinued        1,000 mg 200  mL/hr over 60 Minutes Intravenous Every 48 hours 01/05/21 1414 01/06/21 1146   01/05/21 1445  ceFEPIme (MAXIPIME) 2 g in sodium chloride 0.9 % 100 mL IVPB  Status:  Discontinued        2 g 200 mL/hr over 30 Minutes Intravenous Daily 01/05/21 1351 01/06/21 1146   01/03/21 1000  remdesivir 100 mg in sodium chloride 0.9 % 100 mL IVPB  Status:  Discontinued       "Followed by" Linked Group Details   100 mg 200 mL/hr over 30 Minutes Intravenous Daily 01/02/21 2257 01/04/21 1330   01/02/21 2300  remdesivir 200 mg in sodium chloride 0.9% 250 mL IVPB       "Followed by" Linked Group Details   200 mg 580 mL/hr over 30 Minutes Intravenous Once 01/02/21 2257 01/03/21 0147       DVT prophylaxis: Heparin  Code Status: Full code  Family Communication: Discussed with patient's sister on phone   Consultants:  IR  Procedures:  Peg tube placement    Objective   Vitals:   02/14/21 2019 02/15/21 0124 02/15/21 0605 02/15/21 0804  BP: (!) 167/67 (!) 175/67 (!) 164/62   Pulse: 71 63 79   Resp: 18  18   Temp: 98.1 F (36.7 C)  98.5 F (36.9 C) 97.6 F (36.4 C)  TempSrc:   Axillary Oral  SpO2: 96%  97%   Weight:      Height:        Intake/Output Summary (Last 24 hours) at 02/15/2021 1022 Last data filed at 02/15/2021 0500 Gross per 24 hour  Intake 1950 ml  Output 1150 ml  Net 800 ml    03/03 1901 - 03/05 0700 In: 3000  Out: 1150 [Urine:1150]  Filed Weights   03/11/2021 0500 02/13/21 0258 02/14/21 0236  Weight: 83.3 kg 82.6 kg 81.2 kg    Physical Examination:   General-appears in no acute distress Heart-S1-S2, regular, no murmur auscultated Lungs-clear to auscultation bilaterally, no wheezing or crackles auscultated Abdomen-soft, nontender, no organomegaly, PEG tube in place Extremities-no edema in the lower extremities Neuro-alert, oriented x3, no focal deficit noted  Status is: Inpatient  Dispo: The patient is from: Home              Anticipated  d/c is to: Skilled  nursing facility              Anticipated d/c date is: 02/17/2021              Patient currently not stable for discharge  Barrier to discharge-ongoing management for encephalopathy, poor p.o. intake  Pressure Injury Sacrum Mid red open skin (Active)     Location: Sacrum  Location Orientation: Mid  Staging:   Wound Description (Comments): red open skin  Present on Admission: Yes      Cardiac Enzymes: No results for input(s): CKTOTAL, CKMB, CKMBINDEX, TROPONINI in the last 168 hours. BNP (last 3 results) Recent Labs    10/19/20 0001 01/02/21 1947  BNP 321.9* 536.1*       [place]   [facility] If 7PM-7AM, please contact night-coverage at [email or web address], Office  [phone]   02/15/2021, 10:22 AM  LOS: 43 days

## 2021-02-16 DIAGNOSIS — G9341 Metabolic encephalopathy: Secondary | ICD-10-CM | POA: Diagnosis not present

## 2021-02-16 DIAGNOSIS — N184 Chronic kidney disease, stage 4 (severe): Secondary | ICD-10-CM | POA: Diagnosis not present

## 2021-02-16 DIAGNOSIS — N179 Acute kidney failure, unspecified: Secondary | ICD-10-CM | POA: Diagnosis not present

## 2021-02-16 LAB — GLUCOSE, CAPILLARY
Glucose-Capillary: 109 mg/dL — ABNORMAL HIGH (ref 70–99)
Glucose-Capillary: 116 mg/dL — ABNORMAL HIGH (ref 70–99)
Glucose-Capillary: 147 mg/dL — ABNORMAL HIGH (ref 70–99)
Glucose-Capillary: 148 mg/dL — ABNORMAL HIGH (ref 70–99)
Glucose-Capillary: 160 mg/dL — ABNORMAL HIGH (ref 70–99)
Glucose-Capillary: 167 mg/dL — ABNORMAL HIGH (ref 70–99)

## 2021-02-16 LAB — COMPREHENSIVE METABOLIC PANEL
ALT: 48 U/L — ABNORMAL HIGH (ref 0–44)
AST: 69 U/L — ABNORMAL HIGH (ref 15–41)
Albumin: 2.4 g/dL — ABNORMAL LOW (ref 3.5–5.0)
Alkaline Phosphatase: 253 U/L — ABNORMAL HIGH (ref 38–126)
Anion gap: 9 (ref 5–15)
BUN: 114 mg/dL — ABNORMAL HIGH (ref 8–23)
CO2: 28 mmol/L (ref 22–32)
Calcium: 9.1 mg/dL (ref 8.9–10.3)
Chloride: 106 mmol/L (ref 98–111)
Creatinine, Ser: 2.55 mg/dL — ABNORMAL HIGH (ref 0.44–1.00)
GFR, Estimated: 19 mL/min — ABNORMAL LOW (ref >60)
Glucose, Bld: 165 mg/dL — ABNORMAL HIGH (ref 70–99)
Potassium: 3.6 mmol/L (ref 3.5–5.1)
Sodium: 143 mmol/L (ref 135–145)
Total Bilirubin: 0.6 mg/dL (ref 0.3–1.2)
Total Protein: 6.7 g/dL (ref 6.5–8.1)

## 2021-02-16 LAB — CBC
HCT: 26.8 % — ABNORMAL LOW (ref 36.0–46.0)
Hemoglobin: 8 g/dL — ABNORMAL LOW (ref 12.0–15.0)
MCH: 28.9 pg (ref 26.0–34.0)
MCHC: 29.9 g/dL — ABNORMAL LOW (ref 30.0–36.0)
MCV: 96.8 fL (ref 80.0–100.0)
Platelets: 129 10*3/uL — ABNORMAL LOW (ref 150–400)
RBC: 2.77 MIL/uL — ABNORMAL LOW (ref 3.87–5.11)
RDW: 15.9 % — ABNORMAL HIGH (ref 11.5–15.5)
WBC: 5.6 10*3/uL (ref 4.0–10.5)
nRBC: 0 % (ref 0.0–0.2)

## 2021-02-16 MED ORDER — AMLODIPINE BESYLATE 10 MG PO TABS
10.0000 mg | ORAL_TABLET | Freq: Every day | ORAL | Status: DC
  Administered 2021-02-16 – 2021-02-19 (×4): 10 mg via ORAL
  Filled 2021-02-16 (×4): qty 1

## 2021-02-16 NOTE — TOC Progression Note (Signed)
Transition of Care [facility]) - Progression Note    Patient Details  Name: Desiree Pollard MRN: [id number] Date of Birth: Jul 28, 1946  Transition of Care [facility]) CM/SW [place], [place] Phone Number: [phone] 02/16/2021, 2:27 PM  Clinical Narrative:     CSW spoke with patient's cheek,christine (Sister) [phone] with update on SNF placement offers at [facility] and [facility].  [name] stated she would like the patient to go to [facility].  CSW stated the patient only had two bed offers but that I would contact [facility] but they had yet to make a bed offer.  [name] stated that [facility] was her second choice. CSW stated I would let her know about [facility] as soon as I received a reply.  CSW contacted [place] and left a voicemail inquiring about placement.  Alma Downs for [facility] and will change facility of [facility] accepts patient.  Possible d/c tomorrow.        Expected Discharge Plan and Services                                                 Social Determinants of Health (SDOH) Interventions    Readmission Risk Interventions Readmission Risk Prevention Plan 01/13/2021  Transportation Screening Complete  PCP or Specialist Appt within 3-5 Days Complete  Social Work Consult for [place] Planning/Counseling Complete  Palliative Care Screening Complete  Medication Review Press photographer) Referral to Pharmacy

## 2021-02-16 NOTE — Progress Notes (Signed)
[facility]  PROGRESS NOTE  [place] Rodda AOZ:[id number] DOB: 1946-03-11 DOA: 01/02/2021 PCP: [name], FNP   Brief HPI:   75 year old female with history of CVA with left hemiparesis, expressive aphasia, dementia, CKD stage IV, complete heart block status post pacemaker placement who was admitted from skilled nursing facility with hyper natremia, COVID-19 infection encephalopathy.  MRI was negative.  Hospital course was complicated by acute kidney injury and dysphagia.  Nephrology did not feel that patient was a hemodialysis candidate.  NG tube was placed but patient pulled out twice and was replaced for 3rd time.  Plan for PEG tube placement today.  Patient sister is trying to obtain healthcare power of attorney status.  She has decided to get PEG tube.  Patient's renal function has been worsening in the hospital.  As above she is not a HD candidate.  Patient is on furosemide 60 mg IV twice daily.    Subjective   Patient seen and examined, more alert and communicating today.  She knows she is in the hospital.   Assessment/Plan:     1. Acute metabolic encephalopathy-significantly improved, likely multifactorial from worsening renal function.  Patient's BUN is stable at 114 after stopping Lasix.  TSH, B12, EEG, CT head, MRI brain were negative for acute abnormality.  Patient is not a hemodialysis candidate due to multiple comorbidities as per nephrology. 2. Hypothermia-unclear etiology, resolved.  Patient  temperature dropped to 95 yesterday, improved with Bair hugger blanket.  Today she is back to baseline.  Mentally she is alert and oriented x3.  No signs symptoms of sepsis.  We will continue to monitor. 3. Oropharyngeal dysphagia-NG tube was placed twice which patient pulled out.  NG feeding tube was placed for third time.  NG feeding tube is currently out after PEG tube was placed.    Patient sister has requested PEG tube placement, palliative care was consulted however patient  sister does not want anymore palliative care discussion.  PEG tube placed on 2021-03-11. 4. Nonoliguric acute kidney injury on CKD stage IV-patient has progressive azotemia, possible uremia.  BUN has stabilized after stopping Lasix.  Today BUN is 114.  Renal ultrasound showed no finding.  Patient not hemodialysis candidate per nephrology.  Started on Lasix 60 mg IV twice daily for pulmonary edema.  Called and discussed with nephrology, Dr. Moshe Cipro, will hold Lasix at this time.  She discussed with patient's sister, patient is not a hemodialysis candidate. 5. Pancytopenia-patient has pancytopenia with leukopenia and thrombocytopenia.  FOBT was negative.  Anemia panel was consistent with anemia of chronic disease.  Likely from malnutrition.  Hemoglobin is stable at 8.0. 6. Anasarca-secondary to hypoalbuminemia-nephrology does not recommend giving albumin.  Lasix on hold due to worsening renal function as above. 7. COVID-19 infection-she tested positive on 01/02/2021.  Received remdesivir x3 doses.  No indication for airborne isolation. 8. Hypertension-continue Coreg 6.25 mg p.o. twice daily, hydralazine 50 mg 3 times daily. 9. Acute on chronic diastolic CHF-patient is currently requiring 2 L/min of oxygen by nasal cannula.  Chest x-ray done 2 days ago still shows pulmonary edema.  Lasix held due to worsening renal function.  Currently patient not requiring oxygen.  Will monitor. 10. Diabetes mellitus type 2-continue Lantus, sliding scale insulin NovoLog.  CBG well controlled.  Continue monitoring CBG every 4 hours. 11. History of CVA-patient has history of CVA in October 2021 with residual left hemiparesis and expressive aphasia.  CT head and MRI brain showed no acute finding. 12. Transaminitis-patient has mild  transaminitis, which is slowly improving.  Right upper quadrant ultrasound was unremarkable.  Hepatitis panel is pending. 13. Goals of care-discussed with patient's sister Ms. Cheek at bedside.   Discussed in detail regarding patient's condition and worsening azotemia.  Also discussed poor prognosis considering persistent lethargy, encephalopathy.  Patient is going for PEG tube placement today, and sister is hopeful that she might improve after she receives nutrition.  I discussed in detail need for intubation and mechanical ventilation in case patient develops acute respiratory failure, also discussed CPR in the event of cardiac arrest.  Also discussed the futility of these measures considering patient's worsening clinical condition.  Patient sister agrees for DNR.  Also patient sister had questions on why she is not a hemodialysis candidate.  Dr. Lyda Kalata discussed with patient sister, she is not a hemodialysis candidate.     COVID-19 Labs  No results for input(s): DDIMER, FERRITIN, LDH, CRP in the last 72 hours.  Lab Results  Component Value Date   SARSCOV2NAA POSITIVE (A) 01/02/2021   [place] NEGATIVE 11/24/2020   [place] NEGATIVE 10/28/2020   [place] NEGATIVE 10/25/2020     Scheduled medications:   . atorvastatin  80 mg Per Tube Daily  . carvedilol  6.25 mg Per NG tube BID WC  . chlorhexidine  15 mL Mouth Rinse BID  . dorzolamide-timolol  1 drop Both Eyes BID  . ferrous sulfate  300 mg Per Tube QODAY  . free water  200 mL Per Tube QID  . hydrALAZINE  50 mg Per Tube Q8H  . insulin aspart  0-9 Units Subcutaneous Q4H  . insulin glargine  5 Units Subcutaneous BID  . lactulose  20 g Per Tube BID  . lipase/protease/amylase)  20,880 Units Per Tube Once  . mouth rinse  15 mL Mouth Rinse q12n4p  . nystatin  5 mL Mouth/Throat QID  . pantoprazole sodium  40 mg Per Tube Daily  . sodium bicarbonate  650 mg Per Tube Once         CBG: Recent Labs  Lab 02/15/21 1708 02/15/21 1946 02/15/21 2349 02/16/21 0350 02/16/21 0813  GLUCAP 180* 164* 161* 167* 109*    SpO2: 99 % O2 Flow Rate (L/min): 2 L/min    CBC: Recent Labs  Lab 02/10/21 0344 02/11/21 0407  2021/02/15 0348 02/13/21 0400 02/16/21 0341  WBC 5.5 7.3 5.4 6.9 5.6  NEUTROABS 3.8 5.6 3.8  --   --   HGB 8.2* 8.7* 8.3* 8.2* 8.0*  HCT 25.5* 27.8* 27.0* 27.3* 26.8*  MCV 91.1 93.6 97.1 97.2 96.8  PLT 98* 122* 111* 118* 129*    Basic Metabolic Panel: Recent Labs  Lab 02/10/21 0344 02/11/21 0407 02-15-2021 0348 02/13/21 0400 02/14/21 0942 02/15/21 0359 02/16/21 0341  NA 135 138 137 141 143 141 143  K 3.9 4.0 3.8 4.0 3.8 3.7 3.6  CL 99 99 100 103 103 103 106  CO2 24 25 26 28 26  32 28  GLUCOSE 151* 136* 117* 94 160* 164* 165*  BUN 105* 103* 113* 112* 112* 114* 114*  CREATININE 2.85* 2.82* 2.93* 3.02* 2.98* 2.81* 2.55*  CALCIUM 9.1 9.4 9.1 9.1 9.3 9.0 9.1  MG 2.3 2.5* 2.4  --   --   --   --   PHOS 4.4 3.6 5.6*  --   --   --   --      Liver Function Tests: Recent Labs  Lab 02/10/21 0344 02/11/21 0407 02/15/2021 0348 02/13/21 0400 02/16/21 0341  AST 78* 69*  74* 66* 69*  ALT 103* 103* 95* 82* 48*  ALKPHOS 235* 283* 232* 209* 253*  BILITOT 0.8 0.5 0.5 0.7 0.6  PROT 6.5 7.3 6.8 6.8 6.7  ALBUMIN 2.4* 2.6* 2.5* 2.6* 2.4*     Antibiotics: Anti-infectives (From admission, onward)   Start     Dose/Rate Route Frequency Ordered Stop   March 02, 2021 1200  ceFAZolin (ANCEF) IVPB 2g/100 mL premix        2 g 200 mL/hr over 30 Minutes Intravenous  Once 02/11/21 1052 2021/03/02 1333   01/07/21 0800  ceFEPIme (MAXIPIME) 2 g in sodium chloride 0.9 % 100 mL IVPB  Status:  Discontinued        2 g 200 mL/hr over 30 Minutes Intravenous Daily 01/06/21 1159 01/08/21 1257   01/05/21 1500  vancomycin (VANCOCIN) IVPB 1000 mg/200 mL premix  Status:  Discontinued        1,000 mg 200 mL/hr over 60 Minutes Intravenous Every 24 hours 01/05/21 1357 01/05/21 1414   01/05/21 1500  vancomycin (VANCOCIN) IVPB 1000 mg/200 mL premix  Status:  Discontinued        1,000 mg 200 mL/hr over 60 Minutes Intravenous Every 48 hours 01/05/21 1414 01/06/21 1146   01/05/21 1445  ceFEPIme (MAXIPIME) 2 g in sodium  chloride 0.9 % 100 mL IVPB  Status:  Discontinued        2 g 200 mL/hr over 30 Minutes Intravenous Daily 01/05/21 1351 01/06/21 1146   01/03/21 1000  remdesivir 100 mg in sodium chloride 0.9 % 100 mL IVPB  Status:  Discontinued       "Followed by" Linked Group Details   100 mg 200 mL/hr over 30 Minutes Intravenous Daily 01/02/21 2257 01/04/21 1330   01/02/21 2300  remdesivir 200 mg in sodium chloride 0.9% 250 mL IVPB       "Followed by" Linked Group Details   200 mg 580 mL/hr over 30 Minutes Intravenous Once 01/02/21 2257 01/03/21 0147       DVT prophylaxis: Heparin  Code Status: Full code  Family Communication: Discussed with patient's sister on phone   Consultants:  IR  Procedures:  Peg tube placement    Objective   Vitals:   02/16/21 0347 02/16/21 0400 02/16/21 0753 02/16/21 1044  BP: (!) 176/59  (!) 169/57 (!) 174/67  Pulse: 70  65 69  Resp: 20     Temp: 98.2 F (36.8 C)     TempSrc: Oral     SpO2: 99%     Weight:  79.9 kg    Height:        Intake/Output Summary (Last 24 hours) at 02/16/2021 1059 Last data filed at 02/16/2021 0020 Gross per 24 hour  Intake 923.33 ml  Output 1200 ml  Net -276.67 ml    03/04 1901 - 03/06 0700 In: 2030.8  Out: 1600 [KKXFG:[id number]]  Filed Weights   02/13/21 0258 02/14/21 0236 02/16/21 0400  Weight: 82.6 kg 81.2 kg 79.9 kg    Physical Examination:   General-appears in no acute distress Heart-S1-S2, regular, no murmur auscultated Lungs-clear to auscultation bilaterally, no wheezing or crackles auscultated Abdomen-soft, nontender, no organomegaly Extremities-no edema in the lower extremities Neuro-alert, oriented x2, following commands  Status is: Inpatient  Dispo: The patient is from: Home              Anticipated d/c is to: Skilled nursing facility              Anticipated d/c date is: 02/17/2021  Patient currently not stable for discharge  Barrier to discharge-ongoing management for encephalopathy,  poor p.o. intake  Pressure Injury Sacrum Mid red open skin (Active)     Location: Sacrum  Location Orientation: Mid  Staging:   Wound Description (Comments): red open skin  Present on Admission: Yes      Cardiac Enzymes: No results for input(s): CKTOTAL, CKMB, CKMBINDEX, TROPONINI in the last 168 hours. BNP (last 3 results) Recent Labs    10/19/20 0001 01/02/21 1947  BNP 321.9* 536.1*       [place]   [facility] If 7PM-7AM, please contact night-coverage at [email or web address], Office  [phone]   02/16/2021, 10:59 AM  LOS: 44 days

## 2021-02-16 NOTE — TOC Progression Note (Signed)
Transition of Care [facility]) - Progression Note    Patient Details  Name: Desiree Pollard MRN: [id number] Date of Birth: 04/11/1946  Transition of Care [facility]) CM/SW [place], [place] Phone Number: [phone] 02/16/2021, 3:40 PM  Clinical Narrative:      Patient has not been seen by PT/OT since 01/22/2021.  [employer] requests recent PT/OT recommendations.  CSW requested PT/OT consult for patient, to request insurance authorization. Patient has bed offer at [facility] and [facility] but their preferred is [facility]. CSW reached out to [place] at [facility] requesting update on possible SNF placement. CSW updated patient's Cheek,Christine (Sister) [phone].     Expected Discharge Plan and Services                                                 Social Determinants of Health (SDOH) Interventions    Readmission Risk Interventions Readmission Risk Prevention Plan 01/13/2021  Transportation Screening Complete  PCP or Specialist Appt within 3-5 Days Complete  Social Work Consult for [place] Planning/Counseling Complete  Palliative Care Screening Complete  Medication Review Press photographer) Referral to Pharmacy

## 2021-02-17 DIAGNOSIS — E871 Hypo-osmolality and hyponatremia: Secondary | ICD-10-CM

## 2021-02-17 DIAGNOSIS — G9341 Metabolic encephalopathy: Secondary | ICD-10-CM | POA: Diagnosis not present

## 2021-02-17 DIAGNOSIS — N179 Acute kidney failure, unspecified: Secondary | ICD-10-CM | POA: Diagnosis not present

## 2021-02-17 DIAGNOSIS — R4701 Aphasia: Secondary | ICD-10-CM | POA: Diagnosis not present

## 2021-02-17 DIAGNOSIS — R9389 Abnormal findings on diagnostic imaging of other specified body structures: Secondary | ICD-10-CM | POA: Diagnosis not present

## 2021-02-17 LAB — SARS CORONAVIRUS 2 (TAT 6-24 HRS): SARS Coronavirus 2: NEGATIVE

## 2021-02-17 LAB — BASIC METABOLIC PANEL
Anion gap: 14 (ref 5–15)
BUN: 101 mg/dL — ABNORMAL HIGH (ref 8–23)
CO2: 27 mmol/L (ref 22–32)
Calcium: 9.4 mg/dL (ref 8.9–10.3)
Chloride: 107 mmol/L (ref 98–111)
Creatinine, Ser: 2.51 mg/dL — ABNORMAL HIGH (ref 0.44–1.00)
GFR, Estimated: 19 mL/min — ABNORMAL LOW (ref >60)
Glucose, Bld: 167 mg/dL — ABNORMAL HIGH (ref 70–99)
Potassium: 3.6 mmol/L (ref 3.5–5.1)
Sodium: 148 mmol/L — ABNORMAL HIGH (ref 135–145)

## 2021-02-17 LAB — GLUCOSE, CAPILLARY
Glucose-Capillary: 154 mg/dL — ABNORMAL HIGH (ref 70–99)
Glucose-Capillary: 141 mg/dL — ABNORMAL HIGH (ref 70–99)
Glucose-Capillary: 162 mg/dL — ABNORMAL HIGH (ref 70–99)
Glucose-Capillary: 162 mg/dL — ABNORMAL HIGH (ref 70–99)
Glucose-Capillary: 154 mg/dL — ABNORMAL HIGH (ref 70–99)

## 2021-02-17 MED ORDER — FREE WATER
200.0000 mL | Status: DC
  Administered 2021-02-17 – 2021-02-19 (×10): 200 mL

## 2021-02-17 NOTE — Progress Notes (Signed)
When attempting to give pt meds per tube, feeding began backflowing out of the tube. Abdomen WNL, no vomiting. Successfully flushed, but fluid continued to backflow. Held for 30 minutes. After 30 min, problem persist. Contacted MD, advised to hold for 1 hour and recheck. Will continue to monitor.

## 2021-02-17 NOTE — Progress Notes (Signed)
[facility]  PROGRESS NOTE  Desiree Pollard JIR:[id number] DOB: Aug 07, 1946 DOA: 01/02/2021 PCP: [name], FNP   Brief HPI:   75 year old female with history of CVA with left hemiparesis, expressive aphasia, dementia, CKD stage IV, complete heart block status post pacemaker placement who was admitted from skilled nursing facility with hyper natremia, COVID-19 infection encephalopathy.  MRI was negative.  Hospital course was complicated by acute kidney injury and dysphagia.  Nephrology did not feel that patient was a hemodialysis candidate.  NG tube was placed but patient pulled out twice and was replaced for 3rd time.  Plan for PEG tube placement today.  Patient sister is trying to obtain healthcare power of attorney status.  She has decided to get PEG tube.  Patient's renal function has been worsening in the hospital.  As above she is not a HD candidate.  Patient is on furosemide 60 mg IV twice daily.    Subjective   Patient seen and examined, she is somnolent but arousable.   Assessment/Plan:     1. Acute metabolic encephalopathy-significantly improved, likely multifactorial from worsening renal function.  Patient's BUN is stable at 101after stopping Lasix.  TSH, B12, EEG, CT head, MRI brain were negative for acute abnormality.  Patient is not a hemodialysis candidate due to multiple comorbidities as per nephrology. 2. Hypothermia-unclear etiology, resolved.  Patient  temperature dropped to 95 yesterday, improved with Bair hugger blanket.  Today she is back to baseline.  Mentally she is alert and oriented x3.  No signs symptoms of sepsis.  We will continue to monitor. 3. Oropharyngeal dysphagia-NG tube was placed twice which patient pulled out.  NG feeding tube was placed for third time.  NG feeding tube is currently out after PEG tube was placed.    Patient sister had requested PEG tube placement, palliative care was consulted however patient sister does not want anymore palliative  care discussion.  PEG tube placed on 2021/02/19. 4. Nonoliguric acute kidney injury on CKD stage IV-patient has progressive azotemia, possible uremia.  BUN has stabilized after stopping Lasix.  Today BUN is 101.  Creatinine improved to 2.51.  Renal ultrasound showed no finding.  Patient not hemodialysis candidate per nephrology.  Started on Lasix 60 mg IV twice daily for pulmonary edema.  Called and discussed with nephrology, Dr. Moshe Cipro, will hold Lasix at this time.  She discussed with patient's sister, patient is not a hemodialysis candidate. 5. Hypernatremia-patient has mild hypernatremia.  Will increase the frequency of free water to 200 cc every 4 hours.  Follow BMP in am. 6. Pancytopenia-patient has pancytopenia with leukopenia and thrombocytopenia.  FOBT was negative.  Anemia panel was consistent with anemia of chronic disease.  Likely from malnutrition.  Hemoglobin is stable at 8.0. 7. Anasarca-secondary to hypoalbuminemia-nephrology does not recommend giving albumin.  Lasix on hold due to worsening renal function as above. 8. COVID-19 infection-she tested positive on 01/02/2021.  Received remdesivir x3 doses.  No indication for airborne isolation. 9. Hypertension-continue Coreg 6.25 mg p.o. twice daily, hydralazine 50 mg 3 times daily. 10. Acute on chronic diastolic CHF-patient is currently requiring 2 L/min of oxygen by nasal cannula.  Chest x-ray done 2 days ago still shows pulmonary edema.  Lasix held due to worsening renal function.  Currently patient not requiring oxygen.  Will monitor. 11. Diabetes mellitus type 2-continue Lantus, sliding scale insulin NovoLog.  CBG well controlled.  Continue monitoring CBG every 4 hours. 12. History of CVA-patient has history of CVA in October 2021 with  residual left hemiparesis and expressive aphasia.  CT head and MRI brain showed no acute finding. 72. Transaminitis-patient has mild transaminitis, which is slowly improving.  Right upper quadrant  ultrasound was unremarkable.  Hepatitis panel is pending. 14. Goals of care-discussed with patient's sister Ms. Cheek at bedside.  Discussed in detail regarding patient's condition and worsening azotemia.  Also discussed poor prognosis considering persistent lethargy, encephalopathy.  Patient is going for PEG tube placement today, and sister is hopeful that she might improve after she receives nutrition.  I discussed in detail need for intubation and mechanical ventilation in case patient develops acute respiratory failure, also discussed CPR in the event of cardiac arrest.  Also discussed the futility of these measures considering patient's worsening clinical condition.  Patient sister agrees for DNR.  Also patient sister had questions on why she is not a hemodialysis candidate.  Dr. Lyda Kalata discussed with patient sister, she is not a hemodialysis candidate.     COVID-19 Labs  No results for input(s): DDIMER, FERRITIN, LDH, CRP in the last 72 hours.  Lab Results  Component Value Date   SARSCOV2NAA NEGATIVE 02/16/2021   SARSCOV2NAA POSITIVE (A) 01/02/2021   SARSCOV2NAA NEGATIVE 11/24/2020   [place] NEGATIVE 10/28/2020     Scheduled medications:   . amLODipine  10 mg Oral Daily  . atorvastatin  80 mg Per Tube Daily  . carvedilol  6.25 mg Per NG tube BID WC  . chlorhexidine  15 mL Mouth Rinse BID  . dorzolamide-timolol  1 drop Both Eyes BID  . ferrous sulfate  300 mg Per Tube QODAY  . free water  200 mL Per Tube QID  . hydrALAZINE  50 mg Per Tube Q8H  . insulin aspart  0-9 Units Subcutaneous Q4H  . insulin glargine  5 Units Subcutaneous BID  . lactulose  20 g Per Tube BID  . lipase/protease/amylase)  20,880 Units Per Tube Once  . mouth rinse  15 mL Mouth Rinse q12n4p  . nystatin  5 mL Mouth/Throat QID  . pantoprazole sodium  40 mg Per Tube Daily  . sodium bicarbonate  650 mg Per Tube Once         CBG: Recent Labs  Lab 02/16/21 1958 02/16/21 2357 02/17/21 0358  02/17/21 0732 02/17/21 1144  GLUCAP 147* 160* 154* 141* 162*    SpO2: 97 % O2 Flow Rate (L/min): 2 L/min    CBC: Recent Labs  Lab 02/11/21 0407 03-02-2021 0348 02/13/21 0400 02/16/21 0341  WBC 7.3 5.4 6.9 5.6  NEUTROABS 5.6 3.8  --   --   HGB 8.7* 8.3* 8.2* 8.0*  HCT 27.8* 27.0* 27.3* 26.8*  MCV 93.6 97.1 97.2 96.8  PLT 122* 111* 118* 129*    Basic Metabolic Panel: Recent Labs  Lab 02/11/21 0407 03/02/2021 0348 02/13/21 0400 02/14/21 0942 02/15/21 0359 02/16/21 0341 02/17/21 0843  NA 138 137 141 143 141 143 148*  K 4.0 3.8 4.0 3.8 3.7 3.6 3.6  CL 99 100 103 103 103 106 107  CO2 25 26 28 26  32 28 27  GLUCOSE 136* 117* 94 160* 164* 165* 167*  BUN 103* 113* 112* 112* 114* 114* 101*  CREATININE 2.82* 2.93* 3.02* 2.98* 2.81* 2.55* 2.51*  CALCIUM 9.4 9.1 9.1 9.3 9.0 9.1 9.4  MG 2.5* 2.4  --   --   --   --   --   PHOS 3.6 5.6*  --   --   --   --   --  Liver Function Tests: Recent Labs  Lab 02/11/21 0407 2021-03-05 0348 02/13/21 0400 02/16/21 0341  AST 69* 74* 66* 69*  ALT 103* 95* 82* 48*  ALKPHOS 283* 232* 209* 253*  BILITOT 0.5 0.5 0.7 0.6  PROT 7.3 6.8 6.8 6.7  ALBUMIN 2.6* 2.5* 2.6* 2.4*     Antibiotics: Anti-infectives (From admission, onward)   Start     Dose/Rate Route Frequency Ordered Stop   05-Mar-2021 1200  ceFAZolin (ANCEF) IVPB 2g/100 mL premix        2 g 200 mL/hr over 30 Minutes Intravenous  Once 02/11/21 1052 Mar 05, 2021 1333   01/07/21 0800  ceFEPIme (MAXIPIME) 2 g in sodium chloride 0.9 % 100 mL IVPB  Status:  Discontinued        2 g 200 mL/hr over 30 Minutes Intravenous Daily 01/06/21 1159 01/08/21 1257   01/05/21 1500  vancomycin (VANCOCIN) IVPB 1000 mg/200 mL premix  Status:  Discontinued        1,000 mg 200 mL/hr over 60 Minutes Intravenous Every 24 hours 01/05/21 1357 01/05/21 1414   01/05/21 1500  vancomycin (VANCOCIN) IVPB 1000 mg/200 mL premix  Status:  Discontinued        1,000 mg 200 mL/hr over 60 Minutes Intravenous Every 48  hours 01/05/21 1414 01/06/21 1146   01/05/21 1445  ceFEPIme (MAXIPIME) 2 g in sodium chloride 0.9 % 100 mL IVPB  Status:  Discontinued        2 g 200 mL/hr over 30 Minutes Intravenous Daily 01/05/21 1351 01/06/21 1146   01/03/21 1000  remdesivir 100 mg in sodium chloride 0.9 % 100 mL IVPB  Status:  Discontinued       "Followed by" Linked Group Details   100 mg 200 mL/hr over 30 Minutes Intravenous Daily 01/02/21 2257 01/04/21 1330   01/02/21 2300  remdesivir 200 mg in sodium chloride 0.9% 250 mL IVPB       "Followed by" Linked Group Details   200 mg 580 mL/hr over 30 Minutes Intravenous Once 01/02/21 2257 01/03/21 0147       DVT prophylaxis: Heparin  Code Status: Full code  Family Communication: Discussed with patient's sister on phone   Consultants:  IR  Procedures:  Peg tube placement    Objective   Vitals:   02/16/21 2108 02/17/21 0000 02/17/21 0401 02/17/21 1254  BP: (!) 167/58 (!) 161/51 (!) 152/59 (!) 170/56  Pulse:  72 68 74  Resp:  20 20 (!) 32  Temp:  98.5 F (36.9 C) 98.2 F (36.8 C) 98.2 F (36.8 C)  TempSrc:  Oral Oral Oral  SpO2:  98% 97% 97%  Weight:   78.9 kg   Height:        Intake/Output Summary (Last 24 hours) at 02/17/2021 1317 Last data filed at 02/17/2021 0400 Gross per 24 hour  Intake --  Output 1400 ml  Net -1400 ml    03/05 1901 - 03/07 0700 In: 60  Out: 1600 [Urine:1600]  Filed Weights   02/14/21 0236 02/16/21 0400 02/17/21 0401  Weight: 81.2 kg 79.9 kg 78.9 kg    Physical Examination:  General-appears in no acute distress Heart-S1-S2, regular, no murmur auscultated Lungs-clear to auscultation bilaterally, no wheezing or crackles auscultated Abdomen-soft, nontender, no organomegaly Extremities-no edema in the lower extremities Neuro-somnolent but arousable, not following commands  Status is: Inpatient  Dispo: The patient is from: Home              Anticipated d/c is to: Skilled nursing  facility               Anticipated d/c date is: 02/18/2021              Patient currently not stable for discharge  Barrier to discharge-ongoing management for encephalopathy, poor p.o. intake  Pressure Injury Sacrum Mid red open skin (Active)     Location: Sacrum  Location Orientation: Mid  Staging:   Wound Description (Comments): red open skin  Present on Admission: Yes      Cardiac Enzymes: No results for input(s): CKTOTAL, CKMB, CKMBINDEX, TROPONINI in the last 168 hours. BNP (last 3 results) Recent Labs    10/19/20 0001 01/02/21 1947  BNP 321.9* 536.1*       [place]   [facility] If 7PM-7AM, please contact night-coverage at [email or web address], Office  [phone]   02/17/2021, 1:17 PM  LOS: 45 days

## 2021-02-17 NOTE — Progress Notes (Signed)
Nutrition Follow-up  DOCUMENTATION CODES:   Not applicable  INTERVENTION:  - continue Nepro @ 50 ml/hr with 200 ml free water every 4 hours.  NUTRITION DIAGNOSIS:   Increased nutrient needs related to acute illness (COVID-19 infection) as evidenced by estimated needs. -ongoing, now post-COVID but with prolonged hospitalization (45 days)  GOAL:   Patient will meet greater than or equal to 90% of their needs -met with TF  MONITOR:   TF tolerance,Labs,Weight trends,Skin  ASSESSMENT:   75 year old female with past medical history significant for previous stroke with residual left-sided weakness 10/21, HTN, breast cancer 2017, expressive aphasia, dementia, CKD stage IV and diabetes who presents from her nursing facility due to altered mental status and was found to have hypernatremia as well as COVID-19.  Significant Events:  1/21- admission 1/27- TF initiation 2/9- TF formula changed from Osmolite 1.5 to Nepro 2/17- diet advanced from NPO to Dysphasia 1, thin liquids at noon 2/18- TF changed to be over 18 hours/day; Magic Cup and Ensure ordered 2/23- TF changed back to be over 24 hours/day; Ensure d/c'd 3/2- G-tube placement    Patient laying in bed with no family or visitors present. Patient has G-tube in place and is receiving TF regimen at goal: Nepro @ 50 ml/hr with 200 ml free water every 4 hours. This regimen is providing 2160 kcal, 97 grams protein, and 2072 ml free water.   Weight today is down from recently but is consistent with weight during the first week of hospitalization. Moderate pitting edema to all extremities documented in the edema section of flow sheet.   Palliative Care signed off earlier today.   Plan is for SNF at the time of d/c and this may occur as early as 3/8.    Labs reviewed; CBGs: 154, 141, 162 mg/dl, Na: 148 mmol/l, BUN: 101 mg/dl, creatinine: 2.51 mg/dl.  Medications reviewed; 300 mg ferrous sulfate/day, sliding scale novolog, 5 units lantus  BID, 5 ml mycostatin QID, 40 mg protonix/day.   Diet Order:   Diet Order            Diet NPO time specified  Diet effective midnight                 EDUCATION NEEDS:   Not appropriate for education at this time  Skin:  Skin Assessment: Reviewed RN Assessment  Last BM:  3/6 (type 6 x1)  Height:   Ht Readings from Last 1 Encounters:  01/03/21 5' 7"  (1.702 m)    Weight:   Wt Readings from Last 1 Encounters:  02/17/21 78.9 kg    Estimated Nutritional Needs:  Kcal:  1800-2100kcal/day Protein:  90-105g/day Fluid:  1.6-1.8L/day      Jarome Matin, MS, RD, LDN, CNSC Inpatient Clinical Dietitian RD pager # available in [facility]  After hours/weekend pager # available in [facility]

## 2021-02-17 NOTE — Progress Notes (Signed)
Andrena Mews, Admission Coordinator Principal, an affiliate of [employer] [facility], called to complete a"courtesy" follow up assessment on patient. Visit was denied, due to conflict of concern for readmission to facility, after receiving report family does not want pt to return to [facility]. Advised Ms. Medley to reach out to patient sister, Trudi Ida for questions concerning pt. Ms. Reather Laurence declined advice and phone call ended. SRP, RN

## 2021-02-17 NOTE — TOC Progression Note (Signed)
Transition of Care [facility]) - Progression Note    Patient Details  Name: Verne Cove MRN: [id number] Date of Birth: November 27, 1946  [facility]) CM/SW Contact  Purcell Mouton, RN Phone Number: 02/17/2021, 1:29 PM  Clinical Narrative:    Insurance authorization started with Everlene Balls [id number].         Expected Discharge Plan and Services                                                 Social Determinants of Health (SDOH) Interventions    Readmission Risk Interventions Readmission Risk Prevention Plan 01/13/2021  Transportation Screening Complete  PCP or Specialist Appt within 3-5 Days Complete  Social Work Consult for [place] Planning/Counseling Complete  Palliative Care Screening Complete  Medication Review Press photographer) Referral to Pharmacy

## 2021-02-17 NOTE — Evaluation (Addendum)
Physical Therapy Re-Evaluation Patient Details Name: Desiree Pollard MRN: [id number] DOB: [birth year] Today's Date: 02/17/2021   History of Present Illness  [place] Culp is a 75 y.o. female with medical history significant of strokes, DM2, HTN, dementia, CKD4.Per sister: pt with AMS since she visited on 1/17 which is new.  Does have left sided deficits and aphasia at baseline,patient presented 01/02/21 secondary to altered mental status. Also with hypernatremia and COVID-19 infection.  Hospital course was complicated by acute kidney injury and dysphagia.  Nephrology did not feel that patient was a hemodialysis candidate.  NG tube was placed but patient pulled out twice and was replaced for 3rd time.  Pt s/p PEG tube placement.  Patient was a resident at [facility] term.  Clinical Impression  Patient evaluated again by Physical Therapy with no further acute PT needs identified.  Pt difficult to arouse and only briefly awakened when assisted with sliding upright in bed (to keep HOB >30* precaution due to PEG).  Pt mostly kept eyes closed and had no consistent ability to follow commands.  Pt does not appear able to participate in physical therapy so recommend total assist care upon d/c. See below for any follow-up Physical Therapy or equipment needs. PT is signing off. Thank you for this referral.     Follow Up Recommendations Other (comment) (will need total care, does not appear to be rehab candidate)    Equipment Recommendations  None recommended by PT    Recommendations for Other Services       Precautions / Restrictions Precautions Precaution Comments: PEG, hx left hemiparesis, expressive aphasia      Mobility  Bed Mobility               General bed mobility comments: not attempted however based on cognition and inability to assist with moving extremities, appears total assist    Transfers                    Ambulation/Gait                Stairs             Wheelchair Mobility    Modified Rankin (Stroke Patients Only)       Balance                                             Pertinent Vitals/Pain Pain Assessment: Faces Faces Pain Scale: No hurt Pain Intervention(s): Monitored during session;Repositioned    Home Living Family/patient expects to be discharged to:: Skilled nursing facility                      Prior Function Level of Independence: Needs assistance   Gait / Transfers Assistance Needed: ambulatory with RW prior to CVA 12/21, assume total care at SNF           Hand Dominance        Extremity/Trunk Assessment   Upper Extremity Assessment Upper Extremity Assessment: LUE deficits/detail;RUE deficits/detail RUE Deficits / Details: does not assist with movement LUE Deficits / Details: flexion tone present, able to perform extension, pt does not assist, hx of deficits from CVA    Lower Extremity Assessment Lower Extremity Assessment: RLE deficits/detail;LLE deficits/detail RLE Deficits / Details: bilaterally not assisting with movement       Communication   Communication: Expressive  difficulties;Receptive difficulties  Cognition Arousal/Alertness: Lethargic   Overall Cognitive Status: No family/caregiver present to determine baseline cognitive functioning                                 General Comments: mostly keeps eyes closed, briefly opened eyes however no verbalizations, inconsistent with following commands      General Comments      Exercises     Assessment/Plan    PT Assessment Patent does not need any further PT services  PT Problem List         PT Treatment Interventions      PT Goals (Current goals can be found in the Care Plan section)  Acute Rehab PT Goals PT Goal Formulation: All assessment and education complete, DC therapy    Frequency     Barriers to discharge        Co-evaluation               AM-PAC PT "6  Clicks" Mobility  Outcome Measure Help needed turning from your back to your side while in a flat bed without using bedrails?: Total Help needed moving from lying on your back to sitting on the side of a flat bed without using bedrails?: Total Help needed moving to and from a bed to a chair (including a wheelchair)?: Total Help needed standing up from a chair using your arms (e.g., wheelchair or bedside chair)?: Total Help needed to walk in hospital room?: Total Help needed climbing 3-5 steps with a railing? : Total 6 Click Score: 6    End of Session   Activity Tolerance: Patient limited by lethargy Patient left: in bed;with call bell/phone within reach;with bed alarm set  Pt left with heels floating end of session. PT Visit Diagnosis: Other symptoms and signs involving the nervous system (R29.898)    Time: 0630-1601 PT Time Calculation (min) (ACUTE ONLY): 10 min   Charges:   PT Evaluation $PT Re-evaluation: 1 Re-eval        Kati PT, DPT Acute Rehabilitation Services Pager: [phone] Office: [phone] [place] Ram E 02/17/2021, 12:25 PM

## 2021-02-17 NOTE — Progress Notes (Signed)
Palliative Medicine RN Note: Discussed pt in team rounds. Palliative-related issues have been addressed, and GOC are clear. At this time, PMT has nothing more to offer and will sign off.  If Mrs Hefley's family asks to talk to [place] about changing [place], please re-consult our team.  Marjie Skiff. Molina Hollenback, RN, BSN, [facility] Palliative Medicine Team 02/17/2021 9:43 AM Office [phone]

## 2021-02-17 NOTE — Plan of Care (Signed)

## 2021-02-18 DIAGNOSIS — N179 Acute kidney failure, unspecified: Secondary | ICD-10-CM | POA: Diagnosis not present

## 2021-02-18 DIAGNOSIS — G9341 Metabolic encephalopathy: Secondary | ICD-10-CM | POA: Diagnosis not present

## 2021-02-18 DIAGNOSIS — R945 Abnormal results of liver function studies: Secondary | ICD-10-CM | POA: Diagnosis not present

## 2021-02-18 DIAGNOSIS — N184 Chronic kidney disease, stage 4 (severe): Secondary | ICD-10-CM | POA: Diagnosis not present

## 2021-02-18 LAB — BASIC METABOLIC PANEL
Anion gap: 9 (ref 5–15)
BUN: 102 mg/dL — ABNORMAL HIGH (ref 8–23)
CO2: 28 mmol/L (ref 22–32)
Calcium: 9.1 mg/dL (ref 8.9–10.3)
Chloride: 107 mmol/L (ref 98–111)
Creatinine, Ser: 2.57 mg/dL — ABNORMAL HIGH (ref 0.44–1.00)
GFR, Estimated: 19 mL/min — ABNORMAL LOW (ref >60)
Glucose, Bld: 128 mg/dL — ABNORMAL HIGH (ref 70–99)
Potassium: 3.6 mmol/L (ref 3.5–5.1)
Sodium: 144 mmol/L (ref 135–145)

## 2021-02-18 LAB — GLUCOSE, CAPILLARY
Glucose-Capillary: 118 mg/dL — ABNORMAL HIGH (ref 70–99)
Glucose-Capillary: 118 mg/dL — ABNORMAL HIGH (ref 70–99)
Glucose-Capillary: 128 mg/dL — ABNORMAL HIGH (ref 70–99)
Glucose-Capillary: 128 mg/dL — ABNORMAL HIGH (ref 70–99)
Glucose-Capillary: 137 mg/dL — ABNORMAL HIGH (ref 70–99)
Glucose-Capillary: 149 mg/dL — ABNORMAL HIGH (ref 70–99)

## 2021-02-18 NOTE — Progress Notes (Signed)
Sister in to visit with patient. Pt open eyes when sister call out pt name.......voice recognition. Pt does not appear to track consistently to name and familiar face, however, responds to voice recognition and attempt to mouth some words. SRP, RN

## 2021-02-18 NOTE — TOC Progression Note (Signed)
Transition of Care [facility]) - Progression Note    Patient Details  Name: Desiree Pollard MRN: [id number] Date of Birth: [birth year]  Transition of Care [facility]) CM/SW Contact  Joaquin Courts, RN Phone Number: 02/18/2021, 9:35 AM  Clinical Narrative:    CM received notification that navi requests additional clinicals.  All requested clinicals faxed, authorization is still pending at this time.        Expected Discharge Plan and Services                                                 Social Determinants of Health (SDOH) Interventions    Readmission Risk Interventions Readmission Risk Prevention Plan 01/13/2021  Transportation Screening Complete  PCP or Specialist Appt within 3-5 Days Complete  Social Work Consult for [place] Planning/Counseling Complete  Palliative Care Screening Complete  Medication Review Press photographer) Referral to Pharmacy

## 2021-02-18 NOTE — Progress Notes (Signed)
SLP Cancellation Note  Patient Details Name: Desiree Pollard MRN: [id number] DOB: May 10, 1946   Cancelled treatment:       Reason Eval/Treat Not Completed: Other (comment);Medical issues which prohibited therapy  Pt NPO, decreased tolerance of tube feeding.   Kathleen Lime, MS [facility] SLP Acute Rehab Services Office [phone] Pager [phone]   [name] 02/18/2021, 1:24 PM

## 2021-02-18 NOTE — Plan of Care (Signed)

## 2021-02-18 NOTE — TOC Progression Note (Signed)
Transition of Care [facility]) - Progression Note    Patient Details  Name: Patric Vanpelt MRN: [id number] Date of Birth: 31-Oct-1946  Transition of Care [facility]) CM/SW Contact  Joaquin Courts, RN Phone Number: 02/18/2021, 2:18 PM  Clinical Narrative:    CM called and followed up with Navi re authorization.  Navi rep states additional clinicals have not been received.  CM has in hand confirmation of successful fax transfer from [phone] this morning for the requested clinicals.  CM provided requested information verbally and re-faxed clinicals once more.  CM additionally asked for this authorization request to be expedited if possible.          Expected Discharge Plan and Services                                                 Social Determinants of Health (SDOH) Interventions    Readmission Risk Interventions Readmission Risk Prevention Plan 02/18/2021 01/13/2021  Transportation Screening Complete Complete  PCP or Specialist Appt within 3-5 Days - Complete  Social Work Consult for [place] Planning/Counseling - Complete  Palliative Care Screening - Complete  Medication Review Press photographer) Complete Referral to Pharmacy  PCP or Specialist appointment within 3-5 days of discharge Complete -  [place] or Home Care Consult Complete -  SW Recovery Care/Counseling Consult Complete -  Palliative Care Screening Not Applicable -  [place] Complete -

## 2021-02-18 NOTE — TOC Progression Note (Addendum)
Transition of Care [facility]) - Progression Note    Patient Details  Name: Desiree Pollard MRN: [id number] Date of Birth: 1946-04-22  [facility] of [facility]) CM/SW Contact  Joaquin Courts, RN Phone Number: 02/18/2021, 4:19 PM  Clinical Narrative:    CM received insurance authorization from [place], reference ID [id number], approved from 02/18/21-02/20/21, reauthorization clinicals can be sent to [phone] to the attention of Darnelle Bos.    CM reached out to [place], who states facility can accept patient tomorrow 3/9. MD notified patient will need an updated covid test.         Expected Discharge Plan and Services                                                 Social Determinants of Health (SDOH) Interventions    Readmission Risk Interventions Readmission Risk Prevention Plan 02/18/2021 01/13/2021  Transportation Screening Complete Complete  PCP or Specialist Appt within 3-5 Days - Complete  Social Work Consult for [place] Planning/Counseling - Complete  Palliative Care Screening - Complete  Medication Review Press photographer) Complete Referral to Pharmacy  PCP or Specialist appointment within 3-5 days of discharge Complete -  [place] or Home Care Consult Complete -  SW Recovery Care/Counseling Consult Complete -  Palliative Care Screening Not Applicable -  [place] Complete -

## 2021-02-18 NOTE — Progress Notes (Signed)
[facility]  PROGRESS NOTE  Vermont Rampy QMV:[id number] DOB: [birth year] DOA: 01/02/2021 PCP: [name], FNP   Brief HPI:   75 year old female with history of CVA with left hemiparesis, expressive aphasia, dementia, CKD stage IV, complete heart block status post pacemaker placement who was admitted from skilled nursing facility with hyper natremia, COVID-19 infection encephalopathy.  MRI was negative.  Hospital course was complicated by acute kidney injury and dysphagia.  Nephrology did not feel that patient was a hemodialysis candidate.  NG tube was placed but patient pulled out twice and was replaced for 3rd time.  Plan for PEG tube placement today.  Patient sister is trying to obtain healthcare power of attorney status.  She has decided to get PEG tube.  Patient's renal function has been worsening in the hospital.  As above she is not a HD candidate.  Patient was placed on IV furosemide for pulmonary edema.  She had worsening renal function so Lasix was discontinued.  Nephrology discussed with patient's sister and told her that she is not a good hemodialysis candidate.  She was made DNR.  Plan will be to go back to the nursing facility.  She has a PEG tube in place and is getting tube feeds.   Subjective   Patient seen and examined, somnolent but arousable.  Denies any complaints.   Assessment/Plan:     1. Acute metabolic encephalopathy-significantly improved, likely multifactorial from worsening renal function.  Patient's BUN is stable at 101after stopping Lasix.  TSH, B12, EEG, CT head, MRI brain were negative for acute abnormality.  Patient is not a hemodialysis candidate due to multiple comorbidities as per nephrology. 2. Hypothermia-unclear etiology, resolved.  Patient  temperature dropped to 95 yesterday, improved with Bair hugger blanket.  Today she is back to baseline.  Mentally she is alert and oriented x3.  No signs symptoms of sepsis.  We will continue to  monitor. 3. Oropharyngeal dysphagia-NG tube was placed twice which patient pulled out.  NG feeding tube was placed for third time.  NG feeding tube is currently out after PEG tube was placed.    Patient sister had requested PEG tube placement, palliative care was consulted however patient sister does not want anymore palliative care discussion.  PEG tube placed on 02-26-21. 4. Nonoliguric acute kidney injury on CKD stage IV-patient has progressive azotemia, possible uremia.  BUN has stabilized after stopping Lasix.  Today BUN is 102.  Creatinine improved to 2.57.  Renal ultrasound showed no finding.  Patient not hemodialysis candidate per nephrology.  She was started  on Lasix 60 mg IV twice daily for pulmonary edema.  Called and discussed with nephrology, Dr. Moshe Cipro, will hold Lasix at this time.  She discussed with patient's sister, patient is not a hemodialysis candidate.  Will not resume Lasix at discharge. 5. Hypernatremia-patient has mild hypernatremia.  Improved after Free water changed to 200 cc every 4 hours.   Follow BMP in am. 6. Pancytopenia-patient has pancytopenia with leukopenia and thrombocytopenia.  FOBT was negative.  Anemia panel was consistent with anemia of chronic disease.  Likely from malnutrition.  Hemoglobin is stable at 8.0. 7. Anasarca-secondary to hypoalbuminemia-nephrology does not recommend giving albumin.  Lasix on hold due to worsening renal function as above. 8. COVID-19 infection-she tested positive on 01/02/2021.  Received remdesivir x3 doses.  No indication for airborne isolation. 9. Hypertension-continue Coreg 6.25 mg p.o. twice daily, hydralazine 50 mg 3 times daily. 10. Acute on chronic diastolic CHF-patient is currently requiring 2 L/min  of oxygen by nasal cannula.  Chest x-ray done 2 days ago still shows pulmonary edema.  Lasix held due to worsening renal function.  Currently patient not requiring oxygen.  Will monitor. 11. Diabetes mellitus type 2-continue  Lantus, sliding scale insulin NovoLog.  CBG well controlled.  Continue monitoring CBG every 4 hours. 12. History of CVA-patient has history of CVA in October 2021 with residual left hemiparesis and expressive aphasia.  CT head and MRI brain showed no acute finding. 46. Transaminitis-patient has mild transaminitis, which is slowly improving.  Right upper quadrant ultrasound was unremarkable.  Hepatitis panel is pending. 14. Goals of care-discussed with patient's sister Ms. Cheek at bedside.  Discussed in detail regarding patient's condition and worsening azotemia.  Also discussed poor prognosis considering persistent lethargy, encephalopathy.  Patient is going for PEG tube placement today, and sister is hopeful that she might improve after she receives nutrition.  I discussed in detail need for intubation and mechanical ventilation in case patient develops acute respiratory failure, also discussed CPR in the event of cardiac arrest.  Also discussed the futility of these measures considering patient's worsening clinical condition.  Patient sister agrees for DNR.  Also patient sister had questions on why she is not a hemodialysis candidate.  Dr. Lyda Kalata discussed with patient sister, she is not a hemodialysis candidate.     COVID-19 Labs  No results for input(s): DDIMER, FERRITIN, LDH, CRP in the last 72 hours.  Lab Results  Component Value Date   SARSCOV2NAA NEGATIVE 02/16/2021   SARSCOV2NAA POSITIVE (A) 01/02/2021   SARSCOV2NAA NEGATIVE 11/24/2020   [place] NEGATIVE 10/28/2020     Scheduled medications:   . amLODipine  10 mg Oral Daily  . atorvastatin  80 mg Per Tube Daily  . carvedilol  6.25 mg Per NG tube BID WC  . chlorhexidine  15 mL Mouth Rinse BID  . dorzolamide-timolol  1 drop Both Eyes BID  . ferrous sulfate  300 mg Per Tube QODAY  . free water  200 mL Per Tube Q4H  . hydrALAZINE  50 mg Per Tube Q8H  . insulin aspart  0-9 Units Subcutaneous Q4H  . insulin glargine  5 Units  Subcutaneous BID  . lactulose  20 g Per Tube BID  . lipase/protease/amylase)  20,880 Units Per Tube Once  . mouth rinse  15 mL Mouth Rinse q12n4p  . nystatin  5 mL Mouth/Throat QID  . pantoprazole sodium  40 mg Per Tube Daily  . sodium bicarbonate  650 mg Per Tube Once         CBG: Recent Labs  Lab 02/17/21 2011 02/18/21 0003 02/18/21 0405 02/18/21 0743 02/18/21 1228  GLUCAP 154* 137* 118* 128* 128*    SpO2: 96 % O2 Flow Rate (L/min): 2 L/min    CBC: Recent Labs  Lab 03/11/21 0348 02/13/21 0400 02/16/21 0341  WBC 5.4 6.9 5.6  NEUTROABS 3.8  --   --   HGB 8.3* 8.2* 8.0*  HCT 27.0* 27.3* 26.8*  MCV 97.1 97.2 96.8  PLT 111* 118* 129*    Basic Metabolic Panel: Recent Labs  Lab 11-Mar-2021 0348 02/13/21 0400 02/14/21 0942 02/15/21 0359 02/16/21 0341 02/17/21 0843 02/18/21 0421  NA 137   < > 143 141 143 148* 144  K 3.8   < > 3.8 3.7 3.6 3.6 3.6  CL 100   < > 103 103 106 107 107  CO2 26   < > 26 32 28 27 28   GLUCOSE 117*   < >  160* 164* 165* 167* 128*  BUN 113*   < > 112* 114* 114* 101* 102*  CREATININE 2.93*   < > 2.98* 2.81* 2.55* 2.51* 2.57*  CALCIUM 9.1   < > 9.3 9.0 9.1 9.4 9.1  MG 2.4  --   --   --   --   --   --   PHOS 5.6*  --   --   --   --   --   --    < > = values in this interval not displayed.     Liver Function Tests: Recent Labs  Lab 02-16-2021 0348 02/13/21 0400 02/16/21 0341  AST 74* 66* 69*  ALT 95* 82* 48*  ALKPHOS 232* 209* 253*  BILITOT 0.5 0.7 0.6  PROT 6.8 6.8 6.7  ALBUMIN 2.5* 2.6* 2.4*     Antibiotics: Anti-infectives (From admission, onward)   Start     Dose/Rate Route Frequency Ordered Stop   02/16/2021 1200  ceFAZolin (ANCEF) IVPB 2g/100 mL premix        2 g 200 mL/hr over 30 Minutes Intravenous  Once 02/11/21 1052 2021-02-16 1333   01/07/21 0800  ceFEPIme (MAXIPIME) 2 g in sodium chloride 0.9 % 100 mL IVPB  Status:  Discontinued        2 g 200 mL/hr over 30 Minutes Intravenous Daily 01/06/21 1159 01/08/21 1257    01/05/21 1500  vancomycin (VANCOCIN) IVPB 1000 mg/200 mL premix  Status:  Discontinued        1,000 mg 200 mL/hr over 60 Minutes Intravenous Every 24 hours 01/05/21 1357 01/05/21 1414   01/05/21 1500  vancomycin (VANCOCIN) IVPB 1000 mg/200 mL premix  Status:  Discontinued        1,000 mg 200 mL/hr over 60 Minutes Intravenous Every 48 hours 01/05/21 1414 01/06/21 1146   01/05/21 1445  ceFEPIme (MAXIPIME) 2 g in sodium chloride 0.9 % 100 mL IVPB  Status:  Discontinued        2 g 200 mL/hr over 30 Minutes Intravenous Daily 01/05/21 1351 01/06/21 1146   01/03/21 1000  remdesivir 100 mg in sodium chloride 0.9 % 100 mL IVPB  Status:  Discontinued       "Followed by" Linked Group Details   100 mg 200 mL/hr over 30 Minutes Intravenous Daily 01/02/21 2257 01/04/21 1330   01/02/21 2300  remdesivir 200 mg in sodium chloride 0.9% 250 mL IVPB       "Followed by" Linked Group Details   200 mg 580 mL/hr over 30 Minutes Intravenous Once 01/02/21 2257 01/03/21 0147       DVT prophylaxis: Heparin  Code Status: Full code  Family Communication: Discussed with patient's sister on phone   Consultants:  IR  Procedures:  Peg tube placement    Objective   Vitals:   02/18/21 0445 02/18/21 0500 02/18/21 0800 02/18/21 1300  BP: (!) 153/61  (!) 163/58 (!) 144/49  Pulse: 71  71 (!) 59  Resp: (!) 30   (!) 30  Temp: 98 F (36.7 C)  98.4 F (36.9 C) 97.9 F (36.6 C)  TempSrc: Oral  Axillary Axillary  SpO2: 96%     Weight:  81.6 kg    Height:       No intake or output data in the 24 hours ending 02/18/21 1404  03/06 1901 - 03/08 0700 In: 60  Out: 700 [Urine:700]  Filed Weights   02/16/21 0400 02/17/21 0401 02/18/21 0500  Weight: 79.9 kg 78.9 kg 81.6 kg  Physical Examination:  General-appears in no acute distress Heart-S1-S2, regular, no murmur auscultated Lungs-clear to auscultation bilaterally, no wheezing or crackles auscultated Abdomen-soft, nontender, no  organomegaly Extremities-no edema in the lower extremities Neuro-somnolent but arousable, oriented to place and person only  Status is: Inpatient  Dispo: The patient is from: Home              Anticipated d/c is to: Skilled nursing facility              Anticipated d/c date is: 02/20/2021              Patient currently medically stable for discharge  Barrier to discharge-awaiting bed at skilled nursing facility  Pressure Injury Sacrum Mid red open skin (Active)     Location: Sacrum  Location Orientation: Mid  Staging:   Wound Description (Comments): red open skin  Present on Admission: Yes      Cardiac Enzymes: No results for input(s): CKTOTAL, CKMB, CKMBINDEX, TROPONINI in the last 168 hours. BNP (last 3 results) Recent Labs    10/19/20 0001 01/02/21 1947  BNP 321.9* 536.1*       [place]   [facility] If 7PM-7AM, please contact night-coverage at [email or web address], Office  [phone]   02/18/2021, 2:04 PM  LOS: 46 days

## 2021-02-19 DIAGNOSIS — N179 Acute kidney failure, unspecified: Secondary | ICD-10-CM | POA: Diagnosis not present

## 2021-02-19 DIAGNOSIS — G9341 Metabolic encephalopathy: Secondary | ICD-10-CM | POA: Diagnosis not present

## 2021-02-19 LAB — SARS CORONAVIRUS 2 (TAT 6-24 HRS): SARS Coronavirus 2: NEGATIVE

## 2021-02-19 LAB — GLUCOSE, CAPILLARY
Glucose-Capillary: 153 mg/dL — ABNORMAL HIGH (ref 70–99)
Glucose-Capillary: 163 mg/dL — ABNORMAL HIGH (ref 70–99)
Glucose-Capillary: 174 mg/dL — ABNORMAL HIGH (ref 70–99)

## 2021-02-19 MED ORDER — PANTOPRAZOLE SODIUM 40 MG PO PACK: 40.0000 mg | PACK | Freq: Every day | ORAL | Status: AC

## 2021-02-19 MED ORDER — INSULIN GLARGINE 100 UNIT/ML ~~LOC~~ SOLN: 5.0000 [IU] | Freq: Two times a day (BID) | SUBCUTANEOUS | Status: AC

## 2021-02-19 MED ORDER — ATORVASTATIN CALCIUM 80 MG PO TABS: 80.0000 mg | ORAL_TABLET | Freq: Every day | ORAL | Status: AC

## 2021-02-19 MED ORDER — LACTULOSE 10 GM/15ML PO SOLN: 20.0000 g | Freq: Two times a day (BID) | ORAL | Status: AC

## 2021-02-19 MED ORDER — INSULIN ASPART 100 UNIT/ML ~~LOC~~ SOLN: 0.0000 [IU] | SUBCUTANEOUS | Status: AC

## 2021-02-19 MED ORDER — NEPRO/CARBSTEADY PO LIQD: 1000.0000 mL | ORAL | Status: AC

## 2021-02-19 MED ORDER — FERROUS SULFATE 300 (60 FE) MG/5ML PO SYRP: 300.0000 mg | ORAL_SOLUTION | ORAL | Status: AC

## 2021-02-19 MED ORDER — HYDRALAZINE HCL 50 MG PO TABS
50.0000 mg | ORAL_TABLET | Freq: Three times a day (TID) | ORAL | Status: AC
Start: 2021-02-19 — End: ?

## 2021-02-19 MED ORDER — FREE WATER: 200.0000 mL | Status: AC

## 2021-02-19 MED ORDER — CARVEDILOL 6.25 MG PO TABS: 6.2500 mg | ORAL_TABLET | Freq: Two times a day (BID) | ORAL | Status: AC

## 2021-02-19 MED ORDER — AMLODIPINE BESYLATE 10 MG PO TABS: 10.0000 mg | ORAL_TABLET | Freq: Every day | ORAL | Status: AC

## 2021-02-19 NOTE — Discharge Instructions (Signed)

## 2021-02-19 NOTE — Plan of Care (Signed)
  Problem: Education: Goal: Knowledge of General Education information will improve Description: Including pain rating scale, medication(s)/side effects and non-pharmacologic comfort measures 02/19/2021 1251 by Zadie Rhine, RN Outcome: Adequate for Discharge 02/19/2021 1251 by Zadie Rhine, RN Outcome: Progressing   Problem: Clinical Measurements: Goal: Ability to maintain clinical measurements within normal limits will improve 02/19/2021 1251 by Zadie Rhine, RN Outcome: Adequate for Discharge 02/19/2021 1251 by Zadie Rhine, RN Outcome: Progressing Goal: Will remain free from infection Outcome: Adequate for Discharge Goal: Diagnostic test results will improve Outcome: Adequate for Discharge Goal: Respiratory complications will improve Outcome: Adequate for Discharge Goal: Cardiovascular complication will be avoided Outcome: Adequate for Discharge   Problem: Clinical Measurements: Goal: Will remain free from infection Outcome: Adequate for Discharge   Problem: Clinical Measurements: Goal: Ability to maintain clinical measurements within normal limits will improve 02/19/2021 1251 by Zadie Rhine, RN Outcome: Adequate for Discharge 02/19/2021 1251 by Zadie Rhine, RN Outcome: Progressing Goal: Will remain free from infection Outcome: Adequate for Discharge Goal: Diagnostic test results will improve Outcome: Adequate for Discharge Goal: Respiratory complications will improve Outcome: Adequate for Discharge Goal: Cardiovascular complication will be avoided Outcome: Adequate for Discharge

## 2021-02-19 NOTE — Discharge Summary (Signed)
Physician Discharge Summary  Desiree Pollard AGT:[id number] DOB: [birth year]  PCP: [name], FNP  Admitted from: SNF Discharged to: SNF  Admit date: 01/02/2021 Discharge date: 02/19/2021  Recommendations for Outpatient Follow-up:    Follow-up Information    MD at SNF. Schedule an appointment as soon as possible for a visit.   Why: To be seen in 2 to 3 days.  May consider repeat labs (CBC & CMP) depending on how aggressive family/healthcare power of attorney wishes to be in her care.  Recommend palliative care consultation and follow-up at SNF.       [name], FNP. Schedule an appointment as soon as possible for a visit.   Specialty: Family Medicine Why: As needed. Contact information: [place] [address] [place]: N/A    Equipment/Devices: TBD at SNF.     Discharge Condition: Stable but guarded and poor overall prognosis.   Code Status: DNR Diet recommendation: NPO.  Patient is on tube feeds.    Discharge Diagnoses:  Principal Problem:   Acute metabolic encephalopathy Active Problems:   Insulin dependent type 2 diabetes mellitus (HCC)   Hypertension   History of stroke   CKD (chronic kidney disease), stage IV ([place])   COVID-19 virus infection   Hypernatremia   Brief Summary: 75 year old female with medical history of CVA with left hemiparesis, expressive aphasia, dementia, CKD stage IV, complete heart block status post pacemaker placement, was admitted from skilled nursing facility with hypernatremia, COVID-19 infection encephalopathy.  Hospital course was complicated by acute kidney injury and dysphagia.  Nephrology consulted and deemed that she was not an appropriate long-term dialysis candidate.  Patient is now status post PEG tube placement and ongoing tube feeds.  As per discussion with TOC team today, patient's sister is patient's healthcare power of attorney and makes medical decisions on her  behalf.  Assessment and plan:    1. Acute metabolic encephalopathy: Likely multifactorial due to progressive renal failure and other complex medical problems complicating underlying dementia and advanced age. TSH, B12, EEG, CT head, MRI brain were negative for acute abnormality. As per RN report today, baseline waxing and waning mental status changes.  Currently alert and oriented x2. 2. Hypothermia-unclear etiology, resolved.  Briefly treated with a bear hugger earlier on in admission.  No signs or symptoms of sepsis. 3. Oropharyngeal dysphagia-NG tube was placed twice which patient pulled out.  NG feeding tube was placed for third time. Patient sister had requested PEG tube placement, palliative care was consulted however patient sister does not want anymore palliative care discussion.  PEG tube placed on 2021/02/15.  As per RN report today, patient tolerating PEG tube feeds.  Strict aspiration precautions. 4. Nonoliguric acute kidney injury on CKD stage IV-patient has progressive azotemia, possible uremia.  BUN has stabilized after stopping Lasix.  On 02/18/2021: BUN 102 and creatinine 2.57, stable compared to the day prior.  Renal ultrasound showed no acute finding.  Patient not hemodialysis candidate per nephrology.  She was started on Lasix 60 mg IV twice daily for pulmonary edema. Prior Hospitalist MD called and discussed with Nephrology, and Lasix was held. Nephrologist discussed with patient's sister, patient is not a hemodialysis candidate.  Will not resume Lasix at discharge. 5. Hypernatremia.  Improved after Free water changed to 200 cc every 4 hours.    Resolved. 6. Pancytopenia-patient had pancytopenia with leukopenia and thrombocytopenia.  FOBT  was negative.  Anemia panel was consistent with anemia of chronic disease.  Likely from malnutrition.  Hemoglobin is stable at 8.0.  Thrombocytopenia stable.  Leukopenia resolved. 7. Anasarca-secondary to hypoalbuminemia-nephrology does not recommend  giving albumin.  Lasix on hold due to worsening renal function as above. 8. COVID-19 infection-she tested positive on 01/02/2021.  Received remdesivir x3 doses.  No indication for airborne isolation.  Covid testing 3/8: Negative. 9. Hypertension-continue Coreg 6.25 mg p.o. twice daily, hydralazine 50 mg 3 times daily and amlodipine. 10. Acute on chronic diastolic CHF-peripherally volume overloaded but no clinical dyspnea or pulmonary edema.  Lasix discontinued due to worsening AKI. 11. Diabetes mellitus type 2-continue Lantus, sliding scale insulin NovoLog.  CBG well controlled.  Continue monitoring CBG every 4 hours. 12. History of CVA-patient has history of CVA in October 2021 with residual left hemiparesis and expressive aphasia.  CT head and MRI brain showed no acute finding. 64. Transaminitis-patient has mild transaminitis, which is slowly improving.  Right upper quadrant ultrasound was unremarkable.   14. Goals of care-prior hospitalist MD discussed with patient's sister Ms. Cheek at bedside.  Discussed in detail regarding patient's condition and worsening azotemia.  Also discussed poor prognosis considering persistent lethargy, encephalopathy.  Patient is s/p PEG tube placement and sister is hopeful that she might improve after she receives nutrition. The MD discussed in detail need for intubation and mechanical ventilation in case patient develops acute respiratory failure, also discussed CPR in the event of cardiac arrest.  Also discussed the futility of these measures considering patient's worsening clinical condition.  Patient sister agrees for DNR.  Also patient sister had questions on why she is not a hemodialysis candidate and the nephrologist has addressed these concerns with her.    Consultations:  Interventional radiology  Nephrology  Palliative care medicine  Procedures:  PEG tube placement   Discharge Instructions  Discharge Instructions    (HEART FAILURE PATIENTS) Call  MD:  Anytime you have any of the following symptoms: 1) 3 pound weight gain in 24 hours or 5 pounds in 1 week 2) shortness of breath, with or without a dry hacking cough 3) swelling in the hands, feet or stomach 4) if you have to sleep on extra pillows at night in order to breathe.   Complete by: As directed    Call MD for:   Complete by: As directed    Mental status changes or worsening mental status.   Call MD for:  difficulty breathing, headache or visual disturbances   Complete by: As directed    Call MD for:  extreme fatigue   Complete by: As directed    Call MD for:  persistant dizziness or light-headedness   Complete by: As directed    Call MD for:  persistant nausea and vomiting   Complete by: As directed    Call MD for:  severe uncontrolled pain   Complete by: As directed    Call MD for:  temperature >100.4   Complete by: As directed    Increase activity slowly   Complete by: As directed    No wound care   Complete by: As directed    Other Restrictions   Complete by: As directed    NPO.  Patient is on tube feeds.       Medication List    STOP taking these medications   clopidogrel 75 MG tablet Commonly known as: PLAVIX   ferrous sulfate 325 (65 FE) MG tablet Replaced by: ferrous sulfate 300 (  60 Fe) MG/5ML syrup   furosemide 40 MG tablet Commonly known as: LASIX   insulin lispro 100 UNIT/ML injection Commonly known as: HUMALOG   loperamide 2 MG tablet Commonly known as: Imodium A-D   pantoprazole 40 MG tablet Commonly known as: PROTONIX Replaced by: pantoprazole sodium 40 mg/20 mL Pack     TAKE these medications   Accu-Chek FastClix Lancets Misc 1 each by Other route as directed. To test blood glucose daily.   amLODipine 10 MG tablet Commonly known as: NORVASC Place 1 tablet (10 mg total) into feeding tube daily.   atorvastatin 80 MG tablet Commonly known as: LIPITOR Place 1 tablet (80 mg total) into feeding tube daily. What changed: how to take  this   carvedilol 6.25 MG tablet Commonly known as: COREG Place 1 tablet (6.25 mg total) into feeding tube 2 (two) times daily with a meal. What changed:   medication strength  how much to take  how to take this   dorzolamide-timolol 22.3-6.8 MG/ML ophthalmic solution Commonly known as: COSOPT INSTILL 1 DROP INTO BOTH EYES TWICE A DAY   feeding supplement (NEPRO CARB STEADY) Liqd Place 1,000 mLs into feeding tube continuous. 1,000 mL, Per Tube, at 50 mL/hr, Continuous   ferrous sulfate 300 (60 Fe) MG/5ML syrup Place 5 mLs (300 mg total) into feeding tube every other day. Replaces: ferrous sulfate 325 (65 FE) MG tablet   Fifty50 Glucose Meter 2.0 w/Device Kit 1 each by Other route See admin instructions. Use to check blood sugars   Fifty50 Pen Needles 32G X 4 MM Misc Generic drug: Insulin Pen Needle 1 each by Other route See admin instructions. Use as instructed.   free water Soln Place 200 mLs into feeding tube every 4 (four) hours.   hydrALAZINE 50 MG tablet Commonly known as: APRESOLINE Place 1 tablet (50 mg total) into feeding tube 3 (three) times daily. What changed:   medication strength  how much to take  how to take this  when to take this  additional instructions   insulin aspart 100 UNIT/ML injection Commonly known as: novoLOG Inject 0-9 Units into the skin every 4 (four) hours. CBG < 70: Implement Hypoglycemia protocol. CBG 70 - 120: 0 units CBG 121 - 150: 1 unit CBG 151 - 200: 2 units CBG 201 - 250: 3 units CBG 251 - 300: 5 units CBG 301 - 350: 7 units CBG 351 - 400: 9 units CBG > 400: call MD.   insulin glargine 100 UNIT/ML injection Commonly known as: LANTUS Inject 0.05 mLs (5 Units total) into the skin 2 (two) times daily.   lactulose 10 GM/15ML solution Commonly known as: CHRONULAC Place 30 mLs (20 g total) into feeding tube 2 (two) times daily.   pantoprazole sodium 40 mg/20 mL Pack Commonly known as: PROTONIX Place 20 mLs (40 mg  total) into feeding tube daily. Replaces: pantoprazole 40 MG tablet      Allergies  Allergen Reactions  . Amlodipine Nausea And Vomiting    Pt refuses to take due to severe n/v that began after starting amlodipine and stopped after discontinuation.      Procedures/Studies: CT ABDOMEN WO CONTRAST  Result Date: 02/06/2021 CLINICAL DATA:  Inpatient. Assess anatomy for possible gastrostomy tube placement. Chronic kidney disease. EXAM: CT ABDOMEN WITHOUT CONTRAST TECHNIQUE: Multidetector CT imaging of the abdomen was performed following the standard protocol without IV contrast. COMPARISON:  01/26/2021 abdominal radiograph. 12/01/2014 CT abdomen/pelvis. FINDINGS: Lower chest: Small to moderate right and small left  dependent pleural effusions with mild to moderate passive atelectasis at the dependent lung bases bilaterally. Cardiomegaly. ICD lead seen in the right atrium and terminating in the interventricular septum. Hepatobiliary: Normal liver with no liver mass. Normal gallbladder with no radiopaque cholelithiasis. No biliary ductal dilatation. Pancreas: Normal, with no mass or duct dilation. Spleen: Normal size. No mass. Adrenals/Urinary Tract: Normal adrenals. Nonobstructing 2 mm interpolar right renal and 1 mm upper polar left renal stones. No hydronephrosis. Simple bilateral renal cysts, largest 4.0 cm in the upper left kidney. Stomach/Bowel: Weighted enteric tube terminates in gastric antrum. Stomach is collapsed and grossly normal. Normal caliber small and large bowel loops. Moderate right colonic diverticulosis with no large bowel wall thickening or significant pericolonic fat stranding. Vascular/Lymphatic: Atherosclerotic nonaneurysmal abdominal aorta. No pathologically enlarged lymph nodes in the abdomen. Other: No pneumoperitoneum, ascites or focal fluid collection. Extensive anasarca. Musculoskeletal: No aggressive appearing focal osseous lesions. Mild thoracolumbar spondylosis. IMPRESSION:  1. Well-positioned enteric tube with tip in the gastric antrum. Stomach is collapsed and grossly normal. 2. Extensive anasarca. 3. Small to moderate right and small left dependent pleural effusions with bibasilar passive atelectasis. 4. Cardiomegaly. 5. Nonobstructing bilateral nephrolithiasis. 6. Moderate right colonic diverticulosis. 7. Aortic Atherosclerosis (ICD10-I70.0). Electronically Signed   By: Ilona Sorrel M.D.   On: 02/06/2021 16:47   DG Abd 1 View  Result Date: 01/26/2021 CLINICAL DATA:  Feeding tube placement. EXAM: ABDOMEN - 1 VIEW COMPARISON:  Abdominal x-ray from same day at [facility] hours. FINDINGS: Tip of the feeding tube is in the distal stomach near the pylorus. The bowel gas pattern is normal. IMPRESSION: 1. Feeding tube tip in the distal stomach. Electronically Signed   By: Titus Dubin M.D.   On: 01/26/2021 12:11   DG Abd 1 View  Result Date: 01/26/2021 CLINICAL DATA:  Feeding tube placement. EXAM: ABDOMEN - 1 VIEW COMPARISON:  Earlier film, same date. FINDINGS: The tip of the feeding tube is not covered on this exam. The tube is coursing down the esophagus and is at least in the body region of the stomach. A lower film OB necessary to establish this exact position. IMPRESSION: The tip of the feeding tube is not included on this exam. A lower abdominal film will be necessary. Electronically Signed   By: Marijo Sanes M.D.   On: 01/26/2021 09:02   DG Abd 1 View  Result Date: 01/26/2021 CLINICAL DATA:  75 year old female feeding tube placement. EXAM: ABDOMEN - 1 VIEW COMPARISON:  To 1122 and earlier. FINDINGS: Portable AP semi upright view at 0509 hours. Enteric feeding tube with guidewire in place terminates in the left lower abdomen compatible with placement in the stomach, gastric ptosis. Negative visible bowel gas pattern. Stable cardiomegaly continued confluent retrocardiac opacity, mild veiling lung base opacity. No acute osseous abnormality identified. Stable chest wall  surgical clips. IMPRESSION: Enteric feeding tube tip placed into the stomach. Gastric ptosis suspected. Advancement of roughly 9 cm would be necessary to allow for post pyloric transit. Electronically Signed   By: Genevie Ann M.D.   On: 01/26/2021 05:54   DG Abd 1 View  Result Date: 01/24/2021 CLINICAL DATA:  Feeding tube placement EXAM: ABDOMEN - 1 VIEW COMPARISON:  January 24, 2021 study obtained earlier in the day FINDINGS: Feeding tube tip is in the distal esophagus with the tip directed toward the gastroesophageal junction. There is ill-defined airspace opacity in the left lower lobe region. Lungs elsewhere are clear. Heart is mildly enlarged with pulmonary vascular normal.  Pacemaker leads are attached the right atrium and right ventricle. There is aortic atherosclerosis. There are surgical clips in the left lower chest region. IMPRESSION: Feeding tube tip in distal esophagus with tip directed toward gastroesophageal junction. Persistent opacity left lower lobe, unchanged from earlier in the day. Stable cardiac prominence. Pacemaker leads attached to right atrium and right ventricle. Aortic Atherosclerosis (ICD10-I70.0). These results will be called to the ordering clinician or representative by the Radiologist Assistant, and communication documented in the PACS or [employer]. Electronically Signed   By: Lowella Grip III M.D.   On: 01/24/2021 11:50   DG Abd 1 View  Result Date: 01/21/2021 CLINICAL DATA:  NG tube placement EXAM: ABDOMEN - 1 VIEW COMPARISON:  01/16/2021 FINDINGS: Feeding tube in place with the tip in the proximal stomach. IMPRESSION: Feeding tube tip in the proximal stomach. Electronically Signed   By: Rolm Baptise M.D.   On: 01/21/2021 03:40   CT HUMERUS RIGHT WO CONTRAST  Result Date: 02/02/2021 CLINICAL DATA:  Diabetic patient with right arm swelling and pain. Although the exam was ordered with contrast, contrast could not be administered due to renal insufficiency. EXAM: CT  OF THE RIGHT FOREARM WITHOUT CONTRAST; CT OF THE RIGHT HUMERUS WITHOUT CONTRAST TECHNIQUE: Multidetector CT imaging was performed according to the standard protocol. Multiplanar CT image reconstructions were also generated. COMPARISON:  None. FINDINGS: Bones/Joint/Cartilage No acute bony or joint abnormality is seen. No bony destructive change or periosteal reaction. No gas within bone. No joint effusion. Ligaments Suboptimally assessed by CT. Muscles and Tendons No intramuscular fluid collection. No gas within muscle or tracking along fascial planes. No fatty atrophy. No mass. Soft tissues Diffuse subcutaneous edema is present throughout the arm. No focal fluid collection. Subcutaneous edema is also seen in the right abdominal and chest wall. There is partial visualization of a small appearing right pleural effusion. IMPRESSION: Diffuse subcutaneous edema in the arm and right body wall is compatible with cellulitis, lymphedema or volume overload/anasarca. Negative for CT evidence of osteomyelitis, myositis or septic joint. Partial visualization of a small appearing right pleural effusion. Electronically Signed   By: Inge Rise M.D.   On: 02/02/2021 12:55   IR GASTROSTOMY TUBE MOD SED  Result Date: 03/03/21 INDICATION: 75 year old female with history of stroke and dysphagia. EXAM: PERC PLACEMENT GASTROSTOMY MEDICATIONS: Ancef 2 gm IV; Antibiotics were administered within 1 hour of the procedure. ANESTHESIA/SEDATION: Versed 0 mg IV; Fentanyl 0 mcg IV Moderate Sedation Time:  0 The patient was continuously monitored during the procedure by the interventional radiology nurse under my direct supervision. CONTRAST:  34m OMNIPAQUE IOHEXOL 300 MG/ML SOLN - administered into the gastric lumen. FLUOROSCOPY TIME:  Fluoroscopy Time: 0 minutes 18 seconds (3 mGy). COMPLICATIONS: None immediate. PROCEDURE: Informed written consent was obtained from the patient after a thorough discussion of the procedural risks,  benefits and alternatives. All questions were addressed. Maximal Sterile Barrier Technique was utilized including caps, mask, sterile gowns, sterile gloves, sterile drape, hand hygiene and skin antiseptic. A timeout was performed prior to the initiation of the procedure. The patient was placed on the procedure table in the supine position. Pre-procedure abdominal film confirmed visualization of the transverse colon. The stomach was insufflated with air via the indwelling nasogastric tube. Under fluoroscopy, a puncture site was selected and local analgesia achieved with 1% lidocaine infiltrated subcutaneously. Under fluoroscopic guidance, a gastropexy needle was passed into the stomach and the T-bar suture was released. Entry into the stomach was confirmed with fluoroscopy,  aspiration of air, and injection of contrast material. This was repeated with an additional gastropexy suture (for a total of 2 fasteners). At the center of these gastropexy sutures, a dermatotomy was performed. An 18 gauge needle was passed into the stomach at the site of this dermatotomy, and position within the gastric lumen again confirmed under fluoroscopy using aspiration of air and contrast injection. An Amplatz guidewire was passed through this needle and intraluminal placement within the stomach was confirmed by fluoroscopy. The needle was removed. Over the guidewire, the percutaneous tract was dilated using a 10 mm non-compliant balloon. The balloon was deflated, then pushed into the gastric lumen followed in concert by the 20 Fr gastrostomy tube. The retention balloon of the percutaneous gastrostomy tube was inflated with 20 mL of sterile water. The tube was withdrawn until the retention balloon was at the edge of the gastric lumen. The external bumper was brought to the abdominal wall. Contrast was injected through the gastrostomy tube, confirming intraluminal positioning. The patient tolerated the procedure well without any immediate  post-procedural complications. IMPRESSION: Technically successful placement of 20 Fr gastrostomy tube with absorbable gastropexy sutures. Ruthann Cancer, MD [facility] Electronically Signed   By: Ruthann Cancer MD   On: 2021-02-15 17:08   CT FOREARM RIGHT WO CONTRAST  Result Date: 02/02/2021 CLINICAL DATA:  Diabetic patient with right arm swelling and pain. Although the exam was ordered with contrast, contrast could not be administered due to renal insufficiency. EXAM: CT OF THE RIGHT FOREARM WITHOUT CONTRAST; CT OF THE RIGHT HUMERUS WITHOUT CONTRAST TECHNIQUE: Multidetector CT imaging was performed according to the standard protocol. Multiplanar CT image reconstructions were also generated. COMPARISON:  None. FINDINGS: Bones/Joint/Cartilage No acute bony or joint abnormality is seen. No bony destructive change or periosteal reaction. No gas within bone. No joint effusion. Ligaments Suboptimally assessed by CT. Muscles and Tendons No intramuscular fluid collection. No gas within muscle or tracking along fascial planes. No fatty atrophy. No mass. Soft tissues Diffuse subcutaneous edema is present throughout the arm. No focal fluid collection. Subcutaneous edema is also seen in the right abdominal and chest wall. There is partial visualization of a small appearing right pleural effusion. IMPRESSION: Diffuse subcutaneous edema in the arm and right body wall is compatible with cellulitis, lymphedema or volume overload/anasarca. Negative for CT evidence of osteomyelitis, myositis or septic joint. Partial visualization of a small appearing right pleural effusion. Electronically Signed   By: Inge Rise M.D.   On: 02/02/2021 12:55   DG CHEST PORT 1 VIEW  Result Date: February 15, 2021 CLINICAL DATA:  Shortness of breath. EXAM: PORTABLE CHEST 1 VIEW COMPARISON:  02/11/2021. FINDINGS: Feeding tube noted with tip below left hemidiaphragm. Cardiac pacer stable  position. Cardiomegaly with diffuse bilateral interstitial prominence and small pleural effusions again noted. Findings most consistent with CHF. Pneumonitis cannot be excluded. No interim change. No pneumothorax. Surgical clips left chest. IMPRESSION: Cardiac pacer in stable position. Cardiomegaly with diffuse bilateral interstitial prominence and small pleural effusions again noted. Findings most consistent with CHF. Similar findings noted on prior exam. Electronically Signed   By: [place]   On: February 15, 2021 05:38   DG CHEST PORT 1 VIEW  Result Date: 02/11/2021 CLINICAL DATA:  Shortness of breath, history breast cancer, stroke, hypertension, diabetes mellitus EXAM: PORTABLE CHEST 1 VIEW COMPARISON:  Portable exam 0459 hours compared to 02/09/2021 FINDINGS: Feeding tube extends into abdomen. RIGHT subclavian pacemaker with leads projecting over RIGHT atrium and RIGHT ventricle.  Enlargement of cardiac silhouette with pulmonary vascular congestion. BILATERAL pulmonary infiltrates consistent with pulmonary edema. Bibasilar pleural effusions, small. No pneumothorax. Atherosclerotic calcification at aortic arch. Surgical clips at LEFT axilla. IMPRESSION: Persistent pulmonary edema and CHF. Aortic Atherosclerosis (ICD10-I70.0). Electronically Signed   By: Lavonia Dana M.D.   On: 02/11/2021 08:20   DG CHEST PORT 1 VIEW  Result Date: 02/09/2021 CLINICAL DATA:  The hip knee. EXAM: PORTABLE CHEST 1 VIEW COMPARISON:  January 05, 2021 FINDINGS: Feeding catheter transverses the thorax. Dual lead cardiac pacemaker in stable position. Enlarged cardiac silhouette. Calcific atherosclerotic disease of the aorta. Bilateral pleural effusions.  Mild interstitial pulmonary edema. Chest wall postsurgical changes. IMPRESSION: 1. Enlarged cardiac silhouette with mild interstitial pulmonary edema and bilateral pleural effusions. Electronically Signed   By: Fidela Salisbury M.D.   On: 02/09/2021 18:30   DG Abd Portable  1V  Result Date: 01/24/2021 CLINICAL DATA:  Check feeding catheter placement EXAM: PORTABLE ABDOMEN - 1 VIEW COMPARISON:  01/24/2021 FINDINGS: Feeding catheter is noted extending into the stomach. The weighted portion is noted in the mid stomach directed towards the gastric antrum. No free air is noted. IMPRESSION: Weighted feeding catheter in the midportion of the stomach. Electronically Signed   By: Inez Catalina M.D.   On: 01/24/2021 12:48   DG Abd Portable 1V  Result Date: 01/24/2021 CLINICAL DATA:  Feeding tube placement EXAM: PORTABLE ABDOMEN - 1 VIEW COMPARISON:  January 21, 2011 FINDINGS: Feeding tube tip is at the level of the pharynx. No evident bowel dilatation or air-fluid level to suggest bowel obstruction. No free air. Consolidation left lower lobe noted. IMPRESSION: 1.  Feeding tube tip in pharynx. 2.  No bowel obstruction or free air evident. 3.  Consolidation left lung base. These results will be called to the ordering clinician or representative by the Radiologist Assistant, and communication documented in the PACS or [employer]. Electronically Signed   By: Lowella Grip III M.D.   On: 01/24/2021 10:30   DG Swallowing Func-Speech Pathology  Result Date: 01/29/2021 Objective Swallowing Evaluation: Type of Study: MBS-Modified Barium Swallow Study  Patient Details Name: Washington MRN: [id number] Date of Birth: 08-13-1946 Today's Date: 01/29/2021 Time: SLP Start Time (ACUTE ONLY): 3419 -SLP Stop Time (ACUTE ONLY): 0905 SLP Time Calculation (min) (ACUTE ONLY): 30 min Past Medical History: Past Medical History: Diagnosis Date . Breast cancer ([place])   2017 . CVA (cerebral vascular accident) ([place]) 11/2018 . Dehydration 07/2020 . Diabetes mellitus without complication ([place])  . Diabetic retinopathy ([place])   PDR OU . Frequent diarrhea 07/2020 . Hyperkalemia 07/2020 . Hypertensive retinopathy   OU . Stroke [facility])  Past Surgical History: Past Surgical History: Procedure Laterality Date .  CATARACT EXTRACTION Right  . MASTECTOMY Bilateral   "2017" Left Mastectomy; "2019" Right Mastectomy . 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  Imaging of brain previously has shown Stable appearance of right frontal encephalomalacia and bilateral  lacunar type infarcts in the basal ganglia and cerebellar  hemispheres. . Expected evolution of encephalomalacia along the right  inferior cerebellum corresponding with a presumed subacute infarct  on comparison CT.  3. Chronic microvascular angiopathy and parenchymal volume loss.  CVAs have left pt with aphasia and left weakness per Admit note.  Most recent CXR concerning for potential infectious process.  Swallow eval ordered and pt  has not been able to consume po intake due to dysphagia and mentation. She has pulled out 2 Dobbhoff tubes in the past 24 to 48 hours (2/11-2/13). MD requesting SLE to determine if patient is cognitively more ready for PO's, due to patient with reported improved alertness, speech clarity.  Subjective: alert but fatigued Assessment / Plan / Recommendation CHL IP CLINICAL IMPRESSIONS 01/29/2021 Clinical Impression Pt presents with moderately severe oral and mild pharyngeal dysphagia.  Oral transiting difficulties c/b excessive oral holding, lingual rocking, delayed transiting, decreased bolus cohesion and oral retention.  Pt frequently spills boluses (and residuals post swallow) into pharynx after oral holding for approx 30 seconds to 2 minutes.   Self feeding  holding cup - *using straw* and verbal cues to swallow improved transiting but delay was still significant.  Her pharyngeal swallow is not severely impaired = with trace aspiration of secretions noted x1 and mild pharyngeal retention.  Feeding tube in place from nares to stomach and this along with deconditioning may have impaired epiglottic  deflection allowing retention which pt does not sense.   Pt did cough x1 during testing and she appeared with secretion aspiration *trace* but not barium.  Secretions also retained in pharynx even at completion of testing. Will follow up to determine when/if pt may be appropriate to initiate full meals.  Hopeful for continued improvement, however with current level of swallow function, she will be unable to support herself by po alone. SLP Visit Diagnosis Dysphagia, oropharyngeal phase (R13.12) Attention and concentration deficit following -- Frontal lobe and executive function deficit following -- Impact on safety and function Risk for inadequate nutrition/hydration;Mild aspiration risk;Moderate aspiration risk   CHL IP TREATMENT RECOMMENDATION 01/29/2021 Treatment Recommendations Therapy as outlined in treatment plan below   Prognosis 01/29/2021 Prognosis for Safe Diet Advancement Fair Barriers to Reach Goals Time post onset Barriers/Prognosis Comment -- CHL IP DIET RECOMMENDATION 01/29/2021 SLP Diet Recommendations Other (Comment) Liquid Administration via Straw;Spoon Medication Administration Via alternative means Compensations Slow rate;Small sips/bites;Other (Comment) Postural Changes --   CHL IP OTHER RECOMMENDATIONS 01/29/2021 Recommended Consults -- Oral Care Recommendations -- Other Recommendations Have oral suction available   CHL IP FOLLOW UP RECOMMENDATIONS 01/29/2021 Follow up Recommendations Skilled Nursing facility   [facility] IP FREQUENCY AND DURATION 01/29/2021 Speech Therapy Frequency (ACUTE ONLY) min 2x/week Treatment Duration 1 week      CHL IP ORAL PHASE 01/29/2021 Oral Phase Impaired Oral - Pudding Teaspoon -- Oral - Pudding Cup -- Oral - Honey Teaspoon -- Oral - Honey Cup -- Oral - Nectar Teaspoon Holding of bolus;Decreased bolus cohesion;Premature spillage;Lingual/palatal residue Oral - Nectar Cup -- Oral - Nectar Straw Holding of bolus;Premature spillage;Decreased bolus cohesion;Lingual/palatal residue  Oral - Thin Teaspoon Lingual/palatal residue;Holding of bolus;Decreased bolus cohesion;Premature spillage Oral - Thin Cup -- Oral - Thin Straw Lingual/palatal residue;Decreased bolus cohesion;Premature spillage;Holding of bolus Oral - Puree Holding of bolus;Delayed oral transit;Premature spillage;Lingual/palatal residue Oral - Mech Soft -- Oral - Regular -- Oral - Multi-Consistency -- Oral - Pill -- Oral Phase - Comment Use of counting 1,2,3 to aid in swallow, verbal cues to swallow and self feeding were attempted with inconsistent success, A-P lingual rocking noted with pt containing bolus with oral tongue base prolonged  CHL IP PHARYNGEAL PHASE 01/29/2021 Pharyngeal Phase Impaired Pharyngeal- Pudding Teaspoon -- Pharyngeal -- Pharyngeal- Pudding Cup -- Pharyngeal -- Pharyngeal- Honey Teaspoon -- Pharyngeal -- Pharyngeal- Honey Cup -- Pharyngeal -- Pharyngeal- Nectar Teaspoon Pharyngeal residue - valleculae;Pharyngeal residue - pyriform Pharyngeal Material does not enter  airway Pharyngeal- Nectar Cup -- Pharyngeal -- Pharyngeal- Nectar Straw Pharyngeal residue - valleculae Pharyngeal Material does not enter airway Pharyngeal- Thin Teaspoon -- Pharyngeal -- Pharyngeal- Thin Cup -- Pharyngeal -- Pharyngeal- Thin Straw Pharyngeal residue - valleculae;Pharyngeal residue - pyriform Pharyngeal Material does not enter airway Pharyngeal- Puree Reduced epiglottic inversion;Reduced tongue base retraction;Pharyngeal residue - valleculae Pharyngeal -- Pharyngeal- Mechanical Soft -- Pharyngeal -- Pharyngeal- Regular -- Pharyngeal -- Pharyngeal- Multi-consistency -- Pharyngeal -- Pharyngeal- Pill -- Pharyngeal -- Pharyngeal Comment Barium adhered to feeding tube at times  CHL IP CERVICAL ESOPHAGEAL PHASE 01/29/2021 Cervical Esophageal Phase WFL Pudding Teaspoon -- Pudding Cup -- Honey Teaspoon -- Honey Cup -- Nectar Teaspoon -- Nectar Cup -- Nectar Straw -- Thin Teaspoon -- Thin Cup -- Thin Straw -- Puree -- Mechanical Soft --  Regular -- Multi-consistency -- Pill -- Cervical Esophageal Comment -- Kathleen Lime, MS [facility] SLP Acute Rehab Services Office [phone] Pager [phone] [name] 01/29/2021, 12:19 PM              VAS [place] UPPER EXTREMITY VENOUS DUPLEX  Result Date: 01/27/2021 UPPER VENOUS STUDY  Indications: Swelling Risk Factors: Immobility. Comparison Study: No previous exam Performing Technologist: Vonzell Schlatter RVT  Examination Guidelines: A complete evaluation includes B-mode imaging, spectral Doppler, color Doppler, and power Doppler as needed of all accessible portions of each vessel. Bilateral testing is considered an integral part of a complete examination. Limited examinations for reoccurring indications may be performed as noted.  Right Findings: +----------+------------+---------+-----------+----------+-------+ RIGHT     CompressiblePhasicitySpontaneousPropertiesSummary +----------+------------+---------+-----------+----------+-------+ IJV           Full       Yes       Yes                      +----------+------------+---------+-----------+----------+-------+ Subclavian    Full       Yes       Yes                      +----------+------------+---------+-----------+----------+-------+ Axillary      Full       Yes       Yes                      +----------+------------+---------+-----------+----------+-------+ Brachial      Full       Yes       Yes                      +----------+------------+---------+-----------+----------+-------+ Radial        Full                                          +----------+------------+---------+-----------+----------+-------+ Ulnar         Full                                          +----------+------------+---------+-----------+----------+-------+ Cephalic      Full                                          +----------+------------+---------+-----------+----------+-------+ Basilic       Full                                           +----------+------------+---------+-----------+----------+-------+  Summary:  Right: No evidence of deep vein thrombosis in the upper extremity. No evidence of superficial vein thrombosis in the upper extremity.  Left: No evidence of thrombosis in the subclavian.  *See table(s) above for measurements and observations.  Diagnosing physician: Ruta Hinds MD Electronically signed by Ruta Hinds MD on 01/27/2021 at 4:24:36 PM.    Final    US Abdomen Limited RUQ (LIVER/GB)  Result Date: 02/09/2021 CLINICAL DATA:  Abnormal LFTs EXAM: ULTRASOUND ABDOMEN LIMITED RIGHT UPPER QUADRANT COMPARISON:  None. FINDINGS: Gallbladder: No gallstones or wall thickening visualized. No sonographic Murphy sign noted by sonographer. Common bile duct: Diameter: 3 mm Liver: No focal lesion identified. Within normal limits in parenchymal echogenicity. Portal vein is patent on color Doppler imaging with normal direction of blood flow towards the liver. Other: Right-sided pleural effusion. IMPRESSION: 1. Unremarkable ultrasound of the right upper quadrant. 2. Right-sided pleural effusion. Electronically Signed   By: Dahlia Bailiff MD   On: 02/09/2021 15:45      Subjective: As per patient's nursing this morning, no acute issues and stable for DC to SNF.  As per RN, patient has chronic ongoing waxing and waning mental status changes, tolerating tube feeds.  Patient is alert and oriented to self and partly to place-hospital.  Denies pain.  Discharge Exam:  Vitals:   02/18/21 1721 02/18/21 2019 02/19/21 0431 02/19/21 0500  BP: (!) 142/60 (!) 148/63 (!) 151/67   Pulse: 60 (!) 59 60   Resp:  (!) 35 (!) 32   Temp:  (!) 97.5 F (36.4 C) 97.7 F (36.5 C)   TempSrc:  Oral Axillary   SpO2:  97% 100%   Weight:    79.8 kg  Height:        General: Elderly female, moderately built and poorly nourished, chronically ill looking lying comfortably propped up in bed without distress. Cardiovascular: S1 & S2 heard,  RRR, S1/S2 +. No murmurs, rubs, gallops or clicks.  Anasarca + +. Respiratory: Clear to auscultation anteriorly.  Diminished breath sounds in the bases.  No wheezing, rhonchi or crackles.  Rest of lung fields clear to auscultation. Abdominal:  Non distended, non tender & soft. No organomegaly or masses appreciated. Normal bowel sounds heard.  PEG tube dressing clean, dry and intact. CNS: Alert and oriented to self and partly to place.  No focal neurological deficits. Extremities: Anasarca with 2-3+ bilateral leg edema and also has bilateral upper extremity edema, more so of the right upper extremity compared to the left.  Distal pulses well felt and symmetrical.    The results of significant diagnostics from this hospitalization (including imaging, microbiology, ancillary and laboratory) are listed below for reference.     Microbiology: Recent Results (from the past 240 hour(s))  SARS CORONAVIRUS 2 (TAT 6-24 HRS) Nasopharyngeal Nasopharyngeal Swab     Status: None   Collection Time: 02/16/21  3:10 PM   Specimen: Nasopharyngeal Swab  Result Value Ref Range Status   SARS Coronavirus 2 NEGATIVE NEGATIVE Final    Comment: (NOTE) SARS-CoV-2 target nucleic acids are NOT DETECTED.  The SARS-CoV-2 RNA is generally detectable in upper and lower respiratory specimens during the acute phase of infection. Negative results do not preclude SARS-CoV-2 infection, do not rule out co-infections with other pathogens, and should not be used as the sole basis for treatment or other patient management decisions. Negative results must be combined with clinical observations, patient history, and epidemiological information. The expected result is Negative.  Fact Sheet for Patients: SugarRoll.be  Fact Sheet for Healthcare Providers: https://www.woods-mathews.com/  This test is not yet approved or cleared by the [place] FDA and  has been authorized for  detection and/or diagnosis of SARS-CoV-2 by FDA under an Emergency Use Authorization (EUA). This EUA will remain  in effect (meaning this test can be used) for the duration of the COVID-19 declaration under Se ction 564(b)(1) of the Act, 21 U.S.C. section 360bbb-3(b)(1), unless the authorization is terminated or revoked sooner.  Performed at [facility], [place] [address] [place], [place] [address]   SARS CORONAVIRUS 2 (TAT 6-24 HRS) Nasopharyngeal Nasopharyngeal Swab     Status: None   Collection Time: 02/18/21  5:00 PM   Specimen: Nasopharyngeal Swab  Result Value Ref Range Status   SARS Coronavirus 2 NEGATIVE NEGATIVE Final    Comment: (NOTE) SARS-CoV-2 target nucleic acids are NOT DETECTED.  The SARS-CoV-2 RNA is generally detectable in upper and lower respiratory specimens during the acute phase of infection. Negative results do not preclude SARS-CoV-2 infection, do not rule out co-infections with other pathogens, and should not be used as the sole basis for treatment or other patient management decisions. Negative results must be combined with clinical observations, patient history, and epidemiological information. The expected result is Negative.  Fact Sheet for Patients: SugarRoll.be  Fact Sheet for Healthcare Providers: https://www.woods-mathews.com/  This test is not yet approved or cleared by the [place] FDA and  has been authorized for detection and/or diagnosis of SARS-CoV-2 by FDA under an Emergency Use Authorization (EUA). This EUA will remain  in effect (meaning this test can be used) for the duration of the COVID-19 declaration under Se ction 564(b)(1) of the Act, 21 U.S.C. section 360bbb-3(b)(1), unless the authorization is terminated or revoked sooner.  Performed at [facility], [place] [address], [place], [place] [address]      Labs: CBC: Recent Labs  Lab 02/13/21 0400 02/16/21 0341  WBC 6.9  5.6  HGB 8.2* 8.0*  HCT 27.3* 26.8*  MCV 97.2 96.8  PLT 118* 129*    Basic Metabolic Panel: Recent Labs  Lab 02/14/21 0942 02/15/21 0359 02/16/21 0341 02/17/21 0843 02/18/21 0421  NA 143 141 143 148* 144  K 3.8 3.7 3.6 3.6 3.6  CL 103 103 106 107 107  CO2 26 32 28 27 28   GLUCOSE 160* 164* 165* 167* 128*  BUN 112* 114* 114* 101* 102*  CREATININE 2.98* 2.81* 2.55* 2.51* 2.57*  CALCIUM 9.3 9.0 9.1 9.4 9.1    Liver Function Tests: Recent Labs  Lab 02/13/21 0400 02/16/21 0341  AST 66* 69*  ALT 82* 48*  ALKPHOS 209* 253*  BILITOT 0.7 0.6  PROT 6.8 6.7  ALBUMIN 2.6* 2.4*    CBG: Recent Labs  Lab 02/18/21 1606 02/18/21 2013 02/19/21 0000 02/19/21 0434 02/19/21 0758  GLUCAP 118* 149* 174* 163* 153*    Urinalysis    Component Value Date/Time   COLORURINE YELLOW 01/02/2021 2053   APPEARANCEUR CLEAR 01/02/2021 2053   LABSPEC 1.014 01/02/2021 2053   PHURINE 5.0 01/02/2021 2053   GLUCOSEU 50 (A) 01/02/2021 2053   HGBUR NEGATIVE 01/02/2021 2053   BILIRUBINUR NEGATIVE 01/02/2021 2053   KETONESUR NEGATIVE 01/02/2021 2053   PROTEINUR >=300 (A) 01/02/2021 2053   NITRITE NEGATIVE 01/02/2021 2053   LEUKOCYTESUR NEGATIVE 01/02/2021 2053    I discussed in detail with patient's sister, healthcare power of attorney, updated care and answered all questions.  Advised her that patient is being discharged to SNF today  and she was agreeable.  Time coordinating discharge: 45 minutes  SIGNED:  Vernell Leep, MD, [place], [facility]. [facility]  To contact the attending provider between 7A-7P or the covering provider during after hours 7P-7A, please log into the web site [email or web address] and access using universal [place] password for that web site. If you do not have the password, please call the hospital operator.

## 2021-02-19 NOTE — Progress Notes (Signed)
SLP Cancellation Note  Patient Details Name: Yeily Link MRN: [id number] DOB: [birth year]   Cancelled treatment:        Attempted to see pt for ongoing dysphagia management.  At time of attempt transport had arrived for pt's discharge.  Recommend continuing ST for dysphagia as indicated at next level of care.    Celedonio Savage, MA, [place] Office: [phone] 02/19/2021, 11:32 AM

## 2021-02-19 NOTE — NC FL2 (Signed)
[place] LEVEL OF CARE SCREENING TOOL     IDENTIFICATION  Patient Name: Desiree Pollard Birthdate: [birth year] Sex: female Admission Date (Current Location): 01/02/2021  [facility] and [place] Number:  Herbalist and Address:  [facility],  [place], [place]      Provider Number: [id number]  Attending Physician Name and Address:  Modena Jansky, MD  Relative Name and Phone Number:  cheek,christine Sister   [phone] wash,katherine Sister   [phone]    Current Level of Care: Hospital Recommended Level of Care: [place] Prior Approval Number:    Date Approved/Denied:   PASRR Number: [id number]  Discharge Plan: SNF    Current Diagnoses: Patient Active Problem List   Diagnosis Date Noted  . COVID-19 virus infection 01/02/2021  . Hypernatremia 01/02/2021  . 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 CKD (chronic kidney disease), stage IV ([place]) 11/26/2020  . Acute metabolic encephalopathy 67/89/3810  . Generalized weakness 11/24/2020  . Acute lower UTI 11/24/2020  . Hypothermia 11/24/2020  . ARF (acute renal failure) ([place]) 10/19/2020  . Dizziness 10/19/2020  . Thrombocytopenia ([place]) 10/19/2020  . Chronic diarrhea 09/18/2020  . Loss of weight 09/18/2020  . Dysphagia 09/18/2020  . Insulin dependent type 2 diabetes mellitus ([place]) 06/16/2020  . Hemoglobin A1c less than 7.0% 06/16/2020  . Hyperglycemia 06/16/2020  . Hypertension 06/16/2020  . History of stroke 06/16/2020    Orientation RESPIRATION BLADDER Height & Weight     Self  Normal Incontinent Weight: 175 lb 14.8 oz (79.8 kg) Height:  5\' 7"  (170.2 cm)  BEHAVIORAL SYMPTOMS/MOOD NEUROLOGICAL BOWEL NUTRITION STATUS      Incontinent Feeding tube  AMBULATORY STATUS COMMUNICATION OF NEEDS Skin   Total Care Does not communicate PU Stage and Appropriate Care (Prn dressing change)                       Personal Care Assistance  Level of Assistance  Bathing,Feeding,Dressing Bathing Assistance: Maximum assistance Feeding assistance: Maximum assistance Dressing Assistance: Maximum assistance     Functional Limitations Info  Sight,Hearing,Speech Sight Info: Adequate Hearing Info: Adequate Speech Info: Impaired    SPECIAL CARE FACTORS FREQUENCY  PT (By licensed PT),OT (By licensed OT)     PT Frequency: Minimum 5x a week OT Frequency: Minimum 5x a week            Contractures Contractures Info: Not present    Additional Factors Info  Code Status,Allergies Code Status Info: DNR Allergies Info: Amlodipine   Insulin Sliding Scale Info: insulin aspart (novoLOG) injection 0-9 Units       Current Medications (02/19/2021):  This is the current hospital active medication list Current Facility-Administered Medications  Medication Dose Route Frequency Provider Last Rate Last Admin  . amLODipine (NORVASC) tablet 10 mg  10 mg Oral Daily Oswald Hillock, MD   10 mg at 02/18/21 1020  . atorvastatin (LIPITOR) tablet 80 mg  80 mg Per Tube Daily Dessa Phi, DO   80 mg at 02/18/21 1011  . carvedilol (COREG) tablet 6.25 mg  6.25 mg Per NG tube BID WC Hall, Carole N, DO   6.25 mg at 02/18/21 1714  . chlorhexidine (PERIDEX) 0.12 % solution 15 mL  15 mL Mouth Rinse BID Mariel Aloe, MD   15 mL at 02/18/21 2133  . dorzolamide-timolol (COSOPT) 22.3-6.8 MG/ML ophthalmic solution 1 drop  1 drop Both Eyes BID Etta Quill, DO  1 drop at 02/18/21 2135  . feeding supplement (NEPRO CARB STEADY) liquid 1,000 mL  1,000 mL Per Tube Continuous Raiford Noble Carl, DO 50 mL/hr at 02/17/21 0547 1,000 mL at 02/17/21 0547  . ferrous sulfate 300 (60 Fe) MG/5ML syrup 300 mg  300 mg Per Tube Orest Dikes, DO   300 mg at 02/17/21 1059  . free water 200 mL  200 mL Per Tube Q4H Oswald Hillock, MD   200 mL at 02/19/21 0510  . guaiFENesin (ROBITUSSIN) 100 MG/5ML solution 100 mg  5 mL Per Tube Q4H PRN Raiford Noble [place], DO   100  mg at 02/11/21 1194  . hydrALAZINE (APRESOLINE) injection 10-20 mg  10-20 mg Intravenous Q6H PRN Raiford Noble Latif, DO   20 mg at 02/16/21 1047  . hydrALAZINE (APRESOLINE) tablet 50 mg  50 mg Per Tube Q8H Hall, Carole N, DO   50 mg at 02/19/21 0509  . insulin aspart (novoLOG) injection 0-9 Units  0-9 Units Subcutaneous Q4H Mariel Aloe, MD   2 Units at 02/19/21 0507  . insulin glargine (LANTUS) injection 5 Units  5 Units Subcutaneous BID Kayleen Memos, DO   5 Units at 02/18/21 2133  . ipratropium (ATROVENT) nebulizer solution 0.5 mg  0.5 mg Nebulization Q6H PRN Oswald Hillock, MD      . lactulose (CHRONULAC) 10 GM/15ML solution 20 g  20 g Per Tube BID Wendee Beavers T, MD   20 g at 02/18/21 2133  . levalbuterol (XOPENEX) nebulizer solution 0.63 mg  0.63 mg Nebulization Q6H PRN Oswald Hillock, MD      . MEDLINE mouth rinse  15 mL Mouth Rinse q12n4p Mariel Aloe, MD   15 mL at 02/18/21 1721  . metoprolol tartrate (LOPRESSOR) injection 2.5 mg  2.5 mg Intravenous Q6H PRN Irene Pap N, DO   2.5 mg at 02/17/21 1736  . nystatin (MYCOSTATIN) 100000 UNIT/ML suspension 500,000 Units  5 mL Mouth/Throat QID Dessa Phi, DO   500,000 Units at 02/18/21 2133  . pantoprazole sodium (PROTONIX) 40 mg/20 mL oral suspension 40 mg  40 mg Per Tube Daily Dessa Phi, DO   40 mg at 02/18/21 1024     Discharge Medications: Please see discharge summary for a list of discharge medications.  Relevant Imaging Results:  Relevant Lab Results:   Additional Information SSN: [id number] LCSW

## 2021-02-19 NOTE — TOC Transition Note (Signed)
Transition of Care [facility]) - CM/SW Discharge Note   Patient Details  Name: Desiree Pollard MRN: [id number] Date of Birth: 1946/04/06  [facility] CM/SW Contact:  Shade Flood, LCSW Phone Number: 02/19/2021, 10:24 AM   Clinical Narrative:     Pt stable for dc today per MD. Damaris Schooner with pt's sister/HCPOA who confirms plan is for dc to [facility] today. Spoke with Admissions rep from [place] who confirms that they can take patient today. DC clinical sent electronically. [facility]AR will be arranged when Miquel Dunn confirms they are ready. RN will call report.  There are no other TOC needs for dc.  Final next level of care: Skilled Nursing Facility Barriers to Discharge: Barriers Resolved   Patient Goals and CMS Choice Patient states their goals for this hospitalization and ongoing recovery are:: get better CMS Medicare.gov Compare Post Acute Care list provided to:: Patient Represenative (must comment) Choice offered to / list presented to : Sibling  Discharge Placement              Patient chooses bed at: [facility] Patient to be transferred to facility by: EMS Name of family member notified: [name] (sister) Patient and family notified of of transfer: 02/19/21  Discharge Plan and Services                                     Social Determinants of Health ([place]) Interventions     Readmission Risk Interventions Readmission Risk Prevention Plan 02/18/2021 01/13/2021  Transportation Screening Complete Complete  PCP or Specialist Appt within 3-5 Days - Complete  Social Work Consult for [place] Planning/Counseling - Complete  Palliative Care Screening - Complete  Medication Review Press photographer) Complete Referral to Pharmacy  PCP or Specialist appointment within 3-5 days of discharge Complete -  [place] or Home Care Consult Complete -  SW Recovery Care/Counseling Consult Complete -  Palliative Care Screening Not Applicable -  [place]  Complete -

## 2021-02-19 NOTE — Plan of Care (Signed)
  Problem: Education: Goal: Knowledge of General Education information will improve Description: Including pain rating scale, medication(s)/side effects and non-pharmacologic comfort measures 02/19/2021 1251 by Zadie Rhine, RN Outcome: Adequate for Discharge 02/19/2021 1251 by Zadie Rhine, RN Outcome: Progressing   Problem: Clinical Measurements: Goal: Ability to maintain clinical measurements within normal limits will improve 02/19/2021 1251 by Zadie Rhine, RN Outcome: Adequate for Discharge 02/19/2021 1251 by Zadie Rhine, RN Outcome: Progressing Goal: Will remain free from infection Outcome: Adequate for Discharge Goal: Diagnostic test results will improve Outcome: Adequate for Discharge Goal: Respiratory complications will improve Outcome: Adequate for Discharge Goal: Cardiovascular complication will be avoided Outcome: Adequate for Discharge

## 2021-02-19 NOTE — Plan of Care (Incomplete)

## 2021-02-20 ENCOUNTER — Non-Acute Institutional Stay: Payer: Medicare HMO | Admitting: Adult Health Nurse Practitioner

## 2021-02-20 DIAGNOSIS — Z8673 Personal history of transient ischemic attack (TIA), and cerebral infarction without residual deficits: Secondary | ICD-10-CM

## 2021-02-20 DIAGNOSIS — F0150 Vascular dementia without behavioral disturbance: Secondary | ICD-10-CM

## 2021-02-20 DIAGNOSIS — R1310 Dysphagia, unspecified: Secondary | ICD-10-CM

## 2021-02-20 DIAGNOSIS — Z515 Encounter for palliative care: Secondary | ICD-10-CM

## 2021-02-20 NOTE — Progress Notes (Addendum)
[facility] Consult Note Telephone: [phone]  Fax: [phone]  PATIENT NAME: Desiree Pollard DOB: [birth year] MRN: [id number]  PRIMARY CARE PROVIDER:   [name], FNP  REFERRING PROVIDER: Roddie Mc, PA  RESPONSIBLE PARTY:   Trudi Ida, sister/HC POA [phone]  Chief complaint: Initial palliative visit for complex medical decision making    RECOMMENDATIONS and PLAN:  1.  Advanced care planning.  Patient is a DNR per facility staff.  Sister, Trudi Ida, is [facility] POA.  Will contact sister to discuss goals of care.  2.  History of CVA/vascular dementia.  Patient is total care.  Incontinent of bowel and bladder.  Left-sided weakness from prior stroke.  Dysphagia due to stroke and unable to take in oral nutrition, see below.  3.  Dysphagia.  Patient receives all nutrition and medications through PEG tube.  Continue nutrition as ordered and supportive care at facility  I spent 45 minutes providing this consultation, including time spent with patient, provider coordination, chart review including most recent labs and imaging, documentation. More than 50% of the time in this consultation was spent coordinating communication.   HISTORY OF PRESENT ILLNESS:  Desiree Weisheit is a 75 y.o. year old female with multiple medical problems including vascular dementia, history of CVA with aphasia and left-sided weakness, DM T2, HTN, CKD stage IV, pancytopenia, and [place], CHF. Palliative Care was asked to help address goals of care.  Patient was resident of [facility] until last CVA on 01/02/2021.  She is now resident at [facility].  Patient had initial CVA in April 2021.  She was able to go back home after this.  11/5 through 10/28/2020 she was hospitalized for another CVA with left-sided hemiparesis.  Patient was discharged to [facility] at this time.  Patient was able to walk with walker and had good appetite and could engage in  conversation.  Patient was hospitalized 1/20 through 02/19/2021 with Covid encephalopathy.  Hospital stay was complicated by AKI and dysphagia.  Patient not deemed candidate for long-term dialysis.  Patient had PEG tube placed in which she receives all nutrition and medications.  Patient is now total care and nonambulatory.  Patient unable to contribute to HPI/ROS secondary to aphasia and dementia.  Staff reports today that when she arrived at facility yesterday she was able to tell her name.  Yesterday she was not on supplemental oxygen and since then she has started having periods of apnea and was started on supplemental oxygen at 1.5 L.  Today patient is sleeping and lethargic.  She does rouse with voice and touch.  Provider at facility does state that she has been having periods of apnea and what also appears to be [employer] breathing.  CODE STATUS: DNR  PPS: 20% HOSPICE ELIGIBILITY/DIAGNOSIS: TBD  PHYSICAL EXAM:  HR 70 O2 94% on 1.5 L General: NAD, frail appearing ENMT: Moist mucous membranes Cardiovascular: regular rate and rhythm Pulmonary: clear ant fields Abdomen: soft, nontender, + bowel sounds; PEG tube in place with dry clean dressing Extremities: Trace edema and has TED hose in place, no joint deformities Skin: no rashes on exposed skin; has bandages to bilateral heels for preventive measures Neurological: Weakness; patient rouses to touch and voice.  She does not answer questions or engage with provider   Family History  Problem Relation Age of Onset  . Ovarian cancer Mother   . Heart disease Father   . Diabetes Sister   . Diabetes Brother   .  Colon cancer Neg Hx   . Esophageal cancer Neg Hx   . Pancreatic cancer Neg Hx   . Stomach cancer Neg Hx   . Liver disease Neg Hx       PAST MEDICAL HISTORY:  Past Medical History:  Diagnosis Date  . Breast cancer ([place])    2017  . CVA (cerebral vascular accident) ([place]) 11/2018  . Dehydration 07/2020  . Diabetes  mellitus without complication ([place])   . Diabetic retinopathy ([place])    PDR OU  . Frequent diarrhea 07/2020  . Hyperkalemia 07/2020  . Hypertensive retinopathy    OU  . Stroke [facility])     SOCIAL HX:  Social History   Tobacco Use  . Smoking status: Former Research scientist (life sciences)  . Smokeless tobacco: Never Used  Substance Use Topics  . Alcohol use: Not Currently    ALLERGIES:  Allergies  Allergen Reactions  . Amlodipine Nausea And Vomiting    Pt refuses to take due to severe n/v that began after starting amlodipine and stopped after discontinuation.     PERTINENT MEDICATIONS:  Outpatient Encounter Medications as of 77/09/2021  Medication Sig  . Accu-Chek FastClix Lancets MISC 1 each by Other route as directed. To test blood glucose daily.  Marland Kitchen amLODipine (NORVASC) 10 MG tablet Place 1 tablet (10 mg total) into feeding tube daily.  Marland Kitchen atorvastatin (LIPITOR) 80 MG tablet Place 1 tablet (80 mg total) into feeding tube daily.  . Blood Glucose Monitoring Suppl (FIFTY50 GLUCOSE METER 2.0) w/Device KIT 1 each by Other route See admin instructions. Use to check blood sugars  . carvedilol (COREG) 6.25 MG tablet Place 1 tablet (6.25 mg total) into feeding tube 2 (two) times daily with a meal.  . dorzolamide-timolol (COSOPT) 22.3-6.8 MG/ML ophthalmic solution INSTILL 1 DROP INTO BOTH EYES TWICE A DAY (Patient taking differently: Place 1 drop into both eyes 2 (two) times daily.)  . ferrous sulfate 300 (60 Fe) MG/5ML syrup Place 5 mLs (300 mg total) into feeding tube every other day.  . hydrALAZINE (APRESOLINE) 50 MG tablet Place 1 tablet (50 mg total) into feeding tube 3 (three) times daily.  . insulin aspart (NOVOLOG) 100 UNIT/ML injection Inject 0-9 Units into the skin every 4 (four) hours. CBG < 70: Implement Hypoglycemia protocol. CBG 70 - 120: 0 units CBG 121 - 150: 1 unit CBG 151 - 200: 2 units CBG 201 - 250: 3 units CBG 251 - 300: 5 units CBG 301 - 350: 7 units CBG 351 - 400: 9 units CBG > 400: call MD.   . insulin glargine (LANTUS) 100 UNIT/ML injection Inject 0.05 mLs (5 Units total) into the skin 2 (two) times daily.  . Insulin Pen Needle (FIFTY50 PEN NEEDLES) 32G X 4 MM MISC 1 each by Other route See admin instructions. Use as instructed.  . lactulose (CHRONULAC) 10 GM/15ML solution Place 30 mLs (20 g total) into feeding tube 2 (two) times daily.  . Nutritional Supplements (FEEDING SUPPLEMENT, NEPRO CARB STEADY,) LIQD Place 1,000 mLs into feeding tube continuous. 1,000 mL, Per Tube, at 50 mL/hr, Continuous  . pantoprazole sodium (PROTONIX) 40 mg/20 mL PACK Place 20 mLs (40 mg total) into feeding tube daily.  . Water For Irrigation, Sterile (FREE WATER) SOLN Place 200 mLs into feeding tube every 4 (four) hours.   No facility-administered encounter medications on file as of 77/09/2021.     Annalysia Willenbring Jenetta Downer, NP

## 2021-02-21 ENCOUNTER — Telehealth: Payer: Self-pay | Admitting: Adult Health Nurse Practitioner

## 2021-02-21 ENCOUNTER — Other Ambulatory Visit: Payer: Self-pay

## 2021-02-21 NOTE — Telephone Encounter (Signed)
Spoke with patient's sister to update on yesterday's visit.  Discussed hospice and she would like to proceed with therapy and states that feeding tube is supposed to be temporary for at least 8 weeks.  Will follow up in 2 weeks.  Encouraged to call with any questions or concerns [name] K. Olena Heckle NP

## 2021-03-14 NOTE — Progress Notes (Signed)
Tube feeding stopped at 0750.

## 2021-03-14 NOTE — Progress Notes (Addendum)
[facility]  PROGRESS NOTE  Desiree Pollard SAY:[id number] DOB: Oct 31, 1946 DOA: 01/02/2021 PCP: [name], FNP   Brief HPI:   75 year old female with history of CVA with left hemiparesis, expressive aphasia, dementia, CKD stage IV, complete heart block status post pacemaker placement who was admitted from skilled nursing facility with hyper natremia, COVID-19 infection encephalopathy.  MRI was negative.  Hospital course was complicated by acute kidney injury and dysphagia.  Nephrology did not feel that patient was a hemodialysis candidate.  NG tube was placed but patient pulled out twice and was replaced for 3rd time.  Plan for PEG tube placement today.  Patient sister is trying to obtain healthcare power of attorney status.  She has decided to get PEG tube.  Patient's renal function has been worsening in the hospital.  As above she is not a HD candidate.  Patient is on furosemide 60 mg IV twice daily.    Subjective   Patient seen and examined, continues to be lethargic.  Barely opens eyes to verbal stimuli.  Plan for PEG tube per IR today.   Assessment/Plan:     1. Acute metabolic encephalopathy-likely multifactorial from worsening renal function.  Patient's BUN is steadily rising, 113.  Concerning for uremia.  TSH, B12, EEG, CT head, MRI brain were negative for acute abnormality.  Patient is not a hemodialysis candidate due to multiple comorbidities as per nephrology. 2. Oropharyngeal dysphagia-NG tube was placed twice which patient pulled out.  Feeding tube has been placed for 3rd time.  She is on dysphagia one diet, not been eating due to the worsening mental status.  Patient sister has requested PEG tube placement, palliative care was consulted however patient sister does not want anymore palliative care discussion. 3. Nonoliguric acute kidney injury on CKD stage IV-patient has progressive azotemia, possible uremia.  BUN/creatinine is slightly trending up.  Today BUN is 113.   Renal ultrasound showed no finding.  Patient not hemodialysis candidate per nephrology.  Started on Lasix 60 mg IV twice daily.  Called and discussed with nephrology, Dr. Moshe Cipro, will hold Lasix at this time.  She will see patient in AM. 4. Pancytopenia-patient has pancytopenia with leukopenia and thrombocytopenia.  FOBT was negative.  Anemia panel was consistent with anemia of chronic disease.  Likely from malnutrition.  Hemoglobin is stable at 8.3. 5. Anasarca-secondary to hypoalbuminemia-nephrology does not recommend giving albumin.  Continue Lasix as above. 6. Hypothermia-patient had episode of hypothermia with temperature 94.1 2 days ago.  She was applied [employer], temperature has improved.  Now stable. 7. COVID-19 infection-she tested positive on 01/02/2021.  Received remdesivir x3 doses.  No indication for airborne isolation. 8. Hypertension-continue Coreg 6.25 mg p.o. twice daily, hydralazine 50 mg 3 times daily. 9. Acute on chronic diastolic CHF-patient is currently requiring 2 L/min of oxygen by nasal cannula.  Chest x-ray done 2 days ago still shows pulmonary edema.  Continue IV Lasix as above. 10. History of CVA-patient has history of CVA in October 2021 with residual left hemiparesis and expressive aphasia.  CT head and MRI brain showed no acute finding. 16. Transaminitis-patient has mild transaminitis, which is slowly improving.  Right upper quadrant ultrasound was unremarkable.  Hepatitis panel is pending. 12. Goals of care-discussed with patient's sister Ms. Cheek at bedside.  Discussed in detail regarding patient's condition and worsening azotemia.  Also discussed poor prognosis considering persistent lethargy, encephalopathy.  Patient is going for PEG tube placement today, and sister is hopeful that she might improve after  she receives nutrition.  I discussed in detail need for intubation and mechanical ventilation in case patient develops acute respiratory failure, also  discussed CPR in the event of cardiac arrest.  Also discussed the futility of these measures considering patient's worsening clinical condition.  Patient sister agrees for DNR.  Also patient sister had questions on why she is not a hemodialysis candidate.  I have called Dr. Moshe Cipro to update patient sister regarding questions on hemodialysis.     COVID-19 Labs  No results for input(s): DDIMER, FERRITIN, LDH, CRP in the last 72 hours.  Lab Results  Component Value Date   SARSCOV2NAA POSITIVE (A) 01/02/2021   [place] NEGATIVE 11/24/2020   [place] NEGATIVE 10/28/2020   [place] NEGATIVE 10/25/2020     Scheduled medications:   . atorvastatin  80 mg Per Tube Daily  . carvedilol  6.25 mg Per NG tube BID WC  . chlorhexidine  15 mL Mouth Rinse BID  . dorzolamide-timolol  1 drop Both Eyes BID  . ferrous sulfate  300 mg Per Tube QODAY  . free water  200 mL Per Tube QID  . furosemide  60 mg Intravenous BID  . hydrALAZINE  50 mg Per Tube Q8H  . insulin aspart  0-9 Units Subcutaneous Q4H  . insulin glargine  5 Units Subcutaneous BID  . lactulose  20 g Per Tube BID  . lipase/protease/amylase)  20,880 Units Per Tube Once  . mouth rinse  15 mL Mouth Rinse q12n4p  . nystatin  5 mL Mouth/Throat QID  . pantoprazole sodium  40 mg Per Tube Daily  . sodium bicarbonate  650 mg Per Tube Once         CBG: Recent Labs  Lab 03/10/2021 0002 2021-03-10 0047 March 10, 2021 0420 2021-03-10 0747 2021/03/10 1130  GLUCAP 64* 100* 110* 118* 97    SpO2: 100 % O2 Flow Rate (L/min): 2 L/min    CBC: Recent Labs  Lab 02/08/21 0336 02/09/21 0409 02/10/21 0344 02/11/21 0407 03-10-2021 0348  WBC 4.8 5.2 5.5 7.3 5.4  NEUTROABS 3.3 3.6 3.8 5.6 3.8  HGB 8.1* 8.3* 8.2* 8.7* 8.3*  HCT 25.7* 26.8* 25.5* 27.8* 27.0*  MCV 92.1 94.0 91.1 93.6 97.1  PLT 121* 110* 98* 122* 111*    Basic Metabolic Panel: Recent Labs  Lab 02/08/21 0336 02/09/21 0409 02/10/21 0344 02/11/21 0407 March 10, 2021 0348   NA 136 137 135 138 137  K 3.9 4.0 3.9 4.0 3.8  CL 100 99 99 99 100  CO2 26 28 24 25 26   GLUCOSE 135* 158* 151* 136* 117*  BUN 84* 105* 105* 103* 113*  CREATININE 2.82* 2.87* 2.85* 2.82* 2.93*  CALCIUM 9.0 9.2 9.1 9.4 9.1  MG 2.2 2.4 2.3 2.5* 2.4  PHOS 4.3 4.7* 4.4 3.6 5.6*     Liver Function Tests: Recent Labs  Lab 02/08/21 0336 02/09/21 0409 02/10/21 0344 02/11/21 0407 2021-03-10 0348  AST 77* 89* 78* 69* 74*  ALT 93* 110* 103* 103* 95*  ALKPHOS 246* 261* 235* 283* 232*  BILITOT 0.6 0.7 0.8 0.5 0.5  PROT 6.8 6.6 6.5 7.3 6.8  ALBUMIN 2.4* 2.4* 2.4* 2.6* 2.5*     Antibiotics: Anti-infectives (From admission, onward)   Start     Dose/Rate Route Frequency Ordered Stop   10-Mar-2021 1200  ceFAZolin (ANCEF) IVPB 2g/100 mL premix        2 g 200 mL/hr over 30 Minutes Intravenous  Once 02/11/21 1052     01/07/21 0800  ceFEPIme (MAXIPIME) 2 g  in sodium chloride 0.9 % 100 mL IVPB  Status:  Discontinued        2 g 200 mL/hr over 30 Minutes Intravenous Daily 01/06/21 1159 01/08/21 1257   01/05/21 1500  vancomycin (VANCOCIN) IVPB 1000 mg/200 mL premix  Status:  Discontinued        1,000 mg 200 mL/hr over 60 Minutes Intravenous Every 24 hours 01/05/21 1357 01/05/21 1414   01/05/21 1500  vancomycin (VANCOCIN) IVPB 1000 mg/200 mL premix  Status:  Discontinued        1,000 mg 200 mL/hr over 60 Minutes Intravenous Every 48 hours 01/05/21 1414 01/06/21 1146   01/05/21 1445  ceFEPIme (MAXIPIME) 2 g in sodium chloride 0.9 % 100 mL IVPB  Status:  Discontinued        2 g 200 mL/hr over 30 Minutes Intravenous Daily 01/05/21 1351 01/06/21 1146   01/03/21 1000  remdesivir 100 mg in sodium chloride 0.9 % 100 mL IVPB  Status:  Discontinued       "Followed by" Linked Group Details   100 mg 200 mL/hr over 30 Minutes Intravenous Daily 01/02/21 2257 01/04/21 1330   01/02/21 2300  remdesivir 200 mg in sodium chloride 0.9% 250 mL IVPB       "Followed by" Linked Group Details   200 mg 580 mL/hr over  30 Minutes Intravenous Once 01/02/21 2257 01/03/21 0147       DVT prophylaxis: Heparin  Code Status: Full code  Family Communication: Discussed with patient's sister at bedside   Consultants:    Procedures:      Objective   Vitals:   2021/03/05 0709 03/05/21 0744 03/05/2021 0858 2021/03/05 1237  BP: (!) 141/59  (!) 143/62 (!) 150/80  Pulse: 68  65 61  Resp: (!) 22  20 14   Temp: 98.1 F (36.7 C)  (!) 96.3 F (35.7 C) (!) 97.3 F (36.3 C)  TempSrc:   Rectal Rectal  SpO2: 100% 99% 98% 100%  Weight:      Height:        Intake/Output Summary (Last 24 hours) at 05-Mar-2021 1303 Last data filed at 05-Mar-2021 9379 Gross per 24 hour  Intake 250 ml  Output 950 ml  Net -700 ml    02/28 1901 - 03/02 0700 In: 895 [P.O.:120] Out: 1650 [Urine:1650]  Filed Weights   02/09/21 0500 02/11/21 0500 03-05-2021 0500  Weight: 85.8 kg 85.2 kg 83.3 kg    Physical Examination:   General-somnolent Heart-S1-S2, regular, no murmur auscultated Lungs-clear to auscultation bilaterally, no wheezing or crackles auscultated Abdomen-soft, nontender, no organomegaly Extremities-1+ pitting edema in the lower extremities Neuro-somnolent, barely opens eyes to verbal stimuli, not following commands  Status is: Inpatient  Dispo: The patient is from: Home              Anticipated d/c is to: Skilled nursing facility              Anticipated d/c date is: 02/17/2021              Patient currently not stable for discharge  Barrier to discharge-ongoing management for encephalopathy, poor p.o. intake  Pressure Injury Sacrum Mid red open skin (Active)     Location: Sacrum  Location Orientation: Mid  Staging:   Wound Description (Comments): red open skin  Present on Admission: Yes           Data Reviewed:   No results found for this or any previous visit (from the past 240 hour(s)).  No  results for input(s): LIPASE, AMYLASE in the last 168 hours. No results for input(s): AMMONIA in the  last 168 hours.  Cardiac Enzymes: No results for input(s): CKTOTAL, CKMB, CKMBINDEX, TROPONINI in the last 168 hours. BNP (last 3 results) Recent Labs    10/19/20 0001 01/02/21 1947  BNP 321.9* 536.1*    ProBNP (last 3 results) No results for input(s): PROBNP in the last 8760 hours.  Studies:  DG CHEST PORT 1 VIEW  Result Date: 02/20/21 CLINICAL DATA:  Shortness of breath. EXAM: PORTABLE CHEST 1 VIEW COMPARISON:  02/11/2021. FINDINGS: Feeding tube noted with tip below left hemidiaphragm. Cardiac pacer stable position. Cardiomegaly with diffuse bilateral interstitial prominence and small pleural effusions again noted. Findings most consistent with CHF. Pneumonitis cannot be excluded. No interim change. No pneumothorax. Surgical clips left chest. IMPRESSION: Cardiac pacer in stable position. Cardiomegaly with diffuse bilateral interstitial prominence and small pleural effusions again noted. Findings most consistent with CHF. Similar findings noted on prior exam. Electronically Signed   By: [employer]   On: Feb 20, 2021 05:38   DG CHEST PORT 1 VIEW  Result Date: 02/11/2021 CLINICAL DATA:  Shortness of breath, history breast cancer, stroke, hypertension, diabetes mellitus EXAM: PORTABLE CHEST 1 VIEW COMPARISON:  Portable exam 0459 hours compared to 02/09/2021 FINDINGS: Feeding tube extends into abdomen. RIGHT subclavian pacemaker with leads projecting over RIGHT atrium and RIGHT ventricle. Enlargement of cardiac silhouette with pulmonary vascular congestion. BILATERAL pulmonary infiltrates consistent with pulmonary edema. Bibasilar pleural effusions, small. No pneumothorax. Atherosclerotic calcification at aortic arch. Surgical clips at LEFT axilla. IMPRESSION: Persistent pulmonary edema and CHF. Aortic Atherosclerosis (ICD10-I70.0). Electronically Signed   By: Lavonia Dana M.D.   On: 02/11/2021 08:20       [place]   [facility] If 7PM-7AM, please contact night-coverage at  [email or web address], Office  [phone]   20-Feb-2021, 1:03 PM  LOS: 40 days

## 2021-03-14 NOTE — Progress Notes (Signed)
Nutrition Follow-up  DOCUMENTATION CODES:   Not applicable  INTERVENTION:  - once PEG placed and TF able to be resumed, continue Nepro @ 50 ml/hr with 200 ml free water QID.    NUTRITION DIAGNOSIS:   Increased nutrient needs related to acute illness (COVID-19 infection) as evidenced by estimated needs. -ongoing  GOAL:   Patient will meet greater than or equal to 90% of their needs -met with TF regimen  MONITOR:   Diet advancement,TF tolerance,Labs,Weight trends,Skin  ASSESSMENT:   75 year old female with past medical history significant for previous stroke with residual left-sided weakness 10/21, HTN, breast cancer 2017, expressive aphasia, dementia, CKD stage IV and diabetes who presents from her nursing facility due to altered mental status and was found to have hypernatremia as well as COVID-19.  Significant Events:  1/21- admission 1/27- TF initiation 2/9- TF formula changed from Osmolite 1.5 to Nepro 2/17- diet advanced from NPO to Dysphasia 1, thin liquids at noon 2/18- TF changed to be over 18 hours/day; Magic Cup and Ensure ordered 2/23- TF changed back to be over 24 hours/day; Ensure d/c'd     Patient laying in bed with eyes shut and no family or visitors present. Tube became clogged yesterday but was able to be unclogged with warm water. TF was resumed at ~0130 and were stopped ~0750.   Able to talk with  RN and she reports plan is for PEG around 1500 today.  Order in place for Nepro @ 50 ml/hr with 200 ml free water QID. This regimen provides 2160 kcal, 97 grams protein, and 1672 ml free water.   Weight has been fairly stable since 2/10. Non-pitting edema to BUE documented in the edema section of flow sheet.     Labs reviewed; CBGs: 64, 100, 110, 118 mg/dl, BUN: 113 mg/dl, creatinine: 2.93 mg/dl, Phos: 5.6 mg/dl, Alk Phos elevated, LFTs elevated, GFR: 16 ml/min.  Medications reviewed; 60 mg IV lasix BID, sliding scale novolog, 5 units lantus BID, 20 g  lactulose BID, 5 ml mycostatin QID, 40 mg protonix/day, 650 mg sodium bicarb/day.    Diet Order:   Diet Order            Diet NPO time specified  Diet effective midnight                 EDUCATION NEEDS:   Not appropriate for education at this time  Skin:  Skin Assessment: Reviewed RN Assessment  Last BM:  3/2 (type 6 x2)  Height:   Ht Readings from Last 1 Encounters:  01/03/21 5' 7"  (1.702 m)    Weight:   Wt Readings from Last 1 Encounters:  02-17-2021 83.3 kg    Estimated Nutritional Needs:  Kcal:  1800-2100kcal/day Protein:  90-105g/day Fluid:  1.6-1.8L/day       Jarome Matin, MS, RD, LDN, CNSC Inpatient Clinical Dietitian RD pager # available in [facility]  After hours/weekend pager # available in [facility]

## 2021-03-14 NOTE — Progress Notes (Signed)
Clarified orders with IR - suggest to not feed patient through night and keep PEG to LWS.  Ok to use PEG in AM

## 2021-03-14 NOTE — Procedures (Signed)
Interventional Radiology Procedure Note  Procedure: Placement of percutaneous 20F gastrostomy tube.  Complications: None  Recommendations: - NPO except for sips and chips remainder of today and overnight - Maintain G-tube to LWS until tomorrow morning  - May advance diet as tolerated and begin using tube tomorrow morning   Tamari Busic, MD Pager: [phone]    

## 2021-03-14 NOTE — Sedation Documentation (Signed)
No sedation required

## 2021-03-14 NOTE — Plan of Care (Signed)
  Problem: Coping: Goal: Level of anxiety will decrease Outcome: Progressing   Problem: Elimination: Goal: Will not experience complications related to bowel motility Outcome: Progressing Goal: Will not experience complications related to urinary retention Outcome: Progressing   Problem: Pain Managment: Goal: General experience of comfort will improve Outcome: Progressing   Problem: Safety: Goal: Ability to remain free from injury will improve Outcome: Progressing   Problem: Skin Integrity: Goal: Risk for impaired skin integrity will decrease Outcome: Progressing   Problem: Coping: Goal: Psychosocial and spiritual needs will be supported Outcome: Progressing   Problem: Respiratory: Goal: Will maintain a patent airway Outcome: Progressing Goal: Complications related to the disease process, condition or treatment will be avoided or minimized Outcome: Progressing   Problem: Coping: Goal: Psychosocial and spiritual needs will be supported Outcome: Progressing   Problem: Respiratory: Goal: Will maintain a patent airway Outcome: Progressing Goal: Complications related to the disease process, condition or treatment will be avoided or minimized Outcome: Progressing

## 2021-03-14 NOTE — TOC Progression Note (Signed)
Transition of Care [facility]) - Progression Note    Patient Details  Name: Washington MRN: [id number] Date of Birth: 1946-08-30  Transition of Care [facility]) CM/SW Contact  Ross Ludwig, [place] Phone Number: 03/10/2021, 6:49 PM  Clinical Narrative:     CSW spoke to patient's sister who was at bedside and updated her that once the peg tube is inserted, then CSW can refax patient's information to different facilities to see who has a bed available.  CSW was informed that patient's sister would prefer [place] if possible, and would like [facility].  CSW contacted admissions worker at [facility] and she said to refax patient's information once peg tube is inserted.  CSW to continue to follow patient's progress throughout discharge planning.     Expected Discharge Plan and Services  SNF once peg tube is inserted and patient medically ready for discharge.                                               Social Determinants of Health (SDOH) Interventions    Readmission Risk Interventions Readmission Risk Prevention Plan 01/13/2021  Transportation Screening Complete  PCP or Specialist Appt within 3-5 Days Complete  Social Work Consult for [place] Planning/Counseling Complete  Palliative Care Screening Complete  Medication Review [employer]) Referral to Pharmacy

## 2021-03-14 NOTE — Progress Notes (Signed)
The Dobhoff feeding tube was unclogged by using warm water. Tube feedings have resumed.

## 2021-03-14 NOTE — Progress Notes (Signed)
   02/11/21 2020  Assess: MEWS Score  Temp 98.5 F (36.9 C)  BP (!) 120/46  Pulse Rate (!) 59  Resp (!) 25  SpO2 100 %  Assess: MEWS Score  MEWS Temp 0  MEWS Systolic 0  MEWS Pulse 0  MEWS RR 1  MEWS LOC 1  MEWS Score 2  MEWS Score Color Yellow  Assess: if the MEWS score is Yellow or Red  Were vital signs taken at a resting state? Yes  Focused Assessment No change from prior assessment  Early Detection of Sepsis Score *See Row Information* Low  MEWS guidelines implemented *See Row Information* No, previously yellow, continue vital signs every 4 hours  Notify: Charge Nurse/RN  Name of Charge Nurse/RN Notified Mechele Claude, RN  Date Charge Nurse/RN Notified 02/11/21  Time Charge Nurse/RN Notified 2050

## 2021-03-14 DEATH — deceased

## 2021-03-27 ENCOUNTER — Ambulatory Visit: Payer: Medicare HMO | Admitting: Adult Health
